# Patient Record
Sex: Female | Born: 1955 | Race: White | Hispanic: No | Smoking: Never smoker
Health system: Southern US, Community
[De-identification: ages and names within clinical notes are randomized; demographics above are authoritative.]

## PROBLEM LIST (undated history)

## (undated) DIAGNOSIS — E119 Type 2 diabetes mellitus without complications: Secondary | ICD-10-CM

## (undated) DIAGNOSIS — N189 Chronic kidney disease, unspecified: Secondary | ICD-10-CM

## (undated) DIAGNOSIS — I10 Essential (primary) hypertension: Secondary | ICD-10-CM

## (undated) HISTORY — PX: EYE SURGERY: SHX253

---

## 2011-09-02 ENCOUNTER — Encounter: Payer: Self-pay | Admitting: Cardiothoracic Surgery

## 2011-09-02 ENCOUNTER — Encounter: Payer: Self-pay | Admitting: Nurse Practitioner

## 2011-09-13 ENCOUNTER — Encounter: Payer: Self-pay | Admitting: Nurse Practitioner

## 2011-09-13 ENCOUNTER — Encounter: Payer: Self-pay | Admitting: Cardiothoracic Surgery

## 2011-10-14 ENCOUNTER — Encounter: Payer: Self-pay | Admitting: Nurse Practitioner

## 2011-10-14 ENCOUNTER — Encounter: Payer: Self-pay | Admitting: Cardiothoracic Surgery

## 2011-10-28 ENCOUNTER — Inpatient Hospital Stay: Payer: Self-pay | Admitting: Internal Medicine

## 2011-10-28 LAB — BASIC METABOLIC PANEL
Anion Gap: 18 — ABNORMAL HIGH (ref 7–16)
Anion Gap: 18 — ABNORMAL HIGH (ref 7–16)
Anion Gap: 18 — ABNORMAL HIGH (ref 7–16)
Anion Gap: 18 — ABNORMAL HIGH (ref 7–16)
BUN: 105 mg/dL — ABNORMAL HIGH (ref 7–18)
BUN: 89 mg/dL — ABNORMAL HIGH (ref 7–18)
Calcium, Total: 7.5 mg/dL — ABNORMAL LOW (ref 8.5–10.1)
Calcium, Total: 7.1 mg/dL — ABNORMAL LOW (ref 8.5–10.1)
Calcium, Total: 7.2 mg/dL — ABNORMAL LOW (ref 8.5–10.1)
Calcium, Total: 7.4 mg/dL — ABNORMAL LOW (ref 8.5–10.1)
Chloride: 115 mmol/L — ABNORMAL HIGH (ref 98–107)
Chloride: 110 mmol/L — ABNORMAL HIGH (ref 98–107)
Co2: 9 mmol/L — CL (ref 21–32)
Co2: 10 mmol/L — CL (ref 21–32)
Co2: 10 mmol/L — CL (ref 21–32)
Creatinine: 6.89 mg/dL — ABNORMAL HIGH (ref 0.60–1.30)
EGFR (African American): 7 — ABNORMAL LOW
EGFR (African American): 7 — ABNORMAL LOW
EGFR (Non-African Amer.): 5 — ABNORMAL LOW
EGFR (Non-African Amer.): 6 — ABNORMAL LOW
EGFR (Non-African Amer.): 6 — ABNORMAL LOW
EGFR (Non-African Amer.): 6 — ABNORMAL LOW
Glucose: 117 mg/dL — ABNORMAL HIGH (ref 65–99)
Glucose: 126 mg/dL — ABNORMAL HIGH (ref 65–99)
Osmolality: 311 (ref 275–301)
Osmolality: 312 (ref 275–301)
Potassium: 7.6 mmol/L (ref 3.5–5.1)
Potassium: 6 mmol/L — ABNORMAL HIGH (ref 3.5–5.1)
Potassium: 5.5 mmol/L — ABNORMAL HIGH (ref 3.5–5.1)
Potassium: 5.6 mmol/L — ABNORMAL HIGH (ref 3.5–5.1)
Sodium: 140 mmol/L (ref 136–145)
Sodium: 137 mmol/L (ref 136–145)

## 2011-10-28 LAB — URINALYSIS, COMPLETE
Bacteria: NONE SEEN
Glucose,UR: 50 mg/dL (ref 0–75)
Nitrite: NEGATIVE
Ph: 5 (ref 4.5–8.0)
Protein: 30
RBC,UR: <1 /HPF (ref 0–5)
Squamous Epithelial: NONE SEEN

## 2011-10-28 LAB — TROPONIN I: Troponin-I: <0.02 ng/mL

## 2011-10-28 LAB — CBC
HCT: 37.7 % (ref 35.0–47.0)
HGB: 11.5 g/dL — ABNORMAL LOW (ref 12.0–16.0)
MCH: 28.3 pg (ref 26.0–34.0)
MCHC: 30.6 g/dL — ABNORMAL LOW (ref 32.0–36.0)
MCV: 93 fL (ref 80–100)
Platelet: 413 10*3/uL (ref 150–440)
RBC: 4.07 10*6/uL (ref 3.80–5.20)
RDW: 14.8 % — ABNORMAL HIGH (ref 11.5–14.5)
WBC: 15.5 10*3/uL — ABNORMAL HIGH (ref 3.6–11.0)

## 2011-10-28 LAB — COMPREHENSIVE METABOLIC PANEL
Albumin: 3 g/dL — ABNORMAL LOW (ref 3.4–5.0)
Alkaline Phosphatase: 101 U/L (ref 50–136)
Bilirubin,Total: 0.2 mg/dL (ref 0.2–1.0)
Calcium, Total: 8 mg/dL — ABNORMAL LOW (ref 8.5–10.1)
Chloride: 111 mmol/L — ABNORMAL HIGH (ref 98–107)
Creatinine: 8.51 mg/dL — ABNORMAL HIGH (ref 0.60–1.30)
EGFR (African American): 5 — ABNORMAL LOW
EGFR (Non-African Amer.): 5 — ABNORMAL LOW
Glucose: 131 mg/dL — ABNORMAL HIGH (ref 65–99)
Potassium: 7.8 mmol/L (ref 3.5–5.1)
SGPT (ALT): 13 U/L
Sodium: 138 mmol/L (ref 136–145)
Total Protein: 7.5 g/dL (ref 6.4–8.2)

## 2011-10-28 LAB — WOUND CULTURE

## 2011-10-29 LAB — BASIC METABOLIC PANEL
Anion Gap: 17 — ABNORMAL HIGH (ref 7–16)
Anion Gap: 19 — ABNORMAL HIGH (ref 7–16)
BUN: 76 mg/dL — ABNORMAL HIGH (ref 7–18)
BUN: 78 mg/dL — ABNORMAL HIGH (ref 7–18)
BUN: 79 mg/dL — ABNORMAL HIGH (ref 7–18)
BUN: 82 mg/dL — ABNORMAL HIGH (ref 7–18)
BUN: 85 mg/dL — ABNORMAL HIGH (ref 7–18)
Calcium, Total: 7.1 mg/dL — ABNORMAL LOW (ref 8.5–10.1)
Calcium, Total: 7.2 mg/dL — ABNORMAL LOW (ref 8.5–10.1)
Calcium, Total: 7.3 mg/dL — ABNORMAL LOW (ref 8.5–10.1)
Calcium, Total: 7.3 mg/dL — ABNORMAL LOW (ref 8.5–10.1)
Calcium, Total: 7.3 mg/dL — ABNORMAL LOW (ref 8.5–10.1)
Chloride: 110 mmol/L — ABNORMAL HIGH (ref 98–107)
Chloride: 111 mmol/L — ABNORMAL HIGH (ref 98–107)
Chloride: 112 mmol/L — ABNORMAL HIGH (ref 98–107)
Chloride: 112 mmol/L — ABNORMAL HIGH (ref 98–107)
Chloride: 113 mmol/L — ABNORMAL HIGH (ref 98–107)
Co2: 11 mmol/L — ABNORMAL LOW (ref 21–32)
Co2: 12 mmol/L — ABNORMAL LOW (ref 21–32)
Co2: 13 mmol/L — ABNORMAL LOW (ref 21–32)
Co2: 14 mmol/L — ABNORMAL LOW (ref 21–32)
Creatinine: 5.17 mg/dL — ABNORMAL HIGH (ref 0.60–1.30)
Creatinine: 5.77 mg/dL — ABNORMAL HIGH (ref 0.60–1.30)
Creatinine: 6.08 mg/dL — ABNORMAL HIGH (ref 0.60–1.30)
Creatinine: 6.29 mg/dL — ABNORMAL HIGH (ref 0.60–1.30)
EGFR (African American): 10 — ABNORMAL LOW
EGFR (African American): 8 — ABNORMAL LOW
EGFR (African American): 8 — ABNORMAL LOW
EGFR (African American): 8 — ABNORMAL LOW
EGFR (African American): 9 — ABNORMAL LOW
EGFR (Non-African Amer.): 8 — ABNORMAL LOW
EGFR (Non-African Amer.): 8 — ABNORMAL LOW
EGFR (Non-African Amer.): 7 — ABNORMAL LOW
Glucose: 147 mg/dL — ABNORMAL HIGH (ref 65–99)
Glucose: 171 mg/dL — ABNORMAL HIGH (ref 65–99)
Glucose: 182 mg/dL — ABNORMAL HIGH (ref 65–99)
Glucose: 202 mg/dL — ABNORMAL HIGH (ref 65–99)
Osmolality: 305 (ref 275–301)
Osmolality: 306 (ref 275–301)
Osmolality: 306 (ref 275–301)
Osmolality: 308 (ref 275–301)
Osmolality: 310 (ref 275–301)
Osmolality: 312 (ref 275–301)
Potassium: 4.3 mmol/L (ref 3.5–5.1)
Potassium: 4.3 mmol/L (ref 3.5–5.1)
Potassium: 4.5 mmol/L (ref 3.5–5.1)
Potassium: 4.6 mmol/L (ref 3.5–5.1)
Potassium: 4.9 mmol/L (ref 3.5–5.1)
Sodium: 141 mmol/L (ref 136–145)
Sodium: 142 mmol/L (ref 136–145)
Sodium: 139 mmol/L (ref 136–145)
Sodium: 141 mmol/L (ref 136–145)
Sodium: 140 mmol/L (ref 136–145)
Sodium: 142 mmol/L (ref 136–145)

## 2011-10-29 LAB — PROTEIN / CREATININE RATIO, URINE
Creatinine, Urine: 93.8 mg/dL (ref 30.0–125.0)
Protein/Creat. Ratio: 864 mg/gCREAT — ABNORMAL HIGH (ref 0–200)

## 2011-10-29 LAB — CBC WITH DIFFERENTIAL/PLATELET
Basophil %: 0.1 %
Eosinophil #: 0 10*3/uL (ref 0.0–0.7)
Eosinophil %: 0.1 %
HCT: 33 % — ABNORMAL LOW (ref 35.0–47.0)
HGB: 10.3 g/dL — ABNORMAL LOW (ref 12.0–16.0)
Lymphocyte %: 4.4 %
MCV: 90 fL (ref 80–100)
Monocyte #: 1.3 x10 3/mm — ABNORMAL HIGH (ref 0.2–0.9)
Monocyte %: 7 %
Neutrophil #: 17 10*3/uL — ABNORMAL HIGH (ref 1.4–6.5)
RDW: 14.2 % (ref 11.5–14.5)
WBC: 19.2 10*3/uL — ABNORMAL HIGH (ref 3.6–11.0)

## 2011-10-29 LAB — URINALYSIS, COMPLETE
Glucose,UR: NEGATIVE mg/dL (ref 0–75)
Nitrite: NEGATIVE
Ph: 5 (ref 4.5–8.0)
Protein: 30
RBC,UR: 8 /HPF (ref 0–5)
Specific Gravity: 1.012 (ref 1.003–1.030)

## 2011-10-29 LAB — LIPID PANEL
Cholesterol: 125 mg/dL (ref 0–200)
HDL Cholesterol: 20 mg/dL — ABNORMAL LOW (ref 40–60)
Ldl Cholesterol, Calc: 69 mg/dL (ref 0–100)

## 2011-10-29 LAB — TROPONIN I: Troponin-I: <0.02 ng/mL

## 2011-10-30 LAB — BASIC METABOLIC PANEL
Anion Gap: 10 (ref 7–16)
BUN: 59 mg/dL — ABNORMAL HIGH (ref 7–18)
BUN: 38 mg/dL — ABNORMAL HIGH (ref 7–18)
BUN: 32 mg/dL — ABNORMAL HIGH (ref 7–18)
BUN: 31 mg/dL — ABNORMAL HIGH (ref 7–18)
Calcium, Total: 7.3 mg/dL — ABNORMAL LOW (ref 8.5–10.1)
Calcium, Total: 7.6 mg/dL — ABNORMAL LOW (ref 8.5–10.1)
Calcium, Total: 7.8 mg/dL — ABNORMAL LOW (ref 8.5–10.1)
Calcium, Total: 7.5 mg/dL — ABNORMAL LOW (ref 8.5–10.1)
Chloride: 108 mmol/L — ABNORMAL HIGH (ref 98–107)
Chloride: 103 mmol/L (ref 98–107)
Chloride: 104 mmol/L (ref 98–107)
Chloride: 106 mmol/L (ref 98–107)
Chloride: 108 mmol/L — ABNORMAL HIGH (ref 98–107)
Co2: 22 mmol/L (ref 21–32)
Co2: 24 mmol/L (ref 21–32)
Co2: 27 mmol/L (ref 21–32)
Co2: 27 mmol/L (ref 21–32)
Creatinine: 4.97 mg/dL — ABNORMAL HIGH (ref 0.60–1.30)
Creatinine: 3.65 mg/dL — ABNORMAL HIGH (ref 0.60–1.30)
Creatinine: 3.08 mg/dL — ABNORMAL HIGH (ref 0.60–1.30)
Creatinine: 3.03 mg/dL — ABNORMAL HIGH (ref 0.60–1.30)
EGFR (African American): 10 — ABNORMAL LOW
EGFR (African American): 13 — ABNORMAL LOW
EGFR (African American): 15 — ABNORMAL LOW
EGFR (African American): 16 — ABNORMAL LOW
EGFR (African American): 19 — ABNORMAL LOW
EGFR (African American): 19 — ABNORMAL LOW
EGFR (Non-African Amer.): 11 — ABNORMAL LOW
EGFR (Non-African Amer.): 14 — ABNORMAL LOW
EGFR (Non-African Amer.): 16 — ABNORMAL LOW
EGFR (Non-African Amer.): 16 — ABNORMAL LOW
Glucose: 201 mg/dL — ABNORMAL HIGH (ref 65–99)
Glucose: 108 mg/dL — ABNORMAL HIGH (ref 65–99)
Glucose: 140 mg/dL — ABNORMAL HIGH (ref 65–99)
Osmolality: 306 (ref 275–301)
Osmolality: 291 (ref 275–301)
Osmolality: 291 (ref 275–301)
Osmolality: 289 (ref 275–301)
Potassium: 4.2 mmol/L (ref 3.5–5.1)
Potassium: 4 mmol/L (ref 3.5–5.1)
Sodium: 142 mmol/L (ref 136–145)
Sodium: 141 mmol/L (ref 136–145)

## 2011-10-30 LAB — CBC WITH DIFFERENTIAL/PLATELET
Basophil %: 0.7 %
Eosinophil #: 0.1 10*3/uL (ref 0.0–0.7)
HCT: 29.8 % — ABNORMAL LOW (ref 35.0–47.0)
Lymphocyte #: 1.9 10*3/uL (ref 1.0–3.6)
MCH: 28.4 pg (ref 26.0–34.0)
Monocyte #: 1.6 x10 3/mm — ABNORMAL HIGH (ref 0.2–0.9)
Neutrophil %: 74.4 %
Platelet: 244 10*3/uL (ref 150–440)
WBC: 14.5 10*3/uL — ABNORMAL HIGH (ref 3.6–11.0)

## 2011-10-30 LAB — URINE CULTURE

## 2011-10-30 LAB — VANCOMYCIN, TROUGH: Vancomycin, Trough: 12 ug/mL (ref 10–20)

## 2011-10-30 LAB — PROTEIN ELECTROPHORESIS(ARMC)

## 2011-10-31 LAB — BASIC METABOLIC PANEL
Anion Gap: 7 (ref 7–16)
Anion Gap: 9 (ref 7–16)
BUN: 29 mg/dL — ABNORMAL HIGH (ref 7–18)
BUN: 34 mg/dL — ABNORMAL HIGH (ref 7–18)
Calcium, Total: 7.6 mg/dL — ABNORMAL LOW (ref 8.5–10.1)
Calcium, Total: 7.8 mg/dL — ABNORMAL LOW (ref 8.5–10.1)
Calcium, Total: 7.7 mg/dL — ABNORMAL LOW (ref 8.5–10.1)
Calcium, Total: 7.4 mg/dL — ABNORMAL LOW (ref 8.5–10.1)
Chloride: 106 mmol/L (ref 98–107)
Chloride: 106 mmol/L (ref 98–107)
Co2: 26 mmol/L (ref 21–32)
Co2: 26 mmol/L (ref 21–32)
Creatinine: 3.08 mg/dL — ABNORMAL HIGH (ref 0.60–1.30)
Creatinine: 3.05 mg/dL — ABNORMAL HIGH (ref 0.60–1.30)
Creatinine: 3.33 mg/dL — ABNORMAL HIGH (ref 0.60–1.30)
Creatinine: 3.71 mg/dL — ABNORMAL HIGH (ref 0.60–1.30)
EGFR (African American): 19 — ABNORMAL LOW
EGFR (African American): 17 — ABNORMAL LOW
EGFR (African American): 15 — ABNORMAL LOW
EGFR (Non-African Amer.): 16 — ABNORMAL LOW
EGFR (Non-African Amer.): 15 — ABNORMAL LOW
EGFR (Non-African Amer.): 13 — ABNORMAL LOW
Glucose: 109 mg/dL — ABNORMAL HIGH (ref 65–99)
Glucose: 97 mg/dL (ref 65–99)
Osmolality: 288 (ref 275–301)
Osmolality: 295 (ref 275–301)
Potassium: 3.8 mmol/L (ref 3.5–5.1)
Potassium: 4 mmol/L (ref 3.5–5.1)
Potassium: 4.1 mmol/L (ref 3.5–5.1)
Sodium: 140 mmol/L (ref 136–145)
Sodium: 140 mmol/L (ref 136–145)
Sodium: 141 mmol/L (ref 136–145)

## 2011-10-31 LAB — CBC WITH DIFFERENTIAL/PLATELET
Basophil #: 0.1 10*3/uL (ref 0.0–0.1)
Basophil %: 0.7 %
Eosinophil #: 0.2 10*3/uL (ref 0.0–0.7)
Eosinophil %: 1.4 %
HCT: 26.7 % — ABNORMAL LOW (ref 35.0–47.0)
Lymphocyte #: 2.5 10*3/uL (ref 1.0–3.6)
MCHC: 32.1 g/dL (ref 32.0–36.0)
MCV: 88 fL (ref 80–100)
Monocyte %: 10.8 %
Neutrophil #: 8.3 10*3/uL — ABNORMAL HIGH (ref 1.4–6.5)
Neutrophil %: 67 %
RDW: 14.2 % (ref 11.5–14.5)
WBC: 12.4 10*3/uL — ABNORMAL HIGH (ref 3.6–11.0)

## 2011-10-31 LAB — UR PROT ELECTROPHORESIS, URINE RANDOM

## 2011-11-01 LAB — CBC WITH DIFFERENTIAL/PLATELET
Basophil #: 0.1 10*3/uL (ref 0.0–0.1)
Eosinophil %: 3.4 %
Lymphocyte #: 2.9 10*3/uL (ref 1.0–3.6)
Lymphocyte %: 22.4 %
MCH: 28.1 pg (ref 26.0–34.0)
MCHC: 31.7 g/dL — ABNORMAL LOW (ref 32.0–36.0)
MCV: 89 fL (ref 80–100)
Monocyte #: 1.5 x10 3/mm — ABNORMAL HIGH (ref 0.2–0.9)
Neutrophil %: 61.8 %
RBC: 2.86 10*6/uL — ABNORMAL LOW (ref 3.80–5.20)
RDW: 14.2 % (ref 11.5–14.5)
WBC: 13 10*3/uL — ABNORMAL HIGH (ref 3.6–11.0)

## 2011-11-01 LAB — BASIC METABOLIC PANEL
BUN: 36 mg/dL — ABNORMAL HIGH (ref 7–18)
Calcium, Total: 7.7 mg/dL — ABNORMAL LOW (ref 8.5–10.1)
Chloride: 107 mmol/L (ref 98–107)
Co2: 25 mmol/L (ref 21–32)
Creatinine: 4.22 mg/dL — ABNORMAL HIGH (ref 0.60–1.30)
EGFR (Non-African Amer.): 11 — ABNORMAL LOW
Osmolality: 293 (ref 275–301)

## 2011-11-01 LAB — PHOSPHORUS: Phosphorus: 4.5 mg/dL (ref 2.5–4.9)

## 2011-11-02 LAB — BASIC METABOLIC PANEL
Anion Gap: 10 (ref 7–16)
BUN: 23 mg/dL — ABNORMAL HIGH (ref 7–18)
Calcium, Total: 7.4 mg/dL — ABNORMAL LOW (ref 8.5–10.1)
Chloride: 103 mmol/L (ref 98–107)
Co2: 28 mmol/L (ref 21–32)
Glucose: 119 mg/dL — ABNORMAL HIGH (ref 65–99)
Osmolality: 286 (ref 275–301)
Sodium: 141 mmol/L (ref 136–145)

## 2011-11-02 LAB — OCCULT BLOOD X 1 CARD TO LAB, STOOL: Occult Blood, Feces: NEGATIVE

## 2011-11-02 LAB — CBC WITH DIFFERENTIAL/PLATELET
Basophil #: 0.1 10*3/uL (ref 0.0–0.1)
Eosinophil #: 0.6 10*3/uL (ref 0.0–0.7)
Eosinophil %: 4.8 %
HCT: 24.9 % — ABNORMAL LOW (ref 35.0–47.0)
HGB: 7.9 g/dL — ABNORMAL LOW (ref 12.0–16.0)
Lymphocyte %: 18.5 %
MCV: 89 fL (ref 80–100)
Monocyte #: 1.4 x10 3/mm — ABNORMAL HIGH (ref 0.2–0.9)
Monocyte %: 10.7 %
Neutrophil #: 8.3 10*3/uL — ABNORMAL HIGH (ref 1.4–6.5)
Platelet: 208 10*3/uL (ref 150–440)
RDW: 13.4 % (ref 11.5–14.5)

## 2011-11-03 LAB — BASIC METABOLIC PANEL
Anion Gap: 9 (ref 7–16)
Anion Gap: 10 (ref 7–16)
Calcium, Total: 7.6 mg/dL — ABNORMAL LOW (ref 8.5–10.1)
Calcium, Total: 8 mg/dL — ABNORMAL LOW (ref 8.5–10.1)
Chloride: 103 mmol/L (ref 98–107)
Chloride: 103 mmol/L (ref 98–107)
Co2: 28 mmol/L (ref 21–32)
Creatinine: 5.04 mg/dL — ABNORMAL HIGH (ref 0.60–1.30)
EGFR (Non-African Amer.): 10 — ABNORMAL LOW
Glucose: 118 mg/dL — ABNORMAL HIGH (ref 65–99)
Glucose: 179 mg/dL — ABNORMAL HIGH (ref 65–99)
Potassium: 3.5 mmol/L (ref 3.5–5.1)
Potassium: 3.9 mmol/L (ref 3.5–5.1)

## 2011-11-03 LAB — CULTURE, BLOOD (SINGLE)

## 2011-11-03 LAB — HEMOGLOBIN: HGB: 7.5 g/dL — ABNORMAL LOW (ref 12.0–16.0)

## 2011-11-04 LAB — BASIC METABOLIC PANEL
BUN: 33 mg/dL — ABNORMAL HIGH (ref 7–18)
Chloride: 103 mmol/L (ref 98–107)
Glucose: 117 mg/dL — ABNORMAL HIGH (ref 65–99)
Osmolality: 290 (ref 275–301)
Potassium: 3.5 mmol/L (ref 3.5–5.1)
Sodium: 141 mmol/L (ref 136–145)

## 2011-11-13 ENCOUNTER — Encounter: Payer: Self-pay | Admitting: Nurse Practitioner

## 2011-11-13 ENCOUNTER — Encounter: Payer: Self-pay | Admitting: Cardiothoracic Surgery

## 2011-12-14 ENCOUNTER — Encounter: Payer: Self-pay | Admitting: Nurse Practitioner

## 2011-12-14 ENCOUNTER — Encounter: Payer: Self-pay | Admitting: Cardiothoracic Surgery

## 2012-01-13 ENCOUNTER — Encounter: Payer: Self-pay | Admitting: Cardiothoracic Surgery

## 2012-01-13 ENCOUNTER — Encounter: Payer: Self-pay | Admitting: Nurse Practitioner

## 2013-01-12 IMAGING — US US RENAL KIDNEY
1 series · 17 of 25 positions shown · non-contrast
Comparison: None

REASON FOR EXAM: acute renal failure
COMMENTS:

PROCEDURE:     US  - US KIDNEY  - October 29, 2011  [DATE]
RESULT:     Indication: Acute renal failure

[Series 1: us renal kidney · 17 of 28 slices shown]
[im 1/28]
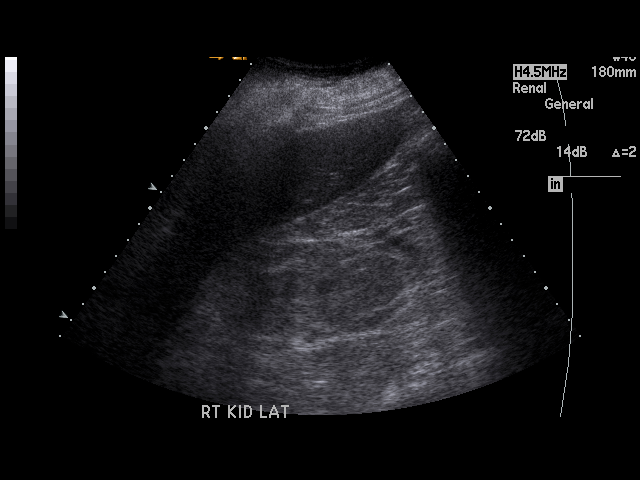
[im 3/28]
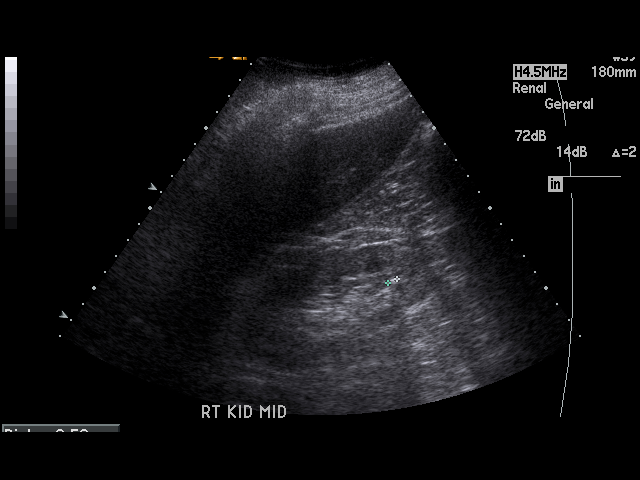
[im 4/28]
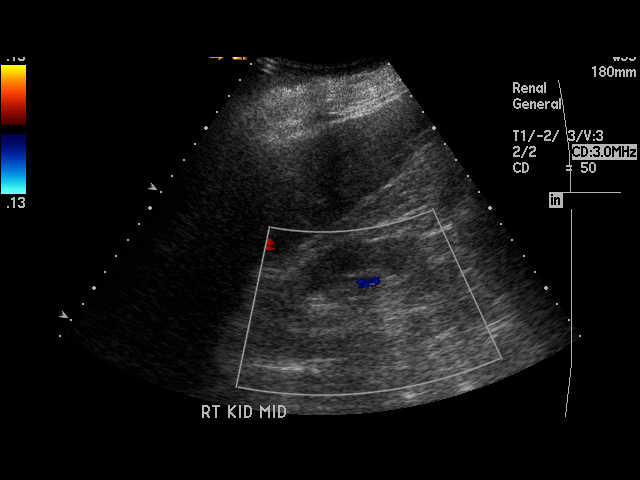
[im 6/28]
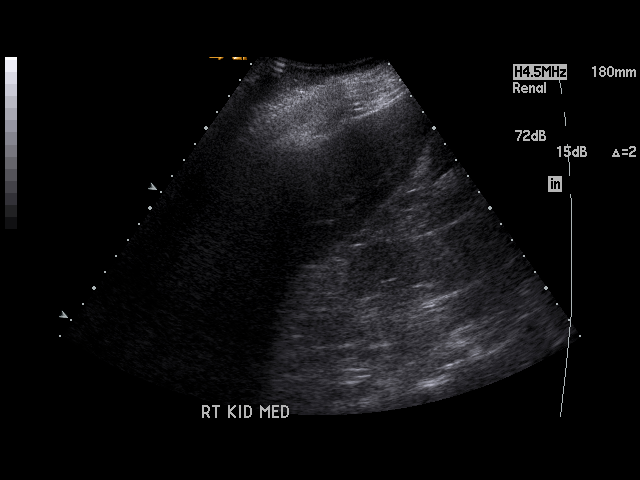
[im 7/28]
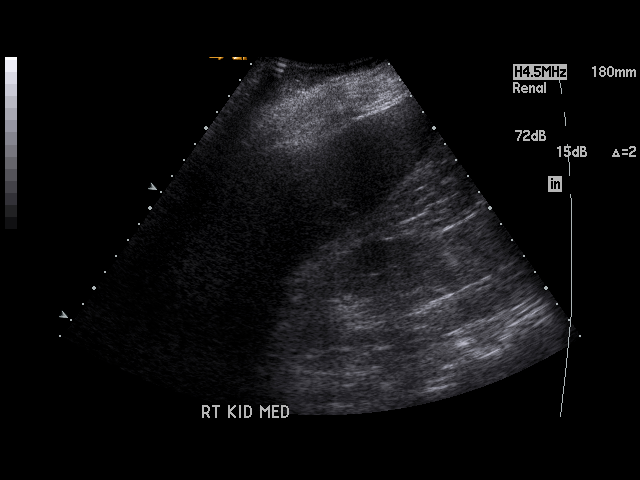
[im 10/28]
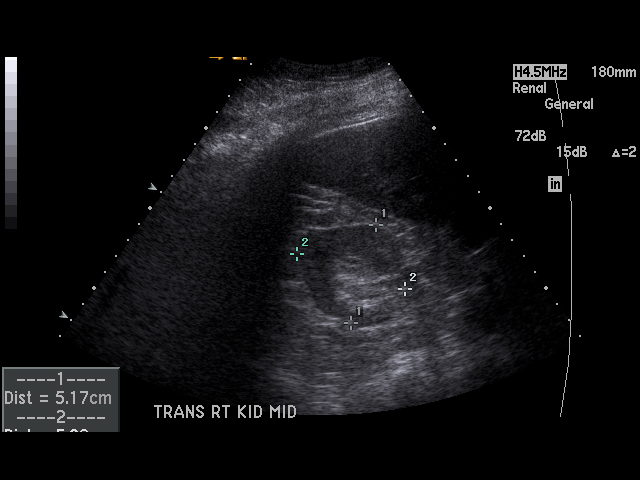
[im 11/28]
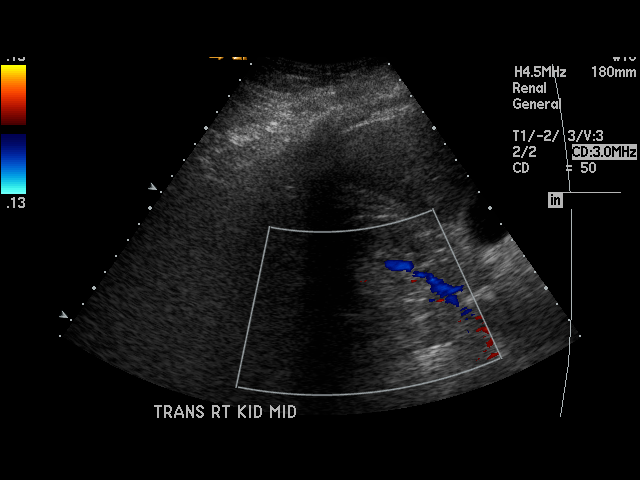
[im 13/28]
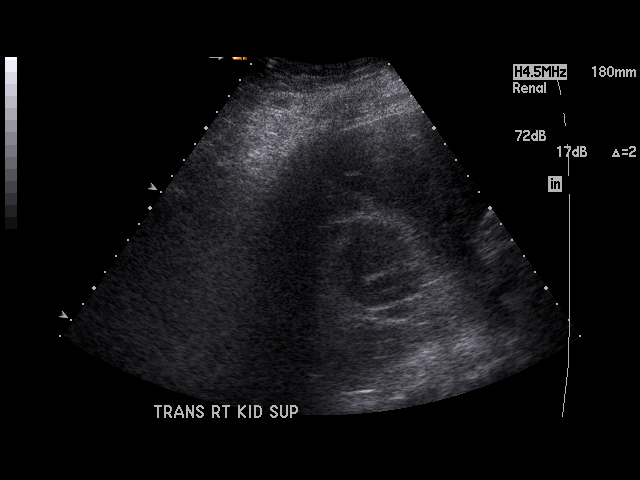
[im 14/28]
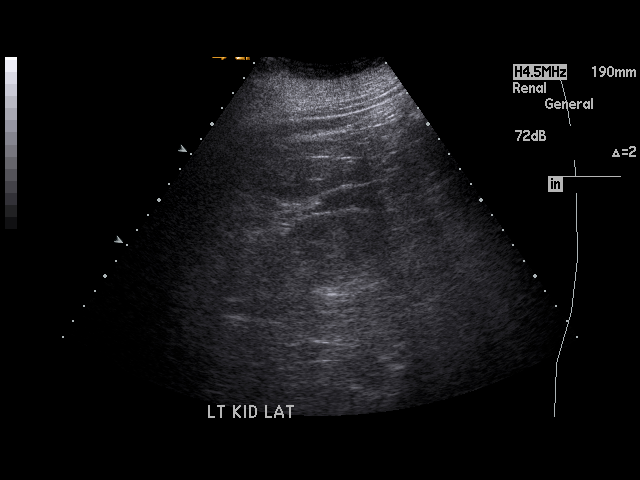
[im 15/28]
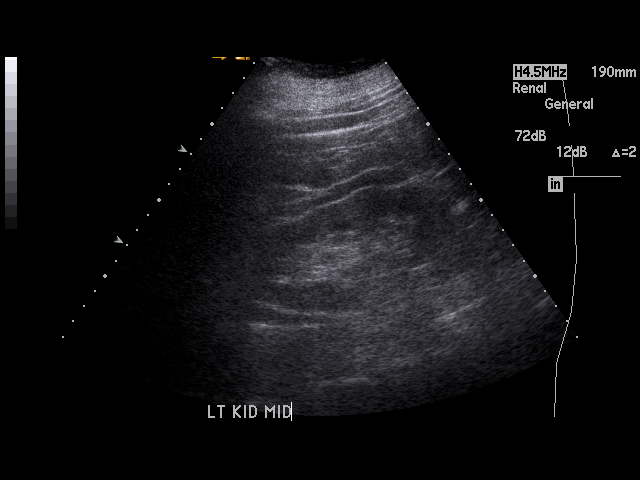
[im 17/28]
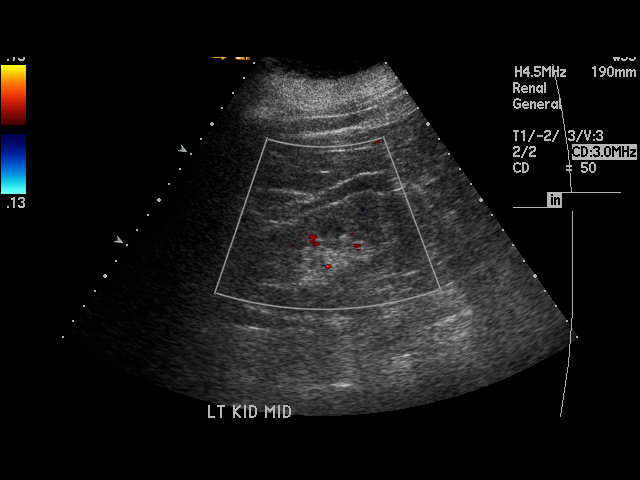
[im 19/28]
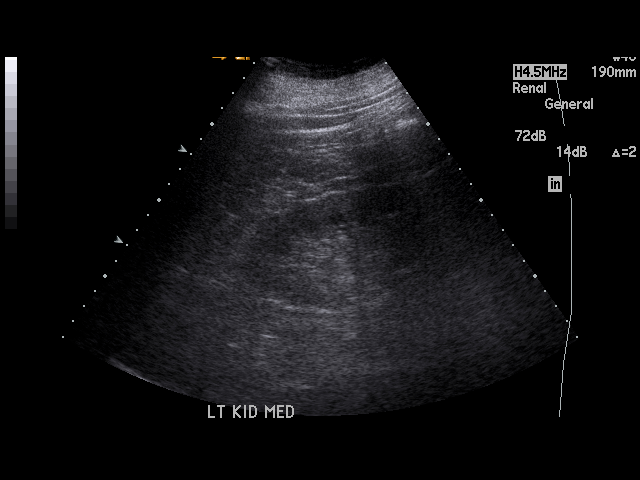
[im 21/28]
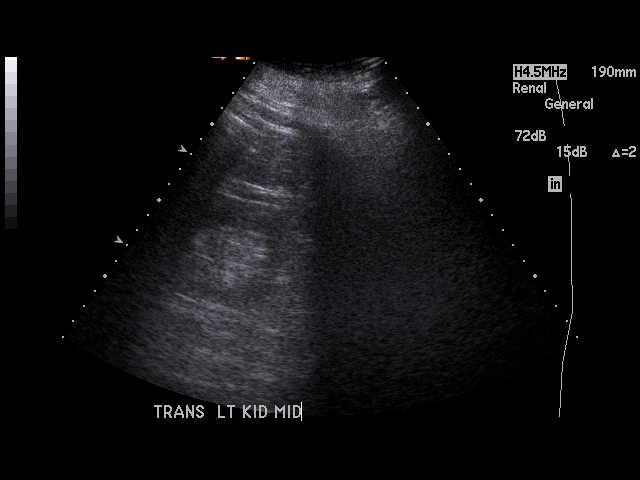
[im 22/28]
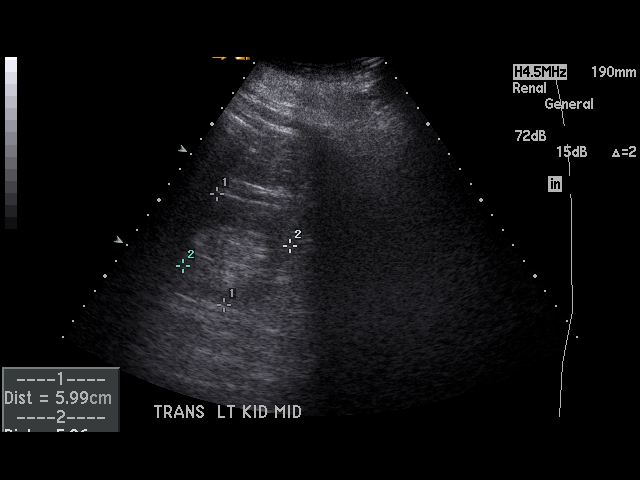
[im 24/28]
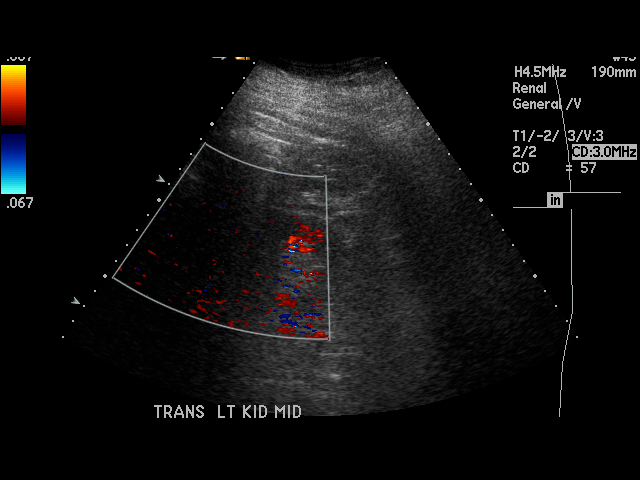
[im 25/28]
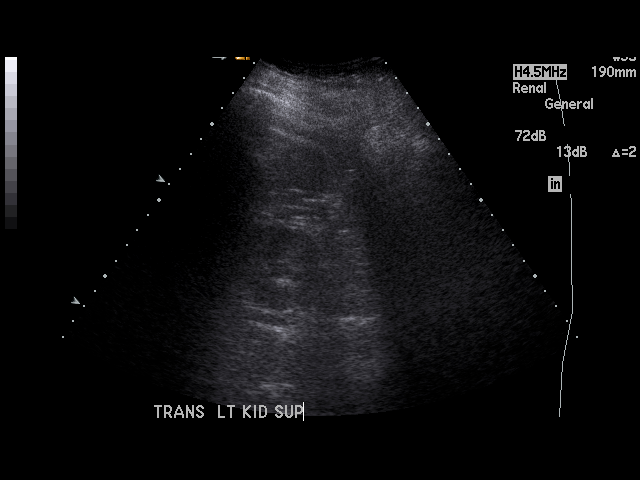
[im 28/28]
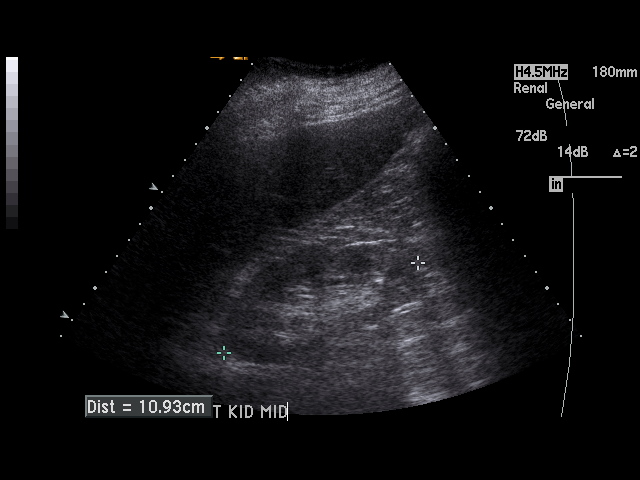

[17 of 25 positions shown; findings below may reference images not displayed]

Technique and findings: Multiple gray-scale and color doppler images of the
kidneys were obtained.

The right kidney measures 11 x 5.2 x 5.8 cm and the left kidney measures
11.6 x 6 x 5.9 cm. The kidneys are normal in echogenicity. There is no
hydronephrosis.  There are no echogenic foci.  There are no renal masses.
There is no free fluid in the region of the renal fossa.
IMPRESSION: Normal renal ultrasound.

## 2013-01-26 ENCOUNTER — Ambulatory Visit: Payer: Self-pay | Admitting: Ophthalmology

## 2013-02-01 ENCOUNTER — Ambulatory Visit: Payer: Self-pay | Admitting: Ophthalmology

## 2013-02-15 ENCOUNTER — Ambulatory Visit: Payer: Self-pay | Admitting: Ophthalmology

## 2014-04-14 DIAGNOSIS — N2581 Secondary hyperparathyroidism of renal origin: Secondary | ICD-10-CM | POA: Diagnosis not present

## 2014-04-14 DIAGNOSIS — I10 Essential (primary) hypertension: Secondary | ICD-10-CM | POA: Diagnosis not present

## 2014-04-14 DIAGNOSIS — E1122 Type 2 diabetes mellitus with diabetic chronic kidney disease: Secondary | ICD-10-CM | POA: Diagnosis not present

## 2014-04-14 DIAGNOSIS — N184 Chronic kidney disease, stage 4 (severe): Secondary | ICD-10-CM | POA: Diagnosis not present

## 2014-06-21 DIAGNOSIS — N184 Chronic kidney disease, stage 4 (severe): Secondary | ICD-10-CM | POA: Diagnosis not present

## 2014-06-21 DIAGNOSIS — E1122 Type 2 diabetes mellitus with diabetic chronic kidney disease: Secondary | ICD-10-CM | POA: Diagnosis not present

## 2014-06-21 DIAGNOSIS — I10 Essential (primary) hypertension: Secondary | ICD-10-CM | POA: Diagnosis not present

## 2014-06-21 DIAGNOSIS — N2581 Secondary hyperparathyroidism of renal origin: Secondary | ICD-10-CM | POA: Diagnosis not present

## 2014-06-21 DIAGNOSIS — E872 Acidosis: Secondary | ICD-10-CM | POA: Diagnosis not present

## 2014-08-16 DIAGNOSIS — N2581 Secondary hyperparathyroidism of renal origin: Secondary | ICD-10-CM | POA: Diagnosis not present

## 2014-08-16 DIAGNOSIS — E1122 Type 2 diabetes mellitus with diabetic chronic kidney disease: Secondary | ICD-10-CM | POA: Diagnosis not present

## 2014-08-16 DIAGNOSIS — N184 Chronic kidney disease, stage 4 (severe): Secondary | ICD-10-CM | POA: Diagnosis not present

## 2014-08-16 DIAGNOSIS — I10 Essential (primary) hypertension: Secondary | ICD-10-CM | POA: Diagnosis not present

## 2014-08-16 DIAGNOSIS — E872 Acidosis: Secondary | ICD-10-CM | POA: Diagnosis not present

## 2014-09-29 DIAGNOSIS — D631 Anemia in chronic kidney disease: Secondary | ICD-10-CM | POA: Diagnosis not present

## 2014-09-29 DIAGNOSIS — E872 Acidosis: Secondary | ICD-10-CM | POA: Diagnosis not present

## 2014-09-29 DIAGNOSIS — E1122 Type 2 diabetes mellitus with diabetic chronic kidney disease: Secondary | ICD-10-CM | POA: Diagnosis not present

## 2014-09-29 DIAGNOSIS — N2581 Secondary hyperparathyroidism of renal origin: Secondary | ICD-10-CM | POA: Diagnosis not present

## 2014-09-29 DIAGNOSIS — N185 Chronic kidney disease, stage 5: Secondary | ICD-10-CM | POA: Diagnosis not present

## 2014-11-04 NOTE — Op Note (Signed)
PATIENT NAME:  [name], [name] MR#:  [id number] DATE OF BIRTH:  [birth year]  DATE OF PROCEDURE:  02/01/2013  PREOPERATIVE DIAGNOSIS:  Cataract, right eye.   POSTOPERATIVE DIAGNOSIS:  Cataract, right eye.  PROCEDURE PERFORMED:  Extracapsular cataract extraction using phacoemulsification with placement of an Alcon SN6CWS, 12.0-diopter posterior chamber lens, serial [id number].   SURGEON:  Maylon PeppersSteven A. Marea Reasner, MD  ASSISTANT:  None.  ANESTHESIA:  4% lidocaine and 0.75% Marcaine in a 50/50 mixture with 10 units/mL of Hylenex added, given as a peribulbar.   ANESTHESIOLOGIST:  Dr. Noralyn Pickarroll.  COMPLICATIONS:  None.  ESTIMATED BLOOD LOSS:  Less than 1 ml.  DESCRIPTION OF PROCEDURE:  The patient was brought to the operating room and given a peribulbar block.  The patient was then prepped and draped in the usual fashion.  The vertical rectus muscles were imbricated using 5-0 silk sutures.  These sutures were then clamped to the sterile drapes as bridle sutures.  A limbal peritomy was performed extending two clock hours and hemostasis was obtained with cautery.  A partial thickness scleral groove was made at the surgical limbus and dissected anteriorly in a lamellar dissection using an Alcon crescent knife.  The anterior chamber was entered superonasally with a Superblade and through the lamellar dissection with a 2.6 mm keratome.  DisCoVisc was used to replace the aqueous and a continuous tear capsulorrhexis was carried out.  Hydrodissection and hydrodelineation were carried out with balanced salt and a 27 gauge canula.  The nucleus was rotated to confirm the effectiveness of the hydrodissection.  Phacoemulsification was carried out using a divide-and-conquer technique.  Total ultrasound time was 1 minutes and 29.2 seconds with an average power of 25.2 percent and CDE of 39.40.  Irrigation/aspiration was used to remove the residual cortex.  DisCoVisc was used to inflate the capsule and the  internal incision was enlarged to 3 mm with the crescent knife.  The intraocular lens was folded and inserted into the capsular bag using the AcrySert delivery system.  Irrigation/aspiration was used to remove the residual DisCoVisc.  Miostat was injected into the anterior chamber through the paracentesis track to inflate the anterior chamber and induce miosis.  A tenth of a milliliter of cefuroxime was placed into the anterior chamber via the paracentesis tract. The wound was checked for leaks and none were found. The conjunctiva was closed with cautery and the bridle sutures were removed.  Two drops of 0.3% Vigamox were placed on the eye.   An eye shield was placed on the eye.  The patient was discharged to the recovery room in good condition. ____________________________ Maylon PeppersSteven A. Dessie Tatem, MD sad:sb D: 02/01/2013 12:18:39 ET T: 02/01/2013 13:26:47 ET JOB#: [id number]  cc: Viviann SpareSteven A. Renell Coaxum, MD, <Dictator> Erline LevineSTEVEN A Cadince Hilscher MD ELECTRONICALLY SIGNED 02/08/2013 13:31

## 2014-11-04 NOTE — Op Note (Signed)
PATIENT NAME:  [name], [name] MR#:  [id number] DATE OF BIRTH:  September 04, 1955  DATE OF PROCEDURE:  02/15/2013  PREOPERATIVE DIAGNOSIS: Cataract, left eye.   POSTOPERATIVE DIAGNOSIS: Cataract, left eye.   PROCEDURE PERFORMED: Extracapsular cataract extraction using phacoemulsification with placement of an Alcon SN6CWS 13.0-diopter posterior chamber lens, serial number [id number].   SURGEON: Maylon PeppersSteven A. Antawan Mchugh, M.D.   ANESTHESIA: Lidocaine 4% and 0.75% Marcaine in a 50-50 mixture with 10 units/mL of HyoMax added, given as a peribulbar.  ANESTHESIOLOGIST: Dr. Ronalee BeltsBhandari.   COMPLICATIONS: None.   ESTIMATED BLOOD LOSS: Less than 1 mL.   DESCRIPTION OF PROCEDURE:  The patient was brought to the operating room and given a peribulbar block.  The patient was then prepped and draped in the usual fashion.  The vertical rectus muscles were imbricated using 5-0 silk sutures.  These sutures were then clamped to the sterile drapes as bridle sutures.  A limbal peritomy was performed extending two clock hours and hemostasis was obtained with cautery.  A partial thickness scleral groove was made at the surgical limbus and dissected anteriorly in a lamellar dissection using an Alcon crescent knife.  The anterior chamber was entered supero-temporally with a Superblade and through the lamellar dissection with a 2.6 mm keratome.  DisCoVisc was used to replace the aqueous and a continuous tear capsulorrhexis was carried out.  Hydrodissection and hydrodelineation were carried out with balanced salt and a 27 gauge canula.  The nucleus was rotated to confirm the effectiveness of the hydrodissection.  Phacoemulsification was carried out using a divide-and-conquer technique.  Total ultrasound time was 1 minute and 42 seconds with an average power of 60.5%. CDE 41.8.  Irrigation/aspiration was used to remove the residual cortex.  DisCoVisc was used to inflate the capsule and the internal incision was enlarged to 3 mm  with the crescent knife.  The intraocular lens was folded and inserted into the capsular bag using the AcrySert delivery system.  Irrigation/aspiration was used to remove the residual DisCoVisc.  Miostat was injected into the anterior chamber through the paracentesis track to inflate the anterior chamber and induce miosis.  A tenth of an mL of cefuroxime was injected through the paracentesis track containing 1 mg of the drug. The wound was checked for leaks and none were found. The conjunctiva was closed with cautery and the bridle sutures were removed.  Two drops of 0.3% Vigamox were placed on the eye.   An eye shield was placed on the eye.  The patient was discharged to the recovery room in good condition.  ____________________________ Maylon PeppersSteven A. Emmy Keng, MD sad:aw D: 02/15/2013 11:53:59 ET T: 02/15/2013 12:44:51 ET JOB#: [id number]  cc: Viviann SpareSteven A. Vonnie Spagnolo, MD, <Dictator> Erline LevineSTEVEN A Keyera Hattabaugh MD ELECTRONICALLY SIGNED 02/15/2013 13:12

## 2014-11-06 NOTE — Discharge Summary (Signed)
PATIENT NAME:  [name], [name] MR#:  [id number] DATE OF BIRTH:  05/09/1956  DATE OF ADMISSION:  10/28/2011 DATE OF DISCHARGE:  11/04/2011  PRIMARY CARE PHYSICIAN: Iantha Fallen, MD   FINAL DIAGNOSES:  1. Shock, septic versus hypovolemic, which resolved.  2. Acute renal failure, hyperkalemia, and acidosis requiring urgent dialysis.  3. Diabetes with hypoglycemia.  4. Leukocytosis.  5. Methicillin-resistant Staphylococcus aureus lower extremity wound, decubitus on the buttocks.  6. Anemia.   DISCHARGE MEDICATIONS: 1. Prilosec 20 mg p.o. twice a day.  2. Reglan 5 mg p.o. prior to meals. 3. Doxycycline 100 mg p.o. twice a day for five more days.  4. Ferrous sulfate 325 mg p.o. daily.  5. Percocet 5/325 mg one tablet every 6 hours as needed for pain. 6. DuoDERM apply every five days to buttocks.   NOTE: Do not take metformin. Do not take lisinopril. Do not take ibuprofen. Do not take Aleve. Do not take Motrin. Do not take pseudoephedrine. Do not take Lantus.   DIET: Low sodium, 1800 ADA diet.   ACTIVITY: Activity as tolerated.   DISCHARGE FOLLOWUP: Followup with Dr. Cherylann Ratel, nephrology, on a daily basis for lab checks until further notice. Follow-up with home health PT. [facility] for the right lower extremity wounds, abdominal wound, and buttock wounds. Follow-up in 1 to 2 weeks with Dr. Iantha Fallen.   REASON FOR ADMISSION: The patient was admitted 10/28/2011 by me and discharged 11/04/2011. She came in not feeling well.   HISTORY OF PRESENT ILLNESS: This is a 59 year old female with hypertension, diabetes, and chronic wound on the leg. She presented to the emergency room with not feeling well, flulike symptoms, very weak, unable to get out of bed, lightheaded, and nauseous. Her blood pressure was found to be in the 70s. Unable to walk around very much. Not eating very much. Was taking ibuprofen for pain. In the emergency room, she was found with a pH of  6.92, acute renal failure with a creatinine of 8.51, and a potassium of 7.8. I admitted the patient with shock. IV fluid hydration. Initially started on Zyvox and Zosyn. Blood cultures were ordered. Levophed ordered. For the acute renal failure, severe acidosis, and severe hyperkalemia I contacted Dr. Cherylann Ratel to start urgent dialysis. Dr. Wyn Quaker vascular surgery put in a catheter. Continuous dialysis was set up in the Critical Care Unit. Calcium gluconate, D50, Kayexalate, and sodium bicarbonate was given. For the severe hypoglycemia, most likely secondary to the acute renal failure and taking glipizide, metformin, and Lantus with poor appetite. The patient was given D50 and D5W in the IV fluids.   LABS/STUDIES: Blood glucose fingerstick 29.   Chest x-ray: No acute disease.   Blood cultures were negative.   Troponin negative. Glucose 131 on chemistry, BUN 115, creatinine 8.51, sodium 138, potassium 7.8, chloride 111, CO2 7, and calcium 8. Liver function tests normal. White blood cell count 15.5, hemoglobin and hematocrit 11.5 and 37.7, and platelet count 413.   EKG showed low voltage, 94 beats per minute, nonspecific ST-T wave changes.   ABG showed a pH of 6.92, pCO2 31, pO2 113, and lactic acid 3.9.   Urine culture was negative.   Urinalysis: 1+ leukocyte esterase.   Creatinine on 10/28/2011 was 6.62. Creatinine on 10/29/2011 was 6.08.  LDL 69, HDL 20, and triglycerides 045. Creatinine on 10/29/2011 was 5.77.   Ultrasound of the kidneys: Normal renal ultrasound.  Protein electrophoresis - albumin low at 2.6 and total protein 5.4.  Complement C4 at 22. Complement C3 low at 75. ANCA panel negative. ANA negative.   Repeat ABG on 10/29/2011 showed a pH of 7.22, pCO2 29, and pO2 72; that was on room air. Oxygen saturation 90.4 and bicarbonate 11.9.   Urine protein electrophoresis showed urine total protein high at 45.6. Creatinine on 10/30/2011 was 4.12 and later on 10/30/2011 was 3.65 and went down  to 3.03. On 10/31/2011 it was 3.05. Hepatitis B surface antigen negative. Creatinine on 09-04-2011 was 4.22. Creatinine on 11/02/2011 was 3.47. Occult blood was negative. Creatinine on 11/03/2011 was 4.71. Creatinine on 11/04/2011 was 5.32. Last hemoglobin was 7.6   CONSULTANTS: 1. Festus Barren, MD - Vascular Surgery. 2. Orson Aloe, MD - Infectious Disease. 3. Mady Haagensen, MD - Nephrology. 4. Physical Therapy.   HOSPITAL COURSE PER PROBLEM LIST:  1. For the patient's shock, either septic versus hypovolemic, the patient was given vigorous IV fluid hydration, started on Levophed drip. Blood pressure did improve during the hospital stay. The patient was kept off her antihypertensive medications. Blood pressure upon discharge was 141/88, but earlier in the day was 104/69.  2. For the patient's acute renal failure, hyperkalemia, and acidosis that combination made the patient a candidate for acute dialysis on presentation. The patient was started on continuous dialysis throughout the initial part of her hospital course, followed by Dr. Cherylann Ratel of nephrology. Creatinine did improve a little bit with the dialysis, but only as low as the 3 range. Once the dialysis was stopped and catheter removed, the creatinine upon discharge did creep up to 5.32. The patient was making good urine. Potassium was 3.5. Acidosis had improved. The patient was deemed stable for discharge home, but daily creatinines needed through Dr. Garnett Farm office to see if the patient will end up needing dialysis in the future. The cause was probably multifactorial, being on Glucophage, lisinopril, and taking ibuprofen. Unknown what the baseline creatinine is. Acidosis did improve with dialysis. Potassium was treated initially with D50, bicarbonate, calcium gluconate, and Kayexalate, and improved with dialysis.  3. Diabetes with hypoglycemia. Sugar on presentation was 29, most likely with the acute renal failure and taking her glipizide,  Glucophage, and Lantus. The patient will be kept off all diabetic medications at this time. Still concern with the renal failure. If sugars go above 200, can restart Lantus 4 units at bedtime.  4. For the patient's leukocytosis, that did improve with initial antibiotics.  5. For the MRSA on the lower extremity wound, initially the patient was put on vancomycin and Zosyn and then switched over to doxycycline, and that ill be completed for another five days.  6. For the patient's decubitus on the buttock, present on admission, DuoDERM applied. The patient does have two scabs on the lower abdomen also. One may have been pressure from the catheter in the groin. The [facility] for all these wounds.  7. For the patient's anemia, the patient was given Procrit and two doses Venofer during the hospital stay and iron upon discharge. She did not want a blood transfusion if at all possible. Since the patient was asymptomatic, she was sent home.   Close clinical follow-up with Dr. Cherylann Ratel, nephrology, on a daily basis for labs will be needed to see whether the patient's creatinine improves or whether the patient will need dialysis. This was explained to the patient and family. Dr. Thedore Mins was okay with discharging the patient today.   TIME SPENT ON DISCHARGE: 40 minutes.  ____________________________ Herschell Dimesichard J. Renae GlossWieting, MD rjw:slb D: 11/04/2011 17:13:31 ET T: 11/05/2011 10:08:12 ET JOB#: [id number]  cc: Herschell Dimesichard J. Renae GlossWieting, MD, <Dictator> Munsoor Cherylann RatelLateef, MD Iantha FallenLeonard Polanco, MD Salley ScarletICHARD J Leandria Thier MD ELECTRONICALLY SIGNED 11/07/2011 17:29

## 2014-11-06 NOTE — Consult Note (Signed)
PATIENT NAME:  [name], [name] MR#:  [id number] DATE OF BIRTH:  September 22, 1955  DATE OF CONSULTATION:  10/28/2011  REFERRING PHYSICIAN:  Alford Highlandichard Wieting, MD CONSULTING PHYSICIAN:  Rosalyn GessMichael E. Jentry Mcqueary, MD  REASON FOR CONSULTATION: Possible sepsis and acute renal failure.   HISTORY OF PRESENT ILLNESS: The patient is a 59 year old white female with a past history significant for diabetes and recurrent MRSA skin lesions who was admitted today with generalized malaise, nauseousness, and lightheadedness. She was also feeling very weak. She is a little unclear on how long she has been feeling poorly. At one point she indicated that it had been going on for several days, however, the History and Physical indicates it was fairly acute. Her blood pressure was noted to be low and EMS was called. In the past, she has been on antibiotics for recurrent skin lesions. She had a recent wound culture which grew MRSA and enterococcus, on 10/23/2011. She was unable to tell me if she was treated specifically for this. Per the History and Physical, she was treated with an Radio broadcast assistantUnna boot. On admission, she was found to be in acute renal failure. She could not to provide significant additional history of the recent events. Although she did not recall any fevers, chills, or sweats, she did state she had some nausea, but no change in her bowels. Her skin lesions have been recurrent.  DRUG ALLERGIES: Vicodin.  PAST MEDICAL HISTORY:  1. Diabetes.  2. Hypertension.  3. Recurrent MRSA lesions.   SOCIAL HISTORY: She lives with her son. She does not smoke, although she did many years ago. No alcohol. No injecting drug use.   FAMILY HISTORY: Positive for chronic obstructive pulmonary disease and cancer.   REVIEW OF SYSTEMS: CONSTITUTIONAL: Poor historian but she denies any fevers, chills, or sweats. Positive general malaise. HEENT: No headaches. No sinus congestion. No sore throat. NECK: No stiffness. No swollen glands. RESPIRATORY: No  cough or shortness of breath. No sputum production. CARDIAC: No chest pain or palpitations. GI: Positive nausea. No vomiting. No abdominal pain. No change in her bowels. GENITOURINARY: No change in her urination. She has not noted any dysuria. She has not noticed any decreased urination. MUSCULOSKELETAL: No joint pain. No muscle pain. She does have some pain at the skin lesion, on the posterior aspect of the right leg. SKIN: She has ulcers on her leg and her abdomen as well as prior old lesions that she has had scattered in various sites. NEUROLOGIC: No syncope. No focal weakness. PSYCHIATRIC: No complaints. All other systems are negative.   PHYSICAL EXAMINATION:   VITAL SIGNS: Temperature in the emergency room was 93.1, pulse is currently 92, blood pressure is 85/50 on pressors, respirations 19, and 98% on room air.   GENERAL: A 59 year old white female in no acute distress, but somewhat confused.   HEENT: Normocephalic, atraumatic. Pupils equal, round, and reactive to light. Extraocular motion intact. Sclerae, conjunctivae, and lids are without evidence for emboli or petechiae. Oropharynx shows no erythema or exudate. Teeth and gums are in fair condition.   NECK: Supple. Full range of motion. Midline trachea. No lymphadenopathy. No thyromegaly.   LUNGS: Clear to auscultation bilaterally. Good air movement. No focal consolidation.   HEART: Regular rate and rhythm without murmur, rub, or gallop.   ABDOMEN: Soft, nontender, and nondistended. No hepatosplenomegaly. No hernia is noted.   EXTREMITIES: She had a wound on the right lower extremity posteriorly. This was covered in bandages and was not directly observed. There was no  significant edema. There was no erythema or lymphangitic streaking around this area.   SKIN: She has multiple scars from what appears to be previous furuncles. There was an area on the midabdomen that had an eschar present. Her leg was not directly observed.   NECK: There  were no stigmata of endocarditis, specifically no Janeway lesions or Osler nodes.   NEUROLOGIC: The patient was awake and interactive, but was fully oriented x1 only. She thought she was at [facility]. She knew the month and the year, but not the day. At times she appeared to be somewhat confused. She was moving all four extremities, however.   PSYCHIATRIC: Her affect appeared somewhat flat.   LABS/STUDIES: BUN 115, creatinine 8.51, potassium 7.8, bicarbonate 7, and anion gap was not reported. LFTs were unremarkable. White count 15.5, hemoglobin 11.5, and platelet count 413.   A wound culture from 10/23/2011 is growing MRSA and enterococcus.   ABG:  9.62/31/113 on room air.   Chest x-ray from admission shows no acute disease of the chest.   IMPRESSION: This is a 59 year old white female with a history of MRSA furunculosis and diabetes admitted with possible sepsis and acute renal failure.   RECOMMENDATIONS:  1. She is mildly confused and was not a very good historian. Her blood pressure remains low despite pressors and she is somewhat hypothermic. Sepsis is a possibility. She has had multiple recurrent MRSA lesions, but has never had invasive disease in the past. I would continue hydration.  2. I agree with covering with vancomycin and Zosyn. We will continue both until the blood cultures return.  3. She is to start CVVHD tonight. It is possible that if she received a sulfa drug recently that she could have developed acute renal failure related to this.  4. We will await the blood and urine cultures.  5. Would continue contact isolation.  6. Would continue pressor support as needed.  7. We will follow her temperature curve and CBC.   Thank you very much for involving me in Ms. Heck's care.   TIME SPENT: Approximately 40 minutes were spent providing critical care to this patient.  ____________________________ Rosalyn Gess. Shalay Carder, MD meb:slb D: 10/28/2011 18:21:58 ET     T: 10/29/2011  09:53:28 ET       JOB#: [id number] cc: Rosalyn Gess. Pranavi Aure, MD, <Dictator> Bolton Canupp E Daimian Sudberry MD ELECTRONICALLY SIGNED 10/30/2011 17:27

## 2014-11-06 NOTE — Consult Note (Signed)
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  She is mildly confused and was not a very good historian.  Her BP remains low despite pressors and she is somewhat hypothermic.  Sepsis is a possibility.  She has had multiple, recurrent Methacillin Resistant Staph aureus lesions, but has never had invasive disease in the past.  Continue hydration. Agree with covering with vanco and zosyn.  Will continue both until BCx return. To start CVVHD tonight. Await blood and urine cultures. Contact isolation. Continue pressor support as needed. Follow temp curve and CBC.  Electronic Signatures: Aqsa Sensabaugh, Rosalyn GessMichael E (MD)  (Signed on 15-Apr-13 18:14)  Authored  Last Updated: 15-Apr-13 18:14 by Jalaya Sarver, Rosalyn GessMichael E (MD)

## 2014-11-06 NOTE — Op Note (Signed)
PATIENT NAME:  [name] PomfretMCDONALD, Zaynab H MR#:  [id number] DATE OF BIRTH:  Jan 30, 1956  DATE OF PROCEDURE:  10/29/2011  PREOPERATIVE DIAGNOSES:  1. Acute renal failure.  2. Severe acidosis.  3. Hyperkalemia improved with initial dialysis treatments.  4. Nonfunctional right femoral dialysis catheter.  5. Morbid obesity.   POSTOPERATIVE DIAGNOSES:    1. Acute renal failure.  2. Severe acidosis.  3. Hyperkalemia improved with initial dialysis treatments.  4. Nonfunctional right femoral dialysis catheter.  5. Morbid obesity.   PROCEDURES:    1. Ultrasound guidance for vascular access, right jugular vein.  2. Placement of right jugular 20 cm DuoGlide dialysis catheter.   SURGEON: Annice NeedyJason S. Ineta Sinning, M.D.   ANESTHESIA: Local. BLOOD LOSS: Minimal.   INDICATION FOR PROCEDURE: A 59 year old white female who was admitted with acute renal failure, severe acidosis and hyperkalemia. She received some CRRT through a right femoral dialysis catheter last night; however, this has become nonfunctional and is no longer able to give her CRRT. A new line will be placed. Due to her morbid obesity, it is thought that maybe a jugular location will work better than the femoral and so this is our plan.   DESCRIPTION OF PROCEDURE: The patient's right neck was sterilely prepped and draped, a sterile surgical field was created and the right jugular vein was visualized with ultrasound and found to be widely patent. It was accessed without difficulty with a Seldinger needle and a J-wire was placed. After skin nick and dilatation, the 20 cm long DuoGlide dialysis catheter was placed over the wire and the wire was removed. Both lumens withdrew dark red nonpulsatile blood and flushed easily with sterile saline. It was secured at 20 cm with three nylon sutures. A stat chest x-ray is pending.  ____________________________ Annice NeedyJason S. Kirston Luty, MD jsd:ap D: 10/29/2011 12:58:37 ET T: 10/29/2011 13:41:24 ET JOB#: [id number]  cc: Annice NeedyJason S. Moncerrat Burnstein,  MD, <Dictator>  Annice NeedyJASON S Sharmaine Bain MD ELECTRONICALLY SIGNED 11/04/2011 9:55

## 2014-11-06 NOTE — Consult Note (Signed)
Patient with ARF, severe hyperkalemia, hypotension.  Temp HD catheter placed at bedside  Electronic Signatures: Annice Needyew, Zula Hovsepian S (MD)  (Signed on 15-Apr-13 14:50)  Authored  Last Updated: 15-Apr-13 14:50 by Annice Needyew, Arlisha Patalano S (MD)

## 2014-11-06 NOTE — Consult Note (Signed)
PATIENT NAME:  [name], [name] MR#:  [id number] DATE OF BIRTH:  11-03-1955  DATE OF CONSULTATION:  10/28/2011  REFERRING PHYSICIAN:   Daryel NovemberJonathan Williams, MD, Emergency Room CONSULTING PHYSICIAN:  Annice NeedyJason S. Dew, MD  REASON FOR CONSULTATION: Dialysis catheter placement.  HISTORY OF PRESENT ILLNESS: This is a 59 year old white female who presents critically ill, hypotensive, with altered mental status, severe hyperkalemia with a potassium of 7.8. She also has hypotension requiring pressors and a severe acidosis with a pH of less than 7. Her BUN is greater than 100. She is in acute renal failure and urgent renal replacement therapy is going to be attempted for her multiple issues. We are asked to place a catheter for this reason.  PAST MEDICAL HISTORY:  1. Diabetes.  2. Hypertension.  3. Chronic foot wounds.  4. Obesity.   PAST SURGICAL HISTORY: Cyst drainage in multiple areas with abscesses.   ALLERGIES: Vicodin.   HOME MEDICATIONS:  1. Percocet as needed for pain.  2. Metformin 1000 mg b.i.d.  3. Lisinopril 10 mg daily.  4. Glipizide 10 mg daily.  5. Pseudoephedrine 10 mg as needed.  6. Lantus 10 units subcutaneous at bedtime.   SOCIAL HISTORY: No alcohol or tobacco.  Lives with her son.  FAMILY HISTORY: Father apparently died of cancer. Mother died of chronic obstructive pulmonary disease.   REVIEW OF SYSTEMS: Pretty difficult to obtain due to severity of illness and diminished mental status.   PHYSICAL EXAMINATION:  GENERAL: This is an obese white female who is critically ill in the Emergency Room.   VITAL SIGNS: Temperature 93.1, pulse 90, blood pressure 90/52.    HEAD: Normocephalic and atraumatic.   EYES: Sclerae anicteric. Conjunctivae are clear.   EARS: Normal external appearance. Hearing appears to be intact.   NECK: Neck is supple. No adenopathy or jugular venous distention.   HEART: Regular rate and rhythm without murmur.   LUNGS: Diminished, but equal  bilaterally with increased respirations present.   ABDOMEN: Soft, nondistended, no tenderness. She has areas on the lower abdomen on both sides that look like necrotic eschars from insulin sticks I assume without current fluctuance or erythema.   EXTREMITIES: Extremities show mild lower extremity edema. Her capillary refill is present although her pedal pulses are not palpable.   NEURO: She is arousable, not oriented. Moves all four extremities spontaneously without focal deficits apparent.  SKIN: Skin changes on the lower abdomen present bilaterally and a right foot wound.   LABORATORY DATA: Sodium 138, potassium 7.8, chloride 111, CO2 7, BUN 115, creatinine 8.5, glucose 131, white blood cell count 15.5, hemoglobin 11.5, platelet count 413,000. pH 6.92, pCO2 31, pO2 113. Lactic acid was 3.9.   ASSESSMENT AND PLAN: This is a 59 year old white female with acute renal failure, hyperkalemia, hypotension, and acidosis. Immediate continuous renal replacement therapy is planned and a dialysis catheter will be placed at the bedside.   This is a level-3 consultation.    ____________________________ Annice NeedyJason S. Dew, MD jsd:bjt D: 10/28/2011 15:54:44 ET T: 10/28/2011 17:18:18 ET JOB#: [id number]  cc: Annice NeedyJason S. Dew, MD, <Dictator> Annice NeedyJASON S DEW MD ELECTRONICALLY SIGNED 11/04/2011 9:55

## 2014-11-06 NOTE — H&P (Signed)
PATIENT NAME:  [name] PomfretMCDONALD, Lauralyn H MR#:  [id number] DATE OF BIRTH:  1956-04-29  DATE OF ADMISSION:  10/28/2011  PRIMARY CARE PHYSICIAN: [facility]  CHIEF COMPLAINT: Not feeling well.   HISTORY OF PRESENT ILLNESS: This is a 59 year old female with history of diabetes, hypertension, chronic wound on the foot, and cysts. She presents to the ER with not feeling well, felt like flulike symptoms, very weak, unable to get out of bed, felt lightheaded, and nauseous. With these symptoms, her son took her blood pressure this morning, very low in the 70s, and they called EMS. She is unable to walk very much over the past few weeks secondary to pain and chronic lower extremity wound. Pain ranges between 3 and 10 out of 10 in intensity. The patient has been on antibiotics in the past. The son thinks it was Cipro and Bactrim, but the patient has been off antibiotics for about a month. They recently did a culture of this wound, which showed a light growth of MRSA and rare enterococcus; that was done on 10/23/2011. The patient did have an Unna boot placed, which was taken off in the emergency room. She did develop some bedsores since she has been sitting in the chair and unable to sleep in the bed at night. In the emergency room, she was found to be very acidotic with a pH of 6.92 and found to be in acute renal failure with a creatinine of 8.51 and a BUN of 115, also potassium very elevated at 7.8, and white count elevated at 15.5. Hospitalist services were contacted for further evaluation. Blood pressure was also found in the 70s.   PAST MEDICAL HISTORY:  1. Diabetes. 2. Hypertension. 3. Chronic wound on the foot. 4. Cysts that developed decubitus.   PAST SURGICAL HISTORY: Cyst drained, numerous over the past in different areas.   ALLERGIES: Vicodin.      MEDICATIONS:  1. Percocet 1 to 2 tablets every six hours as needed for pain. 2. Metformin 1000 mg twice a day.  3. Lisinopril 10  mg daily.  4. Glipizide ER 10 mg daily.  5. Pseudoephedrine PE 10 mg p.r.n.  6. Lantus 10 units subcutaneous injection at night.   SOCIAL HISTORY: No smoking. No alcohol. No drug use. Lives with her son. 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   FAMILY HISTORY: Father died of an unknown type of cancer. Mother died of chronic obstructive pulmonary disease.  REVIEW OF SYSTEMS: CONSTITUTIONAL: No fever, no chills, and no sweats. Positive for weakness. No weight loss. No weight gain. EYES: Does wear glasses. EARS, NOSE, MOUTH, AND THROAT: No hearing loss. No sore throat. No difficulty swallowing. CARDIOVASCULAR: No chest pain. No palpitations. RESPIRATORY: No shortness of breath. No coughing. No sputum. No hemoptysis. GASTROINTESTINAL: Positive for nausea. Positive for vomiting. No abdominal pain, no diarrhea, no constipation, and no bright red blood per rectum. No melena. GENITOURINARY: No burning on urination or hematuria. MUSCULOSKELETAL: Positive for joint pain and wound pain. INTEGUMENTARY: Positive for ulcer on the leg and decubitus on the buttock. NEUROLOGIC: Felt lightheaded especially with standing. PSYCHIATRIC: No anxiety or depression. ENDOCRINE: No thyroid problems. HEMATOLOGIC/LYMPHATIC: No anemia. No easy bruising or bleeding.   PHYSICAL EXAMINATION:   VITAL SIGNS: No temperature documented since presentation, pulse 86, respirations 16, blood pressure 89/54, and pulse oximetry 98% on oxygen. Vital signs on first presentation to the ER included a pulse of 88, respirations 16, blood pressure 77/48, and pulse oximetry  98% room air.   GENERAL: No respiratory distress.   EYES: Conjunctivae and lids normal. Pupils equal, round, and reactive to light. Extraocular muscles intact. No nystagmus.   EARS, NOSE, MOUTH, AND THROAT: Tympanic membranes no erythema. Nasal mucosa no erythema. Throat no erythema. No exudate seen. Scab on the right lower lip. Gums normal.   NECK: No JVD. No  bruits. No lymphadenopathy. No thyromegaly. No thyroid nodules palpated.   LUNGS: Clear to auscultation. No use of accessory muscles to breathe. No rhonchi, rales, or wheeze heard.   HEART: S1 and S2 normal. No gallops, rubs, or murmurs heard. Carotid upstroke 2+ bilaterally. No bruits.   EXTREMITIES: Dorsalis pedis pulses half plus in bilateral lower extremities. Trace edema of the lower extremities.   ABDOMEN: Soft, nontender. No organomegaly/splenomegaly. Normoactive bowel sounds. No masses felt.   LYMPHATIC: No lymph nodes in the neck.   MUSCULOSKELETAL: No clubbing. Trace edema. No cyanosis on oxygen.   SKIN: On the buttock there is stage II small decubitus. One with secondary excoriations around the rectal area and some smaller subcentimeter areas that have some scabbing on them. On the right lower leg, there is a large wound that is about 8 x 8 cm, estimated. Looks like good granulation tissue, but there is greenish drainage from there.   NEUROLOGIC: Cranial nerves II through XII grossly intact. Deep tendon reflexes half plus bilateral lower extremities.   PSYCHIATRIC: The patient is oriented to person, place, and time.   LABS/RADIOLOGICAL DATA: Wound culture from 10/23/2011 showed light growth of MRSA, rare enterococcus.   EKG looks like a bundle branch block, 94 beats per minute, low voltage, QTC 502.   ASSESSMENT AND PLAN:  1. Shock, either hypovolemic versus sepsis. Need to rule out sepsis with blood cultures x2, urine culture. The patient did have a recent wound culture that grew out methicillin-resistant Staphylococcus aureus and enterococcus. Since the patient is very sick, I will cover with Zyvox at this point and give one dose of Zosyn and get an ID consult. We will give normal saline bolus 999 x2 liters and then normal saline hydration, Levothroid if needed. We will admit to the Critical Care Unit.  2. Acute renal failure, severe acidosis, severe hyperkalemia, and abnormal  EKG. The ER physician discussed the case with Dr. Cherylann Ratel from nephrology to set up CRRT, acute dialysis in the Critical Care Unit. Dr. Wyn Quaker, vascular surgery, was notified for catheter placement. Initial treatment for the hyperkalemia underway by The ER physician who ordered calcium gluconate, D50, Kayexalate, and sodium bicarbonate. We will get a renal sonogram tomorrow, in the a.m., and Foley catheter placement with strict ins and outs.  3. Severe hypoglycemia, most likely secondary to acute renal failure. Glipizide, metformin, and Lantus administration. We will hold all diabetic medications. Fingerstick every 1 hour for right now until more stabilized. We will put D5W in the IV fluids.  4. MRSA wound on the left leg with enterococcus. We will cover with Zosyn for right now. ID consult.  5. Stage II decubitus on the buttock. Positioning changes recommended.   TIME SPENT ON ADMISSION: 60 minutes of critical care time.  ____________________________ Herschell Dimes. Renae Gloss, MD rjw:slb D: 10/28/2011 13:25:14 ET T: 10/28/2011 14:20:39 ET JOB#: [id number]  cc: Herschell Dimes. Renae Gloss, MD, <Dictator> Dr. Hilda Blades - [facility] Salley Scarlet MD ELECTRONICALLY SIGNED 11/04/2011 21:25

## 2014-11-06 NOTE — Op Note (Signed)
PATIENT NAME:  [name], [name] MR#:  [id number] DATE OF BIRTH:  [birth year]  DATE OF PROCEDURE:  10/28/2011  PREOPERATIVE DIAGNOSES:  1. Acute renal failure.  2. Severe hyperkalemia.  3. Severe acidosis with #2 and #3 necessitating urgent dialysis.   POSTOPERATIVE DIAGNOSES:  1. Acute renal failure.  2. Severe hyperkalemia.  3. Severe acidosis with #2 and #3 necessitating urgent dialysis.   PROCEDURES:  1. Ultrasound guidance for vascular access, right femoral vein.  2. Placement of a right femoral vein temporary dialysis catheter.   SURGEON: Annice NeedyJason S. Josilynn Losh, M.D.   ANESTHESIA: Local.   ESTIMATED BLOOD LOSS: 50 mL.   INDICATION FOR PROCEDURE: This is a 59 year old white female who is being admitted with acute renal failure, severe acidosis, severe hyperkalemia, and hypotensive shock. Dialysis is urgently indicated.   DESCRIPTION OF PROCEDURE: The patient's right groin was sterilely prepped and draped and a sterile surgical field was created. Due to body habitus, ultrasound was used. The right femoral vein was accessed initially and initial wire was placed, but on attempts at dilatation the wire kinked and we had to stop and re-access. We re-accessed at a slightly different angle with a longer stiffer wire placed through the needle and the needle was removed. After skin nick dilatation, this time the 30 cm DuoGlide dialysis catheter was placed over the wire and the wire     was removed. It was secured to the skin with three nylon sutures. Both lumens withdrew blood well and flushed easily with sterile saline. A sterile dressing was placed. The patient tolerated the procedure well. ____________________________ Annice NeedyJason S. Maliek Schellhorn, MD jsd:slb D: 10/28/2011 15:25:22 ET T: 10/28/2011 17:00:04 ET JOB#: [id number]  cc: Annice NeedyJason S. Daelyn Mozer, MD, <Dictator> Annice NeedyJASON S Dairon Procter MD ELECTRONICALLY SIGNED 11/04/2011 9:55

## 2014-11-06 NOTE — Consult Note (Signed)
PATIENT NAME:  [name], [name] MR#:  [id number] DATE OF BIRTH:  1955-12-01  DATE OF CONSULTATION:  10/28/2011  REFERRING PHYSICIAN:  Lenise Arena, MD CONSULTING PHYSICIAN:  Marisel Tostenson Lilian Kapur, MD  REASON FOR CONSULTATION: Severe acute renal failure, hyperkalemia, and metabolic acidosis.   HISTORY OF PRESENT ILLNESS: The patient is a very pleasant 59 year old Caucasian female with past medical history of diabetes mellitus, hypertension, chronic left lower extremity wound, and history of decubitus ulcer who presented to [facility] today with generalized malaise and fatigue. Currently she is able to offer history. Per her report, over the past 2 to 3 days she has had very little low p.o. intake. This is confirmed by her husband. She developed significant fatigue and weakness thereafter. She felt so bad today that EMS was called. EMS found the patient to be significantly hypotensive with systolic blood pressure in the 70s. Upon arrival here, the patient was found to have severe metabolic derangements. Her serum glucose was very low at 29 when she first arrived. She was found to have a BUN of 115 and creatinine of 8.51. She also had significant hyperkalemia with a potassium of 7.8. She also had quite significant acidosis with a pH of 6.92, pCO2 of 31, and pO2 of 113. In addition to this, the patient reports to me that she has been taking ibuprofen, approximately 8 tablets per day. She is also noted to be on metformin 500 mg tablets two tablets p.o. twice a day. She was also taking lisinopril 10 mg p.o. daily. Poor p.o. intake and use of ibuprofen, lisinopril, and metformin could certainly account for her current renal parameters.   PAST MEDICAL HISTORY:  1. Hypertension.  2. Diabetes mellitus.  3. Morbid obesity.  4. Chronic right lower extremity wound.  5. History of decubitus ulcer.   ALLERGIES: Vicodin.   HOME MEDICATIONS:  1. Metformin 1000 mg p.o. twice a  day. 2. Lisinopril 10 mg daily.   3. Ibuprofen 200 mg tablets one tablet 3 to 4 times a day.  4. Glipizide extended release 10 mg p.o. daily.  5. Pseudoephedrine 10 mg as needed.  6. Lantus 10 units subcutaneous at bedtime.   SOCIAL HISTORY: The patient lives in [place]. She is married. She has one son. 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 She denies tobacco, alcohol, or illicit drug use. The patient's mother died secondary to complications of chronic obstructive pulmonary disease. Father died of an unknown type of cancer.   REVIEW OF SYSTEMS: CONSTITUTIONAL: Reports fatigue and weakness. Denies fevers or chills. EYES: Denies diplopia, blurry vision, or loss of vision. HEENT: Denies headaches or hearing loss. Denies epistaxis or sore throat. CARDIOVASCULAR: Denies chest pain, palpitations, PND, or orthopnea. PULMONARY: Denies cough, shortness of breath, or hemoptysis. GI: Has had some nausea and one episode of vomiting. Denies diarrhea or bloody stools. GU: Denies frequency, urgency, or dysuria. Does report diminished urine output. MUSCULOSKELETAL: Reports right lower extremity pain that has been chronic in nature. INTEGUMENTARY: Denies skin rashes or lesions. NEUROLOGIC: Denies focal extremity numbness or weakness, but does have generalized weakness. PSYCHIATRIC: Denies depression or bipolar disorder. ENDOCRINE: Denies polyuria, polydipsia, or polyphagia. Does have history of diabetes mellitus. HEMATOLOGIC/LYMPHATIC: Denies easy bruisability, bleeding, or swollen lymph nodes. ALLERGY/IMMUNOLOGIC: Denies seasonal allergies or history of immunodeficiency.   PHYSICAL EXAMINATION:   VITAL SIGNS: Temperature 98.3, pulse 90, respirations 18, and blood pressure 83/50.   GENERAL: Ill appearing female seen in the emergency  department.  HEENT: Normocephalic, atraumatic. Extraocular movements are intact. Pupils equal, round, and reactive to light. No scleral icterus.  Conjunctivae are pink. No epistaxis noted. Gross hearing intact. Oral mucosa is extremely dry.   NECK: Supple. No JVD or lymphadenopathy.   LUNGS: Clear to auscultation bilaterally with normal respiratory effort.   HEART: S1 and S2 regular rate and rhythm. No audible rub.  ABDOMEN: Soft, nontender, and nondistended. Bowel sounds positive. No rebound or guarding. No gross organomegaly appreciated.   EXTREMITIES: The right lower extremity is bandaged. There is trace left lower extremity edema noted.   NEUROLOGIC: The patient is awake, alert, and oriented to time, person, and place. Strength is 5/5 in both upper and lower extremities.   SKIN: Warm and dry. Decreased skin turgor noted.   MUSCULOSKELETAL: Right lower extremity is bandaged. The bandage was not taken down as it was taken down by multiple physicians recently.   PSYCHIATRIC: The patient has an appropriate affect and has some insight into her current illness.   GU: No suprapubic tenderness is noted at this time.   LABS/STUDIES: Sodium 138, potassium 7.8, chloride 111, CO2 7, BUN 115, creatinine 8.51, and glucose 131. CBC shows WBC 15.5, hemoglobin 11.5, hematocrit 37, and platelets 413.   ABG shows pH 6.92 pCO2 31, and pO2 113.   Chest x-ray shows no acute disease of the chest.   IMPRESSION: This is a 59 year old Caucasian female with past medical history of hypertension, diabetes mellitus, chronic right lower extremity wound, history of decubitus ulcer, and morbid obesity who presented to [facility] with generalized weakness and fatigue and found to have severe acute renal failure, hyperkalemia, metabolic acidosis, and hypotension in the setting of dehydration and extensive ibuprofen usage. 1. Acute renal failure: This appears to be quite severe at this point in time. We do not know her baseline BUN and creatinine; however, the patient denies any history of chronic kidney disease. She is clearly in need of  dialysis at this point in time and temporary dialysis catheter placement has been performed. We plan to perform continuous renal replacement therapy in the Critical Care Unit, at the bedside. I have discussed this with nursing in the Critical Care Unit. We are currently awaiting transfer up to the Critical Care Unit as bed supply has been quit limited today. We will start with a blood flow rate of 300 and dialysis flow rate of 2000 mL/hour. We will not perform any ultrafiltration at this point in time. We will proceed with renal ultrasound, SPEP, UPEP, ANA, ANCA antibodies, GBM antibodies, urine for eosinophils, C3, C4, urine protein to creatinine ratio, and urinalysis.  2. Hyperkalemia: Noted to be quite severe and due in part to the underlying metabolic acidosis. We will proceed with continuous renal placement therapy as above and follow serial BMPs.  3. Metabolic acidosis: It is likely multifactorial with contributions from metformin as well as from renal failure itself. This again should be treated with continuous renal replacement therapy and we will continue to follow serum bicarbonate as well as ABGs.  4. Hypotension: Either sepsis versus hypovolemic shock. Would continue IV fluid hydration for now, however, we will also be performing continuous renal replacement therapy without ultrafiltration at this point in time.  5. The patient is critically ill at this point in time and has some chance for decompensation. Further plan as the patient progresses. The case was discussed in depth with the ED physician as well as with Dr. Loletha Grayer.  ____________________________  Tama High, MD mnl:slb D: 10/28/2011 16:43:40 ET T: 10/28/2011 17:12:57 ET JOB#: [id number]  cc: Tama High, MD, <Dictator> Mariah Milling Raoul Ciano MD ELECTRONICALLY SIGNED 11/18/2011 21:10

## 2014-11-16 ENCOUNTER — Other Ambulatory Visit: Payer: Self-pay

## 2014-11-16 ENCOUNTER — Encounter: Payer: Self-pay | Admitting: *Deleted

## 2014-11-16 DIAGNOSIS — N186 End stage renal disease: Secondary | ICD-10-CM | POA: Diagnosis not present

## 2014-11-16 DIAGNOSIS — I120 Hypertensive chronic kidney disease with stage 5 chronic kidney disease or end stage renal disease: Secondary | ICD-10-CM | POA: Diagnosis not present

## 2014-11-16 DIAGNOSIS — E1140 Type 2 diabetes mellitus with diabetic neuropathy, unspecified: Secondary | ICD-10-CM | POA: Diagnosis not present

## 2014-11-16 DIAGNOSIS — Z79899 Other long term (current) drug therapy: Secondary | ICD-10-CM | POA: Diagnosis not present

## 2014-11-16 DIAGNOSIS — Z801 Family history of malignant neoplasm of trachea, bronchus and lung: Secondary | ICD-10-CM | POA: Diagnosis not present

## 2014-11-21 ENCOUNTER — Encounter
Admission: RE | Admit: 2014-11-21 | Discharge: 2014-11-21 | Disposition: A | Discharge status: Home / Self Care | Payer: BC Managed Care – PPO | Source: Ambulatory Visit | Attending: Vascular Surgery | Admitting: Vascular Surgery

## 2014-11-21 DIAGNOSIS — I120 Hypertensive chronic kidney disease with stage 5 chronic kidney disease or end stage renal disease: Secondary | ICD-10-CM | POA: Diagnosis not present

## 2014-11-21 LAB — PROTIME-INR
INR: 1.08
PROTHROMBIN TIME: 14.2 s (ref 11.4–15.0)

## 2014-11-21 LAB — CBC
HCT: 32.6 % — ABNORMAL LOW (ref 35.0–47.0)
Hemoglobin: 10.5 g/dL — ABNORMAL LOW (ref 12.0–16.0)
MCH: 27.7 pg (ref 26.0–34.0)
MCHC: 32 g/dL (ref 32.0–36.0)
MCV: 86.6 fL (ref 80.0–100.0)
PLATELETS: 315 10*3/uL (ref 150–440)
RBC: 3.77 MIL/uL — ABNORMAL LOW (ref 3.80–5.20)
RDW: 14.3 % (ref 11.5–14.5)
WBC: 10.2 10*3/uL (ref 3.6–11.0)

## 2014-11-21 LAB — BASIC METABOLIC PANEL
Anion gap: 8 (ref 5–15)
BUN: 45 mg/dL — ABNORMAL HIGH (ref 6–20)
CALCIUM: 9.1 mg/dL (ref 8.9–10.3)
CO2: 25 mmol/L (ref 22–32)
Chloride: 107 mmol/L (ref 101–111)
Creatinine, Ser: 4.19 mg/dL — ABNORMAL HIGH (ref 0.44–1.00)
GFR calc Af Amer: 12 mL/min — ABNORMAL LOW (ref >60)
GFR calc non Af Amer: 11 mL/min — ABNORMAL LOW (ref >60)
GLUCOSE: 136 mg/dL — AB (ref 65–99)
Potassium: 4.9 mmol/L (ref 3.5–5.1)
Sodium: 140 mmol/L (ref 135–145)

## 2014-11-21 LAB — APTT: APTT: 30 s (ref 24–36)

## 2014-11-22 NOTE — OR Nursing (Signed)
ekg to anesthesia for review 

## 2014-11-23 ENCOUNTER — Ambulatory Visit
Admission: RE | Admit: 2014-11-23 | Discharge: 2014-11-23 | Disposition: A | Discharge status: Home / Self Care | Payer: BC Managed Care – PPO | Source: Ambulatory Visit | Attending: Vascular Surgery | Admitting: Vascular Surgery

## 2014-11-23 ENCOUNTER — Ambulatory Visit: Payer: BC Managed Care – PPO | Admitting: Anesthesiology

## 2014-11-23 ENCOUNTER — Encounter: Payer: Self-pay | Admitting: *Deleted

## 2014-11-23 ENCOUNTER — Encounter
Admission: RE | Disposition: A | Discharge status: Home / Self Care | Payer: Self-pay | Source: Ambulatory Visit | Attending: Vascular Surgery

## 2014-11-23 DIAGNOSIS — E1140 Type 2 diabetes mellitus with diabetic neuropathy, unspecified: Secondary | ICD-10-CM | POA: Insufficient documentation

## 2014-11-23 DIAGNOSIS — Z79899 Other long term (current) drug therapy: Secondary | ICD-10-CM | POA: Insufficient documentation

## 2014-11-23 DIAGNOSIS — Z801 Family history of malignant neoplasm of trachea, bronchus and lung: Secondary | ICD-10-CM | POA: Insufficient documentation

## 2014-11-23 DIAGNOSIS — I120 Hypertensive chronic kidney disease with stage 5 chronic kidney disease or end stage renal disease: Secondary | ICD-10-CM | POA: Diagnosis not present

## 2014-11-23 DIAGNOSIS — N186 End stage renal disease: Secondary | ICD-10-CM | POA: Insufficient documentation

## 2014-11-23 HISTORY — DX: Type 2 diabetes mellitus without complications: E11.9

## 2014-11-23 HISTORY — PX: AV FISTULA PLACEMENT: SHX1204

## 2014-11-23 HISTORY — DX: Chronic kidney disease, unspecified: N18.9

## 2014-11-23 HISTORY — DX: Essential (primary) hypertension: I10

## 2014-11-23 LAB — GLUCOSE, CAPILLARY
GLUCOSE-CAPILLARY: 139 mg/dL — AB (ref 70–99)
Glucose-Capillary: 146 mg/dL — ABNORMAL HIGH (ref 70–99)

## 2014-11-23 SURGERY — ARTERIOVENOUS (AV) FISTULA CREATION
Anesthesia: General | Laterality: Left

## 2014-11-23 MED ORDER — BUPIVACAINE-EPINEPHRINE (PF) 0.5% -1:200000 IJ SOLN
INTRAMUSCULAR | Status: AC
  Filled 2014-11-23: qty 30

## 2014-11-23 MED ORDER — EVICEL 2 ML EX KIT
PACK | CUTANEOUS | Status: DC | PRN
  Administered 2014-11-23: 2 via TOPICAL

## 2014-11-23 MED ORDER — CEFAZOLIN SODIUM 1-5 GM-% IV SOLN
1.0000 g | Freq: Once | INTRAVENOUS | Status: AC
  Administered 2014-11-23: 1 g via INTRAVENOUS

## 2014-11-23 MED ORDER — FENTANYL CITRATE (PF) 100 MCG/2ML IJ SOLN: 25.0000 ug | INTRAMUSCULAR | Status: DC | PRN

## 2014-11-23 MED ORDER — HYDROMORPHONE HCL 1 MG/ML IJ SOLN: 0.2500 mg | INTRAMUSCULAR | Status: DC | PRN

## 2014-11-23 MED ORDER — FENTANYL CITRATE (PF) 100 MCG/2ML IJ SOLN
INTRAMUSCULAR | Status: DC | PRN
  Administered 2014-11-23 (×2): 25 ug via INTRAVENOUS
  Administered 2014-11-23 (×2): 50 ug via INTRAVENOUS

## 2014-11-23 MED ORDER — PHENYLEPHRINE HCL 10 MG/ML IJ SOLN
INTRAMUSCULAR | Status: DC | PRN
  Administered 2014-11-23: 200 ug via INTRAVENOUS
  Administered 2014-11-23 (×2): 100 ug via INTRAVENOUS

## 2014-11-23 MED ORDER — ONDANSETRON HCL 4 MG/2ML IJ SOLN
INTRAMUSCULAR | Status: DC | PRN
  Administered 2014-11-23: 4 mg via INTRAVENOUS

## 2014-11-23 MED ORDER — NEOSTIGMINE METHYLSULFATE 10 MG/10ML IV SOLN
INTRAVENOUS | Status: DC | PRN
  Administered 2014-11-23: 5 mg via INTRAVENOUS

## 2014-11-23 MED ORDER — PROPOFOL 10 MG/ML IV BOLUS
INTRAVENOUS | Status: DC | PRN
  Administered 2014-11-23: 50 mg via INTRAVENOUS
  Administered 2014-11-23: 200 mg via INTRAVENOUS

## 2014-11-23 MED ORDER — LIDOCAINE HCL (CARDIAC) 20 MG/ML IV SOLN
INTRAVENOUS | Status: DC | PRN
  Administered 2014-11-23: 60 mg via INTRAVENOUS

## 2014-11-23 MED ORDER — MIDAZOLAM HCL 2 MG/2ML IJ SOLN
INTRAMUSCULAR | Status: DC | PRN
Start: 2014-11-23 — End: 2014-11-23
  Administered 2014-11-23: 2 mg via INTRAVENOUS

## 2014-11-23 MED ORDER — OXYCODONE-ACETAMINOPHEN 5-325 MG PO TABS: 1.0000 | ORAL_TABLET | Freq: Once | ORAL | Status: DC

## 2014-11-23 MED ORDER — ONDANSETRON HCL 4 MG/2ML IJ SOLN
4.0000 mg | Freq: Once | INTRAMUSCULAR | Status: AC | PRN
  Administered 2014-11-23: 4 mg via INTRAVENOUS

## 2014-11-23 MED ORDER — ROCURONIUM BROMIDE 100 MG/10ML IV SOLN
INTRAVENOUS | Status: DC | PRN
  Administered 2014-11-23: 40 mg via INTRAVENOUS

## 2014-11-23 MED ORDER — SODIUM CHLORIDE 0.9 % IV SOLN
INTRAVENOUS | Status: DC
  Administered 2014-11-23: 12:00:00 via INTRAVENOUS

## 2014-11-23 MED ORDER — GLYCOPYRROLATE 0.2 MG/ML IJ SOLN
INTRAMUSCULAR | Status: DC | PRN
  Administered 2014-11-23: 1 mg via INTRAVENOUS

## 2014-11-23 MED ORDER — OXYCODONE-ACETAMINOPHEN 5-325 MG PO TABS: 1.0000 | ORAL_TABLET | ORAL | Status: DC | PRN

## 2014-11-23 MED ORDER — ONDANSETRON HCL 4 MG/2ML IJ SOLN
INTRAMUSCULAR | Status: AC
  Filled 2014-11-23: qty 2

## 2014-11-23 MED ORDER — CEFAZOLIN SODIUM 1-5 GM-% IV SOLN
INTRAVENOUS | Status: AC
Start: 2014-11-23 — End: 2014-11-23
  Administered 2014-11-23: 1 g via INTRAVENOUS
  Filled 2014-11-23: qty 50

## 2014-11-23 MED ORDER — OXYCODONE-ACETAMINOPHEN 5-325 MG PO TABS
ORAL_TABLET | ORAL | Status: AC
  Administered 2014-11-23: 1 via ORAL
  Filled 2014-11-23: qty 1

## 2014-11-23 MED ORDER — SODIUM CHLORIDE 0.9 % IV SOLN
10000.0000 ug | INTRAVENOUS | Status: DC | PRN
  Administered 2014-11-23: 100 ug/min via INTRAVENOUS

## 2014-11-23 MED ORDER — OXYCODONE-ACETAMINOPHEN 5-325 MG PO TABS
1.0000 | ORAL_TABLET | ORAL | Status: DC | PRN
Start: 2014-11-23 — End: 2014-11-23
  Administered 2014-11-23: 1 via ORAL

## 2014-11-23 MED ORDER — HEPARIN SODIUM (PORCINE) 5000 UNIT/ML IJ SOLN
INTRAMUSCULAR | Status: AC
  Administered 2014-11-23: 3000 [IU] via SUBCUTANEOUS
  Filled 2014-11-23: qty 1

## 2014-11-23 SURGICAL SUPPLY — 53 items
BAG DECANTER STRL (MISCELLANEOUS) ×3 IMPLANT
BLADE SURG SZ11 CARB STEEL (BLADE) ×3 IMPLANT
BNDG COHESIVE 4X5 TAN STRL (GAUZE/BANDAGES/DRESSINGS) ×3 IMPLANT
BOOT SUTURE AID YELLOW STND (SUTURE) ×3 IMPLANT
BRUSH SCRUB 4% CHG (MISCELLANEOUS) ×3 IMPLANT
CANISTER SUCT 1200ML W/VALVE (MISCELLANEOUS) ×3 IMPLANT
CHLORAPREP W/TINT 26ML (MISCELLANEOUS) ×3 IMPLANT
CLIP SPRNG 6MM S-JAW DBL (CLIP) ×3
ELECT CAUTERY BLADE 6.4 (BLADE) ×3 IMPLANT
EVICEL 2ML SEALANT HUMAN (Miscellaneous) ×3 IMPLANT
EVICEL AIRLESS SPRAY ACCES (MISCELLANEOUS) ×3 IMPLANT
GEL ULTRASOUND 20GR AQUASONIC (MISCELLANEOUS) ×3 IMPLANT
GLOVE BIO SURGEON STRL SZ7 (GLOVE) ×3 IMPLANT
GOWN STRL REUS W/ TWL LRG LVL3 (GOWN DISPOSABLE) ×1 IMPLANT
GOWN STRL REUS W/ TWL XL LVL3 (GOWN DISPOSABLE) ×1 IMPLANT
GOWN STRL REUS W/TWL LRG LVL3 (GOWN DISPOSABLE) ×2
GOWN STRL REUS W/TWL XL LVL3 (GOWN DISPOSABLE) ×2
HEMOSTAT SURGICEL 2X3 (HEMOSTASIS) ×3 IMPLANT
IV NS 500ML (IV SOLUTION) ×2
IV NS 500ML BAXH (IV SOLUTION) ×1 IMPLANT
KIT RM TURNOVER STRD PROC AR (KITS) ×3 IMPLANT
LABEL OR SOLS (LABEL) ×3 IMPLANT
LIQUID BAND (GAUZE/BANDAGES/DRESSINGS) ×3 IMPLANT
LOOP RED MAXI  1X406MM (MISCELLANEOUS) ×2
LOOP VESSEL MAXI 1X406 RED (MISCELLANEOUS) ×1 IMPLANT
LOOP VESSEL MINI 0.8X406 BLUE (MISCELLANEOUS) ×1 IMPLANT
LOOPS BLUE MINI 0.8X406MM (MISCELLANEOUS) ×2
NEEDLE FILTER BLUNT 18X 1/2SAF (NEEDLE) ×2
NEEDLE FILTER BLUNT 18X1 1/2 (NEEDLE) ×1 IMPLANT
NEEDLE HYPO 30X.5 LL (NEEDLE) ×3 IMPLANT
NS IRRIG 500ML POUR BTL (IV SOLUTION) ×3 IMPLANT
PACK EXTREMITY ARMC (MISCELLANEOUS) ×3 IMPLANT
PAD GROUND ADULT SPLIT (MISCELLANEOUS) ×3 IMPLANT
PAD PREP 24X41 OB/GYN DISP (PERSONAL CARE ITEMS) ×3 IMPLANT
SOLUTION CELL SAVER (CLIP) ×1 IMPLANT
STOCKINETTE IMPERVIOUS 9X36 MD (GAUZE/BANDAGES/DRESSINGS) ×3 IMPLANT
STOCKINETTE STRL 4IN 9604848 (GAUZE/BANDAGES/DRESSINGS) ×3 IMPLANT
SUT GTX CV-6 30 (SUTURE) ×6 IMPLANT
SUT MNCRL AB 4-0 PS2 18 (SUTURE) ×3 IMPLANT
SUT PROLENE 6 0 BV (SUTURE) IMPLANT
SUT SILK 2 0 (SUTURE) ×2
SUT SILK 2 0 SH (SUTURE) ×3 IMPLANT
SUT SILK 2-0 18XBRD TIE 12 (SUTURE) ×1 IMPLANT
SUT SILK 3 0 (SUTURE) ×2
SUT SILK 3-0 18XBRD TIE 12 (SUTURE) ×1 IMPLANT
SUT SILK 4 0 (SUTURE) ×2
SUT SILK 4-0 18XBRD TIE 12 (SUTURE) ×1 IMPLANT
SUT VIC AB 3-0 SH 27 (SUTURE) ×4
SUT VIC AB 3-0 SH 27X BRD (SUTURE) ×2 IMPLANT
SYR 20CC LL (SYRINGE) IMPLANT
SYR 3ML LL SCALE MARK (SYRINGE) IMPLANT
SYR TB 1ML 27GX1/2 LL (SYRINGE) ×3 IMPLANT
TOWEL OR 17X26 4PK STRL BLUE (TOWEL DISPOSABLE) IMPLANT

## 2014-11-23 NOTE — Anesthesia Preprocedure Evaluation (Signed)
Anesthesia Evaluation  Patient identified by MRN, date of birth, ID band Patient awake    Reviewed: Allergy & Precautions, NPO status , Patient's Chart, lab work & pertinent test results, reviewed documented beta blocker date and time   History of Anesthesia Complications Negative for: history of anesthetic complications  Airway Mallampati: III  TM Distance: >3 FB Neck ROM: Full    Dental no notable dental hx. (+) Chipped,    Pulmonary neg pulmonary ROS,  breath sounds clear to auscultation  Pulmonary exam normal       Cardiovascular Exercise Tolerance: Good hypertension, Pt. on medications and Pt. on home beta blockers Normal cardiovascular examRhythm:Regular Rate:Normal     Neuro/Psych negative neurological ROS  negative psych ROS   GI/Hepatic negative GI ROS, Neg liver ROS,   Endo/Other  negative endocrine ROSdiabetes, Well Controlled, Type 2, Insulin Dependent  Renal/GU ESRFRenal disease  negative genitourinary   Musculoskeletal negative musculoskeletal ROS (+)   Abdominal   Peds negative pediatric ROS (+)  Hematology negative hematology ROS (+)   Anesthesia Other Findings   Reproductive/Obstetrics negative OB ROS                             Anesthesia Physical Anesthesia Plan  ASA: III  Anesthesia Plan: General   Post-op Pain Management:    Induction: Intravenous  Airway Management Planned: LMA  Additional Equipment:   Intra-op Plan:   Post-operative Plan: Extubation in OR  Informed Consent: I have reviewed the patients History and Physical, chart, labs and discussed the procedure including the risks, benefits and alternatives for the proposed anesthesia with the patient or authorized representative who has indicated his/her understanding and acceptance.   Dental advisory given  Plan Discussed with: CRNA and Surgeon  Anesthesia Plan Comments:          Anesthesia Quick Evaluation

## 2014-11-23 NOTE — Op Note (Signed)
[facility]   OPERATIVE NOTE   PROCEDURE: left brachiocephalic arteriovenous fistula placement  PRE-OPERATIVE DIAGNOSIS: 1.  CKD nearing dialysis dependence      2. HTN  POST-OPERATIVE DIAGNOSIS: same as above  SURGEON: Festus BarrenJason Russie Gulledge, MD  ASSISTANT(S): none  ANESTHESIA: general  ESTIMATED BLOOD LOSS: 25 cc  FINDING(S): none  SPECIMEN(S):  none  INDICATIONS:   [name] is a 59 y.o. female who presents with chronic renal failure in need of pemanent dialysis acces.  The patient is scheduled for left brachiocephalic arteriovenous fistula placement.  The patient is aware the risks include but are not limited to: bleeding, infection, steal syndrome, nerve damage, ischemic monomelic neuropathy, failure to mature, and need for additional procedures.  The patient is aware of the risks of the procedure and elects to proceed forward.  DESCRIPTION: After full informed written consent was obtained from the patient, the patient was brought back to the operating room and placed supine upon the operating table.  Prior to induction, the patient received IV antibiotics.   After obtaining adequate anesthesia, the patient was then prepped and draped in the standard fashion for a left arm access procedure.  I made a curvilinear incision at the level of the antecubital fossa and dissected through the subcutaneous tissue and fascia to gain exposure of the brachial artery.  This was noted to be patent and adequate in size for fistula creation.  This was dissected out proximally and distally and controlled with vessel loops .  I then dissected out the cephalic vein.  This was noted to be patent and adequate in size for fistula creation.  I then gave the patient 3000 units of intravenous heparin.  The distal segment of the vein was ligated with a  2-0 silk, and the vein was transected.  I then instilled the heparinized saline into the vein and clamped it.  At this point, I reset my  exposure of the brachial artery and pulled up control on the vessel loops.  I made an arteriotomy with a #11 blade, and then I extended the arteriotomy with a Potts scissor.  I injected heparinized saline proximal and distal to this arteriotomy.  The vein was then sewn to the artery in an end-to-side configuration with a running stitch of 6-0 Prolene.  Prior to completing this anastomosis, I allowed the vein and artery to backbleed.  There was no evidence of clot from any vessels.  I completed the anastomosis in the usual fashion and then released all vessel loops and clamps.  There was a palpable  thrill in the venous outflow, and there was a palpable radial pulse.  At this point, I irrigated out the surgical wound.  Surgicel was placed. There was no further active bleeding.  The subcutaneous tissue was reapproximated with a running stitch of 3-0 Vicryl.  The skin was then closed with a 4-0 Monocryl suture.  The skin was then cleaned, dried, and reinforced with Dermabond.  The patient tolerated this procedure well and was taken to the recovery room in stable condition  COMPLICATIONS: none  CONDITION: stable   [name]    11/23/2014, 1:24 PM

## 2014-11-23 NOTE — Anesthesia Procedure Notes (Addendum)
Procedure Name: Intubation Date/Time: 11/23/2014 12:56 PM Performed by: Sherron FlemingsHARVEY, Daeshaun Specht Pre-anesthesia Checklist: Patient identified, Emergency Drugs available, Suction available and Patient being monitored Oxygen Delivery Method: Circle system utilized Preoxygenation: Pre-oxygenation with 100% oxygen Intubation Type: Combination inhalational/ intravenous induction Ventilation: Mask ventilation without difficulty Laryngoscope Size: Mac and 3 Grade View: Grade III Tube type: Oral Tube size: 7.0 mm Number of attempts: 1 Airway Equipment and Method: Stylet Placement Confirmation: ETT inserted through vocal cords under direct vision,  positive ETCO2,  CO2 detector and breath sounds checked- equal and bilateral Secured at: 23 cm Tube secured with: Tape Dental Injury: Teeth and Oropharynx as per pre-operative assessment  Comments: DR. Polin at bedside for CRNA Beane to convert LMA to 7.0 ETT secondary to poor spo2/ oxygenation/ ventilation

## 2014-11-23 NOTE — Progress Notes (Signed)
Bruit heard and thrill felt

## 2014-11-23 NOTE — Anesthesia Postprocedure Evaluation (Signed)
  Anesthesia Post-op Note  Patient: Germaine PomfretPamela H Runk  Procedure(s) Performed: Procedure(s): ARTERIOVENOUS (AV) FISTULA CREATION (Left)  Anesthesia type:General  Patient location: PACU  Post pain: Pain level controlled  Post assessment: Post-op Vital signs reviewed, Patient's Cardiovascular Status Stable, Respiratory Function Stable, Patent Airway and No signs of Nausea or vomiting  Post vital signs: Reviewed and stable  Last Vitals:  Filed Vitals:   11/23/14 1400  BP:   Pulse: 74  Temp:   Resp: 19    Level of consciousness: awake, alert  and patient cooperative  Complications: No apparent anesthesia complications

## 2014-11-23 NOTE — Progress Notes (Signed)
Fingerstick at 13:41  146

## 2014-11-23 NOTE — Discharge Instructions (Signed)
AV Fistula, Care After Refer to this sheet in the next few weeks. These instructions provide you with information on caring for yourself after your procedure. Your caregiver may also give you more specific instructions. Your treatment has been planned according to current medical practices, but problems sometimes occur. Call your caregiver if you have any problems or questions after your procedure. HOME CARE INSTRUCTIONS   Do not drive a car or take public transportation alone.  Do not drink alcohol.  Only take medicine that has been prescribed by your caregiver.  Do not sign important papers or make important decisions.  Have a responsible person with you.  Ask your caregiver to show you how to check your access at home for a vibration (called a "thrill") or for a sound (called a "bruit" pronounced brew-ee).  Your vein will need time to enlarge and mature so needles can be inserted for dialysis. Follow your caregiver's instructions about what you need to do to make this happen.  Keep dressings clean and dry.  Keep the arm elevated above your heart. Use a pillow.  Rest.  Use the arm as usual for all activities.  Have the stitches or tape closures removed in 10 to 14 days, or as directed by your caregiver.  Do not sleep or lie on the area of the fistula or that arm. This may decrease or stop the blood flow through your fistula.  Do not allow blood pressures to be taken on this arm.  Do not allow blood drawing to be done from the graft.  Do not wear tight clothing around the access site or on the arm.  Avoid lifting heavy objects with the arm that has the fistula.  Do not use creams or lotions over the access site. SEEK MEDICAL CARE IF:   You have a fever.  You have swelling around the fistula that gets worse, or you have new pain.  You have unusual bleeding at the fistula site or from any other area.  You have pus or other drainage at the fistula site.  You have skin  redness or red streaking on the skin around, above, or below the fistula site.  Your access site feels warm.  You have any flu-like symptoms. SEEK IMMEDIATE MEDICAL CARE IF:   You have pain, numbness, or an unusual pale skin on the hand or on the side of your fistula.  You have dizziness or weakness that you have not had before.  You have shortness of breath.  You have chest pain.  Your fistula disconnects or breaks, and there is bleeding that cannot be easily controlled. Call for local emergency medical help. Do not try to drive yourself to the hospital. MAKE SURE YOU  Understand these instructions.  Will watch your condition.  Will get help right away if you are not doing well or get worse. Document Released: 07/01/2005 Document Revised: 11/15/2013 Document Reviewed: 12/19/2010 [facility] Patient Information 2015 [employer], [place]. This information is not intended to replace advice given to you by your health care provider. Make sure you discuss any questions you have with your health care provider. General Anesthesia, Care After Refer to this sheet in the next few weeks. These instructions provide you with information on caring for yourself after your procedure. Your health care provider may also give you more specific instructions. Your treatment has been planned according to current medical practices, but problems sometimes occur. Call your health care provider if you have any problems or questions after your  procedure. WHAT TO EXPECT AFTER THE PROCEDURE After the procedure, it is typical to experience:  Sleepiness.  Nausea and vomiting. HOME CARE INSTRUCTIONS  For the first 24 hours after general anesthesia:  Have a responsible person with you.  Do not drive a car. If you are alone, do not take public transportation.  Do not drink alcohol.  Do not take medicine that has not been prescribed by your health care provider.  Do not sign important papers or make important  decisions.  You may resume a normal diet and activities as directed by your health care provider.  Change bandages (dressings) as directed.  If you have questions or problems that seem related to general anesthesia, call the hospital and ask for the anesthetist or anesthesiologist on call. SEEK MEDICAL CARE IF:  You have nausea and vomiting that continue the day after anesthesia.  You develop a rash. SEEK IMMEDIATE MEDICAL CARE IF:   You have difficulty breathing.  You have chest pain.  You have any allergic problems. Document Released: 10/07/2000 Document Revised: 07/06/2013 Document Reviewed: 01/14/2013 [facility]ExitCare Patient Information 2015 [place], [place]. This information is not intended to replace advice given to you by your health care provider. Make sure you discuss any questions you have with your health care provider.

## 2014-11-23 NOTE — H&P (Signed)
[facility] History & Physical Update  The patient was interviewed and re-examined.  The patient's previous History and Physical has been reviewed and is unchanged.  There is no change in the plan of care.  Rashaunda Rahl, MD  11/23/2014, 11:32 AM

## 2014-11-23 NOTE — Transfer of Care (Signed)
Immediate Anesthesia Transfer of Care Note  Patient: [name]  Procedure(s) Performed: Procedure(s): ARTERIOVENOUS (AV) FISTULA CREATION (Left)  Patient Location: PACU  Anesthesia Type:General  Level of Consciousness: awake, alert  and oriented  Airway & Oxygen Therapy: Patient Spontanous Breathing and Patient connected to face mask oxygen  Post-op Assessment: Report given to RN and Post -op Vital signs reviewed and stable  Post vital signs: Reviewed and stable  Last Vitals:  Filed Vitals:   11/23/14 1340  BP: 130/85  Pulse: 84  Temp:   Resp: 13    Complications: No apparent anesthesia complications

## 2014-11-27 ENCOUNTER — Encounter: Payer: Self-pay | Admitting: Vascular Surgery

## 2015-01-17 ENCOUNTER — Telehealth: Payer: Self-pay

## 2015-01-17 NOTE — Telephone Encounter (Signed)
Pharmacy called as patient is trying to get refills on Levimer. I advised 1 month of refills for current dose and [facility] Krebs. [facility]

## 2015-01-25 ENCOUNTER — Ambulatory Visit (INDEPENDENT_AMBULATORY_CARE_PROVIDER_SITE_OTHER): Payer: BC Managed Care – PPO | Admitting: Family Medicine

## 2015-01-25 ENCOUNTER — Encounter: Payer: Self-pay | Admitting: Family Medicine

## 2015-01-25 VITALS — BP 100/57 | HR 74 | Temp 98.5°F | Resp 16 | Ht 68.0 in | Wt 294.0 lb

## 2015-01-25 DIAGNOSIS — L97929 Non-pressure chronic ulcer of unspecified part of left lower leg with unspecified severity: Secondary | ICD-10-CM | POA: Diagnosis not present

## 2015-01-25 DIAGNOSIS — F419 Anxiety disorder, unspecified: Secondary | ICD-10-CM | POA: Diagnosis not present

## 2015-01-25 DIAGNOSIS — L97919 Non-pressure chronic ulcer of unspecified part of right lower leg with unspecified severity: Secondary | ICD-10-CM

## 2015-01-25 DIAGNOSIS — E11622 Type 2 diabetes mellitus with other skin ulcer: Secondary | ICD-10-CM

## 2015-01-25 DIAGNOSIS — L98499 Non-pressure chronic ulcer of skin of other sites with unspecified severity: Secondary | ICD-10-CM | POA: Diagnosis not present

## 2015-01-25 MED ORDER — OXYCODONE-ACETAMINOPHEN 5-325 MG PO TABS: 1.0000 | ORAL_TABLET | Freq: Three times a day (TID) | ORAL | Status: DC | PRN

## 2015-01-25 MED ORDER — LORAZEPAM 0.5 MG PO TABS: 0.5000 mg | ORAL_TABLET | Freq: Every day | ORAL | Status: DC

## 2015-01-25 NOTE — Progress Notes (Signed)
Name: [name]   MRN: [id number]    DOB: 10/20/1955   Date:01/25/2015       Progress Note  Subjective  Chief Complaint  Chief Complaint  Patient presents with  . Foot Ulcer    3 weeks -Just finished Keflex.     HPI  Ulcers of both lower ext, L.>> R.  Present x 2-3 weeks.  Took Keflex x 10 days.  Lots of leg swelling and leg pain now. T2DM and CKD. Husband recently died.<1 week ago. No problem-specific assessment & plan notes found for this encounter.   Past Medical History  Diagnosis Date  . Chronic kidney disease   . Hypertension   . Diabetes mellitus without complication     History  Substance Use Topics  . Smoking status: Never Smoker   . Smokeless tobacco: Never Used  . Alcohol Use: No     Current outpatient prescriptions:  .  amLODipine (NORVASC) 10 MG tablet, Take 5 mg by mouth daily., Disp: , Rfl:  .  calcitRIOL (ROCALTROL) 0.5 MCG capsule, Take 0.5 mcg by mouth daily., Disp: , Rfl:  .  carvedilol (COREG) 3.125 MG tablet, Take 3.125 mg by mouth 2 (two) times daily with a meal., Disp: , Rfl:  .  fluticasone (FLONASE) 50 MCG/ACT nasal spray, Place into both nostrils 2 (two) times daily as needed for allergies or rhinitis., Disp: , Rfl:  .  FREESTYLE LITE test strip, , Disp: , Rfl:  .  furosemide (LASIX) 80 MG tablet, , Disp: , Rfl:  .  hydrALAZINE (APRESOLINE) 25 MG tablet, Take 25 mg by mouth 3 (three) times daily., Disp: , Rfl:  .  HYDROcodone-acetaminophen (NORCO/VICODIN) 5-325 MG per tablet, , Disp: , Rfl:  .  LEVEMIR FLEXTOUCH 100 UNIT/ML Pen, , Disp: , Rfl:  .  loratadine (CLARITIN) 10 MG tablet, Take 10 mg by mouth daily., Disp: , Rfl:  .  losartan (COZAAR) 50 MG tablet, Take 50 mg by mouth daily., Disp: , Rfl:  .  NOVOLOG FLEXPEN 100 UNIT/ML FlexPen, , Disp: , Rfl:  .  ondansetron (ZOFRAN) 4 MG tablet, , Disp: , Rfl:  .  SODIUM BICARBONATE PO, Take 20 g by mouth 2 (two) times daily., Disp: , Rfl:  .  Vitamin D, Ergocalciferol, (DRISDOL) 50000  UNITS CAPS capsule, Take 50,000 Units by mouth every 7 (seven) days. Saturday, Disp: , Rfl:  .  oxyCODONE-acetaminophen (PERCOCET/ROXICET) 5-325 MG per tablet, Take 1 tablet by mouth every 4 (four) hours as needed for moderate pain. (Patient not taking: Reported on 01/25/2015), Disp: 30 tablet, Rfl: 0  Allergies  Allergen Reactions  . Vicodin [Hydrocodone-Acetaminophen] Nausea And Vomiting    Review of Systems  Constitutional: Negative for fever and chills.  Skin:       $ ul;cers on L. Lower leg from 2 cm to 6 cm.  All superficial, but some necrotic  tissue over largest ulcer.  No cellulitis.  Smaller superficial skin breakdown of R. Lower leg.  Psychiatric/Behavioral: Positive for depression. The patient is nervous/anxious.       Objective  Filed Vitals:   01/25/15 1641  BP: 100/57  Pulse: 74  Temp: 98.5 F (36.9 C)  Resp: 16  Height: 5' 8"  (1.727 m)  Weight: 294 lb (133.358 kg)     Physical Exam  Constitutional: She is well-developed, well-nourished, and in no distress.  Skin:  L. Lower leg with 4 ulcers , 1 with some necrotic tissue.  No cellulitis.R. Lower leg with some superficial  skin lesions, not yet ulcers. All dressed with Betadine dressing today.  Psychiatric:  Affect anxious  Vitals reviewed.     Recent Results (from the past 2160 hour(s))  CBC     Status: Abnormal   Collection Time: 11/21/14 10:44 AM  Result Value Ref Range   WBC 10.2 3.6 - 11.0 K/uL   RBC 3.77 (L) 3.80 - 5.20 MIL/uL   Hemoglobin 10.5 (L) 12.0 - 16.0 g/dL   HCT 32.6 (L) 35.0 - 47.0 %   MCV 86.6 80.0 - 100.0 fL   MCH 27.7 26.0 - 34.0 pg   MCHC 32.0 32.0 - 36.0 g/dL   RDW 14.3 11.5 - 14.5 %   Platelets 315 150 - 440 K/uL  Basic metabolic panel     Status: Abnormal   Collection Time: 11/21/14 10:44 AM  Result Value Ref Range   Sodium 140 135 - 145 mmol/L   Potassium 4.9 3.5 - 5.1 mmol/L   Chloride 107 101 - 111 mmol/L   CO2 25 22 - 32 mmol/L   Glucose, Bld 136 (H) 65 - 99 mg/dL    BUN 45 (H) 6 - 20 mg/dL   Creatinine, Ser 4.19 (H) 0.44 - 1.00 mg/dL   Calcium 9.1 8.9 - 10.3 mg/dL   GFR calc non Af Amer 11 (L) >60 mL/min   GFR calc Af Amer 12 (L) >60 mL/min    Comment: (NOTE) The eGFR has been calculated using the CKD EPI equation. This calculation has not been validated in all clinical situations. eGFR's persistently <60 mL/min signify possible Chronic Kidney Disease.    Anion gap 8 5 - 15  Protime-INR     Status: None   Collection Time: 11/21/14 10:44 AM  Result Value Ref Range   Prothrombin Time 14.2 11.4 - 15.0 seconds   INR 1.08   APTT     Status: None   Collection Time: 11/21/14 10:44 AM  Result Value Ref Range   aPTT 30 24 - 36 seconds  Glucose, capillary     Status: Abnormal   Collection Time: 11/23/14 11:06 AM  Result Value Ref Range   Glucose-Capillary 139 (H) 70 - 99 mg/dL  Glucose, capillary     Status: Abnormal   Collection Time: 11/23/14  1:40 PM  Result Value Ref Range   Glucose-Capillary 146 (H) 70 - 99 mg/dL     Assessment & Plan  1. Ulcers of both lower legs -Betadine wet to dry dressings. Referral to [facility] ASAP.  2. Type 2 diabetes mellitus with other skin ulcer   3. Acute anxiety

## 2015-01-25 NOTE — Patient Instructions (Addendum)
Referral to wound care center ASAP for care of leg ulcers.  Change Betadine dressing twice a day. (wet to dry).  Cont. To f/u with Nephrology.

## 2015-01-26 ENCOUNTER — Other Ambulatory Visit: Payer: Self-pay

## 2015-01-26 NOTE — Progress Notes (Unsigned)
This encounter was created in error - please disregard.

## 2015-01-30 ENCOUNTER — Encounter: Payer: BC Managed Care – PPO | Attending: Surgery | Admitting: Surgery

## 2015-01-30 ENCOUNTER — Telehealth: Payer: Self-pay | Admitting: Family Medicine

## 2015-01-30 DIAGNOSIS — E11622 Type 2 diabetes mellitus with other skin ulcer: Secondary | ICD-10-CM | POA: Insufficient documentation

## 2015-01-30 DIAGNOSIS — L97212 Non-pressure chronic ulcer of right calf with fat layer exposed: Secondary | ICD-10-CM | POA: Insufficient documentation

## 2015-01-30 DIAGNOSIS — L97222 Non-pressure chronic ulcer of left calf with fat layer exposed: Secondary | ICD-10-CM | POA: Insufficient documentation

## 2015-01-30 DIAGNOSIS — N184 Chronic kidney disease, stage 4 (severe): Secondary | ICD-10-CM | POA: Diagnosis not present

## 2015-01-30 DIAGNOSIS — Z992 Dependence on renal dialysis: Secondary | ICD-10-CM | POA: Insufficient documentation

## 2015-01-30 DIAGNOSIS — I129 Hypertensive chronic kidney disease with stage 1 through stage 4 chronic kidney disease, or unspecified chronic kidney disease: Secondary | ICD-10-CM | POA: Insufficient documentation

## 2015-01-30 NOTE — Telephone Encounter (Signed)
[name] KannerErrol Britto, MD from wound care called. He saw this patient today. 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 He believes she will benefit from seeing nephrology ASAP for management and work-up. He did not explain this to the patient due to her being very distraught at appt.  If you have questions you can call [phone] or his cell phone at [phone].

## 2015-01-31 NOTE — Progress Notes (Signed)
[name], [name] ([id number]) Visit Report for 01/30/2015 Abuse/Suicide Risk Screen Details Patient Name: [name], [name] 01/30/2015 10:15 Date of Service: AM Medical Record [id number] Number: Patient Account Number: [id number] 03-02-1956 (58 y.o. Treating RN: Curtis Sites Date of Birth/Sex: Female) Other Clinician: Primary Care Physician: Jonna Clark, Fayrene Fearing Treating Britto, Errol Referring Physician: Fidel Levy Physician/Extender: Tania Ade in Treatment: 0 Abuse/Suicide Risk Screen Items Answer ABUSE/SUICIDE RISK SCREEN: Has anyone close to you tried to hurt or harm you recentlyo No Do you feel uncomfortable with anyone in your familyo No Has anyone forced you do things that you didnot want to doo No Do you have any thoughts of harming yourselfo No Patient displays signs or symptoms of abuse and/or neglect. No Electronic Signature(s) Signed: 01/30/2015 4:45:08 PM By: Curtis Sites Entered By: Curtis Sites on 01/30/2015 10:28:21 Macmurray, [name] ([id number]) -------------------------------------------------------------------------------- Activities of Daily Living Details Patient Name: [name], [name] 01/30/2015 10:15 Date of Service: AM Medical Record [id number] Number: Patient Account Number: [id number] [birth year] (58 y.o. Treating RN: Curtis Sites Date of Birth/Sex: Female) Other Clinician: Primary Care Physician: Roger Shelter Referring Physician: Fidel Levy Physician/Extender: Tania Ade in Treatment: 0 Activities of Daily Living Items Answer Activities of Daily Living (Please select one for each item) Drive Automobile Completely Able Take Medications Completely Able Use Telephone Completely Able Care for Appearance Completely Able Use Toilet Completely Able Bath / Shower Completely Able Dress Self Completely Able Feed Self Completely Able Walk Completely Able Get In / Out Bed Completely Able Housework  Completely Able Prepare Meals Completely Able Handle Money Completely Able Shop for Self Completely Able Electronic Signature(s) Signed: 01/30/2015 4:45:08 PM By: Curtis Sites Entered By: Curtis Sites on 01/30/2015 10:28:42 Lisowski, [name] ([id number]) -------------------------------------------------------------------------------- Education Assessment Details Patient Name: [name] Pomfret. 01/30/2015 10:15 Date of Service: AM Medical Record [id number] Number: Patient Account Number: [id number] [birth year] (58 y.o. Treating RN: Curtis Sites Date of Birth/Sex: Female) Other Clinician: Primary Care Physician: Jonna Clark, JAMES Treating Evlyn Kanner Referring Physician: Fidel Levy Physician/Extender: Tania Ade in Treatment: 0 Primary Learner Assessed: Patient Learning Preferences/Education Level/Primary Language Learning Preference: Explanation, Demonstration Highest Education Level: College or Above Preferred Language: English Cognitive Barrier Assessment/Beliefs Language Barrier: No Translator Needed: No Memory Deficit: No Emotional Barrier: No Cultural/Religious Beliefs Affecting Medical No Care: Physical Barrier Assessment Impaired Vision: No Impaired Hearing: No Decreased Hand dexterity: No Knowledge/Comprehension Assessment Knowledge Level: Medium Comprehension Level: Medium Ability to understand written Medium instructions: Ability to understand verbal Medium instructions: Motivation Assessment Anxiety Level: Calm Cooperation: Cooperative Education Importance: Acknowledges Need Interest in Health Problems: Asks Questions Perception: Coherent Willingness to Engage in Self- Medium Management Activities: Readiness to Engage in Self- Medium Management Activities: CECILY, LAWHORNE ([id number]) Electronic Signature(s) Signed: 01/30/2015 4:45:08 PM By: Curtis Sites Entered By: Curtis Sites on 01/30/2015 10:29:11 Voorhies, [name]  ([id number]) -------------------------------------------------------------------------------- Fall Risk Assessment Details Patient Name: [name] Pomfret. 01/30/2015 10:15 Date of Service: AM Medical Record [id number] Number: Patient Account Number: [id number] June 23, 1956 (58 y.o. Treating RN: Curtis Sites Date of Birth/Sex: Female) Other Clinician: Primary Care Physician: Fidel Levy Treating Britto, Errol Referring Physician: Fidel Levy Physician/Extender: Tania Ade in Treatment: 0 Fall Risk Assessment Items FALL RISK ASSESSMENT: History of falling - immediate or within 3 months 0 No Secondary diagnosis 0 No Ambulatory aid None/bed rest/wheelchair/nurse 0 Yes Crutches/cane/walker 0 No Furniture 0 No IV Access/Saline Lock 0 No Gait/Training Normal/bed rest/immobile 0 Yes Weak 0 No Impaired 0 No Mental Status Oriented to  own ability 0 Yes Electronic Signature(s) Signed: 01/30/2015 4:45:08 PM By: Curtis Sitesorthy, Joanna Entered By: Curtis Sitesorthy, Joanna on 01/30/2015 10:29:21 Loria, Aaron EdelmanPAMELA H. ([id number]) -------------------------------------------------------------------------------- Foot Assessment Details Patient Name: [name] PomfretMCDONALD, Leelah H. 01/30/2015 10:15 Date of Service: AM Medical Record [id number] Number: Patient Account Number: [id number] 09-Nov-1955 (58 y.o. Treating RN: Curtis Sitesorthy, Joanna Date of Birth/Sex: Female) Other Clinician: Primary Care Physician: Jonna ClarkHAWKINS JR, Fayrene FearingJAMES Treating Britto, Errol Referring Physician: Fidel LevyHAWKINS JR, JAMES Physician/Extender: Tania AdeWeeks in Treatment: 0 Foot Assessment Items Site Locations + = Sensation present, - = Sensation absent, C = Callus, U = Ulcer R = Redness, W = Warmth, M = Maceration, PU = Pre-ulcerative lesion F = Fissure, S = Swelling, D = Dryness Assessment Right: Left: Other Deformity: No No Prior Foot Ulcer: No No Prior Amputation: No No Charcot Joint: No No Ambulatory Status: Ambulatory Without Help Gait:  Steady Electronic Signature(s) Signed: 01/30/2015 4:45:08 PM By: Curtis Sitesorthy, Joanna Entered By: Curtis Sitesorthy, Joanna on 01/30/2015 10:33:42 Nin, Aaron EdelmanPAMELA H. ([id number]) -------------------------------------------------------------------------------- Nutrition Risk Assessment Details Patient Name: [name] PomfretMCDONALD, [place] H. 01/30/2015 10:15 Date of Service: AM Medical Record [id number] Number: Patient Account Number: [id number] 09-Nov-1955 (58 y.o. Treating RN: Curtis Sitesorthy, Joanna Date of Birth/Sex: Female) Other Clinician: Primary Care Physician: Fidel LevyHAWKINS JR, JAMES Treating Britto, Errol Referring Physician: Fidel LevyHAWKINS JR, JAMES Physician/Extender: Tania AdeWeeks in Treatment: 0 Height (in): 68 Weight (lbs): 294 Body Mass Index (BMI): 44.7 Nutrition Risk Assessment Items NUTRITION RISK SCREEN: I have an illness or condition that made me change the kind and/or 0 No amount of food I eat I eat fewer than two meals per day 0 No I eat few fruits and vegetables, or milk products 0 No I have three or more drinks of beer, liquor or wine almost every day 0 No I have tooth or mouth problems that make it hard for me to eat 0 No I don't always have enough money to buy the food I need 0 No I eat alone most of the time 0 No I take three or more different prescribed or over-the-counter drugs a 1 Yes day Without wanting to, I have lost or gained 10 pounds in the last six 0 No months I am not always physically able to shop, cook and/or feed myself 0 No Nutrition Protocols Good Risk Protocol 0 No interventions needed Moderate Risk Protocol Electronic Signature(s) Signed: 01/30/2015 4:45:08 PM By: Curtis Sitesorthy, Joanna Entered By: Curtis Sitesorthy, Joanna on 01/30/2015 32:44:0110:29:28

## 2015-01-31 NOTE — Telephone Encounter (Signed)
I have talked to patient and she will contact Nephrology.  She is supposed to start dialysis soon.-jh

## 2015-01-31 NOTE — Progress Notes (Addendum)
BAMA, HANSELMAN ([id number]) Visit Report for 01/30/2015 Chief Complaint Document Details Patient Name: [name], [name] 01/30/2015 10:15 Date of Service: AM Medical Record [id number] Number: Patient Account Number: [id number] [birth year] (58 y.o. Treating RN: Date of Birth/Sex: Female) Other Clinician: Primary Care Physician: Jonna Clark, JAMES Treating Evlyn Kanner Referring Physician: Fidel Levy Physician/Extender: Tania Ade in Treatment: 0 Information Obtained from: Patient Chief Complaint Patient presents to the wound care center for a consult due non healing wound. This 59 year old patient has had ulcerated areas on the left lower extremity more than the right lower extremity to very painful for the last 3 weeks. Electronic Signature(s) Signed: 01/30/2015 11:55:30 AM By: Evlyn Kanner MD, FACS Entered By: Evlyn Kanner on 01/30/2015 11:55:30 [place], [name] ([id number]) -------------------------------------------------------------------------------- HPI Details Patient Name: [name], [name] 01/30/2015 10:15 Date of Service: AM Medical Record [id number] Number: Patient Account Number: [id number] 12-31-1955 (58 y.o. Treating RN: Date of Birth/Sex: Female) Other Clinician: Primary Care Physician: Jonna Clark, JAMES Treating Evlyn Kanner Referring Physician: Fidel Levy Physician/Extender: Weeks in Treatment: 0 History of Present Illness Location: Painful ulcerated areas left lower extremity more than right lower extremity Quality: Patient reports experiencing a sharp pain to affected area(s). Severity: Patient states wound are getting worse. Duration: Patient has had the wound for < 4 weeks prior to presenting for treatment Timing: Pain in wound is Intermittent (comes and goes Context: The wound appeared gradually over time Modifying Factors: Other treatment(s) tried include: she has had Keflex for about 10 days. Associated Signs and Symptoms:  Patient reports having difficulty standing for long periods. HPI Description: 59 year old patient recently seen by her PCP Dr. Venora Maples who saw her on 01/25/2015 4 ulcers both lower extremities which have been present for about 3 weeks. She took a ten-day course of Keflex and continues to have leg pain and swelling. Past medical history significant for chronic kidney disease, hypertension and diabetes mellitus without complications. She has never been a smoker. Mostly recent CBC was within normal limits, BMP showed that her glucose was 136 and a beard was 45 and a creatinine is 4.19. The patient has had chronic renal failure and had a left AV fistula placed by Dr. dew in May and she is getting ready for hemodialysis soon. She has not been put on any specific medications for this problem. Addendum: tried to get Dr. 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 all questions answered. Electronic Signature(s) Signed: 01/30/2015 12:43:45 PM By: Evlyn Kanner MD, FACS Previous Signature: 01/30/2015 11:57:31 AM Version By: Evlyn Kanner MD, FACS Previous Signature: 01/30/2015 11:39:26 AM Version By: Evlyn Kanner MD, FACS Previous Signature: 01/30/2015 11:07:54 AM Version By: Evlyn Kanner MD, FACS Previous Signature: 01/30/2015 11:02:43 AM Version By: Evlyn Kanner MD, FACS Previous Signature: 01/30/2015 11:02:28 AM Version By: Evlyn Kanner MD, FACS Entered By: Evlyn Kanner on 01/30/2015 12:43:44 [name], [name] ([id number]) -------------------------------------------------------------------------------- Physical Exam Details Patient Name: [name], [name] 01/30/2015 10:15 Date of Service: AM Medical Record [id number] Number: Patient Account Number: [id number] 07-09-1956 (58 y.o. Treating RN: Date of Birth/Sex: Female) Other  Clinician: Primary Care Physician: Jonna Clark, JAMES Treating Evlyn Kanner Referring Physician: Fidel Levy Physician/Extender: Weeks in Treatment: 0 Constitutional . Pulse regular. Respirations normal and unlabored. Afebrile. . Eyes Nonicteric. Reactive to light. Ears, Nose, Mouth, and Throat Lips, teeth, and  gums WNL.Marland Kitchen Moist mucosa without lesions . Neck supple and nontender. No palpable supraclavicular or cervical adenopathy. Normal sized without goiter. Respiratory WNL. No retractions.. Cardiovascular Pedal Pulses WNL. her arteries were noncompressible and her ABI could not be measured.. he has significant pitting edema both lower extremities and she is quite painful to touch.. Gastrointestinal (GI) Abdomen without masses or tenderness.. No liver or spleen enlargement or tenderness.. Musculoskeletal Adexa without tenderness or enlargement.. Digits and nails w/o clubbing, cyanosis, infection, petechiae, ischemia, or inflammatory conditions.. Integumentary (Hair, Skin) No suspicious lesions. No crepitus or fluctuance. No peri-wound warmth or erythema. No masses.Marland Kitchen Psychiatric Judgement and insight Intact.. No evidence of depression, anxiety, or agitation.. Notes The left leg has significant necrotic ulcerations which are covered with eschar and are typical of calciphylaxis. They're very tender to touch. Her right leg is not so tender and symptomatic. Electronic Signature(s) Signed: 01/30/2015 11:59:00 AM By: Evlyn Kanner MD, FACS Entered By: Evlyn Kanner on 01/30/2015 11:59:00 Basista, [name] ([id number]) -------------------------------------------------------------------------------- Physician Orders Details Patient Name: [name], [name] 01/30/2015 10:15 Date of Service: AM Medical Record [id number] Number: Patient Account Number: [id number] May 22, 1956 (58 y.o. Treating RN: Curtis Sites Date of Birth/Sex: Female) Other Clinician: Primary Care Physician:  Roger Shelter Referring Physician: Fidel Levy Physician/Extender: Tania Ade in Treatment: 0 Verbal / Phone Orders: Yes Clinician: Curtis Sites Read Back and Verified: Yes Diagnosis Coding Wound Cleansing Wound #1 Right,Proximal Lower Leg o Clean wound with Normal Saline. o May Shower, gently pat wound dry prior to applying new dressing. Wound #2 Right,Distal Lower Leg o Clean wound with Normal Saline. o May Shower, gently pat wound dry prior to applying new dressing. Wound #3 Left,Proximal,Posterior Lower Leg o Clean wound with Normal Saline. o May Shower, gently pat wound dry prior to applying new dressing. Wound #4 Left,Distal,Posterior Lower Leg o Clean wound with Normal Saline. o May Shower, gently pat wound dry prior to applying new dressing. Wound #5 Left,Proximal,Lateral Lower Leg o Clean wound with Normal Saline. o May Shower, gently pat wound dry prior to applying new dressing. Wound #6 Left,Distal,Lateral Lower Leg o Clean wound with Normal Saline. o May Shower, gently pat wound dry prior to applying new dressing. Anesthetic Wound #1 Right,Proximal Lower Leg o Topical Lidocaine 4% cream applied to wound bed prior to debridement Wound #2 Right,Distal Lower Leg o Topical Lidocaine 4% cream applied to wound bed prior to debridement Wound #3 Left,Proximal,Posterior Lower Leg o Topical Lidocaine 4% cream applied to wound bed prior to debridement Wound #4 Left,Distal,Posterior Lower Leg Mccree, Atheena H. ([id number]) o Topical Lidocaine 4% cream applied to wound bed prior to debridement Wound #5 Left,Proximal,Lateral Lower Leg o Topical Lidocaine 4% cream applied to wound bed prior to debridement Wound #6 Left,Distal,Lateral Lower Leg o Topical Lidocaine 4% cream applied to wound bed prior to debridement Primary Wound Dressing Wound #1 Right,Proximal Lower Leg o Hydrogel - with Ag Wound #2  Right,Distal Lower Leg o Hydrogel - with Ag Wound #3 Left,Proximal,Posterior Lower Leg o Hydrogel - with Ag Wound #4 Left,Distal,Posterior Lower Leg o Hydrogel - with Ag Wound #5 Left,Proximal,Lateral Lower Leg o Hydrogel - with Ag Wound #6 Left,Distal,Lateral Lower Leg o Hydrogel - with Ag Secondary Dressing Wound #1 Right,Proximal Lower Leg o Gauze, ABD and Kerlix/Conform Wound #2 Right,Distal Lower Leg o Gauze, ABD and Kerlix/Conform Wound #3 Left,Proximal,Posterior Lower Leg o Gauze, ABD and Kerlix/Conform Wound #4 Left,Distal,Posterior Lower Leg o Gauze, ABD and Kerlix/Conform Wound #5 Left,Proximal,Lateral Lower Leg o  Gauze, ABD and Kerlix/Conform Wound #6 Left,Distal,Lateral Lower Leg o Gauze, ABD and Kerlix/Conform Dressing Change Frequency Wound #1 Right,Proximal Lower Leg Barot, Kaislyn H. ([id number]) o Change dressing every day. Wound #2 Right,Distal Lower Leg o Change dressing every day. Wound #3 Left,Proximal,Posterior Lower Leg o Change dressing every day. Wound #4 Left,Distal,Posterior Lower Leg o Change dressing every day. Wound #5 Left,Proximal,Lateral Lower Leg o Change dressing every day. Wound #6 Left,Distal,Lateral Lower Leg o Change dressing every day. Follow-up Appointments Wound #1 Right,Proximal Lower Leg o Return Appointment in 1 week. Wound #2 Right,Distal Lower Leg o Return Appointment in 1 week. Wound #3 Left,Proximal,Posterior Lower Leg o Return Appointment in 1 week. Wound #4 Left,Distal,Posterior Lower Leg o Return Appointment in 1 week. Wound #5 Left,Proximal,Lateral Lower Leg o Return Appointment in 1 week. Wound #6 Left,Distal,Lateral Lower Leg o Return Appointment in 1 week. Edema Control Wound #1 Right,Proximal Lower Leg o Tubigrip Wound #2 Right,Distal Lower Leg o Tubigrip Wound #3 Left,Proximal,Posterior Lower Leg o Tubigrip Wound #4 Left,Distal,Posterior Lower  Leg o Tubigrip [name], [name] ([id number]) Wound #5 Left,Proximal,Lateral Lower Leg o Tubigrip Wound #6 Left,Distal,Lateral Lower Leg o Tubigrip Home Health Wound #1 Right,Proximal Lower Leg o Initiate Home Health for Skilled Nursing o Home Health Nurse may visit PRN to address patientos wound care needs. o FACE TO FACE ENCOUNTER: MEDICARE and MEDICAID PATIENTS: I certify that this patient is under my care and that I had a face-to-face encounter that meets the physician face-to-face encounter requirements with this patient on this date. The encounter with the patient was in whole or in part for the following MEDICAL CONDITION: (primary reason for Home Healthcare) MEDICAL NECESSITY: I certify, that based on my findings, NURSING services are a medically necessary home health service. HOME BOUND STATUS: I certify that my clinical findings support that this patient is homebound (i.e., Due to illness or injury, pt requires aid of supportive devices such as crutches, cane, wheelchairs, walkers, the use of special transportation or the assistance of another person to leave their place of residence. There is a normal inability to leave the home and doing so requires considerable and taxing effort. Other absences are for medical reasons / religious services and are infrequent or of short duration when for other reasons). o If current dressing causes regression in wound condition, may D/C ordered dressing product/s and apply Normal Saline Moist Dressing daily until next Wound Healing Center / Other MD appointment. Notify Wound Healing Center of regression in wound condition at [phone]. o Please direct any NON-WOUND related issues/requests for orders to patient's Primary Care Physician Wound #2 Right,Distal Lower Leg o Initiate Home Health for Skilled Nursing o Home Health Nurse may visit PRN to address patientos wound care needs. o FACE TO FACE ENCOUNTER: MEDICARE  and MEDICAID PATIENTS: I certify that this patient is under my care and that I had a face-to-face encounter that meets the physician face-to-face encounter requirements with this patient on this date. The encounter with the patient was in whole or in part for the following MEDICAL CONDITION: (primary reason for Home Healthcare) MEDICAL NECESSITY: I certify, that based on my findings, NURSING services are a medically necessary home health service. HOME BOUND STATUS: I certify that my clinical findings support that this patient is homebound (i.e., Due to illness or injury, pt requires aid of supportive devices such as crutches, cane, wheelchairs, walkers, the use of special transportation or the assistance of another person to leave their place of residence. There is a  normal inability to leave the home and doing so requires considerable and taxing effort. Other absences are for medical reasons / religious services and are infrequent or of short duration when for other reasons). o If current dressing causes regression in wound condition, may D/C ordered dressing product/s and apply Normal Saline Moist Dressing daily until next Wound Healing Center / Other MD appointment. Notify Wound Healing Center of regression in wound condition at [phone]. o Please direct any NON-WOUND related issues/requests for orders to patient's Primary Care Physician [name], [name] ([id number]) Wound #3 Left,Proximal,Posterior Lower Leg o Initiate Home Health for Skilled Nursing o Home Health Nurse may visit PRN to address patientos wound care needs. o FACE TO FACE ENCOUNTER: MEDICARE and MEDICAID PATIENTS: I certify that this patient is under my care and that I had a face-to-face encounter that meets the physician face-to-face encounter requirements with this patient on this date. The encounter with the patient was in whole or in part for the following MEDICAL CONDITION: (primary reason for Home  Healthcare) MEDICAL NECESSITY: I certify, that based on my findings, NURSING services are a medically necessary home health service. HOME BOUND STATUS: I certify that my clinical findings support that this patient is homebound (i.e., Due to illness or injury, pt requires aid of supportive devices such as crutches, cane, wheelchairs, walkers, the use of special transportation or the assistance of another person to leave their place of residence. There is a normal inability to leave the home and doing so requires considerable and taxing effort. Other absences are for medical reasons / religious services and are infrequent or of short duration when for other reasons). o If current dressing causes regression in wound condition, may D/C ordered dressing product/s and apply Normal Saline Moist Dressing daily until next Wound Healing Center / Other MD appointment. Notify Wound Healing Center of regression in wound condition at [phone]. o Please direct any NON-WOUND related issues/requests for orders to patient's Primary Care Physician Wound #4 Left,Distal,Posterior Lower Leg o Initiate Home Health for Skilled Nursing o Home Health Nurse may visit PRN to address patientos wound care needs. o FACE TO FACE ENCOUNTER: MEDICARE and MEDICAID PATIENTS: I certify that this patient is under my care and that I had a face-to-face encounter that meets the physician face-to-face encounter requirements with this patient on this date. The encounter with the patient was in whole or in part for the following MEDICAL CONDITION: (primary reason for Home Healthcare) MEDICAL NECESSITY: I certify, that based on my findings, NURSING services are a medically necessary home health service. HOME BOUND STATUS: I certify that my clinical findings support that this patient is homebound (i.e., Due to illness or injury, pt requires aid of supportive devices such as crutches, cane, wheelchairs, walkers, the use of  special transportation or the assistance of another person to leave their place of residence. There is a normal inability to leave the home and doing so requires considerable and taxing effort. Other absences are for medical reasons / religious services and are infrequent or of short duration when for other reasons). o If current dressing causes regression in wound condition, may D/C ordered dressing product/s and apply Normal Saline Moist Dressing daily until next Wound Healing Center / Other MD appointment. Notify Wound Healing Center of regression in wound condition at [phone]. o Please direct any NON-WOUND related issues/requests for orders to patient's Primary Care Physician Wound #5 Left,Proximal,Lateral Lower Leg o Initiate Home Health for Skilled Nursing o Home Health Nurse [phone]  visit PRN to address patientos wound care needs. o FACE TO FACE ENCOUNTER: MEDICARE and MEDICAID PATIENTS: I certify that this patient is under my care and that I had a face-to-face encounter that meets the physician face-to-face encounter requirements with this patient on this date. The encounter with the patient was in whole or in part for the following MEDICAL CONDITION: (primary reason for Home Healthcare) [name], [name] ([id number]) MEDICAL NECESSITY: I certify, that based on my findings, NURSING services are a medically necessary home health service. HOME BOUND STATUS: I certify that my clinical findings support that this patient is homebound (i.e., Due to illness or injury, pt requires aid of supportive devices such as crutches, cane, wheelchairs, walkers, the use of special transportation or the assistance of another person to leave their place of residence. There is a normal inability to leave the home and doing so requires considerable and taxing effort. Other absences are for medical reasons / religious services and are infrequent or of short duration when for other reasons). o  If current dressing causes regression in wound condition, may D/C ordered dressing product/s and apply Normal Saline Moist Dressing daily until next Wound Healing Center / Other MD appointment. Notify Wound Healing Center of regression in wound condition at [phone]. o Please direct any NON-WOUND related issues/requests for orders to patient's Primary Care Physician Wound #6 Left,Distal,Lateral Lower Leg o Initiate Home Health for Skilled Nursing o Home Health Nurse may visit PRN to address patientos wound care needs. o FACE TO FACE ENCOUNTER: MEDICARE and MEDICAID PATIENTS: I certify that this patient is under my care and that I had a face-to-face encounter that meets the physician face-to-face encounter requirements with this patient on this date. The encounter with the patient was in whole or in part for the following MEDICAL CONDITION: (primary reason for Home Healthcare) MEDICAL NECESSITY: I certify, that based on my findings, NURSING services are a medically necessary home health service. HOME BOUND STATUS: I certify that my clinical findings support that this patient is homebound (i.e., Due to illness or injury, pt requires aid of supportive devices such as crutches, cane, wheelchairs, walkers, the use of special transportation or the assistance of another person to leave their place of residence. There is a normal inability to leave the home and doing so requires considerable and taxing effort. Other absences are for medical reasons / religious services and are infrequent or of short duration when for other reasons). o If current dressing causes regression in wound condition, may D/C ordered dressing product/s and apply Normal Saline Moist Dressing daily until next Wound Healing Center / Other MD appointment. Notify Wound Healing Center of regression in wound condition at [phone]. o Please direct any NON-WOUND related issues/requests for orders to patient's Primary  Care Physician Services and Therapies o Arterial Studies- Bilateral oooo o Venous Studies -Bilateral [name], [name] ([id number]) oooo Electronic Signature(s) Signed: 01/30/2015 12:32:38 PM By: Evlyn Kanner MD, FACS Signed: 01/30/2015 4:45:08 PM By: Curtis Sites Entered By: Curtis Sites on 01/30/2015 11:26:58 Stiger, [name] ([id number]) -------------------------------------------------------------------------------- Problem List Details Patient Name: BRENDALY, TOWNSEL 01/30/2015 10:15 Date of Service: AM Medical Record [id number] Number: Patient Account Number: [id number] 11-03-1955 (58 y.o. 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 01/30/2015 10:15 Date of Service: AM Medical Record [id number] Number: Patient Account Number: [id number] 02/17/1956 (58 y.o. Treating RN: Date of Birth/Sex: Female) Other Clinician: Primary Care Physician: Jonna Clark, JAMES Treating Evlyn Kanner Referring Physician: Fidel Levy Physician/Extender: Tania Ade in Treatment: 0 Subjective Chief  Complaint Information obtained from Patient Patient presents to the wound care center for a consult due non healing wound. This 59 year old patient has had ulcerated areas on the left lower extremity more than the right lower extremity to very painful for the last 3 weeks. History of Present Illness (HPI) The following HPI elements were documented for the patient's wound: Location: Painful ulcerated areas left lower extremity more than right lower extremity Quality: Patient reports experiencing a sharp pain to affected area(s). Severity: Patient states wound are getting worse. Duration: Patient has had the wound for < 4 weeks prior to presenting for treatment Timing: Pain in wound is Intermittent (comes and goes Context: The wound appeared gradually over time Modifying Factors: Other treatment(s) tried include: she has had Keflex for about 10 days. Associated Signs and Symptoms: Patient reports having difficulty standing for long periods. 59 year old patient recently seen by her PCP Dr. Venora Maples who saw her on 01/25/2015 4 ulcers both lower extremities which have been present for about 3 weeks. She took a ten-day course of Keflex and continues to have leg pain and swelling. Past medical history significant for chronic kidney disease, hypertension and diabetes mellitus without complications. She has never been a smoker. Mostly recent CBC was within normal limits, BMP showed that her glucose was 136 and a beard was 45 and a creatinine is 4.19. The patient has had chronic renal failure and had a left AV fistula placed by Dr. dew in May and she is getting ready for hemodialysis soon. She has not been put on any specific medications for this problem. Addendum: tried to get Dr. 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 all  questions answered. Wound History Patient presents with 6 open wounds that have been present for approximately 3 weeks. Patient has been treating wounds in the following manner: iodine and kerlix. Laboratory tests have not been performed in the last month. Patient reportedly has not tested positive for an antibiotic resistant organism. Patient reportedly has not tested positive for osteomyelitis. Patient reportedly has not had testing performed to evaluate Andringa, Leandrea H. ([id number]) circulation in the legs. Patient experiences the following problems associated with their wounds: swelling. Patient History Information obtained from Patient. Allergies Vicodin Family History No family history of Cancer, Diabetes, Heart Disease, Hereditary Spherocytosis, Hypertension, Kidney Disease, Lung Disease, Seizures, Stroke, Thyroid Problems, Tuberculosis. Social History Never smoker, Marital Status - Widowed, Alcohol Use - Never, Drug Use - No History, Caffeine Use - Never. Medical History Cardiovascular Patient has history of Hypertension Endocrine Patient has history of Type II Diabetes Oncologic Denies history of Received Chemotherapy, Received Radiation Patient is treated with Insulin. Blood sugar is tested.  Review of Systems (ROS) Constitutional Symptoms (General Health) The patient has no complaints or symptoms. Eyes The patient has no complaints or symptoms. Ear/Nose/Mouth/Throat The patient has no complaints or symptoms. Hematologic/Lymphatic The patient has no complaints or symptoms. Respiratory The patient has no complaints or symptoms. Cardiovascular Complains or has symptoms of LE edema. Gastrointestinal The patient has no complaints or symptoms. Genitourinary Complains or has symptoms of Kidney failure/ Dialysis - CKD. Immunological The patient has no complaints or symptoms. Integumentary (Skin) Complains or has symptoms of Wounds. Musculoskeletal The patient has  no complaints or symptoms. Neurologic Coates, Miguel H. ([id number]) The patient has no complaints or symptoms. Oncologic The patient has no complaints or symptoms. Psychiatric The patient has no complaints or symptoms. Objective Constitutional Pulse regular. Respirations normal and unlabored. Afebrile. Vitals Time Taken: 10:22 AM, Height: 68 in, Source: Stated, Weight: 294 lbs, Source: Stated, BMI: 44.7, Temperature: 97.8 F, Pulse: 68 bpm, Respiratory Rate: 18 breaths/min, Blood Pressure: 118/56 mmHg. Eyes Nonicteric. Reactive to light. Ears, Nose, Mouth, and Throat Lips, teeth, and gums WNL.Marland Kitchen Moist mucosa without lesions . Neck supple and nontender. No palpable supraclavicular or cervical adenopathy. Normal sized without goiter. Respiratory WNL. No retractions.. Cardiovascular Pedal Pulses WNL. her arteries were noncompressible and her ABI could not be measured.. he has significant pitting edema both lower extremities and she is quite painful to touch.. Gastrointestinal (GI) Abdomen without masses or tenderness.. No liver or spleen enlargement or tenderness.. Musculoskeletal Adexa without tenderness or enlargement.. Digits and nails w/o clubbing, cyanosis, infection, petechiae, ischemia, or inflammatory conditions.Marland Kitchen Psychiatric Judgement and insight Intact.. No evidence of depression, anxiety, or agitation.. 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 They're very tender to touch. Her right leg is not so tender and symptomatic. Girtman, Tokiko H. ([id number]) Integumentary (Hair, Skin) No suspicious lesions. No crepitus or fluctuance. No peri-wound warmth or erythema. No masses.. Wound #1 status is Open. Original cause of wound was Gradually Appeared. The wound is located on the Right,Proximal Lower Leg. The wound measures 0.3cm length x 0.2cm width x 0.1cm depth; 0.047cm^2 area and  0.005cm^3 volume. The wound is limited to skin breakdown. There is no tunneling or undermining noted. There is a small amount of serous drainage noted. The wound margin is flat and intact. There is no granulation within the wound bed. There is a large (67-100%) amount of necrotic tissue within the wound bed including Eschar. The periwound skin appearance did not exhibit: Callus, Crepitus, Excoriation, Fluctuance, Friable, Induration, Localized Edema, Rash, Scarring, Dry/Scaly, Maceration, Moist, Atrophie Blanche, Cyanosis, Ecchymosis, Hemosiderin Staining, Mottled, Pallor, Rubor, Erythema. Periwound temperature was noted as No Abnormality. The periwound has tenderness on palpation. Wound #2 status is Open. Original cause of wound was Gradually Appeared. The wound is located on the Right,Distal Lower Leg. The wound measures 0.4cm length x 0.3cm width x 0.1cm depth; 0.094cm^2 area and 0.009cm^3 volume. The wound is limited to skin breakdown. There is no tunneling or undermining noted. There is a small amount of serous drainage noted. The wound margin is flat and intact. There is no granulation within the wound bed. There is a large (67-100%) amount of necrotic tissue within the wound bed including Eschar. The periwound skin appearance did not exhibit: Callus, Crepitus, Excoriation, Fluctuance, Friable, Induration, Localized Edema, Rash, Scarring, Dry/Scaly, Maceration, Moist, Atrophie Blanche, Cyanosis, Ecchymosis, Hemosiderin Staining, Mottled, Pallor, Rubor, Erythema. Periwound temperature was noted as No Abnormality. The periwound has tenderness on palpation.  Wound #3 status is Open. Original cause of wound was Gradually Appeared. The wound is located on the Left,Proximal,Posterior Lower Leg. The wound measures 4cm length x 3.5cm width x 0.1cm depth; 10.996cm^2 area and 1.1cm^3 volume. The wound is limited to skin breakdown. There is no tunneling or undermining noted. There is a medium amount of  serous drainage noted. The wound margin is flat and intact. There is no granulation within the wound bed. There is a large (67-100%) amount of necrotic tissue within the wound bed including Eschar and Adherent Slough. The periwound skin appearance did not exhibit: Callus, Crepitus, Excoriation, Fluctuance, Friable, Induration, Localized Edema, Rash, Scarring, Dry/Scaly, Maceration, Moist, Atrophie Blanche, Cyanosis, Ecchymosis, Hemosiderin Staining, Mottled, Pallor, Rubor, Erythema. Periwound temperature was noted as No Abnormality. The periwound has tenderness on palpation. Wound #4 status is Open. Original cause of wound was Gradually Appeared. The wound is located on the Left,Distal,Posterior Lower Leg. The wound measures 5.8cm length x 3.7cm width x 0.1cm depth; 16.855cm^2 area and 1.685cm^3 volume. The wound is limited to skin breakdown. There is no tunneling or undermining noted. There is a medium amount of serous drainage noted. The wound margin is flat and intact. There is no granulation within the wound bed. There is a large (67-100%) amount of necrotic tissue within the wound bed including Eschar and Adherent Slough. The periwound skin appearance did not exhibit: Callus, Crepitus, Excoriation, Fluctuance, Friable, Induration, Localized Edema, Rash, Scarring, Dry/Scaly, Maceration, Moist, Atrophie Blanche, Cyanosis, Ecchymosis, Hemosiderin Staining, Mottled, Pallor, Rubor, Erythema. Periwound temperature was noted as No Abnormality. The periwound has tenderness on palpation. Wound #5 status is Open. Original cause of wound was Gradually Appeared. The wound is located on the Left,Proximal,Lateral Lower Leg. The wound measures 3.6cm length x 3.8cm width x 0.1cm depth; 10.744cm^2 area and 1.074cm^3 volume. The wound is limited to skin breakdown. There is no tunneling or undermining noted. There is a medium amount of serous drainage noted. The wound margin is flat and intact. There is no  granulation within the wound bed. There is a large (67-100%) amount of necrotic tissue within the wound bed including Eschar and Adherent Slough. The periwound skin appearance did not Kinlaw, Areli H. ([id number]) exhibit: Callus, Crepitus, Excoriation, Fluctuance, Friable, Induration, Localized Edema, Rash, Scarring, Dry/Scaly, Maceration, Moist, Atrophie Blanche, Cyanosis, Ecchymosis, Hemosiderin Staining, Mottled, Pallor, Rubor, Erythema. Periwound temperature was noted as No Abnormality. The periwound has tenderness on palpation. Wound #6 status is Open. Original cause of wound was Gradually Appeared. The wound is located on the Left,Distal,Lateral Lower Leg. The wound measures 5.7cm length x 3.2cm width x 0.1cm depth; 14.326cm^2 area and 1.433cm^3 volume. The wound is limited to skin breakdown. There is no tunneling or undermining noted. There is a medium amount of serous drainage noted. The wound margin is flat and intact. There is no granulation within the wound bed. There is a large (67-100%) amount of necrotic tissue within the wound bed including Eschar and Adherent Slough. The periwound skin appearance did not exhibit: Callus, Crepitus, Excoriation, Fluctuance, Friable, Induration, Localized Edema, Rash, Scarring, Dry/Scaly, Maceration, Moist, Atrophie Blanche, Cyanosis, Ecchymosis, Hemosiderin Staining, Mottled, Pallor, Rubor, Erythema. Periwound temperature was noted as No Abnormality. The periwound has tenderness on palpation. 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 She has chronic renal failure and is having dialysis started shortly. The ulcerated areas are rather dry and painful and I have recommended hydrogel with silver applications and a light dressing over this. I have recommended both arterial and venous studies to be done as soon as possible and I will talk to her PCP regarding her management medically and discuss her seeing the nephrologist as soon as possible. She will see me back next week. Plan Wound Cleansing: Wound #1 Right,Proximal Lower Leg: Jurek, Melicia H. ([id number]) Clean wound with Normal Saline. May Shower, gently pat wound dry prior to applying new dressing. Wound #2 Right,Distal Lower Leg: Clean wound with Normal Saline. May Shower, gently pat wound dry prior to applying new dressing. Wound #3 Left,Proximal,Posterior Lower Leg: Clean wound with Normal Saline. May Shower, gently pat wound dry prior to applying new dressing. Wound #4 Left,Distal,Posterior Lower Leg: Clean wound with Normal Saline. May Shower, gently pat wound dry prior to applying new dressing. Wound #5 Left,Proximal,Lateral Lower Leg: Clean wound with Normal Saline. May Shower, gently pat wound dry prior to applying new dressing. Wound #6 Left,Distal,Lateral Lower Leg: Clean wound with Normal Saline. May Shower, gently pat wound dry prior to applying new dressing. Anesthetic: Wound #1 Right,Proximal Lower Leg: Topical Lidocaine 4% cream applied to wound bed prior to debridement Wound #2 Right,Distal Lower Leg: Topical Lidocaine 4% cream applied to wound bed prior to debridement Wound #3 Left,Proximal,Posterior Lower Leg: Topical Lidocaine 4% cream applied to wound bed prior to debridement Wound #4 Left,Distal,Posterior Lower Leg: Topical Lidocaine 4% cream applied to wound bed prior to debridement Wound #5 Left,Proximal,Lateral Lower Leg: Topical Lidocaine 4% cream applied to wound bed prior to debridement Wound #6  Left,Distal,Lateral Lower Leg: Topical Lidocaine 4% cream applied to wound bed prior to debridement Primary Wound Dressing: Wound #1 Right,Proximal Lower Leg: Hydrogel - with Ag Wound #2 Right,Distal Lower Leg: Hydrogel - with Ag Wound #3 Left,Proximal,Posterior Lower Leg: Hydrogel - with Ag Wound #4 Left,Distal,Posterior Lower Leg: Hydrogel - with Ag Wound #5 Left,Proximal,Lateral Lower Leg: Hydrogel - with Ag Wound #6 Left,Distal,Lateral Lower Leg: Hydrogel - with Ag Secondary Dressing: Wound #1 Right,Proximal Lower Leg: Gauze, ABD and Kerlix/Conform Wound #2 Right,Distal Lower Leg: Gauze, ABD and Kerlix/Conform Wound #3 Left,Proximal,Posterior Lower Leg: Gauze, ABD and Kerlix/Conform Wound #4 Left,Distal,Posterior Lower Leg: Almgren, Afsheen H. ([id number]) Gauze, ABD and Kerlix/Conform Wound #5 Left,Proximal,Lateral Lower Leg: Gauze, ABD and Kerlix/Conform Wound #6 Left,Distal,Lateral Lower Leg: Gauze, ABD and Kerlix/Conform Dressing Change Frequency: Wound #1 Right,Proximal Lower Leg: Change dressing every day. Wound #2 Right,Distal Lower Leg: Change dressing every day. Wound #3 Left,Proximal,Posterior Lower Leg: Change dressing every day. Wound #4 Left,Distal,Posterior Lower Leg: Change dressing every day. Wound #5 Left,Proximal,Lateral Lower Leg: Change dressing every day. Wound #6 Left,Distal,Lateral Lower Leg: Change dressing every day. Follow-up Appointments: Wound #1 Right,Proximal Lower Leg: Return Appointment in 1 week. Wound #2 Right,Distal Lower Leg: Return Appointment in 1 week. Wound #3 Left,Proximal,Posterior Lower Leg: Return Appointment in 1 week. Wound #4 Left,Distal,Posterior Lower Leg: Return Appointment in 1 week. Wound #5 Left,Proximal,Lateral Lower Leg: Return Appointment in 1 week. Wound #6 Left,Distal,Lateral Lower Leg: Return Appointment in 1 week. Edema Control: Wound #1 Right,Proximal Lower Leg: Tubigrip Wound #2 Right,Distal  Lower Leg: Tubigrip Wound #3 Left,Proximal,Posterior Lower Leg: Tubigrip Wound #4 Left,Distal,Posterior Lower Leg: Tubigrip Wound #5 Left,Proximal,Lateral  Lower Leg: Tubigrip Wound #6 Left,Distal,Lateral Lower Leg: Tubigrip Home Health: Wound #1 Right,Proximal Lower Leg: Initiate Home Health for Skilled Nursing Home Health Nurse may visit PRN to address patient s wound care needs. FACE TO FACE ENCOUNTER: MEDICARE and MEDICAID PATIENTS: I certify that this patient is under my care and that I had a face-to-face encounter that meets the physician face-to-face encounter requirements with this patient on this date. The encounter with the patient was in whole or in part for the [facility], Adleigh H. ([id number]) following MEDICAL CONDITION: (primary reason for Home Healthcare) MEDICAL NECESSITY: I certify, that based on my findings, NURSING services are a medically necessary home health service. HOME BOUND STATUS: I certify that my clinical findings support that this patient is homebound (i.e., Due to illness or injury, pt requires aid of supportive devices such as crutches, cane, wheelchairs, walkers, the use of special transportation or the assistance of another person to leave their place of residence. There is a normal inability to leave the home and doing so requires considerable and taxing effort. Other absences are for medical reasons / religious services and are infrequent or of short duration when for other reasons). If current dressing causes regression in wound condition, may D/C ordered dressing product/s and apply Normal Saline Moist Dressing daily until next Wound Healing Center / Other MD appointment. Notify Wound Healing Center of regression in wound condition at [phone]. Please direct any NON-WOUND related issues/requests for orders to patient's Primary Care Physician Wound #2 Right,Distal Lower Leg: Initiate Home Health for Skilled Nursing Home Health Nurse may visit PRN  to address patient s wound care needs. FACE TO FACE ENCOUNTER: MEDICARE and MEDICAID PATIENTS: I certify that this patient is under my care and that I had a face-to-face encounter that meets the physician face-to-face encounter requirements with this patient on this date. The encounter with the patient was in whole or in part for the following MEDICAL CONDITION: (primary reason for Home Healthcare) MEDICAL NECESSITY: I certify, that based on my findings, NURSING services are a medically necessary home health service. HOME BOUND STATUS: I certify that my clinical findings support that this patient is homebound (i.e., Due to illness or injury, pt requires aid of supportive devices such as crutches, cane, wheelchairs, walkers, the use of special transportation or the assistance of another person to leave their place of residence. There is a normal inability to leave the home and doing so requires considerable and taxing effort. Other absences are for medical reasons / religious services and are infrequent or of short duration when for other reasons). If current dressing causes regression in wound condition, may D/C ordered dressing product/s and apply Normal Saline Moist Dressing daily until next Wound Healing Center / Other MD appointment. Notify Wound Healing Center of regression in wound condition at [phone]. Please direct any NON-WOUND related issues/requests for orders to patient's Primary Care Physician Wound #3 Left,Proximal,Posterior Lower Leg: Initiate Home Health for Skilled Nursing Home Health Nurse may visit PRN to address patient s wound care needs. FACE TO FACE ENCOUNTER: MEDICARE and MEDICAID PATIENTS: I certify that this patient is under my care and that I had a face-to-face encounter that meets the physician face-to-face encounter requirements with this patient on this date. The encounter with the patient was in whole or in part for the following MEDICAL CONDITION: (primary  reason for Home Healthcare) MEDICAL NECESSITY: I certify, that based on my findings, NURSING services are a medically necessary home health service. HOME BOUND STATUS:  I certify that my clinical findings support that this patient is homebound (i.e., Due to illness or injury, pt requires aid of supportive devices such as crutches, cane, wheelchairs, walkers, the use of special transportation or the assistance of another person to leave their place of residence. There is a normal inability to leave the home and doing so requires considerable and taxing effort. Other absences are for medical reasons / religious services and are infrequent or of short duration when for other reasons). If current dressing causes regression in wound condition, may D/C ordered dressing product/s and apply Normal Saline Moist Dressing daily until next Wound Healing Center / Other MD appointment. Notify Wound Healing Center of regression in wound condition at [phone]. Please direct any NON-WOUND related issues/requests for orders to patient's Primary Care Physician Wound #4 Left,Distal,Posterior Lower Leg: Initiate Home Health for Skilled Nursing Home Health Nurse may visit PRN to address patient s wound care needs. FACE TO FACE ENCOUNTER: MEDICARE and MEDICAID PATIENTS: I certify that this patient is under my care and that I had a face-to-face encounter that meets the physician face-to-face encounter requirements with this patient on this date. The encounter with the patient was in whole or in part for the [facility], Zela H. ([id number]) following MEDICAL CONDITION: (primary reason for Home Healthcare) MEDICAL NECESSITY: I certify, that based on my findings, NURSING services are a medically necessary home health service. HOME BOUND STATUS: I certify that my clinical findings support that this patient is homebound (i.e., Due to illness or injury, pt requires aid of supportive devices such as crutches, cane,  wheelchairs, walkers, the use of special transportation or the assistance of another person to leave their place of residence. There is a normal inability to leave the home and doing so requires considerable and taxing effort. Other absences are for medical reasons / religious services and are infrequent or of short duration when for other reasons). If current dressing causes regression in wound condition, may D/C ordered dressing product/s and apply Normal Saline Moist Dressing daily until next Wound Healing Center / Other MD appointment. Notify Wound Healing Center of regression in wound condition at [phone]. Please direct any NON-WOUND related issues/requests for orders to patient's Primary Care Physician Wound #5 Left,Proximal,Lateral Lower Leg: Initiate Home Health for Skilled Nursing Home Health Nurse may visit PRN to address patient s wound care needs. FACE TO FACE ENCOUNTER: MEDICARE and MEDICAID PATIENTS: I certify that this patient is under my care and that I had a face-to-face encounter that meets the physician face-to-face encounter requirements with this patient on this date. The encounter with the patient was in whole or in part for the following MEDICAL CONDITION: (primary reason for Home Healthcare) MEDICAL NECESSITY: I certify, that based on my findings, NURSING services are a medically necessary home health service. HOME BOUND STATUS: I certify that my clinical findings support that this patient is homebound (i.e., Due to illness or injury, pt requires aid of supportive devices such as crutches, cane, wheelchairs, walkers, the use of special transportation or the assistance of another person to leave their place of residence. There is a normal inability to leave the home and doing so requires considerable and taxing effort. Other absences are for medical reasons / religious services and are infrequent or of short duration when for other reasons). If current dressing causes  regression in wound condition, may D/C ordered dressing product/s and apply Normal Saline Moist Dressing daily until next Wound Healing Center / Other MD appointment. Notify  Wound Healing Center of regression in wound condition at [phone]. Please direct any NON-WOUND related issues/requests for orders to patient's Primary Care Physician Wound #6 Left,Distal,Lateral Lower Leg: Initiate Home Health for Skilled Nursing Home Health Nurse may visit PRN to address patient s wound care needs. FACE TO FACE ENCOUNTER: MEDICARE and MEDICAID PATIENTS: I certify that this patient is under my care and that I had a face-to-face encounter that meets the physician face-to-face encounter requirements with this patient on this date. The encounter with the patient was in whole or in part for the following MEDICAL CONDITION: (primary reason for Home Healthcare) MEDICAL NECESSITY: I certify, that based on my findings, NURSING services are a medically necessary home health service. HOME BOUND STATUS: I certify that my clinical findings support that this patient is homebound (i.e., Due to illness or injury, pt requires aid of supportive devices such as crutches, cane, wheelchairs, walkers, the use of special transportation or the assistance of another person to leave their place of residence. There is a normal inability to leave the home and doing so requires considerable and taxing effort. Other absences are for medical reasons / religious services and are infrequent or of short duration when for other reasons). If current dressing causes regression in wound condition, may D/C ordered dressing product/s and apply Normal Saline Moist Dressing daily until next Wound Healing Center / Other MD appointment. Notify Wound Healing Center of regression in wound condition at [phone]. Please direct any NON-WOUND related issues/requests for orders to patient's Primary Care Physician Services and Therapies ordered  were: Arterial Studies- Bilateral, Venous Studies -Bilateral [name], [name] ([id number]) This 59 year old patient whose had a recent bereavement when her husband died 3 weeks ago has now developed significant ulcerated areas the left lower extremity more than right lower extremity. She has chronic renal failure and is having dialysis started shortly. The ulcerated areas are rather dry and painful and I have recommended hydrogel with silver applications and a light dressing over this. I have recommended both arterial and venous studies to be done as soon as possible and I will talk to her PCP regarding her management medically and discuss her seeing the nephrologist as soon as possible. She will see me back next week. Electronic Signature(s) Signed: 01/31/2015 4:08:03 PM By: Evlyn Kanner MD, FACS Previous Signature: 01/30/2015 12:02:48 PM Version By: Evlyn Kanner MD, FACS Entered By: Evlyn Kanner on 01/31/2015 16:08:03 [name], [name] ([id number]) -------------------------------------------------------------------------------- ROS/PFSH Details Patient Name: LASHEENA, FRIEZE 01/30/2015 10:15 Date of Service: AM Medical Record [id number] Number: Patient Account Number: [id number] [birth year] (58 y.o. Treating RN: Curtis Sites Date of Birth/Sex: Female) Other Clinician: Primary Care Physician: Jonna Clark, Fayrene Fearing Treating Rayce Brahmbhatt Referring Physician: Fidel Levy Physician/Extender: Tania Ade in Treatment: 0 Information Obtained From Patient Wound History Do you currently have one or more open woundso Yes How many open wounds do you currently haveo 6 Approximately how long have you had your woundso 3 weeks How have you been treating your wound(s) until nowo iodine and kerlix Has your wound(s) ever healed and then re-openedo No Have you had any lab work done in the past montho No Have you tested positive for an antibiotic resistant organism (MRSA, VRE)o No Have you  tested positive for osteomyelitis (bone infection)o No Have you had any tests for circulation on your legso No Have you had other problems associated with your woundso Swelling Cardiovascular Complaints and Symptoms: Positive for: LE edema Medical History: Positive for: Hypertension Genitourinary Complaints and Symptoms:  Positive for: Kidney failure/ Dialysis - CKD Integumentary (Skin) Complaints and Symptoms: Positive for: Wounds Constitutional Symptoms (General Health) Complaints and Symptoms: No Complaints or Symptoms Eyes Yarberry, Noretta H. ([id number]) Complaints and Symptoms: No Complaints or Symptoms Ear/Nose/Mouth/Throat Complaints and Symptoms: No Complaints or Symptoms Hematologic/Lymphatic Complaints and Symptoms: No Complaints or Symptoms Respiratory Complaints and Symptoms: No Complaints or Symptoms Gastrointestinal Complaints and Symptoms: No Complaints or Symptoms Endocrine Medical History: Positive for: Type II Diabetes Time with diabetes: 3 years Treated with: Insulin Blood sugar tested every day: Yes Tested : BID Immunological Complaints and Symptoms: No Complaints or Symptoms Musculoskeletal Complaints and Symptoms: No Complaints or Symptoms Neurologic Complaints and Symptoms: No Complaints or Symptoms Oncologic Complaints and Symptoms: No Complaints or Symptoms Medical History: RILEA, ARUTYUNYAN. ([id number]) Negative for: Received Chemotherapy; Received Radiation Psychiatric Complaints and Symptoms: No Complaints or Symptoms Family and Social History Cancer: No; Diabetes: No; Heart Disease: No; Hereditary Spherocytosis: No; Hypertension: No; Kidney Disease: No; Lung Disease: No; Seizures: No; Stroke: No; Thyroid Problems: No; Tuberculosis: No; Never smoker; Marital Status - Widowed; Alcohol Use: Never; Drug Use: No History; Caffeine Use: Never; Financial Concerns: No; Food, Clothing or Shelter Needs: No; Support System Lacking: No;  Transportation Concerns: No; Advanced Directives: No; Patient does not want information on Advanced Directives Physician Affirmation I have reviewed and agree with the above information. Electronic Signature(s) Signed: 01/30/2015 10:59:34 AM By: Evlyn Kanner MD, FACS Signed: 01/30/2015 4:45:08 PM By: Curtis Sites Entered By: Evlyn Kanner on 01/30/2015 10:59:33 Keisling, [name] ([id number]) -------------------------------------------------------------------------------- SuperBill Details Patient Name: Dolberry, Kathlee H. Date of Service: 01/30/2015 Medical Record Number: [id number] Patient Account Number: [id number] Date of Birth/Sex: March 07, 1956 (59 y.o. 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 Facility Procedures CPT4 Code: 13244010 Description: 27253 - WOUND CARE VISIT-LEV 5 EST PT Modifier: Quantity: 1 Physician Procedures CPT4 Code: 6644034 Description: 99204 - WC PHYS LEVEL 4 - NEW PT ICD-10 Description Diagnosis E11.622 Type 2 diabetes mellitus with other skin ulcer N18.4 Chronic kidney disease, stage 4 (severe) L97.222 Non-pressure chronic ulcer of left calf with fat L97.212 Non-pressure  chronic ulcer of right calf with fat Modifier: layer exposed layer exposed Quantity: 1 Electronic Signature(s) Signed: 01/30/2015 12:03:08 PM By: Evlyn Kanner MD, FACS Entered By: Evlyn Kanner on 01/30/2015 12:03:07

## 2015-01-31 NOTE — Progress Notes (Signed)
KIHANNA, KAMIYA ([id number]) Visit Report for 01/30/2015 Allergy List Details Patient Name: [name], [name]. Date of Service: 01/30/2015 10:15 AM Medical Record Number: [id number] Patient Account Number: [id number] Date of Birth/Sex: 03/14/1956 (58 y.o. Female) Treating RN: Curtis Sites Primary Care Physician: Fidel Levy Other Clinician: Referring Physician: Fidel Levy Treating Physician/Extender: Rudene Re in Treatment: 0 Allergies Active Allergies Vicodin Allergy Notes Electronic Signature(s) Signed: 01/30/2015 4:45:08 PM By: Curtis Sites Entered By: Curtis Sites on 01/30/2015 10:24:10 Holub, [name] ([id number]) -------------------------------------------------------------------------------- Arrival Information Details Patient Name: [name], [name] Date of Service: 01/30/2015 10:15 AM Medical Record Number: [id number] Patient Account Number: [id number] Date of Birth/Sex: 1956-06-05 (58 y.o. Female) Treating RN: Curtis Sites Primary Care Physician: Fidel Levy Other Clinician: Referring Physician: Fidel Levy Treating Physician/Extender: Rudene Re in Treatment: 0 Visit Information Patient Arrived: Ambulatory Arrival Time: 10:19 Accompanied By: self Transfer Assistance: None Patient Identification Verified: Yes Secondary Verification Process Yes Completed: Patient Has Alerts: Yes Patient Alerts: DMII ABI [place] Bilateral Electronic Signature(s) Signed: 01/30/2015 4:45:08 PM By: Curtis Sites Entered By: Curtis Sites on 01/30/2015 11:19:15 [name], [name] ([id number]) -------------------------------------------------------------------------------- Clinic Level of Care Assessment Details Patient Name: [name], [name] Date of Service: 01/30/2015 10:15 AM Medical Record Number: [id number] Patient Account Number: [id number] Date of Birth/Sex: 25-Jun-1956 (58 y.o. Female) Treating RN: Curtis Sites Primary Care Physician: Fidel Levy Other Clinician: Referring Physician: Fidel Levy Treating Physician/Extender: Rudene Re in Treatment: 0 Clinic Level of Care Assessment Items TOOL 2 Quantity Score []  - Use when only an EandM is performed on the INITIAL visit 0 ASSESSMENTS - Nursing Assessment / Reassessment X - General Physical Exam (combine w/ comprehensive assessment (listed just 1 20 below) when performed on new pt. evals) X - Comprehensive Assessment (HX, ROS, Risk Assessments, Wounds Hx, etc.) 1 25 ASSESSMENTS - Wound and Skin Assessment / Reassessment []  - Simple Wound Assessment / Reassessment - one wound 0 X - Complex Wound Assessment / Reassessment - multiple wounds 6 5 []  - Dermatologic / Skin Assessment (not related to wound area) 0 ASSESSMENTS - Ostomy and/or Continence Assessment and Care []  - Incontinence Assessment and Management 0 []  - Ostomy Care Assessment and Management (repouching, etc.) 0 PROCESS - Coordination of Care X - Simple Patient / Family Education for ongoing care 1 15 []  - Complex (extensive) Patient / Family Education for ongoing care 0 X - Staff obtains Chiropractor, Records, Test Results / Process Orders 1 10 []  - Staff telephones HHA, Nursing Homes / Clarify orders / etc 0 []  - Routine Transfer to another Facility (non-emergent condition) 0 []  - Routine Hospital Admission (non-emergent condition) 0 X - New Admissions / Manufacturing engineer / Ordering NPWT, Apligraf, etc. 1 15 []  - Emergency Hospital Admission (emergent condition) 0 X - Simple Discharge Coordination 1 10 Getty, Neeya H. ([id number]) []  - Complex (extensive) Discharge Coordination 0 PROCESS - Special Needs []  - Pediatric / Minor Patient Management 0 []  - Isolation Patient Management 0 []  - Hearing / Language / Visual special needs 0 []  - Assessment of Community assistance (transportation, D/C planning, etc.) 0 []  - Additional assistance /  Altered mentation 0 []  - Support Surface(s) Assessment (bed, cushion, seat, etc.) 0 INTERVENTIONS - Wound Cleansing / Measurement X - Wound Imaging (photographs - any number of wounds) 1 5 []  - Wound Tracing (instead of photographs) 0 []  - Simple Wound Measurement - one wound 0 X - Complex Wound Measurement - multiple wounds  6 5 []  - Simple Wound Cleansing - one wound 0 X - Complex Wound Cleansing - multiple wounds 6 5 INTERVENTIONS - Wound Dressings X - Small Wound Dressing one or multiple wounds 6 10 []  - Medium Wound Dressing one or multiple wounds 0 []  - Large Wound Dressing one or multiple wounds 0 []  - Application of Medications - injection 0 INTERVENTIONS - Miscellaneous []  - External ear exam 0 []  - Specimen Collection (cultures, biopsies, blood, body fluids, etc.) 0 []  - Specimen(s) / Culture(s) sent or taken to Lab for analysis 0 []  - Patient Transfer (multiple staff / Michiel Sites Lift / Similar devices) 0 []  - Simple Staple / Suture removal (25 or less) 0 []  - Complex Staple / Suture removal (26 or more) 0 [name], [name] ([id number]) []  - Hypo / Hyperglycemic Management (close monitor of Blood Glucose) 0 []  - Ankle / Brachial Index (ABI) - do not check if billed separately 0 Has the patient been seen at the hospital within the last three years: Yes Total Score: 250 Level Of Care: New/Established - Level 5 Electronic Signature(s) Signed: 01/30/2015 4:45:08 PM By: Curtis Sites Entered By: Curtis Sites on 01/30/2015 11:25:05 League, [name] ([id number]) -------------------------------------------------------------------------------- Encounter Discharge Information Details Patient Name: [name], [name] Date of Service: 01/30/2015 10:15 AM Medical Record Number: [id number] Patient Account Number: [id number] Date of Birth/Sex: 13-Apr-1956 (58 y.o. Female) Treating RN: Curtis Sites Primary Care Physician: Fidel Levy Other Clinician: Referring Physician:  Fidel Levy Treating Physician/Extender: Rudene Re in Treatment: 0 Encounter Discharge Information Items Discharge Pain Level: 0 Discharge Condition: Stable Ambulatory Status: Ambulatory Discharge Destination: Home Private Transportation: Auto Accompanied By: friend Schedule Follow-up Appointment: Yes Medication Reconciliation completed and No provided to Patient/Care Gabriell Casimir: Clinical Summary of Care: Electronic Signature(s) Signed: 01/30/2015 4:45:08 PM By: Curtis Sites Entered By: Curtis Sites on 01/30/2015 11:43:22 Gucciardo, [name] ([id number]) -------------------------------------------------------------------------------- Lower Extremity Assessment Details Patient Name: [name], [name] Date of Service: 01/30/2015 10:15 AM Medical Record Number: [id number] Patient Account Number: [id number] Date of Birth/Sex: [birth year] (58 y.o. Female) Treating RN: Curtis Sites Primary Care Physician: Fidel Levy Other Clinician: Referring Physician: Fidel Levy Treating Physician/Extender: Rudene Re in Treatment: 0 Edema Assessment Assessed: [Left: No] [Right: No] Edema: [Left: Yes] [Right: Yes] Calf Left: Right: Point of Measurement: 34 cm From Medial Instep 48.5 cm 42.5 cm Ankle Left: Right: Point of Measurement: 10 cm From Medial Instep 26 cm 25.4 cm Vascular Assessment Pulses: Posterior Tibial Palpable: [Left:No] [Right:No] Dorsalis Pedis Palpable: [Left:Yes] [Right:Yes] Extremity colors, hair growth, and conditions: Extremity Color: [Left:Mottled] [Right:Hyperpigmented] Hair Growth on Extremity: [Left:No] [Right:No] Temperature of Extremity: [Left:Warm] [Right:Warm] Capillary Refill: [Left:< 3 seconds] [Right:< 3 seconds] Blood Pressure: Brachial: [Right:124] Toe Nail Assessment Left: Right: Thick: No No Discolored: No No Deformed: No No Improper Length and Hygiene: No No Notes ABI [place] bilateral Electronic  Signature(s) KIYLAH, LOYER ([id number]) Signed: 01/30/2015 4:45:08 PM By: Curtis Sites Entered By: Curtis Sites on 01/30/2015 11:08:24 [name], [name] ([id number]) -------------------------------------------------------------------------------- Multi Wound Chart Details Patient Name: [name], [name] Date of Service: 01/30/2015 10:15 AM Medical Record Number: [id number] Patient Account Number: [id number] Date of Birth/Sex: 1956-07-12 (58 y.o. Female) Treating RN: Curtis Sites Primary Care Physician: Fidel Levy Other Clinician: Referring Physician: Fidel Levy Treating Physician/Extender: Rudene Re in Treatment: 0 Vital Signs Height(in): 68 Pulse(bpm): 68 Weight(lbs): 294 Blood Pressure 118/56 (mmHg): Body Mass Index(BMI): 45 Temperature(F): 97.8 Respiratory Rate 18 (breaths/min): Photos: [1:No Photos] [2:No Photos] [  3:No Photos] Wound Location: [1:Right, Proximal Lower Leg Right, Distal Lower Leg] [3:Left, Proximal, Posterior Lower Leg] Wounding Event: [1:Gradually Appeared] [2:Gradually Appeared] [3:Gradually Appeared] Primary Etiology: [1:Venous Leg Ulcer] [2:Venous Leg Ulcer] [3:Venous Leg Ulcer] Date Acquired: [1:01/09/2015] [2:01/09/2015] [3:01/09/2015] Weeks of Treatment: [1:0] [2:0] [3:0] Wound Status: [1:Open] [2:Open] [3:Open] Measurements L x W x D 0.3x0.2x0.1 [2:0.4x0.3x0.1] [3:4x3.5x0.1] (cm) Area (cm) : [1:0.047] [2:0.094] [3:10.996] Volume (cm) : [1:0.005] [2:0.009] [3:1.1] Periwound Skin Texture: No Abnormalities Noted [2:No Abnormalities Noted] [3:No Abnormalities Noted] Periwound Skin [1:No Abnormalities Noted] [2:No Abnormalities Noted] [3:No Abnormalities Noted] Moisture: Periwound Skin Color: No Abnormalities Noted [2:No Abnormalities Noted] [3:No Abnormalities Noted] Tenderness on [1:No] [2:No] [3:No] Wound Number: 4 5 6  Photos: No Photos No Photos No Photos Wound Location: Left, Distal, Posterior Left,  Proximal, Lateral Left, Distal, Lateral Lower Lower Leg Lower Leg Leg Wounding Event: Gradually Appeared Gradually Appeared Gradually Appeared 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 ([id number]) Area (cm) : 16.855 10.744 14.326 Volume (cm) : 1.685 1.074 1.433 Periwound Skin Texture: No Abnormalities Noted No Abnormalities Noted No Abnormalities Noted Periwound Skin No Abnormalities Noted No Abnormalities Noted No Abnormalities Noted Moisture: Periwound Skin Color: No Abnormalities Noted No Abnormalities Noted No Abnormalities Noted Tenderness on No No No Palpation: Treatment Notes Electronic Signature(s) Signed: 01/30/2015 4:45:08 PM By: Curtis Sitesorthy, Joanna Entered By: Curtis Sitesorthy, Joanna on 01/30/2015 11:22:27 Chance, [name] EdelmanPAMELA H. ([id number]) -------------------------------------------------------------------------------- Multi-Disciplinary Care Plan Details Patient Name: Lafalce, Keimya H. Date of Service: 01/30/2015 10:15 AM Medical Record Number: [id number] Patient Account Number: [id number] Date of Birth/Sex: 09-23-1955 (58 y.o. Female) Treating RN: Curtis Sitesorthy, Joanna Primary Care Physician: Fidel LevyHAWKINS JR, JAMES Other Clinician: Referring Physician: Fidel LevyHAWKINS JR, JAMES Treating Physician/Extender: Rudene ReBritto, Errol Weeks in Treatment: 0 Active Inactive Orientation to the Wound Care Program Nursing Diagnoses: Knowledge deficit related to the wound healing center program Goals: Patient/caregiver will verbalize understanding of the Wound Healing Center Program Date Initiated: 01/30/2015 Goal Status: Active Interventions: Provide education on orientation to the wound center Notes: Pain, Acute or Chronic Nursing Diagnoses: Pain, acute or chronic: actual or potential Goals: Patient  will verbalize adequate pain control and receive pain control interventions during procedures as needed Date Initiated: 01/30/2015 Goal Status: Active Patient/caregiver will verbalize adequate pain control between visits Date Initiated: 01/30/2015 Goal Status: Active Interventions: Assess comfort goal upon admission Encourage patient to take pain medications as prescribed Notes: Venous Leg Ulcer Nursing Diagnoses: Gervin, Earla H. ([id number]) Potential for venous Insuffiency (use before diagnosis confirmed) Goals: Non-invasive venous studies are completed as ordered Date Initiated: 01/30/2015 Goal Status: Active Interventions: Assess peripheral edema status every visit. Notes: Wound/Skin Impairment Nursing Diagnoses: Impaired tissue integrity Goals: Ulcer/skin breakdown will have a volume reduction of 30% by week 4 Date Initiated: 01/30/2015 Goal Status: Active Ulcer/skin breakdown will have a volume reduction of 50% by week 8 Date Initiated: 01/30/2015 Goal Status: Active Ulcer/skin breakdown will have a volume reduction of 80% by week 12 Date Initiated: 01/30/2015 Goal Status: Active Ulcer/skin breakdown will heal within 14 weeks Date Initiated: 01/30/2015 Goal Status: Active Interventions: Assess ulceration(s) every visit Notes: Electronic Signature(s) Signed: 01/30/2015 4:45:08 PM By: Curtis Sitesorthy, Joanna Entered By: Curtis Sitesorthy, Joanna on 01/30/2015 11:22:14 [name], [name] EdelmanPAMELA H. ([id number]) -------------------------------------------------------------------------------- Patient/Caregiver Education Details Patient Name: [name], [name] Date of Service: 01/30/2015 10:15 AM Medical Record Number: [id number] Patient Account Number: [id number]  Date of Birth/Gender: 16-Jul-1955 (59 y.o. Female) Treating RN: Curtis Sites Primary Care Physician: Fidel Levy Other Clinician: Referring Physician: Fidel Levy Treating Physician/Extender: Rudene Re in  Treatment: 0 Education Assessment Education Provided To: Patient and Caregiver Education Topics Provided Wound/Skin Impairment: Handouts: Other: wound care as ordered Methods: Demonstration, Explain/Verbal Responses: State content correctly Electronic Signature(s) Signed: 01/30/2015 4:45:08 PM By: Curtis Sites Entered By: Curtis Sites on 01/30/2015 11:43:38 [name], [name] ([id number]) -------------------------------------------------------------------------------- Wound Assessment Details Patient Name: [name], Aamirah H. Date of Service: 01/30/2015 10:15 AM Medical Record Number: [id number] Patient Account Number: [id number] Date of Birth/Sex: 24-May-1956 (58 y.o. Female) Treating RN: Curtis Sites Primary Care Physician: Fidel Levy Other Clinician: Referring Physician: Fidel Levy Treating Physician/Extender: Rudene Re in Treatment: 0 Wound Status Wound Number: 1 Primary Etiology: Venous Leg Ulcer Wound Location: Right Lower Leg - Proximal Wound Status: Open Wounding Event: Gradually Appeared Comorbid History: Hypertension, Type II Diabetes Date Acquired: 01/09/2015 Weeks Of Treatment: 0 Clustered Wound: No Photos Photo Uploaded By: Elliot Gurney, RN, BSN, Kim on 01/30/2015 16:54:07 Wound Measurements Length: (cm) 0.3 Width: (cm) 0.2 Depth: (cm) 0.1 Area: (cm) 0.047 Volume: (cm) 0.005 % Reduction in Area: 0% % Reduction in Volume: 0% Epithelialization: None Tunneling: No Undermining: No Wound Description Classification: Partial Thickness Foul Odor A Diabetic Severity (Wagner): Grade 1 Wound Margin: Flat and Intact Exudate Amount: Small Exudate Type: Serous Exudate Color: amber fter Cleansing: No Wound Bed Granulation Amount: None Present (0%) Exposed Structure Necrotic Amount: Large (67-100%) Fascia Exposed: No Necrotic Quality: Eschar Fat Layer Exposed: No Jaffe, Jacy H. ([id number]) Tendon Exposed: No Muscle Exposed:  No Joint Exposed: No Bone Exposed: No Limited to Skin Breakdown Periwound Skin Texture Texture Color No Abnormalities Noted: No No Abnormalities Noted: No Callus: No Atrophie Blanche: No Crepitus: No Cyanosis: No Excoriation: No Ecchymosis: No Fluctuance: No Erythema: No Friable: No Hemosiderin Staining: No Induration: No Mottled: No Localized Edema: No Pallor: No Rash: No Rubor: No Scarring: No Temperature / Pain Moisture Temperature: No Abnormality No Abnormalities Noted: No Tenderness on Palpation: Yes Dry / Scaly: No Maceration: No Moist: No Wound Preparation Ulcer Cleansing: Rinsed/Irrigated with Saline Topical Anesthetic Applied: Other: lidocaine 4%, Treatment Notes Wound #1 (Right, Proximal Lower Leg) 1. Cleansed with: Clean wound with Normal Saline 2. Anesthetic Topical Lidocaine 4% cream to wound bed prior to debridement 4. Dressing Applied: Hydrogel 5. Secondary Dressing Applied Guaze, ABD and kerlix/Conform 7. Secured with Tape Notes stretch netting Electronic Signature(s) Signed: 01/30/2015 1:38:58 PM By: Curtis Sites Entered By: Curtis Sites on 01/30/2015 13:38:58 Retzloff, [name] ([id number]) Lavery, Kyana Rexene [name] ([id number]) -------------------------------------------------------------------------------- Wound Assessment Details Patient Name: Mainville, Mayme H. Date of Service: 01/30/2015 10:15 AM Medical Record Number: [id number] Patient Account Number: [id number] Date of Birth/Sex: Feb 01, 1956 (58 y.o. Female) Treating RN: Curtis Sites Primary Care Physician: Fidel Levy Other Clinician: Referring Physician: Fidel Levy Treating Physician/Extender: Rudene Re in Treatment: 0 Wound Status Wound Number: 2 Primary Etiology: Venous Leg Ulcer Wound Location: Right Lower Leg - Distal Wound Status: Open Wounding Event: Gradually Appeared Comorbid History: Hypertension, Type II Diabetes Date Acquired:  01/09/2015 Weeks Of Treatment: 0 Clustered Wound: No Photos Photo Uploaded By: Elliot Gurney, RN, BSN, Kim on 01/30/2015 16:54:44 Wound Measurements Length: (cm) 0.4 Width: (cm) 0.3 Depth: (cm) 0.1 Area: (cm) 0.094 Volume: (cm) 0.009 % Reduction in Area: 0% % Reduction in Volume: 0% Epithelialization: None Tunneling: No Undermining: No Wound Description Classification: Partial Thickness Foul Odor A Diabetic Severity [address]):  Grade 1 Wound Margin: Flat and Intact Exudate Amount: Small Exudate Type: Serous Exudate Color: amber fter Cleansing: No Wound Bed Granulation Amount: None Present (0%) Exposed Structure Necrotic Amount: Large (67-100%) Fascia Exposed: No Necrotic Quality: Eschar Fat Layer Exposed: No Jakes, Eara H. ([id number]) Tendon Exposed: No Muscle Exposed: No Joint Exposed: No Bone Exposed: No Limited to Skin Breakdown Periwound Skin Texture Texture Color No Abnormalities Noted: No No Abnormalities Noted: No Callus: No Atrophie Blanche: No Crepitus: No Cyanosis: No Excoriation: No Ecchymosis: No Fluctuance: No Erythema: No Friable: No Hemosiderin Staining: No Induration: No Mottled: No Localized Edema: No Pallor: No Rash: No Rubor: No Scarring: No Temperature / Pain Moisture Temperature: No Abnormality No Abnormalities Noted: No Tenderness on Palpation: Yes Dry / Scaly: No Maceration: No Moist: No Wound Preparation Ulcer Cleansing: Rinsed/Irrigated with Saline Topical Anesthetic Applied: Other: lidocaine 4%, Treatment Notes Wound #2 (Right, Distal Lower Leg) 1. Cleansed with: Clean wound with Normal Saline 2. Anesthetic Topical Lidocaine 4% cream to wound bed prior to debridement 4. Dressing Applied: Hydrogel 5. Secondary Dressing Applied Guaze, ABD and kerlix/Conform 7. Secured with Tape Notes stretch netting Electronic Signature(s) Signed: 01/30/2015 1:39:45 PM By: Curtis Sites Entered By: Curtis Sites on 01/30/2015  13:39:45 Mclaine, [name] ([id number]) Cliff, Daphnie Rexene [name] ([id number]) -------------------------------------------------------------------------------- Wound Assessment Details Patient Name: Risko, Kyrielle H. Date of Service: 01/30/2015 10:15 AM Medical Record Number: [id number] Patient Account Number: [id number] Date of Birth/Sex: 1955-10-24 (58 y.o. Female) Treating RN: Curtis Sites Primary Care Physician: Fidel Levy Other Clinician: Referring Physician: Fidel Levy Treating Physician/Extender: Rudene Re in Treatment: 0 Wound Status Wound Number: 3 Primary Etiology: Venous Leg Ulcer Wound Location: Left Lower Leg - Posterior, Wound Status: Open Proximal Comorbid History: Hypertension, Type II Diabetes Wounding Event: Gradually Appeared Date Acquired: 01/09/2015 Weeks Of Treatment: 0 Clustered Wound: No Photos Photo Uploaded By: Elliot Gurney, RN, BSN, Kim on 01/30/2015 16:54:44 Wound Measurements Length: (cm) 4 Width: (cm) 3.5 Depth: (cm) 0.1 Area: (cm) 10.996 Volume: (cm) 1.1 % Reduction in Area: 0% % Reduction in Volume: 0% Epithelialization: None Tunneling: No Undermining: No Wound Description Full Thickness Without Classification: Exposed Support Structures Diabetic Severity Grade 2 (Wagner): Wound Margin: Flat and Intact Exudate Amount: Medium Exudate Type: Serous Exudate Color: amber Foul Odor After Cleansing: No Wound Bed Granulation Amount: None Present (0%) Exposed Structure Sculley, Elspeth H. ([id number]) Necrotic Amount: Large (67-100%) Fascia Exposed: No Necrotic Quality: Eschar, Adherent Slough Fat Layer Exposed: No Tendon Exposed: No Muscle Exposed: No Joint Exposed: No Bone Exposed: No Limited to Skin Breakdown Periwound Skin Texture Texture Color No Abnormalities Noted: No No Abnormalities Noted: No Callus: No Atrophie Blanche: No Crepitus: No Cyanosis: No Excoriation: No Ecchymosis: No Fluctuance:  No Erythema: No Friable: No Hemosiderin Staining: No Induration: No Mottled: No Localized Edema: No Pallor: No Rash: No Rubor: No Scarring: No Temperature / Pain Moisture Temperature: No Abnormality No Abnormalities Noted: No Tenderness on Palpation: Yes Dry / Scaly: No Maceration: No Moist: No Wound Preparation Ulcer Cleansing: Rinsed/Irrigated with Saline Topical Anesthetic Applied: Other: lidocaine 4%, Treatment Notes Wound #3 (Left, Proximal, Posterior Lower Leg) 1. Cleansed with: Clean wound with Normal Saline 2. Anesthetic Topical Lidocaine 4% cream to wound bed prior to debridement 4. Dressing Applied: Hydrogel 5. Secondary Dressing Applied Guaze, ABD and kerlix/Conform 7. Secured with Tape Notes stretch netting Electronic Signature(s) JOHNIYA, DURFEE ([id number]) Signed: 01/30/2015 1:40:36 PM By: Curtis Sites Entered By: Curtis Sites on 01/30/2015 13:40:36 Lennartz, Solange H. ([id number]) -------------------------------------------------------------------------------- Wound Assessment  Details Patient Name: CALIYAH, SIEH. Date of Service: 01/30/2015 10:15 AM Medical Record Number: [id number] Patient Account Number: [id number] Date of Birth/Sex: 1956-01-05 (58 y.o. Female) Treating RN: Curtis Sites Primary Care Physician: Fidel Levy Other Clinician: Referring Physician: Fidel Levy Treating Physician/Extender: Rudene Re in Treatment: 0 Wound Status Wound Number: 4 Primary Etiology: Venous Leg Ulcer Wound Location: Left Lower Leg - Posterior, Wound Status: Open Distal Comorbid History: Hypertension, Type II Diabetes Wounding Event: Gradually Appeared Date Acquired: 01/09/2015 Weeks Of Treatment: 0 Clustered Wound: No Photos Photo Uploaded By: Elliot Gurney, RN, BSN, Kim on 01/30/2015 16:55:13 Wound Measurements Length: (cm) 5.8 Width: (cm) 3.7 Depth: (cm) 0.1 Area: (cm) 16.855 Volume: (cm) 1.685 % Reduction in  Area: 0% % Reduction in Volume: 0% Epithelialization: None Tunneling: No Undermining: No Wound Description Full Thickness Without Classification: Exposed Support Structures Diabetic Severity Grade 2 (Wagner): Wound Margin: Flat and Intact Exudate Amount: Medium Exudate Type: Serous Exudate Color: amber Foul Odor After Cleansing: No Wound Bed Granulation Amount: None Present (0%) Exposed Structure Okray, Meili H. ([id number]) Necrotic Amount: Large (67-100%) Fascia Exposed: No Necrotic Quality: Eschar, Adherent Slough Fat Layer Exposed: No Tendon Exposed: No Muscle Exposed: No Joint Exposed: No Bone Exposed: No Limited to Skin Breakdown Periwound Skin Texture Texture Color No Abnormalities Noted: No No Abnormalities Noted: No Callus: No Atrophie Blanche: No Crepitus: No Cyanosis: No Excoriation: No Ecchymosis: No Fluctuance: No Erythema: No Friable: No Hemosiderin Staining: No Induration: No Mottled: No Localized Edema: No Pallor: No Rash: No Rubor: No Scarring: No Temperature / Pain Moisture Temperature: No Abnormality No Abnormalities Noted: No Tenderness on Palpation: Yes Dry / Scaly: No Maceration: No Moist: No Wound Preparation Ulcer Cleansing: Rinsed/Irrigated with Saline Topical Anesthetic Applied: Other: lidocaine 4%, Treatment Notes Wound #4 (Left, Distal, Posterior Lower Leg) 1. Cleansed with: Clean wound with Normal Saline 2. Anesthetic Topical Lidocaine 4% cream to wound bed prior to debridement 4. Dressing Applied: Hydrogel 5. Secondary Dressing Applied Guaze, ABD and kerlix/Conform 7. Secured with Tape Notes stretch netting Electronic Signature(s) WEALTHY, DANIELSKI ([id number]) Signed: 01/30/2015 1:41:11 PM By: Curtis Sites Entered By: Curtis Sites on 01/30/2015 13:41:11 Mccloud, Arlena Rexene [name] ([id number]) -------------------------------------------------------------------------------- Wound Assessment Details Patient  Name: Wild, Tommy H. Date of Service: 01/30/2015 10:15 AM Medical Record Number: [id number] Patient Account Number: [id number] Date of Birth/Sex: [birth year] (58 y.o. Female) Treating RN: Curtis Sites Primary Care Physician: Fidel Levy Other Clinician: Referring Physician: Fidel Levy Treating Physician/Extender: Rudene Re in Treatment: 0 Wound Status Wound Number: 5 Primary Etiology: Venous Leg Ulcer Wound Location: Left Lower Leg - Lateral, Wound Status: Open Proximal Comorbid History: Hypertension, Type II Diabetes Wounding Event: Gradually Appeared Date Acquired: 01/09/2015 Weeks Of Treatment: 0 Clustered Wound: No Photos Photo Uploaded By: Elliot Gurney, RN, BSN, Kim on 01/30/2015 16:55:13 Wound Measurements Length: (cm) 3.6 Width: (cm) 3.8 Depth: (cm) 0.1 Area: (cm) 10.744 Volume: (cm) 1.074 % Reduction in Area: 0% % Reduction in Volume: 0% Epithelialization: None Tunneling: No Undermining: No Wound Description Full Thickness Without Classification: Exposed Support Structures Diabetic Severity Grade 2 (Wagner): Wound Margin: Flat and Intact Exudate Amount: Medium Exudate Type: Serous Exudate Color: amber Foul Odor After Cleansing: No Wound Bed Granulation Amount: None Present (0%) Exposed Structure Panning, Yehudit H. ([id number]) Necrotic Amount: Large (67-100%) Fascia Exposed: No Necrotic Quality: Eschar, Adherent Slough Fat Layer Exposed: No Tendon Exposed: No Muscle Exposed: No Joint Exposed: No Bone Exposed: No Limited to Skin Breakdown Periwound Skin Texture Texture Color No Abnormalities  Noted: No No Abnormalities Noted: No Callus: No Atrophie Blanche: No Crepitus: No Cyanosis: No Excoriation: No Ecchymosis: No Fluctuance: No Erythema: No Friable: No Hemosiderin Staining: No Induration: No Mottled: No Localized Edema: No Pallor: No Rash: No Rubor: No Scarring: No Temperature / Pain Moisture Temperature: No  Abnormality No Abnormalities Noted: No Tenderness on Palpation: Yes Dry / Scaly: No Maceration: No Moist: No Wound Preparation Ulcer Cleansing: Rinsed/Irrigated with Saline Topical Anesthetic Applied: Other: lidocaine 4%, Treatment Notes Wound #5 (Left, Proximal, Lateral Lower Leg) 1. Cleansed with: Clean wound with Normal Saline 2. Anesthetic Topical Lidocaine 4% cream to wound bed prior to debridement 4. Dressing Applied: Hydrogel 5. Secondary Dressing Applied Guaze, ABD and kerlix/Conform 7. Secured with Tape Notes stretch netting Electronic Signature(s) ALBERTO, SCHOCH ([id number]) Signed: 01/30/2015 1:41:51 PM By: Curtis Sites Entered By: Curtis Sites on 01/30/2015 13:41:51 Nofsinger, Bronda Rexene [name] ([id number]) -------------------------------------------------------------------------------- Wound Assessment Details Patient Name: Gordin, Keilin H. Date of Service: 01/30/2015 10:15 AM Medical Record Number: [id number] Patient Account Number: [id number] Date of Birth/Sex: 13-Sep-1955 (58 y.o. Female) Treating RN: Curtis Sites Primary Care Physician: Fidel Levy Other Clinician: Referring Physician: Fidel Levy Treating Physician/Extender: Rudene Re in Treatment: 0 Wound Status Wound Number: 6 Primary Etiology: Venous Leg Ulcer Wound Location: Left Lower Leg - Lateral, Distal Wound Status: Open Wounding Event: Gradually Appeared Comorbid History: Hypertension, Type II Diabetes Date Acquired: 01/09/2015 Weeks Of Treatment: 0 Clustered Wound: No Photos Photo Uploaded By: Elliot Gurney, RN, BSN, Kim on 01/30/2015 16:55:44 Wound Measurements Length: (cm) 5.7 Width: (cm) 3.2 Depth: (cm) 0.1 Area: (cm) 14.326 Volume: (cm) 1.433 % Reduction in Area: 0% % Reduction in Volume: 0% Epithelialization: None Tunneling: No Undermining: No Wound Description Full Thickness Without Classification: Exposed Support Structures Diabetic Severity Grade  2 (Wagner): Wound Margin: Flat and Intact Exudate Amount: Medium Exudate Type: Serous Exudate Color: amber Foul Odor After Cleansing: No Wound Bed Granulation Amount: None Present (0%) Exposed Structure Necrotic Amount: Large (67-100%) Fascia Exposed: No Whitelock, Kynlei H. ([id number]) Necrotic Quality: Eschar, Adherent Slough Fat Layer Exposed: No Tendon Exposed: No Muscle Exposed: No Joint Exposed: No Bone Exposed: No Limited to Skin Breakdown Periwound Skin Texture Texture Color No Abnormalities Noted: No No Abnormalities Noted: No Callus: No Atrophie Blanche: No Crepitus: No Cyanosis: No Excoriation: No Ecchymosis: No Fluctuance: No Erythema: No Friable: No Hemosiderin Staining: No Induration: No Mottled: No Localized Edema: No Pallor: No Rash: No Rubor: No Scarring: No Temperature / Pain Moisture Temperature: No Abnormality No Abnormalities Noted: No Tenderness on Palpation: Yes Dry / Scaly: No Maceration: No Moist: No Wound Preparation Ulcer Cleansing: Rinsed/Irrigated with Saline Topical Anesthetic Applied: Other: lidocaine 4%, Treatment Notes Wound #6 (Left, Distal, Lateral Lower Leg) 1. Cleansed with: Clean wound with Normal Saline 2. Anesthetic Topical Lidocaine 4% cream to wound bed prior to debridement 4. Dressing Applied: Hydrogel 5. Secondary Dressing Applied Guaze, ABD and kerlix/Conform 7. Secured with Tape Notes stretch netting Electronic Signature(s) Signed: 01/30/2015 1:42:53 PM By: Vilma Prader, [name] ([id number]) Entered By: Curtis Sites on 01/30/2015 13:42:53 Carattini, [name] ([id number]) -------------------------------------------------------------------------------- Vitals Details Patient Name: Felber, Haydin H. Date of Service: 01/30/2015 10:15 AM Medical Record Number: [id number] Patient Account Number: [id number] Date of Birth/Sex: Apr 26, 1956 (58 y.o. Female) Treating RN: Curtis Sites Primary  Care Physician: Fidel Levy Other Clinician: Referring Physician: Fidel Levy Treating Physician/Extender: Rudene Re in Treatment: 0 Vital Signs Time Taken: 10:22 Temperature (F): 97.8 Height (in): 68 Pulse (bpm): 68 Source: Stated  Respiratory Rate (breaths/min): 18 Weight (lbs): 294 Blood Pressure (mmHg): 118/56 Source: Stated Reference Range: 80 - 120 mg / dl Body Mass Index (BMI): 44.7 Electronic Signature(s) Signed: 01/30/2015 4:45:08 PM By: Curtis Sites Entered By: Curtis Sites on 01/30/2015 10:23:56

## 2015-02-06 ENCOUNTER — Ambulatory Visit: Payer: BC Managed Care – PPO | Admitting: Surgery

## 2015-02-07 ENCOUNTER — Encounter: Payer: BC Managed Care – PPO | Admitting: Surgery

## 2015-02-07 DIAGNOSIS — L97222 Non-pressure chronic ulcer of left calf with fat layer exposed: Secondary | ICD-10-CM | POA: Diagnosis not present

## 2015-02-07 NOTE — Progress Notes (Addendum)
[name] ([id number]) Visit Report for 02/07/2015 Arrival Information Details Patient Name: [name] [name]. Date of Service: 02/07/2015 9:30 AM Medical Record Number: [id number] Patient Account Number: [id number] Date of Birth/Sex: Oct 28, 1955 (58 y.o. Female) Treating RN: Afful, RN, BSN, [place] Sink Primary Care Physician: Fidel Levy Other Clinician: Referring Physician: Fidel Levy Treating Physician/Extender: Rudene Re in Treatment: 1 Visit Information History Since Last Visit Added or deleted any medications: No Patient Arrived: Ambulatory Any new allergies or adverse reactions: No Arrival Time: 09:35 Had a fall or experienced change in No Accompanied By: self activities of daily living that may affect Transfer Assistance: None risk of falls: Patient Identification Verified: Yes Signs or symptoms of abuse/neglect since last No Secondary Verification Process Yes visito Completed: Hospitalized since last visit: No Patient Has Alerts: Yes Has Dressing in Place as Prescribed: Yes Patient Alerts: DMII Has Compression in Place as Prescribed: No ABI [place] Pain Present Now: No Bilateral Electronic Signature(s) Signed: 02/07/2015 9:37:40 AM By: Elpidio Eric BSN, RN Entered By: Elpidio Eric on 02/07/2015 09:37:39 [name] ([id number]) -------------------------------------------------------------------------------- Clinic Level of Care Assessment Details Patient Name: [name]. Date of Service: 02/07/2015 9:30 AM Medical Record Number: [id number] Patient Account Number: [id number] Date of Birth/Sex: 02/15/1956 (58 y.o. Female) Treating RN: Afful, RN, BSN,  Sink Primary Care Physician: Fidel Levy Other Clinician: Referring Physician: Fidel Levy Treating Physician/Extender: Rudene Re in Treatment: 1 Clinic Level of Care Assessment Items TOOL 4 Quantity Score []  - Use when only an EandM is performed on FOLLOW-UP  visit 0 ASSESSMENTS - Nursing Assessment / Reassessment X - Reassessment of Co-morbidities (includes updates in patient status) 1 10 X - Reassessment of Adherence to Treatment Plan 1 5 ASSESSMENTS - Wound and Skin Assessment / Reassessment []  - Simple Wound Assessment / Reassessment - one wound 0 X - Complex Wound Assessment / Reassessment - multiple wounds 7 5 []  - Dermatologic / Skin Assessment (not related to wound area) 0 ASSESSMENTS - Focused Assessment X - Circumferential Edema Measurements - multi extremities 2 5 []  - Nutritional Assessment / Counseling / Intervention 0 X - Lower Extremity Assessment (monofilament, tuning fork, pulses) 1 5 []  - Peripheral Arterial Disease Assessment (using hand held doppler) 0 ASSESSMENTS - Ostomy and/or Continence Assessment and Care []  - Incontinence Assessment and Management 0 []  - Ostomy Care Assessment and Management (repouching, etc.) 0 PROCESS - Coordination of Care X - Simple Patient / Family Education for ongoing care 1 15 []  - Complex (extensive) Patient / Family Education for ongoing care 0 []  - Staff obtains Chiropractor, Records, Test Results / Process Orders 0 []  - Staff telephones HHA, Nursing Homes / Clarify orders / etc 0 []  - Routine Transfer to another Facility (non-emergent condition) 0 [name] [name]. ([id number]) []  - Routine Hospital Admission (non-emergent condition) 0 []  - New Admissions / Manufacturing engineer / Ordering NPWT, Apligraf, etc. 0 []  - Emergency Hospital Admission (emergent condition) 0 X - Simple Discharge Coordination 1 10 []  - Complex (extensive) Discharge Coordination 0 PROCESS - Special Needs []  - Pediatric / Minor Patient Management 0 []  - Isolation Patient Management 0 []  - Hearing / Language / Visual special needs 0 []  - Assessment of Community assistance (transportation, D/C planning, etc.) 0 []  - Additional assistance / Altered mentation 0 []  - Support Surface(s) Assessment (bed, cushion,  seat, etc.) 0 INTERVENTIONS - Wound Cleansing / Measurement []  - Simple Wound Cleansing - one wound 0 X - Complex Wound Cleansing -  multiple wounds 7 5 X - Wound Imaging (photographs - any number of wounds) 1 5 []  - Wound Tracing (instead of photographs) 0 []  - Simple Wound Measurement - one wound 0 []  - Complex Wound Measurement - multiple wounds 0 INTERVENTIONS - Wound Dressings []  - Small Wound Dressing one or multiple wounds 0 X - Medium Wound Dressing one or multiple wounds 7 15 []  - Large Wound Dressing one or multiple wounds 0 []  - Application of Medications - topical 0 []  - Application of Medications - injection 0 INTERVENTIONS - Miscellaneous []  - External ear exam 0 [name]. ([id number]) []  - Specimen Collection (cultures, biopsies, blood, body fluids, etc.) 0 []  - Specimen(s) / Culture(s) sent or taken to Lab for analysis 0 []  - Patient Transfer (multiple staff / Michiel Sites Lift / Similar devices) 0 []  - Simple Staple / Suture removal (25 or less) 0 []  - Complex Staple / Suture removal (26 or more) 0 []  - Hypo / Hyperglycemic Management (close monitor of Blood Glucose) 0 []  - Ankle / Brachial Index (ABI) - do not check if billed separately 0 X - Vital Signs 1 5 Has the patient been seen at the hospital within the last three years: Yes Total Score: 240 Level Of Care: New/Established - Level 5 Electronic Signature(s) Signed: 02/07/2015 10:19:38 AM By: Elpidio Eric BSN, RN Entered By: Elpidio Eric on 02/07/2015 10:19:38 [name] ([id number]) -------------------------------------------------------------------------------- Encounter Discharge Information Details Patient Name: [name]. Date of Service: 02/07/2015 9:30 AM Medical Record Number: [id number] Patient Account Number: [id number] Date of Birth/Sex: 10/17/1955 (58 y.o. Female) Treating RN: Clover Mealy, RN, BSN, [place] Sink Primary Care Physician: Fidel Levy Other Clinician: Referring Physician:  Fidel Levy Treating Physician/Extender: Rudene Re in Treatment: 1 Encounter Discharge Information Items Discharge Pain Level: 0 Discharge Condition: Stable Ambulatory Status: Ambulatory Discharge Destination: Home Private Transportation: Auto Accompanied By: SELF Schedule Follow-up Appointment: No Medication Reconciliation completed and No provided to Patient/Care Azaria Stegman: Clinical Summary of Care: Electronic Signature(s) Signed: 02/07/2015 10:21:15 AM By: Elpidio Eric BSN, RN Entered By: Elpidio Eric on 02/07/2015 10:21:15 Sickles, Aaron [name] ([id number]) -------------------------------------------------------------------------------- Lower Extremity Assessment Details Patient Name: Mcquary, Latori [name]. Date of Service: 02/07/2015 9:30 AM Medical Record Number: [id number] Patient Account Number: [id number] Date of Birth/Sex: [birth year] (58 y.o. Female) Treating RN: Afful, RN, BSN, [place] Sink Primary Care Physician: Fidel Levy Other Clinician: Referring Physician: Fidel Levy Treating Physician/Extender: Rudene Re in Treatment: 1 Edema Assessment Assessed: [Left: No] [Right: No] E[Left: dema] [Right: :] Calf Left: Right: Point of Measurement: 34 cm From Medial Instep 49.5 cm 43 cm Ankle Left: Right: Point of Measurement: 10 cm From Medial Instep 25.8 cm 25 cm Vascular Assessment Claudication: Claudication Assessment [Left:None] [Right:None] Pulses: Posterior Tibial Dorsalis Pedis Palpable: [Left:Yes] [Right:Yes] Extremity colors, hair growth, and conditions: Extremity Color: [Left:Pale] [Right:Pale] Hair Growth on Extremity: [Left:No] [Right:No] Temperature of Extremity: [Left:Warm] [Right:Warm] Capillary Refill: [Left:< 3 seconds] [Right:< 3 seconds] Toe Nail Assessment Left: Right: Thick: No No Discolored: No No Deformed: No No Improper Length and Hygiene: No No Electronic Signature(s) Signed: 02/07/2015 9:40:09 AM By: Elpidio Eric BSN, RN Entered By: Elpidio Eric on 02/07/2015 09:40:09 Mcleary, Aaron [name] ([id number]) Sellers, [name]. ([id number]) -------------------------------------------------------------------------------- Multi Wound Chart Details Patient Name: [name]. Date of Service: 02/07/2015 9:30 AM Medical Record Number: [id number] Patient Account Number: [id number] Date of Birth/Sex: 1956/06/22 (58 y.o. Female) Treating RN: Afful, RN, BSN, [place] Sink Primary Care Physician: Fidel Levy Other Clinician:  Referring Physician: Fidel Levy Treating Physician/Extender: Rudene Re in Treatment: 1 Vital Signs Height(in): 68 Pulse(bpm): 76 Weight(lbs): 294 Blood Pressure 133/60 (mmHg): Body Mass Index(BMI): 45 Temperature(F): 98.2 Respiratory Rate 18 (breaths/min): Photos: [1:No Photos] [2:No Photos] [3:No Photos] Wound Location: [1:Right Lower Leg - Proximal] [2:Right Lower Leg - Distal] [3:Left Lower Leg - Posterior, Proximal] Wounding Event: [1:Gradually Appeared] [2:Gradually Appeared] [3:Gradually Appeared] Primary Etiology: [1:Venous Leg Ulcer] [2:Venous Leg Ulcer] [3:Venous Leg Ulcer] Comorbid History: [1:Hypertension, Type II Diabetes] [2:Hypertension, Type II Diabetes] [3:Hypertension, Type II Diabetes] Date Acquired: [1:01/09/2015] [2:01/09/2015] [3:01/09/2015] Weeks of Treatment: [1:1] [2:1] [3:1] Wound Status: [1:Open] [2:Open] [3:Open] Measurements L x W x D 0.1x0.1x0.1 [2:0.4x0.5x0.1] [3:3x3x0.1] (cm) Area (cm) : [1:0.008] [2:0.157] [3:7.069] Volume (cm) : [1:0.001] [2:0.016] [3:0.707] % Reduction in Area: [1:83.00%] [2:-67.00%] [3:35.70%] % Reduction in Volume: 80.00% [2:-77.80%] [3:35.70%] Classification: [1:Partial Thickness] [2:Partial Thickness] [3:Full Thickness Without Exposed Support Structures] HBO Classification: [1:Grade 1] [2:Grade 1] [3:Grade 2] Exudate Amount: [1:None Present] [2:None Present] [3:None Present] Wound Margin: [1:Flat and  Intact] [2:Flat and Intact] [3:Flat and Intact] Granulation Amount: [1:None Present (0%)] [2:None Present (0%)] [3:None Present (0%)] Necrotic Amount: [1:Large (67-100%)] [2:Large (67-100%)] [3:Large (67-100%)] Necrotic Tissue: [1:Eschar] [2:Eschar] [3:Eschar, [name]] Exposed Structures: [1:Fascia: No Fat: No Tendon: No Muscle: No Joint: No] [2:Fascia: No Fat: No Tendon: No Muscle: No Joint: No] [3:Fascia: No Fat: No Tendon: No Muscle: No Joint: No] Bone: No Bone: No Bone: No Limited to Skin Limited to Skin Limited to Skin Breakdown Breakdown Breakdown Epithelialization: None None None Periwound Skin Texture: Edema: Yes Edema: Yes Edema: Yes Excoriation: No Excoriation: No Excoriation: No Induration: No Induration: No Induration: No Callus: No Callus: No Callus: No Crepitus: No Crepitus: No Crepitus: No Fluctuance: No Fluctuance: No Fluctuance: No Friable: No Friable: No Friable: No Rash: No Rash: No Rash: No Scarring: No Scarring: No Scarring: No Periwound Skin Dry/Scaly: Yes Dry/Scaly: Yes Dry/Scaly: Yes Moisture: Maceration: No Maceration: No Maceration: No Moist: No Moist: No Moist: No Periwound Skin Color: Atrophie Blanche: No Atrophie Blanche: No Atrophie Blanche: No Cyanosis: No Cyanosis: No Cyanosis: No Ecchymosis: No Ecchymosis: No Ecchymosis: No Erythema: No Erythema: No Erythema: No Hemosiderin Staining: No Hemosiderin Staining: No Hemosiderin Staining: No Mottled: No Mottled: No Mottled: No Pallor: No Pallor: No Pallor: No Rubor: No Rubor: No Rubor: No Temperature: No Abnormality No Abnormality No Abnormality Tenderness on Yes Yes Yes Palpation: Wound Preparation: Ulcer Cleansing: Ulcer Cleansing: Ulcer Cleansing: Rinsed/Irrigated with Rinsed/Irrigated with Rinsed/Irrigated with Saline Saline Saline Topical Anesthetic Topical Anesthetic Topical Anesthetic Applied: Other: lidocaine Applied: Other: lidocaine Applied:  Other: lidocaine 4% 4% 4% Wound Number: [id number]  Photos: No Photos No Photos No Photos Wound Location: Left Lower Leg - Posterior, Left Lower Leg - Lateral, Left Lower Leg - Lateral, Distal Proximal Distal Wounding Event: Gradually Appeared Gradually Appeared Gradually Appeared 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 ([id number]) % Reduction in Area: 6.80% -31.60% 4.10% % Reduction in Volume: 6.80% -31.70% 4.10% Classification: Full Thickness Without Full Thickness Without Full Thickness Without Exposed Support Exposed Support Exposed Support Structures Structures Structures HBO Classification: Grade 2 Grade 2 Grade 2 Exudate Amount: None Present  None Present None Present Wound Margin: Flat and Intact Flat and Intact Flat and Intact Granulation Amount: None Present (0%) None Present (0%) None Present (0%) Necrotic Amount: Large (67-100%) Large (67-100%) Large (67-100%) Necrotic Tissue: Eschar, [name] Eschar, [name] [name] [name] Exposed Structures: Fascia: No Fascia: No Fascia: No Fat: No Fat: No Fat: No Tendon: No Tendon: No Tendon: No Muscle: No Muscle: No Muscle: No Joint: No Joint: No Joint: No Bone: No Bone: No Bone: No Limited to Skin Limited to Skin Limited to Skin Breakdown Breakdown Breakdown Epithelialization: None None None Periwound Skin Texture: Edema: Yes Edema: Yes Edema: Yes Excoriation: No Excoriation: No Excoriation: No Induration: No Induration: No Induration: No Callus: No Callus: No Callus: No Crepitus: No Crepitus: No Crepitus:  No Fluctuance: No Fluctuance: No Fluctuance: No Friable: No Friable: No Friable: No Rash: No Rash: No Rash: No Scarring: No Scarring: No Scarring: No Periwound Skin Dry/Scaly: Yes Dry/Scaly: Yes Dry/Scaly: Yes Moisture: Maceration: No Maceration: No Maceration: No Moist: No Moist: No Moist: No Periwound Skin Color: Atrophie Blanche: No Atrophie Blanche: No Atrophie Blanche: No Cyanosis: No Cyanosis: No Cyanosis: No Ecchymosis: No Ecchymosis: No Ecchymosis: No Erythema: No Erythema: No Erythema: No Hemosiderin Staining: No Hemosiderin Staining: No Hemosiderin Staining: No Mottled: No Mottled: No Mottled: No Pallor: No Pallor: No Pallor: No Rubor: No Rubor: No Rubor: No Temperature: No Abnormality No Abnormality No Abnormality Tenderness on Yes Yes Yes Palpation: Wound Preparation: Ulcer Cleansing: Ulcer Cleansing: Ulcer Cleansing: Rinsed/Irrigated with Rinsed/Irrigated with Rinsed/Irrigated with Saline Saline Saline Topical Anesthetic Topical Anesthetic Topical Anesthetic Applied: Other: lidocaine Applied: Other: lidocaine Applied: Other: lidocaine 4% 4% 4% [name] [name]. ([id number]) Wound Number: 7 N/A N/A Photos: No Photos N/A N/A Wound Location: Left Lower Leg - Medial N/A N/A Wounding Event: Gradually Appeared N/A N/A Primary Etiology: Arterial Insufficiency Ulcer N/A N/A Comorbid History: Hypertension, Type II N/A N/A Diabetes Date Acquired: 01/31/2015 N/A N/A Weeks of Treatment: 0 N/A N/A Wound Status: Open N/A N/A Measurements L x W x D 5x2.5x0.1 N/A N/A (cm) Area (cm) : 9.817 N/A N/A Volume (cm) : 0.982 N/A N/A % Reduction in Area: 0.00% N/A N/A % Reduction in Volume: 0.00% N/A N/A Classification: Unclassifiable N/A N/A HBO Classification: Unable to visualize wound N/A N/A bed Exudate Amount: None Present N/A N/A Wound Margin: Distinct, outline attached N/A N/A Granulation Amount: None Present (0%) N/A N/A Necrotic Amount:  Large (67-100%) N/A N/A Necrotic Tissue: Eschar N/A N/A Exposed Structures: N/A N/A N/A Epithelialization: N/A N/A N/A Periwound Skin Texture: Edema: Yes N/A N/A Excoriation: No Induration: No Callus: No Crepitus: No Fluctuance: No Friable: No Rash: No Scarring: No Periwound Skin Dry/Scaly: Yes N/A N/A Moisture: Maceration: No Moist: No Periwound Skin Color: Atrophie Blanche: No N/A N/A Cyanosis: No Ecchymosis: No Erythema: No Hemosiderin Staining: No Mottled: No Pallor: No Rubor: No Temperature: No Abnormality N/A N/A Tenderness on Yes N/A N/A Palpation: [name] [name]. ([id number]) Wound Preparation: Ulcer Cleansing: N/A N/A Rinsed/Irrigated with Saline Topical Anesthetic Applied: Other: lidocaine 4% Treatment Notes Electronic Signature(s) Signed: 02/07/2015 10:04:34 AM By: Elpidio Eric BSN, RN Entered By: Elpidio Eric on 02/07/2015 10:04:33 Kluth, Aaron [name] ([id number]) -------------------------------------------------------------------------------- Multi-Disciplinary Care Plan Details Patient Name: Infantino, Janye [name]. Date of Service: 02/07/2015 9:30 AM Medical Record Number: [id number] Patient Account Number: [id number] Date of Birth/Sex: 02/24/1956 (58 y.o. Female) Treating RN: Afful, RN, BSN, [place] Sink Primary Care Physician: Fidel Levy Other Clinician: Referring Physician: Fidel Levy Treating Physician/Extender: Rudene Re in  Treatment: 1 Active Inactive Orientation to the Wound Care Program Nursing Diagnoses: Knowledge deficit related to the wound healing center program Goals: Patient/caregiver will verbalize understanding of the Wound Healing Center Program Date Initiated: 01/30/2015 Goal Status: Active Interventions: Provide education on orientation to the wound center Notes: Pain, Acute or Chronic Nursing Diagnoses: Pain, acute or chronic: actual or potential Goals: Patient will verbalize adequate pain control and  receive pain control interventions during procedures as needed Date Initiated: 01/30/2015 Goal Status: Active Patient/caregiver will verbalize adequate pain control between visits Date Initiated: 01/30/2015 Goal Status: Active Interventions: Assess comfort goal upon admission Encourage patient to take pain medications as prescribed Notes: Venous Leg Ulcer Nursing Diagnoses: Holle, Trisa [name]. ([id number]) Potential for venous Insuffiency (use before diagnosis confirmed) Goals: Non-invasive venous studies are completed as ordered Date Initiated: 01/30/2015 Goal Status: Active Interventions: Assess peripheral edema status every visit. Notes: Wound/Skin Impairment Nursing Diagnoses: Impaired tissue integrity Goals: Ulcer/skin breakdown will have a volume reduction of 30% by week 4 Date Initiated: 01/30/2015 Goal Status: Active Ulcer/skin breakdown will have a volume reduction of 50% by week 8 Date Initiated: 01/30/2015 Goal Status: Active Ulcer/skin breakdown will have a volume reduction of 80% by week 12 Date Initiated: 01/30/2015 Goal Status: Active Ulcer/skin breakdown will heal within 14 weeks Date Initiated: 01/30/2015 Goal Status: Active Interventions: Assess ulceration(s) every visit Notes: Electronic Signature(s) Signed: 02/07/2015 10:04:06 AM By: Elpidio Eric BSN, RN Entered By: Elpidio Eric on 02/07/2015 10:04:06 Temples, Nixon [name]. ([id number]) -------------------------------------------------------------------------------- Pain Assessment Details Patient Name: Kardell, Shaundra [name]. Date of Service: 02/07/2015 9:30 AM Medical Record Number: [id number] Patient Account Number: [id number] Date of Birth/Sex: 1956/06/12 (58 y.o. Female) Treating RN: Clover Mealy, RN, BSN, [place] Sink Primary Care Physician: Fidel Levy Other Clinician: Referring Physician: Fidel Levy Treating Physician/Extender: Rudene Re in Treatment: 1 Active Problems Location of Pain Severity  and Description of Pain Patient Has Paino No Site Locations Pain Management and Medication Current Pain Management: Electronic Signature(s) Signed: 02/07/2015 9:37:48 AM By: Elpidio Eric BSN, RN Entered By: Elpidio Eric on 02/07/2015 09:37:47 Kohen, Aaron [name] ([id number]) -------------------------------------------------------------------------------- Patient/Caregiver Education Details Patient Name: Facey, Saarah [name]. Date of Service: 02/07/2015 9:30 AM Medical Record Number: [id number] Patient Account Number: [id number] Date of Birth/Gender: 06-Jul-1956 (59 y.o. Female) Treating RN: Afful, RN, BSN, [place] Sink Primary Care Physician: Fidel Levy Other Clinician: Referring Physician: Fidel Levy Treating Physician/Extender: Rudene Re in Treatment: 1 Education Assessment Education Provided To: Patient Education Topics Provided Welcome To The Wound Care Center: Methods: Explain/Verbal Responses: State content correctly Electronic Signature(s) Signed: 02/07/2015 10:21:25 AM By: Elpidio Eric BSN, RN Entered By: Elpidio Eric on 02/07/2015 10:21:25 Demarest, Aaron [name] ([id number]) -------------------------------------------------------------------------------- Wound Assessment Details Patient Name: Stan, Rebbie [name]. Date of Service: 02/07/2015 9:30 AM Medical Record Number: [id number] Patient Account Number: [id number] Date of Birth/Sex: 1955-09-28 (58 y.o. Female) Treating RN: Afful, RN, BSN, [place] Sink Primary Care Physician: Fidel Levy Other Clinician: Referring Physician: Fidel Levy Treating Physician/Extender: Rudene Re in Treatment: 1 Wound Status Wound Number: 1 Primary Etiology: Venous Leg Ulcer Wound Location: Right Lower Leg - Proximal Wound Status: Open Wounding Event: Gradually Appeared Comorbid History: Hypertension, Type II Diabetes Date Acquired: 01/09/2015 Weeks Of Treatment: 1 Clustered Wound: No Photos Photo Uploaded By:  Elpidio Eric on 02/07/2015 16:12:29 Wound Measurements Length: (cm) 0.1 Width: (cm) 0.1 Depth: (cm) 0.1 Area: (cm) 0.008 Volume: (cm) 0.001 % Reduction in Area: 83% % Reduction in Volume: 80% Epithelialization: None Tunneling: No Undermining: No Wound Description  Classification: Partial Thickness Foul Odor A Diabetic Severity (Wagner): Grade 1 Wound Margin: Flat and Intact Exudate Amount: None Present fter Cleansing: No Wound Bed Granulation Amount: None Present (0%) Exposed Structure Necrotic Amount: Large (67-100%) Fascia Exposed: No Necrotic Quality: Eschar Fat Layer Exposed: No Tendon Exposed: No Muscle Exposed: No Poinsett, Shahad [name]. ([id number]) Joint Exposed: No Bone Exposed: No Limited to Skin Breakdown Periwound Skin Texture Texture Color No Abnormalities Noted: No No Abnormalities Noted: No Callus: No Atrophie Blanche: No Crepitus: No Cyanosis: No Excoriation: No Ecchymosis: No Fluctuance: No Erythema: No Friable: No Hemosiderin Staining: No Induration: No Mottled: No Localized Edema: Yes Pallor: No Rash: No Rubor: No Scarring: No Temperature / Pain Moisture Temperature: No Abnormality No Abnormalities Noted: No Tenderness on Palpation: Yes Dry / Scaly: Yes Maceration: No Moist: No Wound Preparation Ulcer Cleansing: Rinsed/Irrigated with Saline Topical Anesthetic Applied: Other: lidocaine 4%, Treatment Notes Wound #1 (Right, Proximal Lower Leg) 1. Cleansed with: Clean wound with Normal Saline 4. Dressing Applied: Hydrogel 5. Secondary Dressing Applied Gauze and Kerlix/Conform 7. Secured with Tape Notes hydrogel with silver Electronic Signature(s) Signed: 02/07/2015 4:25:16 PM By: Elpidio Eric BSN, RN Entered By: Elpidio Eric on 02/07/2015 09:51:56 Degraaf, Ermelinda Rexene [name] ([id number]) -------------------------------------------------------------------------------- Wound Assessment Details Patient Name: [name] [name]. Date of  Service: 02/07/2015 9:30 AM Medical Record Number: [id number] Patient Account Number: [id number] Date of Birth/Sex: 1956/06/05 (58 y.o. Female) Treating RN: Afful, RN, BSN,  Sink Primary Care Physician: Fidel Levy Other Clinician: Referring Physician: Fidel Levy Treating Physician/Extender: Rudene Re in Treatment: 1 Wound Status Wound Number: 2 Primary Etiology: Venous Leg Ulcer Wound Location: Right Lower Leg - Distal Wound Status: Open Wounding Event: Gradually Appeared Comorbid History: Hypertension, Type II Diabetes Date Acquired: 01/09/2015 Weeks Of Treatment: 1 Clustered Wound: No Photos Photo Uploaded By: Elpidio Eric on 02/07/2015 16:12:29 Wound Measurements Length: (cm) 0.4 Width: (cm) 0.5 Depth: (cm) 0.1 Area: (cm) 0.157 Volume: (cm) 0.016 % Reduction in Area: -67% % Reduction in Volume: -77.8% Epithelialization: None Tunneling: No Undermining: No Wound Description Classification: Partial Thickness Foul Odor A Diabetic Severity (Wagner): Grade 1 Wound Margin: Flat and Intact Exudate Amount: None Present fter Cleansing: No Wound Bed Granulation Amount: None Present (0%) Exposed Structure Necrotic Amount: Large (67-100%) Fascia Exposed: No Necrotic Quality: Eschar Fat Layer Exposed: No Tendon Exposed: No Muscle Exposed: No Reames, Emmaclaire [name]. ([id number]) Joint Exposed: No Bone Exposed: No Limited to Skin Breakdown Periwound Skin Texture Texture Color No Abnormalities Noted: No No Abnormalities Noted: No Callus: No Atrophie Blanche: No Crepitus: No Cyanosis: No Excoriation: No Ecchymosis: No Fluctuance: No Erythema: No Friable: No Hemosiderin Staining: No Induration: No Mottled: No Localized Edema: Yes Pallor: No Rash: No Rubor: No Scarring: No Temperature / Pain Moisture Temperature: No Abnormality No Abnormalities Noted: No Tenderness on Palpation: Yes Dry / Scaly: Yes Maceration: No Moist: No Wound  Preparation Ulcer Cleansing: Rinsed/Irrigated with Saline Topical Anesthetic Applied: Other: lidocaine 4%, Treatment Notes Wound #2 (Right, Distal Lower Leg) 1. Cleansed with: Clean wound with Normal Saline 4. Dressing Applied: Hydrogel 5. Secondary Dressing Applied Gauze and Kerlix/Conform 7. Secured with Tape Notes hydrogel with silver Electronic Signature(s) Signed: 02/07/2015 4:25:16 PM By: Elpidio Eric BSN, RN Entered By: Elpidio Eric on 02/07/2015 09:52:27 Stolarz, Rickayla Rexene [name] ([id number]) -------------------------------------------------------------------------------- Wound Assessment Details Patient Name: Hedges, Yarithza [name]. Date of Service: 02/07/2015 9:30 AM Medical Record Number: [id number] Patient Account Number: [id number] Date of Birth/Sex: May 22, 1956 (58 y.o. Female) Treating RN: Afful, RN, BSN, [facility] Primary Care Physician:  Fidel Levy Other Clinician: Referring Physician: Fidel Levy Treating Physician/Extender: Rudene Re in Treatment: 1 Wound Status Wound Number: 3 Primary Etiology: Venous Leg Ulcer Wound Location: Left Lower Leg - Posterior, Wound Status: Open Proximal Comorbid History: Hypertension, Type II Diabetes Wounding Event: Gradually Appeared Date Acquired: 01/09/2015 Weeks Of Treatment: 1 Clustered Wound: No Photos Photo Uploaded By: Elpidio Eric on 02/07/2015 16:14:25 Wound Measurements Length: (cm) 3 Width: (cm) 3 Depth: (cm) 0.1 Area: (cm) 7.069 Volume: (cm) 0.707 % Reduction in Area: 35.7% % Reduction in Volume: 35.7% Epithelialization: None Tunneling: No Undermining: No Wound Description Full Thickness Without Classification: Exposed Support Structures Diabetic Severity Grade 2 (Wagner): Wound Margin: Flat and Intact Exudate Amount: None Present Foul Odor After Cleansing: No Wound Bed Granulation Amount: None Present (0%) Exposed Structure Necrotic Amount: Large (67-100%) Fascia Exposed: No Necrotic  Quality: Eschar, [name] Fat Layer Exposed: No Ragen, Kaylah [name]. ([id number]) Tendon Exposed: No Muscle Exposed: No Joint Exposed: No Bone Exposed: No Limited to Skin Breakdown Periwound Skin Texture Texture Color No Abnormalities Noted: No No Abnormalities Noted: No Callus: No Atrophie Blanche: No Crepitus: No Cyanosis: No Excoriation: No Ecchymosis: No Fluctuance: No Erythema: No Friable: No Hemosiderin Staining: No Induration: No Mottled: No Localized Edema: Yes Pallor: No Rash: No Rubor: No Scarring: No Temperature / Pain Moisture Temperature: No Abnormality No Abnormalities Noted: No Tenderness on Palpation: Yes Dry / Scaly: Yes Maceration: No Moist: No Wound Preparation Ulcer Cleansing: Rinsed/Irrigated with Saline Topical Anesthetic Applied: Other: lidocaine 4%, Treatment Notes Wound #3 (Left, Proximal, Posterior Lower Leg) 1. Cleansed with: Clean wound with Normal Saline 4. Dressing Applied: Hydrogel 5. Secondary Dressing Applied Gauze and Kerlix/Conform 7. Secured with Tape Notes hydrogel with silver Electronic Signature(s) Signed: 02/07/2015 4:25:16 PM By: Elpidio Eric BSN, RN Entered By: Elpidio Eric on 02/07/2015 09:53:00 Ozdemir, Aaron [name] ([id number]) -------------------------------------------------------------------------------- Wound Assessment Details Patient Name: Jaggi, Layliana [name]. Date of Service: 02/07/2015 9:30 AM Medical Record Number: [id number] Patient Account Number: [id number] Date of Birth/Sex: January 07, 1956 (58 y.o. Female) Treating RN: Afful, RN, BSN, [name] [facility] Primary Care Physician: Fidel Levy Other Clinician: Referring Physician: Fidel Levy Treating Physician/Extender: Rudene Re in Treatment: 1 Wound Status Wound Number: 4 Primary Etiology: Venous Leg Ulcer Wound Location: Left Lower Leg - Posterior, Wound Status: Open Distal Comorbid History: Hypertension, Type II Diabetes Wounding Event:  Gradually Appeared Date Acquired: 01/09/2015 Weeks Of Treatment: 1 Clustered Wound: No Photos Photo Uploaded By: Elpidio Eric on 02/07/2015 16:14:25 Wound Measurements Length: (cm) 5 Width: (cm) 4 Depth: (cm) 0.1 Area: (cm) 15.708 Volume: (cm) 1.571 % Reduction in Area: 6.8% % Reduction in Volume: 6.8% Epithelialization: None Tunneling: No Undermining: No Wound Description Full Thickness Without Classification: Exposed Support Structures Diabetic Severity Grade 2 (Wagner): Wound Margin: Flat and Intact Exudate Amount: None Present Foul Odor After Cleansing: No Wound Bed Granulation Amount: None Present (0%) Exposed Structure Necrotic Amount: Large (67-100%) Fascia Exposed: No Necrotic Quality: Eschar, [name] Fat Layer Exposed: No Benzel, Katelin [name]. ([id number]) Tendon Exposed: No Muscle Exposed: No Joint Exposed: No Bone Exposed: No Limited to Skin Breakdown Periwound Skin Texture Texture Color No Abnormalities Noted: No No Abnormalities Noted: No Callus: No Atrophie Blanche: No Crepitus: No Cyanosis: No Excoriation: No Ecchymosis: No Fluctuance: No Erythema: No Friable: No Hemosiderin Staining: No Induration: No Mottled: No Localized Edema: Yes Pallor: No Rash: No Rubor: No Scarring: No Temperature / Pain Moisture Temperature: No Abnormality No Abnormalities Noted: No Tenderness on Palpation: Yes Dry /  Scaly: Yes Maceration: No Moist: No Wound Preparation Ulcer Cleansing: Rinsed/Irrigated with Saline Topical Anesthetic Applied: Other: lidocaine 4%, Treatment Notes Wound #4 (Left, Distal, Posterior Lower Leg) 1. Cleansed with: Clean wound with Normal Saline 4. Dressing Applied: Hydrogel 5. Secondary Dressing Applied Gauze and Kerlix/Conform 7. Secured with Tape Notes hydrogel with silver Electronic Signature(s) Signed: 02/07/2015 4:25:16 PM By: Elpidio Eric BSN, RN Entered By: Elpidio Eric on 02/07/2015 09:53:30 Beman,  Aaron [name] ([id number]) -------------------------------------------------------------------------------- Wound Assessment Details Patient Name: Wissinger, Janiyah [name]. Date of Service: 02/07/2015 9:30 AM Medical Record Number: [id number] Patient Account Number: [id number] Date of Birth/Sex: [birth year] (58 y.o. Female) Treating RN: Afful, RN, BSN, [place] [facility] Primary Care Physician: Fidel Levy Other Clinician: Referring Physician: Fidel Levy Treating Physician/Extender: Rudene Re in Treatment: 1 Wound Status Wound Number: 5 Primary Etiology: Venous Leg Ulcer Wound Location: Left Lower Leg - Lateral, Wound Status: Open Proximal Comorbid History: Hypertension, Type II Diabetes Wounding Event: Gradually Appeared Date Acquired: 01/09/2015 Weeks Of Treatment: 1 Clustered Wound: No Photos Photo Uploaded By: Elpidio Eric on 02/07/2015 16:14:26 Wound Measurements Length: (cm) 4 Width: (cm) 4.5 Depth: (cm) 0.1 Area: (cm) 14.137 Volume: (cm) 1.414 % Reduction in Area: -31.6% % Reduction in Volume: -31.7% Epithelialization: None Tunneling: No Undermining: No Wound Description Full Thickness Without Classification: Exposed Support Structures Diabetic Severity Grade 2 (Wagner): Wound Margin: Flat and Intact Exudate Amount: None Present Foul Odor After Cleansing: No Wound Bed Granulation Amount: None Present (0%) Exposed Structure Necrotic Amount: Large (67-100%) Fascia Exposed: No Necrotic Quality: Eschar, [name] Fat Layer Exposed: No Bohlin, Lien [name]. ([id number]) Tendon Exposed: No Muscle Exposed: No Joint Exposed: No Bone Exposed: No Limited to Skin Breakdown Periwound Skin Texture Texture Color No Abnormalities Noted: No No Abnormalities Noted: No Callus: No Atrophie Blanche: No Crepitus: No Cyanosis: No Excoriation: No Ecchymosis: No Fluctuance: No Erythema: No Friable: No Hemosiderin Staining: No Induration: No Mottled:  No Localized Edema: Yes Pallor: No Rash: No Rubor: No Scarring: No Temperature / Pain Moisture Temperature: No Abnormality No Abnormalities Noted: No Tenderness on Palpation: Yes Dry / Scaly: Yes Maceration: No Moist: No Wound Preparation Ulcer Cleansing: Rinsed/Irrigated with Saline Topical Anesthetic Applied: Other: lidocaine 4%, Treatment Notes Wound #5 (Left, Proximal, Lateral Lower Leg) 1. Cleansed with: Clean wound with Normal Saline 4. Dressing Applied: Hydrogel 5. Secondary Dressing Applied Gauze and Kerlix/Conform 7. Secured with Tape Notes hydrogel with silver Electronic Signature(s) Signed: 02/07/2015 4:25:16 PM By: Elpidio Eric BSN, RN Entered By: Elpidio Eric on 02/07/2015 09:53:58 [place], Lelania Rexene [name] ([id number]) -------------------------------------------------------------------------------- Wound Assessment Details Patient Name: Virgo, Dareth [name]. Date of Service: 02/07/2015 9:30 AM Medical Record Number: [id number] Patient Account Number: [id number] Date of Birth/Sex: Jun 14, 1956 (58 y.o. Female) Treating RN: Afful, RN, BSN, [place] [facility] Primary Care Physician: Fidel Levy Other Clinician: Referring Physician: Fidel Levy Treating Physician/Extender: Rudene Re in Treatment: 1 Wound Status Wound Number: 6 Primary Etiology: Venous Leg Ulcer Wound Location: Left Lower Leg - Lateral, Distal Wound Status: Open Wounding Event: Gradually Appeared Comorbid History: Hypertension, Type II Diabetes Date Acquired: 01/09/2015 Weeks Of Treatment: 1 Clustered Wound: No Photos Photo Uploaded By: Elpidio Eric on 02/07/2015 16:15:32 Wound Measurements Length: (cm) 7 Width: (cm) 2.5 Depth: (cm) 0.1 Area: (cm) 13.744 Volume: (cm) 1.374 % Reduction in Area: 4.1% % Reduction in Volume: 4.1% Epithelialization: None Tunneling: No Undermining: No Wound Description Full Thickness Without Classification: Exposed Support Structures Diabetic  Severity Grade 2 (Wagner): Wound Margin: Flat and Intact Exudate Amount: None Present  Foul Odor After Cleansing: No Wound Bed Granulation Amount: None Present (0%) Exposed Structure Necrotic Amount: Large (67-100%) Fascia Exposed: No Necrotic Quality: Eschar, [name] Fat Layer Exposed: No Tendon Exposed: No Kessenich, Tayley [name]. ([id number]) Muscle Exposed: No Joint Exposed: No Bone Exposed: No Limited to Skin Breakdown Periwound Skin Texture Texture Color No Abnormalities Noted: No No Abnormalities Noted: No Callus: No Atrophie Blanche: No Crepitus: No Cyanosis: No Excoriation: No Ecchymosis: No Fluctuance: No Erythema: No Friable: No Hemosiderin Staining: No Induration: No Mottled: No Localized Edema: Yes Pallor: No Rash: No Rubor: No Scarring: No Temperature / Pain Moisture Temperature: No Abnormality No Abnormalities Noted: No Tenderness on Palpation: Yes Dry / Scaly: Yes Maceration: No Moist: No Wound Preparation Ulcer Cleansing: Rinsed/Irrigated with Saline Topical Anesthetic Applied: Other: lidocaine 4%, Treatment Notes Wound #6 (Left, Distal, Lateral Lower Leg) 1. Cleansed with: Clean wound with Normal Saline 4. Dressing Applied: Hydrogel 5. Secondary Dressing Applied Gauze and Kerlix/Conform 7. Secured with Tape Notes hydrogel with silver Electronic Signature(s) Signed: 02/07/2015 4:25:16 PM By: Elpidio Eric BSN, RN Entered By: Elpidio Eric on 02/07/2015 09:54:26 Berko, Jere Rexene [name] ([id number]) -------------------------------------------------------------------------------- Wound Assessment Details Patient Name: Kovalenko, Dejanee [name]. Date of Service: 02/07/2015 9:30 AM Medical Record Number: [id number] Patient Account Number: [id number] Date of Birth/Sex: 1956-02-24 (58 y.o. Female) Treating RN: Afful, RN, BSN, [place] [facility] Primary Care Physician: Fidel Levy Other Clinician: Referring Physician: Fidel Levy Treating  Physician/Extender: Rudene Re in Treatment: 1 Wound Status Wound Number: 7 Primary Etiology: Arterial Insufficiency Ulcer Wound Location: Left Lower Leg - Medial Wound Status: Open Wounding Event: Gradually Appeared Comorbid History: Hypertension, Type II Diabetes Date Acquired: 01/31/2015 Weeks Of Treatment: 0 Clustered Wound: No Photos Photo Uploaded By: Elpidio Eric on 02/07/2015 16:15:33 Wound Measurements Length: (cm) 5 Width: (cm) 2.5 Depth: (cm) 0.1 Area: (cm) 9.817 Volume: (cm) 0.982 % Reduction in Area: 0% % Reduction in Volume: 0% Tunneling: No Undermining: No Wound Description Classification: Unclassifiable Diabetic Severity Unable to visualize wound [address]): bed Wound Margin: Distinct, outline attached Exudate Amount: None Present Foul Odor After Cleansing: No Wound Bed Granulation Amount: None Present (0%) Necrotic Amount: Large (67-100%) Necrotic Quality: Eschar Periwound Skin Texture Thelen, Alsha [name]. ([id number]) Texture Color No Abnormalities Noted: No No Abnormalities Noted: No Callus: No Atrophie Blanche: No Crepitus: No Cyanosis: No Excoriation: No Ecchymosis: No Fluctuance: No Erythema: No Friable: No Hemosiderin Staining: No Induration: No Mottled: No Localized Edema: Yes Pallor: No Rash: No Rubor: No Scarring: No Temperature / Pain Moisture Temperature: No Abnormality No Abnormalities Noted: No Tenderness on Palpation: Yes Dry / Scaly: Yes Maceration: No Moist: No Wound Preparation Ulcer Cleansing: Rinsed/Irrigated with Saline Topical Anesthetic Applied: Other: lidocaine 4%, Treatment Notes Wound #7 (Left, Medial Lower Leg) 1. Cleansed with: Clean wound with Normal Saline 4. Dressing Applied: Hydrogel 5. Secondary Dressing Applied Gauze and Kerlix/Conform 7. Secured with Tape Notes hydrogel with silver Electronic Signature(s) Signed: 02/07/2015 4:25:16 PM By: Elpidio Eric BSN, RN Entered By: Elpidio Eric on 02/07/2015 09:56:36 Tamburrino, Aaron [name] ([id number]) -------------------------------------------------------------------------------- Vitals Details Patient Name: Sandiford, Lanique [name]. Date of Service: 02/07/2015 9:30 AM Medical Record Number: [id number] Patient Account Number: [id number] Date of Birth/Sex: [birth year] (58 y.o. Female) Treating RN: Afful, RN, BSN, [place] [facility] Primary Care Physician: Fidel Levy Other Clinician: Referring Physician: Fidel Levy Treating Physician/Extender: Rudene Re in Treatment: 1 Vital Signs Time Taken: 09:37 Temperature (F): 98.2 Height (in): 68 Pulse (bpm): 76 Weight (lbs): 294 Respiratory Rate (breaths/min): 18 Body Mass Index (  BMI): 44.7 Blood Pressure (mmHg): 133/60 Reference Range: 80 - 120 mg / dl Electronic Signature(s) Signed: 02/07/2015 9:38:10 AM By: Elpidio Eric BSN, RN Entered By: Elpidio Eric on 02/07/2015 09:38:09

## 2015-02-07 NOTE — Progress Notes (Signed)
HERMENA, SWINT ([id number]) Visit Report for 02/07/2015 Chief Complaint Document Details Patient Name: [name], RAMP 02/07/2015 9:30 Date of Service: AM Medical Record [id number] Number: Patient Account Number: [id number] [birth year] (59 y.o. Treating RN: Date of Birth/Sex: Female) Other Clinician: Primary Care Physician: Jonna Clark, JAMES Treating Evlyn Kanner Referring Physician: Fidel Levy Physician/Extender: Tania Ade in Treatment: 1 Information Obtained from: Patient Chief Complaint Patient presents to the wound care center for a consult due non healing wound. This 59 year old patient has had ulcerated areas on the left lower extremity more than the right lower extremity to very painful for the last 3 weeks. Electronic Signature(s) Signed: 02/07/2015 10:15:05 AM By: Evlyn Kanner MD, FACS Entered By: Evlyn Kanner on 02/07/2015 10:15:05 Bivins, [name] ([id number]) -------------------------------------------------------------------------------- HPI Details Patient Name: [name], [name] 02/07/2015 9:30 Date of Service: AM Medical Record [id number] Number: Patient Account Number: [id number] Mar 01, 1956 (58 y.o. Treating RN: Date of Birth/Sex: Female) Other Clinician: Primary Care Physician: Jonna Clark, JAMES Treating Evlyn Kanner Referring Physician: Fidel Levy Physician/Extender: Weeks in Treatment: 1 History of Present Illness Location: Painful ulcerated areas left lower extremity more than right lower extremity Quality: Patient reports experiencing a sharp pain to affected area(s). Severity: Patient states wound are getting worse. Duration: Patient has had the wound for < 4 weeks prior to presenting for treatment Timing: Pain in wound is Intermittent (comes and goes Context: The wound appeared gradually over time Modifying Factors: Other treatment(s) tried include: she has had Keflex for about 10 days. Associated Signs and Symptoms: Patient  reports having difficulty standing for long periods. HPI Description: 59 year old patient recently seen by her PCP Dr. Venora Maples who saw her on 01/25/2015 4 ulcers both lower extremities which have been present for about 3 weeks. She took a ten-day course of Keflex and continues to have leg pain and swelling. Past medical history significant for chronic kidney disease, hypertension and diabetes mellitus without complications. She has never been a smoker. Mostly recent CBC was within normal limits, BMP showed that her glucose was 136 and a beard was 45 and a creatinine is 4.19. The patient has had chronic renal failure and had a left AV fistula placed by Dr. Wyn Quaker in May and she is getting ready for hemodialysis soon. She has not been put on any specific medications for this problem. Addendum: tried to get Dr. 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 all questions answered. 02/07/2015 -- the patient started hemodialysis yesterday but was not successful and is going to be seeing Dr. dew regain. She also has a vascular workup for early August. No further investigations or referrals have been made after my conversation with her PCPs office last week. I have asked the patient to call her PCPs office and discuss further investigations and referrals. Electronic Signature(s) Signed: 02/07/2015 10:16:24 AM By: Evlyn Kanner MD, FACS Entered By: Evlyn Kanner on 02/07/2015 10:16:24 [name] ([id number]) -------------------------------------------------------------------------------- Physical Exam Details Patient Name: [name], KOHL 02/07/2015 9:30 Date of Service: AM Medical Record [id number] Number: Patient Account Number: [id number] [birth year] (58 y.o. Treating RN: Date of Birth/Sex: Female) Other Clinician: Primary  Care Physician: Jonna Clark, JAMES Treating Evlyn Kanner Referring Physician: Fidel Levy Physician/Extender: Weeks in Treatment: 1 Constitutional . Pulse regular. Respirations normal and unlabored. Afebrile. . Eyes Nonicteric. Reactive to light. Ears,  Nose, Mouth, and Throat Lips, teeth, and gums WNL.Marland Kitchen Moist mucosa without lesions . Neck supple and nontender. No palpable supraclavicular or cervical adenopathy. Normal sized without goiter. Respiratory WNL. No retractions.. Cardiovascular Pedal Pulses WNL. No clubbing, cyanosis or edema. Lymphatic No adneopathy. No adenopathy. No adenopathy. Musculoskeletal Adexa without tenderness or enlargement.. Digits and nails w/o clubbing, cyanosis, infection, petechiae, ischemia, or inflammatory conditions.. Integumentary (Hair, Skin) No suspicious lesions. No crepitus or fluctuance. No peri-wound warmth or erythema. No masses.Marland Kitchen Psychiatric Judgement and insight Intact.. No evidence of depression, anxiety, or agitation.. Notes The left leg continues to have dry eschar which are painful to touch and the right leg has just 2 areas. 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 Electronic Signature(s) Signed: 02/07/2015 10:17:20 AM By: Evlyn Kanner MD, FACS Entered By: Evlyn Kanner on 02/07/2015 10:17:20 Sedgwick, [name] ([id number]) -------------------------------------------------------------------------------- Physician Orders Details Patient Name: Marron, Minami H. Date of Service: 02/07/2015 9:30 AM Medical Record Patient Account Number: [id number] Number: Afful, RN, BSN, Treating RN: 08-Aug-1955 (58 y.o. Cheriton Sink Date of Birth/Sex: Female) Other Clinician: Primary Care Physician: Jonna Clark, JAMES Treating Evlyn Kanner Referring Physician: Fidel Levy Physician/Extender: Tania Ade in Treatment: 1 Verbal / Phone Orders: Yes Clinician: Afful, RN, BSN, Rita Read Back and Verified: Yes Diagnosis Coding Wound  Cleansing Wound #1 Right,Proximal Lower Leg o Clean wound with Normal Saline. o May Shower, gently pat wound dry prior to applying new dressing. Wound #2 Right,Distal Lower Leg o Clean wound with Normal Saline. o May Shower, gently pat wound dry prior to applying new dressing. Wound #3 Left,Proximal,Posterior Lower Leg o Clean wound with Normal Saline. o May Shower, gently pat wound dry prior to applying new dressing. Wound #4 Left,Distal,Posterior Lower Leg o Clean wound with Normal Saline. o May Shower, gently pat wound dry prior to applying new dressing. Wound #5 Left,Proximal,Lateral Lower Leg o Clean wound with Normal Saline. o May Shower, gently pat wound dry prior to applying new dressing. Wound #6 Left,Distal,Lateral Lower Leg o Clean wound with Normal Saline. o May Shower, gently pat wound dry prior to applying new dressing. Anesthetic Wound #1 Right,Proximal Lower Leg o Topical Lidocaine 4% cream applied to wound bed prior to debridement Wound #2 Right,Distal Lower Leg o Topical Lidocaine 4% cream applied to wound bed prior to debridement Wound #3 Left,Proximal,Posterior Lower Leg o Topical Lidocaine 4% cream applied to wound bed prior to debridement Wound #4 Left,Distal,Posterior Lower Leg Bynum, Greysen H. ([id number]) o Topical Lidocaine 4% cream applied to wound bed prior to debridement Wound #5 Left,Proximal,Lateral Lower Leg o Topical Lidocaine 4% cream applied to wound bed prior to debridement Wound #6 Left,Distal,Lateral Lower Leg o Topical Lidocaine 4% cream applied to wound bed prior to debridement Primary Wound Dressing Wound #1 Right,Proximal Lower Leg o Hydrogel - with Ag Wound #2 Right,Distal Lower Leg o Hydrogel - with Ag Wound #3 Left,Proximal,Posterior Lower Leg o Hydrogel - with Ag Wound #4 Left,Distal,Posterior Lower Leg o Hydrogel - with Ag Wound #5 Left,Proximal,Lateral Lower Leg o Hydrogel - with  Ag Wound #6 Left,Distal,Lateral Lower Leg o Hydrogel - with Ag Secondary Dressing Wound #1 Right,Proximal Lower Leg o Gauze, ABD and Kerlix/Conform Wound #2 Right,Distal Lower Leg o Gauze, ABD and Kerlix/Conform Wound #3 Left,Proximal,Posterior Lower Leg o Gauze, ABD and Kerlix/Conform Wound #4 Left,Distal,Posterior Lower Leg o Gauze, ABD and Kerlix/Conform Wound #5 Left,Proximal,Lateral Lower Leg o Gauze, ABD and Kerlix/Conform Wound #6 Left,Distal,Lateral Lower Leg o Gauze, ABD and Kerlix/Conform Dressing Change Frequency Wound #1 Right,Proximal Lower Leg Monteith,  Ranata H. ([id number]) o Change dressing every day. Wound #2 Right,Distal Lower Leg o Change dressing every day. Wound #3 Left,Proximal,Posterior Lower Leg o Change dressing every day. Wound #4 Left,Distal,Posterior Lower Leg o Change dressing every day. Wound #5 Left,Proximal,Lateral Lower Leg o Change dressing every day. Wound #6 Left,Distal,Lateral Lower Leg o Change dressing every day. Follow-up Appointments Wound #1 Right,Proximal Lower Leg o Return Appointment in 1 week. Wound #2 Right,Distal Lower Leg o Return Appointment in 1 week. Wound #3 Left,Proximal,Posterior Lower Leg o Return Appointment in 1 week. Wound #4 Left,Distal,Posterior Lower Leg o Return Appointment in 1 week. Wound #5 Left,Proximal,Lateral Lower Leg o Return Appointment in 1 week. Wound #6 Left,Distal,Lateral Lower Leg o Return Appointment in 1 week. Edema Control Wound #1 Right,Proximal Lower Leg o Tubigrip Wound #2 Right,Distal Lower Leg o Tubigrip Wound #3 Left,Proximal,Posterior Lower Leg o Tubigrip Wound #4 Left,Distal,Posterior Lower Leg o Tubigrip Capron, Wilfred H. ([id number]) Wound #5 Left,Proximal,Lateral Lower Leg o Tubigrip Wound #6 Left,Distal,Lateral Lower Leg o Tubigrip Home Health Wound #1 Right,Proximal Lower Leg o Initiate Home Health for Skilled  Nursing o Home Health Nurse may visit PRN to address patientos wound care needs. o FACE TO FACE ENCOUNTER: MEDICARE and MEDICAID PATIENTS: I certify that this patient is under my care and that I had a face-to-face encounter that meets the physician face-to-face encounter requirements with this patient on this date. The encounter with the patient was in whole or in part for the following MEDICAL CONDITION: (primary reason for Home Healthcare) MEDICAL NECESSITY: I certify, that based on my findings, NURSING services are a medically necessary home health service. HOME BOUND STATUS: I certify that my clinical findings support that this patient is homebound (i.e., Due to illness or injury, pt requires aid of supportive devices such as crutches, cane, wheelchairs, walkers, the use of special transportation or the assistance of another person to leave their place of residence. There is a normal inability to leave the home and doing so requires considerable and taxing effort. Other absences are for medical reasons / religious services and are infrequent or of short duration when for other reasons). o If current dressing causes regression in wound condition, may D/C ordered dressing product/s and apply Normal Saline Moist Dressing daily until next Wound Healing Center / Other MD appointment. Notify Wound Healing Center of regression in wound condition at [phone]. o Please direct any NON-WOUND related issues/requests for orders to patient's Primary Care Physician Wound #2 Right,Distal Lower Leg o Initiate Home Health for Skilled Nursing o Home Health Nurse may visit PRN to address patientos wound care needs. o FACE TO FACE ENCOUNTER: MEDICARE and MEDICAID PATIENTS: I certify that this patient is under my care and that I had a face-to-face encounter that meets the physician face-to-face encounter requirements with this patient on this date. The encounter with the patient was in whole  or in part for the following MEDICAL CONDITION: (primary reason for Home Healthcare) MEDICAL NECESSITY: I certify, that based on my findings, NURSING services are a medically necessary home health service. HOME BOUND STATUS: I certify that my clinical findings support that this patient is homebound (i.e., Due to illness or injury, pt requires aid of supportive devices such as crutches, cane, wheelchairs, walkers, the use of special transportation or the assistance of another person to leave their place of residence. There is a normal inability to leave the home and doing so requires considerable and taxing effort. Other absences are for medical reasons / religious services  and are infrequent or of short duration when for other reasons). o If current dressing causes regression in wound condition, may D/C ordered dressing product/s and apply Normal Saline Moist Dressing daily until next Wound Healing Center / Other MD appointment. Notify Wound Healing Center of regression in wound condition at [phone]. o Please direct any NON-WOUND related issues/requests for orders to patient's Primary Care Physician TISHA, CLINE ([id number]) Wound #3 Left,Proximal,Posterior Lower Leg o Initiate Home Health for Skilled Nursing o Home Health Nurse may visit PRN to address patientos wound care needs. o FACE TO FACE ENCOUNTER: MEDICARE and MEDICAID PATIENTS: I certify that this patient is under my care and that I had a face-to-face encounter that meets the physician face-to-face encounter requirements with this patient on this date. The encounter with the patient was in whole or in part for the following MEDICAL CONDITION: (primary reason for Home Healthcare) MEDICAL NECESSITY: I certify, that based on my findings, NURSING services are a medically necessary home health service. HOME BOUND STATUS: I certify that my clinical findings support that this patient is homebound (i.e., Due to illness  or injury, pt requires aid of supportive devices such as crutches, cane, wheelchairs, walkers, the use of special transportation or the assistance of another person to leave their place of residence. There is a normal inability to leave the home and doing so requires considerable and taxing effort. Other absences are for medical reasons / religious services and are infrequent or of short duration when for other reasons). o If current dressing causes regression in wound condition, may D/C ordered dressing product/s and apply Normal Saline Moist Dressing daily until next Wound Healing Center / Other MD appointment. Notify Wound Healing Center of regression in wound condition at [phone]. o Please direct any NON-WOUND related issues/requests for orders to patient's Primary Care Physician Wound #4 Left,Distal,Posterior Lower Leg o Initiate Home Health for Skilled Nursing o Home Health Nurse may visit PRN to address patientos wound care needs. o FACE TO FACE ENCOUNTER: MEDICARE and MEDICAID PATIENTS: I certify that this patient is under my care and that I had a face-to-face encounter that meets the physician face-to-face encounter requirements with this patient on this date. The encounter with the patient was in whole or in part for the following MEDICAL CONDITION: (primary reason for Home Healthcare) MEDICAL NECESSITY: I certify, that based on my findings, NURSING services are a medically necessary home health service. HOME BOUND STATUS: I certify that my clinical findings support that this patient is homebound (i.e., Due to illness or injury, pt requires aid of supportive devices such as crutches, cane, wheelchairs, walkers, the use of special transportation or the assistance of another person to leave their place of residence. There is a normal inability to leave the home and doing so requires considerable and taxing effort. Other absences are for medical reasons / religious  services and are infrequent or of short duration when for other reasons). o If current dressing causes regression in wound condition, may D/C ordered dressing product/s and apply Normal Saline Moist Dressing daily until next Wound Healing Center / Other MD appointment. Notify Wound Healing Center of regression in wound condition at [phone]. o Please direct any NON-WOUND related issues/requests for orders to patient's Primary Care Physician Wound #5 Left,Proximal,Lateral Lower Leg o Initiate Home Health for Skilled Nursing o Home Health Nurse may visit PRN to address patientos wound care needs. o FACE TO FACE ENCOUNTER: MEDICARE and MEDICAID PATIENTS: I certify that this patient is  under my care and that I had a face-to-face encounter that meets the physician face-to-face encounter requirements with this patient on this date. The encounter with the patient was in whole or in part for the following MEDICAL CONDITION: (primary reason for Home Healthcare) JAELYNNE, HOCKLEY ([id number]) MEDICAL NECESSITY: I certify, that based on my findings, NURSING services are a medically necessary home health service. HOME BOUND STATUS: I certify that my clinical findings support that this patient is homebound (i.e., Due to illness or injury, pt requires aid of supportive devices such as crutches, cane, wheelchairs, walkers, the use of special transportation or the assistance of another person to leave their place of residence. There is a normal inability to leave the home and doing so requires considerable and taxing effort. Other absences are for medical reasons / religious services and are infrequent or of short duration when for other reasons). o If current dressing causes regression in wound condition, may D/C ordered dressing product/s and apply Normal Saline Moist Dressing daily until next Wound Healing Center / Other MD appointment. Notify Wound Healing Center of regression in wound  condition at [phone]. o Please direct any NON-WOUND related issues/requests for orders to patient's Primary Care Physician Wound #6 Left,Distal,Lateral Lower Leg o Initiate Home Health for Skilled Nursing o Home Health Nurse may visit PRN to address patientos wound care needs. o FACE TO FACE ENCOUNTER: MEDICARE and MEDICAID PATIENTS: I certify that this patient is under my care and that I had a face-to-face encounter that meets the physician face-to-face encounter requirements with this patient on this date. The encounter with the patient was in whole or in part for the following MEDICAL CONDITION: (primary reason for Home Healthcare) MEDICAL NECESSITY: I certify, that based on my findings, NURSING services are a medically necessary home health service. HOME BOUND STATUS: I certify that my clinical findings support that this patient is homebound (i.e., Due to illness or injury, pt requires aid of supportive devices such as crutches, cane, wheelchairs, walkers, the use of special transportation or the assistance of another person to leave their place of residence. There is a normal inability to leave the home and doing so requires considerable and taxing effort. Other absences are for medical reasons / religious services and are infrequent or of short duration when for other reasons). o If current dressing causes regression in wound condition, may D/C ordered dressing product/s and apply Normal Saline Moist Dressing daily until next Wound Healing Center / Other MD appointment. Notify Wound Healing Center of regression in wound condition at [phone]. o Please direct any NON-WOUND related issues/requests for orders to patient's Primary Care Physician Electronic Signature(s) Signed: 02/07/2015 10:11:51 AM By: Elpidio Eric BSN, RN Signed: 02/07/2015 12:36:54 PM By: Evlyn Kanner MD, FACS Entered By: Elpidio Eric on 02/07/2015 10:11:50 Berninger, [name]  ([id number]) -------------------------------------------------------------------------------- Problem List Details Patient Name: [name], [name] 02/07/2015 9:30 Date of Service: AM Medical Record [id number] Number: Patient Account Number: [id number] [birth year] (58 y.o. 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 Treating RN: Date of Birth/Sex: Female) Other Clinician: Primary Care Physician: Jonna Clark, JAMES Treating Evlyn Kanner Referring Physician: Fidel Levy Physician/Extender: Tania Ade in Treatment: 1 Subjective Chief Complaint Information obtained from Patient Patient presents to the wound care center for a consult due non healing wound. This 59 year old patient has had ulcerated areas on the left lower extremity more than the right lower extremity to very painful for the last 3 weeks. History of Present Illness (HPI) The following HPI elements were documented for the patient's  wound: Location: Painful ulcerated areas left lower extremity more than right lower extremity Quality: Patient reports experiencing a sharp pain to affected area(s). Severity: Patient states wound are getting worse. Duration: Patient has had the wound for < 4 weeks prior to presenting for treatment Timing: Pain in wound is Intermittent (comes and goes Context: The wound appeared gradually over time Modifying Factors: Other treatment(s) tried include: she has had Keflex for about 10 days. Associated Signs and Symptoms: Patient reports having difficulty standing for long periods. 59 year old patient recently seen by her PCP Dr. Venora Maples who saw her on 01/25/2015 4 ulcers both lower extremities which have been present for about 3 weeks. She took a ten-day course of Keflex and continues to have leg pain and swelling. Past medical history significant for chronic kidney disease, hypertension and diabetes mellitus without complications. She has never been a smoker. Mostly recent CBC was within normal limits, BMP showed that her glucose was 136 and a beard was 45 and a creatinine is 4.19. The patient has had chronic renal failure and had a left AV fistula placed by Dr. Wyn Quaker in May and she is getting ready for hemodialysis soon. She has not been put on any specific medications for this problem. Addendum: tried to get Dr. 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 all questions answered. 02/07/2015 -- the patient started hemodialysis yesterday but was not successful and is going to be seeing Dr. dew regain. She also has a vascular workup for early August. No further investigations or referrals have been made after my conversation with her PCPs office last week. I have asked the patient to call her PCPs office and discuss further  investigations and referrals. Poitras, Elgene H. ([id number]) Objective Constitutional Pulse regular. Respirations normal and unlabored. Afebrile. Vitals Time Taken: 9:37 AM, Height: 68 in, Weight: 294 lbs, BMI: 44.7, Temperature: 98.2 F, Pulse: 76 bpm, Respiratory Rate: 18 breaths/min, Blood Pressure: 133/60 mmHg. Eyes Nonicteric. Reactive to light. Ears, Nose, Mouth, and Throat Lips, teeth, and gums WNL.Marland Kitchen Moist mucosa without lesions . Neck supple and nontender. No palpable supraclavicular or cervical adenopathy. Normal sized without goiter. Respiratory WNL. No retractions.. Cardiovascular Pedal Pulses WNL. No clubbing, cyanosis or edema. Lymphatic No adneopathy. No adenopathy. No adenopathy. Musculoskeletal Adexa without tenderness or enlargement.. Digits and nails w/o clubbing, cyanosis, infection, petechiae, ischemia, or inflammatory conditions.Marland Kitchen Psychiatric Judgement and insight Intact.. No evidence of depression, anxiety, or agitation.. General Notes: The left leg continues to have dry eschar which are painful to touch and the right leg has  just 2 areas. 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 Integumentary (Hair, Skin) No suspicious lesions. No crepitus or fluctuance. No peri-wound warmth or erythema. No masses.. Wound #1 status is Open. Original cause of wound was Gradually Appeared. The wound is located on the Right,Proximal Lower Leg. The wound measures 0.1cm length x 0.1cm width x 0.1cm depth; 0.008cm^2 area and 0.001cm^3 volume. The wound is limited to skin breakdown. There is no tunneling or undermining noted. There is a none present amount of drainage noted. The wound margin is flat and intact. There is no granulation within the wound bed. There is a large (67-100%) amount of necrotic tissue within the wound bed including Eschar. The periwound skin appearance exhibited: Localized Edema, Dry/Scaly. The Sortor, Marvette H. ([id number]) periwound skin appearance did not  exhibit: Callus, Crepitus, Excoriation, Fluctuance, Friable, Induration, Rash, Scarring, Maceration, Moist, Atrophie Blanche, Cyanosis, Ecchymosis, Hemosiderin Staining, Mottled, Pallor, Rubor, Erythema. Periwound temperature was noted as No Abnormality. The periwound has tenderness on palpation. Wound #2 status is Open. Original cause of wound was Gradually Appeared. The wound is located on the Right,Distal Lower Leg. The wound measures 0.4cm length x 0.5cm width x 0.1cm depth; 0.157cm^2 area and 0.016cm^3 volume. The wound is limited to skin breakdown. There is no tunneling or undermining noted. There is a none present amount of drainage noted. The wound margin is flat and intact. There is no granulation within the wound bed. There is a large (67-100%) amount of necrotic tissue within the wound bed including Eschar. The periwound skin appearance exhibited: Localized Edema, Dry/Scaly. The periwound skin appearance did not exhibit: Callus, Crepitus, Excoriation, Fluctuance, Friable, Induration, Rash, Scarring, Maceration, Moist, Atrophie Blanche, Cyanosis, Ecchymosis, Hemosiderin Staining, Mottled, Pallor, Rubor, Erythema. Periwound temperature was noted as No Abnormality. The periwound has tenderness on palpation. Wound #3 status is Open. Original cause of wound was Gradually Appeared. The wound is located on the Left,Proximal,Posterior Lower Leg. The wound measures 3cm length x 3cm width x 0.1cm depth; 7.069cm^2 area and 0.707cm^3 volume. The wound is limited to skin breakdown. There is no tunneling or undermining noted. There is a none present amount of drainage noted. The wound margin is flat and intact. There is no granulation within the wound bed. There is a large (67-100%) amount of necrotic tissue within the wound bed including Eschar and Adherent Slough. The periwound skin appearance exhibited: Localized Edema, Dry/Scaly. The periwound skin appearance did not exhibit: Callus, Crepitus,  Excoriation, Fluctuance, Friable, Induration, Rash, Scarring, Maceration, Moist, Atrophie Blanche, Cyanosis, Ecchymosis, Hemosiderin Staining, Mottled, Pallor, Rubor, Erythema. Periwound temperature was noted as No Abnormality. The periwound has tenderness on palpation. Wound #4 status is Open. Original cause of wound was Gradually Appeared. The wound is located on the Left,Distal,Posterior Lower Leg. The wound measures 5cm length x 4cm width x 0.1cm depth; 15.708cm^2 area and 1.571cm^3 volume. The wound is limited to skin breakdown. There is no tunneling or undermining noted. There is a none present amount of drainage noted. The wound margin is flat and intact. There is no granulation within the wound bed. There is a large (67-100%) amount of necrotic tissue within the wound bed including Eschar and Adherent Slough. The periwound skin appearance exhibited: Localized Edema, Dry/Scaly. The periwound skin appearance did not exhibit: Callus, Crepitus, Excoriation, Fluctuance, Friable, Induration, Rash, Scarring, Maceration, Moist, Atrophie Blanche, Cyanosis, Ecchymosis, Hemosiderin Staining, Mottled, Pallor, Rubor, Erythema. Periwound temperature was noted as No Abnormality. The periwound has tenderness on palpation. Wound #5 status is Open. Original cause of wound was Gradually Appeared.  The wound is located on the Left,Proximal,Lateral Lower Leg. The wound measures 4cm length x 4.5cm width x 0.1cm depth; 14.137cm^2 area and 1.414cm^3 volume. The wound is limited to skin breakdown. There is no tunneling or undermining noted. There is a none present amount of drainage noted. The wound margin is flat and intact. There is no granulation within the wound bed. There is a large (67-100%) amount of necrotic tissue within the wound bed including Eschar and Adherent Slough. The periwound skin appearance exhibited: Localized Edema, Dry/Scaly. The periwound skin appearance did not exhibit: Callus, Crepitus,  Excoriation, Fluctuance, Friable, Induration, Rash, Scarring, Maceration, Moist, Atrophie Blanche, Cyanosis, Ecchymosis, Hemosiderin Staining, Mottled, Pallor, Rubor, Erythema. Periwound temperature was noted as No Abnormality. The periwound has tenderness on palpation. Wound #6 status is Open. Original cause of wound was Gradually Appeared. The wound is located on the Left,Distal,Lateral Lower Leg. The wound measures 7cm length x 2.5cm width x 0.1cm depth; 13.744cm^2 Stallsmith, Lorenda H. ([id number]) area and 1.374cm^3 volume. The wound is limited to skin breakdown. There is no tunneling or undermining noted. There is a none present amount of drainage noted. The wound margin is flat and intact. There is no granulation within the wound bed. There is a large (67-100%) amount of necrotic tissue within the wound bed including Eschar and Adherent Slough. The periwound skin appearance exhibited: Localized Edema, Dry/Scaly. The periwound skin appearance did not exhibit: Callus, Crepitus, Excoriation, Fluctuance, Friable, Induration, Rash, Scarring, Maceration, Moist, Atrophie Blanche, Cyanosis, Ecchymosis, Hemosiderin Staining, Mottled, Pallor, Rubor, Erythema. Periwound temperature was noted as No Abnormality. The periwound has tenderness on palpation. Wound #7 status is Open. Original cause of wound was Gradually Appeared. The wound is located on the Left,Medial Lower Leg. The wound measures 5cm length x 2.5cm width x 0.1cm depth; 9.817cm^2 area and 0.982cm^3 volume. There is no tunneling or undermining noted. There is a none present amount of drainage noted. The wound margin is distinct with the outline attached to the wound base. There is no granulation within the wound bed. There is a large (67-100%) amount of necrotic tissue within the wound bed including Eschar. The periwound skin appearance exhibited: Localized Edema, Dry/Scaly. The periwound skin appearance did not exhibit: Callus, Crepitus,  Excoriation, Fluctuance, Friable, Induration, Rash, Scarring, Maceration, Moist, Atrophie Blanche, Cyanosis, Ecchymosis, Hemosiderin Staining, Mottled, Pallor, Rubor, Erythema. Periwound temperature was noted as No Abnormality. The periwound has tenderness on palpation. 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 Wyn Quaker to discuss this further. She will probably need to be taken to the OR for debridement and biopsies of this calciphylaxis problem. We will continue to use hydrogel with silver and appropriate local care. Patient is still grieving and seems rather reluctant to do any surgical intervention. She says she will come back and see me next week. Plan Wound Cleansing: Wound #1 Right,Proximal Lower Leg: Clean wound with Normal Saline. SYRIA, KESTNER ([id number]) May Shower, gently pat wound dry prior to applying new dressing. Wound #2 Right,Distal Lower Leg: Clean wound with Normal Saline. May Shower, gently pat wound dry prior to applying new dressing. Wound #3 Left,Proximal,Posterior Lower Leg: Clean wound with Normal  Saline. May Shower, gently pat wound dry prior to applying new dressing. Wound #4 Left,Distal,Posterior Lower Leg: Clean wound with Normal Saline. May Shower, gently pat wound dry prior to applying new dressing. Wound #5 Left,Proximal,Lateral Lower Leg: Clean wound with Normal Saline. May Shower, gently pat wound dry prior to applying new dressing. Wound #6 Left,Distal,Lateral Lower Leg: Clean wound with Normal Saline. May Shower, gently pat wound dry prior to applying new dressing. Anesthetic: Wound #1  Right,Proximal Lower Leg: Topical Lidocaine 4% cream applied to wound bed prior to debridement Wound #2 Right,Distal Lower Leg: Topical Lidocaine 4% cream applied to wound bed prior to debridement Wound #3 Left,Proximal,Posterior Lower Leg: Topical Lidocaine 4% cream applied to wound bed prior to debridement Wound #4 Left,Distal,Posterior Lower Leg: Topical Lidocaine 4% cream applied to wound bed prior to debridement Wound #5 Left,Proximal,Lateral Lower Leg: Topical Lidocaine 4% cream applied to wound bed prior to debridement Wound #6 Left,Distal,Lateral Lower Leg: Topical Lidocaine 4% cream applied to wound bed prior to debridement Primary Wound Dressing: Wound #1 Right,Proximal Lower Leg: Hydrogel - with Ag Wound #2 Right,Distal Lower Leg: Hydrogel - with Ag Wound #3 Left,Proximal,Posterior Lower Leg: Hydrogel - with Ag Wound #4 Left,Distal,Posterior Lower Leg: Hydrogel - with Ag Wound #5 Left,Proximal,Lateral Lower Leg: Hydrogel - with Ag Wound #6 Left,Distal,Lateral Lower Leg: Hydrogel - with Ag Secondary Dressing: Wound #1 Right,Proximal Lower Leg: Gauze, ABD and Kerlix/Conform Wound #2 Right,Distal Lower Leg: Gauze, ABD and Kerlix/Conform Wound #3 Left,Proximal,Posterior Lower Leg: Gauze, ABD and Kerlix/Conform Wound #4 Left,Distal,Posterior Lower Leg: Gauze, ABD and Kerlix/Conform [name], Johnnay H. ([id number]) Wound #5 Left,Proximal,Lateral Lower Leg: Gauze, ABD and Kerlix/Conform Wound #6 Left,Distal,Lateral Lower Leg: Gauze, ABD and Kerlix/Conform Dressing Change Frequency: Wound #1 Right,Proximal Lower Leg: Change dressing every day. Wound #2 Right,Distal Lower Leg: Change dressing every day. Wound #3 Left,Proximal,Posterior Lower Leg: Change dressing every day. Wound #4 Left,Distal,Posterior Lower Leg: Change dressing every day. Wound #5 Left,Proximal,Lateral Lower Leg: Change dressing every day. Wound #6 Left,Distal,Lateral Lower Leg: Change dressing  every day. Follow-up Appointments: Wound #1 Right,Proximal Lower Leg: Return Appointment in 1 week. Wound #2 Right,Distal Lower Leg: Return Appointment in 1 week. Wound #3 Left,Proximal,Posterior Lower Leg: Return Appointment in 1 week. Wound #4 Left,Distal,Posterior Lower Leg: Return Appointment in 1 week. Wound #5 Left,Proximal,Lateral Lower Leg: Return Appointment in 1 week. Wound #6 Left,Distal,Lateral Lower Leg: Return Appointment in 1 week. Edema Control: Wound #1 Right,Proximal Lower Leg: Tubigrip Wound #2 Right,Distal Lower Leg: Tubigrip Wound #3 Left,Proximal,Posterior Lower Leg: Tubigrip Wound #4 Left,Distal,Posterior Lower Leg: Tubigrip Wound #5 Left,Proximal,Lateral Lower Leg: Tubigrip Wound #6 Left,Distal,Lateral Lower Leg: Tubigrip Home Health: Wound #1 Right,Proximal Lower Leg: Initiate Home Health for Skilled Nursing Home Health Nurse may visit PRN to address patient s wound care needs. FACE TO FACE ENCOUNTER: MEDICARE and MEDICAID PATIENTS: I certify that this patient is under my care and that I had a face-to-face encounter that meets the physician face-to-face encounter requirements with this patient on this date. The encounter with the patient was in whole or in part for the following MEDICAL CONDITION: (primary reason for Home Healthcare) MEDICAL NECESSITY: I certify, Byrd, Leigh H. ([id number]) that based on my findings, NURSING services are a medically necessary home health service. HOME BOUND STATUS: I certify that my clinical findings support that this patient is homebound (i.e., Due to illness or injury, pt requires aid of supportive devices such as crutches, cane, wheelchairs, walkers, the use of special transportation or the assistance of another person to leave  their place of residence. There is a normal inability to leave the home and doing so requires considerable and taxing effort. Other absences are for medical reasons / religious services  and are infrequent or of short duration when for other reasons). If current dressing causes regression in wound condition, may D/C ordered dressing product/s and apply Normal Saline Moist Dressing daily until next Wound Healing Center / Other MD appointment. Notify Wound Healing Center of regression in wound condition at [phone]. Please direct any NON-WOUND related issues/requests for orders to patient's Primary Care Physician Wound #2 Right,Distal Lower Leg: Initiate Home Health for Skilled Nursing Home Health Nurse may visit PRN to address patient s wound care needs. FACE TO FACE ENCOUNTER: MEDICARE and MEDICAID PATIENTS: I certify that this patient is under my care and that I had a face-to-face encounter that meets the physician face-to-face encounter requirements with this patient on this date. The encounter with the patient was in whole or in part for the following MEDICAL CONDITION: (primary reason for Home Healthcare) MEDICAL NECESSITY: I certify, that based on my findings, NURSING services are a medically necessary home health service. HOME BOUND STATUS: I certify that my clinical findings support that this patient is homebound (i.e., Due to illness or injury, pt requires aid of supportive devices such as crutches, cane, wheelchairs, walkers, the use of special transportation or the assistance of another person to leave their place of residence. There is a normal inability to leave the home and doing so requires considerable and taxing effort. Other absences are for medical reasons / religious services and are infrequent or of short duration when for other reasons). If current dressing causes regression in wound condition, may D/C ordered dressing product/s and apply Normal Saline Moist Dressing daily until next Wound Healing Center / Other MD appointment. Notify Wound Healing Center of regression in wound condition at [phone]. Please direct any NON-WOUND related  issues/requests for orders to patient's Primary Care Physician Wound #3 Left,Proximal,Posterior Lower Leg: Initiate Home Health for Skilled Nursing Home Health Nurse may visit PRN to address patient s wound care needs. FACE TO FACE ENCOUNTER: MEDICARE and MEDICAID PATIENTS: I certify that this patient is under my care and that I had a face-to-face encounter that meets the physician face-to-face encounter requirements with this patient on this date. The encounter with the patient was in whole or in part for the following MEDICAL CONDITION: (primary reason for Home Healthcare) MEDICAL NECESSITY: I certify, that based on my findings, NURSING services are a medically necessary home health service. HOME BOUND STATUS: I certify that my clinical findings support that this patient is homebound (i.e., Due to illness or injury, pt requires aid of supportive devices such as crutches, cane, wheelchairs, walkers, the use of special transportation or the assistance of another person to leave their place of residence. There is a normal inability to leave the home and doing so requires considerable and taxing effort. Other absences are for medical reasons / religious services and are infrequent or of short duration when for other reasons). If current dressing causes regression in wound condition, may D/C ordered dressing product/s and apply Normal Saline Moist Dressing daily until next Wound Healing Center / Other MD appointment. Notify Wound Healing Center of regression in wound condition at [phone]. Please direct any NON-WOUND related issues/requests for orders to patient's Primary Care Physician Wound #4 Left,Distal,Posterior Lower Leg: Initiate Home Health for Skilled Nursing Home Health Nurse may visit PRN to address patient s wound care needs.  FACE TO FACE ENCOUNTER: MEDICARE and MEDICAID PATIENTS: I certify that this patient is under my care and that I had a face-to-face encounter that meets the  physician face-to-face encounter requirements with this patient on this date. The encounter with the patient was in whole or in part for the following MEDICAL CONDITION: (primary reason for Home Healthcare) MEDICAL NECESSITY: I certify, Kustra, Gizelle H. ([id number]) that based on my findings, NURSING services are a medically necessary home health service. HOME BOUND STATUS: I certify that my clinical findings support that this patient is homebound (i.e., Due to illness or injury, pt requires aid of supportive devices such as crutches, cane, wheelchairs, walkers, the use of special transportation or the assistance of another person to leave their place of residence. There is a normal inability to leave the home and doing so requires considerable and taxing effort. Other absences are for medical reasons / religious services and are infrequent or of short duration when for other reasons). If current dressing causes regression in wound condition, may D/C ordered dressing product/s and apply Normal Saline Moist Dressing daily until next Wound Healing Center / Other MD appointment. Notify Wound Healing Center of regression in wound condition at [phone]. Please direct any NON-WOUND related issues/requests for orders to patient's Primary Care Physician Wound #5 Left,Proximal,Lateral Lower Leg: Initiate Home Health for Skilled Nursing Home Health Nurse may visit PRN to address patient s wound care needs. FACE TO FACE ENCOUNTER: MEDICARE and MEDICAID PATIENTS: I certify that this patient is under my care and that I had a face-to-face encounter that meets the physician face-to-face encounter requirements with this patient on this date. The encounter with the patient was in whole or in part for the following MEDICAL CONDITION: (primary reason for Home Healthcare) MEDICAL NECESSITY: I certify, that based on my findings, NURSING services are a medically necessary home health service. HOME BOUND  STATUS: I certify that my clinical findings support that this patient is homebound (i.e., Due to illness or injury, pt requires aid of supportive devices such as crutches, cane, wheelchairs, walkers, the use of special transportation or the assistance of another person to leave their place of residence. There is a normal inability to leave the home and doing so requires considerable and taxing effort. Other absences are for medical reasons / religious services and are infrequent or of short duration when for other reasons). If current dressing causes regression in wound condition, may D/C ordered dressing product/s and apply Normal Saline Moist Dressing daily until next Wound Healing Center / Other MD appointment. Notify Wound Healing Center of regression in wound condition at [phone]. Please direct any NON-WOUND related issues/requests for orders to patient's Primary Care Physician Wound #6 Left,Distal,Lateral Lower Leg: Initiate Home Health for Skilled Nursing Home Health Nurse may visit PRN to address patient s wound care needs. FACE TO FACE ENCOUNTER: MEDICARE and MEDICAID PATIENTS: I certify that this patient is under my care and that I had a face-to-face encounter that meets the physician face-to-face encounter requirements with this patient on this date. The encounter with the patient was in whole or in part for the following MEDICAL CONDITION: (primary reason for Home Healthcare) MEDICAL NECESSITY: I certify, that based on my findings, NURSING services are a medically necessary home health service. HOME BOUND STATUS: I certify that my clinical findings support that this patient is homebound (i.e., Due to illness or injury, pt requires aid of supportive devices such as crutches, cane, wheelchairs, walkers, the use of special transportation  or the assistance of another person to leave their place of residence. There is a normal inability to leave the home and doing so requires  considerable and taxing effort. Other absences are for medical reasons / religious services and are infrequent or of short duration when for other reasons). If current dressing causes regression in wound condition, may D/C ordered dressing product/s and apply Normal Saline Moist Dressing daily until next Wound Healing Center / Other MD appointment. Notify Wound Healing Center of regression in wound condition at [phone]. Please direct any NON-WOUND related issues/requests for orders to patient's Primary Care Physician AMARRAH, MEINHART ([id number]) The patient has had no further workup or referral since my last visit and I asked her to call both her PCP and Dr. Wyn Quaker to discuss this further. She will probably need to be taken to the OR for debridement and biopsies of this calciphylaxis problem. We will continue to use hydrogel with silver and appropriate local care. Patient is still grieving and seems rather reluctant to do any surgical intervention. She says she will come back and see me next week. Electronic Signature(s) Signed: 02/07/2015 10:19:55 AM By: Evlyn Kanner MD, FACS Entered By: Evlyn Kanner on 02/07/2015 10:19:54 Bisaillon, [name] ([id number]) -------------------------------------------------------------------------------- SuperBill Details Patient Name: [name], Latajah H. Date of Service: 02/07/2015 Medical Record Number: [id number] Patient Account Number: [id number] Date of Birth/Sex: [birth year] (59 y.o. Female) Treating RN: Primary Care Physician: Fidel Levy Other Clinician: Referring Physician: Fidel Levy Treating Physician/Extender: Rudene Re in Treatment: 1 Diagnosis Coding ICD-10 Codes Code Description E11.622 Type 2 diabetes mellitus with other skin ulcer N18.4 Chronic kidney disease, stage 4 (severe) L97.222 Non-pressure chronic ulcer of left calf with fat layer exposed L97.212 Non-pressure chronic ulcer of right calf with fat layer  exposed Facility Procedures CPT4 Code: [id number] Description: [phone] - WOUND CARE VISIT-LEV 5 EST PT Modifier: Quantity: 1 Physician Procedures CPT4 Code: [id number] Description: 99213 - WC PHYS LEVEL 3 - EST PT ICD-10 Description Diagnosis E11.622 Type 2 diabetes mellitus with other skin ulcer N18.4 Chronic kidney disease, stage 4 (severe) L97.222 Non-pressure chronic ulcer of left calf with fat L97.212 Non-pressure  chronic ulcer of right calf with fat Modifier: layer exposed layer exposed Quantity: 1 Electronic Signature(s) Signed: 02/07/2015 10:20:13 AM By: Evlyn Kanner MD, FACS Entered By: Evlyn Kanner on 02/07/2015 10:20:13

## 2015-02-10 ENCOUNTER — Encounter
Admission: RE | Disposition: A | Discharge status: Home / Self Care | Payer: Self-pay | Source: Ambulatory Visit | Attending: Vascular Surgery

## 2015-02-10 ENCOUNTER — Encounter: Payer: Self-pay | Admitting: *Deleted

## 2015-02-10 ENCOUNTER — Ambulatory Visit
Admission: RE | Admit: 2015-02-10 | Discharge: 2015-02-10 | Disposition: A | Discharge status: Home / Self Care | Payer: BC Managed Care – PPO | Source: Ambulatory Visit | Attending: Vascular Surgery | Admitting: Vascular Surgery

## 2015-02-10 ENCOUNTER — Other Ambulatory Visit
Admission: RE | Admit: 2015-02-10 | Discharge: 2015-02-10 | Disposition: A | Discharge status: Home / Self Care | Payer: BC Managed Care – PPO | Source: Ambulatory Visit | Attending: Nephrology | Admitting: Nephrology

## 2015-02-10 DIAGNOSIS — T82898A Other specified complication of vascular prosthetic devices, implants and grafts, initial encounter: Secondary | ICD-10-CM | POA: Insufficient documentation

## 2015-02-10 DIAGNOSIS — E1122 Type 2 diabetes mellitus with diabetic chronic kidney disease: Secondary | ICD-10-CM | POA: Insufficient documentation

## 2015-02-10 DIAGNOSIS — Z79899 Other long term (current) drug therapy: Secondary | ICD-10-CM | POA: Insufficient documentation

## 2015-02-10 DIAGNOSIS — N186 End stage renal disease: Secondary | ICD-10-CM | POA: Diagnosis not present

## 2015-02-10 DIAGNOSIS — E669 Obesity, unspecified: Secondary | ICD-10-CM | POA: Diagnosis not present

## 2015-02-10 DIAGNOSIS — Z992 Dependence on renal dialysis: Secondary | ICD-10-CM | POA: Insufficient documentation

## 2015-02-10 HISTORY — PX: PERIPHERAL VASCULAR CATHETERIZATION: SHX172C

## 2015-02-10 LAB — POTASSIUM: Potassium: 4.1 mmol/L (ref 3.5–5.1)

## 2015-02-10 SURGERY — DIALYSIS/PERMA CATHETER INSERTION
Anesthesia: Moderate Sedation

## 2015-02-10 MED ORDER — HEPARIN SODIUM (PORCINE) 1000 UNIT/ML IJ SOLN
INTRAMUSCULAR | Status: AC
Start: 2015-02-10 — End: 2015-02-10
  Filled 2015-02-10: qty 1

## 2015-02-10 MED ORDER — HEPARIN SODIUM (PORCINE) 10000 UNIT/ML IJ SOLN
INTRAMUSCULAR | Status: AC
  Filled 2015-02-10: qty 1

## 2015-02-10 MED ORDER — MIDAZOLAM HCL 5 MG/5ML IJ SOLN
INTRAMUSCULAR | Status: AC
  Filled 2015-02-10: qty 5

## 2015-02-10 MED ORDER — METOPROLOL TARTRATE 1 MG/ML IV SOLN: 2.0000 mg | INTRAVENOUS | Status: DC | PRN

## 2015-02-10 MED ORDER — HYDRALAZINE HCL 20 MG/ML IJ SOLN: 5.0000 mg | INTRAMUSCULAR | Status: DC | PRN

## 2015-02-10 MED ORDER — MIDAZOLAM HCL 2 MG/2ML IJ SOLN
INTRAMUSCULAR | Status: DC | PRN
  Administered 2015-02-10 (×2): 2 mg via INTRAVENOUS

## 2015-02-10 MED ORDER — FENTANYL CITRATE (PF) 100 MCG/2ML IJ SOLN
INTRAMUSCULAR | Status: AC
  Filled 2015-02-10: qty 2

## 2015-02-10 MED ORDER — SODIUM CHLORIDE 0.9 % IV SOLN: 500.0000 mL | Freq: Once | INTRAVENOUS | Status: DC | PRN

## 2015-02-10 MED ORDER — MORPHINE SULFATE 4 MG/ML IJ SOLN: 2.0000 mg | INTRAMUSCULAR | Status: DC | PRN

## 2015-02-10 MED ORDER — ACETAMINOPHEN 325 MG PO TABS
325.0000 mg | ORAL_TABLET | ORAL | Status: DC | PRN
Start: 2015-02-10 — End: 2015-02-10

## 2015-02-10 MED ORDER — FENTANYL CITRATE (PF) 100 MCG/2ML IJ SOLN
INTRAMUSCULAR | Status: DC | PRN
  Administered 2015-02-10 (×2): 50 ug via INTRAVENOUS

## 2015-02-10 MED ORDER — ACETAMINOPHEN 325 MG RE SUPP
325.0000 mg | RECTAL | Status: DC | PRN
Start: 2015-02-10 — End: 2015-02-10

## 2015-02-10 MED ORDER — LABETALOL HCL 5 MG/ML IV SOLN: 10.0000 mg | INTRAVENOUS | Status: DC | PRN

## 2015-02-10 MED ORDER — ONDANSETRON HCL 4 MG/2ML IJ SOLN: 4.0000 mg | Freq: Four times a day (QID) | INTRAMUSCULAR | Status: DC | PRN

## 2015-02-10 MED ORDER — PHENOL 1.4 % MT LIQD
1.0000 | OROMUCOSAL | Status: DC | PRN
Start: 2015-02-10 — End: 2015-02-10

## 2015-02-10 MED ORDER — SODIUM CHLORIDE 0.9 % IV SOLN
INTRAVENOUS | Status: DC
  Administered 2015-02-10 (×2): via INTRAVENOUS

## 2015-02-10 MED ORDER — GUAIFENESIN-DM 100-10 MG/5ML PO SYRP: 15.0000 mL | ORAL_SOLUTION | ORAL | Status: DC | PRN

## 2015-02-10 MED ORDER — CEFAZOLIN SODIUM 1-5 GM-% IV SOLN: 1.0000 g | Freq: Once | INTRAVENOUS | Status: DC

## 2015-02-10 MED ORDER — OXYCODONE-ACETAMINOPHEN 5-325 MG PO TABS: 1.0000 | ORAL_TABLET | ORAL | Status: DC | PRN

## 2015-02-10 MED ORDER — ALUM & MAG HYDROXIDE-SIMETH 200-200-20 MG/5ML PO SUSP: 15.0000 mL | ORAL | Status: DC | PRN

## 2015-02-10 SURGICAL SUPPLY — 7 items
CATH PALINDROME RT-P 15FX19CM (CATHETERS) ×3 IMPLANT
DERMABOND ADVANCED (GAUZE/BANDAGES/DRESSINGS) ×2
DERMABOND ADVANCED .7 DNX12 (GAUZE/BANDAGES/DRESSINGS) ×1 IMPLANT
PACK ANGIOGRAPHY (CUSTOM PROCEDURE TRAY) ×3 IMPLANT
SUT MNCRL AB 4-0 PS2 18 (SUTURE) ×3 IMPLANT
SUT PROLENE 0 CT 1 30 (SUTURE) ×3 IMPLANT
TOWEL OR 17X26 4PK STRL BLUE (TOWEL DISPOSABLE) ×3 IMPLANT

## 2015-02-10 NOTE — Discharge Instructions (Signed)
Tunneled Catheter Insertion, Care After °Refer to this sheet in the next few weeks. These instructions provide you with information on caring for yourself after your procedure. Your caregiver may also give you more specific instructions. Your treatment has been planned according to current medical practices, but problems sometimes occur. Call your caregiver if you have any problems or questions after your procedure.  °HOME CARE INSTRUCTIONS °· Rest at home the day of the procedure. You will likely be able to return to normal activities the following day. °· Follow your caregiver's specific instructions for the type of device that you have. °· Only take over-the-counter or prescription medicines as directed by your caregiver. °· Keep the insertion site of the catheter clean and dry at all times. °¨ Change the bandages (dressings) over the catheter site as directed by your caregiver. °¨ Wash the area around the catheter site during each dressing change. Sponge bathe the area using a germ-killing (antiseptic) solution as directed by your caregiver. °¨ Look for redness or swelling at the insertion site during each dressing change. °· Apply an antibiotic ointment as directed by your caregiver. °· Flush your catheter as directed to keep it from becoming clogged. °· Always wash your hands thoroughly before changing dressings or flushing the catheter. °· Do not let air enter the catheter. °¨ Never open the cap at the catheter tip. °¨ Always make sure there is no air in the syringe or in the tubing for infusions.    °· Do not lift anything heavy. °· Do not drive until your caregiver approves. °· Do not shower or bathe until your caregiver approves. When you shower or bathe, place a piece of plastic wrap over the catheter site. Do not allow the catheter site or the dressing to get wet. If taking a bath, do not allow the catheter to get submerged in the water. °If the catheter was inserted through an arm vein:  °· Avoid  wearing tight clothes or jewelry on the arm that has the catheter.   °· Do not sleep with your head on the arm that has the catheter.   °· Do not allow use of a blood pressure cuff on the arm that has the catheter.   °· Do not let anyone draw blood from the arm that has the catheter, except through the catheter itself. °SEEK MEDICAL CARE IF: °· You have bleeding at the insertion site of the catheter.   °· You feel weak or nauseous.   °· Your catheter is not working properly.   °· You have redness, pain, swelling, and warmth at the insertion site.   °· You notice fluid draining from the insertion site.   °SEEK IMMEDIATE MEDICAL CARE IF: °· Your catheter breaks or has a hole in it.   °· Your catheter comes loose or gets pulled completely out. If this happens, hold firm pressure over the area with your hand or a clean cloth.   °· You have a fever. °· You have chills.   °· Your catheter becomes totally blocked.   °· You have swelling in your arm, shoulder, neck, or face.   °· You have bleeding from the insertion site that does not stop.   °· You develop chest pain or have trouble breathing.   °· You feel dizzy or faint.   °MAKE SURE YOU: °· Understand these instructions. °· Will watch your condition. °· Will get help right away if you are not doing well or get worse. °Document Released: 06/17/2012 Document Revised: 03/03/2013 Document Reviewed: 06/17/2012 °[employer]® Patient Information ©2015 [employer]. This information is not intended to replace advice   given to you by your health care provider. Make sure you discuss any questions you have with your health care provider. ° °

## 2015-02-10 NOTE — H&P (Signed)
[facility] History & Physical Update  The patient was interviewed and re-examined.  The patient's previous History and Physical has been reviewed and is unchanged.  She declined our recommended fistulagram at her last clinic visit earlier this month and now her access in not working and her dialysis center has requested a Permcath. We plan to proceed with the scheduled procedure of a Permcath.  Shaneequa Bahner, MD  02/10/2015, 1:53 PM

## 2015-02-10 NOTE — CV Procedure (Signed)
OPERATIVE NOTE    PRE-OPERATIVE DIAGNOSIS: 1. ESRD 2. Non-functional AVF  POST-OPERATIVE DIAGNOSIS: same as above  PROCEDURE: 1. Ultrasound guidance for vascular access to the right internal jugular vein 2. Fluoroscopic guidance for placement of catheter 3. Placement of a 19 cm tip to cuff tunneled hemodialysis catheter via the right internal jugular vein  SURGEON: Festus Barren, MD  ANESTHESIA:  Local/MCS  ESTIMATED BLOOD LOSS: minimal  FINDING(S): 1.  Patent right internal jugular vein  SPECIMEN(S):  None  INDICATIONS:   [name] is a 59 y.o. female who presents with ESRD and a non-functional AV access.  The patient needs long term dialysis access for their ESRD, and a Permcath is necessary.  Risks and benefits are discussed and informed consent is obtained.    DESCRIPTION: After obtaining full informed written consent, the patient was brought back to the vascular suited. The patient's right neck and chest were sterilely prepped and draped in a sterile surgical field was created.  The right internal jugular vein was visualized with ultrasound and found to be patent. It was then accessed under direct ultrasound guidance and a permanent image was recorded. A wire was placed. After skin nick and dilatation, the peel-away sheath was placed over the wire. I then turned my attention to an area under the clavicle. Approximately 1-2 fingerbreadths below the clavicle a small counterincision was created and tunneled from the subclavicular incision to the access site. Using fluoroscopic guidance, a 19 centimeter tip to cuff tunneled hemodialysis catheter was selected, and tunneled from the subclavicular incision to the access site. It was then placed through the peel-away sheath and the peel-away sheath was removed. Using fluoroscopic guidance the catheter tips were parked in the right atrium. The appropriate distal connectors were placed. It withdrew blood well and flushed easily with  heparinized saline and a concentrated heparin solution was then placed. It was secured to the chest wall with 2 Prolene sutures. The access incision was closed single 4-0 Monocryl. A 4-0 Monocryl pursestring suture was placed around the exit site. Sterile dressings were placed. The patient tolerated the procedure well and was taken to the recovery room in stable condition.  COMPLICATIONS: None  CONDITION: Stable  DEW,JASON  02/10/2015, 2:22 PM

## 2015-02-13 ENCOUNTER — Encounter: Payer: Self-pay | Admitting: Vascular Surgery

## 2015-02-14 ENCOUNTER — Encounter: Payer: BC Managed Care – PPO | Attending: Surgery | Admitting: Surgery

## 2015-02-14 DIAGNOSIS — Z992 Dependence on renal dialysis: Secondary | ICD-10-CM | POA: Insufficient documentation

## 2015-02-14 DIAGNOSIS — L97212 Non-pressure chronic ulcer of right calf with fat layer exposed: Secondary | ICD-10-CM | POA: Diagnosis not present

## 2015-02-14 DIAGNOSIS — L97222 Non-pressure chronic ulcer of left calf with fat layer exposed: Secondary | ICD-10-CM | POA: Insufficient documentation

## 2015-02-14 DIAGNOSIS — R600 Localized edema: Secondary | ICD-10-CM | POA: Insufficient documentation

## 2015-02-14 DIAGNOSIS — N184 Chronic kidney disease, stage 4 (severe): Secondary | ICD-10-CM | POA: Insufficient documentation

## 2015-02-14 DIAGNOSIS — E11622 Type 2 diabetes mellitus with other skin ulcer: Secondary | ICD-10-CM | POA: Insufficient documentation

## 2015-02-14 DIAGNOSIS — I10 Essential (primary) hypertension: Secondary | ICD-10-CM | POA: Insufficient documentation

## 2015-02-15 NOTE — Progress Notes (Addendum)
MAITLAND, MUHLBAUER ([id number]) Visit Report for 02/14/2015 Chief Complaint Document Details Patient Name: [name]. Date of Service: 02/14/2015 1:45 PM Medical Record Number: [id number] Patient Account Number: [id number] Date of Birth/Sex: [birth year] (59 y.o. Female) Treating RN: Primary Care Physician: Fidel Levy Other Clinician: Referring Physician: Fidel Levy Treating Physician/Extender: Rudene Re in Treatment: 2 Information Obtained from: Patient Chief Complaint Patient presents to the wound care center for a consult due non healing wound. This 59 year old patient has had ulcerated areas on the left lower extremity more than the right lower extremity to very painful for the last 3 weeks. Electronic Signature(s) Signed: 02/14/2015 2:40:36 PM By: Evlyn Kanner MD, FACS Entered By: Evlyn Kanner on 02/14/2015 14:40:36 [name] ([id number]) -------------------------------------------------------------------------------- Debridement Details Patient Name: [name], [name] Date of Service: 02/14/2015 1:45 PM Medical Record Number: [id number] Patient Account Number: [id number] Date of Birth/Sex: 1956/05/21 (59 y.o. Female) Treating RN: Primary Care Physician: Fidel Levy Other Clinician: Referring Physician: Fidel Levy Treating Physician/Extender: Rudene Re in Treatment: 2 Debridement Performed for Wound #3 Left,Proximal,Posterior Lower Leg Assessment: Performed By: Physician Tristan Schroeder., MD Debridement: Debridement Pre-procedure Yes Verification/Time Out Taken: Start Time: 14:29 Pain Control: Lidocaine 4% Topical Solution Level: Skin/Subcutaneous Tissue Total Area Debrided (L x 3.7 (cm) x 4 (cm) = 14.8 (cm) W): Tissue and other Viable, Non-Viable, Eschar, Fibrin/Slough, Subcutaneous material debrided: Instrument: Forceps, Scissors Bleeding: Minimum Hemostasis Achieved: Pressure End Time:  14:31 Procedural Pain: 0 Post Procedural Pain: 8 Response to Treatment: Procedure was tolerated well Post Debridement Measurements of Total Wound Length: (cm) 3.7 Width: (cm) 4 Depth: (cm) 0.4 Volume: (cm) 4.65 Electronic Signature(s) Signed: 02/14/2015 2:40:20 PM By: Evlyn Kanner MD, FACS Entered By: Evlyn Kanner on 02/14/2015 14:40:20 [name] ([id number]) -------------------------------------------------------------------------------- Debridement Details Patient Name: [name], [name] Date of Service: 02/14/2015 1:45 PM Medical Record Number: [id number] Patient Account Number: [id number] Date of Birth/Sex: 1955/10/01 (59 y.o. Female) Treating RN: Primary Care Physician: Fidel Levy Other Clinician: Referring Physician: Fidel Levy Treating Physician/Extender: Rudene Re in Treatment: 2 Debridement Performed for Wound #5 Left,Proximal,Lateral Lower Leg Assessment: Performed By: Physician Tristan Schroeder., MD Debridement: Debridement Pre-procedure Yes Verification/Time Out Taken: Start Time: 14:27 Pain Control: Lidocaine 4% Topical Solution Level: Skin/Subcutaneous Tissue Total Area Debrided (L x 4.3 (cm) x 6.5 (cm) = 27.95 (cm) W): Tissue and other Viable, Non-Viable, Eschar, Fibrin/Slough, Subcutaneous material debrided: Instrument: Forceps Bleeding: None End Time: 14:28 Procedural Pain: 0 Post Procedural Pain: 0 Response to Treatment: Procedure was tolerated well Post Debridement Measurements of Total Wound Length: (cm) 4.3 Width: (cm) 6.5 Depth: (cm) 0.1 Volume: (cm) 2.195 Electronic Signature(s) Signed: 02/14/2015 2:40:30 PM By: Evlyn Kanner MD, FACS Entered By: Evlyn Kanner on 02/14/2015 14:40:29 [name], [name] ([id number]) -------------------------------------------------------------------------------- HPI Details Patient Name: [name], [name] Date of Service: 02/14/2015 1:45 PM Medical Record Number:  [id number] Patient Account Number: [id number] Date of Birth/Sex: 10-Aug-1955 (59 y.o. Female) Treating RN: Primary Care Physician: Fidel Levy Other Clinician: Referring Physician: Fidel Levy Treating Physician/Extender: Rudene Re in Treatment: 2 History of Present Illness Location: Painful ulcerated areas left lower extremity more than right lower extremity Quality: Patient reports experiencing a sharp pain to affected area(s). Severity: Patient states wound are getting worse. Duration: Patient has had the wound for < 4 weeks prior to presenting for treatment Timing: Pain in wound is Intermittent (comes and goes Context: The wound appeared gradually over time Modifying Factors: Other treatment(s) tried  include: she has had Keflex for about 10 days. Associated Signs and Symptoms: Patient reports having difficulty standing for long periods. HPI Description: 59 year old patient recently seen by her PCP Dr. Venora Maples who saw her on 01/25/2015 4 ulcers both lower extremities which have been present for about 3 weeks. She took a ten-day course of Keflex and continues to have leg pain and swelling. Past medical history significant for chronic kidney disease, hypertension and diabetes mellitus without complications. She has never been a smoker. Mostly recent CBC was within normal limits, BMP showed that her glucose was 136 and a beard was 45 and a creatinine is 4.19. The patient has had chronic renal failure and had a left AV fistula placed by Dr. Wyn Quaker in May and she is getting ready for hemodialysis soon. She has not been put on any specific medications for this problem. Addendum: tried to get Dr. 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 all questions answered. 02/07/2015 -- the patient started hemodialysis  yesterday but was not successful and is going to be seeing Dr. Wyn Quaker regain. She also has a vascular workup for early August. No further investigations or referrals have been made after my conversation with her PCPs office last week. I have asked the patient to call her PCPs office and discuss further investigations and referrals. 02/14/2015 -- she has had 2 sessions of hemodialysis via the right IJ dialysis catheter. She has not seen her PCP or Dr. Wyn Quaker yet. Part of a vascular workup was done today and the next is sometime later in the month. Unfortunately she does not agree to any surgical intervention at this stage and I have not been able to refer her to a surgeon for debridement in the OR. Electronic Signature(s) Signed: 02/14/2015 2:41:48 PM By: Evlyn Kanner MD, FACS Entered By: Evlyn Kanner on 02/14/2015 14:41:48 [name] ([id number]) -------------------------------------------------------------------------------- Physical Exam Details Patient Name: [name], [name] Date of Service: 02/14/2015 1:45 PM Medical Record Number: [id number] Patient Account Number: [id number] Date of Birth/Sex: April 30, 1956 (59 y.o. Female) Treating RN: Primary Care Physician: Fidel Levy Other Clinician: Referring Physician: Fidel Levy Treating Physician/Extender: Rudene Re in Treatment: 2 Constitutional . Pulse regular. Respirations normal and unlabored. Afebrile. . Eyes Nonicteric. Reactive to light. Ears, Nose, Mouth, and Throat Lips, teeth, and gums WNL.Marland Kitchen Moist mucosa without lesions . Neck supple and nontender. No palpable supraclavicular or cervical adenopathy. Normal sized without goiter. Respiratory WNL. No retractions.. Breath sounds WNL, No rubs, rales, rhonchi, or wheeze.. Cardiovascular Heart rhythm and rate regular, no murmur or gallop.. Pedal Pulses WNL. No clubbing, cyanosis or edema. Chest Breasts symmetical and no nipple discharge.. Breast tissue WNL,  no masses, lumps, or tenderness.. Lymphatic No adneopathy. No adenopathy. No adenopathy. Musculoskeletal Adexa without tenderness or enlargement.. Digits and nails w/o clubbing, cyanosis, infection, petechiae, ischemia, or inflammatory conditions.. Integumentary (Hair, Skin) No suspicious lesions. No crepitus or fluctuance. No peri-wound warmth or erythema. No masses.Marland Kitchen Psychiatric Judgement and insight Intact.. No evidence of depression, anxiety, or agitation.. Notes she continues to have a lot of dry eschar and some of it has been removed with sharp dissection with forceps and scissors. Electronic Signature(s) Signed: 02/14/2015 2:42:19 PM By: Evlyn Kanner MD, FACS Entered By: Evlyn Kanner on 02/14/2015 14:42:19 Balandran, [name] ([id number]) -------------------------------------------------------------------------------- Physician Orders Details Patient  Name: VENESHA, PETRAITIS. Date of Service: 02/14/2015 1:45 PM Medical Record Number: [id number] Patient Account Number: [id number] Date of Birth/Sex: 1955-08-17 (59 y.o. Female) Treating RN: Curtis Sites Primary Care Physician: Fidel Levy Other Clinician: Referring Physician: Fidel Levy Treating Physician/Extender: Rudene Re in Treatment: 2 Verbal / Phone Orders: Yes Clinician: Curtis Sites Read Back and Verified: Yes Diagnosis Coding Wound Cleansing Wound #1 Right,Proximal Lower Leg o Clean wound with Normal Saline. o May Shower, gently pat wound dry prior to applying new dressing. Wound #2 Right,Distal Lower Leg o Clean wound with Normal Saline. o May Shower, gently pat wound dry prior to applying new dressing. Wound #3 Left,Proximal,Posterior Lower Leg o Clean wound with Normal Saline. o May Shower, gently pat wound dry prior to applying new dressing. Wound #4 Left,Distal,Posterior Lower Leg o Clean wound with Normal Saline. o May Shower, gently pat wound dry prior to applying  new dressing. Wound #5 Left,Proximal,Lateral Lower Leg o Clean wound with Normal Saline. o May Shower, gently pat wound dry prior to applying new dressing. Wound #6 Left,Distal,Lateral Lower Leg o Clean wound with Normal Saline. o May Shower, gently pat wound dry prior to applying new dressing. Anesthetic Wound #1 Right,Proximal Lower Leg o Topical Lidocaine 4% cream applied to wound bed prior to debridement Wound #2 Right,Distal Lower Leg o Topical Lidocaine 4% cream applied to wound bed prior to debridement Wound #3 Left,Proximal,Posterior Lower Leg o Topical Lidocaine 4% cream applied to wound bed prior to debridement Wound #4 Left,Distal,Posterior Lower Leg o Topical Lidocaine 4% cream applied to wound bed prior to debridement Schlemmer, Maressa H. ([id number]) Wound #5 Left,Proximal,Lateral Lower Leg o Topical Lidocaine 4% cream applied to wound bed prior to debridement Wound #6 Left,Distal,Lateral Lower Leg o Topical Lidocaine 4% cream applied to wound bed prior to debridement Primary Wound Dressing Wound #1 Right,Proximal Lower Leg o Hydrogel - with Ag Wound #2 Right,Distal Lower Leg o Hydrogel - with Ag Wound #3 Left,Proximal,Posterior Lower Leg o Hydrogel - with Ag Wound #4 Left,Distal,Posterior Lower Leg o Hydrogel - with Ag Wound #5 Left,Proximal,Lateral Lower Leg o Hydrogel - with Ag Wound #6 Left,Distal,Lateral Lower Leg o Hydrogel - with Ag Secondary Dressing Wound #1 Right,Proximal Lower Leg o Gauze, ABD and Kerlix/Conform Wound #2 Right,Distal Lower Leg o Gauze, ABD and Kerlix/Conform Wound #3 Left,Proximal,Posterior Lower Leg o Gauze, ABD and Kerlix/Conform Wound #4 Left,Distal,Posterior Lower Leg o Gauze, ABD and Kerlix/Conform Wound #5 Left,Proximal,Lateral Lower Leg o Gauze, ABD and Kerlix/Conform Wound #6 Left,Distal,Lateral Lower Leg o Gauze, ABD and Kerlix/Conform Dressing Change Frequency Wound #1  Right,Proximal Lower Leg o Change dressing every day. [name], [name] ([id number]) Wound #2 Right,Distal Lower Leg o Change dressing every day. Wound #3 Left,Proximal,Posterior Lower Leg o Change dressing every day. Wound #4 Left,Distal,Posterior Lower Leg o Change dressing every day. Wound #5 Left,Proximal,Lateral Lower Leg o Change dressing every day. Wound #6 Left,Distal,Lateral Lower Leg o Change dressing every day. Follow-up Appointments Wound #1 Right,Proximal Lower Leg o Return Appointment in 1 week. Wound #2 Right,Distal Lower Leg o Return Appointment in 1 week. Wound #3 Left,Proximal,Posterior Lower Leg o Return Appointment in 1 week. Wound #4 Left,Distal,Posterior Lower Leg o Return Appointment in 1 week. Wound #5 Left,Proximal,Lateral Lower Leg o Return Appointment in 1 week. Wound #6 Left,Distal,Lateral Lower Leg o Return Appointment in 1 week. Edema Control Wound #1 Right,Proximal Lower Leg o Tubigrip Wound #2 Right,Distal Lower Leg o Tubigrip Wound #3 Left,Proximal,Posterior Lower Leg o Tubigrip Wound #4 Left,Distal,Posterior  Lower Leg o Tubigrip Wound #5 Left,Proximal,Lateral Lower Leg [name], [name] ([id number]) o Tubigrip Wound #6 Left,Distal,Lateral Lower Leg o Tubigrip Home Health Wound #1 Right,Proximal Lower Leg o Initiate Home Health for Skilled Nursing o Home Health Nurse may visit PRN to address patientos wound care needs. o FACE TO FACE ENCOUNTER: MEDICARE and MEDICAID PATIENTS: I certify that this patient is under my care and that I had a face-to-face encounter that meets the physician face-to-face encounter requirements with this patient on this date. The encounter with the patient was in whole or in part for the following MEDICAL CONDITION: (primary reason for Home Healthcare) MEDICAL NECESSITY: I certify, that based on my findings, NURSING services are a medically necessary home health  service. HOME BOUND STATUS: I certify that my clinical findings support that this patient is homebound (i.e., Due to illness or injury, pt requires aid of supportive devices such as crutches, cane, wheelchairs, walkers, the use of special transportation or the assistance of another person to leave their place of residence. There is a normal inability to leave the home and doing so requires considerable and taxing effort. Other absences are for medical reasons / religious services and are infrequent or of short duration when for other reasons). o If current dressing causes regression in wound condition, may D/C ordered dressing product/s and apply Normal Saline Moist Dressing daily until next Wound Healing Center / Other MD appointment. Notify Wound Healing Center of regression in wound condition at [phone]. o Please direct any NON-WOUND related issues/requests for orders to patient's Primary Care Physician Wound #2 Right,Distal Lower Leg o Initiate Home Health for Skilled Nursing o Home Health Nurse may visit PRN to address patientos wound care needs. o FACE TO FACE ENCOUNTER: MEDICARE and MEDICAID PATIENTS: I certify that this patient is under my care and that I had a face-to-face encounter that meets the physician face-to-face encounter requirements with this patient on this date. The encounter with the patient was in whole or in part for the following MEDICAL CONDITION: (primary reason for Home Healthcare) MEDICAL NECESSITY: I certify, that based on my findings, NURSING services are a medically necessary home health service. HOME BOUND STATUS: I certify that my clinical findings support that this patient is homebound (i.e., Due to illness or injury, pt requires aid of supportive devices such as crutches, cane, wheelchairs, walkers, the use of special transportation or the assistance of another person to leave their place of residence. There is a normal inability to leave the  home and doing so requires considerable and taxing effort. Other absences are for medical reasons / religious services and are infrequent or of short duration when for other reasons). o If current dressing causes regression in wound condition, may D/C ordered dressing product/s and apply Normal Saline Moist Dressing daily until next Wound Healing Center / Other MD appointment. Notify Wound Healing Center of regression in wound condition at [phone]. o Please direct any NON-WOUND related issues/requests for orders to patient's Primary Care Physician Wound #3 Left,Proximal,Posterior Lower Leg [name] [name] ([id number]) o Home Health Nurse may visit PRN to address patientos wound care needs. o FACE TO FACE ENCOUNTER: MEDICARE and MEDICAID PATIENTS: I certify that this patient is under my care and that I had a face-to-face encounter that meets the physician face-to-face encounter requirements with this patient on this date. The encounter with the patient was in whole or in part for the following MEDICAL CONDITION: (primary reason for  Home Healthcare) MEDICAL NECESSITY: I certify, that based on my findings, NURSING services are a medically necessary home health service. HOME BOUND STATUS: I certify that my clinical findings support that this patient is homebound (i.e., Due to illness or injury, pt requires aid of supportive devices such as crutches, cane, wheelchairs, walkers, the use of special transportation or the assistance of another person to leave their place of residence. There is a normal inability to leave the home and doing so requires considerable and taxing effort. Other absences are for medical reasons / religious services and are infrequent or of short duration when for other reasons). o If current dressing causes regression in wound condition, may D/C ordered dressing product/s and apply Normal Saline Moist Dressing  daily until next Wound Healing Center / Other MD appointment. Notify Wound Healing Center of regression in wound condition at [phone]. o Please direct any NON-WOUND related issues/requests for orders to patient's Primary Care Physician Wound #4 Left,Distal,Posterior Lower Leg o Initiate Home Health for Skilled Nursing o Home Health Nurse may visit PRN to address patientos wound care needs. o FACE TO FACE ENCOUNTER: MEDICARE and MEDICAID PATIENTS: I certify that this patient is under my care and that I had a face-to-face encounter that meets the physician face-to-face encounter requirements with this patient on this date. The encounter with the patient was in whole or in part for the following MEDICAL CONDITION: (primary reason for Home Healthcare) MEDICAL NECESSITY: I certify, that based on my findings, NURSING services are a medically necessary home health service. HOME BOUND STATUS: I certify that my clinical findings support that this patient is homebound (i.e., Due to illness or injury, pt requires aid of supportive devices such as crutches, cane, wheelchairs, walkers, the use of special transportation or the assistance of another person to leave their place of residence. There is a normal inability to leave the home and doing so requires considerable and taxing effort. Other absences are for medical reasons / religious services and are infrequent or of short duration when for other reasons). o If current dressing causes regression in wound condition, may D/C ordered dressing product/s and apply Normal Saline Moist Dressing daily until next Wound Healing Center / Other MD appointment. Notify Wound Healing Center of regression in wound condition at [phone]. o Please direct any NON-WOUND related issues/requests for orders to patient's Primary Care Physician Wound #5 Left,Proximal,Lateral Lower Leg o Initiate Home Health for Skilled Nursing o Home Health Nurse may  visit PRN to address patientos wound care needs. o FACE TO FACE ENCOUNTER: MEDICARE and MEDICAID PATIENTS: I certify that this patient is under my care and that I had a face-to-face encounter that meets the physician face-to-face encounter requirements with this patient on this date. The encounter with the patient was in whole or in part for the following MEDICAL CONDITION: (primary reason for Home Healthcare) MEDICAL NECESSITY: I certify, that based on my findings, NURSING services are a medically necessary home health service. HOME BOUND STATUS: I certify that my clinical findings support that this patient is homebound (i.e., Due to illness or injury, pt requires aid of Dehaas, Emillia H. ([id number]) supportive devices such as crutches, cane, wheelchairs, walkers, the use of special transportation or the assistance of another person to leave their place of residence. There is a normal inability to leave the home and doing so requires considerable and taxing effort. Other absences are for medical reasons / religious services and are infrequent or of short duration when for other  reasons). o If current dressing causes regression in wound condition, may D/C ordered dressing product/s and apply Normal Saline Moist Dressing daily until next Wound Healing Center / Other MD appointment. Notify Wound Healing Center of regression in wound condition at [phone]. o Please direct any NON-WOUND related issues/requests for orders to patient's Primary Care Physician Wound #6 Left,Distal,Lateral Lower Leg o Initiate Home Health for Skilled Nursing o Home Health Nurse may visit PRN to address patientos wound care needs. o FACE TO FACE ENCOUNTER: MEDICARE and MEDICAID PATIENTS: I certify that this patient is under my care and that I had a face-to-face encounter that meets the physician face-to-face encounter requirements with this patient on this date. The encounter with the patient was  in whole or in part for the following MEDICAL CONDITION: (primary reason for Home Healthcare) MEDICAL NECESSITY: I certify, that based on my findings, NURSING services are a medically necessary home health service. HOME BOUND STATUS: I certify that my clinical findings support that this patient is homebound (i.e., Due to illness or injury, pt requires aid of supportive devices such as crutches, cane, wheelchairs, walkers, the use of special transportation or the assistance of another person to leave their place of residence. There is a normal inability to leave the home and doing so requires considerable and taxing effort. Other absences are for medical reasons / religious services and are infrequent or of short duration when for other reasons). o If current dressing causes regression in wound condition, may D/C ordered dressing product/s and apply Normal Saline Moist Dressing daily until next Wound Healing Center / Other MD appointment. Notify Wound Healing Center of regression in wound condition at [phone]. o Please direct any NON-WOUND related issues/requests for orders to patient's Primary Care Physician Electronic Signature(s) Signed: 02/14/2015 3:57:42 PM By: Evlyn Kanner MD, FACS Signed: 02/14/2015 5:09:34 PM By: Curtis Sites Entered By: Curtis Sites on 02/14/2015 14:39:27 Abramovich, Anae Rexene [name] ([id number]) -------------------------------------------------------------------------------- Problem List Details Patient Name: Montero, Reganne H. Date of Service: 02/14/2015 1:45 PM Medical Record Number: [id number] Patient Account Number: [id number] Date of Birth/Sex: 02/20/1956 (59 y.o. 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 Date of Service: 02/14/2015 1:45 PM Medical Record Number: [id number] Patient Account Number: [id number] Date of Birth/Sex: September 09, 1955 (59 y.o. Female) Treating RN: Primary Care Physician: Fidel Levy Other Clinician: Referring Physician: Fidel Levy Treating Physician/Extender: Rudene Re in Treatment: 2 Subjective Chief Complaint Information obtained from Patient Patient presents to the wound care center for a consult due non healing wound. This 59 year old patient has had ulcerated areas on the left lower extremity more than the right lower extremity to very painful for the last 3 weeks. History of Present Illness (HPI) The following HPI elements were documented for the patient's wound: Location: Painful ulcerated areas left lower extremity more than right lower extremity Quality: Patient reports experiencing a sharp pain to  affected area(s). Severity: Patient states wound are getting worse. Duration: Patient has had the wound for < 4 weeks prior to presenting for treatment Timing: Pain in wound is Intermittent (comes and goes Context: The wound appeared gradually over time Modifying Factors: Other treatment(s) tried include: she has had Keflex for about 10 days. Associated Signs and Symptoms: Patient reports having difficulty standing for long periods. 60 year old  patient recently seen by her PCP Dr. Venora Maples who saw her on 01/25/2015 4 ulcers both lower extremities which have been present for about 3 weeks. She took a ten-day course of Keflex and continues to have leg pain and swelling. Past medical history significant for chronic kidney disease, hypertension and diabetes mellitus without complications. She has never been a smoker. Mostly recent CBC was within normal limits, BMP showed that her glucose was 136 and a beard was 45 and a creatinine is 4.19. The patient has had chronic renal failure and had a left AV fistula placed by Dr. Wyn Quaker in May and she is getting ready for hemodialysis soon. She has not been put on any specific medications for this problem. Addendum: tried to get Dr. 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 all questions answered. 02/07/2015 -- the patient started hemodialysis yesterday but was not successful and is going to be seeing Dr. Wyn Quaker regain. She also has a vascular workup for early August. No further investigations or referrals have been made after my conversation with her PCPs office last week. I have asked the patient to call her PCPs office and discuss further investigations and referrals. 02/14/2015 -- she has had 2 sessions of hemodialysis via the right IJ dialysis catheter. She has not seen her PCP or Dr. Wyn Quaker yet. Part of a vascular workup was done today and the next is sometime later in the month. Unfortunately she does not agree to any surgical intervention at this stage and I have not been able to refer her to a surgeon for debridement in the OR. [name], [name] ([id number]) Objective Constitutional Pulse regular. Respirations normal and unlabored. Afebrile. Vitals Time Taken: 1:57 PM, Height: 68 in, Weight: 294 lbs, BMI: 44.7, Temperature: 98.3  F, Pulse: 96 bpm, Respiratory Rate: 18 breaths/min, Blood Pressure: 108/58 mmHg. Eyes Nonicteric. Reactive to light. Ears, Nose, Mouth, and Throat Lips, teeth, and gums WNL.Marland Kitchen Moist mucosa without lesions . Neck supple and nontender. No palpable supraclavicular or cervical adenopathy. Normal sized without goiter. Respiratory WNL. No retractions.. Breath sounds WNL, No rubs, rales, rhonchi, or wheeze.. Cardiovascular Heart rhythm and rate regular, no murmur or gallop.. Pedal Pulses WNL. No clubbing, cyanosis or edema. Chest Breasts symmetical and no nipple discharge.. Breast tissue WNL, no masses, lumps, or tenderness.. Lymphatic No adneopathy. No adenopathy. No adenopathy. Musculoskeletal Adexa without tenderness or enlargement.. Digits and nails w/o clubbing, cyanosis, infection, petechiae, ischemia, or inflammatory conditions.Marland Kitchen Psychiatric Judgement and insight Intact.. No evidence of depression, anxiety, or agitation.. General Notes: she continues to have a lot of dry eschar and some of it has been removed with sharp dissection with forceps and scissors. Integumentary (Hair, Skin) No suspicious lesions. No crepitus or fluctuance. No peri-wound warmth or erythema. No masses.Marland Kitchen [name], [name] ([id number]) Wound #1 status is Open. Original cause of wound was Gradually Appeared. The wound is located on the Right,Proximal Lower Leg. The wound measures  1cm length x 1cm width x 0.1cm depth; 0.785cm^2 area and 0.079cm^3 volume. Wound #2 status is Open. Original cause of wound was Gradually Appeared. The wound is located on the Right,Distal Lower Leg. The wound measures 0.8cm length x 1cm width x 0.2cm depth; 0.628cm^2 area and 0.126cm^3 volume. Wound #3 status is Open. Original cause of wound was Gradually Appeared. The wound is located on the Left,Proximal,Posterior Lower Leg. The wound measures 3.7cm length x 4cm width x 0.1cm depth; 11.624cm^2 area and 1.162cm^3 volume. The wound  is limited to skin breakdown. There is a none present amount of drainage noted. The wound margin is flat and intact. There is no granulation within the wound bed. There is a large (67-100%) amount of necrotic tissue within the wound bed including Eschar and Adherent Slough. The periwound skin appearance exhibited: Localized Edema, Dry/Scaly. The periwound skin appearance did not exhibit: Callus, Crepitus, Excoriation, Fluctuance, Friable, Induration, Rash, Scarring, Maceration, Moist, Atrophie Blanche, Cyanosis, Ecchymosis, Hemosiderin Staining, Mottled, Pallor, Rubor, Erythema. Periwound temperature was noted as No Abnormality. The periwound has tenderness on palpation. Wound #4 status is Open. Original cause of wound was Gradually Appeared. The wound is located on the Left,Distal,Posterior Lower Leg. The wound measures 6cm length x 4cm width x 0.1cm depth; 18.85cm^2 area and 1.885cm^3 volume. The wound is limited to skin breakdown. There is a none present amount of drainage noted. The wound margin is flat and intact. There is no granulation within the wound bed. There is a large (67-100%) amount of necrotic tissue within the wound bed including Eschar and Adherent Slough. The periwound skin appearance exhibited: Localized Edema, Dry/Scaly. The periwound skin appearance did not exhibit: Callus, Crepitus, Excoriation, Fluctuance, Friable, Induration, Rash, Scarring, Maceration, Moist, Atrophie Blanche, Cyanosis, Ecchymosis, Hemosiderin Staining, Mottled, Pallor, Rubor, Erythema. Periwound temperature was noted as No Abnormality. The periwound has tenderness on palpation. Wound #5 status is Open. Original cause of wound was Gradually Appeared. The wound is located on the Left,Proximal,Lateral Lower Leg. The wound measures 4.3cm length x 6.5cm width x 0.1cm depth; 21.952cm^2 area and 2.195cm^3 volume. The wound is limited to skin breakdown. There is a none present amount of drainage noted. The  wound margin is flat and intact. There is no granulation within the wound bed. There is a large (67-100%) amount of necrotic tissue within the wound bed including Eschar and Adherent Slough. The periwound skin appearance exhibited: Localized Edema, Dry/Scaly. The periwound skin appearance did not exhibit: Callus, Crepitus, Excoriation, Fluctuance, Friable, Induration, Rash, Scarring, Maceration, Moist, Atrophie Blanche, Cyanosis, Ecchymosis, Hemosiderin Staining, Mottled, Pallor, Rubor, Erythema. Periwound temperature was noted as No Abnormality. The periwound has tenderness on palpation. Wound #6 status is Open. Original cause of wound was Gradually Appeared. The wound is located on the Left,Distal,Lateral Lower Leg. The wound measures 7cm length x 3.4cm width x 0.1cm depth; 18.692cm^2 area and 1.869cm^3 volume. The wound is limited to skin breakdown. There is a none present amount of drainage noted. The wound margin is flat and intact. There is no granulation within the wound bed. There is a large (67-100%) amount of necrotic tissue within the wound bed including Eschar and Adherent Slough. The periwound skin appearance exhibited: Localized Edema, Dry/Scaly. The periwound skin appearance did not exhibit: Callus, Crepitus, Excoriation, Fluctuance, Friable, Induration, Rash, Scarring, Maceration, Moist, Atrophie Blanche, Cyanosis, Ecchymosis, Hemosiderin Staining, Mottled, Pallor, Rubor, Erythema. Periwound temperature was noted as No Abnormality. The periwound has tenderness on palpation. 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 Wyn Quaker and make sure she takes a decision regarding going to the operating room for debridement under general anesthesia. In the meanwhile we will continue with application of hydrogel with Silver and local dressing changes have been discussed with her in great detail. She will come back and see me next week. Procedures Wound #3 Wound #3 is a Calciphylaxis located on the Left,Proximal,Posterior Lower Leg . There was a Skin/Subcutaneous Tissue Debridement ([id number]) debridement with total area of 14.8 sq cm performed by Saafir Abdullah, Ignacia Felling., MD. with the following instrument(s): Forceps and Scissors to remove Viable and Non-Viable tissue/material including Fibrin/Slough, Eschar, and Subcutaneous after achieving pain control using Lidocaine 4% Topical Solution. A time out was conducted prior to the start of the procedure. A Minimum amount of bleeding was controlled with Pressure. The procedure was tolerated well with a pain level of 0 throughout and a pain level of 8 following the procedure. Post Debridement Measurements: 3.7cm length x 4cm width x 0.4cm depth; 4.65cm^3 volume. Wound #5 Wound #5 is a Calciphylaxis located on the Left,Proximal,Lateral Lower Leg . There was a Skin/Subcutaneous Tissue Debridement ([id number]) debridement with total area of 27.95 sq cm performed by Patt Steinhardt, Ignacia Felling., MD. with the following instrument(s): Forceps to remove Viable and Non- Viable tissue/material including Fibrin/Slough, Eschar, and Subcutaneous after achieving pain control using Lidocaine 4% Topical Solution. A time out was conducted prior to the start of the procedure. There was no bleeding. The procedure was tolerated well with a pain level of 0 throughout and a pain level of 0 following the procedure. Post Debridement Measurements: 4.3cm length x 6.5cm width x 0.1cm depth; 2.195cm^3 volume. [name], [name] ([id number]) Plan Wound Cleansing: Wound #1 Right,Proximal Lower  Leg: Clean wound with Normal Saline. May Shower, gently pat wound dry prior to applying new dressing. Wound #2 Right,Distal Lower Leg: Clean wound with Normal Saline. May Shower, gently pat wound dry prior to applying new dressing. Wound #3 Left,Proximal,Posterior Lower Leg: Clean wound with Normal Saline. May Shower, gently pat wound dry prior to applying new dressing. Wound #4 Left,Distal,Posterior Lower Leg: Clean wound with Normal Saline. May Shower, gently pat wound dry prior to applying new dressing. Wound #5 Left,Proximal,Lateral Lower Leg: Clean wound with Normal Saline. May Shower, gently pat wound dry prior to applying new dressing. Wound #6 Left,Distal,Lateral Lower Leg: Clean wound with Normal Saline. May Shower, gently pat wound dry prior to applying new dressing. Anesthetic: Wound #1 Right,Proximal Lower Leg: Topical Lidocaine 4% cream applied to wound bed prior to debridement Wound #2 Right,Distal Lower Leg: Topical Lidocaine 4% cream applied to wound bed prior to debridement Wound #3 Left,Proximal,Posterior Lower Leg: Topical Lidocaine 4% cream applied to wound bed prior to debridement Wound #4 Left,Distal,Posterior Lower Leg: Topical Lidocaine 4% cream applied to wound bed prior to debridement Wound #5 Left,Proximal,Lateral Lower Leg: Topical Lidocaine 4% cream applied to wound bed prior to debridement Wound #6 Left,Distal,Lateral Lower Leg: Topical Lidocaine 4% cream applied to wound bed prior to debridement Primary Wound Dressing: Wound #1 Right,Proximal Lower Leg: Hydrogel - with Ag Wound #2 Right,Distal Lower Leg: Hydrogel - with Ag Wound #3 Left,Proximal,Posterior Lower Leg: Hydrogel -  with Ag Wound #4 Left,Distal,Posterior Lower Leg: Hydrogel - with Ag Wound #5 Left,Proximal,Lateral Lower Leg: Hydrogel - with Ag Wound #6 Left,Distal,Lateral Lower Leg: Hydrogel - with Ag Secondary Dressing: Wound #1 Right,Proximal Lower Leg: Tagliaferri, Kaylaann H.  ([id number]) Gauze, ABD and Kerlix/Conform Wound #2 Right,Distal Lower Leg: Gauze, ABD and Kerlix/Conform Wound #3 Left,Proximal,Posterior Lower Leg: Gauze, ABD and Kerlix/Conform Wound #4 Left,Distal,Posterior Lower Leg: Gauze, ABD and Kerlix/Conform Wound #5 Left,Proximal,Lateral Lower Leg: Gauze, ABD and Kerlix/Conform Wound #6 Left,Distal,Lateral Lower Leg: Gauze, ABD and Kerlix/Conform Dressing Change Frequency: Wound #1 Right,Proximal Lower Leg: Change dressing every day. Wound #2 Right,Distal Lower Leg: Change dressing every day. Wound #3 Left,Proximal,Posterior Lower Leg: Change dressing every day. Wound #4 Left,Distal,Posterior Lower Leg: Change dressing every day. Wound #5 Left,Proximal,Lateral Lower Leg: Change dressing every day. Wound #6 Left,Distal,Lateral Lower Leg: Change dressing every day. Follow-up Appointments: Wound #1 Right,Proximal Lower Leg: Return Appointment in 1 week. Wound #2 Right,Distal Lower Leg: Return Appointment in 1 week. Wound #3 Left,Proximal,Posterior Lower Leg: Return Appointment in 1 week. Wound #4 Left,Distal,Posterior Lower Leg: Return Appointment in 1 week. Wound #5 Left,Proximal,Lateral Lower Leg: Return Appointment in 1 week. Wound #6 Left,Distal,Lateral Lower Leg: Return Appointment in 1 week. Edema Control: Wound #1 Right,Proximal Lower Leg: Tubigrip Wound #2 Right,Distal Lower Leg: Tubigrip Wound #3 Left,Proximal,Posterior Lower Leg: Tubigrip Wound #4 Left,Distal,Posterior Lower Leg: Tubigrip Wound #5 Left,Proximal,Lateral Lower Leg: Tubigrip Wound #6 Left,Distal,Lateral Lower Leg: Tubigrip Home Health: JESSACA, PHILIPPI ([id number]) Wound #1 Right,Proximal Lower Leg: Initiate Home Health for Skilled Nursing Home Health Nurse may visit PRN to address patient s wound care needs. FACE TO FACE ENCOUNTER: MEDICARE and MEDICAID PATIENTS: I certify that this patient is under my care and that I had a face-to-face  encounter that meets the physician face-to-face encounter requirements with this patient on this date. The encounter with the patient was in whole or in part for the following MEDICAL CONDITION: (primary reason for Home Healthcare) MEDICAL NECESSITY: I certify, that based on my findings, NURSING services are a medically necessary home health service. HOME BOUND STATUS: I certify that my clinical findings support that this patient is homebound (i.e., Due to illness or injury, pt requires aid of supportive devices such as crutches, cane, wheelchairs, walkers, the use of special transportation or the assistance of another person to leave their place of residence. There is a normal inability to leave the home and doing so requires considerable and taxing effort. Other absences are for medical reasons / religious services and are infrequent or of short duration when for other reasons). If current dressing causes regression in wound condition, may D/C ordered dressing product/s and apply Normal Saline Moist Dressing daily until next Wound Healing Center / Other MD appointment. Notify Wound Healing Center of regression in wound condition at [phone]. Please direct any NON-WOUND related issues/requests for orders to patient's Primary Care Physician Wound #2 Right,Distal Lower Leg: Initiate Home Health for Skilled Nursing Home Health Nurse may visit PRN to address patient s wound care needs. FACE TO FACE ENCOUNTER: MEDICARE and MEDICAID PATIENTS: I certify that this patient is under my care and that I had a face-to-face encounter that meets the physician face-to-face encounter requirements with this patient on this date. The encounter with the patient was in whole or in part for the following MEDICAL CONDITION: (primary reason for Home Healthcare) MEDICAL NECESSITY: I certify, that based on my findings, NURSING services are a medically necessary home health service. HOME BOUND STATUS: I certify that  my clinical findings support that this patient is homebound (i.e., Due to illness or injury, pt requires aid of supportive devices such as crutches, cane, wheelchairs, walkers, the use of special transportation or the assistance of another person to leave their place of residence. There is a normal inability to leave the home and doing so requires considerable and taxing effort. Other absences are for medical reasons / religious services and are infrequent or of short duration when for other reasons). If current dressing causes regression in wound condition, may D/C ordered dressing product/s and apply Normal Saline Moist Dressing daily until next Wound Healing Center / Other MD appointment. Notify Wound Healing Center of regression in wound condition at [phone]. Please direct any NON-WOUND related issues/requests for orders to patient's Primary Care Physician Wound #3 Left,Proximal,Posterior Lower Leg: Initiate Home Health for Skilled Nursing Home Health Nurse may visit PRN to address patient s wound care needs. FACE TO FACE ENCOUNTER: MEDICARE and MEDICAID PATIENTS: I certify that this patient is under my care and that I had a face-to-face encounter that meets the physician face-to-face encounter requirements with this patient on this date. The encounter with the patient was in whole or in part for the following MEDICAL CONDITION: (primary reason for Home Healthcare) MEDICAL NECESSITY: I certify, that based on my findings, NURSING services are a medically necessary home health service. HOME BOUND STATUS: I certify that my clinical findings support that this patient is homebound (i.e., Due to illness or injury, pt requires aid of supportive devices such as crutches, cane, wheelchairs, walkers, the use of special transportation or the assistance of another person to leave their place of residence. There is a normal inability to leave the home and doing so requires considerable and taxing  effort. Other absences are for medical reasons / religious services and are infrequent or of short duration when for other reasons). If current dressing causes regression in wound condition, may D/C ordered dressing product/s and apply Normal Saline Moist Dressing daily until next Wound Healing Center / Other MD appointment. Notify Wound Healing Center of regression in wound condition at [phone]. Please direct any NON-WOUND related issues/requests for orders to patient's Primary Care Physician PANSIE, GUGGISBERG ([id number]) Wound #4 Left,Distal,Posterior Lower Leg: Initiate Home Health for Skilled Nursing Home Health Nurse may visit PRN to address patient s wound care needs. FACE TO FACE ENCOUNTER: MEDICARE and MEDICAID PATIENTS: I certify that this patient is under my care and that I had a face-to-face encounter that meets the physician face-to-face encounter requirements with this patient on this date. The encounter with the patient was in whole or in part for the following MEDICAL CONDITION: (primary reason for Home Healthcare) MEDICAL NECESSITY: I certify, that based on my findings, NURSING services are a medically necessary home health service. HOME BOUND STATUS: I certify that my clinical findings support that this patient is homebound (i.e., Due to illness or injury, pt requires aid of supportive devices such as crutches, cane, wheelchairs, walkers, the use of special transportation or the assistance of another person to leave their place of residence. There is a normal inability to leave the home and doing so requires considerable and taxing effort. Other absences are for medical reasons / religious services and are infrequent or of short duration when for other reasons). If current dressing causes regression in wound condition, may D/C ordered dressing product/s and apply Normal Saline Moist Dressing daily until next Wound Healing Center / Other MD appointment. Notify  Wound Healing Center  of regression in wound condition at [phone]. Please direct any NON-WOUND related issues/requests for orders to patient's Primary Care Physician Wound #5 Left,Proximal,Lateral Lower Leg: Initiate Home Health for Skilled Nursing Home Health Nurse may visit PRN to address patient s wound care needs. FACE TO FACE ENCOUNTER: MEDICARE and MEDICAID PATIENTS: I certify that this patient is under my care and that I had a face-to-face encounter that meets the physician face-to-face encounter requirements with this patient on this date. The encounter with the patient was in whole or in part for the following MEDICAL CONDITION: (primary reason for Home Healthcare) MEDICAL NECESSITY: I certify, that based on my findings, NURSING services are a medically necessary home health service. HOME BOUND STATUS: I certify that my clinical findings support that this patient is homebound (i.e., Due to illness or injury, pt requires aid of supportive devices such as crutches, cane, wheelchairs, walkers, the use of special transportation or the assistance of another person to leave their place of residence. There is a normal inability to leave the home and doing so requires considerable and taxing effort. Other absences are for medical reasons / religious services and are infrequent or of short duration when for other reasons). If current dressing causes regression in wound condition, may D/C ordered dressing product/s and apply Normal Saline Moist Dressing daily until next Wound Healing Center / Other MD appointment. Notify Wound Healing Center of regression in wound condition at [phone]. Please direct any NON-WOUND related issues/requests for orders to patient's Primary Care Physician Wound #6 Left,Distal,Lateral Lower Leg: Initiate Home Health for Skilled Nursing Home Health Nurse may visit PRN to address patient s wound care needs. FACE TO FACE ENCOUNTER: MEDICARE and MEDICAID  PATIENTS: I certify that this patient is under my care and that I had a face-to-face encounter that meets the physician face-to-face encounter requirements with this patient on this date. The encounter with the patient was in whole or in part for the following MEDICAL CONDITION: (primary reason for Home Healthcare) MEDICAL NECESSITY: I certify, that based on my findings, NURSING services are a medically necessary home health service. HOME BOUND STATUS: I certify that my clinical findings support that this patient is homebound (i.e., Due to illness or injury, pt requires aid of supportive devices such as crutches, cane, wheelchairs, walkers, the use of special transportation or the assistance of another person to leave their place of residence. There is a normal inability to leave the home and doing so requires considerable and taxing effort. Other absences are for medical reasons / religious services and are infrequent or of short duration when for other reasons). If current dressing causes regression in wound condition, may D/C ordered dressing product/s and apply Normal Saline Moist Dressing daily until next Wound Healing Center / Other MD appointment. Notify Wound Healing Center of regression in wound condition at [phone]. Please direct any NON-WOUND related issues/requests for orders to patient's Primary Care Physician ZOIE, SARIN ([id number]) I have tried to impress upon the patient and her caregiver to follow up more regularly with her PCP and Dr. Wyn Quaker and make sure she takes a decision regarding going to the operating room for debridement under general anesthesia. In the meanwhile we will continue with application of hydrogel with Silver and local dressing changes have been discussed with her in great detail. She will come back and see me next week. Electronic Signature(s) Signed: 02/17/2015 8:50:58 AM By: Evlyn Kanner MD, FACS Previous Signature: 02/14/2015 2:43:36 PM Version  By: Evlyn Kanner MD,  FACS Entered By: Evlyn Kanner on 02/17/2015 08:50:58 [place], [place] Rexene [name] ([id number]) -------------------------------------------------------------------------------- SuperBill Details Patient Name: Godbey, Michaeleen H. Date of Service: 02/14/2015 Medical Record Number: [id number] Patient Account Number: [id number] Date of Birth/Sex: [birth year] (59 y.o. 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 Facility Procedures CPT4 Code: [id number] Description: 11042 - DEB SUBQ TISSUE 20 SQ CM/< ICD-10 Description Diagnosis E11.622 Type 2 diabetes mellitus with other skin ulcer N18.4 Chronic kidney disease, stage 4 (severe) L97.222 Non-pressure chronic ulcer of left calf with fat l L97.212  Non-pressure chronic ulcer of right calf with fat Modifier: ayer exposed layer exposed Quantity: 1 CPT4 Code: [id number] Description: 11045 - DEB SUBQ TISS EA ADDL 20CM ICD-10 Description Diagnosis E11.622 Type 2 diabetes mellitus with other skin ulcer N18.4 Chronic kidney disease, stage 4 (severe) L97.222 Non-pressure chronic ulcer of left calf with fat l L97.212  Non-pressure chronic ulcer of right calf with fat Modifier: ayer exposed layer exposed Quantity: 2 Physician Procedures CPT4 Code: [id number] Mane, PAM Description: 11042 - WC PHYS SUBQ TISS 20 SQ CM ICD-10 Description Diagnosis E11.622 Type 2 diabetes mellitus with other skin ulcer N18.4 Chronic kidney disease, stage 4 (severe) L97.222 Non-pressure chronic ulcer of left calf with fat la L97.212   Non-pressure chronic ulcer of right calf with fat l ELA H. ([id number]) Modifier: yer exposed ayer exposed Quantity: 1 Electronic Signature(s) Signed: 02/14/2015 2:43:58 PM By: Evlyn Kanner MD, FACS Entered By: Evlyn Kanner on 02/14/2015 14:43:58

## 2015-02-15 NOTE — Progress Notes (Signed)
[name], [name] ([id number]) Visit Report for 02/14/2015 Arrival Information Details Patient Name: [name], [name]. Date of Service: 02/14/2015 1:45 PM Medical Record Number: [id number] Patient Account Number: [id number] Date of Birth/Sex: May 17, 1956 (59 y.o. Female) Treating RN: Curtis Sites Primary Care Physician: Fidel Levy Other Clinician: Referring Physician: Fidel Levy Treating Physician/Extender: Rudene Re in Treatment: 2 Visit Information History Since Last Visit Added or deleted any medications: No Patient Arrived: Ambulatory Any new allergies or adverse reactions: No Arrival Time: 13:55 Had a fall or experienced change in No Accompanied By: dtr activities of daily living that may affect Transfer Assistance: None risk of falls: Patient Identification Verified: Yes Signs or symptoms of abuse/neglect since last No Secondary Verification Process Yes visito Completed: Hospitalized since last visit: No Patient Has Alerts: Yes Pain Present Now: No Patient Alerts: DMII ABI [place] Bilateral Electronic Signature(s) Signed: 02/14/2015 5:09:34 PM By: Curtis Sites Entered By: Curtis Sites on 02/14/2015 13:56:12 Beitler, Shenique Rexene Edison ([id number]) -------------------------------------------------------------------------------- Encounter Discharge Information Details Patient Name: [name], [name] Date of Service: 02/14/2015 1:45 PM Medical Record Number: [id number] Patient Account Number: [id number] Date of Birth/Sex: [birth year] (59 y.o. Female) Treating RN: Curtis Sites Primary Care Physician: Fidel Levy Other Clinician: Referring Physician: Fidel Levy Treating Physician/Extender: Rudene Re in Treatment: 2 Encounter Discharge Information Items Discharge Pain Level: 0 Discharge Condition: Stable Ambulatory Status: Ambulatory Discharge Destination: Home Transportation: Private Auto Accompanied By: friend Schedule  Follow-up Appointment: Yes Medication Reconciliation completed and provided to Patient/Care No Kasen Adduci: Provided on Clinical Summary of Care: 02/14/2015 Form Type Recipient Paper Patient PM Electronic Signature(s) Signed: 02/14/2015 3:01:37 PM By: Gwenlyn Perking Entered By: Gwenlyn Perking on 02/14/2015 15:01:36 Menden, Siboney H. ([id number]) -------------------------------------------------------------------------------- Lower Extremity Assessment Details Patient Name: [name], [name] Date of Service: 02/14/2015 1:45 PM Medical Record Number: [id number] Patient Account Number: [id number] Date of Birth/Sex: May 28, 1956 (59 y.o. Female) Treating RN: Curtis Sites Primary Care Physician: Fidel Levy Other Clinician: Referring Physician: Fidel Levy Treating Physician/Extender: Rudene Re in Treatment: 2 Edema Assessment Assessed: [Left: No] [Right: No] Edema: [Left: Yes] [Right: Yes] Calf Left: Right: Point of Measurement: 34 cm From Medial Instep 46.7 cm 43.6 cm Ankle Left: Right: Point of Measurement: 10 cm From Medial Instep 26.3 cm 25 cm Vascular Assessment Pulses: Posterior Tibial Dorsalis Pedis Palpable: [Left:Yes] [Right:Yes] Extremity colors, hair growth, and conditions: Extremity Color: [Left:Pale] [Right:Pale] Hair Growth on Extremity: [Left:No] [Right:No] Temperature of Extremity: [Left:Warm] [Right:Warm] Capillary Refill: [Left:< 3 seconds] [Right:< 3 seconds] Toe Nail Assessment Left: Right: Thick: No No Discolored: No No Deformed: No No Improper Length and Hygiene: No No Electronic Signature(s) Signed: 02/14/2015 5:09:34 PM By: Curtis Sites Entered By: Curtis Sites on 02/14/2015 14:05:29 Varney, Cortez H. ([id number]) -------------------------------------------------------------------------------- Multi Wound Chart Details Patient Name: [name], [name] Date of Service: 02/14/2015 1:45 PM Medical Record Number:  [id number] Patient Account Number: [id number] Date of Birth/Sex: 11-Aug-1955 (59 y.o. 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] Date Acquired: [1:01/09/2015] [2:01/09/2015] [3:01/09/2015] Weeks of Treatment: [1:2] [2:2] [3:2] Wound Status: [  1:Open] [2:Open] [3:Open] Measurements L x W x D 1x1x0.1 [2:0.8x1x0.2] [3:3.7x4x0.1] (cm) Area (cm) : [1:0.785] [2:0.628] [3:11.624] Volume (cm) : [1:0.079] [2:0.126] [3:1.162] % Reduction in Area: [1:-1570.20%] [2:-568.10%] [3:-5.70%] % Reduction in Volume: -1480.00% [2:-1300.00%] [3:-5.60%] Classification: [1:Partial Thickness] [2:Partial Thickness] [3:Full Thickness Without Exposed Support Structures] Periwound Skin Texture: No Abnormalities Noted [2:No Abnormalities Noted] [3:No Abnormalities Noted] Periwound Skin [1:No Abnormalities Noted] [2:No Abnormalities Noted] [3:No Abnormalities Noted] Moisture: Periwound Skin Color: No Abnormalities Noted [2:No Abnormalities Noted] [3:No Abnormalities Noted] Tenderness on [1:No] [2:No] [3:No] Wound Number: 4 [id number] 6  Photos: No Photos No Photos No Photos Wound Location: Left, Distal, Posterior Left, Proximal, Lateral Left, Distal, Lateral Lower Lower Leg Lower Leg Leg Wounding Event: Gradually Appeared Gradually Appeared Gradually Appeared 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 ([id number]) Date Acquired: 01/09/2015 01/09/2015 01/09/2015 Weeks of Treatment: 2 2 2  Wound Status: Open Open Open Measurements L x W x D 6x4x0.1 4.3x6.5x0.1 7x3.4x0.1 (cm) Area (cm) : 18.85 21.952 18.692 Volume (cm) : 1.885 2.195 1.869 % Reduction in Area: -11.80% -104.30% -30.50% % Reduction in Volume: -11.90% -104.40% -30.40% Classification: Full Thickness Without Full Thickness Without Full Thickness Without Exposed Support Exposed Support Exposed Support Structures Structures Structures Periwound Skin Texture: No Abnormalities Noted No Abnormalities Noted No Abnormalities Noted Periwound Skin No Abnormalities Noted No Abnormalities Noted No Abnormalities Noted Moisture: Periwound Skin Color: No Abnormalities Noted No Abnormalities Noted No Abnormalities Noted Tenderness on No No No Palpation: Treatment Notes Electronic Signature(s) Signed: 02/14/2015 5:09:34 PM By: Curtis Sites Entered By: Curtis Sites on 02/14/2015 14:23:05 Sevigny, [name] ([id number]) -------------------------------------------------------------------------------- Multi-Disciplinary Care Plan Details Patient Name: Rawdon, Sephora H. Date of Service: 02/14/2015 1:45 PM Medical Record Number: [id number] Patient Account Number: [id number] Date of Birth/Sex: 06/28/1956 (59 y.o. Female) Treating RN: Curtis Sites Primary Care Physician: Fidel Levy Other Clinician: Referring Physician: Fidel Levy Treating Physician/Extender: Rudene Re in Treatment: 2 Active Inactive Orientation to the Wound Care Program Nursing Diagnoses: Knowledge deficit related to the wound healing center program Goals: Patient/caregiver will verbalize understanding of the Wound Healing Center Program Date Initiated: 01/30/2015 Goal Status: Active Interventions: Provide education on orientation to the wound center Notes: Pain, Acute  or Chronic Nursing Diagnoses: Pain, acute or chronic: actual or potential Goals: Patient will verbalize adequate pain control and receive pain control interventions during procedures as needed Date Initiated: 01/30/2015 Goal Status: Active Patient/caregiver will verbalize adequate pain control between visits Date Initiated: 01/30/2015 Goal Status: Active Interventions: Assess comfort goal upon admission Encourage patient to take pain medications as prescribed Notes: Venous Leg Ulcer Nursing Diagnoses: Lasorsa, Ercell H. ([id number]) Potential for venous Insuffiency (use before diagnosis confirmed) Goals: Non-invasive venous studies are completed as ordered Date Initiated: 01/30/2015 Goal Status: Active Interventions: Assess peripheral edema status every visit. Notes: Wound/Skin Impairment Nursing Diagnoses: Impaired tissue integrity Goals: Ulcer/skin breakdown will have a volume reduction of 30% by week 4 Date Initiated: 01/30/2015 Goal Status: Active Ulcer/skin breakdown will have a volume reduction of 50% by week 8 Date Initiated: 01/30/2015 Goal Status: Active Ulcer/skin breakdown will have a volume reduction of 80% by week 12 Date Initiated: 01/30/2015 Goal Status: Active Ulcer/skin breakdown will heal within 14 weeks Date Initiated: 01/30/2015 Goal Status: Active Interventions: Assess ulceration(s) every visit Notes: Electronic Signature(s) Signed: 02/14/2015 5:09:34 PM By: Curtis Sites Entered By: Curtis Sites on 02/14/2015 14:22:58 Viviani, [name] ([id number]) -------------------------------------------------------------------------------- Patient/Caregiver Education Details Patient Name: Tucci, Zell H. Date of Service: 02/14/2015 1:45  PM Medical Record Number: [id number] Patient Account Number: [id number] Date of Birth/Gender: 1956/07/13 (59 y.o. Female) Treating RN: Curtis Sites Primary Care Physician: Fidel Levy Other Clinician: Referring  Physician: Fidel Levy Treating Physician/Extender: Rudene Re in Treatment: 2 Education Assessment Education Provided To: Patient Education Topics Provided Wound/Skin Impairment: Handouts: Other: wound care as ordered Methods: Demonstration, Explain/Verbal Responses: State content correctly Electronic Signature(s) Signed: 02/14/2015 5:09:34 PM By: Curtis Sites Entered By: Curtis Sites on 02/14/2015 14:57:29 Earlywine, Zina H. ([id number]) -------------------------------------------------------------------------------- Wound Assessment Details Patient Name: Loman, Samra H. Date of Service: 02/14/2015 1:45 PM Medical Record Number: [id number] Patient Account Number: [id number] Date of Birth/Sex: Aug 23, 1955 (59 y.o. Female) Treating RN: Curtis Sites Primary Care Physician: Fidel Levy Other Clinician: Referring Physician: Fidel Levy Treating Physician/Extender: Rudene Re in Treatment: 2 Wound Status Wound Number: 1 Primary Etiology: Calciphylaxis Wound Location: Right, Proximal Lower Leg Wound Status: Open Wounding Event: Gradually Appeared Date Acquired: 01/09/2015 Weeks Of Treatment: 2 Clustered Wound: No Photos Photo Uploaded By: Curtis Sites on 02/14/2015 17:01:11 Wound Measurements Length: (cm) 1 Width: (cm) 1 Depth: (cm) 0.1 Area: (cm) 0.785 Volume: (cm) 0.079 % Reduction in Area: -1570.2% % Reduction in Volume: -1480% Wound Description Classification: Partial Thickness Periwound Skin Texture Texture Color No Abnormalities Noted: No No Abnormalities Noted: No Moisture No Abnormalities Noted: No Electronic Signature(s) Signed: 02/14/2015 5:09:34 PM By: Vilma Prader, [name] ([id number]) Entered By: Curtis Sites on 02/14/2015 14:22:19 Mackiewicz, Dylin H. ([id number]) -------------------------------------------------------------------------------- Wound Assessment Details Patient Name: Pavlik,  Alanii H. Date of Service: 02/14/2015 1:45 PM Medical Record Number: [id number] Patient Account Number: [id number] Date of Birth/Sex: 1956-02-10 (59 y.o. Female) Treating RN: Curtis Sites Primary Care Physician: Fidel Levy Other Clinician: Referring Physician: Fidel Levy Treating Physician/Extender: Rudene Re in Treatment: 2 Wound Status Wound Number: 2 Primary Etiology: Calciphylaxis Wound Location: Right, Distal Lower Leg Wound Status: Open Wounding Event: Gradually Appeared Date Acquired: 01/09/2015 Weeks Of Treatment: 2 Clustered Wound: No Photos Photo Uploaded By: Curtis Sites on 02/14/2015 17:00:53 Wound Measurements Length: (cm) 0.8 Width: (cm) 1 Depth: (cm) 0.2 Area: (cm) 0.628 Volume: (cm) 0.126 % Reduction in Area: -568.1% % Reduction in Volume: -1300% Wound Description Classification: Partial Thickness Periwound Skin Texture Texture Color No Abnormalities Noted: No No Abnormalities Noted: No Moisture No Abnormalities Noted: No Treatment Notes Wound #2 (Right, Distal Lower Leg) 1. Cleansed with: Witty, Penne H. ([id number]) Clean wound with Normal Saline 2. Anesthetic Topical Lidocaine 4% cream to wound bed prior to debridement 4. Dressing Applied: Hydrogel 5. Secondary Dressing Applied Kerlix/Conform Non-Adherent pad 7. Secured with Tape Notes hydrogel AG Electronic Signature(s) Signed: 02/14/2015 5:09:34 PM By: Curtis Sites Entered By: Curtis Sites on 02/14/2015 14:22:19 Symanski, Toneshia Rexene Edison ([id number]) -------------------------------------------------------------------------------- Wound Assessment Details Patient Name: Hoffer, Anahita H. Date of Service: 02/14/2015 1:45 PM Medical Record Number: [id number] Patient Account Number: [id number] Date of Birth/Sex: 1956/01/31 (59 y.o. Female) Treating RN: Curtis Sites Primary Care Physician: Fidel Levy Other Clinician: Referring Physician: Fidel Levy Treating Physician/Extender: Rudene Re in Treatment: 2 Wound Status Wound Number: 3 Primary Etiology: Calciphylaxis Wound Location: Left Lower Leg - Posterior, Wound Status: Open Proximal Comorbid History: Hypertension, Type II Diabetes Wounding Event: Gradually Appeared Date Acquired: 01/09/2015 Weeks Of Treatment: 2 Clustered Wound: No Photos Wound Measurements Length: (cm) 3.7 Width: (cm) 4 Depth: (cm) 0.1 Area: (cm) 11.624 Volume: (cm) 1.162 % Reduction in Area: -5.7% % Reduction in Volume: -5.6% Epithelialization: None Wound Description Full Thickness  Without Classification: Exposed Support Structures Diabetic Severity Grade 2 (Wagner): Wound Margin: Flat and Intact Exudate Amount: None Present Foul Odor After Cleansing: No Wound Bed Granulation Amount: None Present (0%) Exposed Structure Necrotic Amount: Large (67-100%) Fascia Exposed: No Necrotic Quality: Eschar, Adherent Slough Fat Layer Exposed: No Tendon Exposed: No Barhorst, Rocklyn H. ([id number]) Muscle Exposed: No Joint Exposed: No Bone Exposed: No Limited to Skin Breakdown Periwound Skin Texture Texture Color No Abnormalities Noted: No No Abnormalities Noted: No Callus: No Atrophie Blanche: No Crepitus: No Cyanosis: No Excoriation: No Ecchymosis: No Fluctuance: No Erythema: No Friable: No Hemosiderin Staining: No Induration: No Mottled: No Localized Edema: Yes Pallor: No Rash: No Rubor: No Scarring: No Temperature / Pain Moisture Temperature: No Abnormality No Abnormalities Noted: No Tenderness on Palpation: Yes Dry / Scaly: Yes Maceration: No Moist: No Wound Preparation Ulcer Cleansing: Rinsed/Irrigated with Saline Topical Anesthetic Applied: Other: lidocaine 4%, Treatment Notes Wound #3 (Left, Proximal, Posterior Lower Leg) 1. Cleansed with: Clean wound with Normal Saline 2. Anesthetic Topical Lidocaine 4% cream to wound bed prior to debridement 4.  Dressing Applied: Hydrogel 5. Secondary Dressing Applied Kerlix/Conform Non-Adherent pad 7. Secured with Tape Notes hydrogel AG Electronic Signature(s) Signed: 02/14/2015 5:03:00 PM By: Curtis Sites Entered By: Curtis Sites on 02/14/2015 17:03:00 Chiong, [name] ([id number]) Horsch, Elfrieda Rexene Edison ([id number]) -------------------------------------------------------------------------------- Wound Assessment Details Patient Name: Schwinn, Saranne H. Date of Service: 02/14/2015 1:45 PM Medical Record Number: [id number] Patient Account Number: [id number] Date of Birth/Sex: 07-12-1956 (59 y.o. Female) Treating RN: Curtis Sites Primary Care Physician: Fidel Levy Other Clinician: Referring Physician: Fidel Levy Treating Physician/Extender: Rudene Re in Treatment: 2 Wound Status Wound Number: 4 Primary Etiology: Calciphylaxis Wound Location: Left Lower Leg - Posterior, Wound Status: Open Distal Comorbid History: Hypertension, Type II Diabetes Wounding Event: Gradually Appeared Date Acquired: 01/09/2015 Weeks Of Treatment: 2 Clustered Wound: No Photos Wound Measurements Length: (cm) 6 Width: (cm) 4 Depth: (cm) 0.1 Area: (cm) 18.85 Volume: (cm) 1.885 % Reduction in Area: -11.8% % Reduction in Volume: -11.9% Epithelialization: None Wound Description Full Thickness Without Classification: Exposed Support Structures Diabetic Severity Grade 2 (Wagner): Wound Margin: Flat and Intact Exudate Amount: None Present Foul Odor After Cleansing: No Wound Bed Granulation Amount: None Present (0%) Exposed Structure Necrotic Amount: Large (67-100%) Fascia Exposed: No Necrotic Quality: Eschar, Adherent Slough Fat Layer Exposed: No Tendon Exposed: No Homan, Nitara H. ([id number]) Muscle Exposed: No Joint Exposed: No Bone Exposed: No Limited to Skin Breakdown Periwound Skin Texture Texture Color No Abnormalities Noted: No No Abnormalities Noted:  No Callus: No Atrophie Blanche: No Crepitus: No Cyanosis: No Excoriation: No Ecchymosis: No Fluctuance: No Erythema: No Friable: No Hemosiderin Staining: No Induration: No Mottled: No Localized Edema: Yes Pallor: No Rash: No Rubor: No Scarring: No Temperature / Pain Moisture Temperature: No Abnormality No Abnormalities Noted: No Tenderness on Palpation: Yes Dry / Scaly: Yes Maceration: No Moist: No Wound Preparation Ulcer Cleansing: Rinsed/Irrigated with Saline Topical Anesthetic Applied: Other: lidocaine 4%, Treatment Notes Wound #4 (Left, Distal, Posterior Lower Leg) 1. Cleansed with: Clean wound with Normal Saline 2. Anesthetic Topical Lidocaine 4% cream to wound bed prior to debridement 4. Dressing Applied: Hydrogel 5. Secondary Dressing Applied Kerlix/Conform Non-Adherent pad 7. Secured with Tape Notes hydrogel AG Electronic Signature(s) Signed: 02/14/2015 5:03:38 PM By: Curtis Sites Entered By: Curtis Sites on 02/14/2015 17:03:37 Oconnell, [name] ([id number]) Leavy, Rever Rexene Edison ([id number]) -------------------------------------------------------------------------------- Wound Assessment Details Patient Name: Tesmer, Zayneb H. Date of Service: 02/14/2015 1:45 PM Medical Record Number:  [id number] Patient Account Number: [id number] Date of Birth/Sex: December 27, 1955 (59 y.o. Female) Treating RN: Curtis Sites Primary Care Physician: Fidel Levy Other Clinician: Referring Physician: Fidel Levy Treating Physician/Extender: Rudene Re in Treatment: 2 Wound Status Wound Number: 5 Primary Etiology: Calciphylaxis Wound Location: Left Lower Leg - Lateral, Wound Status: Open Proximal Comorbid History: Hypertension, Type II Diabetes Wounding Event: Gradually Appeared Date Acquired: 01/09/2015 Weeks Of Treatment: 2 Clustered Wound: No Photos Wound Measurements Length: (cm) 4.3 Width: (cm) 6.5 Depth: (cm) 0.1 Area: (cm)  21.952 Volume: (cm) 2.195 % Reduction in Area: -104.3% % Reduction in Volume: -104.4% Epithelialization: None Wound Description Full Thickness Without Classification: Exposed Support Structures Diabetic Severity Grade 2 (Wagner): Wound Margin: Flat and Intact Exudate Amount: None Present Foul Odor After Cleansing: No Wound Bed Granulation Amount: None Present (0%) Exposed Structure Necrotic Amount: Large (67-100%) Fascia Exposed: No Necrotic Quality: Eschar, Adherent Slough Fat Layer Exposed: No Tendon Exposed: No Phariss, Jeana H. ([id number]) Muscle Exposed: No Joint Exposed: No Bone Exposed: No Limited to Skin Breakdown Periwound Skin Texture Texture Color No Abnormalities Noted: No No Abnormalities Noted: No Callus: No Atrophie Blanche: No Crepitus: No Cyanosis: No Excoriation: No Ecchymosis: No Fluctuance: No Erythema: No Friable: No Hemosiderin Staining: No Induration: No Mottled: No Localized Edema: Yes Pallor: No Rash: No Rubor: No Scarring: No Temperature / Pain Moisture Temperature: No Abnormality No Abnormalities Noted: No Tenderness on Palpation: Yes Dry / Scaly: Yes Maceration: No Moist: No Wound Preparation Ulcer Cleansing: Rinsed/Irrigated with Saline Topical Anesthetic Applied: Other: lidocaine 4%, Treatment Notes Wound #5 (Left, Proximal, Lateral Lower Leg) 1. Cleansed with: Clean wound with Normal Saline 2. Anesthetic Topical Lidocaine 4% cream to wound bed prior to debridement 4. Dressing Applied: Hydrogel 5. Secondary Dressing Applied Kerlix/Conform Non-Adherent pad 7. Secured with Tape Notes hydrogel AG Electronic Signature(s) Signed: 02/14/2015 5:04:22 PM By: Curtis Sites Entered By: Curtis Sites on 02/14/2015 17:04:22 Schiano, [name] ([id number]) Nunley, [name] ([id number]) -------------------------------------------------------------------------------- Wound Assessment Details Patient Name: Debord,  Camarie H. Date of Service: 02/14/2015 1:45 PM Medical Record Number: [id number] Patient Account Number: [id number] Date of Birth/Sex: [birth year] (59 y.o. Female) Treating RN: Curtis Sites Primary Care Physician: Fidel Levy Other Clinician: Referring Physician: Fidel Levy Treating Physician/Extender: Rudene Re in Treatment: 2 Wound Status Wound Number: 6 Primary Etiology: Calciphylaxis Wound Location: Left Lower Leg - Lateral, Distal Wound Status: Open Wounding Event: Gradually Appeared Comorbid History: Hypertension, Type II Diabetes Date Acquired: 01/09/2015 Weeks Of Treatment: 2 Clustered Wound: No Photos Wound Measurements Length: (cm) 7 Width: (cm) 3.4 Depth: (cm) 0.1 Area: (cm) 18.692 Volume: (cm) 1.869 % Reduction in Area: -30.5% % Reduction in Volume: -30.4% Epithelialization: None Wound Description Full Thickness Without Classification: Exposed Support Structures Diabetic Severity Grade 2 (Wagner): Wound Margin: Flat and Intact Exudate Amount: None Present Foul Odor After Cleansing: No Wound Bed Granulation Amount: None Present (0%) Exposed Structure Necrotic Amount: Large (67-100%) Fascia Exposed: No Necrotic Quality: Eschar, Adherent Slough Fat Layer Exposed: No Tendon Exposed: No Zwicker, Avira H. ([id number]) Muscle Exposed: No Joint Exposed: No Bone Exposed: No Limited to Skin Breakdown Periwound Skin Texture Texture Color No Abnormalities Noted: No No Abnormalities Noted: No Callus: No Atrophie Blanche: No Crepitus: No Cyanosis: No Excoriation: No Ecchymosis: No Fluctuance: No Erythema: No Friable: No Hemosiderin Staining: No Induration: No Mottled: No Localized Edema: Yes Pallor: No Rash: No Rubor: No Scarring: No Temperature / Pain Moisture Temperature: No Abnormality No Abnormalities Noted: No Tenderness on Palpation: Yes  Dry / Scaly: Yes Maceration: No Moist: No Wound Preparation Ulcer  Cleansing: Rinsed/Irrigated with Saline Topical Anesthetic Applied: Other: lidocaine 4%, Treatment Notes Wound #6 (Left, Distal, Lateral Lower Leg) 1. Cleansed with: Clean wound with Normal Saline 2. Anesthetic Topical Lidocaine 4% cream to wound bed prior to debridement 4. Dressing Applied: Hydrogel 5. Secondary Dressing Applied Kerlix/Conform Non-Adherent pad 7. Secured with Tape Notes hydrogel AG Electronic Signature(s) Signed: 02/14/2015 5:04:57 PM By: Curtis Sites Entered By: Curtis Sites on 02/14/2015 17:04:56 Cantrelle, [name] ([id number]) Chap, Brittie Rexene Edison ([id number]) -------------------------------------------------------------------------------- Vitals Details Patient Name: Babers, Simmie H. Date of Service: 02/14/2015 1:45 PM Medical Record Number: [id number] Patient Account Number: [id number] Date of Birth/Sex: [birth year] (59 y.o. Female) Treating RN: Curtis Sites Primary Care Physician: Fidel Levy Other Clinician: Referring Physician: Fidel Levy Treating Physician/Extender: Rudene Re in Treatment: 2 Vital Signs Time Taken: 13:57 Temperature (F): 98.3 Height (in): 68 Pulse (bpm): 96 Weight (lbs): 294 Respiratory Rate (breaths/min): 18 Body Mass Index (BMI): 44.7 Blood Pressure (mmHg): 108/58 Reference Range: 80 - 120 mg / dl Electronic Signature(s) Signed: 02/14/2015 5:09:34 PM By: Curtis Sites Entered By: Curtis Sites on 02/14/2015 13:57:29

## 2015-02-21 ENCOUNTER — Encounter: Payer: BC Managed Care – PPO | Admitting: Surgery

## 2015-02-21 DIAGNOSIS — L97222 Non-pressure chronic ulcer of left calf with fat layer exposed: Secondary | ICD-10-CM | POA: Diagnosis not present

## 2015-02-21 NOTE — Progress Notes (Addendum)
KINSLIE, [name] ([id number]) Visit Report for 02/21/2015 Arrival Information Details Patient Name: [name], [name]. Date of Service: 02/21/2015 2:30 PM Medical Record Number: [id number] Patient Account Number: [id number] Date of Birth/Sex: 1956-02-09 (59 y.o. Female) Treating RN: Afful, RN, BSN, [place] Sink Primary Care Physician: Fidel Levy Other Clinician: Referring Physician: Fidel Levy Treating Physician/Extender: Rudene Re in Treatment: 3 Visit Information History Since Last Visit Any new allergies or adverse reactions: No Patient Arrived: Ambulatory Had a fall or experienced change in No Arrival Time: 14:31 activities of daily living that may affect Accompanied By: self risk of falls: Transfer Assistance: None Signs or symptoms of abuse/neglect since last No Patient Identification Verified: Yes visito Secondary Verification Process Yes Hospitalized since last visit: No Completed: Has Dressing in Place as Prescribed: Yes Patient Has Alerts: Yes Pain Present Now: No Patient Alerts: DMII ABI Mandan Bilateral Electronic Signature(s) Signed: 02/21/2015 2:31:33 PM By: Elpidio Eric BSN, RN Entered By: Elpidio Eric on 02/21/2015 14:31:32 Keel, Anahlia Rexene Edison ([id number]) -------------------------------------------------------------------------------- Clinic Level of Care Assessment Details Patient Name: [name], [name] Date of Service: 02/21/2015 2:30 PM Medical Record Number: [id number] Patient Account Number: [id number] Date of Birth/Sex: 11/05/1955 (59 y.o. Female) Treating RN: Afful, RN, BSN, [place] Sink Primary Care Physician: Fidel Levy Other Clinician: Referring Physician: Fidel Levy Treating Physician/Extender: Rudene Re in Treatment: 3 Clinic Level of Care Assessment Items TOOL 4 Quantity Score []  - Use when only an EandM is performed on FOLLOW-UP visit 0 ASSESSMENTS - Nursing Assessment / Reassessment X - Reassessment of  Co-morbidities (includes updates in patient status) 1 10 X - Reassessment of Adherence to Treatment Plan 1 5 ASSESSMENTS - Wound and Skin Assessment / Reassessment []  - Simple Wound Assessment / Reassessment - one wound 0 X - Complex Wound Assessment / Reassessment - multiple wounds 6 5 []  - Dermatologic / Skin Assessment (not related to wound area) 0 ASSESSMENTS - Focused Assessment []  - Circumferential Edema Measurements - multi extremities 0 []  - Nutritional Assessment / Counseling / Intervention 0 X - Lower Extremity Assessment (monofilament, tuning fork, pulses) 1 5 []  - Peripheral Arterial Disease Assessment (using hand held doppler) 0 ASSESSMENTS - Ostomy and/or Continence Assessment and Care []  - Incontinence Assessment and Management 0 []  - Ostomy Care Assessment and Management (repouching, etc.) 0 PROCESS - Coordination of Care []  - Simple Patient / Family Education for ongoing care 0 []  - Complex (extensive) Patient / Family Education for ongoing care 0 []  - Staff obtains Chiropractor, Records, Test Results / Process Orders 0 []  - Staff telephones HHA, Nursing Homes / Clarify orders / etc 0 []  - Routine Transfer to another Facility (non-emergent condition) 0 Alberts, Saman H. ([id number]) []  - Routine Hospital Admission (non-emergent condition) 0 []  - New Admissions / Manufacturing engineer / Ordering NPWT, Apligraf, etc. 0 []  - Emergency Hospital Admission (emergent condition) 0 []  - Simple Discharge Coordination 0 []  - Complex (extensive) Discharge Coordination 0 PROCESS - Special Needs []  - Pediatric / Minor Patient Management 0 []  - Isolation Patient Management 0 []  - Hearing / Language / Visual special needs 0 []  - Assessment of Community assistance (transportation, D/C planning, etc.) 0 []  - Additional assistance / Altered mentation 0 []  - Support Surface(s) Assessment (bed, cushion, seat, etc.) 0 INTERVENTIONS - Wound Cleansing / Measurement []  - Simple Wound  Cleansing - one wound 0 X - Complex Wound Cleansing - multiple wounds 6 5 X - Wound Imaging (photographs - any number of wounds) 1  5 []  - Wound Tracing (instead of photographs) 0 []  - Simple Wound Measurement - one wound 0 X - Complex Wound Measurement - multiple wounds 6 5 INTERVENTIONS - Wound Dressings X - Small Wound Dressing one or multiple wounds 6 10 []  - Medium Wound Dressing one or multiple wounds 0 []  - Large Wound Dressing one or multiple wounds 0 X - Application of Medications - topical 1 5 []  - Application of Medications - injection 0 INTERVENTIONS - Miscellaneous []  - External ear exam 0 [name], [name] ([id number]) []  - Specimen Collection (cultures, biopsies, blood, body fluids, etc.) 0 []  - Specimen(s) / Culture(s) sent or taken to Lab for analysis 0 []  - Patient Transfer (multiple staff / Michiel Sites Lift / Similar devices) 0 []  - Simple Staple / Suture removal (25 or less) 0 []  - Complex Staple / Suture removal (26 or more) 0 []  - Hypo / Hyperglycemic Management (close monitor of Blood Glucose) 0 []  - Ankle / Brachial Index (ABI) - do not check if billed separately 0 X - Vital Signs 1 5 Has the patient been seen at the hospital within the last three years: Yes Total Score: 185 Level Of Care: New/Established - Level 5 Electronic Signature(s) Signed: 02/21/2015 3:17:53 PM By: Elpidio Eric BSN, RN Entered By: Elpidio Eric on 02/21/2015 15:17:52 Glorioso, [name] ([id number]) -------------------------------------------------------------------------------- Encounter Discharge Information Details Patient Name: [name], [name] Date of Service: 02/21/2015 2:30 PM Medical Record Number: [id number] Patient Account Number: [id number] Date of Birth/Sex: [birth year] (59 y.o. Female) Treating RN: Clover Mealy, RN, BSN, [place] Sink Primary Care Physician: Fidel Levy Other Clinician: Referring Physician: Fidel Levy Treating Physician/Extender: Rudene Re in Treatment:  3 Encounter Discharge Information Items Discharge Pain Level: 0 Discharge Condition: Stable Ambulatory Status: Ambulatory Discharge Destination: Home Transportation: Private Auto Accompanied By: self Schedule Follow-up Appointment: No Medication Reconciliation completed and provided to Patient/Care No Bart Ashford: Provided on Clinical Summary of Care: 02/21/2015 Form Type Recipient Paper Patient PM Electronic Signature(s) Signed: 02/21/2015 3:19:14 PM By: Elpidio Eric BSN, RN Previous Signature: 02/21/2015 3:14:39 PM Version By: Gwenlyn Perking Entered By: Elpidio Eric on 02/21/2015 15:19:14 Crosby, Jatziri Rexene Edison ([id number]) -------------------------------------------------------------------------------- Lower Extremity Assessment Details Patient Name: Decola, Briggette H. Date of Service: 02/21/2015 2:30 PM Medical Record Number: [id number] Patient Account Number: [id number] Date of Birth/Sex: 09/18/1955 (59 y.o. Female) Treating RN: Afful, RN, BSN, [place] Sink Primary Care Physician: Fidel Levy Other Clinician: Referring Physician: Fidel Levy Treating Physician/Extender: Rudene Re in Treatment: 3 Edema Assessment Assessed: [Left: No] [Right: No] E[Left: dema] [Right: :] Calf Left: Right: Point of Measurement: 34 cm From Medial Instep 48 cm 43 cm Ankle Left: Right: Point of Measurement: 10 cm From Medial Instep 26.5 cm 25.4 cm Vascular Assessment Claudication: Claudication Assessment [Right:None] Pulses: Posterior Tibial Dorsalis Pedis Palpable: [Left:Yes] [Right:Yes] Extremity colors, hair growth, and conditions: Extremity Color: [Left:Mottled] [Right:Normal] Hair Growth on Extremity: [Left:No] [Right:No] Temperature of Extremity: [Left:Warm] [Right:Warm] Capillary Refill: [Left:< 3 seconds] [Right:< 3 seconds] Toe Nail Assessment Left: Right: Thick: No No Discolored: No No Deformed: No No Improper Length and Hygiene: No No Electronic Signature(s) Signed:  02/21/2015 2:35:31 PM By: Elpidio Eric BSN, RN Entered By: Elpidio Eric on 02/21/2015 14:35:31 Dacy, [name] ([id number]) Kleinsasser, Daijanae H. ([id number]) -------------------------------------------------------------------------------- Multi Wound Chart Details Patient Name: Mikita, Neyah H. Date of Service: 02/21/2015 2:30 PM Medical Record Number: [id number] Patient Account Number: [id number] Date of Birth/Sex: [birth year] (59 y.o. Female) Treating RN: Afful, RN, BSN,  Sink Primary Care Physician: Juanetta Gosling  Freida Busman Other Clinician: Referring Physician: Fidel Levy Treating Physician/Extender: Rudene Re in Treatment: 3 Vital Signs Height(in): 68 Pulse(bpm): 85 Weight(lbs): 294 Blood Pressure 119/64 (mmHg): Body Mass Index(BMI): 45 Temperature(F): 98.4 Respiratory Rate 18 (breaths/min): Photos: [1:No Photos] [2:No Photos] [3:No Photos] Wound Location: [1:Right Lower Leg - Proximal] [2:Right Lower Leg - Distal] [3:Left Lower Leg - Posterior, Proximal] Wounding Event: [1:Gradually Appeared] [2:Gradually Appeared] [3:Gradually Appeared] 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] [2:Calciphylaxis] [3:Calciphylaxis] Comorbid History: [1:Hypertension, Type II Diabetes] [2:Hypertension, Type II Diabetes] [3:Hypertension, Type II Diabetes] Date Acquired: [1:01/09/2015] [2:01/09/2015] [3:01/09/2015] Weeks of Treatment: [1:3] [2:3] [3:3] Wound Status: [1:Open] [2:Open] [3:Open] Measurements L x W x D 1x1x0.1 [2:1x1.2x0.2] [3:3.7x4x0.1] (cm) Area (cm) : [1:0.785] [2:0.942] [3:11.624] Volume (cm) : [1:0.079] [2:0.188] [3:1.162] % Reduction in Area: [1:-1570.20%] [2:-902.10%] [3:-5.70%] % Reduction in Volume: -1480.00% [2:-1988.90%] [3:-5.60%] Classification: [1:Partial Thickness] [2:Partial Thickness] [3:Full Thickness Without Exposed Support Structures] HBO Classification: [1:Grade 1] [2:Grade 1] [3:Grade 2] Exudate Amount: [1:Small] [2:Small] [3:None Present] Exudate Type:  [1:Serosanguineous] [2:Serosanguineous] [3:N/A] Exudate Color: [1:red, brown] [2:red, brown] [3:N/A] Wound Margin: [1:Distinct, outline attached Distinct, outline attached Flat and Intact] Granulation Amount: [1:None Present (0%)] [2:None Present (0%)] [3:None Present (0%)] Necrotic Amount: [1:Large (67-100%)] [2:Large (67-100%)] [3:Large (67-100%)] Necrotic Tissue: [1:Eschar] [2:Eschar] [3:Eschar, Adherent Slough] Exposed Structures: [1:Fascia: No Fat: No Tendon: No] [2:Fascia: No Fat: No Tendon: No] [3:Fascia: No Fat: No Tendon: No] Muscle: No Muscle: No Muscle: No Joint: No Joint: No Joint: No Bone: No Bone: No Bone: No Limited to Skin Limited to Skin Limited to Skin Breakdown Breakdown Breakdown Epithelialization: None None None Periwound Skin Texture: Edema: Yes Edema: Yes Edema: Yes Excoriation: No Excoriation: No Excoriation: No Induration: No Induration: No Induration: No Callus: No Callus: No Callus: No Crepitus: No Crepitus: No Crepitus: No Fluctuance: No Fluctuance: No Fluctuance: No Friable: No Friable: No Friable: No Rash: No Rash: No Rash: No Scarring: No Scarring: No Scarring: No Periwound Skin Moist: Yes Moist: Yes Dry/Scaly: Yes Moisture: Dry/Scaly: Yes Dry/Scaly: Yes Maceration: No Maceration: No Maceration: No Moist: No Periwound Skin Color: Atrophie Blanche: No Atrophie Blanche: No Atrophie Blanche: No Cyanosis: No Cyanosis: No Cyanosis: No Ecchymosis: No Ecchymosis: No Ecchymosis: No Erythema: No Erythema: No Erythema: No Hemosiderin Staining: No Hemosiderin Staining: No Hemosiderin Staining: No Mottled: No Mottled: No Mottled: No Pallor: No Pallor: No Pallor: No Rubor: No Rubor: No Rubor: No Temperature: No Abnormality No Abnormality No Abnormality Tenderness on Yes Yes Yes Palpation: Wound Preparation: Ulcer Cleansing: Ulcer Cleansing: Ulcer Cleansing: Rinsed/Irrigated with Rinsed/Irrigated with Rinsed/Irrigated  with Saline Saline Saline Topical Anesthetic Topical Anesthetic Topical Anesthetic Applied: Other: lidocaine Applied: Other: lidocaine Applied: Other: lidocaine 4% 4% 4% Wound Number: [id number]  Photos: No Photos No Photos No Photos Wound Location: Left, Distal, Posterior Left Lower Leg - Lateral, Left Lower Leg - Lateral, Lower Leg Proximal Distal Wounding Event: Gradually Appeared Gradually Appeared Gradually Appeared Primary Etiology: Calciphylaxis Calciphylaxis Calciphylaxis Comorbid History: N/A Hypertension, Type II Hypertension, Type II Diabetes Diabetes Date Acquired: 01/09/2015 01/09/2015 01/09/2015 Weeks of Treatment: 3 3 3  Wound Status: Healed - Epithelialized Open Open Measurements L x W x D 0x0x0 4x8x0.1 7x3x0.1 (cm) Obeirne, Deryn H. ([id number]) Area (cm) : 0 25.133 16.493 Volume (cm) : 0 2.513 1.649 % Reduction in Area: 100.00% -133.90% -15.10% % Reduction in Volume: 100.00% -134.00% -15.10% Classification: Full Thickness Without Full Thickness Without Full Thickness Without Exposed Support Exposed Support Exposed Support Structures Structures Structures HBO Classification: N/A Grade 2 Grade 2 Exudate Amount: N/A None Present  Small Exudate Type: N/A N/A Serosanguineous Exudate Color: N/A N/A red, brown Wound Margin: N/A Flat and Intact Flat and Intact Granulation Amount: N/A None Present (0%) None Present (0%) Necrotic Amount: N/A Large (67-100%) Large (67-100%) Necrotic Tissue: N/A Eschar, Adherent Slough Eschar, Adherent Slough Exposed Structures: N/A Fascia: No Fascia: No Fat: No Fat: No Tendon: No Tendon: No Muscle: No Muscle: No Joint: No Joint: No Bone: No Bone: No Limited to Skin Limited to Skin Breakdown Breakdown Epithelialization: N/A None None Periwound Skin Texture: No Abnormalities Noted Edema: Yes Edema: Yes Excoriation: No Excoriation: No Induration: No Induration: No Callus: No Callus: No Crepitus: No Crepitus: No Fluctuance:  No Fluctuance: No Friable: No Friable: No Rash: No Rash: No Scarring: No Scarring: No Periwound Skin No Abnormalities Noted Dry/Scaly: Yes Moist: Yes Moisture: Maceration: No Dry/Scaly: Yes Moist: No Maceration: No Periwound Skin Color: No Abnormalities Noted Atrophie Blanche: No Atrophie Blanche: No Cyanosis: No Cyanosis: No Ecchymosis: No Ecchymosis: No Erythema: No Erythema: No Hemosiderin Staining: No Hemosiderin Staining: No Mottled: No Mottled: No Pallor: No Pallor: No Rubor: No Rubor: No Temperature: N/A No Abnormality No Abnormality Tenderness on No Yes Yes Palpation: Wound Preparation: N/A Ulcer Cleansing: Ulcer Cleansing: Rinsed/Irrigated with Rinsed/Irrigated with Saline Saline Mcgriff, Lyan H. ([id number]) Topical Anesthetic Topical Anesthetic Applied: Other: lidocaine Applied: Other: lidocaine 4% 4% Wound Number: 7 N/A N/A Photos: No Photos N/A N/A Wound Location: Left Lower Leg - Medial N/A N/A Wounding Event: Gradually Appeared N/A N/A Primary Etiology: Arterial Insufficiency Ulcer N/A N/A Comorbid History: Hypertension, Type II N/A N/A Diabetes Date Acquired: 01/31/2015 N/A N/A Weeks of Treatment: 2 N/A N/A Wound Status: Open N/A N/A Measurements L x W x D 6.5x3.5x0.1 N/A N/A (cm) Area (cm) : 17.868 N/A N/A Volume (cm) : 1.787 N/A N/A % Reduction in Area: -82.00% N/A N/A % Reduction in Volume: -82.00% N/A N/A Classification: Unclassifiable N/A N/A HBO Classification: Grade 1 N/A N/A Exudate Amount: None Present N/A N/A Exudate Type: N/A N/A N/A Exudate Color: N/A N/A N/A Wound Margin: Distinct, outline attached N/A N/A Granulation Amount: None Present (0%) N/A N/A Necrotic Amount: Large (67-100%) N/A N/A Necrotic Tissue: Eschar N/A N/A Exposed Structures: Fascia: No N/A N/A Fat: No Tendon: No Muscle: No Joint: No Bone: No Limited to Skin Breakdown Epithelialization: None N/A N/A Periwound Skin Texture: Edema: Yes N/A  N/A Excoriation: No Induration: No Callus: No Crepitus: No Fluctuance: No Friable: No Rash: No Scarring: No N/A N/A Falco, Marra H. ([id number]) Periwound Skin Dry/Scaly: Yes Moisture: Maceration: No Moist: No Periwound Skin Color: Atrophie Blanche: No N/A N/A Cyanosis: No Ecchymosis: No Erythema: No Hemosiderin Staining: No Mottled: No Pallor: No Rubor: No Temperature: No Abnormality N/A N/A Tenderness on Yes N/A N/A Palpation: Wound Preparation: Ulcer Cleansing: N/A N/A Rinsed/Irrigated with Saline Topical Anesthetic Applied: Other: lidocaine 4% Treatment Notes Electronic Signature(s) Signed: 02/21/2015 2:57:52 PM By: Elpidio Eric BSN, RN Entered By: Elpidio Eric on 02/21/2015 14:57:52 Hipolito, [name] ([id number]) -------------------------------------------------------------------------------- Multi-Disciplinary Care Plan Details Patient Name: Norfolk, Mailee H. Date of Service: 02/21/2015 2:30 PM Medical Record Number: [id number] Patient Account Number: [id number] Date of Birth/Sex: 1956-01-24 (59 y.o. Female) Treating RN: Clover Mealy, RN, BSN, [place] Sink Primary Care Physician: Fidel Levy Other Clinician: Referring Physician: Fidel Levy Treating Physician/Extender: Rudene Re in Treatment: 3 Active Inactive Orientation to the Wound Care Program Nursing Diagnoses: Knowledge deficit related to the wound healing center program Goals: Patient/caregiver will verbalize understanding of the Wound Healing Center Program Date Initiated: 01/30/2015 Goal  Status: Active Interventions: Provide education on orientation to the wound center Notes: Pain, Acute or Chronic Nursing Diagnoses: Pain, acute or chronic: actual or potential Goals: Patient will verbalize adequate pain control and receive pain control interventions during procedures as needed Date Initiated: 01/30/2015 Goal Status: Active Patient/caregiver will verbalize adequate pain control  between visits Date Initiated: 01/30/2015 Goal Status: Active Interventions: Assess comfort goal upon admission Encourage patient to take pain medications as prescribed Notes: Venous Leg Ulcer Nursing Diagnoses: Strohman, Tesha H. ([id number]) Potential for venous Insuffiency (use before diagnosis confirmed) Goals: Non-invasive venous studies are completed as ordered Date Initiated: 01/30/2015 Goal Status: Active Interventions: Assess peripheral edema status every visit. Notes: Wound/Skin Impairment Nursing Diagnoses: Impaired tissue integrity Goals: Ulcer/skin breakdown will have a volume reduction of 30% by week 4 Date Initiated: 01/30/2015 Goal Status: Active Ulcer/skin breakdown will have a volume reduction of 50% by week 8 Date Initiated: 01/30/2015 Goal Status: Active Ulcer/skin breakdown will have a volume reduction of 80% by week 12 Date Initiated: 01/30/2015 Goal Status: Active Ulcer/skin breakdown will heal within 14 weeks Date Initiated: 01/30/2015 Goal Status: Active Interventions: Assess ulceration(s) every visit Notes: Electronic Signature(s) Signed: 02/21/2015 2:57:40 PM By: Elpidio Eric BSN, RN Entered By: Elpidio Eric on 02/21/2015 14:57:39 Struss, Cassandra H. ([id number]) -------------------------------------------------------------------------------- Pain Assessment Details Patient Name: Lubke, Breaunna H. Date of Service: 02/21/2015 2:30 PM Medical Record Number: [id number] Patient Account Number: [id number] Date of Birth/Sex: Nov 25, 1955 (59 y.o. Female) Treating RN: Clover Mealy, RN, BSN, [place] Sink Primary Care Physician: Fidel Levy Other Clinician: Referring Physician: Fidel Levy Treating Physician/Extender: Rudene Re in Treatment: 3 Active Problems Location of Pain Severity and Description of Pain Patient Has Paino No Site Locations Pain Management and Medication Current Pain Management: Electronic Signature(s) Signed: 02/21/2015 2:33:33  PM By: Elpidio Eric BSN, RN Entered By: Elpidio Eric on 02/21/2015 14:33:33 Affeldt, [name] ([id number]) -------------------------------------------------------------------------------- Patient/Caregiver Education Details Patient Name: Mandile, Jaree H. Date of Service: 02/21/2015 2:30 PM Medical Record Number: [id number] Patient Account Number: [id number] Date of Birth/Gender: 1956/06/10 (59 y.o. Female) Treating RN: Clover Mealy, RN, BSN, [place] Sink Primary Care Physician: Fidel Levy Other Clinician: Referring Physician: Fidel Levy Treating Physician/Extender: Rudene Re in Treatment: 3 Education Assessment Education Provided To: Patient Education Topics Provided Basic Hygiene: Methods: Explain/Verbal Responses: State content correctly Welcome To The [facility]: Methods: Explain/Verbal Responses: State content correctly Electronic Signature(s) Signed: 02/21/2015 3:19:27 PM By: Elpidio Eric BSN, RN Entered By: Elpidio Eric on 02/21/2015 15:19:27 Jenning, Darion Rexene Edison ([id number]) -------------------------------------------------------------------------------- Wound Assessment Details Patient Name: Ogle, Reyonna H. Date of Service: 02/21/2015 2:30 PM Medical Record Number: [id number] Patient Account Number: [id number] Date of Birth/Sex: 1956-05-20 (59 y.o. Female) Treating RN: Afful, RN, BSN, [place] Sink Primary Care Physician: Fidel Levy Other Clinician: Referring Physician: Fidel Levy Treating Physician/Extender: Rudene Re in Treatment: 3 Wound Status Wound Number: 1 Primary Etiology: Calciphylaxis Wound Location: Right Lower Leg - Proximal Wound Status: Open Wounding Event: Gradually Appeared Comorbid History: Hypertension, Type II Diabetes Date Acquired: 01/09/2015 Weeks Of Treatment: 3 Clustered Wound: No Photos Photo Uploaded By: Elpidio Eric on 02/21/2015 16:25:28 Wound Measurements Length: (cm) 1 Width: (cm) 1 Depth: (cm)  0.1 Area: (cm) 0.785 Volume: (cm) 0.079 % Reduction in Area: -1570.2% % Reduction in Volume: -1480% Epithelialization: None Tunneling: No Undermining: No Wound Description Classification: Partial Thickness Foul Diabetic Severity [address]): Grade 1 Wound Margin: Distinct, outline attached Exudate Amount: Small Exudate Type: Serosanguineous Exudate Color: red, brown Odor After Cleansing: No Wound Bed  Granulation Amount: None Present (0%) Exposed Structure Necrotic Amount: Large (67-100%) Fascia Exposed: No Necrotic Quality: Eschar Fat Layer Exposed: No Settle, Damaria H. ([id number]) Tendon Exposed: No Muscle Exposed: No Joint Exposed: No Bone Exposed: No Limited to Skin Breakdown Periwound Skin Texture Texture Color No Abnormalities Noted: No No Abnormalities Noted: No Callus: No Atrophie Blanche: No Crepitus: No Cyanosis: No Excoriation: No Ecchymosis: No Fluctuance: No Erythema: No Friable: No Hemosiderin Staining: No Induration: No Mottled: No Localized Edema: Yes Pallor: No Rash: No Rubor: No Scarring: No Temperature / Pain Moisture Temperature: No Abnormality No Abnormalities Noted: No Tenderness on Palpation: Yes Dry / Scaly: Yes Maceration: No Moist: Yes Wound Preparation Ulcer Cleansing: Rinsed/Irrigated with Saline Topical Anesthetic Applied: Other: lidocaine 4%, Treatment Notes Wound #1 (Right, Proximal Lower Leg) 1. Cleansed with: Clean wound with Normal Saline 2. Anesthetic Topical Lidocaine 4% cream to wound bed prior to debridement 4. Dressing Applied: Hydrogel 5. Secondary Dressing Applied Kerlix/Conform Non-Adherent pad 7. Secured with Tape Notes hydrogel AG Electronic Signature(s) Signed: 02/21/2015 2:51:00 PM By: Elpidio Eric BSN, RN Kalas, Andrea Rexene Edison ([id number]) Entered By: Elpidio Eric on 02/21/2015 14:51:00 Jarmon, [name]  ([id number]) -------------------------------------------------------------------------------- Wound Assessment Details Patient Name: Presswood, Athanasia H. Date of Service: 02/21/2015 2:30 PM Medical Record Number: [id number] Patient Account Number: [id number] Date of Birth/Sex: 19-Nov-1955 (59 y.o. Female) Treating RN: Afful, RN, BSN, [place] Sink Primary Care Physician: Fidel Levy Other Clinician: Referring Physician: Fidel Levy Treating Physician/Extender: Rudene Re in Treatment: 3 Wound Status Wound Number: 2 Primary Etiology: Calciphylaxis Wound Location: Right Lower Leg - Distal Wound Status: Open Wounding Event: Gradually Appeared Comorbid History: Hypertension, Type II Diabetes Date Acquired: 01/09/2015 Weeks Of Treatment: 3 Clustered Wound: No Photos Photo Uploaded By: Elpidio Eric on 02/21/2015 16:25:45 Wound Measurements Length: (cm) 1 Width: (cm) 1.2 Depth: (cm) 0.2 Area: (cm) 0.942 Volume: (cm) 0.188 % Reduction in Area: -902.1% % Reduction in Volume: -1988.9% Epithelialization: None Tunneling: No Undermining: No Wound Description Classification: Partial Thickness Foul Diabetic Severity ([place]): Grade 1 Wound Margin: Distinct, outline attached Exudate Amount: Small Exudate Type: Serosanguineous Exudate Color: red, brown Odor After Cleansing: No Wound Bed Granulation Amount: None Present (0%) Exposed Structure Necrotic Amount: Large (67-100%) Fascia Exposed: No Necrotic Quality: Eschar Fat Layer Exposed: No Shilling, Farrie H. ([id number]) Tendon Exposed: No Muscle Exposed: No Joint Exposed: No Bone Exposed: No Limited to Skin Breakdown Periwound Skin Texture Texture Color No Abnormalities Noted: No No Abnormalities Noted: No Callus: No Atrophie Blanche: No Crepitus: No Cyanosis: No Excoriation: No Ecchymosis: No Fluctuance: No Erythema: No Friable: No Hemosiderin Staining: No Induration: No Mottled: No Localized Edema:  Yes Pallor: No Rash: No Rubor: No Scarring: No Temperature / Pain Moisture Temperature: No Abnormality No Abnormalities Noted: No Tenderness on Palpation: Yes Dry / Scaly: Yes Maceration: No Moist: Yes Wound Preparation Ulcer Cleansing: Rinsed/Irrigated with Saline Topical Anesthetic Applied: Other: lidocaine 4%, Treatment Notes Wound #2 (Right, Distal Lower Leg) 1. Cleansed with: Clean wound with Normal Saline 2. Anesthetic Topical Lidocaine 4% cream to wound bed prior to debridement 4. Dressing Applied: Hydrogel 5. Secondary Dressing Applied Kerlix/Conform Non-Adherent pad 7. Secured with Tape Notes hydrogel AG Electronic Signature(s) Signed: 02/21/2015 2:51:45 PM By: Elpidio Eric BSN, RN Hornaday, Rossie Rexene Edison ([id number]) Entered By: Elpidio Eric on 02/21/2015 14:51:45 Jia, [name] ([id number]) -------------------------------------------------------------------------------- Wound Assessment Details Patient Name: Soliman, Diona H. Date of Service: 02/21/2015 2:30 PM Medical Record Number: [id number] Patient Account Number: [id number] Date of Birth/Sex: August 09, 1955 (59 y.o. Female) Treating  RN: Clover Mealy, RN, BSN, Rita Primary Care Physician: Fidel Levy Other Clinician: Referring Physician: Fidel Levy Treating Physician/Extender: Rudene Re in Treatment: 3 Wound Status Wound Number: 3 Primary Etiology: Calciphylaxis Wound Location: Left Lower Leg - Posterior, Wound Status: Open Proximal Comorbid History: Hypertension, Type II Diabetes Wounding Event: Gradually Appeared Date Acquired: 01/09/2015 Weeks Of Treatment: 3 Clustered Wound: No Photos Photo Uploaded By: Elpidio Eric on 02/21/2015 16:26:24 Wound Measurements Length: (cm) 3.7 % Reduction in Width: (cm) 4 % Reduction in Depth: (cm) 0.1 Epithelializat Area: (cm) 11.624 Tunneling: Volume: (cm) 1.162 Undermining: Area: -5.7% Volume: -5.6% ion: None No No Wound  Description Full Thickness Without Classification: Exposed Support Structures Diabetic Severity Grade 2 (Wagner): Wound Margin: Flat and Intact Exudate Amount: None Present Foul Odor After Cleansing: No Wound Bed Granulation Amount: None Present (0%) Exposed Structure Necrotic Amount: Large (67-100%) Fascia Exposed: No Necrotic Quality: Eschar, Adherent Slough Fat Layer Exposed: No Gerken, Quinnie H. ([id number]) Tendon Exposed: No Muscle Exposed: No Joint Exposed: No Bone Exposed: No Limited to Skin Breakdown Periwound Skin Texture Texture Color No Abnormalities Noted: No No Abnormalities Noted: No Callus: No Atrophie Blanche: No Crepitus: No Cyanosis: No Excoriation: No Ecchymosis: No Fluctuance: No Erythema: No Friable: No Hemosiderin Staining: No Induration: No Mottled: No Localized Edema: Yes Pallor: No Rash: No Rubor: No Scarring: No Temperature / Pain Moisture Temperature: No Abnormality No Abnormalities Noted: No Tenderness on Palpation: Yes Dry / Scaly: Yes Maceration: No Moist: No Wound Preparation Ulcer Cleansing: Rinsed/Irrigated with Saline Topical Anesthetic Applied: Other: lidocaine 4%, Treatment Notes Wound #3 (Left, Proximal, Posterior Lower Leg) 1. Cleansed with: Clean wound with Normal Saline 2. Anesthetic Topical Lidocaine 4% cream to wound bed prior to debridement 4. Dressing Applied: Hydrogel 5. Secondary Dressing Applied Kerlix/Conform Non-Adherent pad 7. Secured with Tape Notes hydrogel AG Electronic Signature(s) Signed: 02/21/2015 2:53:25 PM By: Elpidio Eric BSN, RN Previous Signature: 02/21/2015 2:52:08 PM Version By: Elpidio Eric BSN, RN Quesada, Deane Rexene Edison ([id number]) Entered By: Elpidio Eric on 02/21/2015 14:53:25 Calma, Tonique Rexene Edison ([id number]) -------------------------------------------------------------------------------- Wound Assessment Details Patient Name: Cichy, Armanie H. Date of Service: 02/21/2015 2:30  PM Medical Record Number: [id number] Patient Account Number: [id number] Date of Birth/Sex: [birth year] (59 y.o. Female) Treating RN: Afful, RN, BSN, [place] Sink Primary Care Physician: Fidel Levy Other Clinician: Referring Physician: Fidel Levy Treating Physician/Extender: Rudene Re in Treatment: 3 Wound Status Wound Number: 4 Primary Etiology: Calciphylaxis Wound Location: Left, Distal, Posterior Lower Wound Status: Healed - Epithelialized Leg Wounding Event: Gradually Appeared Date Acquired: 01/09/2015 Weeks Of Treatment: 3 Clustered Wound: No Photos Photo Uploaded By: Elpidio Eric on 02/21/2015 16:26:49 Wound Measurements Length: (cm) 0 % Reducti Width: (cm) 0 % Reducti Depth: (cm) 0 Area: (cm) 0 Volume: (cm) 0 on in Area: 100% on in Volume: 100% Wound Description Full Thickness Without Exposed Classification: Support Structures Periwound Skin Texture Texture Color No Abnormalities Noted: No No Abnormalities Noted: No Moisture No Abnormalities Noted: No Electronic Signature(s) SADONNA, KOTARA ([id number]) Signed: 02/21/2015 3:59:33 PM By: Elpidio Eric BSN, RN Entered By: Elpidio Eric on 02/21/2015 14:46:05 Ferber, Grecia H. ([id number]) -------------------------------------------------------------------------------- Wound Assessment Details Patient Name: Beswick, Magin H. Date of Service: 02/21/2015 2:30 PM Medical Record Number: [id number] Patient Account Number: [id number] Date of Birth/Sex: 24-Oct-1955 (59 y.o. Female) Treating RN: Clover Mealy, RN, BSN, [place] Sink Primary Care Physician: Fidel Levy Other Clinician: Referring Physician: Fidel Levy Treating Physician/Extender: Rudene Re in Treatment: 3 Wound Status Wound Number: 5 Primary Etiology: Calciphylaxis  Wound Location: Left Lower Leg - Lateral, Wound Status: Open Proximal Comorbid History: Hypertension, Type II Diabetes Wounding Event: Gradually Appeared Date  Acquired: 01/09/2015 Weeks Of Treatment: 3 Clustered Wound: No Photos Photo Uploaded By: Elpidio Eric on 02/21/2015 16:27:16 Wound Measurements Length: (cm) 4 Width: (cm) 8 Depth: (cm) 0.1 Area: (cm) 25.133 Volume: (cm) 2.513 % Reduction in Area: -133.9% % Reduction in Volume: -134% Epithelialization: None Tunneling: No Undermining: No Wound Description Full Thickness Without Classification: Exposed Support Structures Diabetic Severity Grade 2 (Wagner): Wound Margin: Flat and Intact Exudate Amount: None Present Foul Odor After Cleansing: No Wound Bed Granulation Amount: None Present (0%) Exposed Structure Necrotic Amount: Large (67-100%) Fascia Exposed: No Necrotic Quality: Eschar, Adherent Slough Fat Layer Exposed: No Rochette, Lameka H. ([id number]) Tendon Exposed: No Muscle Exposed: No Joint Exposed: No Bone Exposed: No Limited to Skin Breakdown Periwound Skin Texture Texture Color No Abnormalities Noted: No No Abnormalities Noted: No Callus: No Atrophie Blanche: No Crepitus: No Cyanosis: No Excoriation: No Ecchymosis: No Fluctuance: No Erythema: No Friable: No Hemosiderin Staining: No Induration: No Mottled: No Localized Edema: Yes Pallor: No Rash: No Rubor: No Scarring: No Temperature / Pain Moisture Temperature: No Abnormality No Abnormalities Noted: No Tenderness on Palpation: Yes Dry / Scaly: Yes Maceration: No Moist: No Wound Preparation Ulcer Cleansing: Rinsed/Irrigated with Saline Topical Anesthetic Applied: Other: lidocaine 4%, Treatment Notes Wound #5 (Left, Proximal, Lateral Lower Leg) 1. Cleansed with: Clean wound with Normal Saline 2. Anesthetic Topical Lidocaine 4% cream to wound bed prior to debridement 4. Dressing Applied: Hydrogel 5. Secondary Dressing Applied Kerlix/Conform Non-Adherent pad 7. Secured with Tape Notes hydrogel AG Electronic Signature(s) Signed: 02/21/2015 2:54:02 PM By: Elpidio Eric BSN,  RN Venturella, Jerlene Rexene Edison ([id number]) Entered By: Elpidio Eric on 02/21/2015 14:54:01 Cibrian, [name] ([id number]) -------------------------------------------------------------------------------- Wound Assessment Details Patient Name: Cerutti, Mikalyn H. Date of Service: 02/21/2015 2:30 PM Medical Record Number: [id number] Patient Account Number: [id number] Date of Birth/Sex: 10-14-1955 (59 y.o. Female) Treating RN: Afful, RN, BSN, [place] Sink Primary Care Physician: Fidel Levy Other Clinician: Referring Physician: Fidel Levy Treating Physician/Extender: Rudene Re in Treatment: 3 Wound Status Wound Number: 6 Primary Etiology: Calciphylaxis Wound Location: Left Lower Leg - Lateral, Distal Wound Status: Open Wounding Event: Gradually Appeared Comorbid History: Hypertension, Type II Diabetes Date Acquired: 01/09/2015 Weeks Of Treatment: 3 Clustered Wound: No Photos Photo Uploaded By: Elpidio Eric on 02/21/2015 16:27:45 Wound Measurements Length: (cm) 7 Width: (cm) 3 Depth: (cm) 0.1 Area: (cm) 16.493 Volume: (cm) 1.649 % Reduction in Area: -15.1% % Reduction in Volume: -15.1% Epithelialization: None Tunneling: No Undermining: No Wound Description Full Thickness Without Exposed Foul Odor Classification: Support Structures Diabetic Severity Grade 2 (Wagner): Wound Margin: Flat and Intact Exudate Amount: Small Exudate Type: Serosanguineous Exudate Color: red, brown After Cleansing: No Wound Bed Granulation Amount: None Present (0%) Exposed Structure Necrotic Amount: Large (67-100%) Fascia Exposed: No Svehla, Barbee H. ([id number]) Necrotic Quality: Eschar, Adherent Slough Fat Layer Exposed: No Tendon Exposed: No Muscle Exposed: No Joint Exposed: No Bone Exposed: No Limited to Skin Breakdown Periwound Skin Texture Texture Color No Abnormalities Noted: No No Abnormalities Noted: No Callus: No Atrophie Blanche: No Crepitus: No Cyanosis:  No Excoriation: No Ecchymosis: No Fluctuance: No Erythema: No Friable: No Hemosiderin Staining: No Induration: No Mottled: No Localized Edema: Yes Pallor: No Rash: No Rubor: No Scarring: No Temperature / Pain Moisture Temperature: No Abnormality No Abnormalities Noted: No Tenderness on Palpation: Yes Dry / Scaly: Yes Maceration: No Moist: Yes Wound Preparation  Ulcer Cleansing: Rinsed/Irrigated with Saline Topical Anesthetic Applied: Other: lidocaine 4%, Treatment Notes Wound #6 (Left, Distal, Lateral Lower Leg) 1. Cleansed with: Clean wound with Normal Saline 2. Anesthetic Topical Lidocaine 4% cream to wound bed prior to debridement 4. Dressing Applied: Hydrogel 5. Secondary Dressing Applied Kerlix/Conform Non-Adherent pad 7. Secured with Tape Notes hydrogel AG Electronic Signature(s) YASMINA, CHICO ([id number]) Signed: 02/21/2015 2:55:28 PM By: Elpidio Eric BSN, RN Entered By: Elpidio Eric on 02/21/2015 14:55:28 Arvidson, Sanna Rexene Edison ([id number]) -------------------------------------------------------------------------------- Wound Assessment Details Patient Name: Coulon, Geanna H. Date of Service: 02/21/2015 2:30 PM Medical Record Number: [id number] Patient Account Number: [id number] Date of Birth/Sex: 05/06/1956 (59 y.o. Female) Treating RN: Afful, RN, BSN, [place] Sink Primary Care Physician: Fidel Levy Other Clinician: Referring Physician: Fidel Levy Treating Physician/Extender: Rudene Re in Treatment: 3 Wound Status Wound Number: 7 Primary Etiology: Arterial Insufficiency Ulcer Wound Location: Left Lower Leg - Medial Wound Status: Open Wounding Event: Gradually Appeared Comorbid History: Hypertension, Type II Diabetes Date Acquired: 01/31/2015 Weeks Of Treatment: 2 Clustered Wound: No Photos Photo Uploaded By: Elpidio Eric on 02/21/2015 16:28:06 Wound Measurements Length: (cm) 6.5 Width: (cm) 3.5 Depth: (cm) 0.1 Area: (cm)  17.868 Volume: (cm) 1.787 % Reduction in Area: -82% % Reduction in Volume: -82% Epithelialization: None Tunneling: No Undermining: No Wound Description Classification: Unclassifiable Foul O Diabetic Severity (Wagner): Grade 1 Wound Margin: Distinct, outline attached Exudate Amount: None Present dor After Cleansing: No Wound Bed Granulation Amount: None Present (0%) Exposed Structure Necrotic Amount: Large (67-100%) Fascia Exposed: No Necrotic Quality: Eschar Fat Layer Exposed: No Tendon Exposed: No Muscle Exposed: No Fuhr, Breuna H. ([id number]) Joint Exposed: No Bone Exposed: No Limited to Skin Breakdown Periwound Skin Texture Texture Color No Abnormalities Noted: No No Abnormalities Noted: No Callus: No Atrophie Blanche: No Crepitus: No Cyanosis: No Excoriation: No Ecchymosis: No Fluctuance: No Erythema: No Friable: No Hemosiderin Staining: No Induration: No Mottled: No Localized Edema: Yes Pallor: No Rash: No Rubor: No Scarring: No Temperature / Pain Moisture Temperature: No Abnormality No Abnormalities Noted: No Tenderness on Palpation: Yes Dry / Scaly: Yes Maceration: No Moist: No Wound Preparation Ulcer Cleansing: Rinsed/Irrigated with Saline Topical Anesthetic Applied: Other: lidocaine 4%, Treatment Notes Wound #7 (Left, Medial Lower Leg) 1. Cleansed with: Clean wound with Normal Saline 2. Anesthetic Topical Lidocaine 4% cream to wound bed prior to debridement 4. Dressing Applied: Hydrogel 5. Secondary Dressing Applied Kerlix/Conform Non-Adherent pad 7. Secured with Tape Notes hydrogel AG Electronic Signature(s) Signed: 02/21/2015 2:57:21 PM By: Elpidio Eric BSN, RN Entered By: Elpidio Eric on 02/21/2015 14:57:20 Kief, [name] ([id number]) Nagy, Tocara Rexene Edison ([id number]) -------------------------------------------------------------------------------- Vitals Details Patient Name: Wombles, Rex H. Date of Service: 02/21/2015  2:30 PM Medical Record Number: [id number] Patient Account Number: [id number] Date of Birth/Sex: 16-Oct-1955 (59 y.o. Female) Treating RN: Afful, RN, BSN, [place] Sink Primary Care Physician: Fidel Levy Other Clinician: Referring Physician: Fidel Levy Treating Physician/Extender: Rudene Re in Treatment: 3 Vital Signs Time Taken: 14:33 Temperature (F): 98.4 Height (in): 68 Pulse (bpm): 85 Weight (lbs): 294 Respiratory Rate (breaths/min): 18 Body Mass Index (BMI): 44.7 Blood Pressure (mmHg): 119/64 Reference Range: 80 - 120 mg / dl Electronic Signature(s) Signed: 02/21/2015 2:33:55 PM By: Elpidio Eric BSN, RN Entered By: Elpidio Eric on 02/21/2015 14:33:55

## 2015-02-22 NOTE — Progress Notes (Signed)
[name], [name] ([id number]) Visit Report for 02/21/2015 Chief Complaint Document Details Patient Name: [name], [name]. Date of Service: 02/21/2015 2:30 PM Medical Record Number: [id number] Patient Account Number: [id number] Date of Birth/Sex: 17-Dec-1955 (59 y.o. Female) Treating RN: Primary Care Physician: Fidel Levy Other Clinician: Referring Physician: Fidel Levy Treating Physician/Extender: Rudene Re in Treatment: 3 Information Obtained from: Patient Chief Complaint Patient presents to the wound care center for a consult due non healing wound. This 59 year old patient has had ulcerated areas on the left lower extremity more than the right lower extremity to very painful for the last 3 weeks. Electronic Signature(s) Signed: 02/21/2015 3:18:57 PM By: Evlyn Kanner MD, FACS Entered By: Evlyn Kanner on 02/21/2015 15:18:57 Roscher, [name] ([id number]) -------------------------------------------------------------------------------- HPI Details Patient Name: [name], [name] Date of Service: 02/21/2015 2:30 PM Medical Record Number: [id number] Patient Account Number: [id number] Date of Birth/Sex: 01-19-1956 (59 y.o. Female) Treating RN: Primary Care Physician: Fidel Levy Other Clinician: Referring Physician: Fidel Levy Treating Physician/Extender: Rudene Re in Treatment: 3 History of Present Illness Location: Painful ulcerated areas left lower extremity more than right lower extremity Quality: Patient reports experiencing a sharp pain to affected area(s). Severity: Patient states wound are getting worse. Duration: Patient has had the wound for < 4 weeks prior to presenting for treatment Timing: Pain in wound is Intermittent (comes and goes Context: The wound appeared gradually over time Modifying Factors: Other treatment(s) tried include: she has had Keflex for about 10 days. Associated Signs and Symptoms: Patient reports  having difficulty standing for long periods. HPI Description: 59 year old patient recently seen by her PCP Dr. Venora Maples who saw her on 01/25/2015 4 ulcers both lower extremities which have been present for about 3 weeks. She took a ten-day course of Keflex and continues to have leg pain and swelling. Past medical history significant for chronic kidney disease, hypertension and diabetes mellitus without complications. She has never been a smoker. Mostly recent CBC was within normal limits, BMP showed that her glucose was 136 and a beard was 45 and a creatinine is 4.19. The patient has had chronic renal failure and had a left AV fistula placed by Dr. Wyn Quaker in May and she is getting ready for hemodialysis soon. She has not been put on any specific medications for this problem. Addendum: tried to get Dr. 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 all questions answered. 02/07/2015 -- the patient started hemodialysis yesterday but was not successful and is going to be seeing Dr. Wyn Quaker regain. She also has a vascular workup for early August. No further investigations or referrals have been made after my conversation with her PCPs office last week. I have asked the patient to call her PCPs office and discuss further investigations and referrals. 02/14/2015 -- she has had 2 sessions of hemodialysis via the right IJ dialysis catheter. She has not seen her PCP or Dr. Wyn Quaker yet. Part of a vascular workup was done today and the next is sometime later in the month. Unfortunately she does not agree to any surgical intervention at this stage and I have not been able to refer her to a surgeon for debridement in the OR. 02/21/2015 -- she has not decided to proceed with surgery yet and is going to see her PCP tomorrow for more  discussions. I have offered  to talk to her specialists at any time and she does understand that. Electronic Signature(s) Signed: 02/21/2015 3:19:51 PM By: Evlyn Kanner MD, FACS Entered By: Evlyn Kanner on 02/21/2015 15:19:51 Ging, [name] ([id number]) -------------------------------------------------------------------------------- Physical Exam Details Patient Name: [name], [name] Date of Service: 02/21/2015 2:30 PM Medical Record Number: [id number] Patient Account Number: [id number] Date of Birth/Sex: [birth year] (59 y.o. Female) Treating RN: Primary Care Physician: Fidel Levy Other Clinician: Referring Physician: Fidel Levy Treating Physician/Extender: Rudene Re in Treatment: 3 Constitutional . Pulse regular. Respirations normal and unlabored. Afebrile. . Eyes Nonicteric. Reactive to light. Ears, Nose, Mouth, and Throat Lips, teeth, and gums WNL.Marland Kitchen Moist mucosa without lesions . Neck supple and nontender. No palpable supraclavicular or cervical adenopathy. Normal sized without goiter. Respiratory WNL. No retractions.. Cardiovascular Pedal Pulses WNL. No clubbing, cyanosis or edema. Chest Breasts symmetical and no nipple discharge.. Breast tissue WNL, no masses, lumps, or tenderness.. Lymphatic No adneopathy. No adenopathy. No adenopathy. Musculoskeletal Adexa without tenderness or enlargement.. Digits and nails w/o clubbing, cyanosis, infection, petechiae, ischemia, or inflammatory conditions.. Integumentary (Hair, Skin) No suspicious lesions. No crepitus or fluctuance. No peri-wound warmth or erythema. No masses.Marland Kitchen Psychiatric Judgement and insight Intact.. No evidence of depression, anxiety, or agitation.. Notes The eschar has not changed in any way and there is no cellulitis. She will need sharp debridement in the operating room but she is not agreeable. Electronic Signature(s) Signed: 02/21/2015 3:20:32 PM By: Evlyn Kanner MD, FACS Entered By: Evlyn Kanner on  02/21/2015 15:20:31 Predmore, [name] ([id number]) -------------------------------------------------------------------------------- Physician Orders Details Patient Name: [name], [name] Date of Service: 02/21/2015 2:30 PM Medical Record Patient Account Number: [id number] Number: Afful, RN, BSN, Treating RN: 03-Oct-1955 (58 y.o. [place] Date of Birth/Sex: Female) Other Clinician: Primary Care Physician: Jonna Clark, JAMES Treating Evlyn Kanner Referring Physician: Fidel Levy Physician/Extender: Tania Ade in Treatment: 3 Verbal / Phone Orders: Yes Clinician: Afful, RN, BSN, Rita Read Back and Verified: Yes Diagnosis Coding Wound Cleansing Wound #1 Right,Proximal Lower Leg o Clean wound with Normal Saline. o May Shower, gently pat wound dry prior to applying new dressing. Wound #2 Right,Distal Lower Leg o Clean wound with Normal Saline. o May Shower, gently pat wound dry prior to applying new dressing. Wound #3 Left,Proximal,Posterior Lower Leg o Clean wound with Normal Saline. o May Shower, gently pat wound dry prior to applying new dressing. Wound #5 Left,Proximal,Lateral Lower Leg o Clean wound with Normal Saline. o May Shower, gently pat wound dry prior to applying new dressing. Wound #6 Left,Distal,Lateral Lower Leg o Clean wound with Normal Saline. o May Shower, gently pat wound dry prior to applying new dressing. Wound #7 Left,Medial Lower Leg o Cleanse wound with mild soap and water o May Shower, gently pat wound dry prior to applying new dressing. Anesthetic Wound #1 Right,Proximal Lower Leg o Topical Lidocaine 4% cream applied to wound bed prior to debridement Wound #2 Right,Distal Lower Leg o Topical Lidocaine 4% cream applied to wound bed prior to debridement Wound #3 Left,Proximal,Posterior Lower Leg o Topical Lidocaine 4% cream applied to wound bed prior to debridement Wound #5 Left,Proximal,Lateral Lower  Leg [name], [name] ([id number]) o Topical Lidocaine 4% cream applied to wound bed prior to debridement Wound #6 Left,Distal,Lateral Lower Leg o Topical Lidocaine 4% cream applied to wound bed prior to debridement Wound #7 Left,Medial Lower Leg o Topical Lidocaine 4% cream applied to wound bed prior to debridement Primary Wound Dressing Wound #1 Right,Proximal  Lower Leg o Hydrogel - with Ag Wound #2 Right,Distal Lower Leg o Hydrogel - with Ag Wound #3 Left,Proximal,Posterior Lower Leg o Hydrogel - with Ag Wound #5 Left,Proximal,Lateral Lower Leg o Hydrogel - with Ag Wound #6 Left,Distal,Lateral Lower Leg o Hydrogel - with Ag Wound #7 Left,Medial Lower Leg o Hydrogel - with Ag Secondary Dressing Wound #1 Right,Proximal Lower Leg o Gauze, ABD and Kerlix/Conform Wound #2 Right,Distal Lower Leg o Gauze, ABD and Kerlix/Conform Wound #3 Left,Proximal,Posterior Lower Leg o Gauze, ABD and Kerlix/Conform Wound #5 Left,Proximal,Lateral Lower Leg o Gauze, ABD and Kerlix/Conform Wound #6 Left,Distal,Lateral Lower Leg o Gauze, ABD and Kerlix/Conform Wound #7 Left,Medial Lower Leg o ABD and Kerlix/Conform Dressing Change Frequency Wound #1 Right,Proximal Lower Leg Aderhold, Carlise H. ([id number]) o Change dressing every day. Wound #2 Right,Distal Lower Leg o Change dressing every day. Wound #3 Left,Proximal,Posterior Lower Leg o Change dressing every day. Wound #5 Left,Proximal,Lateral Lower Leg o Change dressing every day. Wound #6 Left,Distal,Lateral Lower Leg o Change dressing every day. Wound #7 Left,Medial Lower Leg o Change dressing every day. Follow-up Appointments Wound #1 Right,Proximal Lower Leg o Return Appointment in 1 week. Wound #2 Right,Distal Lower Leg o Return Appointment in 1 week. Wound #3 Left,Proximal,Posterior Lower Leg o Return Appointment in 1 week. Wound #5 Left,Proximal,Lateral Lower Leg o Return  Appointment in 1 week. Wound #6 Left,Distal,Lateral Lower Leg o Return Appointment in 1 week. Wound #7 Left,Medial Lower Leg o Return Appointment in 1 week. Edema Control Wound #1 Right,Proximal Lower Leg o Tubigrip Wound #2 Right,Distal Lower Leg o Tubigrip Wound #3 Left,Proximal,Posterior Lower Leg o Tubigrip Wound #5 Left,Proximal,Lateral Lower Leg o Tubigrip [name], [name] ([id number]) Wound #6 Left,Distal,Lateral Lower Leg o Tubigrip Wound #7 Left,Medial Lower Leg o Tubigrip Home Health Wound #1 Right,Proximal Lower Leg o Initiate Home Health for Skilled Nursing o Home Health Nurse may visit PRN to address patientos wound care needs. o FACE TO FACE ENCOUNTER: MEDICARE and MEDICAID PATIENTS: I certify that this patient is under my care and that I had a face-to-face encounter that meets the physician face-to-face encounter requirements with this patient on this date. The encounter with the patient was in whole or in part for the following MEDICAL CONDITION: (primary reason for Home Healthcare) MEDICAL NECESSITY: I certify, that based on my findings, NURSING services are a medically necessary home health service. HOME BOUND STATUS: I certify that my clinical findings support that this patient is homebound (i.e., Due to illness or injury, pt requires aid of supportive devices such as crutches, cane, wheelchairs, walkers, the use of special transportation or the assistance of another person to leave their place of residence. There is a normal inability to leave the home and doing so requires considerable and taxing effort. Other absences are for medical reasons / religious services and are infrequent or of short duration when for other reasons). o If current dressing causes regression in wound condition, may D/C ordered dressing product/s and apply Normal Saline Moist Dressing daily until next Wound Healing Center / Other MD appointment. Notify Wound  Healing Center of regression in wound condition at [phone]. o Please direct any NON-WOUND related issues/requests for orders to patient's Primary Care Physician Wound #2 Right,Distal Lower Leg o Initiate Home Health for Skilled Nursing o Home Health Nurse may visit PRN to address patientos wound care needs. o FACE TO FACE ENCOUNTER: MEDICARE and MEDICAID PATIENTS: I certify that this patient is under my care and that I had a face-to-face encounter that  meets the physician face-to-face encounter requirements with this patient on this date. The encounter with the patient was in whole or in part for the following MEDICAL CONDITION: (primary reason for Home Healthcare) MEDICAL NECESSITY: I certify, that based on my findings, NURSING services are a medically necessary home health service. HOME BOUND STATUS: I certify that my clinical findings support that this patient is homebound (i.e., Due to illness or injury, pt requires aid of supportive devices such as crutches, cane, wheelchairs, walkers, the use of special transportation or the assistance of another person to leave their place of residence. There is a normal inability to leave the home and doing so requires considerable and taxing effort. Other absences are for medical reasons / religious services and are infrequent or of short duration when for other reasons). o If current dressing causes regression in wound condition, may D/C ordered dressing product/s and apply Normal Saline Moist Dressing daily until next Wound Healing Center / Other MD appointment. Notify Wound Healing Center of regression in wound condition at [phone]. o Please direct any NON-WOUND related issues/requests for orders to patient's Primary Care Physician [name], [name] ([id number]) Wound #3 Left,Proximal,Posterior Lower Leg o Initiate Home Health for Skilled Nursing o Home Health Nurse may visit PRN to address patientos wound care  needs. o FACE TO FACE ENCOUNTER: MEDICARE and MEDICAID PATIENTS: I certify that this patient is under my care and that I had a face-to-face encounter that meets the physician face-to-face encounter requirements with this patient on this date. The encounter with the patient was in whole or in part for the following MEDICAL CONDITION: (primary reason for Home Healthcare) MEDICAL NECESSITY: I certify, that based on my findings, NURSING services are a medically necessary home health service. HOME BOUND STATUS: I certify that my clinical findings support that this patient is homebound (i.e., Due to illness or injury, pt requires aid of supportive devices such as crutches, cane, wheelchairs, walkers, the use of special transportation or the assistance of another person to leave their place of residence. There is a normal inability to leave the home and doing so requires considerable and taxing effort. Other absences are for medical reasons / religious services and are infrequent or of short duration when for other reasons). o If current dressing causes regression in wound condition, may D/C ordered dressing product/s and apply Normal Saline Moist Dressing daily until next Wound Healing Center / Other MD appointment. Notify Wound Healing Center of regression in wound condition at [phone]. o Please direct any NON-WOUND related issues/requests for orders to patient's Primary Care Physician Wound #5 Left,Proximal,Lateral Lower Leg o Initiate Home Health for Skilled Nursing o Home Health Nurse may visit PRN to address patientos wound care needs. o FACE TO FACE ENCOUNTER: MEDICARE and MEDICAID PATIENTS: I certify that this patient is under my care and that I had a face-to-face encounter that meets the physician face-to-face encounter requirements with this patient on this date. The encounter with the patient was in whole or in part for the following MEDICAL CONDITION: (primary reason for  Home Healthcare) MEDICAL NECESSITY: I certify, that based on my findings, NURSING services are a medically necessary home health service. HOME BOUND STATUS: I certify that my clinical findings support that this patient is homebound (i.e., Due to illness or injury, pt requires aid of supportive devices such as crutches, cane, wheelchairs, walkers, the use of special transportation or the assistance of another person to leave their place of residence. There is a normal inability  to leave the home and doing so requires considerable and taxing effort. Other absences are for medical reasons / religious services and are infrequent or of short duration when for other reasons). o If current dressing causes regression in wound condition, may D/C ordered dressing product/s and apply Normal Saline Moist Dressing daily until next Wound Healing Center / Other MD appointment. Notify Wound Healing Center of regression in wound condition at [phone]. o Please direct any NON-WOUND related issues/requests for orders to patient's Primary Care Physician Wound #6 Left,Distal,Lateral Lower Leg o Initiate Home Health for Skilled Nursing o Home Health Nurse may visit PRN to address patientos wound care needs. o FACE TO FACE ENCOUNTER: MEDICARE and MEDICAID PATIENTS: I certify that this patient is under my care and that I had a face-to-face encounter that meets the physician face-to-face encounter requirements with this patient on this date. The encounter with the patient was in whole or in part for the following MEDICAL CONDITION: (primary reason for Home Healthcare) [name], [name] ([id number]) MEDICAL NECESSITY: I certify, that based on my findings, NURSING services are a medically necessary home health service. HOME BOUND STATUS: I certify that my clinical findings support that this patient is homebound (i.e., Due to illness or injury, pt requires aid of supportive devices such as crutches,  cane, wheelchairs, walkers, the use of special transportation or the assistance of another person to leave their place of residence. There is a normal inability to leave the home and doing so requires considerable and taxing effort. Other absences are for medical reasons / religious services and are infrequent or of short duration when for other reasons). o If current dressing causes regression in wound condition, may D/C ordered dressing product/s and apply Normal Saline Moist Dressing daily until next Wound Healing Center / Other MD appointment. Notify Wound Healing Center of regression in wound condition at [phone]. o Please direct any NON-WOUND related issues/requests for orders to patient's Primary Care Physician Electronic Signature(s) Signed: 02/21/2015 3:16:59 PM By: Elpidio Eric BSN, RN Signed: 02/21/2015 4:14:41 PM By: Evlyn Kanner MD, FACS Previous Signature: 02/21/2015 3:02:03 PM Version By: Elpidio Eric BSN, RN Entered By: Elpidio Eric on 02/21/2015 15:16:56 [place], Abbigael Rexene [name] ([id number]) -------------------------------------------------------------------------------- Problem List Details Patient Name: [name], [name] Date of Service: 02/21/2015 2:30 PM Medical Record Number: [id number] Patient Account Number: [id number] Date of Birth/Sex: 1956/01/27 (59 y.o. 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 Date of Service: 02/21/2015 2:30 PM Medical Record Number: [id number] Patient Account Number: [id number] Date of Birth/Sex: 09-01-1955 (59 y.o. Female) Treating RN: Primary Care Physician: Fidel Levy Other Clinician: Referring Physician: Fidel Levy Treating Physician/Extender: Rudene Re in Treatment: 3 Subjective Chief Complaint Information obtained from Patient Patient presents to the wound care center for a consult due non healing wound. This 59 year old patient has had ulcerated areas on the left lower extremity more  than the right lower extremity to very painful for the last 3 weeks. History of Present Illness (HPI) The following HPI elements were documented for the patient's wound: Location: Painful ulcerated areas left lower extremity more than right lower extremity Quality: Patient reports experiencing a sharp pain to affected area(s). Severity: Patient states wound are getting worse. Duration: Patient has had the wound for < 4 weeks prior to presenting for treatment Timing: Pain in wound is Intermittent (comes and goes Context: The wound appeared gradually over time Modifying Factors: Other treatment(s) tried include: she has had Keflex for about 10 days. Associated Signs and Symptoms: Patient reports having difficulty standing for long periods. 59 year old patient recently seen by her PCP Dr. Venora Maples who saw her on 01/25/2015 4 ulcers both lower extremities which have been present for about 3 weeks. She took a ten-day course of Keflex and continues to have leg pain and swelling. Past medical history significant for chronic kidney disease, hypertension and diabetes  mellitus without complications. She has never been a smoker. Mostly recent CBC was within normal limits, BMP showed that her glucose was 136 and a beard was 45 and a creatinine is 4.19. The patient has had chronic renal failure and had a left AV fistula placed by Dr. Wyn Quaker in May and she is getting ready for hemodialysis soon. She has not been put on any specific medications for this problem. Addendum: tried to get Dr. 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 all questions answered. 02/07/2015 -- the patient started hemodialysis yesterday but was not successful and is going to be seeing Dr. Wyn Quaker regain. She also has a vascular workup for early August. No further investigations or referrals have been made after my conversation with her PCPs office last week. I have asked the patient to call her PCPs office and discuss further investigations and referrals. 02/14/2015 -- she has had 2 sessions of hemodialysis via the right IJ dialysis catheter. She has not seen her PCP or Dr. Wyn Quaker yet. Part of a vascular workup was done today and the next is sometime later in the month. Unfortunately she does not agree to any surgical intervention at this stage and I have not been able to refer her to a surgeon for debridement in the OR. 02/21/2015 -- she has not decided to proceed with surgery yet and is going to see her PCP tomorrow for [facility], Maresa H. ([id number]) more discussions. I have offered to talk to her specialists at any time and she does understand that. Objective Constitutional Pulse regular. Respirations normal and unlabored. Afebrile. Vitals Time Taken: 2:33 PM, Height: 68 in, Weight: 294 lbs, BMI: 44.7, Temperature: 98.4 F, Pulse: 85 bpm, Respiratory Rate: 18 breaths/min, Blood Pressure: 119/64 mmHg. Eyes Nonicteric. Reactive to  light. Ears, Nose, Mouth, and Throat Lips, teeth, and gums WNL.Marland Kitchen Moist mucosa without lesions . Neck supple and nontender. No palpable supraclavicular or cervical adenopathy. Normal sized without goiter. Respiratory WNL. No retractions.. Cardiovascular Pedal Pulses WNL. No clubbing, cyanosis or edema. Chest Breasts symmetical and no nipple discharge.. Breast tissue WNL, no masses, lumps, or tenderness.. Lymphatic No adneopathy. No adenopathy. No adenopathy. Musculoskeletal Adexa without tenderness or enlargement.. Digits and nails w/o clubbing, cyanosis, infection, petechiae, ischemia, or inflammatory conditions.Marland Kitchen Psychiatric Judgement and insight Intact.. No evidence of depression, anxiety, or agitation.. General Notes:  The eschar has not changed in any way and there is no cellulitis. She will need sharp debridement in the operating room but she is not agreeable. Integumentary (Hair, Skin) No suspicious lesions. No crepitus or fluctuance. No peri-wound warmth or erythema. No masses.Marland Kitchen Shonk, Livian H. ([id number]) Wound #1 status is Open. Original cause of wound was Gradually Appeared. The wound is located on the Right,Proximal Lower Leg. The wound measures 1cm length x 1cm width x 0.1cm depth; 0.785cm^2 area and 0.079cm^3 volume. The wound is limited to skin breakdown. There is no tunneling or undermining noted. There is a small amount of serosanguineous drainage noted. The wound margin is distinct with the outline attached to the wound base. There is no granulation within the wound bed. There is a large (67- 100%) amount of necrotic tissue within the wound bed including Eschar. The periwound skin appearance exhibited: Localized Edema, Dry/Scaly, Moist. The periwound skin appearance did not exhibit: Callus, Crepitus, Excoriation, Fluctuance, Friable, Induration, Rash, Scarring, Maceration, Atrophie Blanche, Cyanosis, Ecchymosis, Hemosiderin Staining, Mottled, Pallor, Rubor,  Erythema. Periwound temperature was noted as No Abnormality. The periwound has tenderness on palpation. Wound #2 status is Open. Original cause of wound was Gradually Appeared. The wound is located on the Right,Distal Lower Leg. The wound measures 1cm length x 1.2cm width x 0.2cm depth; 0.942cm^2 area and 0.188cm^3 volume. The wound is limited to skin breakdown. There is no tunneling or undermining noted. There is a small amount of serosanguineous drainage noted. The wound margin is distinct with the outline attached to the wound base. There is no granulation within the wound bed. There is a large (67- 100%) amount of necrotic tissue within the wound bed including Eschar. The periwound skin appearance exhibited: Localized Edema, Dry/Scaly, Moist. The periwound skin appearance did not exhibit: Callus, Crepitus, Excoriation, Fluctuance, Friable, Induration, Rash, Scarring, Maceration, Atrophie Blanche, Cyanosis, Ecchymosis, Hemosiderin Staining, Mottled, Pallor, Rubor, Erythema. Periwound temperature was noted as No Abnormality. The periwound has tenderness on palpation. Wound #3 status is Open. Original cause of wound was Gradually Appeared. The wound is located on the Left,Proximal,Posterior Lower Leg. The wound measures 3.7cm length x 4cm width x 0.1cm depth; 11.624cm^2 area and 1.162cm^3 volume. The wound is limited to skin breakdown. There is no tunneling or undermining noted. There is a none present amount of drainage noted. The wound margin is flat and intact. There is no granulation within the wound bed. There is a large (67-100%) amount of necrotic tissue within the wound bed including Eschar and Adherent Slough. The periwound skin appearance exhibited: Localized Edema, Dry/Scaly. The periwound skin appearance did not exhibit: Callus, Crepitus, Excoriation, Fluctuance, Friable, Induration, Rash, Scarring, Maceration, Moist, Atrophie Blanche, Cyanosis, Ecchymosis, Hemosiderin Staining,  Mottled, Pallor, Rubor, Erythema. Periwound temperature was noted as No Abnormality. The periwound has tenderness on palpation. Wound #4 status is Healed - Epithelialized. Original cause of wound was Gradually Appeared. The wound is located on the Left,Distal,Posterior Lower Leg. The wound measures 0cm length x 0cm width x 0cm depth; 0cm^2 area and 0cm^3 volume. Wound #5 status is Open. Original cause of wound was Gradually Appeared. The wound is located on the Left,Proximal,Lateral Lower Leg. The wound measures 4cm length x 8cm width x 0.1cm depth; 25.133cm^2 area and 2.513cm^3 volume. The wound is limited to skin breakdown. There is no tunneling or undermining noted. There is a none present amount of drainage noted. The wound margin is flat and intact. There is no granulation within the wound bed. There is a large (67-100%)  amount of necrotic tissue within the wound bed including Eschar and Adherent Slough. The periwound skin appearance exhibited: Localized Edema, Dry/Scaly. The periwound skin appearance did not exhibit: Callus, Crepitus, Excoriation, Fluctuance, Friable, Induration, Rash, Scarring, Maceration, Moist, Atrophie Blanche, Cyanosis, Ecchymosis, Hemosiderin Staining, Mottled, Pallor, Rubor, Erythema. Periwound temperature was noted as No Abnormality. The periwound has tenderness on palpation. Wound #6 status is Open. Original cause of wound was Gradually Appeared. The wound is located on the Left,Distal,Lateral Lower Leg. The wound measures 7cm length x 3cm width x 0.1cm depth; 16.493cm^2 Renzulli, Makai H. ([id number]) area and 1.649cm^3 volume. The wound is limited to skin breakdown. There is no tunneling or undermining noted. There is a small amount of serosanguineous drainage noted. The wound margin is flat and intact. There is no granulation within the wound bed. There is a large (67-100%) amount of necrotic tissue within the wound bed including Eschar and Adherent Slough. The  periwound skin appearance exhibited: Localized Edema, Dry/Scaly, Moist. The periwound skin appearance did not exhibit: Callus, Crepitus, Excoriation, Fluctuance, Friable, Induration, Rash, Scarring, Maceration, Atrophie Blanche, Cyanosis, Ecchymosis, Hemosiderin Staining, Mottled, Pallor, Rubor, Erythema. Periwound temperature was noted as No Abnormality. The periwound has tenderness on palpation. Wound #7 status is Open. Original cause of wound was Gradually Appeared. The wound is located on the Left,Medial Lower Leg. The wound measures 6.5cm length x 3.5cm width x 0.1cm depth; 17.868cm^2 area and 1.787cm^3 volume. The wound is limited to skin breakdown. There is no tunneling or undermining noted. There is a none present amount of drainage noted. The wound margin is distinct with the outline attached to the wound base. There is no granulation within the wound bed. There is a large (67-100%) amount of necrotic tissue within the wound bed including Eschar. The periwound skin appearance exhibited: Localized Edema, Dry/Scaly. The periwound skin appearance did not exhibit: Callus, Crepitus, Excoriation, Fluctuance, Friable, Induration, Rash, Scarring, Maceration, Moist, Atrophie Blanche, Cyanosis, Ecchymosis, Hemosiderin Staining, Mottled, Pallor, Rubor, Erythema. Periwound temperature was noted as No Abnormality. The periwound has tenderness on palpation. 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 She will see her PCP for further discussions and will continue conservative therapy for now.I will see her back next week. Plan Wound Cleansing: Wound #1  Right,Proximal Lower Leg: Clean wound with Normal Saline. May Shower, gently pat wound dry prior to applying new dressing. Wound #2 Right,Distal Lower Leg: Fines, Kenise H. ([id number]) Clean wound with Normal Saline. May Shower, gently pat wound dry prior to applying new dressing. Wound #3 Left,Proximal,Posterior Lower Leg: Clean wound with Normal Saline. May Shower, gently pat wound dry prior to applying new dressing. Wound #5 Left,Proximal,Lateral Lower Leg: Clean wound with Normal Saline. May Shower, gently pat wound dry prior to applying new dressing. Wound #6 Left,Distal,Lateral Lower Leg: Clean wound with Normal Saline. May Shower, gently pat wound dry prior to applying new dressing. Wound #7 Left,Medial Lower Leg: Cleanse wound with mild soap and water May Shower, gently pat wound dry prior to applying new dressing. Anesthetic: Wound #1 Right,Proximal Lower Leg: Topical Lidocaine 4% cream applied to wound bed prior to debridement Wound #2 Right,Distal Lower Leg: Topical Lidocaine 4% cream applied to wound  bed prior to debridement Wound #3 Left,Proximal,Posterior Lower Leg: Topical Lidocaine 4% cream applied to wound bed prior to debridement Wound #5 Left,Proximal,Lateral Lower Leg: Topical Lidocaine 4% cream applied to wound bed prior to debridement Wound #6 Left,Distal,Lateral Lower Leg: Topical Lidocaine 4% cream applied to wound bed prior to debridement Wound #7 Left,Medial Lower Leg: Topical Lidocaine 4% cream applied to wound bed prior to debridement Primary Wound Dressing: Wound #1 Right,Proximal Lower Leg: Hydrogel - with Ag Wound #2 Right,Distal Lower Leg: Hydrogel - with Ag Wound #3 Left,Proximal,Posterior Lower Leg: Hydrogel - with Ag Wound #5 Left,Proximal,Lateral Lower Leg: Hydrogel - with Ag Wound #6 Left,Distal,Lateral Lower Leg: Hydrogel - with Ag Wound #7 Left,Medial Lower Leg: Hydrogel - with Ag Secondary Dressing: Wound #1 Right,Proximal Lower  Leg: Gauze, ABD and Kerlix/Conform Wound #2 Right,Distal Lower Leg: Gauze, ABD and Kerlix/Conform Wound #3 Left,Proximal,Posterior Lower Leg: Gauze, ABD and Kerlix/Conform Wound #5 Left,Proximal,Lateral Lower Leg: Gauze, ABD and Kerlix/Conform Wound #6 Left,Distal,Lateral Lower Leg: Gauze, ABD and Kerlix/Conform Grob, Infinity H. ([id number]) Wound #7 Left,Medial Lower Leg: ABD and Kerlix/Conform Dressing Change Frequency: Wound #1 Right,Proximal Lower Leg: Change dressing every day. Wound #2 Right,Distal Lower Leg: Change dressing every day. Wound #3 Left,Proximal,Posterior Lower Leg: Change dressing every day. Wound #5 Left,Proximal,Lateral Lower Leg: Change dressing every day. Wound #6 Left,Distal,Lateral Lower Leg: Change dressing every day. Wound #7 Left,Medial Lower Leg: Change dressing every day. Follow-up Appointments: Wound #1 Right,Proximal Lower Leg: Return Appointment in 1 week. Wound #2 Right,Distal Lower Leg: Return Appointment in 1 week. Wound #3 Left,Proximal,Posterior Lower Leg: Return Appointment in 1 week. Wound #5 Left,Proximal,Lateral Lower Leg: Return Appointment in 1 week. Wound #6 Left,Distal,Lateral Lower Leg: Return Appointment in 1 week. Wound #7 Left,Medial Lower Leg: Return Appointment in 1 week. Edema Control: Wound #1 Right,Proximal Lower Leg: Tubigrip Wound #2 Right,Distal Lower Leg: Tubigrip Wound #3 Left,Proximal,Posterior Lower Leg: Tubigrip Wound #5 Left,Proximal,Lateral Lower Leg: Tubigrip Wound #6 Left,Distal,Lateral Lower Leg: Tubigrip Wound #7 Left,Medial Lower Leg: Tubigrip Home Health: Wound #1 Right,Proximal Lower Leg: Initiate Home Health for Skilled Nursing Home Health Nurse may visit PRN to address patient s wound care needs. FACE TO FACE ENCOUNTER: MEDICARE and MEDICAID PATIENTS: I certify that this patient is under my care and that I had a face-to-face encounter that meets the physician face-to-face  encounter requirements with this patient on this date. The encounter with the patient was in whole or in part for the following MEDICAL CONDITION: (primary reason for Home Healthcare) MEDICAL NECESSITY: I certify, that based on my findings, NURSING services are a medically necessary home health service. HOME BOUND STATUS: I certify that my clinical findings support that this patient is homebound (i.e., Due to [facility], JENISSE VULLO. ([id number]) illness or injury, pt requires aid of supportive devices such as crutches, cane, wheelchairs, walkers, the use of special transportation or the assistance of another person to leave their place of residence. There is a normal inability to leave the home and doing so requires considerable and taxing effort. Other absences are for medical reasons / religious services and are infrequent or of short duration when for other reasons). If current dressing causes regression in wound condition, may D/C ordered dressing product/s and apply Normal Saline Moist Dressing daily until next Wound Healing Center / Other MD appointment. Notify Wound Healing Center of regression in wound condition at [phone]. Please direct any NON-WOUND related issues/requests for orders to patient's Primary Care Physician Wound #2 Right,Distal Lower Leg: Initiate Home Health for Skilled Nursing  Home Health Nurse may visit PRN to address patient s wound care needs. FACE TO FACE ENCOUNTER: MEDICARE and MEDICAID PATIENTS: I certify that this patient is under my care and that I had a face-to-face encounter that meets the physician face-to-face encounter requirements with this patient on this date. The encounter with the patient was in whole or in part for the following MEDICAL CONDITION: (primary reason for Home Healthcare) MEDICAL NECESSITY: I certify, that based on my findings, NURSING services are a medically necessary home health service. HOME BOUND STATUS: I certify that my clinical  findings support that this patient is homebound (i.e., Due to illness or injury, pt requires aid of supportive devices such as crutches, cane, wheelchairs, walkers, the use of special transportation or the assistance of another person to leave their place of residence. There is a normal inability to leave the home and doing so requires considerable and taxing effort. Other absences are for medical reasons / religious services and are infrequent or of short duration when for other reasons). If current dressing causes regression in wound condition, may D/C ordered dressing product/s and apply Normal Saline Moist Dressing daily until next Wound Healing Center / Other MD appointment. Notify Wound Healing Center of regression in wound condition at [phone]. Please direct any NON-WOUND related issues/requests for orders to patient's Primary Care Physician Wound #3 Left,Proximal,Posterior Lower Leg: Initiate Home Health for Skilled Nursing Home Health Nurse may visit PRN to address patient s wound care needs. FACE TO FACE ENCOUNTER: MEDICARE and MEDICAID PATIENTS: I certify that this patient is under my care and that I had a face-to-face encounter that meets the physician face-to-face encounter requirements with this patient on this date. The encounter with the patient was in whole or in part for the following MEDICAL CONDITION: (primary reason for Home Healthcare) MEDICAL NECESSITY: I certify, that based on my findings, NURSING services are a medically necessary home health service. HOME BOUND STATUS: I certify that my clinical findings support that this patient is homebound (i.e., Due to illness or injury, pt requires aid of supportive devices such as crutches, cane, wheelchairs, walkers, the use of special transportation or the assistance of another person to leave their place of residence. There is a normal inability to leave the home and doing so requires considerable and taxing effort. Other  absences are for medical reasons / religious services and are infrequent or of short duration when for other reasons). If current dressing causes regression in wound condition, may D/C ordered dressing product/s and apply Normal Saline Moist Dressing daily until next Wound Healing Center / Other MD appointment. Notify Wound Healing Center of regression in wound condition at [phone]. Please direct any NON-WOUND related issues/requests for orders to patient's Primary Care Physician Wound #5 Left,Proximal,Lateral Lower Leg: Initiate Home Health for Skilled Nursing Home Health Nurse may visit PRN to address patient s wound care needs. FACE TO FACE ENCOUNTER: MEDICARE and MEDICAID PATIENTS: I certify that this patient is under my care and that I had a face-to-face encounter that meets the physician face-to-face encounter requirements with this patient on this date. The encounter with the patient was in whole or in part for the following MEDICAL CONDITION: (primary reason for Home Healthcare) MEDICAL NECESSITY: I certify, that based on my findings, NURSING services are a medically necessary home health service. HOME BOUND STATUS: I certify that my clinical findings support that this patient is homebound (i.e., Due to [facility], Mela H. ([id number]) illness or injury, pt requires aid of  supportive devices such as crutches, cane, wheelchairs, walkers, the use of special transportation or the assistance of another person to leave their place of residence. There is a normal inability to leave the home and doing so requires considerable and taxing effort. Other absences are for medical reasons / religious services and are infrequent or of short duration when for other reasons). If current dressing causes regression in wound condition, may D/C ordered dressing product/s and apply Normal Saline Moist Dressing daily until next Wound Healing Center / Other MD appointment. Notify Wound Healing Center of  regression in wound condition at [phone]. Please direct any NON-WOUND related issues/requests for orders to patient's Primary Care Physician Wound #6 Left,Distal,Lateral Lower Leg: Initiate Home Health for Skilled Nursing Home Health Nurse may visit PRN to address patient s wound care needs. FACE TO FACE ENCOUNTER: MEDICARE and MEDICAID PATIENTS: I certify that this patient is under my care and that I had a face-to-face encounter that meets the physician face-to-face encounter requirements with this patient on this date. The encounter with the patient was in whole or in part for the following MEDICAL CONDITION: (primary reason for Home Healthcare) MEDICAL NECESSITY: I certify, that based on my findings, NURSING services are a medically necessary home health service. HOME BOUND STATUS: I certify that my clinical findings support that this patient is homebound (i.e., Due to illness or injury, pt requires aid of supportive devices such as crutches, cane, wheelchairs, walkers, the use of special transportation or the assistance of another person to leave their place of residence. There is a normal inability to leave the home and doing so requires considerable and taxing effort. Other absences are for medical reasons / religious services and are infrequent or of short duration when for other reasons). If current dressing causes regression in wound condition, may D/C ordered dressing product/s and apply Normal Saline Moist Dressing daily until next Wound Healing Center / Other MD appointment. Notify Wound Healing Center of regression in wound condition at [phone]. Please direct any NON-WOUND related issues/requests for orders to patient's Primary Care Physician I was discussing with her again regarding the need for surgical debridement but she is not willing. She will see her PCP for further discussions and will continue conservative therapy for now.I will see her back next week. Electronic  Signature(s) Signed: 02/21/2015 3:21:33 PM By: Evlyn Kanner MD, FACS Entered By: Evlyn Kanner on 02/21/2015 15:21:32 Lingenfelter, [name] ([id number]) -------------------------------------------------------------------------------- SuperBill Details Patient Name: Kiang, Malik H. Date of Service: 02/21/2015 Medical Record Patient Account Number: [id number] Number: Afful, RN, BSN, Treating RN: [birth year] (58 y.o. 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 Facility Procedures CPT4 Code: [id number] Description: [phone] - WOUND CARE VISIT-LEV 5 EST PT Modifier: Quantity: 1 Physician Procedures CPT4 Code: [id number] Description: [id number] - WC PHYS LEVEL 3 - EST PT ICD-10 Description Diagnosis E11.622 Type 2 diabetes mellitus with other skin ulcer L97.212 Non-pressure chronic ulcer of right calf with fat L97.222 Non-pressure chronic ulcer of left calf with fat Modifier: layer exposed layer exposed Quantity: 1 Electronic Signature(s) Signed: 02/21/2015 3:22:01 PM By: Evlyn Kanner MD, FACS Previous Signature: 02/21/2015 3:18:05 PM  Version By: Elpidio Eric BSN, RN Entered By: Evlyn Kanner on 02/21/2015 15:22:01

## 2015-02-23 ENCOUNTER — Ambulatory Visit (INDEPENDENT_AMBULATORY_CARE_PROVIDER_SITE_OTHER): Payer: BC Managed Care – PPO | Admitting: Family Medicine

## 2015-02-23 ENCOUNTER — Encounter: Payer: Self-pay | Admitting: Family Medicine

## 2015-02-23 VITALS — BP 105/70 | HR 72 | Temp 98.7°F | Resp 16 | Ht 68.0 in | Wt 260.0 lb

## 2015-02-23 DIAGNOSIS — N185 Chronic kidney disease, stage 5: Secondary | ICD-10-CM

## 2015-02-23 DIAGNOSIS — I10 Essential (primary) hypertension: Secondary | ICD-10-CM | POA: Diagnosis not present

## 2015-02-23 DIAGNOSIS — J0140 Acute pansinusitis, unspecified: Secondary | ICD-10-CM | POA: Insufficient documentation

## 2015-02-23 DIAGNOSIS — E119 Type 2 diabetes mellitus without complications: Secondary | ICD-10-CM

## 2015-02-23 DIAGNOSIS — Z23 Encounter for immunization: Secondary | ICD-10-CM | POA: Insufficient documentation

## 2015-02-23 DIAGNOSIS — N184 Chronic kidney disease, stage 4 (severe): Secondary | ICD-10-CM | POA: Insufficient documentation

## 2015-02-23 DIAGNOSIS — Z0000 Encounter for general adult medical examination without abnormal findings: Secondary | ICD-10-CM | POA: Insufficient documentation

## 2015-02-23 LAB — POCT GLYCOSYLATED HEMOGLOBIN (HGB A1C): Hemoglobin A1C: 7.4

## 2015-02-23 MED ORDER — NOVOLOG FLEXPEN 100 UNIT/ML ~~LOC~~ SOPN: 5.0000 [IU] | PEN_INJECTOR | Freq: Three times a day (TID) | SUBCUTANEOUS | Status: DC

## 2015-02-23 NOTE — Patient Instructions (Signed)
Continue other meds as taking except as changed below

## 2015-02-23 NOTE — Progress Notes (Signed)
Name: [name]   MRN: [id number]    DOB: 07-09-1956   Date:02/23/2015       Progress Note  Subjective  Chief Complaint  Chief Complaint  Patient presents with  . Diabetes  . Chronic Renal Failure    HPI  For f/u of DM and chronic renal failure.  Has HBP.  Not taking mealtime insulin regularly (missing more than taking)..  Skips some meals.  BSs 120-220.   No problem-specific assessment & plan notes found for this encounter.   Past Medical History  Diagnosis Date  . Chronic kidney disease   . Hypertension   . Diabetes mellitus without complication     Social History  Substance Use Topics  . Smoking status: Never Smoker   . Smokeless tobacco: Never Used  . Alcohol Use: No     Current outpatient prescriptions:  .  amLODipine (NORVASC) 10 MG tablet, Take 5 mg by mouth daily., Disp: , Rfl:  .  calcitRIOL (ROCALTROL) 0.5 MCG capsule, Take 0.5 mcg by mouth daily., Disp: , Rfl:  .  carvedilol (COREG) 3.125 MG tablet, Take 3.125 mg by mouth 2 (two) times daily with a meal. Taking Tuesday Thursday Saturday and sunday, Disp: , Rfl:  .  FREESTYLE LITE test strip, , Disp: , Rfl:  .  furosemide (LASIX) 80 MG tablet, Taking Tuesday Thursday Saturday and sunday, Disp: , Rfl:  .  hydrALAZINE (APRESOLINE) 25 MG tablet, Take 25 mg by mouth 3 (three) times daily. Not taking Monday Wednesday Friday morning or noon, Disp: , Rfl:  .  LEVEMIR FLEXTOUCH 100 UNIT/ML Pen, , Disp: , Rfl:  .  loratadine (CLARITIN) 10 MG tablet, Take 10 mg by mouth daily., Disp: , Rfl:  .  LORazepam (ATIVAN) 0.5 MG tablet, Take 1 tablet (0.5 mg total) by mouth at bedtime., Disp: 20 tablet, Rfl: 0 .  losartan (COZAAR) 50 MG tablet, Take 50 mg by mouth daily., Disp: , Rfl:  .  NOVOLOG FLEXPEN 100 UNIT/ML FlexPen, , Disp: , Rfl:  .  ondansetron (ZOFRAN) 4 MG tablet, , Disp: , Rfl:  .  HYDROcodone-acetaminophen (NORCO/VICODIN) 5-325 MG per tablet, , Disp: , Rfl:  .  oxyCODONE-acetaminophen (ROXICET) 5-325 MG  per tablet, Take 1 tablet by mouth every 8 (eight) hours as needed for severe pain. (Patient not taking: Reported on 02/23/2015), Disp: 20 tablet, Rfl: 0 .  SODIUM BICARBONATE PO, Take 20 g by mouth 2 (two) times daily., Disp: , Rfl:  .  traMADol (ULTRAM) 50 MG tablet, , Disp: , Rfl:  .  Vitamin D, Ergocalciferol, (DRISDOL) 50000 UNITS CAPS capsule, Take 50,000 Units by mouth every 7 (seven) days. Saturday, Disp: , Rfl:   Allergies  Allergen Reactions  . Codeine Nausea Only    hydrocodone  . Vicodin [Hydrocodone-Acetaminophen] Nausea And Vomiting    Review of Systems  Constitutional: Negative for fever, chills, weight loss and malaise/fatigue.  HENT: Negative for hearing loss.   Eyes: Negative for blurred vision and double vision.  Respiratory: Negative for cough, sputum production, shortness of breath and wheezing.   Cardiovascular: Positive for leg swelling. Negative for chest pain, palpitations and orthopnea.  Gastrointestinal: Positive for abdominal pain. Negative for heartburn, nausea, vomiting, diarrhea and blood in stool.  Genitourinary: Negative for dysuria, urgency and frequency.  Skin: Negative for rash.  Neurological: Negative for dizziness, sensory change, focal weakness, weakness and headaches.  Psychiatric/Behavioral: Positive for depression. The patient is not nervous/anxious.       Objective  Filed  Vitals:   02/23/15 0841  BP: 105/70  Pulse: 72  Temp: 98.7 F (37.1 C)  TempSrc: Oral  Resp: 16  Height:  (1.727 m)  Weight: 260 lb (117.935 kg)     Physical Exam  Constitutional: She is well-developed, well-nourished, and in no distress. No distress.  HENT:  Head: Normocephalic and atraumatic.  Neck: Normal range of motion. Neck supple. Carotid bruit is not present. No thyromegaly present.  Cardiovascular: Normal rate, regular rhythm and intact distal pulses.  Exam reveals no gallop and no friction rub.   Murmur heard.  Systolic murmur is present with a  grade of 3/6  throughout  Pulmonary/Chest: Effort normal and breath sounds normal. No respiratory distress. She has no wheezes. She has no rales.  Abdominal: Soft. Bowel sounds are normal. She exhibits no distension, no abdominal bruit and no mass. There is no tenderness.  Musculoskeletal: She exhibits edema (trace pedal edema bilaterally.).  Lymphadenopathy:    She has no cervical adenopathy.  Skin:  Skin ulcers of bilateral lowe extremities healing slowly.  Vitals reviewed.     Recent Results (from the past 2160 hour(s))  Potassium     Status: None   Collection Time: 02/10/15  2:41 PM  Result Value Ref Range   Potassium 4.1 3.5 - 5.1 mmol/L     Assessment & Plan  There are no diagnoses linked to this encounter. 1. Type 2 diabetes mellitus without complication  - POCT HgB A1C -7.4 - NOVOLOG FLEXPEN 100 UNIT/ML FlexPen; Inject 5 Units into the skin 3 (three) times daily with meals.  Dispense: 15 mL; Refill: 12 (dose of Novolog, 5 units AM, 6 units midday, 7 units supper) 2. CKD (chronic kidney disease) stage 5, GFR less than 15 ml/min -Continue dialysis.  3. Essential hypertension -stop Amlodipine.

## 2015-03-03 ENCOUNTER — Ambulatory Visit: Payer: BC Managed Care – PPO | Admitting: Surgery

## 2015-03-07 ENCOUNTER — Other Ambulatory Visit
Admission: RE | Admit: 2015-03-07 | Discharge: 2015-03-07 | Disposition: A | Discharge status: Home / Self Care | Payer: BC Managed Care – PPO | Source: Other Acute Inpatient Hospital | Attending: Surgery | Admitting: Surgery

## 2015-03-07 ENCOUNTER — Encounter: Payer: BC Managed Care – PPO | Admitting: Surgery

## 2015-03-07 DIAGNOSIS — S81802S Unspecified open wound, left lower leg, sequela: Secondary | ICD-10-CM | POA: Insufficient documentation

## 2015-03-07 DIAGNOSIS — X58XXXS Exposure to other specified factors, sequela: Secondary | ICD-10-CM | POA: Diagnosis not present

## 2015-03-07 DIAGNOSIS — L97222 Non-pressure chronic ulcer of left calf with fat layer exposed: Secondary | ICD-10-CM | POA: Diagnosis not present

## 2015-03-07 NOTE — Progress Notes (Addendum)
[name] ([id number]) Visit Report for 03/07/2015 Chief Complaint Document Details Patient Name: [name], [name] 03/07/2015 9:30 Date of Service: AM Medical Record [id number] Number: Patient Account Number: [id number] 1955/10/28 (59 y.o. Treating RN: Curtis Sites Date of Birth/Sex: Female) Other Clinician: Primary Care Physician: Roger Shelter Referring Physician: Fidel Levy Physician/Extender: Tania Ade in Treatment: 5 Information Obtained from: Patient Chief Complaint Patient presents to the wound care center for a consult due non healing wound. This 59 year old patient has had ulcerated areas on the left lower extremity more than the right lower extremity to very painful for the last 3 weeks. Electronic Signature(s) Signed: 03/07/2015 10:27:56 AM By: Evlyn Kanner MD, FACS Entered By: Evlyn Kanner on 03/07/2015 10:27:55 [name] ([id number]) -------------------------------------------------------------------------------- HPI Details Patient Name: [name], [name] 03/07/2015 9:30 Date of Service: AM Medical Record [id number] Number: Patient Account Number: [id number] 06-21-1956 (59 y.o. Treating RN: Curtis Sites Date of Birth/Sex: Female) Other Clinician: Primary Care Physician: Roger Shelter Referring Physician: Fidel Levy Physician/Extender: Tania Ade in Treatment: 5 History of Present Illness Location: Painful ulcerated areas left lower extremity more than right lower extremity Quality: Patient reports experiencing a sharp pain to affected area(s). Severity: Patient states wound are getting worse. Duration: Patient has had the wound for < 4 weeks prior to presenting for treatment Timing: Pain in wound is Intermittent (comes and goes Context: The wound appeared gradually over time Modifying Factors: Other treatment(s) tried include: she has had Keflex for about 10  days. Associated Signs and Symptoms: Patient reports having difficulty standing for long periods. HPI Description: 59 year old patient recently seen by her PCP Dr. Venora Maples who saw her on 01/25/2015 4 ulcers both lower extremities which have been present for about 3 weeks. She took a ten-day course of Keflex and continues to have leg pain and swelling. Past medical history significant for chronic kidney disease, hypertension and diabetes mellitus without complications. She has never been a smoker. Mostly recent CBC was within normal limits, BMP showed that her glucose was 136 and a beard was 45 and a creatinine is 4.19. The patient has had chronic renal failure and had a left AV fistula placed by Dr. Wyn Quaker in May and she is getting ready for hemodialysis soon. She has not been put on any specific medications for this problem. Addendum: tried to get Dr. 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 all questions answered. 02/07/2015 -- the patient started hemodialysis yesterday but was not successful and is going to be seeing Dr. Wyn Quaker regain. She also has a vascular workup for early August. No further investigations or referrals have been made after my conversation with her PCPs office last week. I have asked the patient to call her PCPs office and discuss further investigations and referrals. 02/14/2015 -- she has had 2 sessions of hemodialysis via the right IJ dialysis catheter. She has not seen her PCP or Dr. Wyn Quaker yet. Part of a vascular workup was done today and the next is sometime later in the month. Unfortunately she does not agree to any surgical intervention at this stage and I have not been able to refer her to a surgeon for debridement in the OR. 02/21/2015 -- she has not decided to proceed with surgery yet and is going to see  her PCP tomorrow for more discussions. I have offered to talk to her specialists at any time and she does understand that. 03/07/15 -- she has come to see as back after 2 weeks and says that she can only come once in 2 weeks because of social issues. This time around she agrees to see Dr. dew and discuss surgical intervention and I will put in a call to him. Addendum: I got to speak to Dr. Festus Barren who kindly agreed to take her up to the OR for a debridement and punch biopsies. He will also work on her peripheral vascular workup to see if any angioplasty will help. [name] ([id number]) Electronic Signature(s) Signed: 03/07/2015 10:48:33 AM By: Evlyn Kanner MD, FACS Previous Signature: 03/07/2015 10:28:57 AM Version By: Evlyn Kanner MD, FACS Entered By: Evlyn Kanner on 03/07/2015 10:48:33 [name], [name] ([id number]) -------------------------------------------------------------------------------- Physical Exam Details Patient Name: [name], [name] 03/07/2015 9:30 Date of Service: AM Medical Record [id number] Number: Patient Account Number: [id number] 1955/07/17 (59 y.o. Treating RN: Curtis Sites Date of Birth/Sex: Female) Other Clinician: Primary Care Physician: Roger Shelter Referring Physician: Fidel Levy Physician/Extender: Weeks in Treatment: 5 Constitutional . Pulse regular. Respirations normal and unlabored. Afebrile. . Eyes Nonicteric. Reactive to light. Ears, Nose, Mouth, and Throat Lips, teeth, and gums WNL.Marland Kitchen Moist mucosa without lesions . Neck supple and nontender. No palpable supraclavicular or cervical adenopathy. Normal sized without goiter. Respiratory WNL. No retractions.. Cardiovascular Pedal Pulses WNL. No clubbing, cyanosis or edema. Chest Breasts symmetical and no nipple discharge.. Breast tissue WNL, no masses, lumps, or tenderness.. Lymphatic No adneopathy. No adenopathy. No  adenopathy. Musculoskeletal Adexa without tenderness or enlargement.. Digits and nails w/o clubbing, cyanosis, infection, petechiae, ischemia, or inflammatory conditions.. Integumentary (Hair, Skin) No suspicious lesions. No crepitus or fluctuance. No peri-wound warmth or erythema. No masses.Marland Kitchen Psychiatric Judgement and insight Intact.. No evidence of depression, anxiety, or agitation.. Notes She continues to have significant eschar and there is an element of cellulitis surrounding the eschars. The right leg has minimal eschars but the left leg is quite extensive. Electronic Signature(s) Signed: 03/07/2015 10:33:19 AM By: Evlyn Kanner MD, FACS Entered By: Evlyn Kanner on 03/07/2015 10:33:17 [name], [name] ([id number]) -------------------------------------------------------------------------------- Physician Orders Details Patient Name: [name], [name] 03/07/2015 9:30 Date of Service: AM Medical Record [id number] Number: Patient Account Number: [id number] 06-10-1956 (58 y.o. Treating RN: Curtis Sites Date of Birth/Sex: Female) Other Clinician: Primary Care Physician: Roger Shelter Referring Physician: Fidel Levy Physician/Extender: Tania Ade in Treatment: 5 Verbal / Phone Orders: Yes Clinician: Curtis Sites Read Back and Verified: Yes Diagnosis Coding Wound Cleansing Wound #1 Right,Proximal Lower Leg o Clean wound with Normal Saline. o May Shower, gently pat wound dry prior to applying new dressing. Wound #2 Right,Distal Lower Leg o Clean wound with Normal Saline. o May Shower, gently pat wound dry prior to applying new dressing. Wound #3 Left,Lateral Lower Leg o Clean wound with Normal Saline. o May Shower, gently pat wound dry prior to applying new dressing. Anesthetic Wound #1 Right,Proximal Lower Leg o Topical Lidocaine 4% cream applied to wound bed prior to debridement Wound #2 Right,Distal Lower Leg o  Topical Lidocaine 4% cream applied to wound bed prior to debridement Wound #3 Left,Lateral Lower Leg o Topical Lidocaine 4% cream applied to wound bed prior to debridement Primary Wound Dressing Wound #1 Right,Proximal Lower Leg o Hydrogel - with Ag Wound #2 Right,Distal Lower Leg o Hydrogel - with Ag  Wound #3 Left,Lateral Lower Leg o Hydrogel - with Ag Secondary Dressing Wound #1 Right,Proximal Lower Leg Clendenning, Brittony H. ([id number]) o Gauze, ABD and Kerlix/Conform Wound #2 Right,Distal Lower Leg o Gauze, ABD and Kerlix/Conform Wound #3 Left,Lateral Lower Leg o Gauze, ABD and Kerlix/Conform Dressing Change Frequency Wound #1 Right,Proximal Lower Leg o Change dressing every day. Wound #2 Right,Distal Lower Leg o Change dressing every day. Wound #3 Left,Lateral Lower Leg o Change dressing every day. Follow-up Appointments Wound #1 Right,Proximal Lower Leg o Return Appointment in 1 week. Wound #2 Right,Distal Lower Leg o Return Appointment in 1 week. Wound #3 Left,Lateral Lower Leg o Return Appointment in 1 week. Edema Control Wound #1 Right,Proximal Lower Leg o Tubigrip Wound #2 Right,Distal Lower Leg o Tubigrip Wound #3 Left,Lateral Lower Leg o Tubigrip Home Health Wound #1 Right,Proximal Lower Leg o Continue Home Health Visits - [facility] o Home Health Nurse may visit PRN to address patientos wound care needs. o FACE TO FACE ENCOUNTER: MEDICARE and MEDICAID PATIENTS: I certify that this patient is under my care and that I had a face-to-face encounter that meets the physician face-to-face encounter requirements with this patient on this date. The encounter with the patient was in whole or in part for the following MEDICAL CONDITION: (primary reason for Home Healthcare) MEDICAL NECESSITY: I certify, that based on my findings, NURSING services are a medically necessary home health service. HOME BOUND STATUS: I certify that my  clinical findings Doucet, Katena H. ([id number]) support that this patient is homebound (i.e., Due to illness or injury, pt requires aid of supportive devices such as crutches, cane, wheelchairs, walkers, the use of special transportation or the assistance of another person to leave their place of residence. There is a normal inability to leave the home and doing so requires considerable and taxing effort. Other absences are for medical reasons / religious services and are infrequent or of short duration when for other reasons). o If current dressing causes regression in wound condition, may D/C ordered dressing product/s and apply Normal Saline Moist Dressing daily until next Wound Healing Center / Other MD appointment. Notify Wound Healing Center of regression in wound condition at [phone]. o Please direct any NON-WOUND related issues/requests for orders to patient's Primary Care Physician Wound #2 Right,Distal Lower Leg o Continue Home Health Visits - [facility] o Home Health Nurse may visit PRN to address patientos wound care needs. o FACE TO FACE ENCOUNTER: MEDICARE and MEDICAID PATIENTS: I certify that this patient is under my care and that I had a face-to-face encounter that meets the physician face-to-face encounter requirements with this patient on this date. The encounter with the patient was in whole or in part for the following MEDICAL CONDITION: (primary reason for Home Healthcare) MEDICAL NECESSITY: I certify, that based on my findings, NURSING services are a medically necessary home health service. HOME BOUND STATUS: I certify that my clinical findings support that this patient is homebound (i.e., Due to illness or injury, pt requires aid of supportive devices such as crutches, cane, wheelchairs, walkers, the use of special transportation or the assistance of another person to leave their place of residence. There is a normal inability to leave the home and doing  so requires considerable and taxing effort. Other absences are for medical reasons / religious services and are infrequent or of short duration when for other reasons). o If current dressing causes regression in wound condition, may D/C ordered dressing product/s and apply Normal Saline Moist Dressing daily until  next Wound Healing Center / Other MD appointment. Notify Wound Healing Center of regression in wound condition at [phone]. o Please direct any NON-WOUND related issues/requests for orders to patient's Primary Care Physician Wound #3 Left,Lateral Lower Leg o Continue Home Health Visits - [facility] o Home Health Nurse may visit PRN to address patientos wound care needs. o FACE TO FACE ENCOUNTER: MEDICARE and MEDICAID PATIENTS: I certify that this patient is under my care and that I had a face-to-face encounter that meets the physician face-to-face encounter requirements with this patient on this date. The encounter with the patient was in whole or in part for the following MEDICAL CONDITION: (primary reason for Home Healthcare) MEDICAL NECESSITY: I certify, that based on my findings, NURSING services are a medically necessary home health service. HOME BOUND STATUS: I certify that my clinical findings support that this patient is homebound (i.e., Due to illness or injury, pt requires aid of supportive devices such as crutches, cane, wheelchairs, walkers, the use of special transportation or the assistance of another person to leave their place of residence. There is a normal inability to leave the home and doing so requires considerable and taxing effort. Other absences are for medical reasons / religious services and are infrequent or of short duration when for other reasons). [name], [name] ([id number]) o If current dressing causes regression in wound condition, may D/C ordered dressing product/s and apply Normal Saline Moist Dressing daily until next Wound  Healing Center / Other MD appointment. Notify Wound Healing Center of regression in wound condition at [phone]. o Please direct any NON-WOUND related issues/requests for orders to patient's Primary Care Physician Medications-please add to medication list. Wound #1 Right,Proximal Lower Leg o P.O. Antibiotics - doxycycline Wound #2 Right,Distal Lower Leg o P.O. Antibiotics - doxycycline Wound #3 Left,Lateral Lower Leg o P.O. Antibiotics - doxycycline Laboratory o Culture and Sensitivity - left lower leg oooo Patient Medications Allergies: Vicodin Notifications Medication Indication Start End doxycycline hyclate 03/07/2015 DOSE 1 - oral 100 mg capsule - 1 capsule oral bid Electronic Signature(s) Signed: 03/07/2015 10:37:11 AM By: Evlyn Kanner MD, FACS Entered By: Evlyn Kanner on 03/07/2015 10:37:09 [name], [name] ([id number]) -------------------------------------------------------------------------------- Problem List Details Patient Name: [name]. 03/07/2015 9:30 Date of Service: AM Medical Record [id number] Number: Patient Account Number: [id number] May 29, 1956 (58 y.o. 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 03/07/2015 9:30 Date of Service: AM Medical Record [id number] Number: Patient Account Number: [id number] February 24, 1956 (58 y.o. Treating RN: Curtis Sites Date of Birth/Sex: Female) Other Clinician: Primary Care Physician: Roger Shelter Referring Physician: Fidel Levy Physician/Extender: Tania Ade in Treatment: 5 Subjective Chief Complaint Information obtained from Patient Patient presents to the wound care center for a consult due non healing wound. This 59 year old patient has had ulcerated areas on the left lower extremity more than  the right lower extremity to very painful for the last 3 weeks. History of Present Illness (HPI) The following HPI elements were documented for the patient's wound: Location: Painful ulcerated areas left lower extremity more than right lower extremity Quality: Patient reports experiencing a sharp pain to affected area(s). Severity: Patient states wound are getting worse. Duration: Patient has had the wound for < 4 weeks prior to presenting for treatment Timing: Pain in wound is Intermittent (comes and goes Context: The wound appeared gradually over time Modifying Factors: Other treatment(s) tried include: she has had Keflex for about 10 days. Associated Signs and Symptoms: Patient reports having difficulty standing for long periods. 58 year old patient recently seen by her PCP Dr. Venora Maples who saw her on 01/25/2015 4 ulcers both lower extremities which have been present for about 3 weeks. She took a ten-day course of Keflex and continues to have leg pain and swelling. Past medical history significant for chronic kidney disease, hypertension and diabetes mellitus without complications. She has never been a smoker. Mostly recent CBC was within normal limits, BMP showed that  her glucose was 136 and a beard was 45 and a creatinine is 4.19. The patient has had chronic renal failure and had a left AV fistula placed by Dr. Wyn Quaker in May and she is getting ready for hemodialysis soon. She has not been put on any specific medications for this problem. Addendum: tried to get Dr. 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 all questions answered. 02/07/2015 -- the patient started hemodialysis yesterday but was not successful and is going to be seeing Dr. Wyn Quaker regain. She also has a vascular workup for early August. No further investigations or referrals have been made after my conversation with her PCPs office last week. I have asked the patient to call her PCPs office and discuss further investigations and referrals. 02/14/2015 -- she has had 2 sessions of hemodialysis via the right IJ dialysis catheter. She has not seen her PCP or Dr. Wyn Quaker yet. Part of a vascular workup was done today and the next is sometime later in the month. Unfortunately she does not agree to any surgical intervention at this stage and I have not been able [facility], Brunilda H. ([id number]) to refer her to a surgeon for debridement in the OR. 02/21/2015 -- she has not decided to proceed with surgery yet and is going to see her PCP tomorrow for more discussions. I have offered to talk to her specialists at any time and she does understand that. 03/07/15 -- she has come to see as back after 2 weeks and says that she can only come once in 2 weeks because of social issues. This time around she agrees to see Dr. dew and discuss surgical intervention and I will put in a call to him. Addendum: I got to speak to Dr. Festus Barren who kindly agreed to take her up to the OR for a debridement and punch biopsies. He will also work on her peripheral vascular workup  to see if any angioplasty will help. Objective Constitutional Pulse regular. Respirations normal and unlabored. Afebrile. Vitals Time Taken: 9:47 AM, Height: 68 in, Weight: 294 lbs, BMI: 44.7, Temperature: 98.6 F, Pulse: 80 bpm, Respiratory Rate: 18 breaths/min, Blood Pressure: 120/53 mmHg. Eyes Nonicteric. Reactive to light. Ears, Nose, Mouth, and Throat Lips, teeth,  and gums WNL.Marland Kitchen Moist mucosa without lesions . Neck supple and nontender. No palpable supraclavicular or cervical adenopathy. Normal sized without goiter. Respiratory WNL. No retractions.. Cardiovascular Pedal Pulses WNL. No clubbing, cyanosis or edema. Chest Breasts symmetical and no nipple discharge.. Breast tissue WNL, no masses, lumps, or tenderness.. Lymphatic No adneopathy. No adenopathy. No adenopathy. Musculoskeletal Adexa without tenderness or enlargement.. Digits and nails w/o clubbing, cyanosis, infection, petechiae, ischemia, or inflammatory conditions.Marland Kitchen Psychiatric [name], [name] ([id number]) Judgement and insight Intact.. No evidence of depression, anxiety, or agitation.. General Notes: She continues to have significant eschar and there is an element of cellulitis surrounding the eschars. The right leg has minimal eschars but the left leg is quite extensive. Integumentary (Hair, Skin) No suspicious lesions. No crepitus or fluctuance. No peri-wound warmth or erythema. No masses.. Wound #1 status is Open. Original cause of wound was Gradually Appeared. The wound is located on the Right,Proximal Lower Leg. The wound measures 2.1cm length x 1.6cm width x 0.1cm depth; 2.639cm^2 area and 0.264cm^3 volume. The wound is limited to skin breakdown. There is no tunneling or undermining noted. There is a small amount of serosanguineous drainage noted. The wound margin is distinct with the outline attached to the wound base. There is no granulation within the wound bed. There is a large (67- 100%) amount of  necrotic tissue within the wound bed including Eschar. The periwound skin appearance exhibited: Localized Edema, Dry/Scaly, Moist. The periwound skin appearance did not exhibit: Callus, Crepitus, Excoriation, Fluctuance, Friable, Induration, Rash, Scarring, Maceration, Atrophie Blanche, Cyanosis, Ecchymosis, Hemosiderin Staining, Mottled, Pallor, Rubor, Erythema. Periwound temperature was noted as No Abnormality. The periwound has tenderness on palpation. Wound #2 status is Open. Original cause of wound was Gradually Appeared. The wound is located on the Right,Distal Lower Leg. The wound measures 1.7cm length x 1.5cm width x 0.1cm depth; 2.003cm^2 area and 0.2cm^3 volume. The wound is limited to skin breakdown. There is no tunneling or undermining noted. There is a small amount of serosanguineous drainage noted. The wound margin is distinct with the outline attached to the wound base. There is no granulation within the wound bed. There is a large (67-100%) amount of necrotic tissue within the wound bed including Eschar. The periwound skin appearance exhibited: Localized Edema, Dry/Scaly, Moist. The periwound skin appearance did not exhibit: Callus, Crepitus, Excoriation, Fluctuance, Friable, Induration, Rash, Scarring, Maceration, Atrophie Blanche, Cyanosis, Ecchymosis, Hemosiderin Staining, Mottled, Pallor, Rubor, Erythema. Periwound temperature was noted as No Abnormality. The periwound has tenderness on palpation. Wound #3 status is Open. Original cause of wound was Gradually Appeared. The wound is located on the Left,Lateral Lower Leg. The wound measures 22.5cm length x 17cm width x 0.5cm depth; 300.415cm^2 area and 150.207cm^3 volume. The wound is limited to skin breakdown. There is no tunneling or undermining noted. There is a medium amount of purulent drainage noted. The wound margin is flat and intact. There is no granulation within the wound bed. There is a large (67-100%) amount of  necrotic tissue within the wound bed including Eschar and Adherent Slough. The periwound skin appearance exhibited: Localized Edema, Dry/Scaly, Maceration. The periwound skin appearance did not exhibit: Callus, Crepitus, Excoriation, Fluctuance, Friable, Induration, Rash, Scarring, Moist, Atrophie Blanche, Cyanosis, Ecchymosis, Hemosiderin Staining, Mottled, Pallor, Rubor, Erythema. Periwound temperature was noted as No Abnormality. The periwound has tenderness on palpation. Wound #5 status is Converted. Original cause of wound was Gradually Appeared. The wound is located on the Left,Proximal,Lateral Lower Leg. Wound #6 status is Converted. Original cause of  wound was Gradually Appeared. The wound is located on the Left,Distal,Lateral Lower Leg. Wound #7 status is Converted. Original cause of wound was Gradually Appeared. The wound is located on the Left,Medial Lower Leg. 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 Wyn Quaker. This time around she is agreeable and I have put in a call to speak to Dr.Dew. In the meanwhile we will put her on Doxycycline 100 mg twice a day for 2 weeks and continue with hydrogel on the wound. She has an appointment to see Dr. Wyn Quaker this coming Monday. She will follow see me in follow-up next week. Plan Wound Cleansing: Wound #1 Right,Proximal Lower Leg: Clean wound with Normal Saline. May Shower, gently pat wound dry prior to applying new dressing. Wound #2 Right,Distal Lower Leg: Clean wound with  Normal Saline. May Shower, gently pat wound dry prior to applying new dressing. Wound #3 Left,Lateral Lower Leg: Clean wound with Normal Saline. May Shower, gently pat wound dry prior to applying new dressing. Anesthetic: Wound #1 Right,Proximal Lower Leg: Topical Lidocaine 4% cream applied to wound bed prior to debridement Wound #2 Right,Distal Lower Leg: Topical Lidocaine 4% cream applied to wound bed prior to debridement Wound #3 Left,Lateral Lower Leg: Topical Lidocaine 4% cream applied to wound bed prior to debridement Primary Wound Dressing: Wound #1 Right,Proximal Lower Leg: Hydrogel - with Ag Wound #2 Right,Distal Lower Leg: Hydrogel - with Ag [name], [name] ([id number]) Wound #3 Left,Lateral Lower Leg: Hydrogel - with Ag Secondary Dressing: Wound #1 Right,Proximal Lower Leg: Gauze, ABD and Kerlix/Conform Wound #2 Right,Distal Lower Leg: Gauze, ABD and Kerlix/Conform Wound #3 Left,Lateral Lower Leg: Gauze, ABD and Kerlix/Conform Dressing Change Frequency: Wound #1 Right,Proximal Lower Leg: Change dressing every day. Wound #2 Right,Distal Lower Leg: Change dressing every day. Wound #3 Left,Lateral Lower Leg: Change dressing every day. Follow-up Appointments: Wound #1 Right,Proximal Lower Leg: Return Appointment in 1 week. Wound #2 Right,Distal Lower Leg: Return Appointment in 1 week. Wound #3 Left,Lateral Lower Leg: Return Appointment in 1 week. Edema Control: Wound #1 Right,Proximal Lower Leg: Tubigrip Wound #2 Right,Distal Lower Leg: Tubigrip Wound #3 Left,Lateral Lower Leg: Tubigrip Home Health: Wound #1 Right,Proximal Lower Leg: Continue Home Health Visits - [facility] Nurse may visit PRN to address patient s wound care needs. FACE TO FACE ENCOUNTER: MEDICARE and MEDICAID PATIENTS: I certify that this patient is under my care and that I had a face-to-face encounter that meets the physician face-to-face encounter requirements with this  patient on this date. The encounter with the patient was in whole or in part for the following MEDICAL CONDITION: (primary reason for Home Healthcare) MEDICAL NECESSITY: I certify, that based on my findings, NURSING services are a medically necessary home health service. HOME BOUND STATUS: I certify that my clinical findings support that this patient is homebound (i.e., Due to illness or injury, pt requires aid of supportive devices such as crutches, cane, wheelchairs, walkers, the use of special transportation or the assistance of another person to leave their place of residence. There is a normal  inability to leave the home and doing so requires considerable and taxing effort. Other absences are for medical reasons / religious services and are infrequent or of short duration when for other reasons). If current dressing causes regression in wound condition, may D/C ordered dressing product/s and apply Normal Saline Moist Dressing daily until next Wound Healing Center / Other MD appointment. Notify Wound Healing Center of regression in wound condition at [phone]. Please direct any NON-WOUND related issues/requests for orders to patient's Primary Care Physician Wound #2 Right,Distal Lower Leg: Continue Home Health Visits - [facility] Nurse may visit PRN to address patient s wound care needs. LEVITA, MONICAL ([id number]) FACE TO FACE ENCOUNTER: MEDICARE and MEDICAID PATIENTS: I certify that this patient is under my care and that I had a face-to-face encounter that meets the physician face-to-face encounter requirements with this patient on this date. The encounter with the patient was in whole or in part for the following MEDICAL CONDITION: (primary reason for Home Healthcare) MEDICAL NECESSITY: I certify, that based on my findings, NURSING services are a medically necessary home health service. HOME BOUND STATUS: I certify that my clinical findings support that this patient is  homebound (i.e., Due to illness or injury, pt requires aid of supportive devices such as crutches, cane, wheelchairs, walkers, the use of special transportation or the assistance of another person to leave their place of residence. There is a normal inability to leave the home and doing so requires considerable and taxing effort. Other absences are for medical reasons / religious services and are infrequent or of short duration when for other reasons). If current dressing causes regression in wound condition, may D/C ordered dressing product/s and apply Normal Saline Moist Dressing daily until next Wound Healing Center / Other MD appointment. Notify Wound Healing Center of regression in wound condition at [phone]. Please direct any NON-WOUND related issues/requests for orders to patient's Primary Care Physician Wound #3 Left,Lateral Lower Leg: Continue Home Health Visits - [facility] Nurse may visit PRN to address patient s wound care needs. FACE TO FACE ENCOUNTER: MEDICARE and MEDICAID PATIENTS: I certify that this patient is under my care and that I had a face-to-face encounter that meets the physician face-to-face encounter requirements with this patient on this date. The encounter with the patient was in whole or in part for the following MEDICAL CONDITION: (primary reason for Home Healthcare) MEDICAL NECESSITY: I certify, that based on my findings, NURSING services are a medically necessary home health service. HOME BOUND STATUS: I certify that my clinical findings support that this patient is homebound (i.e., Due to illness or injury, pt requires aid of supportive devices such as crutches, cane, wheelchairs, walkers, the use of special transportation or the assistance of another person to leave their place of residence. There is a normal inability to leave the home and doing so requires considerable and taxing effort. Other absences are for medical reasons / religious  services and are infrequent or of short duration when for other reasons). If current dressing causes regression in wound condition, may D/C ordered dressing product/s and apply Normal Saline Moist Dressing daily until next Wound Healing Center / Other MD appointment. Notify Wound Healing Center of regression in wound condition at [phone]. Please direct any NON-WOUND related issues/requests for orders to patient's Primary Care Physician Medications-please add to medication list.: Wound #1 Right,Proximal Lower Leg: P.O. Antibiotics - doxycycline Wound #2 Right,Distal Lower Leg: P.O. Antibiotics - doxycycline Wound #3 Left,Lateral Lower Leg:  P.O. Antibiotics - doxycycline Laboratory ordered were: Culture and Sensitivity - left lower leg The following medication(s) was prescribed: doxycycline hyclate oral 100 mg capsule 1 1 capsule oral bid starting 03/07/2015 Bridgett, Anet H. ([id number]) I spent some time discussing with the patient that she definitely needs surgical debridement in the OR to get rid of all the eschars get some punch biopsies and get a proper vascular opinion from Dr. Wyn Quaker. This time around she is agreeable and I have put in a call to speak to Dr.Dew. In the meanwhile we will put her on Doxycycline 100 mg twice a day for 2 weeks and continue with hydrogel on the wound. She has an appointment to see Dr. Wyn Quaker this coming Monday. She will follow see me in follow-up next week. Electronic Signature(s) Signed: 03/09/2015 7:50:31 AM By: Evlyn Kanner MD, FACS Previous Signature: 03/09/2015 7:50:06 AM Version By: Evlyn Kanner MD, FACS Previous Signature: 03/07/2015 10:48:50 AM Version By: Evlyn Kanner MD, FACS Previous Signature: 03/07/2015 10:35:09 AM Version By: Evlyn Kanner MD, FACS Entered By: Evlyn Kanner on 03/09/2015 07:50:31 Horsey, [name] ([id number]) -------------------------------------------------------------------------------- SuperBill Details Patient Name:  Bouton, Disaya H. Date of Service: 03/07/2015 Medical Record Number: [id number] Patient Account Number: [id number] Date of Birth/Sex: 11-Jul-1956 (59 y.o. 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 Facility Procedures CPT4 Code: [id number] Description: 99214 - WOUND CARE VISIT-LEV 4 EST PT Modifier: Quantity: 1 Physician Procedures CPT4 Code: 3244010 Description: 99213 - WC PHYS LEVEL 3 - EST PT ICD-10 Description Diagnosis E11.622 Type 2 diabetes mellitus with other skin ulcer N18.4 Chronic kidney disease, stage 4 (severe) L97.222 Non-pressure chronic ulcer of left calf with fat L97.212 Non-pressure  chronic ulcer of right calf with fat Modifier: layer exposed layer exposed Quantity: 1 Electronic Signature(s) Signed: 03/07/2015 10:35:26 AM By: Evlyn Kanner MD, FACS Entered By: Evlyn Kanner on 03/07/2015 10:35:26

## 2015-03-08 NOTE — Progress Notes (Signed)
[name], [name] ([id number]) Visit Report for 03/07/2015 Arrival Information Details Patient Name: [name]. Date of Service: 03/07/2015 9:30 AM Medical Record Number: [id number] Patient Account Number: [id number] Date of Birth/Sex: February 06, 1956 (58 y.o. Female) Treating RN: Curtis Sites Primary Care Physician: Fidel Levy Other Clinician: Referring Physician: Fidel Levy Treating Physician/Extender: Rudene Re in Treatment: 5 Visit Information History Since Last Visit Added or deleted any medications: No Patient Arrived: Ambulatory Any new allergies or adverse reactions: No Arrival Time: 09:47 Had a fall or experienced change in No Accompanied By: self activities of daily living that may affect Transfer Assistance: None risk of falls: Patient Identification Verified: Yes Signs or symptoms of abuse/neglect since last No Secondary Verification Process Yes visito Completed: Hospitalized since last visit: No Patient Has Alerts: Yes Pain Present Now: No Patient Alerts: DMII ABI [place] Bilateral Electronic Signature(s) Signed: 03/07/2015 5:20:03 PM By: Curtis Sites Entered By: Curtis Sites on 03/07/2015 09:47:27 [name] ([id number]) -------------------------------------------------------------------------------- Clinic Level of Care Assessment Details Patient Name: [name], [name] Date of Service: 03/07/2015 9:30 AM Medical Record Number: [id number] Patient Account Number: [id number] Date of Birth/Sex: [birth year] (58 y.o. Female) Treating RN: Curtis Sites Primary Care Physician: Fidel Levy Other Clinician: Referring Physician: Fidel Levy Treating Physician/Extender: Rudene Re in Treatment: 5 Clinic Level of Care Assessment Items TOOL 4 Quantity Score []  - Use when only an EandM is performed on FOLLOW-UP visit 0 ASSESSMENTS - Nursing Assessment / Reassessment X - Reassessment of Co-morbidities  (includes updates in patient status) 1 10 X - Reassessment of Adherence to Treatment Plan 1 5 ASSESSMENTS - Wound and Skin Assessment / Reassessment []  - Simple Wound Assessment / Reassessment - one wound 0 X - Complex Wound Assessment / Reassessment - multiple wounds 3 5 []  - Dermatologic / Skin Assessment (not related to wound area) 0 ASSESSMENTS - Focused Assessment X - Circumferential Edema Measurements - multi extremities 2 5 []  - Nutritional Assessment / Counseling / Intervention 0 X - Lower Extremity Assessment (monofilament, tuning fork, pulses) 1 5 []  - Peripheral Arterial Disease Assessment (using hand held doppler) 0 ASSESSMENTS - Ostomy and/or Continence Assessment and Care []  - Incontinence Assessment and Management 0 []  - Ostomy Care Assessment and Management (repouching, etc.) 0 PROCESS - Coordination of Care X - Simple Patient / Family Education for ongoing care 1 15 []  - Complex (extensive) Patient / Family Education for ongoing care 0 []  - Staff obtains Chiropractor, Records, Test Results / Process Orders 0 []  - Staff telephones HHA, Nursing Homes / Clarify orders / etc 0 []  - Routine Transfer to another Facility (non-emergent condition) 0 [name], [name] ([id number]) []  - Routine Hospital Admission (non-emergent condition) 0 []  - New Admissions / Manufacturing engineer / Ordering NPWT, Apligraf, etc. 0 []  - Emergency Hospital Admission (emergent condition) 0 X - Simple Discharge Coordination 1 10 []  - Complex (extensive) Discharge Coordination 0 PROCESS - Special Needs []  - Pediatric / Minor Patient Management 0 []  - Isolation Patient Management 0 []  - Hearing / Language / Visual special needs 0 []  - Assessment of Community assistance (transportation, D/C planning, etc.) 0 []  - Additional assistance / Altered mentation 0 []  - Support Surface(s) Assessment (bed, cushion, seat, etc.) 0 INTERVENTIONS - Wound Cleansing / Measurement []  - Simple Wound Cleansing - one  wound 0 X - Complex Wound Cleansing - multiple wounds 3 5 X - Wound Imaging (photographs - any number of wounds) 1 5 []  - Wound  Tracing (instead of photographs) 0 []  - Simple Wound Measurement - one wound 0 X - Complex Wound Measurement - multiple wounds 3 5 INTERVENTIONS - Wound Dressings []  - Small Wound Dressing one or multiple wounds 0 X - Medium Wound Dressing one or multiple wounds 3 15 []  - Large Wound Dressing one or multiple wounds 0 []  - Application of Medications - topical 0 []  - Application of Medications - injection 0 INTERVENTIONS - Miscellaneous []  - External ear exam 0 Dizdarevic, Milayah H. ([id number]) []  - Specimen Collection (cultures, biopsies, blood, body fluids, etc.) 0 []  - Specimen(s) / Culture(s) sent or taken to Lab for analysis 0 []  - Patient Transfer (multiple staff / Michiel Sites Lift / Similar devices) 0 []  - Simple Staple / Suture removal (25 or less) 0 []  - Complex Staple / Suture removal (26 or more) 0 []  - Hypo / Hyperglycemic Management (close monitor of Blood Glucose) 0 []  - Ankle / Brachial Index (ABI) - do not check if billed separately 0 X - Vital Signs 1 5 Has the patient been seen at the hospital within the last three years: Yes Total Score: 155 Level Of Care: New/Established - Level 4 Electronic Signature(s) Signed: 03/07/2015 5:20:03 PM By: Curtis Sites Entered By: Curtis Sites on 03/07/2015 10:25:16 [name] ([id number]) -------------------------------------------------------------------------------- Encounter Discharge Information Details Patient Name: [name], [name] Date of Service: 03/07/2015 9:30 AM Medical Record Number: [id number] Patient Account Number: [id number] Date of Birth/Sex: 04/07/1956 (58 y.o. Female) Treating RN: Curtis Sites Primary Care Physician: Fidel Levy Other Clinician: Referring Physician: Fidel Levy Treating Physician/Extender: Rudene Re in Treatment: 5 Encounter Discharge  Information Items Discharge Pain Level: 0 Discharge Condition: Stable Ambulatory Status: Ambulatory Discharge Destination: Home Transportation: Private Auto Accompanied By: self Schedule Follow-up Appointment: Yes Medication Reconciliation completed and provided to Patient/Care No Khadejah Son: Provided on Clinical Summary of Care: 03/07/2015 Form Type Recipient Paper Patient PM Electronic Signature(s) Signed: 03/07/2015 10:43:15 AM By: Gwenlyn Perking Entered By: Gwenlyn Perking on 03/07/2015 10:43:15 Tagliaferri, [name] ([id number]) -------------------------------------------------------------------------------- Lower Extremity Assessment Details Patient Name: Pingree, [place] H. Date of Service: 03/07/2015 9:30 AM Medical Record Number: [id number] Patient Account Number: [id number] Date of Birth/Sex: 02/01/1956 (58 y.o. Female) Treating RN: Curtis Sites Primary Care Physician: Fidel Levy Other Clinician: Referring Physician: Fidel Levy Treating Physician/Extender: Rudene Re in Treatment: 5 Edema Assessment Assessed: [Left: No] [Right: No] Edema: [Left: Yes] [Right: Yes] Calf Left: Right: Point of Measurement: 34 cm From Medial Instep 47.3 cm 43.4 cm Ankle Left: Right: Point of Measurement: 10 cm From Medial Instep 26.7 cm 25 cm Vascular Assessment Pulses: Posterior Tibial Dorsalis Pedis Palpable: [Left:Yes] [Right:Yes] Extremity colors, hair growth, and conditions: Extremity Color: [Left:Mottled] [Right:Normal] Hair Growth on Extremity: [Left:No] [Right:No] Temperature of Extremity: [Left:Warm] [Right:Warm] Capillary Refill: [Left:< 3 seconds] [Right:< 3 seconds] Electronic Signature(s) Signed: 03/07/2015 5:20:03 PM By: Curtis Sites Entered By: Curtis Sites on 03/07/2015 10:01:04 [name], [name] ([id number]) -------------------------------------------------------------------------------- Multi Wound Chart Details Patient Name: Vanantwerp,  Areeba H. Date of Service: 03/07/2015 9:30 AM Medical Record Number: [id number] Patient Account Number: [id number] Date of Birth/Sex: July 06, 1956 (58 y.o. 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] [2:Calciphylaxis] [3:Calciphylaxis] Comorbid History: [1:Hypertension, Type II Diabetes] [2:Hypertension, Type II Diabetes] [3:Hypertension, Type II Diabetes] Date Acquired: [1:01/09/2015] [2:01/09/2015] [3:01/09/2015] Weeks of Treatment: [1:5] [2:5] [3:5] Wound Status: [1:Open] [2:Open] [3:Open] Measurements L x W x D 2.1x1.6x0.1 [2:1.7x1.5x0.1] [3:22.5x17x0.5] (cm) Area (cm) : [1:2.639] [2:2.003] [3:300.415] Volume (cm) : [1:0.264] [2:0.2] [3:150.207] % Reduction in Area: [1:-5514.90%] [2:-2030.90%] [3:-2632.00%] % Reduction in Volume: -5180.00% [2:-2122.20%] [3:-13555.20%] Classification: [1:Full Thickness Without Exposed Support Structures] [2:Full Thickness Without Exposed Support Structures] [3:Full Thickness Without Exposed Support Structures] HBO Classification: [1:Grade 1] [2:Grade 1] [3:Grade 2] Exudate Amount: [1:Small] [2:Small] [3:Medium] Exudate Type: [1:Serosanguineous] [2:Serosanguineous] [3:Purulent] Exudate Color: [1:red, brown] [2:red, brown] [3:yellow, brown, green] Foul Odor After [1:No] [2:No] [3:Yes] Cleansing: Odor Anticipated Due to N/A [2:N/A] [3:No] Product  Use: Wound Margin: [1:Distinct, outline attached] [2:Distinct, outline attached Flat and Intact] Granulation Amount: [1:None Present (0%)] [2:None Present (0%)] [3:None Present (0%)] Necrotic Amount: [1:Large (67-100%)] [2:Large (67-100%)] [3:Large (67-100%)] Necrotic Tissue: Eschar Eschar Eschar, Adherent Slough Exposed Structures: Fascia: No Fascia: No Fascia: No Fat: No Fat: No Fat: No Tendon: No Tendon: No Tendon: No Muscle: No Muscle: No Muscle: No Joint: No Joint: No Joint: No Bone: No Bone: No Bone: No Limited to Skin Limited to Skin Limited to Skin Breakdown Breakdown Breakdown Epithelialization: None None Small (1-33%) Periwound Skin Texture: Edema: Yes Edema: Yes Edema: Yes Excoriation: No Excoriation: No Excoriation: No Induration: No Induration: No Induration: No Callus: No Callus: No Callus: No Crepitus: No Crepitus: No Crepitus: No Fluctuance: No Fluctuance: No Fluctuance: No Friable: No Friable: No Friable: No Rash: No Rash: No Rash: No Scarring: No Scarring: No Scarring: No Periwound Skin Moist: Yes Moist: Yes Maceration: Yes Moisture: Dry/Scaly: Yes Dry/Scaly: Yes Dry/Scaly: Yes Maceration: No Maceration: No Moist: No Periwound Skin Color: Atrophie Blanche: No Atrophie Blanche: No Atrophie Blanche: No Cyanosis: No Cyanosis: No Cyanosis: No Ecchymosis: No Ecchymosis: No Ecchymosis: No Erythema: No Erythema: No Erythema: No Hemosiderin Staining: No Hemosiderin Staining: No Hemosiderin Staining: No Mottled: No Mottled: No Mottled: No Pallor: No Pallor: No Pallor: No Rubor: No Rubor: No Rubor: No Temperature: No Abnormality No Abnormality No Abnormality Tenderness on Yes Yes Yes Palpation: Wound Preparation: Ulcer Cleansing: Ulcer Cleansing: Ulcer Cleansing: Rinsed/Irrigated with Rinsed/Irrigated with Rinsed/Irrigated with Saline Saline Saline Topical Anesthetic Topical Anesthetic Topical Anesthetic Applied:  Other: lidocaine Applied: Other: lidocaine Applied: Other: lidocaine 4% 4% 4% Wound Number: [id number] [phone]  Photos: No Photos No Photos No Photos Wound Location: Left, Proximal, Lateral Left, Distal, Lateral Lower Left, Medial Lower Leg Lower Leg Leg Wounding Event: Gradually Appeared Gradually Appeared Gradually Appeared 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 ([id number]) Wound Status: Converted Converted Converted Measurements L x W x D N/A N/A N/A (cm) Area (cm) : N/A N/A N/A Volume (cm) : N/A N/A N/A % Reduction in Area: N/A N/A N/A % Reduction in Volume: N/A N/A N/A Classification: Full Thickness Without Full Thickness Without Unclassifiable Exposed Support Exposed Support Structures Structures HBO Classification: N/A N/A N/A Exudate Amount: N/A N/A N/A Exudate Type: N/A N/A N/A Exudate Color: N/A N/A N/A Foul Odor After N/A N/A N/A Cleansing: Odor Anticipated Due to N/A N/A N/A Product Use: Wound Margin: N/A N/A N/A Granulation Amount: N/A N/A N/A Necrotic Amount: N/A N/A N/A Necrotic Tissue: N/A  N/A N/A Exposed Structures: N/A N/A N/A Epithelialization: N/A N/A N/A Periwound Skin Texture: No Abnormalities Noted No Abnormalities Noted No Abnormalities Noted Periwound Skin No Abnormalities Noted No Abnormalities Noted No Abnormalities Noted Moisture: Periwound Skin Color: No Abnormalities Noted No Abnormalities Noted No Abnormalities Noted Temperature: N/A N/A N/A Tenderness on No No No Palpation: Wound Preparation: N/A N/A N/A Treatment Notes Electronic Signature(s) Signed: 03/07/2015 10:15:33 AM By: Curtis Sites Entered By: Curtis Sites on 03/07/2015 10:15:33 Taira, [name] ([id number]) -------------------------------------------------------------------------------- Multi-Disciplinary Care Plan  Details Patient Name: Wittwer, Daisha H. Date of Service: 03/07/2015 9:30 AM Medical Record Number: [id number] Patient Account Number: [id number] Date of Birth/Sex: [birth year] (58 y.o. Female) Treating RN: Curtis Sites Primary Care Physician: Fidel Levy Other Clinician: Referring Physician: Fidel Levy Treating Physician/Extender: Rudene Re in Treatment: 5 Active Inactive Orientation to the Wound Care Program Nursing Diagnoses: Knowledge deficit related to the wound healing center program Goals: Patient/caregiver will verbalize understanding of the Wound Healing Center Program Date Initiated: 01/30/2015 Goal Status: Active Interventions: Provide education on orientation to the wound center Notes: Pain, Acute or Chronic Nursing Diagnoses: Pain, acute or chronic: actual or potential Goals: Patient will verbalize adequate pain control and receive pain control interventions during procedures as needed Date Initiated: 01/30/2015 Goal Status: Active Patient/caregiver will verbalize adequate pain control between visits Date Initiated: 01/30/2015 Goal Status: Active Interventions: Assess comfort goal upon admission Encourage patient to take pain medications as prescribed Notes: Venous Leg Ulcer Nursing Diagnoses: [name], [name] ([id number]) Potential for venous Insuffiency (use before diagnosis confirmed) Goals: Non-invasive venous studies are completed as ordered Date Initiated: 01/30/2015 Goal Status: Active Interventions: Assess peripheral edema status every visit. Notes: Wound/Skin Impairment Nursing Diagnoses: Impaired tissue integrity Goals: Ulcer/skin breakdown will have a volume reduction of 30% by week 4 Date Initiated: 01/30/2015 Goal Status: Active Ulcer/skin breakdown will have a volume reduction of 50% by week 8 Date Initiated: 01/30/2015 Goal Status: Active Ulcer/skin breakdown will have a volume reduction of 80% by week 12 Date  Initiated: 01/30/2015 Goal Status: Active Ulcer/skin breakdown will heal within 14 weeks Date Initiated: 01/30/2015 Goal Status: Active Interventions: Assess ulceration(s) every visit Notes: Electronic Signature(s) Signed: 03/07/2015 10:15:21 AM By: Curtis Sites Entered By: Curtis Sites on 03/07/2015 10:15:21 Vega, [name] ([id number]) -------------------------------------------------------------------------------- Patient/Caregiver Education Details Patient Name: Tay, Keeli H. Date of Service: 03/07/2015 9:30 AM Medical Record Number: [id number] Patient Account Number: [id number] Date of Birth/Gender: [birth year] (59 y.o. Female) Treating RN: Curtis Sites Primary Care Physician: Fidel Levy Other Clinician: Referring Physician: Fidel Levy Treating Physician/Extender: Rudene Re in Treatment: 5 Education Assessment Education Provided To: Patient Education Topics Provided Wound/Skin Impairment: Handouts: Other: wound care as ordered and wound healing Methods: Demonstration, Explain/Verbal Responses: State content correctly Electronic Signature(s) Signed: 03/07/2015 10:16:07 AM By: Curtis Sites Entered By: Curtis Sites on 03/07/2015 10:16:07 Ponzo, Raeley H. ([id number]) -------------------------------------------------------------------------------- Wound Assessment Details Patient Name: Lanuza, Brecklyn H. Date of Service: 03/07/2015 9:30 AM Medical Record Number: [id number] Patient Account Number: [id number] Date of Birth/Sex: July 22, 1955 (58 y.o. Female) Treating RN: Curtis Sites Primary Care Physician: Fidel Levy Other Clinician: Referring Physician: Fidel Levy Treating Physician/Extender: Rudene Re in Treatment: 5 Wound Status Wound Number: 1 Primary Etiology: Calciphylaxis Wound Location: Right Lower Leg - Proximal Wound Status: Open Wounding Event: Gradually Appeared Comorbid History:  Hypertension, Type II Diabetes Date Acquired: 01/09/2015 Weeks Of Treatment: 5 Clustered Wound: No Photos Photo Uploaded By: Elliot Gurney, RN, BSN, Kim on 03/07/2015 17:08:46 Wound  Measurements Length: (cm) 2.1 Width: (cm) 1.6 Depth: (cm) 0.1 Area: (cm) 2.639 Volume: (cm) 0.264 % Reduction in Area: -5514.9% % Reduction in Volume: -5180% Epithelialization: None Tunneling: No Undermining: No Wound Description Full Thickness Without Exposed Foul Odor A Classification: Support Structures Diabetic Severity Grade 1 (Wagner): Wound Margin: Distinct, outline attached Exudate Amount: Small Exudate Type: Serosanguineous Exudate Color: red, brown fter Cleansing: No Wound Bed Granulation Amount: None Present (0%) Exposed Structure Necrotic Amount: Large (67-100%) Fascia Exposed: No Puleo, Kelsie H. ([id number]) Necrotic Quality: Eschar Fat Layer Exposed: No Tendon Exposed: No Muscle Exposed: No Joint Exposed: No Bone Exposed: No Limited to Skin Breakdown Periwound Skin Texture Texture Color No Abnormalities Noted: No No Abnormalities Noted: No Callus: No Atrophie Blanche: No Crepitus: No Cyanosis: No Excoriation: No Ecchymosis: No Fluctuance: No Erythema: No Friable: No Hemosiderin Staining: No Induration: No Mottled: No Localized Edema: Yes Pallor: No Rash: No Rubor: No Scarring: No Temperature / Pain Moisture Temperature: No Abnormality No Abnormalities Noted: No Tenderness on Palpation: Yes Dry / Scaly: Yes Maceration: No Moist: Yes Wound Preparation Ulcer Cleansing: Rinsed/Irrigated with Saline Topical Anesthetic Applied: Other: lidocaine 4%, Treatment Notes Wound #1 (Right, Proximal Lower Leg) 1. Cleansed with: Clean wound with Normal Saline 2. Anesthetic Topical Lidocaine 4% cream to wound bed prior to debridement 4. Dressing Applied: Hydrogel 5. Secondary Dressing Applied ABD Pad Kerlix/Conform Non-Adherent pad 7. Secured  with Secretary/administrator) Signed: 03/07/2015 5:20:03 PM By: Vilma Prader, [name] ([id number]) Entered By: Curtis Sites on 03/07/2015 10:04:13 Pippins, [name] ([id number]) -------------------------------------------------------------------------------- Wound Assessment Details Patient Name: Lambson, Lometa H. Date of Service: 03/07/2015 9:30 AM Medical Record Number: [id number] Patient Account Number: [id number] Date of Birth/Sex: 01/01/1956 (58 y.o. Female) Treating RN: Curtis Sites Primary Care Physician: Fidel Levy Other Clinician: Referring Physician: Fidel Levy Treating Physician/Extender: Rudene Re in Treatment: 5 Wound Status Wound Number: 2 Primary Etiology: Calciphylaxis Wound Location: Right Lower Leg - Distal Wound Status: Open Wounding Event: Gradually Appeared Comorbid History: Hypertension, Type II Diabetes Date Acquired: 01/09/2015 Weeks Of Treatment: 5 Clustered Wound: No Photos Photo Uploaded By: Elliot Gurney, RN, BSN, Kim on 03/07/2015 17:10:00 Wound Measurements Length: (cm) 1.7 Width: (cm) 1.5 Depth: (cm) 0.1 Area: (cm) 2.003 Volume: (cm) 0.2 % Reduction in Area: -2030.9% % Reduction in Volume: -2122.2% Epithelialization: None Tunneling: No Undermining: No Wound Description Full Thickness Without Exposed Foul Odor A Classification: Support Structures Diabetic Severity Grade 1 (Wagner): Wound Margin: Distinct, outline attached Exudate Amount: Small Exudate Type: Serosanguineous Exudate Color: red, brown fter Cleansing: No Wound Bed Granulation Amount: None Present (0%) Exposed Structure Necrotic Amount: Large (67-100%) Fascia Exposed: No Weaber, Karna H. ([id number]) Necrotic Quality: Eschar Fat Layer Exposed: No Tendon Exposed: No Muscle Exposed: No Joint Exposed: No Bone Exposed: No Limited to Skin Breakdown Periwound Skin Texture Texture Color No Abnormalities Noted: No No  Abnormalities Noted: No Callus: No Atrophie Blanche: No Crepitus: No Cyanosis: No Excoriation: No Ecchymosis: No Fluctuance: No Erythema: No Friable: No Hemosiderin Staining: No Induration: No Mottled: No Localized Edema: Yes Pallor: No Rash: No Rubor: No Scarring: No Temperature / Pain Moisture Temperature: No Abnormality No Abnormalities Noted: No Tenderness on Palpation: Yes Dry / Scaly: Yes Maceration: No Moist: Yes Wound Preparation Ulcer Cleansing: Rinsed/Irrigated with Saline Topical Anesthetic Applied: Other: lidocaine 4%, Treatment Notes Wound #2 (Right, Distal Lower Leg) 1. Cleansed with: Clean wound with Normal Saline 2. Anesthetic Topical Lidocaine 4% cream to wound bed prior to debridement 4. Dressing Applied: Hydrogel  5. Secondary Dressing Applied ABD Pad Kerlix/Conform Non-Adherent pad 7. Secured with Secretary/administrator) Signed: 03/07/2015 5:20:03 PM By: Vilma Prader, [name] ([id number]) Entered By: Curtis Sites on 03/07/2015 10:04:28 Gwinn, [name] ([id number]) -------------------------------------------------------------------------------- Wound Assessment Details Patient Name: Nish, Mazell H. Date of Service: 03/07/2015 9:30 AM Medical Record Number: [id number] Patient Account Number: [id number] Date of Birth/Sex: [birth year] (58 y.o. Female) Treating RN: Curtis Sites Primary Care Physician: Fidel Levy Other Clinician: Referring Physician: Fidel Levy Treating Physician/Extender: Rudene Re in Treatment: 5 Wound Status Wound Number: 3 Primary Etiology: Calciphylaxis Wound Location: Left Lower Leg - Lateral Wound Status: Open Wounding Event: Gradually Appeared Comorbid History: Hypertension, Type II Diabetes Date Acquired: 01/09/2015 Weeks Of Treatment: 5 Clustered Wound: No Photos Wound Measurements Length: (cm) 22.5 Width: (cm) 17 Depth: (cm) 0.5 Area: (cm)  300.415 Volume: (cm) 150.207 % Reduction in Area: -2632% % Reduction in Volume: -13555.2% Epithelialization: Small (1-33%) Tunneling: No Undermining: No Wound Description Full Thickness Without Classification: Exposed Support Structures Diabetic Severity Grade 2 (Wagner): Wound Margin: Flat and Intact Exudate Amount: Medium Exudate Type: Purulent Exudate Color: yellow, brown, green Foul Odor After Cleansing: Yes Due to Product Use: No Wound Bed Granulation Amount: None Present (0%) Exposed Structure Necrotic Amount: Large (67-100%) Fascia Exposed: No Mika, Mychal H. ([id number]) Necrotic Quality: Eschar, Adherent Slough Fat Layer Exposed: No Tendon Exposed: No Muscle Exposed: No Joint Exposed: No Bone Exposed: No Limited to Skin Breakdown Periwound Skin Texture Texture Color No Abnormalities Noted: No No Abnormalities Noted: No Callus: No Atrophie Blanche: No Crepitus: No Cyanosis: No Excoriation: No Ecchymosis: No Fluctuance: No Erythema: No Friable: No Hemosiderin Staining: No Induration: No Mottled: No Localized Edema: Yes Pallor: No Rash: No Rubor: No Scarring: No Temperature / Pain Moisture Temperature: No Abnormality No Abnormalities Noted: No Tenderness on Palpation: Yes Dry / Scaly: Yes Maceration: Yes Moist: No Wound Preparation Ulcer Cleansing: Rinsed/Irrigated with Saline Topical Anesthetic Applied: Other: lidocaine 4%, Treatment Notes Wound #3 (Left, Lateral Lower Leg) 1. Cleansed with: Clean wound with Normal Saline 2. Anesthetic Topical Lidocaine 4% cream to wound bed prior to debridement 4. Dressing Applied: Hydrogel 5. Secondary Dressing Applied ABD Pad [employer] Non-Adherent pad 7. Secured with Secretary/administrator) Signed: 03/07/2015 5:20:03 PM By: Curtis Sites Signed: 03/07/2015 5:29:15 PM By: Elliot Gurney RN, BSN, Kim RN, BSN [place], Milliani Rexene [name] ([id number]) Entered By: Elliot Gurney RN, BSN, Kim on 03/07/2015  17:12:30 Mazor, [name] ([id number]) -------------------------------------------------------------------------------- Wound Assessment Details Patient Name: Hansson, Nyomie H. Date of Service: 03/07/2015 9:30 AM Medical Record Number: [id number] Patient Account Number: [id number] Date of Birth/Sex: [birth year] (58 y.o. Female) Treating RN: Curtis Sites Primary Care Physician: Fidel Levy Other Clinician: Referring Physician: Fidel Levy Treating Physician/Extender: Rudene Re in Treatment: 5 Wound Status Wound Number: 5 Primary Etiology: Calciphylaxis Wound Location: Left, Proximal, Lateral Lower Wound Status: Converted Leg Wounding Event: Gradually Appeared Date Acquired: 01/09/2015 Weeks Of Treatment: 5 Clustered Wound: No Wound Description Full Thickness Without Exposed Classification: Support Structures Periwound Skin Texture Texture Color No Abnormalities Noted: No No Abnormalities Noted: No Moisture No Abnormalities Noted: No Electronic Signature(s) Signed: 03/07/2015 5:20:03 PM By: Curtis Sites Entered By: Curtis Sites on 03/07/2015 10:01:38 Prevost, Faria Rexene [name] ([id number]) -------------------------------------------------------------------------------- Wound Assessment Details Patient Name: Hegel, Demmi H. Date of Service: 03/07/2015 9:30 AM Medical Record Number: [id number] Patient Account Number: [id number] Date of Birth/Sex: 1955-09-25 (58 y.o. Female) Treating RN: Curtis Sites Primary Care Physician: Fidel Levy Other Clinician: Referring Physician: Jonna Clark,  JAMES Treating Physician/Extender: Rudene Re in Treatment: 5 Wound Status Wound Number: 6 Primary Etiology: Calciphylaxis Wound Location: Left, Distal, Lateral Lower Leg Wound Status: Converted Wounding Event: Gradually Appeared Date Acquired: 01/09/2015 Weeks Of Treatment: 5 Clustered Wound: No Wound Description Full Thickness Without  Exposed Classification: Support Structures Periwound Skin Texture Texture Color No Abnormalities Noted: No No Abnormalities Noted: No Moisture No Abnormalities Noted: No Electronic Signature(s) Signed: 03/07/2015 5:20:03 PM By: Curtis Sites Entered By: Curtis Sites on 03/07/2015 10:01:39 Tensley, Lory Rexene [name] ([id number]) -------------------------------------------------------------------------------- Wound Assessment Details Patient Name: Gosse, Dyanna H. Date of Service: 03/07/2015 9:30 AM Medical Record Number: [id number] Patient Account Number: [id number] Date of Birth/Sex: 01-29-1956 (58 y.o. Female) Treating RN: Curtis Sites Primary Care Physician: Fidel Levy Other Clinician: Referring Physician: Fidel Levy Treating Physician/Extender: Rudene Re in Treatment: 5 Wound Status Wound Number: 7 Primary Etiology: Arterial Insufficiency Ulcer Wound Location: Left, Medial Lower Leg Wound Status: Converted Wounding Event: Gradually Appeared Date Acquired: 01/31/2015 Weeks Of Treatment: 4 Clustered Wound: No Wound Description Classification: Unclassifiable Periwound Skin Texture Texture Color No Abnormalities Noted: No No Abnormalities Noted: No Moisture No Abnormalities Noted: No Electronic Signature(s) Signed: 03/07/2015 5:20:03 PM By: Curtis Sites Entered By: Curtis Sites on 03/07/2015 10:01:39 Cravens, Rosealyn Rexene [name] ([id number]) -------------------------------------------------------------------------------- Vitals Details Patient Name: Grandpre, Kym H. Date of Service: 03/07/2015 9:30 AM Medical Record Number: [id number] Patient Account Number: [id number] Date of Birth/Sex: 1956/06/10 (58 y.o. Female) Treating RN: Curtis Sites Primary Care Physician: Fidel Levy Other Clinician: Referring Physician: Fidel Levy Treating Physician/Extender: Rudene Re in Treatment: 5 Vital Signs Time Taken: 09:47 Temperature  (F): 98.6 Height (in): 68 Pulse (bpm): 80 Weight (lbs): 294 Respiratory Rate (breaths/min): 18 Body Mass Index (BMI): 44.7 Blood Pressure (mmHg): 120/53 Reference Range: 80 - 120 mg / dl Electronic Signature(s) Signed: 03/07/2015 5:20:03 PM By: Curtis Sites Entered By: Curtis Sites on 03/07/2015 09:48:28

## 2015-03-09 ENCOUNTER — Ambulatory Visit: Admit: 2015-03-09 | Payer: BC Managed Care – PPO | Admitting: Vascular Surgery

## 2015-03-09 SURGERY — A/V SHUNTOGRAM/FISTULAGRAM
Anesthesia: Moderate Sedation | Laterality: Right

## 2015-03-10 ENCOUNTER — Ambulatory Visit: Admit: 2015-03-10 | Payer: Self-pay | Admitting: Vascular Surgery

## 2015-03-10 SURGERY — A/V SHUNTOGRAM/FISTULAGRAM
Anesthesia: Moderate Sedation

## 2015-03-11 LAB — WOUND CULTURE

## 2015-03-13 ENCOUNTER — Encounter
Admission: RE | Disposition: A | Discharge status: Home / Self Care | Payer: Self-pay | Source: Ambulatory Visit | Attending: Vascular Surgery

## 2015-03-13 ENCOUNTER — Ambulatory Visit
Admission: RE | Admit: 2015-03-13 | Discharge: 2015-03-13 | Disposition: A | Discharge status: Home / Self Care | Payer: BC Managed Care – PPO | Source: Ambulatory Visit | Attending: Vascular Surgery | Admitting: Vascular Surgery

## 2015-03-13 ENCOUNTER — Encounter: Payer: Self-pay | Admitting: *Deleted

## 2015-03-13 DIAGNOSIS — Z794 Long term (current) use of insulin: Secondary | ICD-10-CM | POA: Insufficient documentation

## 2015-03-13 DIAGNOSIS — T82858A Stenosis of vascular prosthetic devices, implants and grafts, initial encounter: Secondary | ICD-10-CM | POA: Insufficient documentation

## 2015-03-13 DIAGNOSIS — E1122 Type 2 diabetes mellitus with diabetic chronic kidney disease: Secondary | ICD-10-CM | POA: Insufficient documentation

## 2015-03-13 DIAGNOSIS — E669 Obesity, unspecified: Secondary | ICD-10-CM | POA: Insufficient documentation

## 2015-03-13 DIAGNOSIS — Z6839 Body mass index (BMI) 39.0-39.9, adult: Secondary | ICD-10-CM | POA: Insufficient documentation

## 2015-03-13 DIAGNOSIS — I120 Hypertensive chronic kidney disease with stage 5 chronic kidney disease or end stage renal disease: Secondary | ICD-10-CM | POA: Diagnosis not present

## 2015-03-13 DIAGNOSIS — Z992 Dependence on renal dialysis: Secondary | ICD-10-CM | POA: Diagnosis not present

## 2015-03-13 DIAGNOSIS — Y832 Surgical operation with anastomosis, bypass or graft as the cause of abnormal reaction of the patient, or of later complication, without mention of misadventure at the time of the procedure: Secondary | ICD-10-CM | POA: Diagnosis not present

## 2015-03-13 DIAGNOSIS — Z79899 Other long term (current) drug therapy: Secondary | ICD-10-CM | POA: Diagnosis not present

## 2015-03-13 DIAGNOSIS — N186 End stage renal disease: Secondary | ICD-10-CM | POA: Diagnosis not present

## 2015-03-13 HISTORY — PX: PERIPHERAL VASCULAR CATHETERIZATION: SHX172C

## 2015-03-13 LAB — POTASSIUM (ARMC VASCULAR LAB ONLY): POTASSIUM (ARMC VASCULAR LAB): 3.2

## 2015-03-13 LAB — GLUCOSE, CAPILLARY: Glucose-Capillary: 138 mg/dL — ABNORMAL HIGH (ref 65–99)

## 2015-03-13 SURGERY — A/V SHUNTOGRAM/FISTULAGRAM
Anesthesia: Moderate Sedation

## 2015-03-13 MED ORDER — CEFAZOLIN SODIUM-DEXTROSE 2-3 GM-% IV SOLR
2.0000 g | Freq: Once | INTRAVENOUS | Status: AC
  Administered 2015-03-13: 2 g via INTRAVENOUS

## 2015-03-13 MED ORDER — CEFAZOLIN SODIUM 1-5 GM-% IV SOLN
INTRAVENOUS | Status: AC
  Filled 2015-03-13: qty 50

## 2015-03-13 MED ORDER — MORPHINE SULFATE (PF) 4 MG/ML IV SOLN: 2.0000 mg | INTRAVENOUS | Status: DC | PRN

## 2015-03-13 MED ORDER — MIDAZOLAM HCL 2 MG/2ML IJ SOLN
INTRAMUSCULAR | Status: DC | PRN
Start: 2015-03-13 — End: 2015-03-13
  Administered 2015-03-13: 1 mg via INTRAVENOUS
  Administered 2015-03-13: 2 mg via INTRAVENOUS

## 2015-03-13 MED ORDER — ALUM & MAG HYDROXIDE-SIMETH 200-200-20 MG/5ML PO SUSP: 15.0000 mL | ORAL | Status: DC | PRN

## 2015-03-13 MED ORDER — HEPARIN SODIUM (PORCINE) 1000 UNIT/ML IJ SOLN
INTRAMUSCULAR | Status: DC | PRN
  Administered 2015-03-13: 3000 [IU] via INTRAVENOUS

## 2015-03-13 MED ORDER — MIDAZOLAM HCL 5 MG/5ML IJ SOLN
INTRAMUSCULAR | Status: AC
  Filled 2015-03-13: qty 5

## 2015-03-13 MED ORDER — HEPARIN SODIUM (PORCINE) 1000 UNIT/ML IJ SOLN
INTRAMUSCULAR | Status: AC
  Filled 2015-03-13: qty 1

## 2015-03-13 MED ORDER — SODIUM CHLORIDE 0.9 % IV SOLN
INTRAVENOUS | Status: DC
  Administered 2015-03-13 (×2): via INTRAVENOUS

## 2015-03-13 MED ORDER — ACETAMINOPHEN 325 MG PO TABS
325.0000 mg | ORAL_TABLET | ORAL | Status: DC | PRN
Start: 2015-03-13 — End: 2015-03-13

## 2015-03-13 MED ORDER — ACETAMINOPHEN 325 MG RE SUPP
325.0000 mg | RECTAL | Status: DC | PRN
Start: 2015-03-13 — End: 2015-03-13

## 2015-03-13 MED ORDER — OXYCODONE-ACETAMINOPHEN 5-325 MG PO TABS: 1.0000 | ORAL_TABLET | ORAL | Status: DC | PRN

## 2015-03-13 MED ORDER — ONDANSETRON HCL 4 MG/2ML IJ SOLN: 4.0000 mg | Freq: Four times a day (QID) | INTRAMUSCULAR | Status: DC | PRN

## 2015-03-13 MED ORDER — METOPROLOL TARTRATE 1 MG/ML IV SOLN: 2.0000 mg | INTRAVENOUS | Status: DC | PRN

## 2015-03-13 MED ORDER — IOHEXOL 300 MG/ML  SOLN
INTRAMUSCULAR | Status: DC | PRN
Start: 2015-03-13 — End: 2015-03-13
  Administered 2015-03-13: 25 mL via INTRAVENOUS

## 2015-03-13 MED ORDER — PHENOL 1.4 % MT LIQD: 1.0000 | OROMUCOSAL | Status: DC | PRN

## 2015-03-13 MED ORDER — LIDOCAINE-EPINEPHRINE (PF) 1 %-1:200000 IJ SOLN
INTRAMUSCULAR | Status: AC
  Filled 2015-03-13: qty 30

## 2015-03-13 MED ORDER — HEPARIN (PORCINE) IN NACL 2-0.9 UNIT/ML-% IJ SOLN
INTRAMUSCULAR | Status: AC
  Filled 2015-03-13: qty 1000

## 2015-03-13 MED ORDER — LABETALOL HCL 5 MG/ML IV SOLN: 10.0000 mg | INTRAVENOUS | Status: DC | PRN

## 2015-03-13 MED ORDER — FENTANYL CITRATE (PF) 100 MCG/2ML IJ SOLN
INTRAMUSCULAR | Status: AC
  Filled 2015-03-13: qty 2

## 2015-03-13 MED ORDER — GUAIFENESIN-DM 100-10 MG/5ML PO SYRP: 15.0000 mL | ORAL_SOLUTION | ORAL | Status: DC | PRN

## 2015-03-13 MED ORDER — FENTANYL CITRATE (PF) 100 MCG/2ML IJ SOLN
INTRAMUSCULAR | Status: DC | PRN
  Administered 2015-03-13 (×2): 50 ug via INTRAVENOUS

## 2015-03-13 MED ORDER — HYDRALAZINE HCL 20 MG/ML IJ SOLN: 5.0000 mg | INTRAMUSCULAR | Status: DC | PRN

## 2015-03-13 SURGICAL SUPPLY — 10 items
BALLN DORADO 8X60X80 (BALLOONS) ×3
BALLN LUTONIX DCB 7X60X130 (BALLOONS) ×3
BALLOON DORADO 8X60X80 (BALLOONS) ×1 IMPLANT
BALLOON LUTONIX DCB 7X60X130 (BALLOONS) ×1 IMPLANT
DEVICE PRESTO INFLATION (MISCELLANEOUS) ×3 IMPLANT
GLIDEWIRE ADV .035X260CM (WIRE) ×3 IMPLANT
PACK ANGIOGRAPHY (CUSTOM PROCEDURE TRAY) ×3 IMPLANT
SET INTRO CAPELLA COAXIAL (SET/KITS/TRAYS/PACK) ×3 IMPLANT
SHEATH BRITE TIP 6FRX5.5 (SHEATH) ×3 IMPLANT
TOWEL OR 17X26 4PK STRL BLUE (TOWEL DISPOSABLE) ×3 IMPLANT

## 2015-03-13 NOTE — Op Note (Signed)
[facility]    OPERATIVE NOTE   PROCEDURE: 1.   Left brachiocephalic arteriovenous fistula cannulation under ultrasound guidance 2.   Left arm fistulagram including central venogram 3.   Percutaneous transluminal angioplasty of cephalic vein near subclavian vein confluence with 7 mm diameter drug-coated an 8 mm diameter high pressure angioplasty balloon  PRE-OPERATIVE DIAGNOSIS: 1. ESRD 2. Poorly functional left brachiocephalic AVF  POST-OPERATIVE DIAGNOSIS: same as above   SURGEON: Leotis Pain, MD  ANESTHESIA: local with MCS  ESTIMATED BLOOD LOSS: Minimal  FINDING(S): 1. Vein somewhat tortuous and deep, moderate stenosis and cephalic vein about 3-4 cm before it joins the subclavian vein  SPECIMEN(S):  None  CONTRAST: 25 cc  INDICATIONS: [name] is a 59 y.o. female who presents with malfunctioning  left brachiocephalic arteriovenous fistula.  The patient is scheduled for  left arm fistulagram.  The patient is aware the risks include but are not limited to: bleeding, infection, thrombosis of the cannulated access, and possible anaphylactic reaction to the contrast.  The patient is aware of the risks of the procedure and elects to proceed forward.  DESCRIPTION: After full informed written consent was obtained, the patient was brought back to the angiography suite and placed supine upon the angiography table.  The patient was connected to monitoring equipment.  The  left arm was prepped and draped in the standard fashion for a percutaneous access intervention.  Under ultrasound guidance, the  initial portion of the arteriovenous fistula was cannulated with a micropuncture needle under direct ultrasound guidance in an antegrade fashion and a permanent image was performed.  The microwire was advanced into the fistula and the needle was exchanged for the a microsheath.  I then upsized to a 6 Fr Sheath and imaging was performed.  Hand injections were  completed to image the access including the central venous system. This demonstrated a somewhat tortuous cephalic vein with a moderate stenosis in the cephalic vein about 3-4 cm before it joins subclavian vein. This appeared to be in the 65-70% range.  Based on the images, this patient will need angioplasty of this area. I then gave the patient 3000 units of intravenous heparin.  I then crossed the stenosis with a Magic Tourqe wire.  Based on the imaging, a 7 mm x 6 cm  Lutonix drug-coated angioplasty balloon was selected.  The balloon was centered around the cephalic vein stenosis and inflated to 12 ATM for 1 minute(s) but the waist never entirely resolved significant stenosis was still seen on completion imaging. I then upsized to an 8 mm diameter by 6 cm length high pressure angioplasty balloon and inflated this to 18 atm for 45 seconds. This resolved the waist..  On completion imaging, a 15-20 % residual stenosis was present.     Based on the completion imaging, no further intervention is necessary.  The wire and balloon were removed from the sheath.  A 4-0 Monocryl purse-string suture was sewn around the sheath.  The sheath was removed while tying down the suture.  A sterile bandage was applied to the puncture site.  COMPLICATIONS: None  CONDITION: Stable   [name]  03/13/2015 11:54 AM

## 2015-03-13 NOTE — H&P (Signed)
[facility] Admission History & Physical  MRN : [id number]  [name] is a 59 y.o. (1956/07/01) female who presents with chief complaint of No chief complaint on file. Marland Kitchen  History of Present Illness: Patient with ESRD on dialysis whose left arm access is not working well at dialysis.  She has no complaints, but dialysis center is unhappy with flow and referred her for fistulagram  Current Facility-Administered Medications  Medication Dose Route Frequency Provider Last Rate Last Dose  . 0.9 %  sodium chloride infusion   Intravenous Continuous Annice Needy, MD 20 mL/hr at 03/13/15 1002    . ceFAZolin (ANCEF) IVPB 2 g/50 mL premix  2 g Intravenous Once Annice Needy, MD        Past Medical History  Diagnosis Date  . Chronic kidney disease   . Hypertension   . Diabetes mellitus without complication     Past Surgical History  Procedure Laterality Date  . Eye surgery  bil cataracts  . Av fistula placement Left 11/23/2014    Procedure: ARTERIOVENOUS (AV) FISTULA CREATION;  Surgeon: Annice Needy, MD;  Location: [facility] ORS;  Service: Vascular;  Laterality: Left;  . Peripheral vascular catheterization N/A 02/10/2015    Procedure: Dialysis/Perma Catheter Insertion;  Surgeon: Annice Needy, MD;  Location: [facility] INVASIVE CV LAB;  Service: Cardiovascular;  Laterality: N/A;    Social History Social History  Substance Use Topics  . Smoking status: Never Smoker   . Smokeless tobacco: Never Used  . Alcohol Use: No  No IVDU  Family History No history of bleeding disorders, clotting disorders, autoimmune diseases, or porphyrias  Allergies  Allergen Reactions  . Codeine Nausea Only    hydrocodone  . Vicodin [Hydrocodone-Acetaminophen] Nausea And Vomiting     REVIEW OF SYSTEMS (Negative unless checked)  Constitutional: [] Weight loss  [] Fever  [] Chills Cardiac: [] Chest pain   [] Chest pressure   [] Palpitations   [] Shortness of breath when laying flat    [] Shortness of breath at rest   [] Shortness of breath with exertion. Vascular:  [x] Pain in legs with walking   [] Pain in legs at rest   [] Pain in legs when laying flat   [] Claudication   [] Pain in feet when walking  [] Pain in feet at rest  [] Pain in feet when laying flat   [] History of DVT   [] Phlebitis   [] Swelling in legs   [] Varicose veins   [] Non-healing ulcers Pulmonary:   [] Uses home oxygen   [] Productive cough   [] Hemoptysis   [] Wheeze  [] COPD   [] Asthma Neurologic:  [] Dizziness  [] Blackouts   [] Seizures   [] History of stroke   [] History of TIA  [] Aphasia   [] Temporary blindness   [] Dysphagia   [] Weakness or numbness in arms   [] Weakness or numbness in legs Musculoskeletal:  [] Arthritis   [] Joint swelling   [] Joint pain   [] Low back pain Hematologic:  [] Easy bruising  [] Easy bleeding   [] Hypercoagulable state   [] Anemic  [] Hepatitis Gastrointestinal:  [] Blood in stool   [] Vomiting blood  [] Gastroesophageal reflux/heartburn   [] Difficulty swallowing. Genitourinary:  [x] Chronic kidney disease   [] Difficult urination  [] Frequent urination  [] Burning with urination   [] Blood in urine Skin:  [] Rashes   [x] Ulcers   [x] Wounds Psychological:  [] History of anxiety   []  History of major depression.  Physical Examination  Filed Vitals:   03/13/15 0943  BP: 135/79  Pulse: 77  Temp: 98.2 F (36.8 C)  TempSrc: Oral  Resp: 27  Height:  (1.727 m)  Weight: 117.935 kg (260 lb)  SpO2: 100%   Body mass index is 39.54 kg/(m^2). Gen: WD/WN, NAD. obese Head: [place]/AT, No temporalis wasting. Prominent temp pulse not noted. Ear/Nose/Throat: Hearing grossly intact, nares w/o erythema or drainage, oropharynx w/o Erythema/Exudate,  Eyes: PERRLA, EOMI.  Neck: Supple, no nuchal rigidity.  No bruit or JVD.  Pulmonary:  Good air movement, clear to auscultation bilaterally, no use of accessory muscles.  Cardiac: RRR, normal S1, S2, no Murmurs, rubs or gallops. Vascular: thrill and bruit present in access  left arm Vessel Right Left  Radial Palpable Palpable                                   Gastrointestinal: soft, non-tender/non-distended. No guarding/reflex.  Musculoskeletal: M/S 5/5 throughout.  Extremities without ischemic changes.  No deformity or atrophy.  Neurologic: CN 2-12 intact. Pain and light touch intact in extremities.  Symmetrical.  Speech is fluent. Motor exam as listed above. Psychiatric: Judgment intact, Mood & affect appropriate for pt's clinical situation. Dermatologic: No rashes or ulcers noted.  No cellulitis or open wounds. Lymph : No Cervical, Axillary, or Inguinal lymphadenopathy.      CBC Lab Results  Component Value Date   WBC 10.2 11/21/2014   HGB 10.5* 11/21/2014   HCT 32.6* 11/21/2014   MCV 86.6 11/21/2014   PLT 315 11/21/2014    BMET    Component Value Date/Time   NA 140 11/21/2014 1044   NA 141 11/04/2011 0439   K 4.1 02/10/2015 1441   K 3.5 11/04/2011 0439   CL 107 11/21/2014 1044   CL 103 11/04/2011 0439   CO2 25 11/21/2014 1044   CO2 27 11/04/2011 0439   GLUCOSE 136* 11/21/2014 1044   GLUCOSE 117* 11/04/2011 0439   BUN 45* 11/21/2014 1044   BUN 33* 11/04/2011 0439   CREATININE 4.19* 11/21/2014 1044   CREATININE 5.32* 11/04/2011 0439   CALCIUM 9.1 11/21/2014 1044   CALCIUM 7.7* 11/04/2011 0439   GFRNONAA 11* 11/21/2014 1044   GFRNONAA 8* 11/04/2011 0439   GFRAA 12* 11/21/2014 1044   GFRAA 10* 11/04/2011 0439   CrCl cannot be calculated (Patient has no serum creatinine result on file.).  COAG Lab Results  Component Value Date   INR 1.08 11/21/2014    Radiology No results found.   Assessment/Plan 1. ESRD: on HD.   2. Complication of dialysis access: For fistulagram today.  Risks and benefits discussed and she is agreeable to proceed 3. DM: stable.  Continue outpatient meds   DEW,JASON, MD  03/13/2015 11:09 AM

## 2015-03-13 NOTE — Progress Notes (Signed)
Dr Wyn Quaker in to speak with pt with questions answered, tolerated procedure well, access left arm may now be used, son present, taking po's without difficulty, ready for discharge.

## 2015-03-14 ENCOUNTER — Ambulatory Visit: Payer: BC Managed Care – PPO | Admitting: General Surgery

## 2015-03-14 ENCOUNTER — Encounter: Payer: Self-pay | Admitting: Vascular Surgery

## 2015-03-16 ENCOUNTER — Encounter: Payer: BC Managed Care – PPO | Attending: Surgery | Admitting: Surgery

## 2015-03-16 DIAGNOSIS — N184 Chronic kidney disease, stage 4 (severe): Secondary | ICD-10-CM | POA: Diagnosis not present

## 2015-03-16 DIAGNOSIS — E11622 Type 2 diabetes mellitus with other skin ulcer: Secondary | ICD-10-CM | POA: Diagnosis not present

## 2015-03-16 DIAGNOSIS — Z992 Dependence on renal dialysis: Secondary | ICD-10-CM | POA: Diagnosis not present

## 2015-03-16 DIAGNOSIS — I129 Hypertensive chronic kidney disease with stage 1 through stage 4 chronic kidney disease, or unspecified chronic kidney disease: Secondary | ICD-10-CM | POA: Insufficient documentation

## 2015-03-16 DIAGNOSIS — L97222 Non-pressure chronic ulcer of left calf with fat layer exposed: Secondary | ICD-10-CM | POA: Diagnosis not present

## 2015-03-16 DIAGNOSIS — L97212 Non-pressure chronic ulcer of right calf with fat layer exposed: Secondary | ICD-10-CM | POA: Insufficient documentation

## 2015-03-16 NOTE — Progress Notes (Addendum)
[name], [name] ([id number]) Visit Report for 03/16/2015 Chief Complaint Document Details Patient Name: [name], [name]. Date of Service: 03/16/2015 4:00 PM Medical Record Number: [id number] Patient Account Number: [id number] Date of Birth/Sex: 04-19-1956 (59 y.o. Female) Treating RN: Curtis Sites Primary Care Physician: Fidel Levy Other Clinician: Referring Physician: Fidel Levy Treating Physician/Extender: Rudene Re in Treatment: 6 Information Obtained from: Patient Chief Complaint Patient presents to the wound care center for a consult due non healing wound. This 59 year old patient has had ulcerated areas on the left lower extremity more than the right lower extremity to very painful for the last 3 weeks. Electronic Signature(s) Signed: 03/16/2015 4:45:45 PM By: Evlyn Kanner MD, FACS Entered By: Evlyn Kanner on 03/16/2015 16:45:45 Rigor, [name] ([id number]) -------------------------------------------------------------------------------- Debridement Details Patient Name: [name], [name] Date of Service: 03/16/2015 4:00 PM Medical Record Number: [id number] Patient Account Number: [id number] Date of Birth/Sex: 1955-08-31 (59 y.o. Female) Treating RN: Curtis Sites Primary Care Physician: Fidel Levy Other Clinician: Referring Physician: Fidel Levy Treating Physician/Extender: Rudene Re in Treatment: 6 Debridement Performed for Wound #3 Left,Lateral Lower Leg Assessment: Performed By: Physician Tristan Schroeder., MD Debridement: Debridement Pre-procedure Yes Verification/Time Out Taken: Start Time: 16:27 Pain Control: Lidocaine 4% Topical Solution Level: Skin/Subcutaneous Tissue Total Area Debrided (L x 8 (cm) x 6 (cm) = 48 (cm) W): Tissue and other Viable, Non-Viable, Eschar, Fibrin/Slough, Subcutaneous material debrided: Instrument: Forceps, Scissors Bleeding: Moderate Hemostasis Achieved: Silver  Nitrate End Time: 16:34 Procedural Pain: 0 Post Procedural Pain: 0 Response to Treatment: Procedure was tolerated well Post Debridement Measurements of Total Wound Length: (cm) 23 Width: (cm) 14 Depth: (cm) 0.5 Volume: (cm) 126.449 Post Procedure Diagnosis Same as Pre-procedure Electronic Signature(s) Signed: 03/16/2015 4:45:39 PM By: Evlyn Kanner MD, FACS Signed: 03/16/2015 5:38:26 PM By: Curtis Sites Entered By: Evlyn Kanner on 03/16/2015 16:45:39 Dahmer, [name] ([id number]) -------------------------------------------------------------------------------- HPI Details Patient Name: [name], [name] Date of Service: 03/16/2015 4:00 PM Medical Record Number: [id number] Patient Account Number: [id number] Date of Birth/Sex: 09/02/1955 (59 y.o. Female) Treating RN: Curtis Sites Primary Care Physician: Fidel Levy Other Clinician: Referring Physician: Fidel Levy Treating Physician/Extender: Rudene Re in Treatment: 6 History of Present Illness Location: Painful ulcerated areas left lower extremity more than right lower extremity Quality: Patient reports experiencing a sharp pain to affected area(s). Severity: Patient states wound are getting worse. Duration: Patient has had the wound for < 4 weeks prior to presenting for treatment Timing: Pain in wound is Intermittent (comes and goes Context: The wound appeared gradually over time Modifying Factors: Other treatment(s) tried include: she has had Keflex for about 10 days. Associated Signs and Symptoms: Patient reports having difficulty standing for long periods. HPI Description: 59 year old patient recently seen by her PCP Dr. Venora Maples who saw her on 01/25/2015 4 ulcers both lower extremities which have been present for about 3 weeks. She took a ten-day course of Keflex and continues to have leg pain and swelling. Past medical history significant for chronic kidney disease, hypertension and diabetes  mellitus without complications. She has never been a smoker. Mostly recent CBC was within normal limits, BMP showed that her glucose was 136 and a beard was 45 and a creatinine is 4.19. The patient has had chronic renal failure and had a left AV fistula placed by Dr. Wyn Quaker in May and she is getting ready for hemodialysis soon. She has not been put on any specific medications for this problem. Addendum: tried to  get Dr. 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 all questions answered. 02/07/2015 -- the patient started hemodialysis yesterday but was not successful and is going to be seeing Dr. Wyn Quaker regain. She also has a vascular workup for early August. No further investigations or referrals have been made after my conversation with her PCPs office last week. I have asked the patient to call her PCPs office and discuss further investigations and referrals. 02/14/2015 -- she has had 2 sessions of hemodialysis via the right IJ dialysis catheter. She has not seen her PCP or Dr. Wyn Quaker yet. Part of a vascular workup was done today and the next is sometime later in the month. Unfortunately she does not agree to any surgical intervention at this stage and I have not been able to refer her to a surgeon for debridement in the OR. 02/21/2015 -- she has not decided to proceed with surgery yet and is going to see her PCP tomorrow for more discussions. I have offered to talk to her specialists at any time and she does understand that. 03/07/15 -- she has come to see as back after 2 weeks and says that she can only come once in 2 weeks because of social issues. This time around she agrees to see Dr. dew and discuss surgical intervention and I will put in a call to him. Addendum: I got to speak to Dr. Festus Barren who kindly agreed to take her up to the OR  for a debridement and punch biopsies. He will also work on her peripheral vascular workup to see if any angioplasty will help. 03/16/2015 cultures taken on 823 showed that she had a heavy growth of Stenotrophomonas maltophilia and enterococcus species. These were sensitive to levofloxacin, Septra, ampicillin. The patient is already [facility], [place] H. ([id number]) on doxycycline. she was taken to the OR on 03/13/2015 for a angioplasty of her left arm AV fistula, which treated the cephalic vein near the subclavian vein confluence. The patient did not have any debridement of her left lower extremity and no biopsies were taken during the procedure done by Dr. Wyn Quaker. Electronic Signature(s) Signed: 03/16/2015 4:48:06 PM By: Evlyn Kanner MD, FACS Previous Signature: 03/16/2015 12:04:57 PM Version By: Evlyn Kanner MD, FACS Previous Signature: 03/16/2015 11:41:17 AM Version By: Evlyn Kanner MD, FACS Entered By: Evlyn Kanner on 03/16/2015 16:48:06 Gajda, [name] ([id number]) -------------------------------------------------------------------------------- Physical Exam Details Patient Name: [name], [name] Date of Service: 03/16/2015 4:00 PM Medical Record Number: [id number] Patient Account Number: [id number] Date of Birth/Sex: 1956-01-20 (59 y.o. Female) Treating RN: Curtis Sites Primary Care Physician: Fidel Levy Other Clinician: Referring Physician: Fidel Levy Treating Physician/Extender: Rudene Re in Treatment: 6 Constitutional . Pulse regular. Respirations normal and unlabored. Afebrile. . Eyes Nonicteric. Reactive to light. Ears, Nose, Mouth, and Throat Lips, teeth, and gums WNL.[name] Moist mucosa without lesions . Neck supple and nontender. No palpable supraclavicular or cervical adenopathy. Normal sized without goiter. Respiratory WNL. No retractions.. Cardiovascular Pedal Pulses WNL. No clubbing, cyanosis or edema. Gastrointestinal (GI) Abdomen without  masses or tenderness.. No liver or spleen enlargement or tenderness.. Genitourinary (GU) No hydrocele, spermatocele, tenderness of the cord, or testicular mass.[name] Penis without lesions.[name] without lesions. No cystocele, or rectocele. Pelvic support intact, no discharge. [name] Urethra without masses, tenderness or scarring.[name] Lymphatic No adneopathy. No  adenopathy. No adenopathy. Musculoskeletal Adexa without tenderness or enlargement.. Digits and nails w/o clubbing, cyanosis, infection, petechiae, ischemia, or inflammatory conditions.. Integumentary (Hair, Skin) No suspicious lesions. No crepitus or fluctuance. No peri-wound warmth or erythema. No masses.[name] Psychiatric Judgement and insight Intact.. No evidence of depression, anxiety, or agitation.. Notes There is significant amount of eschar on the left lower extremity and there is element of infection in spite of her doxycycline. Some of the eschar is loosened up and I will sharply debride this down to the subcutaneous tissue. Electronic Signature(s) Signed: 03/16/2015 4:49:16 PM By: Evlyn Kanner MD, FACS Vanpatten, [name] ([id number]) Entered By: Evlyn Kanner on 03/16/2015 16:49:16 [name], [name] ([id number]) -------------------------------------------------------------------------------- Physician Orders Details Patient Name: [name], [name] Date of Service: 03/16/2015 4:00 PM Medical Record Number: [id number] Patient Account Number: [id number] Date of Birth/Sex: [birth year] (59 y.o. Female) Treating RN: Curtis Sites Primary Care Physician: Fidel Levy Other Clinician: Referring Physician: Fidel Levy Treating Physician/Extender: Rudene Re in Treatment: 6 Verbal / Phone Orders: Yes Clinician: Curtis Sites Read Back and Verified: Yes Diagnosis Coding Wound Cleansing Wound #1 Right,Proximal Lower Leg o Clean wound with Normal Saline. o May Shower, gently pat wound dry prior to applying  new dressing. Wound #2 Right,Distal Lower Leg o Clean wound with Normal Saline. o May Shower, gently pat wound dry prior to applying new dressing. Wound #3 Left,Lateral Lower Leg o Clean wound with Normal Saline. o May Shower, gently pat wound dry prior to applying new dressing. Anesthetic Wound #1 Right,Proximal Lower Leg o Topical Lidocaine 4% cream applied to wound bed prior to debridement Wound #2 Right,Distal Lower Leg o Topical Lidocaine 4% cream applied to wound bed prior to debridement Wound #3 Left,Lateral Lower Leg o Topical Lidocaine 4% cream applied to wound bed prior to debridement Primary Wound Dressing Wound #1 Right,Proximal Lower Leg o Santyl Ointment Wound #2 Right,Distal Lower Leg o Santyl Ointment Wound #3 Left,Lateral Lower Leg o Santyl Ointment Secondary Dressing Wound #1 Right,Proximal Lower Leg o Gauze, ABD and Kerlix/Conform Lisle, Chamika H. ([id number]) Wound #2 Right,Distal Lower Leg o Gauze, ABD and Kerlix/Conform Wound #3 Left,Lateral Lower Leg o Gauze, ABD and Kerlix/Conform Dressing Change Frequency Wound #1 Right,Proximal Lower Leg o Change Dressing Monday, Wednesday, Friday Wound #2 Right,Distal Lower Leg o Change Dressing Monday, Wednesday, Friday Wound #3 Left,Lateral Lower Leg o Change Dressing Monday, Wednesday, Friday Follow-up Appointments Wound #1 Right,Proximal Lower Leg o Return Appointment in 1 week. Wound #2 Right,Distal Lower Leg o Return Appointment in 1 week. Wound #3 Left,Lateral Lower Leg o Return Appointment in 1 week. Edema Control Wound #1 Right,Proximal Lower Leg o Tubigrip Wound #2 Right,Distal Lower Leg o Tubigrip Wound #3 Left,Lateral Lower Leg o Tubigrip Home Health Wound #1 Right,Proximal Lower Leg o Continue Home Health Visits - [facility] o Home Health Nurse may visit PRN to address patientos wound care needs. o FACE TO FACE ENCOUNTER: MEDICARE and  MEDICAID PATIENTS: I certify that this patient is under my care and that I had a face-to-face encounter that meets the physician face-to-face encounter requirements with this patient on this date. The encounter with the patient was in whole or in part for the following MEDICAL CONDITION: (primary reason for Home Healthcare) MEDICAL NECESSITY: I certify, that based on my findings, NURSING services are a medically necessary home health service. HOME BOUND STATUS: I certify that my clinical findings support that this patient is homebound (i.e., Due to illness or injury, pt requires aid of Kassner, Aina  H. ([id number]) supportive devices such as crutches, cane, wheelchairs, walkers, the use of special transportation or the assistance of another person to leave their place of residence. There is a normal inability to leave the home and doing so requires considerable and taxing effort. Other absences are for medical reasons / religious services and are infrequent or of short duration when for other reasons). o If current dressing causes regression in wound condition, may D/C ordered dressing product/s and apply Normal Saline Moist Dressing daily until next Wound Healing Center / Other MD appointment. Notify Wound Healing Center of regression in wound condition at [phone]. o Please direct any NON-WOUND related issues/requests for orders to patient's Primary Care Physician Wound #2 Right,Distal Lower Leg o Continue Home Health Visits - [facility] o Home Health Nurse may visit PRN to address patientos wound care needs. o FACE TO FACE ENCOUNTER: MEDICARE and MEDICAID PATIENTS: I certify that this patient is under my care and that I had a face-to-face encounter that meets the physician face-to-face encounter requirements with this patient on this date. The encounter with the patient was in whole or in part for the following MEDICAL CONDITION: (primary reason for Home Healthcare) MEDICAL  NECESSITY: I certify, that based on my findings, NURSING services are a medically necessary home health service. HOME BOUND STATUS: I certify that my clinical findings support that this patient is homebound (i.e., Due to illness or injury, pt requires aid of supportive devices such as crutches, cane, wheelchairs, walkers, the use of special transportation or the assistance of another person to leave their place of residence. There is a normal inability to leave the home and doing so requires considerable and taxing effort. Other absences are for medical reasons / religious services and are infrequent or of short duration when for other reasons). o If current dressing causes regression in wound condition, may D/C ordered dressing product/s and apply Normal Saline Moist Dressing daily until next Wound Healing Center / Other MD appointment. Notify Wound Healing Center of regression in wound condition at [phone]. o Please direct any NON-WOUND related issues/requests for orders to patient's Primary Care Physician Wound #3 Left,Lateral Lower Leg o Continue Home Health Visits - [facility] o Home Health Nurse may visit PRN to address patientos wound care needs. o FACE TO FACE ENCOUNTER: MEDICARE and MEDICAID PATIENTS: I certify that this patient is under my care and that I had a face-to-face encounter that meets the physician face-to-face encounter requirements with this patient on this date. The encounter with the patient was in whole or in part for the following MEDICAL CONDITION: (primary reason for Home Healthcare) MEDICAL NECESSITY: I certify, that based on my findings, NURSING services are a medically necessary home health service. HOME BOUND STATUS: I certify that my clinical findings support that this patient is homebound (i.e., Due to illness or injury, pt requires aid of supportive devices such as crutches, cane, wheelchairs, walkers, the use of special transportation or the  assistance of another person to leave their place of residence. There is a normal inability to leave the home and doing so requires considerable and taxing effort. Other absences are for medical reasons / religious services and are infrequent or of short duration when for other reasons). o If current dressing causes regression in wound condition, may D/C ordered dressing product/s and apply Normal Saline Moist Dressing daily until next Wound Healing Center / Other MD appointment. Notify Wound Healing Center of regression in wound condition at [phone]. Stall, [name] ([id number]) o  Please direct any NON-WOUND related issues/requests for orders to patient's Primary Care Physician Medications-please add to medication list. Wound #1 Right,Proximal Lower Leg o P.O. Antibiotics - doxycycline o Santyl Enzymatic Ointment Wound #2 Right,Distal Lower Leg o P.O. Antibiotics - doxycycline o Santyl Enzymatic Ointment Wound #3 Left,Lateral Lower Leg o P.O. Antibiotics - doxycycline o Santyl Enzymatic Ointment Patient Medications Allergies: Vicodin Notifications Medication Indication Start End Santyl 03/16/2015 DOSE topical 250 unit/gram ointment - ointment topical Bactrim DS 03/16/2015 DOSE 1 - oral 800 mg-160 mg tablet - 1 tablet oral bid ampicillin 03/16/2015 DOSE 1 - oral 500 mg capsule - 1 capsule oral four times daily Electronic Signature(s) Signed: 03/16/2015 4:56:38 PM By: Evlyn Kanner MD, FACS Entered By: Evlyn Kanner on 03/16/2015 16:56:38 Brandenberger, Gwyndolyn [name] ([id number]) -------------------------------------------------------------------------------- Problem List Details Patient Name: Rede, Tymia H. Date of Service: 03/16/2015 4:00 PM Medical Record Number: [id number] Patient Account Number: [id number] Date of Birth/Sex: 08-07-1955 (59 y.o. 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 Date of Service: 03/16/2015 4:00 PM Medical Record Number: [id number] Patient Account Number: [id number] Date of Birth/Sex: 1955/12/10 (59 y.o. Female) Treating RN: Curtis Sites Primary Care Physician: Fidel Levy Other Clinician: Referring Physician: Fidel Levy Treating Physician/Extender: Rudene Re in Treatment: 6 Subjective Chief Complaint Information obtained from Patient Patient presents to the wound care center for a consult due non healing wound. This 59 year old patient has had ulcerated areas on the left lower extremity more than the right lower extremity to very painful for the last 3 weeks. History of Present Illness (HPI) The following HPI elements were documented for the patient's wound: Location: Painful ulcerated areas left lower extremity more than right lower extremity Quality: Patient reports experiencing a sharp pain to affected area(s). Severity: Patient states wound are getting worse. Duration: Patient has had the wound for < 4 weeks prior to presenting for treatment Timing: Pain in wound is Intermittent (comes and goes Context: The wound appeared  gradually over time Modifying Factors: Other treatment(s) tried include: she has had Keflex for about 10 days. Associated Signs and Symptoms: Patient reports having difficulty standing for long periods. 59 year old patient recently seen by her PCP Dr. Venora Maples who saw her on 01/25/2015 4 ulcers both lower extremities which have been present for about 3 weeks. She took a ten-day course of Keflex and continues to have leg pain and swelling. Past medical history significant for chronic kidney disease, hypertension and diabetes mellitus without complications. She has never been a smoker. Mostly recent CBC was within normal limits, BMP showed that her glucose was 136 and a beard was 45 and a creatinine is 4.19. The patient has had chronic renal failure and had a left AV fistula placed by Dr. Wyn Quaker in May and she is getting ready for hemodialysis soon. She has not been put on any specific medications for this problem.  Addendum: tried to get Dr. 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 all questions answered. 02/07/2015 -- the patient started hemodialysis yesterday but was not successful and is going to be seeing Dr. Wyn Quaker regain. She also has a vascular workup for early August. No further investigations or referrals have been made after my conversation with her PCPs office last week. I have asked the patient to call her PCPs office and discuss further investigations and referrals. 02/14/2015 -- she has had 2 sessions of hemodialysis via the right IJ dialysis catheter. She has not seen her PCP or Dr. Wyn Quaker yet. Part of a vascular workup was done today and the next is sometime later in the month. Unfortunately she does not agree to any surgical intervention at this stage and I have not been able to refer her to a surgeon for debridement in  the OR. 02/21/2015 -- she has not decided to proceed with surgery yet and is going to see her PCP tomorrow for [facility], [place] H. ([id number]) more discussions. I have offered to talk to her specialists at any time and she does understand that. 03/07/15 -- she has come to see as back after 2 weeks and says that she can only come once in 2 weeks because of social issues. This time around she agrees to see Dr. dew and discuss surgical intervention and I will put in a call to him. Addendum: I got to speak to Dr. Festus Barren who kindly agreed to take her up to the OR for a debridement and punch biopsies. He will also work on her peripheral vascular workup to see if any angioplasty will help. 03/16/2015 cultures taken on 823 showed that she had a heavy growth of Stenotrophomonas maltophilia and enterococcus species. These were sensitive to levofloxacin, Septra, ampicillin. The patient is already on doxycycline. she was taken to the OR on 03/13/2015 for a angioplasty of her left arm AV fistula, which treated the cephalic vein near the subclavian vein confluence. The patient did not have any debridement of her left lower extremity and no biopsies were taken during the procedure done by Dr. Wyn Quaker. Objective Constitutional Pulse regular. Respirations normal and unlabored. Afebrile. Vitals Time Taken: 3:59 PM, Height: 68 in, Weight: 294 lbs, BMI: 44.7, Temperature: 98.7 F, Pulse: 85 bpm, Respiratory Rate: 18 breaths/min, Blood Pressure: 113/57 mmHg. Eyes Nonicteric. Reactive to light. Ears, Nose, Mouth, and Throat Lips, teeth, and gums WNL.[name] Moist mucosa without lesions . Neck supple and nontender. No palpable supraclavicular or cervical adenopathy. Normal sized without goiter. Respiratory WNL. No retractions.. Cardiovascular Pedal Pulses WNL. No clubbing, cyanosis or edema. Gastrointestinal (GI) Abdomen without masses or tenderness.. No liver or spleen enlargement or tenderness.. Genitourinary  (GU) No hydrocele, spermatocele, tenderness of the cord, or testicular mass.[name] Penis without lesions.[name] [name], [name] ([id number]) without lesions. No cystocele, or rectocele. Pelvic support intact, no discharge. [name] Urethra without masses, tenderness or scarring.[name] Lymphatic No adneopathy. No adenopathy. No adenopathy. Musculoskeletal Adexa without tenderness or enlargement.. Digits and nails w/o clubbing, cyanosis, infection, petechiae, ischemia, or inflammatory conditions.[name] Psychiatric Judgement and insight Intact.. No evidence of depression, anxiety, or agitation.. General Notes: There is significant amount of eschar on the left lower extremity and there is element of infection in spite of her doxycycline. Some of the eschar is loosened up and I  will sharply debride this down to the subcutaneous tissue. Integumentary (Hair, Skin) No suspicious lesions. No crepitus or fluctuance. No peri-wound warmth or erythema. No masses.. Wound #1 status is Open. Original cause of wound was Gradually Appeared. The wound is located on the Right,Proximal Lower Leg. The wound measures 2.1cm length x 2.2cm width x 0.2cm depth; 3.629cm^2 area and 0.726cm^3 volume. The wound is limited to skin breakdown. There is no tunneling or undermining noted. There is a small amount of serosanguineous drainage noted. The wound margin is distinct with the outline attached to the wound base. There is no granulation within the wound bed. There is a large (67- 100%) amount of necrotic tissue within the wound bed including Eschar. The periwound skin appearance exhibited: Localized Edema, Dry/Scaly, Moist. The periwound skin appearance did not exhibit: Callus, Crepitus, Excoriation, Fluctuance, Friable, Induration, Rash, Scarring, Maceration, Atrophie Blanche, Cyanosis, Ecchymosis, Hemosiderin Staining, Mottled, Pallor, Rubor, Erythema. Periwound temperature was noted as No Abnormality. The periwound has tenderness  on palpation. Wound #2 status is Open. Original cause of wound was Gradually Appeared. The wound is located on the Right,Distal Lower Leg. The wound measures 2.2cm length x 1.7cm width x 0.2cm depth; 2.937cm^2 area and 0.587cm^3 volume. The wound is limited to skin breakdown. There is no tunneling or undermining noted. There is a small amount of serosanguineous drainage noted. The wound margin is distinct with the outline attached to the wound base. There is no granulation within the wound bed. There is a large (67- 100%) amount of necrotic tissue within the wound bed including Eschar. The periwound skin appearance exhibited: Localized Edema, Dry/Scaly, Moist. The periwound skin appearance did not exhibit: Callus, Crepitus, Excoriation, Fluctuance, Friable, Induration, Rash, Scarring, Maceration, Atrophie Blanche, Cyanosis, Ecchymosis, Hemosiderin Staining, Mottled, Pallor, Rubor, Erythema. Periwound temperature was noted as No Abnormality. The periwound has tenderness on palpation. Wound #3 status is Open. Original cause of wound was Gradually Appeared. The wound is located on the Left,Lateral Lower Leg. The wound measures 23cm length x 14cm width x 0.5cm depth; 252.898cm^2 area and 126.449cm^3 volume. The wound is limited to skin breakdown. There is no tunneling or undermining noted. There is a medium amount of purulent drainage noted. The wound margin is flat and intact. There is no granulation within the wound bed. There is a large (67-100%) amount of necrotic tissue within the wound bed including Eschar and Adherent Slough. The periwound skin appearance exhibited: Localized Edema, Dry/Scaly, Maceration. The periwound skin appearance did not exhibit: Callus, Crepitus, Excoriation, Fluctuance, Friable, Induration, Rash, Scarring, Moist, Atrophie Blanche, Cyanosis, Ecchymosis, Rosier, [name] ([id number]) Hemosiderin Staining, Mottled, Pallor, Rubor, Erythema. Periwound temperature was noted  as No Abnormality. The periwound has tenderness on palpation. 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 I have put it on record again with her that I have spoken to her surgeon and to her PCP regarding this and it is in the patient's hands whether she wants a debridement and punch biopsies to be done. I have sharply debrided as much as I could in the office today and I have recommended Santyl over the wounds and will put her on Bactrim and ampicillin orally.  He understands the treatment plan and will come back and see me next week. 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 There was a Skin/Subcutaneous Tissue Debridement ([id number]) debridement with total area of 48 sq cm performed by Cornelious Bartolucci, Ignacia Felling., MD. with the following instrument(s): Forceps and Scissors to remove Viable and Non-Viable tissue/material including Fibrin/Slough, Eschar, and Subcutaneous after achieving pain control using Lidocaine 4% Topical Solution. A time out was conducted prior to the start of the procedure. A Moderate amount of bleeding was controlled with Silver Nitrate. The procedure was tolerated well with a pain level of 0 throughout and a pain level of 0 following the procedure. Post Debridement Measurements: 23cm length x 14cm width x 0.5cm depth; 126.449cm^3 volume. Post procedure Diagnosis Wound #3: Same as Pre-Procedure [name], [name] ([id number]) Plan Wound Cleansing: Wound #1 Right,Proximal Lower Leg: Clean wound with Normal Saline. May Shower, gently pat wound dry prior to applying new  dressing. Wound #2 Right,Distal Lower Leg: Clean wound with Normal Saline. May Shower, gently pat wound dry prior to applying new dressing. Wound #3 Left,Lateral Lower Leg: Clean wound with Normal Saline. May Shower, gently pat wound dry prior to applying new dressing. Anesthetic: Wound #1 Right,Proximal Lower Leg: Topical Lidocaine 4% cream applied to wound bed prior to debridement Wound #2 Right,Distal Lower Leg: Topical Lidocaine 4% cream applied to wound bed prior to debridement Wound #3 Left,Lateral Lower Leg: Topical Lidocaine 4% cream applied to wound bed prior to debridement Primary Wound Dressing: Wound #1 Right,Proximal Lower Leg: Santyl Ointment Wound #2 Right,Distal Lower Leg: Santyl Ointment Wound #3 Left,Lateral Lower Leg: Santyl Ointment Secondary Dressing: Wound #1 Right,Proximal Lower Leg: Gauze, ABD and Kerlix/Conform Wound #2 Right,Distal Lower Leg: Gauze, ABD and Kerlix/Conform Wound #3 Left,Lateral Lower Leg: Gauze, ABD and Kerlix/Conform Dressing Change Frequency: Wound #1 Right,Proximal Lower Leg: Change Dressing Monday, Wednesday, Friday Wound #2 Right,Distal Lower Leg: Change Dressing Monday, Wednesday, Friday Wound #3 Left,Lateral Lower Leg: Change Dressing Monday, Wednesday, Friday Follow-up Appointments: Wound #1 Right,Proximal Lower Leg: Return Appointment in 1 week. Wound #2 Right,Distal Lower Leg: Return Appointment in 1 week. Wound #3 Left,Lateral Lower Leg: Return Appointment in 1 week. Edema Control: Wound #1 Right,Proximal Lower Leg: Sherrer, Merle H. ([id number]) Tubigrip Wound #2 Right,Distal Lower Leg: Tubigrip Wound #3 Left,Lateral Lower Leg: Tubigrip Home Health: Wound #1 Right,Proximal Lower Leg: Continue Home Health Visits - [facility] Nurse may visit PRN to address patient s wound care needs. FACE TO FACE ENCOUNTER: MEDICARE and MEDICAID PATIENTS: I certify that this patient is under my care and that I had a  face-to-face encounter that meets the physician face-to-face encounter requirements with this patient on this date. The encounter with the patient was in whole or in part for the following MEDICAL CONDITION: (primary reason for Home Healthcare) MEDICAL NECESSITY: I certify, that based on my findings, NURSING services are a medically necessary home health service. HOME BOUND STATUS: I certify that my clinical findings support that this patient is homebound (i.e., Due to illness or injury, pt requires aid of supportive devices such as crutches, cane, wheelchairs, walkers, the use of special transportation or the assistance of another person to leave their place of residence. There is a normal inability to leave the home and doing so requires considerable and taxing effort. Other absences are for medical reasons / religious services and are infrequent or of short duration when for other reasons). If current dressing causes regression in wound  condition, may D/C ordered dressing product/s and apply Normal Saline Moist Dressing daily until next Wound Healing Center / Other MD appointment. Notify Wound Healing Center of regression in wound condition at [phone]. Please direct any NON-WOUND related issues/requests for orders to patient's Primary Care Physician Wound #2 Right,Distal Lower Leg: Continue Home Health Visits - [facility] Nurse may visit PRN to address patient s wound care needs. FACE TO FACE ENCOUNTER: MEDICARE and MEDICAID PATIENTS: I certify that this patient is under my care and that I had a face-to-face encounter that meets the physician face-to-face encounter requirements with this patient on this date. The encounter with the patient was in whole or in part for the following MEDICAL CONDITION: (primary reason for Home Healthcare) MEDICAL NECESSITY: I certify, that based on my findings, NURSING services are a medically necessary home health service. HOME BOUND STATUS: I  certify that my clinical findings support that this patient is homebound (i.e., Due to illness or injury, pt requires aid of supportive devices such as crutches, cane, wheelchairs, walkers, the use of special transportation or the assistance of another person to leave their place of residence. There is a normal inability to leave the home and doing so requires considerable and taxing effort. Other absences are for medical reasons / religious services and are infrequent or of short duration when for other reasons). If current dressing causes regression in wound condition, may D/C ordered dressing product/s and apply Normal Saline Moist Dressing daily until next Wound Healing Center / Other MD appointment. Notify Wound Healing Center of regression in wound condition at [phone]. Please direct any NON-WOUND related issues/requests for orders to patient's Primary Care Physician Wound #3 Left,Lateral Lower Leg: Continue Home Health Visits - [facility] Nurse may visit PRN to address patient s wound care needs. FACE TO FACE ENCOUNTER: MEDICARE and MEDICAID PATIENTS: I certify that this patient is under my care and that I had a face-to-face encounter that meets the physician face-to-face encounter requirements with this patient on this date. The encounter with the patient was in whole or in part for the following MEDICAL CONDITION: (primary reason for Home Healthcare) MEDICAL NECESSITY: I certify, that based on my findings, NURSING services are a medically necessary home health service. HOME BOUND STATUS: I certify that my clinical findings support that this patient is homebound (i.e., Due to illness or injury, pt requires aid of supportive devices such as crutches, cane, wheelchairs, walkers, the use of special transportation or the assistance of another person to leave their place of residence. There is a [name], [name] ([id number]) normal inability to leave the home and doing so  requires considerable and taxing effort. Other absences are for medical reasons / religious services and are infrequent or of short duration when for other reasons). If current dressing causes regression in wound condition, may D/C ordered dressing product/s and apply Normal Saline Moist Dressing daily until next Wound Healing Center / Other MD appointment. Notify Wound Healing Center of regression in wound condition at [phone]. Please direct any NON-WOUND related issues/requests for orders to patient's Primary Care Physician Medications-please add to medication list.: Wound #1 Right,Proximal Lower Leg: P.O. Antibiotics - doxycycline Santyl Enzymatic Ointment Wound #2 Right,Distal Lower Leg: P.O. Antibiotics - doxycycline Santyl Enzymatic Ointment Wound #3 Left,Lateral Lower Leg: P.O. Antibiotics - doxycycline Santyl Enzymatic Ointment The following medication(s) was prescribed: Santyl topical 250 unit/gram ointment ointment topical starting 03/16/2015 Bactrim DS oral 800 mg-160 mg tablet 1 1 tablet oral bid starting  03/16/2015 ampicillin oral 500 mg capsule 1 1 capsule oral four times daily starting 03/16/2015 There has been a significant reluctance on the patient's part to go to the operating room for debridement of this leg. I have put it on record again with her that I have spoken to her surgeon and to her PCP regarding this and it is in the patient's hands whether she wants a debridement and punch biopsies to be done. I have sharply debrided as much as I could in the office today and I have recommended Santyl over the wounds and will put her on Bactrim and ampicillin orally. He understands the treatment plan and will come back and see me next week. Electronic Signature(s) Signed: 03/17/2015 4:22:06 PM By: Evlyn Kanner MD, FACS Previous Signature: 03/16/2015 4:50:51 PM Version By: Evlyn Kanner MD, FACS Entered By: Evlyn Kanner on 03/17/2015 16:22:06 Shelley, [name]  ([id number]) -------------------------------------------------------------------------------- SuperBill Details Patient Name: Middendorf, Milka H. Date of Service: 03/16/2015 Medical Record Number: [id number] Patient Account Number: [id number] Date of Birth/Sex: [birth year] (59 y.o. Female) Treating RN: Curtis Sites Primary Care Physician: Fidel Levy Other Clinician: Referring Physician: Fidel Levy Treating Physician/Extender: Rudene Re in Treatment: 6 Diagnosis Coding ICD-10 Codes Code Description E11.622 Type 2 diabetes mellitus with other skin ulcer N18.4 Chronic kidney disease, stage 4 (severe) L97.222 Non-pressure chronic ulcer of left calf with fat layer exposed L97.212 Non-pressure chronic ulcer of right calf with fat layer exposed Facility Procedures CPT4 Code: [id number] Description: 11042 - DEB SUBQ TISSUE 20 SQ CM/< ICD-10 Description Diagnosis E11.622 Type 2 diabetes mellitus with other skin ulcer L97.222 Non-pressure chronic ulcer of left calf with fat l N18.4 Chronic kidney disease, stage 4 (severe) L97.212  Non-pressure chronic ulcer of right calf with fat Modifier: ayer exposed layer exposed Quantity: 1 CPT4 Code: [id number] Description: 11045 - DEB SUBQ TISS EA ADDL 20CM ICD-10 Description Diagnosis E11.622 Type 2 diabetes mellitus with other skin ulcer N18.4 Chronic kidney disease, stage 4 (severe) L97.222 Non-pressure chronic ulcer of left calf with fat l L97.212  Non-pressure chronic ulcer of right calf with fat Modifier: ayer exposed layer exposed Quantity: 2 Physician Procedures CPT4 Code: [id number] [place], PAM Description: 11042 - WC PHYS SUBQ TISS 20 SQ CM ICD-10 Description Diagnosis E11.622 Type 2 diabetes mellitus with other skin ulcer L97.222 Non-pressure chronic ulcer of left calf with fat la N18.4 Chronic kidney disease, stage 4 (severe) L97.212  Non-pressure chronic ulcer of right calf with fat l ELA H. ([id number]) Modifier: yer  exposed ayer exposed Quantity: 1 Electronic Signature(s) Signed: 03/16/2015 4:51:08 PM By: Evlyn Kanner MD, FACS Entered By: Evlyn Kanner on 03/16/2015 16:51:08

## 2015-03-17 NOTE — Progress Notes (Signed)
ALGIE, CALES ([id number]) Visit Report for 03/16/2015 Arrival Information Details Patient Name: [name], [name]. Date of Service: 03/16/2015 4:00 PM Medical Record Number: [id number] Patient Account Number: [id number] Date of Birth/Sex: [birth year] (59 y.o. Female) Treating RN: Curtis Sites Primary Care Physician: Fidel Levy Other Clinician: Referring Physician: Fidel Levy Treating Physician/Extender: Rudene Re in Treatment: 6 Visit Information History Since Last Visit Added or deleted any medications: No Patient Arrived: Ambulatory Any new allergies or adverse reactions: No Arrival Time: 15:58 Had a fall or experienced change in No Accompanied By: self activities of daily living that may affect Transfer Assistance: None risk of falls: Patient Identification Verified: Yes Signs or symptoms of abuse/neglect since last No Secondary Verification Process Yes visito Completed: Hospitalized since last visit: No Patient Has Alerts: Yes Pain Present Now: No Patient Alerts: DMII ABI Leando Bilateral Electronic Signature(s) Signed: 03/16/2015 5:38:26 PM By: Curtis Sites Entered By: Curtis Sites on 03/16/2015 15:59:12 Beckstead, Liyla Rexene Edison ([id number]) -------------------------------------------------------------------------------- Encounter Discharge Information Details Patient Name: [name], [name] Date of Service: 03/16/2015 4:00 PM Medical Record Number: [id number] Patient Account Number: [id number] Date of Birth/Sex: 05-02-1956 (59 y.o. Female) Treating RN: Curtis Sites Primary Care Physician: Fidel Levy Other Clinician: Referring Physician: Fidel Levy Treating Physician/Extender: Rudene Re in Treatment: 6 Encounter Discharge Information Items Discharge Pain Level: 0 Discharge Condition: Stable Ambulatory Status: Ambulatory Discharge Destination: Home Transportation: Private Auto Accompanied By: self Schedule  Follow-up Appointment: Yes Medication Reconciliation completed and provided to Patient/Care No Thadd Apuzzo: Provided on Clinical Summary of Care: 03/16/2015 Form Type Recipient Paper Patient PM Electronic Signature(s) Signed: 03/16/2015 4:48:50 PM By: Gwenlyn Perking Entered By: Gwenlyn Perking on 03/16/2015 16:48:50 Lisenby, Ebone H. ([id number]) -------------------------------------------------------------------------------- Lower Extremity Assessment Details Patient Name: [name], [name] Date of Service: 03/16/2015 4:00 PM Medical Record Number: [id number] Patient Account Number: [id number] Date of Birth/Sex: 1955/11/09 (59 y.o. Female) Treating RN: Curtis Sites Primary Care Physician: Fidel Levy Other Clinician: Referring Physician: Fidel Levy Treating Physician/Extender: Rudene Re in Treatment: 6 Edema Assessment Assessed: [Left: No] [Right: No] Edema: [Left: Yes] [Right: Yes] Calf Left: Right: Point of Measurement: 34 cm From Medial Instep 48.2 cm 44.2 cm Ankle Left: Right: Point of Measurement: 10 cm From Medial Instep 26 cm 26 cm Vascular Assessment Pulses: Posterior Tibial Dorsalis Pedis Palpable: [Left:Yes] [Right:Yes] Extremity colors, hair growth, and conditions: Extremity Color: [Left:Mottled] [Right:Mottled] Hair Growth on Extremity: [Left:No] [Right:No] Temperature of Extremity: [Left:Warm] [Right:Warm] Capillary Refill: [Left:< 3 seconds] [Right:< 3 seconds] Toe Nail Assessment Left: Right: Thick: No No Discolored: No No Deformed: No No Improper Length and Hygiene: No No Electronic Signature(s) Signed: 03/16/2015 5:38:26 PM By: Curtis Sites Entered By: Curtis Sites on 03/16/2015 16:09:55 Pardi, Mayah H. ([id number]) -------------------------------------------------------------------------------- Multi Wound Chart Details Patient Name: [name], [name] Date of Service: 03/16/2015 4:00 PM Medical Record Number:  [id number] Patient Account Number: [id number] Date of Birth/Sex: [birth year] (59 y.o. 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] [2:Calciphylaxis] [3:Calciphylaxis] Comorbid History: [1:Hypertension, Type II Diabetes] [2:Hypertension, Type II Diabetes] [3:Hypertension, Type II  Diabetes] Date Acquired: [1:01/09/2015] [2:01/09/2015] [3:01/09/2015] Weeks of Treatment: [1:6] [2:6] [3:6] Wound Status: [1:Open] [2:Open] [3:Open] Measurements L x W x D 2.1x2.2x0.2 [2:2.2x1.7x0.2] [3:23x14x0.5] (cm) Area (cm) : [1:3.629] [2:2.937] [3:252.898] Volume (cm) : [1:0.726] [2:0.587] [3:126.449] % Reduction in Area: [1:-7621.30%] [2:-3024.50%] [3:-2199.90%] % Reduction in Volume: -14420.00% [2:-6422.20%] [3:-11395.40%] Classification: [1:Full Thickness Without Exposed Support Structures] [2:Full Thickness Without Exposed Support Structures] [3:Full Thickness Without Exposed Support Structures] HBO Classification: [1:Grade 1] [2:Grade 1] [3:Grade 2] Exudate Amount: [1:Small] [2:Small] [3:Medium] Exudate Type: [1:Serosanguineous] [2:Serosanguineous] [3:Purulent] Exudate Color: [1:red, brown] [2:red, brown] [3:yellow, brown, green] Foul Odor After [1:No] [2:No] [3:Yes] Cleansing: Odor Anticipated Due to N/A [2:N/A] [3:No] Product Use: Wound Margin: [1:Distinct, outline attached] [2:Distinct, outline  attached Flat and Intact] Granulation Amount: [1:None Present (0%)] [2:None Present (0%)] [3:None Present (0%)] Necrotic Amount: [1:Large (67-100%)] [2:Large (67-100%)] [3:Large (67-100%)] Necrotic Tissue: Eschar Eschar Eschar, Adherent Slough Exposed Structures: Fascia: No Fascia: No Fascia: No Fat: No Fat: No Fat: No Tendon: No Tendon: No Tendon: No Muscle: No Muscle: No Muscle: No Joint: No Joint: No Joint: No Bone: No Bone: No Bone: No Limited to Skin Limited to Skin Limited to Skin Breakdown Breakdown Breakdown Epithelialization: None None Small (1-33%) Periwound Skin Texture: Edema: Yes Edema: Yes Edema: Yes Excoriation: No Excoriation: No Excoriation: No Induration: No Induration: No Induration: No Callus: No Callus: No Callus: No Crepitus: No Crepitus: No Crepitus: No Fluctuance: No Fluctuance: No Fluctuance: No Friable: No Friable: No Friable: No Rash: No Rash: No Rash: No Scarring: No Scarring: No Scarring: No Periwound Skin Moist: Yes Moist: Yes Maceration: Yes Moisture: Dry/Scaly: Yes Dry/Scaly: Yes Dry/Scaly: Yes Maceration: No Maceration: No Moist: No Periwound Skin Color: Atrophie Blanche: No Atrophie Blanche: No Atrophie Blanche: No Cyanosis: No Cyanosis: No Cyanosis: No Ecchymosis: No Ecchymosis: No Ecchymosis: No Erythema: No Erythema: No Erythema: No Hemosiderin Staining: No Hemosiderin Staining: No Hemosiderin Staining: No Mottled: No Mottled: No Mottled: No Pallor: No Pallor: No Pallor: No Rubor: No Rubor: No Rubor: No Temperature: No Abnormality No Abnormality No Abnormality Tenderness on Yes Yes Yes Palpation: Wound Preparation: Ulcer Cleansing: Ulcer Cleansing: Ulcer Cleansing: Rinsed/Irrigated with Rinsed/Irrigated with Rinsed/Irrigated with Saline Saline Saline Topical Anesthetic Topical Anesthetic Topical Anesthetic Applied: Other: lidocaine Applied: Other: lidocaine Applied: Other: lidocaine 4% 4%  4% Treatment Notes Electronic Signature(s) Signed: 03/16/2015 5:38:26 PM By: Curtis Sites Entered By: Curtis Sites on 03/16/2015 16:19:43 Hail, [name] ([id number]) -------------------------------------------------------------------------------- Multi-Disciplinary Care Plan Details Patient Name: [name], [name] Date of Service: 03/16/2015 4:00 PM Medical Record Number: [id number] Patient Account Number: [id number] Date of Birth/Sex: [birth year] (59 y.o. Female) Treating RN: Curtis Sites Primary Care Physician: Fidel Levy Other Clinician: Referring Physician: Fidel Levy Treating Physician/Extender: Rudene Re in Treatment: 6 Active Inactive Orientation to the Wound Care Program Nursing Diagnoses: Knowledge deficit related to the wound healing center program Goals: Patient/caregiver will verbalize understanding of the Wound Healing Center Program Date Initiated: 01/30/2015 Goal Status: Active Interventions: Provide education on orientation to the wound center Notes: Pain, Acute or Chronic Nursing Diagnoses: Pain, acute or chronic: actual or potential Goals: Patient will verbalize adequate pain control and receive pain control interventions during procedures as needed Date Initiated: 01/30/2015 Goal Status: Active Patient/caregiver will verbalize adequate pain control between visits Date Initiated: 01/30/2015 Goal Status: Active Interventions: Assess comfort goal upon admission Encourage patient to take pain medications as prescribed Notes: Venous Leg Ulcer Nursing Diagnoses: Mattioli, Saidah H. ([id number]) Potential for venous Insuffiency (use before diagnosis confirmed) Goals: Non-invasive venous  studies are completed as ordered Date Initiated: 01/30/2015 Goal Status: Active Interventions: Assess peripheral edema status every visit. Notes: Wound/Skin Impairment Nursing Diagnoses: Impaired tissue integrity Goals: Ulcer/skin  breakdown will have a volume reduction of 30% by week 4 Date Initiated: 01/30/2015 Goal Status: Active Ulcer/skin breakdown will have a volume reduction of 50% by week 8 Date Initiated: 01/30/2015 Goal Status: Active Ulcer/skin breakdown will have a volume reduction of 80% by week 12 Date Initiated: 01/30/2015 Goal Status: Active Ulcer/skin breakdown will heal within 14 weeks Date Initiated: 01/30/2015 Goal Status: Active Interventions: Assess ulceration(s) every visit Notes: Electronic Signature(s) Signed: 03/16/2015 5:38:26 PM By: Curtis Sites Entered By: Curtis Sites on 03/16/2015 16:19:35 Cotterman, [name] ([id number]) -------------------------------------------------------------------------------- Patient/Caregiver Education Details Patient Name: [name], [name] Date of Service: 03/16/2015 4:00 PM Medical Record Number: [id number] Patient Account Number: [id number] Date of Birth/Gender: 11-23-1955 (59 y.o. Female) Treating RN: Curtis Sites Primary Care Physician: Fidel Levy Other Clinician: Referring Physician: Fidel Levy Treating Physician/Extender: Rudene Re in Treatment: 6 Education Assessment Education Provided To: Patient Education Topics Provided Wound/Skin Impairment: Handouts: Other: new wound care as ordered Methods: Demonstration, Explain/Verbal Responses: State content correctly Electronic Signature(s) Signed: 03/16/2015 5:38:26 PM By: Curtis Sites Entered By: Curtis Sites on 03/16/2015 16:47:16 Mullen, Brittanny H. ([id number]) -------------------------------------------------------------------------------- Wound Assessment Details Patient Name: [name], [name] Date of Service: 03/16/2015 4:00 PM Medical Record Number: [id number] Patient Account Number: [id number] Date of Birth/Sex: 1955/10/07 (59 y.o. Female) Treating RN: Curtis Sites Primary Care Physician: Fidel Levy Other Clinician: Referring Physician:  Fidel Levy Treating Physician/Extender: Rudene Re in Treatment: 6 Wound Status Wound Number: 1 Primary Etiology: Calciphylaxis Wound Location: Right Lower Leg - Proximal Wound Status: Open Wounding Event: Gradually Appeared Comorbid History: Hypertension, Type II Diabetes Date Acquired: 01/09/2015 Weeks Of Treatment: 6 Clustered Wound: No Photos Photo Uploaded By: Curtis Sites on 03/16/2015 17:06:32 Wound Measurements Length: (cm) 2.1 Width: (cm) 2.2 Depth: (cm) 0.2 Area: (cm) 3.629 Volume: (cm) 0.726 % Reduction in Area: -7621.3% % Reduction in Volume: -14420% Epithelialization: None Tunneling: No Undermining: No Wound Description Full Thickness Without Exposed Foul Odor A Classification: Support Structures Diabetic Severity Grade 1 (Wagner): Wound Margin: Distinct, outline attached Exudate Amount: Small Exudate Type: Serosanguineous Exudate Color: red, brown fter Cleansing: No Wound Bed Granulation Amount: None Present (0%) Exposed Structure Necrotic Amount: Large (67-100%) Fascia Exposed: No Pipe, Gracieann H. ([id number]) Necrotic Quality: Eschar Fat Layer Exposed: No Tendon Exposed: No Muscle Exposed: No Joint Exposed: No Bone Exposed: No Limited to Skin Breakdown Periwound Skin Texture Texture Color No Abnormalities Noted: No No Abnormalities Noted: No Callus: No Atrophie Blanche: No Crepitus: No Cyanosis: No Excoriation: No Ecchymosis: No Fluctuance: No Erythema: No Friable: No Hemosiderin Staining: No Induration: No Mottled: No Localized Edema: Yes Pallor: No Rash: No Rubor: No Scarring: No Temperature / Pain Moisture Temperature: No Abnormality No Abnormalities Noted: No Tenderness on Palpation: Yes Dry / Scaly: Yes Maceration: No Moist: Yes Wound Preparation Ulcer Cleansing: Rinsed/Irrigated with Saline Topical Anesthetic Applied: Other: lidocaine 4%, Treatment Notes Wound #1 (Right, Proximal Lower  Leg) 1. Cleansed with: Clean wound with Normal Saline 2. Anesthetic Topical Lidocaine 4% cream to wound bed prior to debridement 4. Dressing Applied: Santyl Ointment 5. Secondary Dressing Applied Guaze, ABD and kerlix/Conform 6. Footwear/Offloading device applied Tubigrip 7. Secured with Secretary/administrator) Signed: 03/16/2015 5:38:26 PM By: Vilma Prader, Kenedy Rexene Edison ([id number]) Entered By: Curtis Sites on 03/16/2015 16:13:55 Diekman, Maleeah Rexene Edison ([id number]) -------------------------------------------------------------------------------- Wound Assessment Details  Patient Name: [name], [name]. Date of Service: 03/16/2015 4:00 PM Medical Record Number: [id number] Patient Account Number: [id number] Date of Birth/Sex: 1955-11-03 (59 y.o. Female) Treating RN: Curtis Sites Primary Care Physician: Fidel Levy Other Clinician: Referring Physician: Fidel Levy Treating Physician/Extender: Rudene Re in Treatment: 6 Wound Status Wound Number: 2 Primary Etiology: Calciphylaxis Wound Location: Right Lower Leg - Distal Wound Status: Open Wounding Event: Gradually Appeared Comorbid History: Hypertension, Type II Diabetes Date Acquired: 01/09/2015 Weeks Of Treatment: 6 Clustered Wound: No Photos Photo Uploaded By: Curtis Sites on 03/16/2015 17:06:32 Wound Measurements Length: (cm) 2.2 Width: (cm) 1.7 Depth: (cm) 0.2 Area: (cm) 2.937 Volume: (cm) 0.587 % Reduction in Area: -3024.5% % Reduction in Volume: -6422.2% Epithelialization: None Tunneling: No Undermining: No Wound Description Full Thickness Without Exposed Foul Odor A Classification: Support Structures Diabetic Severity Grade 1 (Wagner): Wound Margin: Distinct, outline attached Exudate Amount: Small Exudate Type: Serosanguineous Exudate Color: red, brown fter Cleansing: No Wound Bed Granulation Amount: None Present (0%) Exposed Structure Necrotic Amount: Large  (67-100%) Fascia Exposed: No Schuyler, Avarose H. ([id number]) Necrotic Quality: Eschar Fat Layer Exposed: No Tendon Exposed: No Muscle Exposed: No Joint Exposed: No Bone Exposed: No Limited to Skin Breakdown Periwound Skin Texture Texture Color No Abnormalities Noted: No No Abnormalities Noted: No Callus: No Atrophie Blanche: No Crepitus: No Cyanosis: No Excoriation: No Ecchymosis: No Fluctuance: No Erythema: No Friable: No Hemosiderin Staining: No Induration: No Mottled: No Localized Edema: Yes Pallor: No Rash: No Rubor: No Scarring: No Temperature / Pain Moisture Temperature: No Abnormality No Abnormalities Noted: No Tenderness on Palpation: Yes Dry / Scaly: Yes Maceration: No Moist: Yes Wound Preparation Ulcer Cleansing: Rinsed/Irrigated with Saline Topical Anesthetic Applied: Other: lidocaine 4%, Treatment Notes Wound #2 (Right, Distal Lower Leg) 1. Cleansed with: Clean wound with Normal Saline 2. Anesthetic Topical Lidocaine 4% cream to wound bed prior to debridement 4. Dressing Applied: Santyl Ointment 5. Secondary Dressing Applied Guaze, ABD and kerlix/Conform 6. Footwear/Offloading device applied Tubigrip 7. Secured with Secretary/administrator) Signed: 03/16/2015 5:38:26 PM By: Vilma Prader, [name] ([id number]) Entered By: Curtis Sites on 03/16/2015 16:14:08 Silversmith, Theta Rexene Edison ([id number]) -------------------------------------------------------------------------------- Wound Assessment Details Patient Name: [name], [name] Date of Service: 03/16/2015 4:00 PM Medical Record Number: [id number] Patient Account Number: [id number] Date of Birth/Sex: 1955/12/07 (59 y.o. Female) Treating RN: Curtis Sites Primary Care Physician: Fidel Levy Other Clinician: Referring Physician: Fidel Levy Treating Physician/Extender: Rudene Re in Treatment: 6 Wound Status Wound Number: 3 Primary Etiology:  Calciphylaxis Wound Location: Left Lower Leg - Lateral Wound Status: Open Wounding Event: Gradually Appeared Comorbid History: Hypertension, Type II Diabetes Date Acquired: 01/09/2015 Weeks Of Treatment: 6 Clustered Wound: No Photos Wound Measurements Length: (cm) 23 Width: (cm) 14 Depth: (cm) 0.5 Area: (cm) 252.898 Volume: (cm) 126.449 % Reduction in Area: -2199.9% % Reduction in Volume: -11395.4% Epithelialization: Small (1-33%) Tunneling: No Undermining: No Wound Description Full Thickness Without Classification: Exposed Support Structures Diabetic Severity Grade 2 (Wagner): Wound Margin: Flat and Intact Exudate Amount: Medium Exudate Type: Purulent Exudate Color: yellow, brown, green Foul Odor After Cleansing: Yes Due to Product Use: No Wound Bed Granulation Amount: None Present (0%) Exposed Structure Necrotic Amount: Large (67-100%) Fascia Exposed: No [name], [name] ([id number]) Necrotic Quality: Eschar, Adherent Slough Fat Layer Exposed: No Tendon Exposed: No Muscle Exposed: No Joint Exposed: No Bone Exposed: No Limited to Skin Breakdown Periwound Skin Texture Texture Color No Abnormalities Noted: No No Abnormalities Noted: No Callus: No Atrophie  Blanche: No Crepitus: No Cyanosis: No Excoriation: No Ecchymosis: No Fluctuance: No Erythema: No Friable: No Hemosiderin Staining: No Induration: No Mottled: No Localized Edema: Yes Pallor: No Rash: No Rubor: No Scarring: No Temperature / Pain Moisture Temperature: No Abnormality No Abnormalities Noted: No Tenderness on Palpation: Yes Dry / Scaly: Yes Maceration: Yes Moist: No Wound Preparation Ulcer Cleansing: Rinsed/Irrigated with Saline Topical Anesthetic Applied: Other: lidocaine 4%, Treatment Notes Wound #3 (Left, Lateral Lower Leg) 1. Cleansed with: Clean wound with Normal Saline 2. Anesthetic Topical Lidocaine 4% cream to wound bed prior to debridement 4. Dressing  Applied: Santyl Ointment 5. Secondary Dressing Applied Guaze, ABD and kerlix/Conform 6. Footwear/Offloading device applied Tubigrip 7. Secured with Secretary/administrator) Signed: 03/16/2015 5:07:10 PM By: Vilma Prader, [name] ([id number]) Entered By: Curtis Sites on 03/16/2015 17:07:10 Landing, [name] ([id number]) -------------------------------------------------------------------------------- Vitals Details Patient Name: Maese, [name] Date of Service: 03/16/2015 4:00 PM Medical Record Number: [id number] Patient Account Number: [id number] Date of Birth/Sex: 07-22-1955 (59 y.o. Female) Treating RN: Curtis Sites Primary Care Physician: Fidel Levy Other Clinician: Referring Physician: Fidel Levy Treating Physician/Extender: Rudene Re in Treatment: 6 Vital Signs Time Taken: 15:59 Temperature (F): 98.7 Height (in): 68 Pulse (bpm): 85 Weight (lbs): 294 Respiratory Rate (breaths/min): 18 Body Mass Index (BMI): 44.7 Blood Pressure (mmHg): 113/57 Reference Range: 80 - 120 mg / dl Electronic Signature(s) Signed: 03/16/2015 5:38:26 PM By: Curtis Sites Entered By: Curtis Sites on 03/16/2015 16:00:09

## 2015-03-23 ENCOUNTER — Encounter: Payer: BC Managed Care – PPO | Admitting: Surgery

## 2015-03-23 DIAGNOSIS — L97222 Non-pressure chronic ulcer of left calf with fat layer exposed: Secondary | ICD-10-CM | POA: Diagnosis not present

## 2015-03-24 NOTE — Progress Notes (Signed)
[name] ([id number]) Visit Report for 03/23/2015 Arrival Information Details Patient Name: [name], [name]. Date of Service: 03/23/2015 9:15 AM Medical Record Number: [id number] Patient Account Number: [id number] Date of Birth/Sex: [birth year] (58 y.o. Female) Treating RN: Huel Coventry Primary Care Physician: Fidel Levy Other Clinician: Referring Physician: Fidel Levy Treating Physician/Extender: Rudene Re in Treatment: 7 Visit Information History Since Last Visit Added or deleted any medications: No Patient Arrived: Ambulatory Any new allergies or adverse reactions: No Arrival Time: 09:18 Had a fall or experienced change in No Accompanied By: self activities of daily living that may affect Transfer Assistance: None risk of falls: Patient Identification Verified: Yes Signs or symptoms of abuse/neglect since last No Secondary Verification Process Yes visito Completed: Hospitalized since last visit: No Patient Has Alerts: Yes Has Dressing in Place as Prescribed: Yes Patient Alerts: DMII Pain Present Now: No ABI [place] Bilateral Electronic Signature(s) Signed: 03/23/2015 5:03:43 PM By: Elliot Gurney, RN, BSN, Kim RN, BSN Entered By: Elliot Gurney, RN, BSN, Kim on 03/23/2015 09:18:27 [name], [name] ([id number]) -------------------------------------------------------------------------------- Clinic Level of Care Assessment Details Patient Name: [name], [name] Date of Service: 03/23/2015 9:15 AM Medical Record Number: [id number] Patient Account Number: [id number] Date of Birth/Sex: 05-11-1956 (58 y.o. Female) Treating RN: Huel Coventry Primary Care Physician: Fidel Levy Other Clinician: Referring Physician: Fidel Levy Treating Physician/Extender: Rudene Re in Treatment: 7 Clinic Level of Care Assessment Items TOOL 4 Quantity Score  - Use when only an EandM is performed on FOLLOW-UP visit 0 ASSESSMENTS - Nursing Assessment /  Reassessment  - Reassessment of Co-morbidities (includes updates in patient status) 0 X - Reassessment of Adherence to Treatment Plan 1 5 ASSESSMENTS - Wound and Skin Assessment / Reassessment  - Simple Wound Assessment / Reassessment - one wound 0 X - Complex Wound Assessment / Reassessment - multiple wounds 1 5  - Dermatologic / Skin Assessment (not related to wound area) 0 ASSESSMENTS - Focused Assessment  - Circumferential Edema Measurements - multi extremities 0  - Nutritional Assessment / Counseling / Intervention 0  - Lower Extremity Assessment (monofilament, tuning fork, pulses) 0  - Peripheral Arterial Disease Assessment (using hand held doppler) 0 ASSESSMENTS - Ostomy and/or Continence Assessment and Care  - Incontinence Assessment and Management 0  - Ostomy Care Assessment and Management (repouching, etc.) 0 PROCESS - Coordination of Care X - Simple Patient / Family Education for ongoing care 1 15  - Complex (extensive) Patient / Family Education for ongoing care 0 X - Staff obtains Chiropractor, Records, Test Results / Process Orders 1 10  - Staff telephones HHA, Nursing Homes / Clarify orders / etc 0  - Routine Transfer to another Facility (non-emergent condition) 0 Mcenroe, Illeana H. ([id number])  - Routine Hospital Admission (non-emergent condition) 0  - New Admissions / Manufacturing engineer / Ordering NPWT, Apligraf, etc. 0  - Emergency Hospital Admission (emergent condition) 0 X - Simple Discharge Coordination 1 10  - Complex (extensive) Discharge Coordination 0 PROCESS - Special Needs  - Pediatric / Minor Patient Management 0  - Isolation Patient Management 0  - Hearing / Language / Visual special needs 0  - Assessment of Community assistance (transportation, D/C planning, etc.) 0  - Additional assistance / Altered mentation 0  - Support Surface(s) Assessment (bed, cushion, seat, etc.) 0 INTERVENTIONS - Wound Cleansing /  Measurement X - Simple Wound Cleansing - one wound 1 5  - Complex Wound Cleansing - multiple wounds 0 X - Wound Imaging (  photographs - any number of wounds) 1 5 []  - Wound Tracing (instead of photographs) 0 X - Simple Wound Measurement - one wound 1 5 []  - Complex Wound Measurement - multiple wounds 0 INTERVENTIONS - Wound Dressings []  - Small Wound Dressing one or multiple wounds 0 X - Medium Wound Dressing one or multiple wounds 1 15 []  - Large Wound Dressing one or multiple wounds 0 []  - Application of Medications - topical 0 []  - Application of Medications - injection 0 INTERVENTIONS - Miscellaneous []  - External ear exam 0 Baltes, Adrienne H. ([id number]) []  - Specimen Collection (cultures, biopsies, blood, body fluids, etc.) 0 []  - Specimen(s) / Culture(s) sent or taken to Lab for analysis 0 []  - Patient Transfer (multiple staff / Michiel [name] Lift / Similar devices) 0 []  - Simple Staple / Suture removal (25 or less) 0 []  - Complex Staple / Suture removal (26 or more) 0 []  - Hypo / Hyperglycemic Management (close monitor of Blood Glucose) 0 []  - Ankle / Brachial Index (ABI) - do not check if billed separately 0 X - Vital Signs 1 5 Has the patient been seen at the hospital within the last three years: Yes Total Score: 80 Level Of Care: New/Established - Level 3 Electronic Signature(s) Signed: 03/23/2015 5:03:43 PM By: Elliot Gurney, RN, BSN, Kim RN, BSN Entered By: Elliot Gurney, RN, BSN, Kim on 03/23/2015 09:52:58 [name] ([id number]) -------------------------------------------------------------------------------- Encounter Discharge Information Details Patient Name: [name], [name] Date of Service: 03/23/2015 9:15 AM Medical Record Number: [id number] Patient Account Number: [id number] Date of Birth/Sex: [birth year] (58 y.o. Female) Treating RN: [name] Primary Care Physician: Fidel Levy Other Clinician: Referring Physician: Fidel Levy Treating  Physician/Extender: Rudene Re in Treatment: 7 Encounter Discharge Information Items Discharge Pain Level: 0 Discharge Condition: Stable Ambulatory Status: Ambulatory Discharge Destination: Home Transportation: Other Accompanied By: self Schedule Follow-up Appointment: Yes Medication Reconciliation completed and provided to Patient/Care Yes Clancey Welton: Provided on Clinical Summary of Care: 03/23/2015 Form Type Recipient Paper Patient PM Electronic Signature(s) Signed: 03/23/2015 5:03:43 PM By: Elliot Gurney RN, BSN, Kim RN, BSN Previous Signature: 03/23/2015 9:52:11 AM Version By: Gwenlyn Perking Entered By: Elliot Gurney RN, BSN, Kim on 03/23/2015 09:54:08 Laker, [name] ([id number]) -------------------------------------------------------------------------------- Lower Extremity Assessment Details Patient Name: [name], [name] Date of Service: 03/23/2015 9:15 AM Medical Record Number: [id number] Patient Account Number: [id number] Date of Birth/Sex: 09-25-1955 (58 y.o. Female) Treating RN: Huel Coventry Primary Care Physician: Fidel Levy Other Clinician: Referring Physician: Fidel Levy Treating Physician/Extender: Rudene Re in Treatment: 7 Edema Assessment Assessed: [Left: No] [Right: No] E[Left: dema] [Right: :] Calf Left: Right: Point of Measurement: 34 cm From Medial Instep 48 cm 45 cm Ankle Left: Right: Point of Measurement: 10 cm From Medial Instep 25.6 cm 25.9 cm Vascular Assessment Pulses: Posterior Tibial Dorsalis Pedis Palpable: [Left:Yes] [Right:Yes] Extremity colors, hair growth, and conditions: Extremity Color: [Left:Mottled] [Right:Mottled] Hair Growth on Extremity: [Left:No] [Right:No] Temperature of Extremity: [Left:Warm] [Right:Warm] Capillary Refill: [Left:< 3 seconds] [Right:< 3 seconds] Toe Nail Assessment Left: Right: Thick: No No Discolored: No No Deformed: No No Improper Length and Hygiene: No No Electronic  Signature(s) Signed: 03/23/2015 5:03:43 PM By: Elliot Gurney, RN, BSN, Kim RN, BSN Entered By: Elliot Gurney, RN, BSN, Kim on 03/23/2015 09:22:27 Cardin, [name] ([id number]) -------------------------------------------------------------------------------- Multi Wound Chart Details Patient Name: [name], [name] Date of Service: 03/23/2015 9:15 AM Medical Record Number: [id number] Patient Account Number: [id number] Date of Birth/Sex: 08-18-1955 (58 y.o. 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] [2:Calciphylaxis] [3:Calciphylaxis] Comorbid History: [1:Hypertension, Type II Diabetes] [2:Hypertension, Type II Diabetes] [3:Hypertension, Type II Diabetes] Date Acquired: [1:01/09/2015] [2:01/09/2015] [3:01/09/2015] Weeks of Treatment: [1:7] [2:7] [3:7] Wound Status: [1:Open] [2:Open] [3:Open] Measurements L x W x D 2.2x2x0.2 [2:2x1.6x0.2] [3:21.5x13x0.5] (cm) Area (cm) : [1:3.456] [2:2.513] [3:219.519] Volume (cm) : [1:0.691] [2:0.503] [3:109.759] % Reduction in Area: [1:-7253.20%] [2:-2573.40%] [3:-1896.40%] % Reduction in Volume: -13720.00% [2:-5488.90%] [3:-9878.10%] Classification: [1:Full Thickness Without Exposed Support Structures] [2:Full Thickness Without Exposed Support Structures] [3:Full Thickness Without Exposed Support Structures] HBO Classification: [1:Grade 1] [2:Grade 1] [3:Grade  2] Exudate Amount: [1:Small] [2:Small] [3:Medium] Exudate Type: [1:Serosanguineous] [2:Serosanguineous] [3:Purulent] Exudate Color: [1:red, brown] [2:red, brown] [3:yellow, brown, green] Foul Odor After [1:No] [2:No] [3:Yes] Cleansing: Odor Anticipated Due to N/A [2:N/A] [3:No] Product Use: Wound Margin: [1:Distinct, outline attached] [2:Distinct, outline attached Flat and Intact] Granulation Amount: [1:None Present (0%)] [2:None Present (0%)] [3:None Present (0%)] Necrotic Amount: [1:Large (67-100%)] [2:Large (67-100%)] [3:Large (67-100%)] Necrotic Tissue: Eschar, Adherent Slough Eschar, Adherent [name], Adherent Slough Exposed Structures: Fascia: No Fascia: No Fascia: No Fat: No Fat: No Fat: No Tendon: No Tendon: No Tendon: No Muscle: No Muscle: No Muscle: No Joint: No Joint: No Joint: No Bone: No Bone: No Bone: No Limited to Skin Limited to Skin Limited to Skin Breakdown Breakdown Breakdown Epithelialization: None None Small (1-33%) Periwound Skin Texture: Edema: Yes Edema: Yes Edema: Yes Excoriation: No Excoriation: No Excoriation: No Induration: No Induration: No Induration: No Callus: No Callus: No Callus: No Crepitus: No Crepitus: No Crepitus: No Fluctuance: No Fluctuance: No Fluctuance: No Friable: No Friable: No Friable: No Rash: No Rash: No Rash: No Scarring: No Scarring: No Scarring: No Periwound Skin Moist: Yes Moist: Yes Maceration: Yes Moisture: Dry/Scaly: Yes Dry/Scaly: Yes Dry/Scaly: Yes Maceration: No Maceration: No Moist: No Periwound Skin Color: Atrophie Blanche: No Atrophie Blanche: No Atrophie Blanche: No Cyanosis: No Cyanosis: No Cyanosis: No Ecchymosis: No Ecchymosis: No Ecchymosis: No Erythema: No Erythema: No Erythema: No Hemosiderin Staining: No Hemosiderin Staining: No Hemosiderin Staining: No Mottled: No Mottled: No Mottled: No Pallor: No Pallor: No Pallor: No Rubor: No Rubor: No Rubor:  No Temperature: No Abnormality No Abnormality No Abnormality Tenderness on Yes Yes Yes Palpation: Wound Preparation: Ulcer Cleansing: Ulcer Cleansing: Ulcer Cleansing: Rinsed/Irrigated with Rinsed/Irrigated with Rinsed/Irrigated with Saline Saline Saline Topical Anesthetic Topical Anesthetic Topical Anesthetic Applied: Other: lidocaine Applied: Other: lidocaine Applied: Other: lidocaine 4% 4% 4% Treatment Notes Electronic Signature(s) Signed: 03/23/2015 5:03:43 PM By: Elliot Gurney, RN, BSN, Kim RN, BSN Entered By: Elliot Gurney, RN, BSN, Kim on 03/23/2015 09:27:42 Everard, [name] ([id number]) -------------------------------------------------------------------------------- Multi-Disciplinary Care Plan Details Patient Name: [name], [name] Date of Service: 03/23/2015 9:15 AM Medical Record Number: [id number] Patient Account Number: [id number] Date of Birth/Sex: [birth year] (58 y.o. Female) Treating RN: Huel Coventry Primary Care Physician: Fidel Levy Other Clinician: Referring Physician: Fidel Levy Treating Physician/Extender: Rudene Re in Treatment: 7 Active Inactive Orientation to the Wound Care Program Nursing Diagnoses: Knowledge deficit related to the wound healing center program Goals: Patient/caregiver will verbalize understanding of the Wound Healing  Center Program Date Initiated: 01/30/2015 Goal Status: Active Interventions: Provide education on orientation to the wound center Notes: Pain, Acute or Chronic Nursing Diagnoses: Pain, acute or chronic: actual or potential Goals: Patient will verbalize adequate pain control and receive pain control interventions during procedures as needed Date Initiated: 01/30/2015 Goal Status: Active Patient/caregiver will verbalize adequate pain control between visits Date Initiated: 01/30/2015 Goal Status: Active Interventions: Assess comfort goal upon admission Encourage patient to take pain medications as  prescribed Notes: Venous Leg Ulcer Nursing Diagnoses: Derise, Imya H. ([id number]) Potential for venous Insuffiency (use before diagnosis confirmed) Goals: Non-invasive venous studies are completed as ordered Date Initiated: 01/30/2015 Goal Status: Active Interventions: Assess peripheral edema status every visit. Notes: Wound/Skin Impairment Nursing Diagnoses: Impaired tissue integrity Goals: Ulcer/skin breakdown will have a volume reduction of 30% by week 4 Date Initiated: 01/30/2015 Goal Status: Active Ulcer/skin breakdown will have a volume reduction of 50% by week 8 Date Initiated: 01/30/2015 Goal Status: Active Ulcer/skin breakdown will have a volume reduction of 80% by week 12 Date Initiated: 01/30/2015 Goal Status: Active Ulcer/skin breakdown will heal within 14 weeks Date Initiated: 01/30/2015 Goal Status: Active Interventions: Assess ulceration(s) every visit Notes: Electronic Signature(s) Signed: 03/23/2015 5:03:43 PM By: Elliot Gurney, RN, BSN, Kim RN, BSN Entered By: Elliot Gurney, RN, BSN, Kim on 03/23/2015 09:27:35 Manas, [name] ([id number]) -------------------------------------------------------------------------------- Pain Assessment Details Patient Name: [name], [name] Date of Service: 03/23/2015 9:15 AM Medical Record Number: [id number] Patient Account Number: [id number] Date of Birth/Sex: [birth year] (58 y.o. Female) Treating RN: Huel Coventry Primary Care Physician: Fidel Levy Other Clinician: Referring Physician: Fidel Levy Treating Physician/Extender: Rudene Re in Treatment: 7 Active Problems Location of Pain Severity and Description of Pain Patient Has Paino No Site Locations Pain Management and Medication Current Pain Management: Electronic Signature(s) Signed: 03/23/2015 5:03:43 PM By: Elliot Gurney, RN, BSN, Kim RN, BSN Entered By: Elliot Gurney, RN, BSN, Kim on 03/23/2015 09:18:33 Laneve, [name]  ([id number]) -------------------------------------------------------------------------------- Patient/Caregiver Education Details Patient Name: Crandle, Glorie H. Date of Service: 03/23/2015 9:15 AM Medical Record Number: [id number] Patient Account Number: [id number] Date of Birth/Gender: 1956-03-24 (59 y.o. Female) Treating RN: Huel Coventry Primary Care Physician: Fidel Levy Other Clinician: Referring Physician: Fidel Levy Treating Physician/Extender: Rudene Re in Treatment: 7 Education Assessment Education Provided To: Patient Education Topics Provided Wound/Skin Impairment: Handouts: Caring for Your Ulcer, Skin Care Do's and Dont's Methods: Demonstration Responses: State content correctly Electronic Signature(s) Signed: 03/23/2015 5:03:43 PM By: Elliot Gurney, RN, BSN, Kim RN, BSN Entered By: Elliot Gurney, RN, BSN, Kim on 03/23/2015 09:54:27 Korson, [name] ([id number]) -------------------------------------------------------------------------------- Wound Assessment Details Patient Name: [name], [name] Date of Service: 03/23/2015 9:15 AM Medical Record Number: [id number] Patient Account Number: [id number] Date of Birth/Sex: [birth year] (58 y.o. Female) Treating RN: Huel Coventry Primary Care Physician: Fidel Levy Other Clinician: Referring Physician: Fidel Levy Treating Physician/Extender: Rudene Re in Treatment: 7 Wound Status Wound Number: 1 Primary Etiology: Calciphylaxis Wound Location: Right Lower Leg - Proximal Wound Status: Open Wounding Event: Gradually Appeared Comorbid History: Hypertension, Type II Diabetes Date Acquired: 01/09/2015 Weeks Of Treatment: 7 Clustered Wound: No Photos Photo Uploaded By: Elliot Gurney, RN, BSN, Kim on 03/23/2015 10:32:40 Wound Measurements Length: (cm) 2.2 Width: (cm) 2 Depth: (cm) 0.2 Area: (cm) 3.456 Volume: (cm) 0.691 % Reduction in Area: -7253.2% % Reduction in Volume:  -13720% Epithelialization: None Wound Description Full Thickness Without Exposed Foul Odor A Classification: Support Structures Diabetic Severity Grade 1 (Wagner): Wound Margin: Distinct, outline attached Exudate Amount:  Small Exudate Type: Serosanguineous Exudate Color: red, brown fter Cleansing: No Wound Bed Granulation Amount: None Present (0%) Exposed Structure Necrotic Amount: Large (67-100%) Fascia Exposed: No Kunert, Avon H. ([id number]) Necrotic Quality: Eschar, Adherent Slough Fat Layer Exposed: No Tendon Exposed: No Muscle Exposed: No Joint Exposed: No Bone Exposed: No Limited to Skin Breakdown Periwound Skin Texture Texture Color No Abnormalities Noted: No No Abnormalities Noted: No Callus: No Atrophie Blanche: No Crepitus: No Cyanosis: No Excoriation: No Ecchymosis: No Fluctuance: No Erythema: No Friable: No Hemosiderin Staining: No Induration: No Mottled: No Localized Edema: Yes Pallor: No Rash: No Rubor: No Scarring: No Temperature / Pain Moisture Temperature: No Abnormality No Abnormalities Noted: No Tenderness on Palpation: Yes Dry / Scaly: Yes Maceration: No Moist: Yes Wound Preparation Ulcer Cleansing: Rinsed/Irrigated with Saline Topical Anesthetic Applied: Other: lidocaine 4%, Treatment Notes Wound #1 (Right, Proximal Lower Leg) 1. Cleansed with: Clean wound with Normal Saline 2. Anesthetic Topical Lidocaine 4% cream to wound bed prior to debridement 4. Dressing Applied: Santyl Ointment 5. Secondary Dressing Applied ABD and Kerlix/Conform 7. Secured with International aid/development worker) Signed: 03/23/2015 5:03:43 PM By: Elliot Gurney, RN, BSN, Kim RN, BSN Entered By: Elliot Gurney, RN, BSN, Kim on 03/23/2015 40:98:11 [name] ([id number]) Bozman, [name] ([id number]) -------------------------------------------------------------------------------- Wound Assessment Details Patient Name: [name], [name] Date of Service:  03/23/2015 9:15 AM Medical Record Number: [id number] Patient Account Number: [id number] Date of Birth/Sex: 1956-02-11 (58 y.o. Female) Treating RN: Huel Coventry Primary Care Physician: Fidel Levy Other Clinician: Referring Physician: Fidel Levy Treating Physician/Extender: Rudene Re in Treatment: 7 Wound Status Wound Number: 2 Primary Etiology: Calciphylaxis Wound Location: Right Lower Leg - Distal Wound Status: Open Wounding Event: Gradually Appeared Comorbid History: Hypertension, Type II Diabetes Date Acquired: 01/09/2015 Weeks Of Treatment: 7 Clustered Wound: No Photos Photo Uploaded By: Elliot Gurney, RN, BSN, Kim on 03/23/2015 10:32:41 Wound Measurements Length: (cm) 2 Width: (cm) 1.6 Depth: (cm) 0.2 Area: (cm) 2.513 Volume: (cm) 0.503 % Reduction in Area: -2573.4% % Reduction in Volume: -5488.9% Epithelialization: None Wound Description Full Thickness Without Exposed Foul Odor A Classification: Support Structures Diabetic Severity Grade 1 (Wagner): Wound Margin: Distinct, outline attached Exudate Amount: Small Exudate Type: Serosanguineous Exudate Color: red, brown fter Cleansing: No Wound Bed Granulation Amount: None Present (0%) Exposed Structure Necrotic Amount: Large (67-100%) Fascia Exposed: No Gavin, Orion H. ([id number]) Necrotic Quality: Eschar, Adherent Slough Fat Layer Exposed: No Tendon Exposed: No Muscle Exposed: No Joint Exposed: No Bone Exposed: No Limited to Skin Breakdown Periwound Skin Texture Texture Color No Abnormalities Noted: No No Abnormalities Noted: No Callus: No Atrophie Blanche: No Crepitus: No Cyanosis: No Excoriation: No Ecchymosis: No Fluctuance: No Erythema: No Friable: No Hemosiderin Staining: No Induration: No Mottled: No Localized Edema: Yes Pallor: No Rash: No Rubor: No Scarring: No Temperature / Pain Moisture Temperature: No Abnormality No Abnormalities Noted: No Tenderness on  Palpation: Yes Dry / Scaly: Yes Maceration: No Moist: Yes Wound Preparation Ulcer Cleansing: Rinsed/Irrigated with Saline Topical Anesthetic Applied: Other: lidocaine 4%, Treatment Notes Wound #2 (Right, Distal Lower Leg) 1. Cleansed with: Clean wound with Normal Saline 2. Anesthetic Topical Lidocaine 4% cream to wound bed prior to debridement 4. Dressing Applied: Santyl Ointment 5. Secondary Dressing Applied ABD and Kerlix/Conform 7. Secured with International aid/development worker) Signed: 03/23/2015 5:03:43 PM By: Elliot Gurney, RN, BSN, Kim RN, BSN Entered By: Elliot Gurney, RN, BSN, Kim on 03/23/2015 09:26:45 Gerstenberger, [name] ([id number]) Lavalley, [name] ([id number]) -------------------------------------------------------------------------------- Wound Assessment Details Patient Name: [name], [name]  Date of Service: 03/23/2015 9:15 AM Medical Record Number: [id number] Patient Account Number: [id number] Date of Birth/Sex: 1955/09/28 (58 y.o. Female) Treating RN: Huel Coventry Primary Care Physician: Fidel Levy Other Clinician: Referring Physician: Fidel Levy Treating Physician/Extender: Rudene Re in Treatment: 7 Wound Status Wound Number: 3 Primary Etiology: Calciphylaxis Wound Location: Left Lower Leg - Lateral Wound Status: Open Wounding Event: Gradually Appeared Comorbid History: Hypertension, Type II Diabetes Date Acquired: 01/09/2015 Weeks Of Treatment: 7 Clustered Wound: No Photos Photo Uploaded By: Elliot Gurney, RN, BSN, Kim on 03/23/2015 10:33:04 Wound Measurements Length: (cm) 21.5 % Reduction in Width: (cm) 13 % Reduction in Depth: (cm) 0.5 Epithelializati Area: (cm) 219.519 Volume: (cm) 109.759 Area: -1896.4% Volume: -9878.1% on: Small (1-33%) Wound Description Full Thickness Without Classification: Exposed Support Structures Diabetic Severity Grade 2 (Wagner): Wound Margin: Flat and Intact [name], [name] ([id number]) Foul Odor  After Cleansing: Yes Due to Product Use: No Exudate Amount: Medium Exudate Type: Purulent Exudate Color: yellow, brown, green Wound Bed Granulation Amount: None Present (0%) Exposed Structure Necrotic Amount: Large (67-100%) Fascia Exposed: No Necrotic Quality: Eschar, Adherent Slough Fat Layer Exposed: No Tendon Exposed: No Muscle Exposed: No Joint Exposed: No Bone Exposed: No Limited to Skin Breakdown Periwound Skin Texture Texture Color No Abnormalities Noted: No No Abnormalities Noted: No Callus: No Atrophie Blanche: No Crepitus: No Cyanosis: No Excoriation: No Ecchymosis: No Fluctuance: No Erythema: No Friable: No Hemosiderin Staining: No Induration: No Mottled: No Localized Edema: Yes Pallor: No Rash: No Rubor: No Scarring: No Temperature / Pain Moisture Temperature: No Abnormality No Abnormalities Noted: No Tenderness on Palpation: Yes Dry / Scaly: Yes Maceration: Yes Moist: No Wound Preparation Ulcer Cleansing: Rinsed/Irrigated with Saline Topical Anesthetic Applied: Other: lidocaine 4%, Treatment Notes Wound #3 (Left, Lateral Lower Leg) 1. Cleansed with: Clean wound with Normal Saline 2. Anesthetic Topical Lidocaine 4% cream to wound bed prior to debridement 4. Dressing Applied: Santyl Ointment 5. Secondary Dressing Applied ABD and Kerlix/Conform [name], [name] ([id number]) 7. Secured with International aid/development worker) Signed: 03/23/2015 5:03:43 PM By: Elliot Gurney, RN, BSN, Kim RN, BSN Entered By: Elliot Gurney, RN, BSN, Kim on 03/23/2015 09:27:14 Hankey, [name] ([id number]) -------------------------------------------------------------------------------- Vitals Details Patient Name: Ventress, Sheyann H. Date of Service: 03/23/2015 9:15 AM Medical Record Number: [id number] Patient Account Number: [id number] Date of Birth/Sex: 04/02/1956 (58 y.o. Female) Treating RN: Huel Coventry Primary Care Physician: Fidel Levy Other Clinician: Referring  Physician: Fidel Levy Treating Physician/Extender: Rudene Re in Treatment: 7 Vital Signs Time Taken: 09:18 Temperature (F): 97.9 Height (in): 68 Pulse (bpm): 93 Weight (lbs): 294 Respiratory Rate (breaths/min): 20 Body Mass Index (BMI): 44.7 Blood Pressure (mmHg): 98/51 Reference Range: 80 - 120 mg / dl Electronic Signature(s) Signed: 03/23/2015 5:03:43 PM By: Elliot Gurney, RN, BSN, Kim RN, BSN Entered By: Elliot Gurney, RN, BSN, Kim on 03/23/2015 09:18:56

## 2015-03-24 NOTE — Progress Notes (Addendum)
TAKEYA, MARQUIS ([id number]) Visit Report for 03/23/2015 Chief Complaint Document Details Patient Name: [name], [name]. Date of Service: 03/23/2015 9:15 AM Medical Record Number: [id number] Patient Account Number: [id number] Date of Birth/Sex: 04-03-1956 (59 y.o. Female) Treating RN: Curtis Sites Primary Care Physician: Fidel Levy Other Clinician: Referring Physician: Fidel Levy Treating Physician/Extender: Rudene Re in Treatment: 7 Information Obtained from: Patient Chief Complaint Patient presents to the wound care center for a consult due non healing wound. This 59 year old patient has had ulcerated areas on the left lower extremity more than the right lower extremity to very painful for the last 3 weeks. Electronic Signature(s) Signed: 03/23/2015 9:39:37 AM By: Evlyn Kanner MD, FACS Entered By: Evlyn Kanner on 03/23/2015 09:39:37 [name], [name] ([id number]) -------------------------------------------------------------------------------- HPI Details Patient Name: [name], [name] Date of Service: 03/23/2015 9:15 AM Medical Record Number: [id number] Patient Account Number: [id number] Date of Birth/Sex: 27-May-1956 (59 y.o. Female) Treating RN: Curtis Sites Primary Care Physician: Fidel Levy Other Clinician: Referring Physician: Fidel Levy Treating Physician/Extender: Rudene Re in Treatment: 7 History of Present Illness Location: Painful ulcerated areas left lower extremity more than right lower extremity Quality: Patient reports experiencing a sharp pain to affected area(s). Severity: Patient states wound are getting worse. Duration: Patient has had the wound for < 4 weeks prior to presenting for treatment Timing: Pain in wound is Intermittent (comes and goes Context: The wound appeared gradually over time Modifying Factors: Other treatment(s) tried include: she has had Keflex for about 10 days. Associated Signs and  Symptoms: Patient reports having difficulty standing for long periods. HPI Description: 59 year old patient recently seen by her PCP Dr. Venora Maples who saw her on 01/25/2015 4 ulcers both lower extremities which have been present for about 3 weeks. She took a ten-day course of Keflex and continues to have leg pain and swelling. Past medical history significant for chronic kidney disease, hypertension and diabetes mellitus without complications. She has never been a smoker. Mostly recent CBC was within normal limits, BMP showed that her glucose was 136 and a beard was 45 and a creatinine is 4.19. The patient has had chronic renal failure and had a left AV fistula placed by Dr. Wyn Quaker in May and she is getting ready for hemodialysis soon. She has not been put on any specific medications for this problem. Addendum: tried to get Dr. 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 all questions answered. 02/07/2015 -- the patient started hemodialysis yesterday but was not successful and is going to be seeing Dr. Wyn Quaker regain. She also has a vascular workup for early August. No further investigations or referrals have been made after my conversation with her PCPs office last week. I have asked the patient to call her PCPs office and discuss further investigations and referrals. 02/14/2015 -- she has had 2 sessions of hemodialysis via the right IJ dialysis catheter. She has not seen her PCP or Dr. Wyn Quaker yet. Part of a vascular workup was done today and the next is sometime later in the month. Unfortunately she does not agree to any surgical intervention at this stage and I have not been able to refer her to a surgeon for debridement in the OR. 02/21/2015 -- she has not decided to proceed with surgery yet and is going to see her  PCP tomorrow for more  discussions. I have offered to talk to her specialists at any time and she does understand that. 03/07/15 -- she has come to see as back after 2 weeks and says that she can only come once in 2 weeks because of social issues. This time around she agrees to see Dr. dew and discuss surgical intervention and I will put in a call to him. Addendum: I got to speak to Dr. Festus Barren who kindly agreed to take her up to the OR for a debridement and punch biopsies. He will also work on her peripheral vascular workup to see if any angioplasty will help. 03/16/2015 cultures taken on 823 showed that she had a heavy growth of Stenotrophomonas maltophilia and enterococcus species. These were sensitive to levofloxacin, Septra, ampicillin. The patient is already [facility], [name] ([id number]) on doxycycline. she was taken to the OR on 03/13/2015 for a angioplasty of her left arm AV fistula, which treated the cephalic vein near the subclavian vein confluence. The patient did not have any debridement of her left lower extremity and no biopsies were taken during the procedure done by Dr. Wyn Quaker. 03/22/2015 -- she has a pending appointment with Dr. Wyn Quaker in the next week and I have asked her to address going to the operating room and getting an opinion regarding this debridement and possible vascular workup. Electronic Signature(s) Signed: 03/23/2015 9:40:34 AM By: Evlyn Kanner MD, FACS Entered By: Evlyn Kanner on 03/23/2015 09:40:34 [name], [name] ([id number]) -------------------------------------------------------------------------------- Physical Exam Details Patient Name: Agudelo, [name] Date of Service: 03/23/2015 9:15 AM Medical Record Number: [id number] Patient Account Number: [id number] Date of Birth/Sex: 20-Feb-1956 (59 y.o. Female) Treating RN: Curtis Sites Primary Care Physician: Fidel Levy Other Clinician: Referring Physician: Fidel Levy Treating Physician/Extender: Rudene Re in Treatment: 7 Constitutional . Pulse regular. Respirations normal and unlabored. Afebrile. . Eyes Nonicteric. Reactive to light. Ears, Nose, Mouth, and Throat Lips, teeth, and gums WNL.Marland Kitchen Moist mucosa without lesions . Neck supple and nontender. No palpable supraclavicular or cervical adenopathy. Normal sized without goiter. Respiratory WNL. No retractions.. Cardiovascular Pedal Pulses WNL. No clubbing, cyanosis or edema. Chest Breasts symmetical and no nipple discharge.. Breast tissue WNL, no masses, lumps, or tenderness.. Lymphatic No adneopathy. No adenopathy. No adenopathy. Musculoskeletal Adexa without tenderness or enlargement.. Digits and nails w/o clubbing, cyanosis, infection, petechiae, ischemia, or inflammatory conditions.. Integumentary (Hair, Skin) No suspicious lesions. No crepitus or fluctuance. No peri-wound warmth or erythema. No masses.Marland Kitchen Psychiatric Judgement and insight Intact.. No evidence of depression, anxiety, or agitation.. Notes there continues to be significant amount of eschar and slough over the ulcerated wounds more on the left lower extremity rather than the right. Electronic Signature(s) Signed: 03/23/2015 9:41:06 AM By: Evlyn Kanner MD, FACS Entered By: Evlyn Kanner on 03/23/2015 09:41:06 Tome, [name] ([id number]) -------------------------------------------------------------------------------- Physician Orders Details Patient Name: [name], [name] Date of Service: 03/23/2015 9:15 AM Medical Record Number: [id number] Patient Account Number: [id number] Date of Birth/Sex: 12/05/1955 (59 y.o. Female) Treating RN: Huel Coventry Primary Care Physician: Fidel Levy Other Clinician: Referring Physician: Fidel Levy Treating Physician/Extender: Rudene Re in Treatment: 7 Verbal / Phone Orders: Yes Clinician: Huel Coventry Read Back and Verified: Yes Diagnosis Coding Wound Cleansing Wound #1 Right,Proximal Lower  Leg o Clean wound with Normal Saline. o May Shower, gently pat wound dry prior to applying new dressing. Wound #2 Right,Distal Lower Leg o Clean wound with Normal Saline. o May Shower, gently pat wound dry  prior to applying new dressing. Wound #3 Left,Lateral Lower Leg o Clean wound with Normal Saline. o May Shower, gently pat wound dry prior to applying new dressing. Anesthetic Wound #1 Right,Proximal Lower Leg o Topical Lidocaine 4% cream applied to wound bed prior to debridement Wound #2 Right,Distal Lower Leg o Topical Lidocaine 4% cream applied to wound bed prior to debridement Wound #3 Left,Lateral Lower Leg o Topical Lidocaine 4% cream applied to wound bed prior to debridement Primary Wound Dressing Wound #1 Right,Proximal Lower Leg o Santyl Ointment Wound #2 Right,Distal Lower Leg o Santyl Ointment Wound #3 Left,Lateral Lower Leg o Santyl Ointment Secondary Dressing Wound #1 Right,Proximal Lower Leg o ABD and Kerlix/Conform Brett, Irva H. ([id number]) Wound #3 Left,Lateral Lower Leg o ABD and Kerlix/Conform Dressing Change Frequency Wound #1 Right,Proximal Lower Leg o Change Dressing Monday, Wednesday, Friday Wound #2 Right,Distal Lower Leg o Change Dressing Monday, Wednesday, Friday Wound #3 Left,Lateral Lower Leg o Change Dressing Monday, Wednesday, Friday Follow-up Appointments Wound #1 Right,Proximal Lower Leg o Return Appointment in 1 week. Wound #2 Right,Distal Lower Leg o Return Appointment in 1 week. Wound #3 Left,Lateral Lower Leg o Return Appointment in 1 week. Edema Control Wound #1 Right,Proximal Lower Leg o Tubigrip Wound #2 Right,Distal Lower Leg o Tubigrip Wound #3 Left,Lateral Lower Leg o Tubigrip Home Health Wound #1 Right,Proximal Lower Leg o Continue Home Health Visits - [facility] o Home Health Nurse may visit PRN to address patientos wound care needs. o FACE TO FACE ENCOUNTER:  MEDICARE and MEDICAID PATIENTS: I certify that this patient is under my care and that I had a face-to-face encounter that meets the physician face-to-face encounter requirements with this patient on this date. The encounter with the patient was in whole or in part for the following MEDICAL CONDITION: (primary reason for Home Healthcare) MEDICAL NECESSITY: I certify, that based on my findings, NURSING services are a medically necessary home health service. HOME BOUND STATUS: I certify that my clinical findings support that this patient is homebound (i.e., Due to illness or injury, pt requires aid of supportive devices such as crutches, cane, wheelchairs, walkers, the use of special transportation or the assistance of another person to leave their place of residence. There is a normal inability to leave the home and doing so requires considerable and taxing effort. Other [name], [name] ([id number]) absences are for medical reasons / religious services and are infrequent or of short duration when for other reasons). o If current dressing causes regression in wound condition, may D/C ordered dressing product/s and apply Normal Saline Moist Dressing daily until next Wound Healing Center / Other MD appointment. Notify Wound Healing Center of regression in wound condition at [phone]. o Please direct any NON-WOUND related issues/requests for orders to patient's Primary Care Physician Wound #2 Right,Distal Lower Leg o Continue Home Health Visits - [facility] o Home Health Nurse may visit PRN to address patientos wound care needs. o FACE TO FACE ENCOUNTER: MEDICARE and MEDICAID PATIENTS: I certify that this patient is under my care and that I had a face-to-face encounter that meets the physician face-to-face encounter requirements with this patient on this date. The encounter with the patient was in whole or in part for the following MEDICAL CONDITION: (primary reason for Home  Healthcare) MEDICAL NECESSITY: I certify, that based on my findings, NURSING services are a medically necessary home health service. HOME BOUND STATUS: I certify that my clinical findings support that this patient is homebound (i.e., Due to illness or injury,  pt requires aid of supportive devices such as crutches, cane, wheelchairs, walkers, the use of special transportation or the assistance of another person to leave their place of residence. There is a normal inability to leave the home and doing so requires considerable and taxing effort. Other absences are for medical reasons / religious services and are infrequent or of short duration when for other reasons). o If current dressing causes regression in wound condition, may D/C ordered dressing product/s and apply Normal Saline Moist Dressing daily until next Wound Healing Center / Other MD appointment. Notify Wound Healing Center of regression in wound condition at [phone]. o Please direct any NON-WOUND related issues/requests for orders to patient's Primary Care Physician Wound #3 Left,Lateral Lower Leg o Continue Home Health Visits - [facility] o Home Health Nurse may visit PRN to address patientos wound care needs. o FACE TO FACE ENCOUNTER: MEDICARE and MEDICAID PATIENTS: I certify that this patient is under my care and that I had a face-to-face encounter that meets the physician face-to-face encounter requirements with this patient on this date. The encounter with the patient was in whole or in part for the following MEDICAL CONDITION: (primary reason for Home Healthcare) MEDICAL NECESSITY: I certify, that based on my findings, NURSING services are a medically necessary home health service. HOME BOUND STATUS: I certify that my clinical findings support that this patient is homebound (i.e., Due to illness or injury, pt requires aid of supportive devices such as crutches, cane, wheelchairs, walkers, the use of  special transportation or the assistance of another person to leave their place of residence. There is a normal inability to leave the home and doing so requires considerable and taxing effort. Other absences are for medical reasons / religious services and are infrequent or of short duration when for other reasons). o If current dressing causes regression in wound condition, may D/C ordered dressing product/s and apply Normal Saline Moist Dressing daily until next Wound Healing Center / Other MD appointment. Notify Wound Healing Center of regression in wound condition at [phone]. o Please direct any NON-WOUND related issues/requests for orders to patient's Primary Care Physician [name], [name] ([id number]) Medications-please add to medication list. Wound #1 Right,Proximal Lower Leg o P.O. Antibiotics - Ampicillin o Santyl Enzymatic Ointment Wound #2 Right,Distal Lower Leg o P.O. Antibiotics - Ampicillin o Santyl Enzymatic Ointment Wound #3 Left,Lateral Lower Leg o P.O. Antibiotics - Ampicillin o Santyl Enzymatic Ointment Electronic Signature(s) Signed: 03/23/2015 4:23:40 PM By: Evlyn Kanner MD, FACS Signed: 03/23/2015 5:03:43 PM By: Elliot Gurney RN, BSN, Kim RN, BSN Entered By: Elliot Gurney, RN, BSN, Kim on 03/23/2015 09:40:43 Rattan, [name] ([id number]) -------------------------------------------------------------------------------- Problem List Details Patient Name: [name], [name] Date of Service: 03/23/2015 9:15 AM Medical Record Number: [id number] Patient Account Number: [id number] Date of Birth/Sex: [birth year] (59 y.o. 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 Date of  Service: 03/23/2015 9:15 AM Medical Record Number: [id number] Patient Account Number: [id number] Date of Birth/Sex: 03/26/1956 (59 y.o. Female) Treating RN: Curtis Sites Primary Care Physician: Fidel Levy Other Clinician: Referring Physician: Fidel Levy Treating Physician/Extender: Rudene Re in Treatment: 7 Subjective Chief Complaint Information obtained from Patient Patient presents to the wound care center for a consult due non healing wound. This 59 year old patient has had ulcerated areas on the left lower extremity more than the right lower extremity to very painful for the last 3 weeks. History of Present Illness (HPI) The following HPI elements were documented for the patient's wound: Location: Painful ulcerated areas left lower extremity more than right lower extremity Quality: Patient reports experiencing a sharp pain to affected area(s). Severity: Patient states wound are getting worse. Duration: Patient has had the wound for < 4 weeks prior to presenting for treatment Timing: Pain in wound is Intermittent (comes and goes Context: The wound appeared gradually over time Modifying Factors: Other treatment(s) tried include: she has had Keflex for about 10 days. Associated Signs and Symptoms: Patient reports having difficulty standing for long periods. 59 year old patient recently  seen by her PCP Dr. Venora Maples who saw her on 01/25/2015 4 ulcers both lower extremities which have been present for about 3 weeks. She took a ten-day course of Keflex and continues to have leg pain and swelling. Past medical history significant for chronic kidney disease, hypertension and diabetes mellitus without complications. She has never been a smoker. Mostly recent CBC was within normal limits, BMP showed that her glucose was 136 and a beard was 45 and a creatinine is 4.19. The patient has had chronic renal failure and had a left AV fistula placed by Dr. Wyn Quaker in May and she is getting ready for hemodialysis soon. She has not been put on any specific medications for this problem. Addendum: tried to get Dr. 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 all questions answered. 02/07/2015 -- the patient started hemodialysis yesterday but was not successful and is going to be seeing Dr. Wyn Quaker regain. She also has a vascular workup for early August. No further investigations or referrals have been made after my conversation with her PCPs office last week. I have asked the patient to call her PCPs office and discuss further investigations and referrals. 02/14/2015 -- she has had 2 sessions of hemodialysis via the right IJ dialysis catheter. She has not seen her PCP or Dr. Wyn Quaker yet. Part of a vascular workup was done today and the next is sometime later in the month. Unfortunately she does not agree to any surgical intervention at this stage and I have not been able to refer her to a surgeon for debridement in the OR. 02/21/2015 -- she has not decided to proceed with surgery yet and is going to see her PCP tomorrow for [facility], Arminda H. ([id number]) more discussions. I have offered to talk to her specialists at any time and she does  understand that. 03/07/15 -- she has come to see as back after 2 weeks and says that she can only come once in 2 weeks because of social issues. This time around she agrees to see Dr. dew and discuss surgical intervention and I will put in a call to him. Addendum: I got to speak to Dr. Festus Barren who  kindly agreed to take her up to the OR for a debridement and punch biopsies. He will also work on her peripheral vascular workup to see if any angioplasty will help. 03/16/2015 cultures taken on 823 showed that she had a heavy growth of Stenotrophomonas maltophilia and enterococcus species. These were sensitive to levofloxacin, Septra, ampicillin. The patient is already on doxycycline. she was taken to the OR on 03/13/2015 for a angioplasty of her left arm AV fistula, which treated the cephalic vein near the subclavian vein confluence. The patient did not have any debridement of her left lower extremity and no biopsies were taken during the procedure done by Dr. Wyn Quaker. 03/22/2015 -- she has a pending appointment with Dr. Wyn Quaker in the next week and I have asked her to address going to the operating room and getting an opinion regarding this debridement and possible vascular workup. Objective Constitutional Pulse regular. Respirations normal and unlabored. Afebrile. Vitals Time Taken: 9:18 AM, Height: 68 in, Weight: 294 lbs, BMI: 44.7, Temperature: 97.9 F, Pulse: 93 bpm, Respiratory Rate: 20 breaths/min, Blood Pressure: 98/51 mmHg. Eyes Nonicteric. Reactive to light. Ears, Nose, Mouth, and Throat Lips, teeth, and gums WNL.Marland Kitchen Moist mucosa without lesions . Neck supple and nontender. No palpable supraclavicular or cervical adenopathy. Normal sized without goiter. Respiratory WNL. No retractions.. Cardiovascular Pedal Pulses WNL. No clubbing, cyanosis or edema. Chest Breasts symmetical and no nipple discharge.. Breast tissue WNL, no masses, lumps, or tenderness.Marland Kitchen [name], [name]  ([id number]) Lymphatic No adneopathy. No adenopathy. No adenopathy. Musculoskeletal Adexa without tenderness or enlargement.. Digits and nails w/o clubbing, cyanosis, infection, petechiae, ischemia, or inflammatory conditions.Marland Kitchen Psychiatric Judgement and insight Intact.. No evidence of depression, anxiety, or agitation.. General Notes: there continues to be significant amount of eschar and slough over the ulcerated wounds more on the left lower extremity rather than the right. Integumentary (Hair, Skin) No suspicious lesions. No crepitus or fluctuance. No peri-wound warmth or erythema. No masses.. Wound #1 status is Open. Original cause of wound was Gradually Appeared. The wound is located on the Right,Proximal Lower Leg. The wound measures 2.2cm length x 2cm width x 0.2cm depth; 3.456cm^2 area and 0.691cm^3 volume. The wound is limited to skin breakdown. There is a small amount of serosanguineous drainage noted. The wound margin is distinct with the outline attached to the wound base. There is no granulation within the wound bed. There is a large (67-100%) amount of necrotic tissue within the wound bed including Eschar and Adherent Slough. The periwound skin appearance exhibited: Localized Edema, Dry/Scaly, Moist. The periwound skin appearance did not exhibit: Callus, Crepitus, Excoriation, Fluctuance, Friable, Induration, Rash, Scarring, Maceration, Atrophie Blanche, Cyanosis, Ecchymosis, Hemosiderin Staining, Mottled, Pallor, Rubor, Erythema. Periwound temperature was noted as No Abnormality. The periwound has tenderness on palpation. Wound #2 status is Open. Original cause of wound was Gradually Appeared. The wound is located on the Right,Distal Lower Leg. The wound measures 2cm length x 1.6cm width x 0.2cm depth; 2.513cm^2 area and 0.503cm^3 volume. The wound is limited to skin breakdown. There is a small amount of serosanguineous drainage noted. The wound margin is distinct with the  outline attached to the wound base. There is no granulation within the wound bed. There is a large (67-100%) amount of necrotic tissue within the wound bed including Eschar and Adherent Slough. The periwound skin appearance exhibited: Localized Edema, Dry/Scaly, Moist. The periwound skin appearance did not exhibit: Callus, Crepitus, Excoriation, Fluctuance, Friable, Induration, Rash, Scarring, Maceration, Atrophie Blanche, Cyanosis, Ecchymosis, Hemosiderin Staining, Mottled, Pallor, Rubor,  Erythema. Periwound temperature was noted as No Abnormality. The periwound has tenderness on palpation. Wound #3 status is Open. Original cause of wound was Gradually Appeared. The wound is located on the Left,Lateral Lower Leg. The wound measures 21.5cm length x 13cm width x 0.5cm depth; 219.519cm^2 area and 109.759cm^3 volume. The wound is limited to skin breakdown. There is a medium amount of purulent drainage noted. The wound margin is flat and intact. There is no granulation within the wound bed. There is a large (67-100%) amount of necrotic tissue within the wound bed including Eschar and Adherent Slough. The periwound skin appearance exhibited: Localized Edema, Dry/Scaly, Maceration. The periwound skin appearance did not exhibit: Callus, Crepitus, Excoriation, Fluctuance, Friable, Induration, Rash, Scarring, Moist, Atrophie Blanche, Cyanosis, Ecchymosis, Hemosiderin Staining, Mottled, Pallor, Rubor, Erythema. Periwound temperature was noted as No Abnormality. The periwound has tenderness on palpation. 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 I  have again spent some time discussing with her the rationale of getting debilitated in the OR with punch biopsies to be taken to establish a better diagnosis of her problems. She has a pending appointment with Dr. Wyn Quaker in the next week and I have asked her to address going to the operating room and getting an opinion regarding this debridement and possible vascular workup. Plan Wound Cleansing: Wound #1 Right,Proximal Lower Leg: Clean wound with Normal Saline. May Shower, gently pat wound dry prior to applying new dressing. Wound #2 Right,Distal Lower Leg: Clean wound with Normal Saline. May Shower, gently pat wound dry prior to applying new dressing. Wound #3 Left,Lateral Lower Leg: Clean wound with Normal Saline. May Shower, gently pat wound dry prior to applying new dressing. Anesthetic: Wound #1 Right,Proximal Lower Leg: Topical Lidocaine 4% cream applied to wound bed prior to debridement Wound #2 Right,Distal Lower Leg: Topical Lidocaine 4% cream applied to wound bed prior to debridement Wound #3 Left,Lateral Lower Leg: Topical Lidocaine 4% cream applied to wound bed prior to debridement Primary Wound Dressing: Wound #1 Right,Proximal Lower Leg: Santyl Ointment Wound #2 Right,Distal Lower Leg: Santyl Ointment [name], [name] ([id number]) Wound #3 Left,Lateral Lower Leg: Santyl Ointment Secondary Dressing: Wound #1 Right,Proximal Lower Leg: ABD and Kerlix/Conform Wound #3 Left,Lateral Lower Leg: ABD and Kerlix/Conform Dressing Change Frequency: Wound #1 Right,Proximal Lower Leg: Change Dressing Monday, Wednesday, Friday Wound #2 Right,Distal Lower Leg: Change Dressing Monday, Wednesday, Friday Wound #3 Left,Lateral Lower Leg: Change Dressing Monday, Wednesday, Friday Follow-up Appointments: Wound #1 Right,Proximal Lower Leg: Return Appointment in 1 week. Wound #2 Right,Distal Lower Leg: Return Appointment in 1 week. Wound #3 Left,Lateral Lower Leg: Return  Appointment in 1 week. Edema Control: Wound #1 Right,Proximal Lower Leg: Tubigrip Wound #2 Right,Distal Lower Leg: Tubigrip Wound #3 Left,Lateral Lower Leg: Tubigrip Home Health: Wound #1 Right,Proximal Lower Leg: Continue Home Health Visits - [facility] Nurse may visit PRN to address patient s wound care needs. FACE TO FACE ENCOUNTER: MEDICARE and MEDICAID PATIENTS: I certify that this patient is under my care and that I had a face-to-face encounter that meets the physician face-to-face encounter requirements with this patient on this date. The encounter with the patient was in whole or in part  for the following MEDICAL CONDITION: (primary reason for Home Healthcare) MEDICAL NECESSITY: I certify, that based on my findings, NURSING services are a medically necessary home health service. HOME BOUND STATUS: I certify that my clinical findings support that this patient is homebound (i.e., Due to illness or injury, pt requires aid of supportive devices such as crutches, cane, wheelchairs, walkers, the use of special transportation or the assistance of another person to leave their place of residence. There is a normal inability to leave the home and doing so requires considerable and taxing effort. Other absences are for medical reasons / religious services and are infrequent or of short duration when for other reasons). If current dressing causes regression in wound condition, may D/C ordered dressing product/s and apply Normal Saline Moist Dressing daily until next Wound Healing Center / Other MD appointment. Notify Wound Healing Center of regression in wound condition at [phone]. Please direct any NON-WOUND related issues/requests for orders to patient's Primary Care Physician Wound #2 Right,Distal Lower Leg: Continue Home Health Visits - [facility] Nurse may visit PRN to address patient s wound care needs. FACE TO FACE ENCOUNTER: MEDICARE and MEDICAID PATIENTS: I  certify that this patient is under my care and that I had a face-to-face encounter that meets the physician face-to-face encounter [name], HAUPT. ([id number]) requirements with this patient on this date. The encounter with the patient was in whole or in part for the following MEDICAL CONDITION: (primary reason for Home Healthcare) MEDICAL NECESSITY: I certify, that based on my findings, NURSING services are a medically necessary home health service. HOME BOUND STATUS: I certify that my clinical findings support that this patient is homebound (i.e., Due to illness or injury, pt requires aid of supportive devices such as crutches, cane, wheelchairs, walkers, the use of special transportation or the assistance of another person to leave their place of residence. There is a normal inability to leave the home and doing so requires considerable and taxing effort. Other absences are for medical reasons / religious services and are infrequent or of short duration when for other reasons). If current dressing causes regression in wound condition, may D/C ordered dressing product/s and apply Normal Saline Moist Dressing daily until next Wound Healing Center / Other MD appointment. Notify Wound Healing Center of regression in wound condition at [phone]. Please direct any NON-WOUND related issues/requests for orders to patient's Primary Care Physician Wound #3 Left,Lateral Lower Leg: Continue Home Health Visits - [facility] Nurse may visit PRN to address patient s wound care needs. FACE TO FACE ENCOUNTER: MEDICARE and MEDICAID PATIENTS: I certify that this patient is under my care and that I had a face-to-face encounter that meets the physician face-to-face encounter requirements with this patient on this date. The encounter with the patient was in whole or in part for the following MEDICAL CONDITION: (primary reason for Home Healthcare) MEDICAL NECESSITY: I certify, that based on my  findings, NURSING services are a medically necessary home health service. HOME BOUND STATUS: I certify that my clinical findings support that this patient is homebound (i.e., Due to illness or injury, pt requires aid of supportive devices such as crutches, cane, wheelchairs, walkers, the use of special transportation or the assistance of another person to leave their place of residence. There is a normal inability to leave the home and doing so requires considerable and taxing effort. Other absences are for medical reasons / religious services and are infrequent or of short duration when for other  reasons). If current dressing causes regression in wound condition, may D/C ordered dressing product/s and apply Normal Saline Moist Dressing daily until next Wound Healing Center / Other MD appointment. Notify Wound Healing Center of regression in wound condition at [phone]. Please direct any NON-WOUND related issues/requests for orders to patient's Primary Care Physician Medications-please add to medication list.: Wound #1 Right,Proximal Lower Leg: P.O. Antibiotics - Ampicillin Santyl Enzymatic Ointment Wound #2 Right,Distal Lower Leg: P.O. Antibiotics - Ampicillin Santyl Enzymatic Ointment Wound #3 Left,Lateral Lower Leg: P.O. Antibiotics - Ampicillin Santyl Enzymatic Ointment We will continue with Santyl locally and she will complete a course of antibiotics. I have again spent some time discussing with her the rationale of getting debilitated in the OR with punch biopsies to be taken to establish a better diagnosis of her problems. GIANNAMARIE, PAULUS ([id number]) She has a pending appointment with Dr. Wyn Quaker in the next week and I have asked her to address going to the operating room and getting an opinion regarding this debridement and possible vascular workup. Electronic Signature(s) Signed: 03/23/2015 9:42:14 AM By: Evlyn Kanner MD, FACS Entered By: Evlyn Kanner on 03/23/2015  09:42:14 Herd, [name] ([id number]) -------------------------------------------------------------------------------- SuperBill Details Patient Name: Mednick, Alaena H. Date of Service: 03/23/2015 Medical Record Number: [id number] Patient Account Number: [id number] Date of Birth/Sex: 1955/10/28 (59 y.o. Female) Treating RN: Curtis Sites Primary Care Physician: Fidel Levy Other Clinician: Referring Physician: Fidel Levy Treating Physician/Extender: Rudene Re in Treatment: 7 Diagnosis Coding ICD-10 Codes Code Description E11.622 Type 2 diabetes mellitus with other skin ulcer N18.4 Chronic kidney disease, stage 4 (severe) L97.222 Non-pressure chronic ulcer of left calf with fat layer exposed L97.212 Non-pressure chronic ulcer of right calf with fat layer exposed Facility Procedures CPT4 Code: 29528413 Description: 99213 - WOUND CARE VISIT-LEV 3 EST PT Modifier: Quantity: 1 Physician Procedures CPT4 Code: 2440102 Description: 99213 - WC PHYS LEVEL 3 - EST PT ICD-10 Description Diagnosis E11.622 Type 2 diabetes mellitus with other skin ulcer L97.222 Non-pressure chronic ulcer of left calf with fat L97.212 Non-pressure chronic ulcer of right calf with fat N18.4  Chronic kidney disease, stage 4 (severe) Modifier: layer exposed layer exposed Quantity: 1 Electronic Signature(s) Signed: 03/23/2015 4:23:40 PM By: Evlyn Kanner MD, FACS Signed: 03/23/2015 5:03:43 PM By: Elliot Gurney RN, BSN, Kim RN, BSN Previous Signature: 03/23/2015 9:42:31 AM Version By: Evlyn Kanner MD, FACS Entered By: Elliot Gurney RN, BSN, Kim on 03/23/2015 09:53:14

## 2015-03-28 ENCOUNTER — Ambulatory Visit (INDEPENDENT_AMBULATORY_CARE_PROVIDER_SITE_OTHER): Payer: BC Managed Care – PPO | Admitting: Family Medicine

## 2015-03-28 ENCOUNTER — Encounter: Payer: Self-pay | Admitting: Family Medicine

## 2015-03-28 VITALS — BP 123/80 | HR 93 | Temp 97.9°F | Resp 16 | Ht 68.0 in | Wt 254.6 lb

## 2015-03-28 DIAGNOSIS — E0821 Diabetes mellitus due to underlying condition with diabetic nephropathy: Secondary | ICD-10-CM

## 2015-03-28 DIAGNOSIS — N186 End stage renal disease: Secondary | ICD-10-CM | POA: Diagnosis not present

## 2015-03-28 DIAGNOSIS — E1122 Type 2 diabetes mellitus with diabetic chronic kidney disease: Secondary | ICD-10-CM

## 2015-03-28 DIAGNOSIS — Z992 Dependence on renal dialysis: Secondary | ICD-10-CM

## 2015-03-28 NOTE — Patient Instructions (Addendum)
May discontinue Novolog as she is not taking anyway.  Cont. Other meds.  Cont.to go to dialysis and Wound CEnter.  Got flu shot at dialysis.

## 2015-03-28 NOTE — Progress Notes (Signed)
Name: [name]   MRN: [id number]    DOB: 06-01-1956   Date:03/28/2015       Progress Note  Subjective  Chief Complaint  Chief Complaint  Patient presents with  . Diabetes    highest BS 181 and lowest 101 and avarage 151    HPI  Here for f/uy of DM.  BSs range from 100-160, mostly in AM.  She is not taking Novolog tid as recommended.  She is afraid of hypoglycemia, although she has not had any episodes.    Still getting dialysis 3 d a week.    Still going to wound center one weekly and HHN comes 3 days for dressing to chnge dressing and debride ankle ulcers.  No problem-specific assessment & plan notes found for this encounter.   Past Medical History  Diagnosis Date  . Chronic kidney disease   . Hypertension   . Diabetes mellitus without complication     Social History  Substance Use Topics  . Smoking status: Never Smoker   . Smokeless tobacco: Never Used  . Alcohol Use: No     Current outpatient prescriptions:  .  ampicillin (PRINCIPEN) 500 MG capsule, , Disp: , Rfl:  .  carvedilol (COREG) 3.125 MG tablet, Take 3.125 mg by mouth 2 (two) times daily with a meal. Taking Tuesday Thursday Saturday and sunday, Disp: , Rfl:  .  fluticasone (FLONASE) 50 MCG/ACT nasal spray, Place into both nostrils as needed for allergies or rhinitis., Disp: , Rfl:  .  FREESTYLE LITE test strip, , Disp: , Rfl:  .  furosemide (LASIX) 80 MG tablet, Taking Tuesday Thursday Saturday and sunday, Disp: , Rfl:  .  HYDROcodone-acetaminophen (NORCO/VICODIN) 5-325 MG per tablet, , Disp: , Rfl:  .  LEVEMIR FLEXTOUCH 100 UNIT/ML Pen, Inject 34 Units into the skin daily at 10 pm. , Disp: , Rfl:  .  loratadine (CLARITIN) 10 MG tablet, Take 10 mg by mouth daily., Disp: , Rfl:  .  losartan (COZAAR) 50 MG tablet, Take 50 mg by mouth daily., Disp: , Rfl:  .  NOVOLOG FLEXPEN 100 UNIT/ML FlexPen, Inject 5 Units into the skin 3 (three) times daily with meals. (Patient taking differently: Inject into the  skin 3 (three) times daily with meals. 5 units am, 6 units lunch, 7 units evening), Disp: 15 mL, Rfl: 12 .  ondansetron (ZOFRAN) 4 MG tablet, , Disp: , Rfl:  .  SANTYL ointment, , Disp: , Rfl:   Allergies  Allergen Reactions  . Codeine Nausea Only    hydrocodone  . Vicodin [Hydrocodone-Acetaminophen] Nausea And Vomiting    Review of Systems  Constitutional: Positive for weight loss (desired and water weight.) and malaise/fatigue. Negative for fever and chills.  HENT: Negative for hearing loss.   Eyes: Negative for blurred vision and double vision.  Respiratory: Negative for cough, sputum production, shortness of breath and wheezing.   Cardiovascular: Positive for leg swelling. Negative for chest pain, palpitations and orthopnea.  Gastrointestinal: Negative for heartburn, nausea, vomiting, abdominal pain, diarrhea and blood in stool.  Genitourinary: Negative for dysuria, urgency and frequency.  Musculoskeletal: Negative for myalgias.  Skin: Negative for rash.  Neurological: Negative for dizziness, sensory change, focal weakness, seizures, weakness and headaches.  Psychiatric/Behavioral: Positive for depression. The patient is not nervous/anxious.       Objective  Filed Vitals:   03/28/15 0808  BP: 123/80  Pulse: 93  Temp: 97.9 F (36.6 C)  TempSrc: Oral  Resp: 16  Height: 5'  8" (1.727 m)  Weight: 254 lb 9.6 oz (115.486 kg)     Physical Exam  Constitutional: She is well-developed, well-nourished, and in no distress. No distress.  HENT:  Head: Normocephalic and atraumatic.  Eyes: Conjunctivae and EOM are normal. Pupils are equal, round, and reactive to light. No scleral icterus.  Neck: Normal range of motion. Neck supple. Carotid bruit is not present. No thyromegaly present.  Cardiovascular: Normal rate and regular rhythm.  Exam reveals no gallop and no friction rub.   Murmur heard.  Systolic murmur is present with a grade of 3/6  throughout  Pulmonary/Chest: Effort  normal and breath sounds normal. No respiratory distress. She has no wheezes. She has no rales.  Abdominal: Soft. Bowel sounds are normal. She exhibits no distension and no mass. There is no tenderness.  Musculoskeletal: She exhibits edema (trace pedal edema bilaterally).  Diabetic foot exam- Skin of feet is dry but not cracked.  Normal position sense, vibratory sense, and pin prick.  Ulcers on both lower legs were dressed and not examined today.  Cared for by Wound Center.  Lymphadenopathy:    She has no cervical adenopathy.  Vitals reviewed.     Recent Results (from the past 2160 hour(s))  Potassium     Status: None   Collection Time: 02/10/15  2:41 PM  Result Value Ref Range   Potassium 4.1 3.5 - 5.1 mmol/L  POCT HgB A1C     Status: Abnormal   Collection Time: 02/23/15  9:23 AM  Result Value Ref Range   Hemoglobin A1C 7.4   Wound culture     Status: None   Collection Time: 03/07/15 10:22 AM  Result Value Ref Range   Specimen Description LEG LEFT LOWER    Special Requests NONE    Gram Stain      RARE WBC SEEN MANY GRAM POSITIVE COCCI MANY GRAM NEGATIVE RODS    Culture      HEAVY GROWTH STENOTROPHOMONAS MALTOPHILIA HEAVY GROWTH ENTEROCOCCUS SPECIES    Report Status 03/11/2015 FINAL    Organism ID, Bacteria STENOTROPHOMONAS MALTOPHILIA    Organism ID, Bacteria ENTEROCOCCUS SPECIES       Susceptibility   Stenotrophomonas maltophilia - MIC*    LEVOFLOXACIN 1 SENSITIVE Sensitive     TRIMETH/SULFA <=20 SENSITIVE Sensitive     * HEAVY GROWTH STENOTROPHOMONAS MALTOPHILIA   Enterococcus species - MIC*    AMPICILLIN <=2 SENSITIVE Sensitive     LINEZOLID 2 SENSITIVE Sensitive     * HEAVY GROWTH ENTEROCOCCUS SPECIES  Potassium ([facility] vascular lab only)     Status: None   Collection Time: 03/13/15 10:00 AM  Result Value Ref Range   Potassium [facility] vascular lab) 3.2   Glucose, capillary     Status: Abnormal   Collection Time: 03/13/15 10:16 AM  Result Value Ref Range    Glucose-Capillary 138 (H) 65 - 99 mg/dL     Assessment & Plan  1. Diabetes mellitus due to underlying condition with diabetic nephropathy  - HM Diabetes Foot Exam

## 2015-03-30 ENCOUNTER — Telehealth: Payer: Self-pay

## 2015-03-30 ENCOUNTER — Encounter: Payer: BC Managed Care – PPO | Admitting: Surgery

## 2015-03-30 DIAGNOSIS — L97222 Non-pressure chronic ulcer of left calf with fat layer exposed: Secondary | ICD-10-CM | POA: Diagnosis not present

## 2015-03-30 NOTE — Telephone Encounter (Signed)
Test strips refilled x6 rf.

## 2015-03-30 NOTE — Telephone Encounter (Signed)
[name] called from [name] to inquire about Freestyle Strips being denied. [phone].

## 2015-03-30 NOTE — Progress Notes (Signed)
KAMYA, WATLING ([id number]) Visit Report for 03/30/2015 Arrival Information Details Patient Name: [name], [name]. Date of Service: 03/30/2015 9:30 AM Medical Record Number: [id number] Patient Account Number: [id number] Date of Birth/Sex: June 24, 1956 (58 y.o. Female) Treating RN: Afful, RN, BSN, [place] Sink Primary Care Physician: Fidel Levy Other Clinician: Referring Physician: Fidel Levy Treating Physician/Extender: Rudene Re in Treatment: 8 Visit Information History Since Last Visit Any new allergies or adverse reactions: No Patient Arrived: [name] Had a fall or experienced change in No Arrival Time: 09:27 activities of daily living that may affect Accompanied By: self risk of falls: Transfer Assistance: None Signs or symptoms of abuse/neglect since last No Patient Identification Verified: Yes visito Secondary Verification Process Yes Hospitalized since last visit: No Completed: Has Dressing in Place as Prescribed: Yes Patient Has Alerts: Yes Pain Present Now: No Patient Alerts: DMII ABI [place] Bilateral Electronic Signature(s) Signed: 03/30/2015 9:29:35 AM By: Elpidio Eric BSN, RN Entered By: Elpidio Eric on 03/30/2015 09:29:35 [name], [name] ([id number]) -------------------------------------------------------------------------------- Clinic Level of Care Assessment Details Patient Name: [name], [name] Date of Service: 03/30/2015 9:30 AM Medical Record Number: [id number] Patient Account Number: [id number] Date of Birth/Sex: 01/14/1956 (58 y.o. Female) Treating RN: Afful, RN, BSN, [place] Sink Primary Care Physician: Fidel Levy Other Clinician: Referring Physician: Fidel Levy Treating Physician/Extender: Rudene Re in Treatment: 8 Clinic Level of Care Assessment Items TOOL 4 Quantity Score []  - Use when only an EandM is performed on FOLLOW-UP visit 0 ASSESSMENTS - Nursing Assessment / Reassessment X - Reassessment of  Co-morbidities (includes updates in patient status) 1 10 X - Reassessment of Adherence to Treatment Plan 1 5 ASSESSMENTS - Wound and Skin Assessment / Reassessment []  - Simple Wound Assessment / Reassessment - one wound 0 X - Complex Wound Assessment / Reassessment - multiple wounds 5 5 []  - Dermatologic / Skin Assessment (not related to wound area) 0 ASSESSMENTS - Focused Assessment X - Circumferential Edema Measurements - multi extremities 2 5 []  - Nutritional Assessment / Counseling / Intervention 0 X - Lower Extremity Assessment (monofilament, tuning fork, pulses) 1 5 []  - Peripheral Arterial Disease Assessment (using hand held doppler) 0 ASSESSMENTS - Ostomy and/or Continence Assessment and Care []  - Incontinence Assessment and Management 0 []  - Ostomy Care Assessment and Management (repouching, etc.) 0 PROCESS - Coordination of Care X - Simple Patient / Family Education for ongoing care 1 15 []  - Complex (extensive) Patient / Family Education for ongoing care 0 []  - Staff obtains Chiropractor, Records, Test Results / Process Orders 0 []  - Staff telephones HHA, Nursing Homes / Clarify orders / etc 0 []  - Routine Transfer to another Facility (non-emergent condition) 0 [name], [name] ([id number]) []  - Routine Hospital Admission (non-emergent condition) 0 []  - New Admissions / Manufacturing engineer / Ordering NPWT, Apligraf, etc. 0 []  - Emergency Hospital Admission (emergent condition) 0 []  - Simple Discharge Coordination 0 []  - Complex (extensive) Discharge Coordination 0 PROCESS - Special Needs []  - Pediatric / Minor Patient Management 0 []  - Isolation Patient Management 0 []  - Hearing / Language / Visual special needs 0 []  - Assessment of Community assistance (transportation, D/C planning, etc.) 0 []  - Additional assistance / Altered mentation 0 []  - Support Surface(s) Assessment (bed, cushion, seat, etc.) 0 INTERVENTIONS - Wound Cleansing / Measurement []  - Simple Wound  Cleansing - one wound 0 X - Complex Wound Cleansing - multiple wounds 4 5 X - Wound Imaging (photographs - any number of  wounds) 1 5 []  - Wound Tracing (instead of photographs) 0 []  - Simple Wound Measurement - one wound 0 X - Complex Wound Measurement - multiple wounds 4 5 INTERVENTIONS - Wound Dressings []  - Small Wound Dressing one or multiple wounds 0 X - Medium Wound Dressing one or multiple wounds 4 15 []  - Large Wound Dressing one or multiple wounds 0 []  - Application of Medications - topical 0 []  - Application of Medications - injection 0 INTERVENTIONS - Miscellaneous []  - External ear exam 0 [name], [name] ([id number]) []  - Specimen Collection (cultures, biopsies, blood, body fluids, etc.) 0 []  - Specimen(s) / Culture(s) sent or taken to Lab for analysis 0 []  - Patient Transfer (multiple staff / Michiel Sites Lift / Similar devices) 0 []  - Simple Staple / Suture removal (25 or less) 0 []  - Complex Staple / Suture removal (26 or more) 0 []  - Hypo / Hyperglycemic Management (close monitor of Blood Glucose) 0 []  - Ankle / Brachial Index (ABI) - do not check if billed separately 0 X - Vital Signs 1 5 Has the patient been seen at the hospital within the last three years: Yes Total Score: 180 Level Of Care: New/Established - Level 5 Electronic Signature(s) Signed: 03/30/2015 10:08:32 AM By: Elpidio Eric BSN, RN Entered By: Elpidio Eric on 03/30/2015 10:08:32 [name], [name] ([id number]) -------------------------------------------------------------------------------- Encounter Discharge Information Details Patient Name: [name], [name] Date of Service: 03/30/2015 9:30 AM Medical Record Number: [id number] Patient Account Number: [id number] Date of Birth/Sex: Nov 17, 1955 (58 y.o. Female) Treating RN: Clover Mealy, RN, BSN, [place] Sink Primary Care Physician: Fidel Levy Other Clinician: Referring Physician: Fidel Levy Treating Physician/Extender: Rudene Re in Treatment:  8 Encounter Discharge Information Items Discharge Pain Level: 0 Discharge Condition: Stable Ambulatory Status: Cane Discharge Destination: Home Transportation: Private Auto Accompanied By: self Schedule Follow-up Appointment: No Medication Reconciliation completed No and provided to Patient/Care Alok Minshall: Provided on Clinical Summary of Care: 03/30/2015 Form Type Recipient Paper Patient PM Electronic Signature(s) Signed: 03/30/2015 10:09:25 AM By: Elpidio Eric BSN, RN Previous Signature: 03/30/2015 10:05:09 AM Version By: Gwenlyn Perking Entered By: Elpidio Eric on 03/30/2015 10:09:25 [name], [name] ([id number]) -------------------------------------------------------------------------------- Lower Extremity Assessment Details Patient Name: Siler, Darshay H. Date of Service: 03/30/2015 9:30 AM Medical Record Number: [id number] Patient Account Number: [id number] Date of Birth/Sex: November 05, 1955 (58 y.o. Female) Treating RN: Afful, RN, BSN, [place] Sink Primary Care Physician: Fidel Levy Other Clinician: Referring Physician: Fidel Levy Treating Physician/Extender: Rudene Re in Treatment: 8 Edema Assessment Assessed: [Left: No] [Right: No] E[Left: dema] [Right: :] Calf Left: Right: Point of Measurement: 34 cm From Medial Instep 48.5 cm 45.6 cm Ankle Left: Right: Point of Measurement: 10 cm From Medial Instep 26.2 cm 25.8 cm Vascular Assessment Claudication: Claudication Assessment [Left:None] [Right:None] Pulses: Posterior Tibial Dorsalis Pedis Palpable: [Left:Yes] [Right:Yes] Extremity colors, hair growth, and conditions: Extremity Color: [Left:Mottled] [Right:Mottled] Hair Growth on Extremity: [Left:No] [Right:No] Temperature of Extremity: [Left:Warm] [Right:Warm] Capillary Refill: [Left:< 3 seconds] [Right:< 3 seconds] Toe Nail Assessment Left: Right: Thick: No No Discolored: No No Deformed: No No Improper Length and Hygiene: No No Electronic  Signature(s) Signed: 03/30/2015 9:32:38 AM By: Elpidio Eric BSN, RN Entered By: Elpidio Eric on 03/30/2015 09:32:37 [name], [name] ([id number]) [name], [name] ([id number]) -------------------------------------------------------------------------------- Multi Wound Chart Details Patient Name: Masullo, Lynnann H. Date of Service: 03/30/2015 9:30 AM Medical Record Number: [id number] Patient Account Number: [id number] Date of Birth/Sex: 1955-08-18 (58 y.o. 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] [2:Calciphylaxis] [3:Calciphylaxis] Comorbid History: [1:N/A] [2:N/A] [3:N/A] Date Acquired: [1:01/09/2015] [2:01/09/2015] [3:01/09/2015] Weeks of Treatment: [1:8] [2:8] [3:8] Wound Status: [1:Open] [2:Open] [3:Open] Measurements L x W x D 2.5x2.4x0.1 [2:2.3x1.8x0.2] [3:24x13x0.5] (cm) Area (cm) : [1:4.712] [2:3.252] [3:245.044] Volume (cm) : [1:0.471] [2:0.65] [3:122.522] % Reduction in Area: [1:-9925.50%] [2:-3359.60%] [3:-2128.50%] % Reduction in Volume: -9320.00% [2:-7122.20%] [3:-11038.40%] Classification: [1:Full Thickness Without Exposed Support Structures] [2:Full Thickness Without Exposed Support Structures] [3:Full Thickness Without Exposed Support Structures] HBO Classification: [1:N/A] [2:N/A] [3:N/A] Exudate Amount: [1:N/A] [2:N/A] [3:N/A] Wound Margin: [1:N/A] [2:N/A]  [3:N/A] Granulation Amount: [1:N/A] [2:N/A] [3:N/A] Granulation Quality: [1:N/A] [2:N/A] [3:N/A] Necrotic Amount: [1:N/A] [2:N/A] [3:N/A] Necrotic Tissue: [1:N/A] [2:N/A] [3:N/A] Epithelialization: [1:N/A] [2:N/A] [3:N/A] Periwound Skin Texture: No Abnormalities Noted No Abnormalities Noted [3:No Abnormalities Noted] Periwound Skin [1:No Abnormalities Noted No Abnormalities Noted] [3:No Abnormalities Noted] Moisture: Periwound Skin Color: No Abnormalities Noted No Abnormalities Noted [3:No Abnormalities Noted] Temperature: N/A N/A N/A Tenderness on No No No Palpation: Wound Preparation: N/A N/A N/A Wound Number: 8 N/A N/A Photos: No Photos N/A N/A Wound Location: Right Lower Leg - N/A N/A Posterior Wounding Event: Gradually Appeared N/A N/A Primary Etiology: Calciphylaxis N/A N/A Comorbid History: Hypertension, Type II N/A N/A Diabetes Date Acquired: 03/27/2015 N/A N/A Weeks of Treatment: 0 N/A N/A Wound Status: Open N/A N/A Measurements L x W x D 1.5x1.5x0.1 N/A N/A (cm) Area (cm) : 1.767 N/A N/A Volume (cm) : 0.177 N/A N/A % Reduction in Area: 0.00% N/A N/A % Reduction in Volume: 0.00% N/A N/A Classification: Partial Thickness N/A N/A HBO Classification: Grade 1 N/A N/A Exudate Amount: Small N/A N/A Wound Margin: Distinct, outline attached N/A N/A Granulation Amount: Small (1-33%) N/A N/A Granulation Quality: Pink N/A N/A Necrotic Amount: Medium (34-66%) N/A N/A Necrotic Tissue: Eschar, Adherent Slough N/A N/A Exposed Structures: Fascia: No N/A N/A Fat: No Tendon: No Muscle: No Joint: No Bone: No Limited to Skin Breakdown Epithelialization: None N/A N/A Periwound Skin Texture: Edema: Yes N/A N/A Excoriation: No Induration: No Callus: No Crepitus: No Fluctuance: No Friable: No Rash: No Scarring: No N/A N/A [name], [name] ([id number]) Periwound Skin Moist: Yes Moisture: Maceration: No Dry/Scaly: No Periwound Skin Color: Atrophie Blanche: No N/A  N/A Cyanosis: No Ecchymosis: No Erythema: No Hemosiderin Staining: No Mottled: No Pallor: No Rubor: No Temperature: No Abnormality N/A N/A Tenderness on Yes N/A N/A Palpation: Wound Preparation: Ulcer Cleansing: N/A N/A Rinsed/Irrigated with Saline Topical Anesthetic Applied: Other: lidocaine4% Treatment Notes Electronic Signature(s) Signed: 03/30/2015 9:46:42 AM By: Elpidio Eric BSN, RN Entered By: Elpidio Eric on 03/30/2015 09:46:42 [place], [name] ([id number]) -------------------------------------------------------------------------------- Multi-Disciplinary Care Plan Details Patient Name: Keeran, Coletta H. Date of Service: 03/30/2015 9:30 AM Medical Record Number: [id number] Patient Account Number: [id number] Date of Birth/Sex: [birth year] (58 y.o. Female) Treating RN: Clover Mealy, RN, BSN, [place] Sink Primary Care Physician: Fidel Levy Other Clinician: Referring Physician: Fidel Levy Treating Physician/Extender: Rudene Re in Treatment: 8 Active Inactive Orientation to the Wound Care Program Nursing Diagnoses: Knowledge deficit related to the wound healing center program Goals: Patient/caregiver will verbalize understanding of the Wound Healing  Center Program Date Initiated: 01/30/2015 Goal Status: Active Interventions: Provide education on orientation to the wound center Notes: Pain, Acute or Chronic Nursing Diagnoses: Pain, acute or chronic: actual or potential Goals: Patient will verbalize adequate pain control and receive pain control interventions during procedures as needed Date Initiated: 01/30/2015 Goal Status: Active Patient/caregiver will verbalize adequate pain control between visits Date Initiated: 01/30/2015 Goal Status: Active Interventions: Assess comfort goal upon admission Encourage patient to take pain medications as prescribed Notes: Venous Leg Ulcer Nursing Diagnoses: Corpening, Lizzie H. ([id number]) Potential for venous  Insuffiency (use before diagnosis confirmed) Goals: Non-invasive venous studies are completed as ordered Date Initiated: 01/30/2015 Goal Status: Active Interventions: Assess peripheral edema status every visit. Notes: Wound/Skin Impairment Nursing Diagnoses: Impaired tissue integrity Goals: Ulcer/skin breakdown will have a volume reduction of 30% by week 4 Date Initiated: 01/30/2015 Goal Status: Active Ulcer/skin breakdown will have a volume reduction of 50% by week 8 Date Initiated: 01/30/2015 Goal Status: Active Ulcer/skin breakdown will have a volume reduction of 80% by week 12 Date Initiated: 01/30/2015 Goal Status: Active Ulcer/skin breakdown will heal within 14 weeks Date Initiated: 01/30/2015 Goal Status: Active Interventions: Assess ulceration(s) every visit Notes: Electronic Signature(s) Signed: 03/30/2015 9:43:18 AM By: Elpidio Eric BSN, RN Entered By: Elpidio Eric on 03/30/2015 09:43:18 Ranum, Rhesa H. ([id number]) -------------------------------------------------------------------------------- Pain Assessment Details Patient Name: Luffman, Akaila H. Date of Service: 03/30/2015 9:30 AM Medical Record Number: [id number] Patient Account Number: [id number] Date of Birth/Sex: [birth year] (58 y.o. Female) Treating RN: Clover Mealy, RN, BSN, [place] Sink Primary Care Physician: Fidel Levy Other Clinician: Referring Physician: Fidel Levy Treating Physician/Extender: Rudene Re in Treatment: 8 Active Problems Location of Pain Severity and Description of Pain Patient Has Paino No Site Locations Pain Management and Medication Current Pain Management: Electronic Signature(s) Signed: 03/30/2015 9:29:41 AM By: Elpidio Eric BSN, RN Entered By: Elpidio Eric on 03/30/2015 09:29:41 Mella, [name] ([id number]) -------------------------------------------------------------------------------- Patient/Caregiver Education Details Patient Name: Borunda, Dajia H. Date of  Service: 03/30/2015 9:30 AM Medical Record Number: [id number] Patient Account Number: [id number] Date of Birth/Gender: [birth year] (58 y.o. Female) Treating RN: Clover Mealy, RN, BSN, [place] Sink Primary Care Physician: Fidel Levy Other Clinician: Referring Physician: Fidel Levy Treating Physician/Extender: Rudene Re in Treatment: 8 Education Assessment Education Provided To: Patient Education Topics Provided Basic Hygiene: Methods: Explain/Verbal Responses: State content correctly Welcome To The Wound Care Center: Methods: Explain/Verbal Responses: State content correctly Electronic Signature(s) Signed: 03/30/2015 10:09:39 AM By: Elpidio Eric BSN, RN Entered By: Elpidio Eric on 03/30/2015 10:09:39 [place], [name] ([id number]) -------------------------------------------------------------------------------- Wound Assessment Details Patient Name: Geraci, Jewels H. Date of Service: 03/30/2015 9:30 AM Medical Record Number: [id number] Patient Account Number: [id number] Date of Birth/Sex: Jan 31, 1956 (58 y.o. Female) Treating RN: Afful, RN, BSN, [place] Sink Primary Care Physician: Fidel Levy Other Clinician: Referring Physician: Fidel Levy Treating Physician/Extender: Rudene Re in Treatment: 8 Wound Status Wound Number: 1 Primary Etiology: Calciphylaxis Wound Location: Right Lower Leg - Proximal Wound Status: Open Wounding Event: Gradually Appeared Comorbid History: Hypertension, Type II Diabetes Date Acquired: 01/09/2015 Weeks Of Treatment: 8 Clustered Wound: No Wound Measurements Length: (cm) 2.5 Width: (cm) 2.4 Depth: (cm) 0.1 Area: (cm) 4.712 Volume: (cm) 0.471 % Reduction in Area: -9925.5% % Reduction in Volume: -9320% Epithelialization: None Tunneling: No Undermining: No Wound Description Full Thickness Without Exposed Classification: Support Structures Diabetic Severity Grade 1 (Wagner): Wound Margin: Distinct, outline  attached Exudate Amount: Small Exudate Type: Serosanguineous Exudate Color: red, brown Foul Odor After Cleansing: No Wound  Bed Granulation Amount: None Present (0%) Exposed Structure Necrotic Amount: Large (67-100%) Fascia Exposed: No Necrotic Quality: Eschar, Adherent Slough Fat Layer Exposed: No Tendon Exposed: No Muscle Exposed: No Joint Exposed: No Bone Exposed: No Limited to Skin Breakdown Periwound Skin Texture Texture Color No Abnormalities Noted: No No Abnormalities Noted: No Callus: No Atrophie Blanche: No [name], [name] ([id number]) Crepitus: No Cyanosis: No Excoriation: No Ecchymosis: No Fluctuance: No Erythema: No Friable: No Hemosiderin Staining: No Induration: No Mottled: No Localized Edema: Yes Pallor: No Rash: No Rubor: No Scarring: No Temperature / Pain Moisture Temperature: No Abnormality No Abnormalities Noted: No Tenderness on Palpation: Yes Dry / Scaly: Yes Maceration: No Moist: Yes Wound Preparation Ulcer Cleansing: Rinsed/Irrigated with Saline Topical Anesthetic Applied: Other: lidocaine 4%, Treatment Notes Wound #1 (Right, Proximal Lower Leg) 1. Cleansed with: Clean wound with Normal Saline 2. Anesthetic Topical Lidocaine 4% cream to wound bed prior to debridement 4. Dressing Applied: Santyl Ointment 5. Secondary Dressing Applied ABD and Kerlix/Conform 7. Secured with International aid/development worker) Signed: 03/30/2015 9:50:41 AM By: Elpidio Eric BSN, RN Entered By: Elpidio Eric on 03/30/2015 09:50:41 [place], [name] ([id number]) -------------------------------------------------------------------------------- Wound Assessment Details Patient Name: Danese, Denzil H. Date of Service: 03/30/2015 9:30 AM Medical Record Number: [id number] Patient Account Number: [id number] Date of Birth/Sex: [birth year] (58 y.o. Female) Treating RN: Afful, RN, BSN, [place] Sink Primary Care Physician: Fidel Levy Other Clinician: Referring  Physician: Fidel Levy Treating Physician/Extender: Rudene Re in Treatment: 8 Wound Status Wound Number: 2 Primary Etiology: Calciphylaxis Wound Location: Right Lower Leg - Distal Wound Status: Open Wounding Event: Gradually Appeared Comorbid History: Hypertension, Type II Diabetes Date Acquired: 01/09/2015 Weeks Of Treatment: 8 Clustered Wound: No Wound Measurements Length: (cm) 2.3 Width: (cm) 1.8 Depth: (cm) 0.2 Area: (cm) 3.252 Volume: (cm) 0.65 % Reduction in Area: -3359.6% % Reduction in Volume: -7122.2% Epithelialization: None Tunneling: No Undermining: No Wound Description Full Thickness Without Exposed Classification: Support Structures Diabetic Severity Grade 1 (Wagner): Wound Margin: Distinct, outline attached Exudate Amount: Small Exudate Type: Serosanguineous Exudate Color: red, brown Foul Odor After Cleansing: No Wound Bed Granulation Amount: None Present (0%) Exposed Structure Necrotic Amount: Large (67-100%) Fascia Exposed: No Necrotic Quality: Eschar, Adherent Slough Fat Layer Exposed: No Tendon Exposed: No Muscle Exposed: No Joint Exposed: No Bone Exposed: No Limited to Skin Breakdown Periwound Skin Texture Texture Color No Abnormalities Noted: No No Abnormalities Noted: No Callus: No Atrophie Blanche: No [name], [name] ([id number]) Crepitus: No Cyanosis: No Excoriation: No Ecchymosis: No Fluctuance: No Erythema: No Friable: No Hemosiderin Staining: No Induration: No Mottled: No Localized Edema: Yes Pallor: No Rash: No Rubor: No Scarring: No Temperature / Pain Moisture Temperature: No Abnormality No Abnormalities Noted: No Tenderness on Palpation: Yes Dry / Scaly: Yes Maceration: No Moist: Yes Wound Preparation Ulcer Cleansing: Rinsed/Irrigated with Saline Topical Anesthetic Applied: Other: lidocaine 4%, Treatment Notes Wound #2 (Right, Distal Lower Leg) 1. Cleansed with: Clean wound with Normal  Saline 2. Anesthetic Topical Lidocaine 4% cream to wound bed prior to debridement 4. Dressing Applied: Santyl Ointment 5. Secondary Dressing Applied ABD and Kerlix/Conform 7. Secured with International aid/development worker) Signed: 03/30/2015 9:51:01 AM By: Elpidio Eric BSN, RN Entered By: Elpidio Eric on 03/30/2015 09:51:01 Echeverria, [name] ([id number]) -------------------------------------------------------------------------------- Wound Assessment Details Patient Name: Knab, Shenaya H. Date of Service: 03/30/2015 9:30 AM Medical Record Number: [id number] Patient Account Number: [id number] Date of Birth/Sex: 10/29/1955 (58 y.o. Female) Treating RN: Afful, RN, BSN, [place] Sink Primary Care Physician: Fidel Levy Other  Clinician: Referring Physician: Fidel Levy Treating Physician/Extender: Rudene Re in Treatment: 8 Wound Status Wound Number: 3 Primary Etiology: Calciphylaxis Wound Location: Left Lower Leg - Lateral Wound Status: Open Wounding Event: Gradually Appeared Comorbid History: Hypertension, Type II Diabetes Date Acquired: 01/09/2015 Weeks Of Treatment: 8 Clustered Wound: No Wound Measurements Length: (cm) 30 Width: (cm) 19 Depth: (cm) 0.5 Area: (cm) 447.677 Volume: (cm) 223.838 % Reduction in Area: -3971.3% % Reduction in Volume: -20248.9% Epithelialization: Small (1-33%) Tunneling: No Undermining: No Wound Description Full Thickness Without Classification: Exposed Support Structures Diabetic Severity Grade 2 (Wagner): Wound Margin: Flat and Intact Exudate Amount: Medium Exudate Type: Purulent Exudate Color: yellow, brown, green Foul Odor After Cleansing: Yes Due to Product Use: No Wound Bed Granulation Amount: None Present (0%) Exposed Structure Necrotic Amount: Large (67-100%) Fascia Exposed: No Necrotic Quality: Eschar, Adherent Slough Fat Layer Exposed: No Tendon Exposed: No Muscle Exposed: No Joint Exposed: No Bone  Exposed: No Limited to Skin Breakdown Periwound Skin Texture Texture Color No Abnormalities Noted: No No Abnormalities Noted: No Callus: No Atrophie Blanche: No [name], [name] ([id number]) Crepitus: No Cyanosis: No Excoriation: No Ecchymosis: No Fluctuance: No Erythema: No Friable: No Hemosiderin Staining: No Induration: No Mottled: No Localized Edema: Yes Pallor: No Rash: No Rubor: No Scarring: No Temperature / Pain Moisture Temperature: No Abnormality No Abnormalities Noted: No Tenderness on Palpation: Yes Dry / Scaly: No Maceration: Yes Moist: Yes Wound Preparation Ulcer Cleansing: Rinsed/Irrigated with Saline Topical Anesthetic Applied: Other: lidocaine 4%, Treatment Notes Wound #3 (Left, Lateral Lower Leg) 1. Cleansed with: Clean wound with Normal Saline 2. Anesthetic Topical Lidocaine 4% cream to wound bed prior to debridement 4. Dressing Applied: Santyl Ointment 5. Secondary Dressing Applied ABD and Kerlix/Conform 7. Secured with International aid/development worker) Signed: 03/30/2015 9:51:35 AM By: Elpidio Eric BSN, RN Entered By: Elpidio Eric on 03/30/2015 09:51:35 [name], [name] ([id number]) -------------------------------------------------------------------------------- Wound Assessment Details Patient Name: [name], [name] Date of Service: 03/30/2015 9:30 AM Medical Record Number: [id number] Patient Account Number: [id number] Date of Birth/Sex: 03-27-1956 (58 y.o. Female) Treating RN: Afful, RN, BSN, [place]y [place]ne Sink Primary Care Physician: Fidel Levy Other Clinician: Referring Physician: Fidel Levy Treating Physician/Extender: Rudene Re in Treatment: 8 Wound Status Wound Number: 8 Primary Etiology: Calciphylaxis Wound Location: Right Lower Leg - Posterior Wound Status: Open Wounding Event: Gradually Appeared Comorbid History: Hypertension, Type II Diabetes Date Acquired: 03/27/2015 Weeks Of Treatment: 0 Clustered Wound:  No Wound Measurements Length: (cm) 1.5 Width: (cm) 1.5 Depth: (cm) 0.1 Area: (cm) 1.767 Volume: (cm) 0.177 % Reduction in Area: 0% % Reduction in Volume: 0% Epithelialization: None Tunneling: No Undermining: No Wound Description Classification: Partial Thickness Foul O Diabetic Severity (Wagner): Grade 1 Wound Margin: Distinct, outline attached Exudate Amount: Small dor After Cleansing: No Wound Bed Granulation Amount: Small (1-33%) Exposed Structure Granulation Quality: Pink Fascia Exposed: No Necrotic Amount: Medium (34-66%) Fat Layer Exposed: No Necrotic Quality: Eschar, Adherent Slough Tendon Exposed: No Muscle Exposed: No Joint Exposed: No Bone Exposed: No Limited to Skin Breakdown Periwound Skin Texture Texture Color No Abnormalities Noted: No No Abnormalities Noted: No Callus: No Atrophie Blanche: No Crepitus: No Cyanosis: No Excoriation: No Ecchymosis: No Fluctuance: No Erythema: No Friable: No Hemosiderin Staining: No [name], Jenness H. ([id number]) Induration: No Mottled: No Localized Edema: Yes Pallor: No Rash: No Rubor: No Scarring: No Temperature / Pain Moisture Temperature: No Abnormality No Abnormalities Noted: No Tenderness on Palpation: Yes Dry / Scaly: No Maceration: No Moist: Yes Wound Preparation Ulcer  Cleansing: Rinsed/Irrigated with Saline Topical Anesthetic Applied: Other: lidocaine4%, Treatment Notes Wound #8 (Right, Posterior Lower Leg) 1. Cleansed with: Clean wound with Normal Saline 2. Anesthetic Topical Lidocaine 4% cream to wound bed prior to debridement 4. Dressing Applied: Santyl Ointment 5. Secondary Dressing Applied ABD and Kerlix/Conform 7. Secured with International aid/development worker) Signed: 03/30/2015 9:43:05 AM By: Elpidio Eric BSN, RN Entered By: Elpidio Eric on 03/30/2015 09:43:05 [place], [name] ([id number]) -------------------------------------------------------------------------------- Vitals  Details Patient Name: Getty, Bernedette H. Date of Service: 03/30/2015 9:30 AM Medical Record Number: [id number] Patient Account Number: [id number] Date of Birth/Sex: August 22, 1955 (58 y.o. Female) Treating RN: Afful, RN, BSN,  Sink Primary Care Physician: Fidel Levy Other Clinician: Referring Physician: Fidel Levy Treating Physician/Extender: Rudene Re in Treatment: 8 Vital Signs Time Taken: 09:29 Temperature (F): 98.1 Height (in): 68 Pulse (bpm): 81 Weight (lbs): 294 Respiratory Rate (breaths/min): 18 Body Mass Index (BMI): 44.7 Blood Pressure (mmHg): 125/58 Reference Range: 80 - 120 mg / dl Electronic Signature(s) Signed: 03/30/2015 9:33:06 AM By: Elpidio Eric BSN, RN Previous Signature: 03/30/2015 9:31:38 AM Version By: Elpidio Eric BSN, RN Entered By: Elpidio Eric on 03/30/2015 09:33:06

## 2015-03-31 NOTE — Progress Notes (Addendum)
LATONDRA, GEBHART ([id number]) Visit Report for 03/30/2015 Chief Complaint Document Details Patient Name: [name], [name]. Date of Service: 03/30/2015 9:30 AM Medical Record Patient Account Number: [id number] Number: Afful, RN, BSN, Treating RN: 15-Jul-1956 (59 y.o. [place] Sink Date of Birth/Sex: Female) Other Clinician: Primary Care Physician: Jonna Clark, JAMES Treating Evlyn Kanner Referring Physician: Fidel Levy Physician/Extender: Tania Ade in Treatment: 8 Information Obtained from: Patient Chief Complaint Patient presents to the wound care center for a consult due non healing wound. This 59 year old patient has had ulcerated areas on the left lower extremity more than the right lower extremity to very painful for the last 3 weeks. Electronic Signature(s) Signed: 03/30/2015 9:55:01 AM By: Evlyn Kanner MD, FACS Entered By: Evlyn Kanner on 03/30/2015 09:55:01 Sulton, [name] ([id number]) -------------------------------------------------------------------------------- HPI Details Patient Name: [place], [place] H. Date of Service: 03/30/2015 9:30 AM Medical Record Patient Account Number: [id number] Number: Afful, RN, BSN, Treating RN: 1955-07-24 (59 y.o. [place] Sink Date of Birth/Sex: Female) Other Clinician: Primary Care Physician: Jonna Clark, JAMES Treating Evlyn Kanner Referring Physician: Fidel Levy Physician/Extender: Tania Ade in Treatment: 8 History of Present Illness Location: Painful ulcerated areas left lower extremity more than right lower extremity Quality: Patient reports experiencing a sharp pain to affected area(s). Severity: Patient states wound are getting worse. Duration: Patient has had the wound for < 4 weeks prior to presenting for treatment Timing: Pain in wound is Intermittent (comes and goes Context: The wound appeared gradually over time Modifying Factors: Other treatment(s) tried include: she has had Keflex for about 10  days. Associated Signs and Symptoms: Patient reports having difficulty standing for long periods. HPI Description: 59 year old patient recently seen by her PCP Dr. Venora Maples who saw her on 01/25/2015 4 ulcers both lower extremities which have been present for about 3 weeks. She took a ten-day course of Keflex and continues to have leg pain and swelling. Past medical history significant for chronic kidney disease, hypertension and diabetes mellitus without complications. She has never been a smoker. Mostly recent CBC was within normal limits, BMP showed that her glucose was 136 and a beard was 45 and a creatinine is 4.19. The patient has had chronic renal failure and had a left AV fistula placed by Dr. Wyn Quaker in May and she is getting ready for hemodialysis soon. She has not been put on any specific medications for this problem. Addendum: tried to get Dr. 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 all questions answered. 02/07/2015 -- the patient started hemodialysis yesterday but was not successful and is going to be seeing Dr. Wyn Quaker regain. She also has a vascular workup for early August. No further investigations or referrals have been made after my conversation with her PCPs office last week. I have asked the patient to call her PCPs office and discuss further investigations and referrals. 02/14/2015 -- she has had 2 sessions of hemodialysis via the right IJ dialysis catheter. She has not seen her PCP or Dr. Wyn Quaker yet. Part of a vascular workup was done today and the next is sometime later in the month. Unfortunately she does not agree to any surgical intervention at this stage and I have not been able to refer her to a surgeon for debridement in the OR. 02/21/2015 -- she has not decided to proceed with surgery yet and is  going to see  her PCP tomorrow for more discussions. I have offered to talk to her specialists at any time and she does understand that. 03/07/15 -- she has come to see as back after 2 weeks and says that she can only come once in 2 weeks because of social issues. This time around she agrees to see Dr. dew and discuss surgical intervention and I will put in a call to him. Addendum: I got to speak to Dr. Festus Barren who kindly agreed to take her up to the OR for a debridement and punch biopsies. He will also work on her peripheral vascular workup to see if any angioplasty will help. AZELEA, SEGUIN ([id number]) 03/16/2015 cultures taken on 823 showed that she had a heavy growth of Stenotrophomonas maltophilia and enterococcus species. These were sensitive to levofloxacin, Septra, ampicillin. The patient is already on doxycycline. she was taken to the OR on 03/13/2015 for a angioplasty of her left arm AV fistula, which treated the cephalic vein near the subclavian vein confluence. The patient did not have any debridement of her left lower extremity and no biopsies were taken during the procedure done by Dr. Wyn Quaker. 03/22/2015 -- she has a pending appointment with Dr. Wyn Quaker in the next week and I have asked her to address going to the operating room and getting an opinion regarding this debridement and possible vascular workup. 03/30/2015 -- today she informs me that her appointment with Dr. Wyn Quaker is not up to the end of October. Electronic Signature(s) Signed: 03/30/2015 9:55:34 AM By: Evlyn Kanner MD, FACS Entered By: Evlyn Kanner on 03/30/2015 09:55:33 Trew, [name] ([id number]) -------------------------------------------------------------------------------- Physical Exam Details Patient Name: Poullard, Kelsie H. Date of Service: 03/30/2015 9:30 AM Medical Record Patient Account Number: [id number] Number: Afful, RN, BSN, Treating RN: 08/12/1955 (59 y.o. [place] Sink Date of Birth/Sex: Female) Other  Clinician: Primary Care Physician: Jonna Clark, Rhona Leavens Referring Physician: Fidel Levy Physician/Extender: Tania Ade in Treatment: 8 Constitutional . Pulse regular. Respirations normal and unlabored. Afebrile. . Eyes Nonicteric. Reactive to light. Ears, Nose, Mouth, and Throat Lips, teeth, and gums WNL.Marland Kitchen Moist mucosa without lesions . Neck supple and nontender. No palpable supraclavicular or cervical adenopathy. Normal sized without goiter. Respiratory WNL. No retractions.. Cardiovascular Pedal Pulses WNL. No clubbing, cyanosis or edema. Lymphatic No adneopathy. No adenopathy. No adenopathy. Musculoskeletal Adexa without tenderness or enlargement.. Digits and nails w/o clubbing, cyanosis, infection, petechiae, ischemia, or inflammatory conditions.. Integumentary (Hair, Skin) No suspicious lesions. No crepitus or fluctuance. No peri-wound warmth or erythema. No masses.Marland Kitchen Psychiatric Judgement and insight Intact.. No evidence of depression, anxiety, or agitation.. Notes The left lower extremity has multiple areas with eschar and with slough and is very tender to touch. The right lower extremity has a couple of areas which are covered with eschar and have some subcutaneous debris. the patient does not have any evidence of pus pockets or cellulitis of the rest of the leg. Electronic Signature(s) Signed: 03/30/2015 9:56:41 AM By: Evlyn Kanner MD, FACS Entered By: Evlyn Kanner on 03/30/2015 09:56:41 Gubler, [name] ([id number]) -------------------------------------------------------------------------------- Physician Orders Details Patient Name: Cuny, Lendy H. Date of Service: 03/30/2015 9:30 AM Medical Record Patient Account Number: [id number] Number: Afful, RN, BSN, Treating RN: 1955-10-02 (58 y.o. [place] Sink Date of Birth/Sex: Female) Other Clinician: Primary Care Physician: Jonna Clark, JAMES Treating Evlyn Kanner Referring Physician:  Fidel Levy Physician/Extender: Tania Ade in Treatment: 8 Verbal / Phone Orders: Yes Clinician: Afful, RN, BSN, Rita Read Back and Verified:  Yes Diagnosis Coding Wound Cleansing Wound #1 Right,Proximal Lower Leg o Clean wound with Normal Saline. o May Shower, gently pat wound dry prior to applying new dressing. Wound #2 Right,Distal Lower Leg o Clean wound with Normal Saline. o May Shower, gently pat wound dry prior to applying new dressing. Wound #3 Left,Lateral Lower Leg o Clean wound with Normal Saline. o May Shower, gently pat wound dry prior to applying new dressing. Wound #8 Right,Posterior Lower Leg o Clean wound with Normal Saline. o May Shower, gently pat wound dry prior to applying new dressing. Anesthetic Wound #1 Right,Proximal Lower Leg o Topical Lidocaine 4% cream applied to wound bed prior to debridement Wound #2 Right,Distal Lower Leg o Topical Lidocaine 4% cream applied to wound bed prior to debridement Wound #3 Left,Lateral Lower Leg o Topical Lidocaine 4% cream applied to wound bed prior to debridement Wound #8 Right,Posterior Lower Leg o Topical Lidocaine 4% cream applied to wound bed prior to debridement Primary Wound Dressing Wound #1 Right,Proximal Lower Leg o Santyl Ointment Wound #2 Right,Distal Lower Leg Delk, Cherrish H. ([id number]) o Santyl Ointment Wound #3 Left,Lateral Lower Leg o Santyl Ointment Wound #8 Right,Posterior Lower Leg o Santyl Ointment Secondary Dressing Wound #1 Right,Proximal Lower Leg o ABD and Kerlix/Conform Wound #3 Left,Lateral Lower Leg o ABD and Kerlix/Conform Wound #8 Right,Posterior Lower Leg o ABD and Kerlix/Conform Dressing Change Frequency Wound #1 Right,Proximal Lower Leg o Change Dressing Monday, Wednesday, Friday Wound #2 Right,Distal Lower Leg o Change Dressing Monday, Wednesday, Friday Wound #3 Left,Lateral Lower Leg o Change Dressing Monday, Wednesday,  Friday Wound #8 Right,Posterior Lower Leg o Change Dressing Monday, Wednesday, Friday Follow-up Appointments Wound #1 Right,Proximal Lower Leg o Return Appointment in 2 weeks. Wound #2 Right,Distal Lower Leg o Return Appointment in 2 weeks. Wound #3 Left,Lateral Lower Leg o Return Appointment in 2 weeks. Wound #8 Right,Posterior Lower Leg o Return Appointment in 2 weeks. Edema Control Wound #1 Right,Proximal Lower Leg o Tubigrip Beg, Desaree H. ([id number]) Wound #2 Right,Distal Lower Leg o Tubigrip Wound #3 Left,Lateral Lower Leg o Tubigrip Wound #8 Right,Posterior Lower Leg o Tubigrip Home Health Wound #1 Right,Proximal Lower Leg o Continue Home Health Visits - [facility] o Home Health Nurse may visit PRN to address patientos wound care needs. o FACE TO FACE ENCOUNTER: MEDICARE and MEDICAID PATIENTS: I certify that this patient is under my care and that I had a face-to-face encounter that meets the physician face-to-face encounter requirements with this patient on this date. The encounter with the patient was in whole or in part for the following MEDICAL CONDITION: (primary reason for Home Healthcare) MEDICAL NECESSITY: I certify, that based on my findings, NURSING services are a medically necessary home health service. HOME BOUND STATUS: I certify that my clinical findings support that this patient is homebound (i.e., Due to illness or injury, pt requires aid of supportive devices such as crutches, cane, wheelchairs, walkers, the use of special transportation or the assistance of another person to leave their place of residence. There is a normal inability to leave the home and doing so requires considerable and taxing effort. Other absences are for medical reasons / religious services and are infrequent or of short duration when for other reasons). o If current dressing causes regression in wound condition, may D/C ordered dressing product/s and  apply Normal Saline Moist Dressing daily until next Wound Healing Center / Other MD appointment. Notify Wound Healing Center of regression in wound condition at [phone]. o Please direct any NON-WOUND  related issues/requests for orders to patient's Primary Care Physician Wound #2 Right,Distal Lower Leg o Continue Home Health Visits - [facility] o Home Health Nurse may visit PRN to address patientos wound care needs. o FACE TO FACE ENCOUNTER: MEDICARE and MEDICAID PATIENTS: I certify that this patient is under my care and that I had a face-to-face encounter that meets the physician face-to-face encounter requirements with this patient on this date. The encounter with the patient was in whole or in part for the following MEDICAL CONDITION: (primary reason for Home Healthcare) MEDICAL NECESSITY: I certify, that based on my findings, NURSING services are a medically necessary home health service. HOME BOUND STATUS: I certify that my clinical findings support that this patient is homebound (i.e., Due to illness or injury, pt requires aid of supportive devices such as crutches, cane, wheelchairs, walkers, the use of special transportation or the assistance of another person to leave their place of residence. There is a normal inability to leave the home and doing so requires considerable and taxing effort. Other absences are for medical reasons / religious services and are infrequent or of short duration when for other reasons). o If current dressing causes regression in wound condition, may D/C ordered dressing product/s and apply Normal Saline Moist Dressing daily until next Wound Healing Center / Other MD appointment. Notify Wound Healing Center of regression in wound condition at [phone]. DREAM, HARMAN ([id number]) o Please direct any NON-WOUND related issues/requests for orders to patient's Primary Care Physician Wound #3 Left,Lateral Lower Leg o Continue Home  Health Visits - [facility] o Home Health Nurse may visit PRN to address patientos wound care needs. o FACE TO FACE ENCOUNTER: MEDICARE and MEDICAID PATIENTS: I certify that this patient is under my care and that I had a face-to-face encounter that meets the physician face-to-face encounter requirements with this patient on this date. The encounter with the patient was in whole or in part for the following MEDICAL CONDITION: (primary reason for Home Healthcare) MEDICAL NECESSITY: I certify, that based on my findings, NURSING services are a medically necessary home health service. HOME BOUND STATUS: I certify that my clinical findings support that this patient is homebound (i.e., Due to illness or injury, pt requires aid of supportive devices such as crutches, cane, wheelchairs, walkers, the use of special transportation or the assistance of another person to leave their place of residence. There is a normal inability to leave the home and doing so requires considerable and taxing effort. Other absences are for medical reasons / religious services and are infrequent or of short duration when for other reasons). o If current dressing causes regression in wound condition, may D/C ordered dressing product/s and apply Normal Saline Moist Dressing daily until next Wound Healing Center / Other MD appointment. Notify Wound Healing Center of regression in wound condition at [phone]. o Please direct any NON-WOUND related issues/requests for orders to patient's Primary Care Physician Medications-please add to medication list. Wound #1 Right,Proximal Lower Leg o P.O. Antibiotics - Ampicillin o Santyl Enzymatic Ointment Wound #2 Right,Distal Lower Leg o P.O. Antibiotics - Ampicillin o Santyl Enzymatic Ointment Wound #3 Left,Lateral Lower Leg o P.O. Antibiotics - Ampicillin o Santyl Enzymatic Ointment Electronic Signature(s) Signed: 03/30/2015 10:05:18 AM By: Elpidio Eric BSN,  RN Signed: 03/30/2015 4:21:04 PM By: Evlyn Kanner MD, FACS Previous Signature: 03/30/2015 9:53:11 AM Version By: Elpidio Eric BSN, RN Entered By: Elpidio Eric on 03/30/2015 10:05:17 Picinich, [name] ([id number]) -------------------------------------------------------------------------------- Problem List Details Patient Name: KENLEA, WOODELL  H. Date of Service: 03/30/2015 9:30 AM Medical Record Patient Account Number: [id number] Number: Afful, RN, BSN, Treating RN: 1956/03/12 (58 y.o. 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 Date of Service: 03/30/2015 9:30 AM Medical Record Patient Account Number: [id number] Number: Afful, RN, BSN, Treating RN: 07/20/1955 (58 y.o. [place] Sink Date of Birth/Sex: Female) Other Clinician: Primary Care Physician: Jonna Clark, JAMES Treating Evlyn Kanner Referring Physician: Fidel Levy Physician/Extender: Tania Ade in Treatment: 8 Subjective Chief Complaint Information obtained from Patient Patient presents to the wound care center for a consult due non healing wound. This  59 year old patient has had ulcerated areas on the left lower extremity more than the right lower extremity to very painful for the last 3 weeks. History of Present Illness (HPI) The following HPI elements were documented for the patient's wound: Location: Painful ulcerated areas left lower extremity more than right lower extremity Quality: Patient reports experiencing a sharp pain to affected area(s). Severity: Patient states wound are getting worse. Duration: Patient has had the wound for < 4 weeks prior to presenting for treatment Timing: Pain in wound is Intermittent (comes and goes Context: The wound appeared gradually over time Modifying Factors: Other treatment(s) tried include: she has had Keflex for about 10 days. Associated Signs and Symptoms: Patient reports having difficulty standing for long periods. 60 year old patient recently seen by her PCP Dr. Venora Maples who saw her on 01/25/2015 4 ulcers both lower extremities which have been present for about 3 weeks. She took a ten-day course of Keflex and continues to have leg pain and swelling. Past medical history significant for chronic kidney disease, hypertension and diabetes mellitus without complications. She has never been a smoker. Mostly recent CBC was within normal limits, BMP showed that her glucose was 136 and a beard was 45 and a creatinine is 4.19. The patient has had chronic renal failure and had a left AV fistula placed by Dr. Wyn Quaker in May and she is getting ready for hemodialysis soon. She has not been put on any specific medications for this problem. Addendum: tried to get Dr. 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 all questions answered. 02/07/2015 -- the patient started hemodialysis yesterday but was not successful and is going to be seeing Dr. Wyn Quaker  regain. She also has a vascular workup for early August. No further investigations or referrals have been made after my conversation with her PCPs office last week. I have asked the patient to call her PCPs office and discuss further investigations and referrals. 02/14/2015 -- she has had 2 sessions of hemodialysis via the right IJ dialysis catheter. She has not seen her PCP or Dr. Wyn Quaker yet. Part of a vascular  workup was done today and the next is sometime later in the month. Unfortunately she does not agree to any surgical intervention at this stage and I have not been able [facility], Kynnadi H. ([id number]) to refer her to a surgeon for debridement in the OR. 02/21/2015 -- she has not decided to proceed with surgery yet and is going to see her PCP tomorrow for more discussions. I have offered to talk to her specialists at any time and she does understand that. 03/07/15 -- she has come to see as back after 2 weeks and says that she can only come once in 2 weeks because of social issues. This time around she agrees to see Dr. dew and discuss surgical intervention and I will put in a call to him. Addendum: I got to speak to Dr. Festus Barren who kindly agreed to take her up to the OR for a debridement and punch biopsies. He will also work on her peripheral vascular workup to see if any angioplasty will help. 03/16/2015 cultures taken on 823 showed that she had a heavy growth of Stenotrophomonas maltophilia and enterococcus species. These were sensitive to levofloxacin, Septra, ampicillin. The patient is already on doxycycline. she was taken to the OR on 03/13/2015 for a angioplasty of her left arm AV fistula, which treated the cephalic vein near the subclavian vein confluence. The patient did not have any debridement of her left lower extremity and no biopsies were taken during the procedure done by Dr. Wyn Quaker. 03/22/2015 -- she has a pending appointment with Dr. Wyn Quaker in the next week and I have asked her  to address going to the operating room and getting an opinion regarding this debridement and possible vascular workup. 03/30/2015 -- today she informs me that her appointment with Dr. Wyn Quaker is not up to the end of October. Objective Constitutional Pulse regular. Respirations normal and unlabored. Afebrile. Vitals Time Taken: 9:29 AM, Height: 68 in, Weight: 294 lbs, BMI: 44.7, Temperature: 98.1 F, Pulse: 81 bpm, Respiratory Rate: 18 breaths/min, Blood Pressure: 125/58 mmHg. Eyes Nonicteric. Reactive to light. Ears, Nose, Mouth, and Throat Lips, teeth, and gums WNL.Marland Kitchen Moist mucosa without lesions . Neck supple and nontender. No palpable supraclavicular or cervical adenopathy. Normal sized without goiter. Respiratory WNL. No retractions.. Cardiovascular Pedal Pulses WNL. No clubbing, cyanosis or edema. [place], Fiona H. ([id number]) Lymphatic No adneopathy. No adenopathy. No adenopathy. Musculoskeletal Adexa without tenderness or enlargement.. Digits and nails w/o clubbing, cyanosis, infection, petechiae, ischemia, or inflammatory conditions.Marland Kitchen Psychiatric Judgement and insight Intact.. No evidence of depression, anxiety, or agitation.. General Notes: The left lower extremity has multiple areas with eschar and with slough and is very tender to touch. The right lower extremity has a couple of areas which are covered with eschar and have some subcutaneous debris. the patient does not have any evidence of pus pockets or cellulitis of the rest of the leg. Integumentary (Hair, Skin) No suspicious lesions. No crepitus or fluctuance. No peri-wound warmth or erythema. No masses.. Wound #1 status is Open. Original cause of wound was Gradually Appeared. The wound is located on the Right,Proximal Lower Leg. The wound measures 2.5cm length x 2.4cm width x 0.1cm depth; 4.712cm^2 area and 0.471cm^3 volume. The wound is limited to skin breakdown. There is no tunneling or undermining noted. There is  a small amount of serosanguineous drainage noted. The wound margin is distinct with the outline attached to the wound base. There is no granulation within the wound bed. There is a large (67- 100%)  amount of necrotic tissue within the wound bed including Eschar and Adherent Slough. The periwound skin appearance exhibited: Localized Edema, Dry/Scaly, Moist. The periwound skin appearance did not exhibit: Callus, Crepitus, Excoriation, Fluctuance, Friable, Induration, Rash, Scarring, Maceration, Atrophie Blanche, Cyanosis, Ecchymosis, Hemosiderin Staining, Mottled, Pallor, Rubor, Erythema. Periwound temperature was noted as No Abnormality. The periwound has tenderness on palpation. Wound #2 status is Open. Original cause of wound was Gradually Appeared. The wound is located on the Right,Distal Lower Leg. The wound measures 2.3cm length x 1.8cm width x 0.2cm depth; 3.252cm^2 area and 0.65cm^3 volume. The wound is limited to skin breakdown. There is no tunneling or undermining noted. There is a small amount of serosanguineous drainage noted. The wound margin is distinct with the outline attached to the wound base. There is no granulation within the wound bed. There is a large (67-100%) amount of necrotic tissue within the wound bed including Eschar and Adherent Slough. The periwound skin appearance exhibited: Localized Edema, Dry/Scaly, Moist. The periwound skin appearance did not exhibit: Callus, Crepitus, Excoriation, Fluctuance, Friable, Induration, Rash, Scarring, Maceration, Atrophie Blanche, Cyanosis, Ecchymosis, Hemosiderin Staining, Mottled, Pallor, Rubor, Erythema. Periwound temperature was noted as No Abnormality. The periwound has tenderness on palpation. Wound #3 status is Open. Original cause of wound was Gradually Appeared. The wound is located on the Left,Lateral Lower Leg. The wound measures 30cm length x 19cm width x 0.5cm depth; 447.677cm^2 area and 223.838cm^3 volume. The wound is  limited to skin breakdown. There is no tunneling or undermining noted. There is a medium amount of purulent drainage noted. The wound margin is flat and intact. There is no granulation within the wound bed. There is a large (67-100%) amount of necrotic tissue within the wound bed including Eschar and Adherent Slough. The periwound skin appearance exhibited: Localized Edema, Maceration, Moist. The periwound skin appearance did not exhibit: Callus, Crepitus, Excoriation, Fluctuance, Friable, Induration, Rash, Scarring, Dry/Scaly, Atrophie Blanche, Cyanosis, Ecchymosis, Hemosiderin Staining, Mottled, Pallor, Rubor, Erythema. Periwound temperature was noted as No Tootle, Olesya H. ([id number]) Abnormality. The periwound has tenderness on palpation. Wound #8 status is Open. Original cause of wound was Gradually Appeared. The wound is located on the Right,Posterior Lower Leg. The wound measures 1.5cm length x 1.5cm width x 0.1cm depth; 1.767cm^2 area and 0.177cm^3 volume. The wound is limited to skin breakdown. There is no tunneling or undermining noted. There is a small amount of drainage noted. The wound margin is distinct with the outline attached to the wound base. There is small (1-33%) pink granulation within the wound bed. There is a medium (34- 66%) amount of necrotic tissue within the wound bed including Eschar and Adherent Slough. The periwound skin appearance exhibited: Localized Edema, Moist. The periwound skin appearance did not exhibit: Callus, Crepitus, Excoriation, Fluctuance, Friable, Induration, Rash, Scarring, Dry/Scaly, Maceration, Atrophie Blanche, Cyanosis, Ecchymosis, Hemosiderin Staining, Mottled, Pallor, Rubor, Erythema. Periwound temperature was noted as No Abnormality. The periwound has tenderness on palpation. 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 I have been seeing her for over 8 weeks now and have spoken to her PCP and to her surgeon regarding getting this debrided and taking punch biopsies  in the operating room. She may also benefit from an arteriography and workup. I understand that the patient does not want to pursue a surgical debridement. She repeatedly has been told about a surgical opinion and need for procedure under anesthesia but she keeps avoiding the consultation and issue. I have discussed with her the risk benefits and possible complications of not getting this debrided in the OR and she fully understands that she can have septicemia and end up with severe complications which would require hospitalization and may lead to extensive surgical debridement, progression to amputation. For the present time I will continue with local care with Santyl ointment and appropriate dressing. I have urged her to keep an appointment with her surgeon and if he has any questions would be happy to talk to him. Plan Sallie, Donnika H. ([id number]) Wound Cleansing: Wound #1 Right,Proximal Lower Leg: Clean wound with Normal Saline. May Shower, gently pat wound dry prior to applying new dressing. Wound #2 Right,Distal Lower Leg: Clean wound with Normal Saline. May Shower, gently pat wound dry prior to applying new dressing. Wound #3 Left,Lateral Lower Leg: Clean wound with Normal Saline. May Shower, gently pat wound dry prior to applying new dressing. Wound #8 Right,Posterior Lower Leg: Clean wound with Normal Saline. May Shower, gently pat wound dry prior to applying new dressing. Anesthetic: Wound #1 Right,Proximal Lower Leg: Topical Lidocaine 4% cream  applied to wound bed prior to debridement Wound #2 Right,Distal Lower Leg: Topical Lidocaine 4% cream applied to wound bed prior to debridement Wound #3 Left,Lateral Lower Leg: Topical Lidocaine 4% cream applied to wound bed prior to debridement Wound #8 Right,Posterior Lower Leg: Topical Lidocaine 4% cream applied to wound bed prior to debridement Primary Wound Dressing: Wound #1 Right,Proximal Lower Leg: Santyl Ointment Wound #2 Right,Distal Lower Leg: Santyl Ointment Wound #3 Left,Lateral Lower Leg: Santyl Ointment Wound #8 Right,Posterior Lower Leg: Santyl Ointment Secondary Dressing: Wound #1 Right,Proximal Lower Leg: ABD and Kerlix/Conform Wound #3 Left,Lateral Lower Leg: ABD and Kerlix/Conform Wound #8 Right,Posterior Lower Leg: ABD and Kerlix/Conform Dressing Change Frequency: Wound #1 Right,Proximal Lower Leg: Change Dressing Monday, Wednesday, Friday Wound #2 Right,Distal Lower Leg: Change Dressing Monday, Wednesday, Friday Wound #3 Left,Lateral Lower Leg: Change Dressing Monday, Wednesday, Friday Wound #8 Right,Posterior Lower Leg: Change Dressing Monday, Wednesday, Friday Follow-up Appointments: Wound #1 Right,Proximal Lower Leg: Return Appointment in 2 weeks. [place], Ailany H. ([id number]) Wound #2 Right,Distal Lower Leg: Return Appointment in 2 weeks. Wound #3 Left,Lateral Lower Leg: Return Appointment in 2 weeks. Wound #8 Right,Posterior Lower Leg: Return Appointment in 2 weeks. Edema Control: Wound #1 Right,Proximal Lower Leg: Tubigrip Wound #2 Right,Distal Lower Leg: Tubigrip Wound #3 Left,Lateral Lower Leg: Tubigrip Wound #8 Right,Posterior Lower Leg: Tubigrip Home Health: Wound #1 Right,Proximal Lower Leg: Continue Home Health Visits - [facility] Nurse may visit PRN to address patient s wound care needs. FACE TO FACE ENCOUNTER: MEDICARE and MEDICAID PATIENTS: I certify that this patient is under my care and that I had a  face-to-face encounter that meets the physician face-to-face encounter requirements with this patient on this date. The encounter with the patient was in whole or in part for the following MEDICAL CONDITION: (primary reason for Home Healthcare) MEDICAL NECESSITY: I certify, that based on my findings, NURSING services are a medically necessary home health service. HOME BOUND STATUS: I certify that my clinical findings support that this patient is homebound (i.e., Due to illness or injury, pt requires aid of supportive devices such as crutches, cane,  wheelchairs, walkers, the use of special transportation or the assistance of another person to leave their place of residence. There is a normal inability to leave the home and doing so requires considerable and taxing effort. Other absences are for medical reasons / religious services and are infrequent or of short duration when for other reasons). If current dressing causes regression in wound condition, may D/C ordered dressing product/s and apply Normal Saline Moist Dressing daily until next Wound Healing Center / Other MD appointment. Notify Wound Healing Center of regression in wound condition at [phone]. Please direct any NON-WOUND related issues/requests for orders to patient's Primary Care Physician Wound #2 Right,Distal Lower Leg: Continue Home Health Visits - [facility] Nurse may visit PRN to address patient s wound care needs. FACE TO FACE ENCOUNTER: MEDICARE and MEDICAID PATIENTS: I certify that this patient is under my care and that I had a face-to-face encounter that meets the physician face-to-face encounter requirements with this patient on this date. The encounter with the patient was in whole or in part for the following MEDICAL CONDITION: (primary reason for Home Healthcare) MEDICAL NECESSITY: I certify, that based on my findings, NURSING services are a medically necessary home health service. HOME BOUND STATUS: I  certify that my clinical findings support that this patient is homebound (i.e., Due to illness or injury, pt requires aid of supportive devices such as crutches, cane, wheelchairs, walkers, the use of special transportation or the assistance of another person to leave their place of residence. There is a normal inability to leave the home and doing so requires considerable and taxing effort. Other absences are for medical reasons / religious services and are infrequent or of short duration when for other reasons). If current dressing causes regression in wound condition, may D/C ordered dressing product/s and apply Normal Saline Moist Dressing daily until next Wound Healing Center / Other MD appointment. Notify Wound Healing Center of regression in wound condition at [phone]. Please direct any NON-WOUND related issues/requests for orders to patient's Primary Care Physician Wound #3 Left,Lateral Lower Leg: VIRGINA, DEAKINS ([id number]) Continue Home Health Visits - [facility] Nurse may visit PRN to address patient s wound care needs. FACE TO FACE ENCOUNTER: MEDICARE and MEDICAID PATIENTS: I certify that this patient is under my care and that I had a face-to-face encounter that meets the physician face-to-face encounter requirements with this patient on this date. The encounter with the patient was in whole or in part for the following MEDICAL CONDITION: (primary reason for Home Healthcare) MEDICAL NECESSITY: I certify, that based on my findings, NURSING services are a medically necessary home health service. HOME BOUND STATUS: I certify that my clinical findings support that this patient is homebound (i.e., Due to illness or injury, pt requires aid of supportive devices such as crutches, cane, wheelchairs, walkers, the use of special transportation or the assistance of another person to leave their place of residence. There is a normal inability to leave the home and doing so  requires considerable and taxing effort. Other absences are for medical reasons / religious services and are infrequent or of short duration when for other reasons). If current dressing causes regression in wound condition, may D/C ordered dressing product/s and apply Normal Saline Moist Dressing daily until next Wound Healing Center / Other MD appointment. Notify Wound Healing Center of regression in wound condition at [phone]. Please direct any NON-WOUND related issues/requests for orders to patient's Primary Care Physician Medications-please add to medication list.:  Wound #1 Right,Proximal Lower Leg: P.O. Antibiotics - Ampicillin Santyl Enzymatic Ointment Wound #2 Right,Distal Lower Leg: P.O. Antibiotics - Ampicillin Santyl Enzymatic Ointment Wound #3 Left,Lateral Lower Leg: P.O. 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 I have been seeing her for over 8 weeks now and have spoken to her PCP and to her surgeon regarding getting this debrided and taking punch biopsies in the operating room. She may also benefit from an arteriography and workup. I understand that the patient does not want to pursue a surgical debridement. She repeatedly has been told about a surgical opinion and need for procedure under anesthesia but she keeps avoiding the consultation and issue. I have discussed with her the risk benefits and possible complications of not getting this debrided in the OR and she fully understands that she can have septicemia and end up with severe complications which would require hospitalization and may lead to extensive surgical debridement, progression to amputation. For the present time I will continue with local care with Santyl ointment and appropriate dressing. I have urged her to keep an appointment with her surgeon and if  he has any questions would be happy to talk to him. Electronic Signature(s) OMAR, ORREGO ([id number]) Signed: 03/30/2015 4:25:21 PM By: Evlyn Kanner MD, FACS Previous Signature: 03/30/2015 10:01:39 AM Version By: Evlyn Kanner MD, FACS Previous Signature: 03/30/2015 10:01:09 AM Version By: Evlyn Kanner MD, FACS Entered By: Evlyn Kanner on 03/30/2015 16:25:20 [place], [name] ([id number]) -------------------------------------------------------------------------------- SuperBill Details Patient Name: Kisling, Lasondra H. Date of Service: 03/30/2015 Medical Record Patient Account Number: [id number] Number: Afful, RN, BSN, Treating RN: [birth year] (58 y.o. [place] Date of Birth/Sex: Female) Other Clinician: Primary Care Physician: Jonna Clark, Rhona Leavens Referring Physician: Fidel Levy Physician/Extender: Tania Ade in Treatment: 8 Diagnosis Coding ICD-10 Codes Code Description E11.622 Type 2 diabetes mellitus with other skin ulcer N18.4 Chronic kidney disease, stage 4 (severe) L97.222 Non-pressure chronic ulcer of left calf with fat layer exposed L97.212 Non-pressure chronic ulcer of right calf with fat layer exposed Facility Procedures CPT4 Code: [id number] Description: [phone] - WOUND CARE VISIT-LEV 5 EST PT Modifier: Quantity: 1 Physician Procedures CPT4 Code: [id number] Description: 99213 - WC PHYS LEVEL 3 - EST PT ICD-10 Description Diagnosis E11.622 Type 2 diabetes mellitus with other skin ulcer N18.4 Chronic kidney disease, stage 4 (severe) L97.222 Non-pressure chronic ulcer of left calf with fat L97.212 Non-pressure  chronic ulcer of right calf with fat Modifier: layer exposed layer exposed Quantity: 1 Electronic Signature(s) Signed: 03/30/2015 5:38:57 PM By: Elpidio Eric BSN, RN Previous Signature: 03/30/2015 10:01:54 AM Version By: Evlyn Kanner MD, FACS Entered By: Elpidio Eric on 03/30/2015 17:38:56

## 2015-04-03 ENCOUNTER — Other Ambulatory Visit: Payer: Self-pay | Admitting: Family Medicine

## 2015-04-09 ENCOUNTER — Other Ambulatory Visit: Payer: Self-pay | Admitting: Family Medicine

## 2015-04-13 ENCOUNTER — Ambulatory Visit: Payer: BC Managed Care – PPO | Admitting: Surgery

## 2015-04-19 ENCOUNTER — Other Ambulatory Visit: Payer: Self-pay | Admitting: Family Medicine

## 2015-04-19 ENCOUNTER — Encounter: Payer: BC Managed Care – PPO | Attending: Surgery | Admitting: Surgery

## 2015-04-19 ENCOUNTER — Other Ambulatory Visit
Admission: RE | Admit: 2015-04-19 | Discharge: 2015-04-19 | Disposition: A | Discharge status: Home / Self Care | Payer: BC Managed Care – PPO | Source: Ambulatory Visit | Attending: Surgery | Admitting: Surgery

## 2015-04-19 DIAGNOSIS — L089 Local infection of the skin and subcutaneous tissue, unspecified: Secondary | ICD-10-CM | POA: Insufficient documentation

## 2015-04-19 DIAGNOSIS — R600 Localized edema: Secondary | ICD-10-CM | POA: Insufficient documentation

## 2015-04-19 DIAGNOSIS — N186 End stage renal disease: Secondary | ICD-10-CM | POA: Diagnosis not present

## 2015-04-19 DIAGNOSIS — E11622 Type 2 diabetes mellitus with other skin ulcer: Secondary | ICD-10-CM | POA: Insufficient documentation

## 2015-04-19 DIAGNOSIS — L97222 Non-pressure chronic ulcer of left calf with fat layer exposed: Secondary | ICD-10-CM | POA: Diagnosis not present

## 2015-04-19 DIAGNOSIS — S81802A Unspecified open wound, left lower leg, initial encounter: Secondary | ICD-10-CM | POA: Diagnosis not present

## 2015-04-19 DIAGNOSIS — L97212 Non-pressure chronic ulcer of right calf with fat layer exposed: Secondary | ICD-10-CM | POA: Diagnosis not present

## 2015-04-19 NOTE — Progress Notes (Addendum)
[name], [name] ([id number]) Visit Report for 04/19/2015 Arrival Information Details Patient Name: [name], [name]. Date of Service: 04/19/2015 8:00 AM Medical Record Patient Account Number: [id number] Number: Treating [name]: [name] 1956-02-28 [phone] y.o. Other Clinician: Date of Birth/Sex: Female) Treating BURNS III, Primary Care Physician: [name] Physician/Extender: [name] Referring Physician: Jones [name] in Treatment: 11 Visit Information History Since Last Visit Any new allergies or adverse reactions: No Patient Arrived: Ambulatory Signs or symptoms of abuse/neglect since last No Arrival Time: 08:08 visito Accompanied By: self Hospitalized since last visit: No Transfer Assistance: None Has Dressing in Place as Prescribed: Yes Patient Identification Verified: Yes Pain Present Now: No Secondary Verification Process Yes Completed: Patient Has Alerts: Yes Patient Alerts: DMII ABI [place] Bilateral Electronic [name](s) Signed: 04/19/2015 1:44:01 PM By: [name] Previous [name]: 04/19/2015 5:09:59 AM Version By: [name] Entered By: [name] on 04/19/2015 13:44:00 [name] ([id number]) -------------------------------------------------------------------------------- Encounter Discharge Information Details Patient Name: [name], [name] Date of Service: 04/19/2015 8:00 AM Medical Record Patient Account Number: [id number] Number: Treating [name]: [name] Nov 14, 1955 [phone] y.o. Other Clinician: Date of Birth/Sex: Female) Treating BURNS III, Primary Care Physician: [name] Physician/Extender: [name] Referring Physician: Jones [name] in Treatment: 11 Encounter Discharge Information Items Discharge Pain Level: 0 Discharge Condition: Stable Ambulatory Status: Ambulatory Discharge Destination: Home Transportation: Private Auto Accompanied By:  self Schedule Follow-up Appointment: No Medication Reconciliation completed and provided to Patient/Care No [name]: Provided on Clinical Summary of Care: 04/19/2015 Form Type Recipient Paper Patient PM Electronic [name](s) Signed: 04/19/2015 6:12:38 AM By: [name]: 04/19/2015 6:11:18 AM Version By: [name] Entered By: [name] on 04/19/2015 09:12:38 [name] ([id number]) -------------------------------------------------------------------------------- Lower Extremity Assessment Details Patient Name: [name], [name] Date of Service: 04/19/2015 8:00 AM Medical Record Patient Account Number: [id number] Number: Treating [name]: [name] 17-Sep-1955 [phone] y.o. Other Clinician: Date of Birth/Sex: Female) Treating BURNS III, Primary Care Physician: [name] Physician/Extender: [name] Referring Physician: Jones [name] in Treatment: 11 Edema Assessment Assessed: [Left: No] [Right: No] E[Left: dema] [Right: :] Calf Left: Right: Point of Measurement: 34 cm From Medial Instep 48.6 cm 45.6 cm Ankle Left: Right: Point of Measurement: 10 cm From Medial Instep 26.4 cm 26.2 cm Vascular Assessment Claudication: Claudication Assessment [Left:None] [Right:None] Pulses: Posterior Tibial Dorsalis Pedis Palpable: [Left:Yes] [Right:Yes] Extremity colors, hair growth, and conditions: Extremity Color: [Left:Mottled] [Right:Mottled] Hair Growth on Extremity: [Left:No] [Right:No] Temperature of Extremity: [Left:Warm] [Right:Warm] Capillary Refill: [Left:< 3 seconds] [Right:< 3 seconds] Toe Nail Assessment Left: Right: Thick: No No Discolored: No No Deformed: No No Improper Length and Hygiene: No No Electronic [name](s) Signed: 04/19/2015 5:15:37 AM By: [name] [name] Kitchen ([id number]) Entered By: [name] on 04/19/2015 08:15:37 [name], [name]  ([id number]) -------------------------------------------------------------------------------- Multi Wound Chart Details Patient Name: [name], [name] Date of Service: 04/19/2015 8:00 AM Medical Record Patient Account Number: [id number] Number: Treating [name]: [name] [birth year] [phone] y.o. 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] [2:Calciphylaxis] [3:Calciphylaxis] Comorbid History: [1:Hypertension, Type II Diabetes] [2:Hypertension, Type II Diabetes] [3:Hypertension, Type II Diabetes] Date Acquired: [1:01/09/2015] [2:01/09/2015] [3:01/09/2015] Weeks of Treatment: [1:11] [2:11] [3:11] Wound Status: [1:Open] [2:Open] [3:Open] Measurements L x W x D 2.5x2.4x0.2 [2:2.4x2x0.2] [3:26x18x0.5] (cm) Area (cm) : [1:4.712] [2:3.77] [3:367.566] Volume (cm) : [1:0.942] [2:0.754] [3:183.783] % Reduction in Area: [1:-9925.50%] [2:-3910.60%] [3:-3242.70%] % Reduction in Volume: -18740.00% [2:-8277.80%] [3:-16607.50%] Classification: [1:Full Thickness Without Exposed Support Structures] [2:Full Thickness Without Exposed Support Structures] [3:Full Thickness Without Exposed Support Structures] HBO Classification: [1:Grade 1] [2:Grade 1] [3:Grade 2] Exudate Amount: [1:Small] [2:Small] [3:Medium] Exudate Type: [1:Serosanguineous] [2:Serosanguineous] [3:Purulent] Exudate Color:  [1:red, brown] [2:red, brown] [3:yellow, brown, green] Foul Odor After [1:No] [2:No] [3:Yes] Cleansing: Odor Anticipated Due to N/A [2:N/A] [3:No] Product Use: Wound Margin: [1:Distinct, outline attached] [2:Distinct, outline attached Flat and Intact] Granulation Amount: None Present (0%) None Present (0%) None Present (0%) Granulation Quality: N/A N/A N/A Necrotic Amount: Large (67-100%) Large (67-100%) Large (67-100%) Necrotic Tissue: Eschar, Adherent Slough Eschar, Adherent Slough Eschar, Adherent Slough Exposed Structures: Fascia: No Fascia: No Fascia: No Fat: No Fat: No Fat: No Tendon: No Tendon: No Tendon: No Muscle: No Muscle: No Muscle: No Joint: No Joint: No Joint: No Bone: No Bone: No Bone: No Limited to Skin Limited to Skin Limited to Skin Breakdown Breakdown Breakdown Epithelialization: None None Small (1-33%) Debridement: Debridement (16109- Debridement (60454- Debridement (11042- 11047) 11047) 11047) Time-Out Taken: Yes Yes Yes Pain Control: Lidocaine 4% Topical Lidocaine 4% Topical Lidocaine 4% Topical Solution Solution Solution Tissue Debrided: Necrotic/Eschar, Necrotic/Eschar, Necrotic/Eschar, Fibrin/Slough, Exudates, Fibrin/Slough, Exudates, Fibrin/Slough, Exudates, Subcutaneous Subcutaneous Subcutaneous Level: Skin/Subcutaneous Skin/Subcutaneous Skin/Subcutaneous Tissue Tissue Tissue Debridement Area (sq 6 4.8 468 cm): Instrument: Curette, Forceps, Scissors Curette, Forceps, Scissors Curette, Forceps, Scissors Bleeding: Minimum Minimum Minimum Hemostasis Achieved: Pressure Pressure Pressure Procedural Pain: 0 0 0 Post Procedural Pain: 0 0 0 Debridement Treatment Procedure was tolerated Procedure was tolerated Procedure was tolerated Response: well well well Post Debridement 2.5x2.4x0.2 2.4x2x0.2 26x18x0.5 Measurements L x W x D (cm) Post Debridement 0.942 0.754 183.783 Volume: (cm) Periwound Skin Texture: Edema: Yes Edema: Yes Edema:  Yes Excoriation: No Excoriation: No Excoriation: No Induration: No Induration: No Induration: No Callus: No Callus: No Callus: No Crepitus: No Crepitus: No Crepitus: No Fluctuance: No Fluctuance: No Fluctuance: No Friable: No Friable: No Friable: No Rash: No Rash: No Rash: No Scarring: No Scarring: No Scarring: No Periwound Skin Moist: Yes Moist: Yes Maceration: Yes Moisture: Dry/Scaly: Yes Dry/Scaly: Yes Moist: Yes Maceration: No Maceration: No Dry/Scaly: No Periwound Skin Color: Karges, Vonna H. ([id number]) Atrophie Blanche: No Atrophie Blanche: No Atrophie Blanche: No Cyanosis: No Cyanosis: No Cyanosis: No Ecchymosis: No Ecchymosis: No Ecchymosis: No Erythema: No Erythema: No Erythema: No Hemosiderin Staining: No Hemosiderin Staining: No Hemosiderin Staining: No Mottled: No Mottled: No Mottled: No Pallor: No Pallor: No Pallor: No Rubor: No Rubor: No Rubor: No Temperature: No Abnormality No Abnormality No Abnormality Tenderness on Yes Yes Yes Palpation: Wound Preparation: Ulcer Cleansing: Ulcer Cleansing: Ulcer Cleansing: Rinsed/Irrigated with Rinsed/Irrigated with Rinsed/Irrigated with Saline Saline Saline Topical Anesthetic Topical Anesthetic Topical Anesthetic Applied: Other: lidocaine Applied: Other: lidocaine Applied: Other: lidocaine 4% 4% 4% Procedures Performed: Debridement Debridement Debridement Wound Number: 8 N/A N/A Photos: No Photos N/A N/A Wound Location: Right Lower Leg - N/A N/A Posterior Wounding Event: Gradually Appeared N/A N/A Primary Etiology: Calciphylaxis N/A N/A Comorbid History: Hypertension, Type II N/A N/A Diabetes Date Acquired:  03/27/2015 N/A N/A Weeks of Treatment: 2 N/A N/A Wound Status: Open N/A N/A Measurements L x W x D 6x7x0.1 N/A N/A (cm) Area (cm) : 32.987 N/A N/A Volume (cm) : 3.299 N/A N/A % Reduction in Area: -1766.80% N/A N/A % Reduction in Volume: -1763.80% N/A  N/A Classification: Partial Thickness N/A N/A HBO Classification: Grade 1 N/A N/A Exudate Amount: Small N/A N/A Exudate Type: N/A N/A N/A Exudate Color: N/A N/A N/A Foul Odor After No N/A N/A Cleansing: Odor Anticipated Due to N/A N/A N/A Product Use: Wound Margin: Distinct, outline attached N/A N/A Granulation Amount: Small (1-33%) N/A N/A Granulation Quality: Pink N/A N/A Juba, Marlaina H. ([id number]) Necrotic Amount: Medium (34-66%) N/A N/A Necrotic Tissue: Eschar, Adherent Slough N/A N/A Exposed Structures: Fascia: No N/A N/A Fat: No Tendon: No Muscle: No Joint: No Bone: No Limited to Skin Breakdown Epithelialization: None N/A N/A Debridement: N/A N/A N/A Time-Out Taken: N/A N/A N/A Pain Control: N/A N/A N/A Tissue Debrided: N/A N/A N/A Level: N/A N/A N/A Debridement Area (sq N/A N/A N/A cm): Instrument: N/A N/A N/A Bleeding: N/A N/A N/A Hemostasis Achieved: N/A N/A N/A Procedural Pain: N/A N/A N/A Post Procedural Pain: N/A N/A N/A Debridement Treatment N/A N/A N/A Response: Post Debridement N/A N/A N/A Measurements L x W x D (cm) Post Debridement N/A N/A N/A Volume: (cm) Periwound Skin Texture: Edema: Yes N/A N/A Excoriation: No Induration: No Callus: No Crepitus: No Fluctuance: No Friable: No Rash: No Scarring: No Periwound Skin Moist: Yes N/A N/A Moisture: Maceration: No Dry/Scaly: No Periwound Skin Color: Atrophie Blanche: No N/A N/A Cyanosis: No Ecchymosis: No Erythema: No Hemosiderin Staining: No Mottled: No Pallor: No Rubor: No Sosnowski, Aleisha H. ([id number]) Temperature: No Abnormality N/A N/A Tenderness on Yes N/A N/A Palpation: Wound Preparation: Ulcer Cleansing: N/A N/A Rinsed/Irrigated with Saline Topical Anesthetic Applied: Other: lidocaine4% Procedures Performed: N/A N/A N/A Treatment Notes Electronic [name](s) Signed: 04/19/2015 8:54:07 AM By: [name] Entered By: [name] on 04/19/2015  08:54:07 Filippini, [name] ([id number]) -------------------------------------------------------------------------------- Multi-Disciplinary Care Plan Details Patient Name: [name], [name] Date of Service: 04/19/2015 8:00 AM Medical Record Patient Account Number: [id number] Number: Treating [name]: [name] 1955-10-04 [phone] y.o. Other Clinician: Date of Birth/Sex: Female) Treating BURNS III, Primary Care Physician: [name] Physician/Extender: [name] Referring Physician: Jones [name] in Treatment: 11 Active Inactive Orientation to the Wound Care Program Nursing Diagnoses: Knowledge deficit related to the wound healing center program Goals: Patient/caregiver will verbalize understanding of the Wound Healing Center Program Date Initiated: 01/30/2015 Goal Status: Active Interventions: Provide education on orientation to the wound center Notes: Pain, Acute or Chronic Nursing Diagnoses: Pain, acute or chronic: actual or potential Goals: Patient will verbalize adequate pain control and receive pain control interventions during procedures as needed Date Initiated: 01/30/2015 Goal Status: Active Patient/caregiver will verbalize adequate pain control between visits Date Initiated: 01/30/2015 Goal Status: Active Interventions: Assess comfort goal upon admission Encourage patient to take pain medications as prescribed Notes: Venous Leg Ulcer [name], [name] ([id number]) Nursing Diagnoses: Potential for venous Insuffiency (use before diagnosis confirmed) Goals: Non-invasive venous studies are completed as ordered Date Initiated: 01/30/2015 Goal Status: Active Interventions: Assess peripheral edema status every visit. Notes: Wound/Skin Impairment Nursing Diagnoses: Impaired tissue integrity Goals: Ulcer/skin breakdown will have a volume reduction of 30% by week 4 Date Initiated: 01/30/2015 Goal Status: Active Ulcer/skin breakdown  will have a volume reduction of 50% by week 8 Date Initiated: 01/30/2015 Goal Status: Active  Ulcer/skin breakdown will have a volume reduction of 80% by week 12 Date Initiated: 01/30/2015 Goal Status: Active Ulcer/skin breakdown will heal within 14 weeks Date Initiated: 01/30/2015 Goal Status: Active Interventions: Assess ulceration(s) every visit Notes: Electronic [name](s) Signed: 04/19/2015 8:46:49 AM By: [name] Entered By: [name] on 04/19/2015 08:46:49 Vernet, Zaley H. ([id number]) -------------------------------------------------------------------------------- Pain Assessment Details Patient Name: [name], [name] Date of Service: 04/19/2015 8:00 AM Medical Record Patient Account Number: [id number] Number: Treating [name]: [name] 1956/02/06 [phone] y.o. Other Clinician: Date of Birth/Sex: Female) Treating BURNS III, Primary Care Physician: [name] Physician/Extender: [name] Referring Physician: Jones [name] in Treatment: 11 Active Problems Location of Pain Severity and Description of Pain Patient Has Paino No Site Locations Pain Management and Medication Current Pain Management: Electronic [name](s) Signed: 04/19/2015 5:11:37 AM By: [name] Entered By: [name] on 04/19/2015 08:11:37 Medeiros, [name] ([id number]) -------------------------------------------------------------------------------- Patient/Caregiver Education Details Patient Name: Nemetz, Lizett H. Date of Service: 04/19/2015 8:00 AM Medical Record Patient Account Number: [id number] Number: Treating [name]: [name] 07-Sep-1955 [id number] y.o. Other Clinician: Date of Birth/Gender: Female) Treating BURNS III, Primary Care Physician: [name] Physician/Extender: [name] Referring Physician: Jones [name] in Treatment: 11 Education Assessment Education Provided To: Patient Education Topics  Provided Basic Hygiene: Methods: Explain/Verbal Responses: State content correctly Welcome To [facility]: Methods: Explain/Verbal Responses: State content correctly Electronic [name](s) Signed: 04/19/2015 9:11:32 AM By: [name] Entered By: [name] on 04/19/2015 09:11:32 Oesterle, Khayla Rexene [name] ([id number]) -------------------------------------------------------------------------------- Wound Assessment Details Patient Name: Lebon, Meila H. Date of Service: 04/19/2015 8:00 AM Medical Record Patient Account Number: [id number] Number: Treating [name]: [name] 25-Dec-1955 [phone] y.o. Other Clinician: Date of Birth/Sex: Female) Treating BURNS III, Primary Care Physician: [name] Physician/Extender: [name] Referring Physician: Jones [name] in Treatment: 11 Wound Status Wound Number: 1 Primary Etiology: Calciphylaxis Wound Location: Right Lower Leg - Proximal Wound Status: Open Wounding Event: Gradually Appeared Comorbid History: Hypertension, Type II Diabetes Date Acquired: 01/09/2015 Weeks Of Treatment: 11 Clustered Wound: No Photos Photo Uploaded By: [name] on 04/19/2015 16:01:48 Wound Measurements Length: (cm) 2.5 Width: (cm) 2.4 Depth: (cm) 0.2 Area: (cm) 4.712 Volume: (cm) 0.942 % Reduction in Area: -9925.5% % Reduction in Volume: -18740% Epithelialization: None Tunneling: No Undermining: No Wound Description Full Thickness Without Exposed Foul Odor Classification: Support Structures Diabetic Severity Grade 1 (Wagner): Wound Margin: Distinct, outline attached Exudate Amount: Small Exudate Type: Serosanguineous Exudate Color: red, brown After Cleansing: No Wound Bed Seddon, Keiri H. ([id number]) Granulation Amount: None Present (0%) Exposed Structure Necrotic Amount: Large (67-100%) Fascia Exposed: No Necrotic Quality: Eschar, Adherent Slough Fat Layer Exposed: No Tendon Exposed:  No Muscle Exposed: No Joint Exposed: No Bone Exposed: No Limited to Skin Breakdown Periwound Skin Texture Texture Color No Abnormalities Noted: No No Abnormalities Noted: No Callus: No Atrophie Blanche: No Crepitus: No Cyanosis: No Excoriation: No Ecchymosis: No Fluctuance: No Erythema: No Friable: No Hemosiderin Staining: No Induration: No Mottled: No Localized Edema: Yes Pallor: No Rash: No Rubor: No Scarring: No Temperature / Pain Moisture Temperature: No Abnormality No Abnormalities Noted: No Tenderness on Palpation: Yes Dry / Scaly: Yes Maceration: No Moist: Yes Wound Preparation Ulcer Cleansing: Rinsed/Irrigated with Saline Topical Anesthetic Applied: Other: lidocaine 4%, Treatment Notes Wound #1 (Right, Proximal Lower Leg) 1. Cleansed with: Clean wound with Normal Saline 2. Anesthetic Topical Lidocaine 4% cream to wound  bed prior to debridement 4. Dressing Applied: Santyl Ointment 5. Secondary Dressing Applied ABD and Kerlix/Conform 7. Secured with [employer] Electronic [name](s) Signed: 04/19/2015 5:33:13 AM By: [name] [name], [name] Rexene [name] ([id number]) Entered By: [name] on 04/19/2015 08:33:12 Bloch, [name] ([id number]) -------------------------------------------------------------------------------- Wound Assessment Details Patient Name: [name], [name] Date of Service: 04/19/2015 8:00 AM Medical Record Patient Account Number: [id number] Number: Treating [name]: [name] 23-Sep-1955 [phone] y.o. Other Clinician: Date of Birth/Sex: Female) Treating BURNS III, Primary Care Physician: [name] Physician/Extender: [name] Referring Physician: Jones [name] in Treatment: 11 Wound Status Wound Number: 2 Primary Etiology: Calciphylaxis Wound Location: Right Lower Leg - Distal Wound Status: Open Wounding Event: Gradually Appeared Comorbid History: Hypertension, Type II Diabetes Date  Acquired: 01/09/2015 Weeks Of Treatment: 11 Clustered Wound: No Photos Photo Uploaded By: [name] on 04/19/2015 15:59:50 Wound Measurements Length: (cm) 2.4 Width: (cm) 2 Depth: (cm) 0.2 Area: (cm) 3.77 Volume: (cm) 0.754 % Reduction in Area: -3910.6% % Reduction in Volume: -8277.8% Epithelialization: None Tunneling: No Undermining: No Wound Description Full Thickness Without Exposed Foul Odor Classification: Support Structures Diabetic Severity Grade 1 (Wagner): Wound Margin: Distinct, outline attached Exudate Amount: Small Exudate Type: Serosanguineous Exudate Color: red, brown After Cleansing: No Wound Bed Rada, Baylee H. ([id number]) Granulation Amount: None Present (0%) Exposed Structure Necrotic Amount: Large (67-100%) Fascia Exposed: No Necrotic Quality: Eschar, Adherent Slough Fat Layer Exposed: No Tendon Exposed: No Muscle Exposed: No Joint Exposed: No Bone Exposed: No Limited to Skin Breakdown Periwound Skin Texture Texture Color No Abnormalities Noted: No No Abnormalities Noted: No Callus: No Atrophie Blanche: No Crepitus: No Cyanosis: No Excoriation: No Ecchymosis: No Fluctuance: No Erythema: No Friable: No Hemosiderin Staining: No Induration: No Mottled: No Localized Edema: Yes Pallor: No Rash: No Rubor: No Scarring: No Temperature / Pain Moisture Temperature: No Abnormality No Abnormalities Noted: No Tenderness on Palpation: Yes Dry / Scaly: Yes Maceration: No Moist: Yes Wound Preparation Ulcer Cleansing: Rinsed/Irrigated with Saline Topical Anesthetic Applied: Other: lidocaine 4%, Treatment Notes Wound #2 (Right, Distal Lower Leg) 1. Cleansed with: Clean wound with Normal Saline 2. Anesthetic Topical Lidocaine 4% cream to wound bed prior to debridement 4. Dressing Applied: Santyl Ointment 5. Secondary Dressing Applied ABD and Kerlix/Conform 7. Secured with [employer] Electronic [name](s) Signed: 04/19/2015  8:37:12 AM By: [name] Emond, [place] Rexene [name] ([id number]) Entered By: [name] on 04/19/2015 08:37:12 Rebert, [name] ([id number]) -------------------------------------------------------------------------------- Wound Assessment Details Patient Name: Fissel, Jeris H. Date of Service: 04/19/2015 8:00 AM Medical Record Patient Account Number: [id number] Number: Treating [name]: [name] [birth year] [phone] y.o. Other Clinician: Date of Birth/Sex: Female) Treating BURNS III, Primary Care Physician: [name] Physician/Extender: [name] Referring Physician: Jones [name] in Treatment: 11 Wound Status Wound Number: 3 Primary Etiology: Calciphylaxis Wound Location: Left Lower Leg - Lateral Wound Status: Open Wounding Event: Gradually Appeared Comorbid History: Hypertension, Type II Diabetes Date Acquired: 01/09/2015 Weeks Of Treatment: 11 Clustered Wound: No Photos Photo Uploaded By: [name] on 04/19/2015 15:59:50 Wound Measurements Length: (cm) 26 Width: (cm) 18 Depth: (cm) 0.5 Area: (cm) 367.566 Volume: (cm) 183.783 % Reduction in Area: -3242.7% % Reduction in Volume: -16607.5% Epithelialization: Small (1-33%) Tunneling: No Undermining: No Wound Description Full Thickness Without Classification: Exposed Support Structures Diabetic Severity Grade 2 (Wagner): Wound Margin: Flat and Intact Exudate Amount: Medium Exudate Type: Purulent Exudate Color: yellow, brown, green Foul Odor After Cleansing: Yes Due to Product Use: No  Wound Bed Coote, Ileanna H. ([id number]) Granulation Amount: None Present (0%) Exposed Structure Necrotic Amount: Large (67-100%) Fascia Exposed: No Necrotic Quality: Eschar, Adherent Slough Fat Layer Exposed: No Tendon Exposed: No Muscle Exposed: No Joint Exposed: No Bone Exposed: No Limited to Skin Breakdown Periwound Skin Texture Texture Color No Abnormalities Noted: No No  Abnormalities Noted: No Callus: No Atrophie Blanche: No Crepitus: No Cyanosis: No Excoriation: No Ecchymosis: No Fluctuance: No Erythema: No Friable: No Hemosiderin Staining: No Induration: No Mottled: No Localized Edema: Yes Pallor: No Rash: No Rubor: No Scarring: No Temperature / Pain Moisture Temperature: No Abnormality No Abnormalities Noted: No Tenderness on Palpation: Yes Dry / Scaly: No Maceration: Yes Moist: Yes Wound Preparation Ulcer Cleansing: Rinsed/Irrigated with Saline Topical Anesthetic Applied: Other: lidocaine 4%, Treatment Notes Wound #3 (Left, Lateral Lower Leg) 1. Cleansed with: Clean wound with Normal Saline 2. Anesthetic Topical Lidocaine 4% cream to wound bed prior to debridement 4. Dressing Applied: Santyl Ointment 5. Secondary Dressing Applied ABD and Kerlix/Conform 7. Secured with [employer] aid/development worker) Signed: 04/19/2015 5:37:43 AM By: [name] Parlett, Tanya Rexene [name] ([id number]) Entered By: [name] on 04/19/2015 08:37:42 Bradwell, [name] ([id number]) -------------------------------------------------------------------------------- Wound Assessment Details Patient Name: Landstrom, Denijah H. Date of Service: 04/19/2015 8:00 AM Medical Record Patient Account Number: [id number] Number: Treating [name]: [name] June 27, 1956 [phone] y.o. 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 Status: Open Wounding Event: Gradually Appeared Comorbid History: Hypertension, Type II Diabetes Date Acquired: 03/27/2015 Weeks Of Treatment: 2 Clustered Wound: No Photos Photo Uploaded By: [name] on 04/19/2015 15:59:51 Wound Measurements Length: (cm)  6 Width: (cm) 7 Depth: (cm) 0.1 Area: (cm) 32.987 Volume: (cm) 3.299 % Reduction in Area: -1766.8% % Reduction in Volume: -1763.8% Epithelialization: None Tunneling: No Undermining: No Wound Description Classification: Partial Thickness Foul O Diabetic Severity (Wagner): Grade 1 Wound Margin: Distinct, outline attached Exudate Amount: Small dor After Cleansing: No Wound Bed Granulation Amount: Small (1-33%) Exposed Structure Granulation Quality: Pink Fascia Exposed: No Necrotic Amount: Medium (34-66%) Fat Layer Exposed: No Necrotic Quality: Eschar, Adherent Slough Tendon Exposed: No Follette, Kamoria H. ([id number]) Muscle Exposed: No Joint Exposed: No Bone Exposed: No Limited to Skin Breakdown Periwound Skin Texture Texture Color No Abnormalities Noted: No No Abnormalities Noted: No Callus: No Atrophie Blanche: No Crepitus: No Cyanosis: No Excoriation: No Ecchymosis: No Fluctuance: No Erythema: No Friable: No Hemosiderin Staining: No Induration: No Mottled: No Localized Edema: Yes Pallor: No Rash: No Rubor: No Scarring: No Temperature / Pain Moisture Temperature: No Abnormality No Abnormalities Noted: No Tenderness on Palpation: Yes Dry / Scaly: No Maceration: No Moist: Yes Wound Preparation Ulcer Cleansing: Rinsed/Irrigated with Saline Topical Anesthetic Applied: Other: lidocaine4%, Treatment Notes Wound #8 (Right, Posterior Lower Leg) 1. Cleansed with: Clean wound with Normal Saline 2. Anesthetic Topical Lidocaine 4% cream to wound bed prior to debridement 4. Dressing Applied: Santyl Ointment 5. Secondary Dressing Applied ABD and Kerlix/Conform 7. Secured with International aid/development worker) Signed: 04/19/2015 5:38:28 AM By: [name] Entered By: [name] on 04/19/2015 08:38:28 [place], [name] ([id number]) -------------------------------------------------------------------------------- Vitals Details Patient Name:  War[place]ton, Xylah H. Date of Service: 04/19/2015 8:00 AM Medical Record Patient Account Number: [id number] Number: Treating [name]: [name] 29-Aug-1955 [phone] y.o. Other Clinician: Date of Birth/Sex:  Female) Treating BURNS III, Primary Care Physician: [name] Physician/Extender: [name] Referring Physician: Jones [name] in Treatment: 11 Vital Signs Time Taken: 08:11 Temperature (F): 98.1 Height (in): 68 Pulse (bpm): 96 Weight (lbs): 294 Respiratory Rate (breaths/min): 18 Body Mass Index (BMI): 44.7 Blood Pressure (mmHg): 141/79 Reference Range: 80 - 120 mg / dl Electronic [name](s) Signed: 04/19/2015 5:12:07 AM By: [name] Entered By: [name] on 04/19/2015 08:12:07

## 2015-04-20 NOTE — Progress Notes (Signed)
JADE, BURKARD ([id number]) Visit Report for 04/19/2015 Chief Complaint Document Details Patient Name: [name], [name]. Date of Service: 04/19/2015 8:00 AM Medical Record Patient Account Number: [id number] Number: Treating RN: Afful, RN, BSN, Rita 05/07/1956 [phone] y.o. Other Clinician: Date of Birth/Sex: Female) Treating BURNS III, Primary Care Physician/Extender: Karn Cassis Physician: Referring Physician: Jones Broom in Treatment: 11 Information Obtained from: Patient Chief Complaint Bilateral calf ulcers. Electronic Signature(s) Signed: 04/19/2015 4:13:26 PM By: Madelaine Bhat MD Entered By: Madelaine Bhat on 04/19/2015 09:19:00 Christner, [name] ([id number]) -------------------------------------------------------------------------------- Debridement Details Patient Name: [name], [name] Date of Service: 04/19/2015 8:00 AM Medical Record Patient Account Number: [id number] Number: Treating RN: Afful, RN, BSN, Rita 1955/11/08 [phone] y.o. Other Clinician: Date of Birth/Sex: Female) Treating BURNS III, Primary Care Physician/Extender: Karn Cassis Physician: Referring Physician: Jones Broom in Treatment: 11 Debridement Performed for Wound #1 Right,Proximal Lower Leg Assessment: Performed By: Physician BURNS III, Melanie Crazier., MD Debridement: Debridement Pre-procedure Yes Verification/Time Out Taken: Start Time: 08:45 Pain Control: Lidocaine 4% Topical Solution Level: Skin/Subcutaneous Tissue Total Area Debrided (L x 2.5 (cm) x 2.4 (cm) = 6 (cm) W): Tissue and other Non-Viable, Eschar, Exudate, Fat, Fibrin/Slough, Subcutaneous material debrided: Instrument: Curette, Forceps, Scissors Bleeding: Minimum Hemostasis Achieved: Pressure End Time: 08:49 Procedural Pain: 0 Post Procedural Pain: 0 Response to Treatment: Procedure was tolerated well Post Debridement Measurements of Total  Wound Length: (cm) 2.5 Width: (cm) 2.4 Depth: (cm) 0.3 Volume: (cm) 1.414 Post Procedure Diagnosis Same as Pre-procedure Electronic Signature(s) Signed: 04/19/2015 4:05:44 PM By: Elpidio Eric BSN, RN Signed: 04/19/2015 4:13:26 PM By: Madelaine Bhat MD Previous Signature: 04/19/2015 5:50:34 AM Version By: Elpidio Eric BSN, RN Entered By: Madelaine Bhat on 04/19/2015 09:17:30 Spinello, Hinda H. ([id number]) Garin, Marya H. ([id number]) -------------------------------------------------------------------------------- Debridement Details Patient Name: [name], [name] Date of Service: 04/19/2015 8:00 AM Medical Record Patient Account Number: [id number] Number: Treating RN: Afful, RN, BSN, Rita [birth year] [phone] y.o. Other Clinician: Date of Birth/Sex: Female) Treating BURNS III, Primary Care Physician/Extender: Karn Cassis Physician: Referring Physician: Jones Broom in Treatment: 11 Debridement Performed for Wound #2 Right,Distal Lower Leg Assessment: Performed By: Physician BURNS III, Melanie Crazier., MD Debridement: Debridement Pre-procedure Yes Verification/Time Out Taken: Start Time: 08:49 Pain Control: Lidocaine 4% Topical Solution Level: Skin/Subcutaneous Tissue Total Area Debrided (L x 2.4 (cm) x 2 (cm) = 4.8 (cm) W): Tissue and other Viable, Non-Viable, Eschar, Exudate, Fat, Fibrin/Slough, Subcutaneous material debrided: Instrument: Curette, Forceps, Scissors Bleeding: Minimum Hemostasis Achieved: Pressure End Time: 08:52 Procedural Pain: 0 Post Procedural Pain: 0 Response to Treatment: Procedure was tolerated well Post Debridement Measurements of Total Wound Length: (cm) 2.4 Width: (cm) 2 Depth: (cm) 0.2 Volume: (cm) 0.754 Post Procedure Diagnosis Same as Pre-procedure Electronic Signature(s) Signed: 04/19/2015 4:05:44 PM By: Elpidio Eric BSN, RN Signed: 04/19/2015 4:13:26 PM By: Madelaine Bhat MD Previous  Signature: 04/19/2015 8:51:40 AM Version By: Elpidio Eric BSN, RN Entered By: Madelaine Bhat on 04/19/2015 09:17:53 Urenda, Juelz H. ([id number]) Schlee, Elsye H. ([id number]) -------------------------------------------------------------------------------- Debridement Details Patient Name: [name], [name] Date of Service: 04/19/2015 8:00 AM Medical Record Patient Account Number: [id number] Number: Treating RN: Afful, RN, BSN, Rita 26-Apr-1956 [phone] y.o. Other Clinician: Date of Birth/Sex: Female) Treating BURNS III, Primary Care Physician/Extender: Karn Cassis Physician: Referring Physician: Jones Broom in Treatment: 11 Debridement Performed for Wound #3 Left,Lateral Lower Leg Assessment: Performed By: Physician  BURNS III, Melanie Crazier., MD Debridement: Debridement Pre-procedure Yes Verification/Time Out Taken: Start Time: 08:52 Pain Control: Lidocaine 4% Topical Solution Level: Skin/Subcutaneous Tissue Total Area Debrided (L x 10 (cm) x 5 (cm) = 50 (cm) W): Tissue and other Viable, Non-Viable, Eschar, Exudate, Fat, Fibrin/Slough, Subcutaneous material debrided: Instrument: Curette, Forceps, Scissors Bleeding: Minimum Hemostasis Achieved: Pressure End Time: 08:57 Procedural Pain: 0 Post Procedural Pain: 0 Response to Treatment: Procedure was tolerated well Post Debridement Measurements of Total Wound Length: (cm) 26 Width: (cm) 18 Depth: (cm) 0.6 Volume: (cm) 220.54 Post Procedure Diagnosis Same as Pre-procedure Notes Biopsy culture obtained today. Electronic Signature(s) Signed: 04/19/2015 4:05:44 PM By: Elpidio Eric BSN, RN Signed: 04/19/2015 4:13:26 PM By: Madelaine Bhat MD [name], [name] ([id number]) Previous Signature: 04/19/2015 8:53:56 AM Version By: Elpidio Eric BSN, RN Entered By: Madelaine Bhat on 04/19/2015 09:18:32 Hellard, [name]  ([id number]) -------------------------------------------------------------------------------- HPI Details Patient Name: [name], [name] Date of Service: 04/19/2015 8:00 AM Medical Record Patient Account Number: [id number] Number: Treating RN: Afful, RN, BSN, Rita 1955/08/14 [phone] y.o. Other Clinician: Date of Birth/Sex: Female) Treating BURNS III, Primary Care Physician/Extender: Karn Cassis Physician: Referring Physician: Jones Broom in Treatment: 11 History of Present Illness Location: Painful ulcerated areas left lower extremity more than right lower extremity Quality: Patient reports experiencing a sharp pain to affected area(s). Severity: Patient states wound are getting worse. Duration: Patient has had the wound for < 4 weeks prior to presenting for treatment Timing: Pain in wound is Intermittent (comes and goes Context: The wound appeared gradually over time Modifying Factors: Other treatment(s) tried include: she has had Keflex for about 10 days. Associated Signs and Symptoms: Patient reports having difficulty standing for long periods. HPI Description: 59 year old with h/o DM (Hgb A1c 7.4 in Aug 2016), ESRD (on hemodialysis since July 2016). Presented to her PCP Dr. Venora Maples 01/25/2015 for BLE ulcers since June 2016. Cultures from 03/07/2015 grew Stenotrophomonas maltophilia and enterococcus species sensitive to levofloxacin, Septra, ampicillin. Treated with doxycycline. She was taken to the OR on 03/13/2015 for a angioplasty of her left arm AV fistula. No lower extremity debridement or biopsies. Arterial ultrasound in August 2016 showed no significant peripheral arterial disease. Performing dressing changes with Santyl. Using Tubigrip for edema control. Has previously declined operative debridement. She is without new complaints today. No significant pain. No fever or chills. Minimal drainage. Electronic Signature(s) Signed:  04/19/2015 4:13:26 PM By: Madelaine Bhat MD Entered By: Madelaine Bhat on 04/19/2015 09:29:54 Simic, [name] ([id number]) -------------------------------------------------------------------------------- Physical Exam Details Patient Name: [name], [name] Date of Service: 04/19/2015 8:00 AM Medical Record Patient Account Number: [id number] Number: Treating RN: Afful, RN, BSN, Rita October 06, 1955 [phone] y.o. Other Clinician: Date of Birth/Sex: Female) Treating BURNS III, Primary Care Physician/Extender: Karn Cassis Physician: Referring Physician: Fidel Levy Weeks in Treatment: 11 Constitutional . Pulse regular. Respirations normal and unlabored. Afebrile. Marland Kitchen Respiratory WNL. No retractions.. Cardiovascular Pedal Pulses WNL. Integumentary (Hair, Skin) .Marland Kitchen Neurological Sensation normal to touch, pin,and vibration. Psychiatric . Oriented times 3.. No evidence of depression, anxiety, or agitation.. Notes Bilateral calf ulcerations. Full-thickness. Eschar and necrotic fat sharply debrided. Deep tissue biopsy culture from left calf obtained. No exposed deep structures at this time. Minimal erythema. 2+ pitting edema. Palpable DP bilaterally. Electronic Signature(s) Signed: 04/19/2015 4:13:26 PM By: Madelaine Bhat MD Entered By: Madelaine Bhat on 04/19/2015 09:31:04 Haydel, [name] ([id number]) -------------------------------------------------------------------------------- Physician Orders Details Patient Name: [name],  [name] Date of Service: 04/19/2015 8:00 AM Medical Record Patient Account Number: [id number] Number: Treating RN: Afful, RN, BSN, Rita [birth year] [phone] y.o. Other Clinician: Date of Birth/Sex: Female) Treating BURNS III, Primary Care Physician/Extender: Karn Cassis Physician: Referring Physician: Jones Broom in Treatment: 49 Verbal / Phone Orders: Yes Clinician: Afful, RN,  BSN, Rita Read Back and Verified: Yes Diagnosis Coding Wound Cleansing Wound #1 Right,Proximal Lower Leg o Clean wound with Normal Saline. o May Shower, gently pat wound dry prior to applying new dressing. Wound #2 Right,Distal Lower Leg o Clean wound with Normal Saline. o May Shower, gently pat wound dry prior to applying new dressing. Wound #3 Left,Lateral Lower Leg o Clean wound with Normal Saline. o May Shower, gently pat wound dry prior to applying new dressing. Wound #8 Right,Posterior Lower Leg o Clean wound with Normal Saline. o May Shower, gently pat wound dry prior to applying new dressing. Anesthetic Wound #1 Right,Proximal Lower Leg o Topical Lidocaine 4% cream applied to wound bed prior to debridement Wound #2 Right,Distal Lower Leg o Topical Lidocaine 4% cream applied to wound bed prior to debridement Wound #3 Left,Lateral Lower Leg o Topical Lidocaine 4% cream applied to wound bed prior to debridement Wound #8 Right,Posterior Lower Leg o Topical Lidocaine 4% cream applied to wound bed prior to debridement Primary Wound Dressing Wound #1 Right,Proximal Lower Leg o Santyl Ointment [name], [name] ([id number]) Wound #2 Right,Distal Lower Leg o Santyl Ointment Wound #3 Left,Lateral Lower Leg o Santyl Ointment Wound #8 Right,Posterior Lower Leg o Santyl Ointment Secondary Dressing Wound #1 Right,Proximal Lower Leg o ABD and Kerlix/Conform Wound #3 Left,Lateral Lower Leg o ABD and Kerlix/Conform Wound #8 Right,Posterior Lower Leg o ABD and Kerlix/Conform Dressing Change Frequency Wound #1 Right,Proximal Lower Leg o Change Dressing Monday, Wednesday, Friday Wound #2 Right,Distal Lower Leg o Change Dressing Monday, Wednesday, Friday Wound #3 Left,Lateral Lower Leg o Change Dressing Monday, Wednesday, Friday Wound #8 Right,Posterior Lower Leg o Change Dressing Monday, Wednesday, Friday Follow-up  Appointments Wound #1 Right,Proximal Lower Leg o Return Appointment in 2 weeks. Wound #2 Right,Distal Lower Leg o Return Appointment in 2 weeks. Wound #3 Left,Lateral Lower Leg o Return Appointment in 2 weeks. Wound #8 Right,Posterior Lower Leg o Return Appointment in 2 weeks. Edema Control Wound #1 Right,Proximal Lower Leg [name], [name] ([id number]) o Tubigrip Wound #2 Right,Distal Lower Leg o Tubigrip Wound #3 Left,Lateral Lower Leg o Tubigrip Wound #8 Right,Posterior Lower Leg o Tubigrip Home Health Wound #1 Right,Proximal Lower Leg o Continue Home Health Visits - [facility] o Home Health Nurse may visit PRN to address patientos wound care needs. o FACE TO FACE ENCOUNTER: MEDICARE and MEDICAID PATIENTS: I certify that this patient is under my care and that I had a face-to-face encounter that meets the physician face-to-face encounter requirements with this patient on this date. The encounter with the patient was in whole or in part for the following MEDICAL CONDITION: (primary reason for Home Healthcare) MEDICAL NECESSITY: I certify, that based on my findings, NURSING services are a medically necessary home health service. HOME BOUND STATUS: I certify that my clinical findings support that this patient is homebound (i.e., Due to illness or injury, pt requires aid of supportive devices such as crutches, cane, wheelchairs, walkers, the use of special transportation or the assistance of another person to leave their place of residence. There is a normal inability to leave the home and doing so requires considerable and taxing effort. Other  absences are for medical reasons / religious services and are infrequent or of short duration when for other reasons). o If current dressing causes regression in wound condition, may D/C ordered dressing product/s and apply Normal Saline Moist Dressing daily until next Wound Healing Center / Other MD appointment.  Notify Wound Healing Center of regression in wound condition at [phone]. o Please direct any NON-WOUND related issues/requests for orders to patient's Primary Care Physician Wound #2 Right,Distal Lower Leg o Continue Home Health Visits - [facility] o Home Health Nurse may visit PRN to address patientos wound care needs. o FACE TO FACE ENCOUNTER: MEDICARE and MEDICAID PATIENTS: I certify that this patient is under my care and that I had a face-to-face encounter that meets the physician face-to-face encounter requirements with this patient on this date. The encounter with the patient was in whole or in part for the following MEDICAL CONDITION: (primary reason for Home Healthcare) MEDICAL NECESSITY: I certify, that based on my findings, NURSING services are a medically necessary home health service. HOME BOUND STATUS: I certify that my clinical findings support that this patient is homebound (i.e., Due to illness or injury, pt requires aid of supportive devices such as crutches, cane, wheelchairs, walkers, the use of special transportation or the assistance of another person to leave their place of residence. There is a normal inability to leave the home and doing so requires considerable and taxing effort. Other absences are for medical reasons / religious services and are infrequent or of short duration when for other reasons). [name], [name] ([id number]) o If current dressing causes regression in wound condition, may D/C ordered dressing product/s and apply Normal Saline Moist Dressing daily until next Wound Healing Center / Other MD appointment. Notify Wound Healing Center of regression in wound condition at [phone]. o Please direct any NON-WOUND related issues/requests for orders to patient's Primary Care Physician Wound #3 Left,Lateral Lower Leg o Continue Home Health Visits - [facility] o Home Health Nurse may visit PRN to address patientos wound care  needs. o FACE TO FACE ENCOUNTER: MEDICARE and MEDICAID PATIENTS: I certify that this patient is under my care and that I had a face-to-face encounter that meets the physician face-to-face encounter requirements with this patient on this date. The encounter with the patient was in whole or in part for the following MEDICAL CONDITION: (primary reason for Home Healthcare) MEDICAL NECESSITY: I certify, that based on my findings, NURSING services are a medically necessary home health service. HOME BOUND STATUS: I certify that my clinical findings support that this patient is homebound (i.e., Due to illness or injury, pt requires aid of supportive devices such as crutches, cane, wheelchairs, walkers, the use of special transportation or the assistance of another person to leave their place of residence. There is a normal inability to leave the home and doing so requires considerable and taxing effort. Other absences are for medical reasons / religious services and are infrequent or of short duration when for other reasons). o If current dressing causes regression in wound condition, may D/C ordered dressing product/s and apply Normal Saline Moist Dressing daily until next Wound Healing Center / Other MD appointment. Notify Wound Healing Center of regression in wound condition at [phone]. o Please direct any NON-WOUND related issues/requests for orders to patient's Primary Care Physician Laboratory o Culture and Sensitivity - left leg oooo Electronic Signature(s) Signed: 04/19/2015 5:55:05 AM By: Elpidio Eric BSN, RN Signed: 04/19/2015 4:13:26 PM By: Madelaine Bhat MD Entered By: Clover Mealy  Rita on 04/19/2015 08:55:05 [name], Aniyia Rexene Edison ([id number]) -------------------------------------------------------------------------------- Problem List Details Patient Name: Kinnaird, Jakyria H. Date of Service: 04/19/2015 8:00 AM Medical Record Patient Account Number:  [id number] Number: Treating RN: Afful, RN, BSN, Rita [birth year] [phone] y.o. 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 Date of Service: 04/19/2015 8:00 AM Medical Record Patient Account Number: [id number] Number: Treating RN: Afful, RN, BSN, Rita [birth year] [phone] y.o. Other Clinician: Date of Birth/Sex: Female) Treating BURNS III, Primary Care Physician/Extender: Karn Cassis Physician: Referring Physician: Jones Broom in Treatment: 11 Subjective Chief Complaint Information obtained from Patient Bilateral calf ulcers. History of Present Illness (HPI) The following HPI elements were documented for the patient's wound: Location: Painful ulcerated areas left lower extremity more than right lower extremity Quality: Patient reports experiencing a sharp pain to affected area(s). Severity: Patient states wound are getting worse. Duration: Patient has had the wound for < 4 weeks prior to presenting for treatment Timing: Pain in wound is Intermittent (comes and  goes Context: The wound appeared gradually over time Modifying Factors: Other treatment(s) tried include: she has had Keflex for about 10 days. Associated Signs and Symptoms: Patient reports having difficulty standing for long periods. 59 year old with h/o DM (Hgb A1c 7.4 in Aug 2016), ESRD (on hemodialysis since July 2016). Presented to her PCP Dr. Venora Maples 01/25/2015 for BLE ulcers since June 2016. Cultures from 03/07/2015 grew Stenotrophomonas maltophilia and enterococcus species sensitive to levofloxacin, Septra, ampicillin. Treated with doxycycline. She was taken to the OR on 03/13/2015 for a angioplasty of her left arm AV fistula. No lower extremity debridement or biopsies. Arterial ultrasound in August 2016 showed no significant peripheral arterial disease. Performing dressing changes with Santyl. Using Tubigrip for edema control. Has previously declined operative debridement. She is without new complaints today. No significant pain. No fever or chills. Minimal drainage. Objective Lemar, Geniva H. ([id number]) Constitutional Pulse regular. Respirations normal and unlabored. Afebrile. Vitals Time Taken: 8:11 AM, Height: 68 in, Weight: 294 lbs, BMI: 44.7, Temperature: 98.1 F, Pulse: 96 bpm, Respiratory Rate: 18 breaths/min, Blood Pressure: 141/79 mmHg. Respiratory WNL. No retractions.. Cardiovascular Pedal Pulses WNL. Neurological Sensation normal to touch, pin,and vibration. Psychiatric Oriented times 3.. No evidence of depression, anxiety, or agitation.. General Notes: Bilateral calf ulcerations. Full-thickness. Eschar and necrotic fat sharply debrided. Deep tissue biopsy culture from left calf obtained. No exposed deep structures at this time. Minimal erythema. 2+ pitting edema. Palpable DP bilaterally. Integumentary (Hair, Skin) Wound #1 status is Open. Original cause of wound was Gradually Appeared. The wound is located on the Right,Proximal Lower Leg. The wound  measures 2.5cm length x 2.4cm width x 0.2cm depth; 4.712cm^2 area and 0.942cm^3 volume. The wound is limited to skin breakdown. There is no tunneling or undermining noted. There is a small amount of serosanguineous drainage noted. The wound  margin is distinct with the outline attached to the wound base. There is no granulation within the wound bed. There is a large (67- 100%) amount of necrotic tissue within the wound bed including Eschar and Adherent Slough. The periwound skin appearance exhibited: Localized Edema, Dry/Scaly, Moist. The periwound skin appearance did not exhibit: Callus, Crepitus, Excoriation, Fluctuance, Friable, Induration, Rash, Scarring, Maceration, Atrophie Blanche, Cyanosis, Ecchymosis, Hemosiderin Staining, Mottled, Pallor, Rubor, Erythema. Periwound temperature was noted as No Abnormality. The periwound has tenderness on palpation. Wound #2 status is Open. Original cause of wound was Gradually Appeared. The wound is located on the Right,Distal Lower Leg. The wound measures 2.4cm length x 2cm width x 0.2cm depth; 3.77cm^2 area and 0.754cm^3 volume. The wound is limited to skin breakdown. There is no tunneling or undermining noted. There is a small amount of serosanguineous drainage noted. The wound margin is distinct with the outline attached to the wound base. There is no granulation within the wound bed. There is a large (67-100%) amount of necrotic tissue within the wound bed including Eschar and Adherent Slough. The periwound skin appearance exhibited: Localized Edema, Dry/Scaly, Moist. The periwound skin appearance did not exhibit: Callus, Crepitus, Excoriation, Fluctuance, Friable, Induration, Rash, Scarring, Maceration, Atrophie Blanche, Cyanosis, Ecchymosis, Hemosiderin Staining, Mottled, Pallor, Rubor, Erythema. Periwound temperature was noted as No Abnormality. The periwound has tenderness on palpation. Wound #3 status is Open. Original cause of wound was  Gradually Appeared. The wound is located on the [facility], Yesly H. ([id number]) Left,Lateral Lower Leg. The wound measures 26cm length x 18cm width x 0.5cm depth; 367.566cm^2 area and 183.783cm^3 volume. The wound is limited to skin breakdown. There is no tunneling or undermining noted. There is a medium amount of purulent drainage noted. The wound margin is flat and intact. There is no granulation within the wound bed. There is a large (67-100%) amount of necrotic tissue within the wound bed including Eschar and Adherent Slough. The periwound skin appearance exhibited: Localized Edema, Maceration, Moist. The periwound skin appearance did not exhibit: Callus, Crepitus, Excoriation, Fluctuance, Friable, Induration, Rash, Scarring, Dry/Scaly, Atrophie Blanche, Cyanosis, Ecchymosis, Hemosiderin Staining, Mottled, Pallor, Rubor, Erythema. Periwound temperature was noted as No Abnormality. The periwound has tenderness on palpation. Wound #8 status is Open. Original cause of wound was Gradually Appeared. The wound is located on the Right,Posterior Lower Leg. The wound measures 6cm length x 7cm width x 0.1cm depth; 32.987cm^2 area and 3.299cm^3 volume. The wound is limited to skin breakdown. There is no tunneling or undermining noted. There is a small amount of drainage noted. The wound margin is distinct with the outline attached to the wound base. There is small (1-33%) pink granulation within the wound bed. There is a medium (34- 66%) amount of necrotic tissue within the wound bed including Eschar and Adherent Slough. The periwound skin appearance exhibited: Localized Edema, Moist. The periwound skin appearance did not exhibit: Callus, Crepitus, Excoriation, Fluctuance, Friable, Induration, Rash, Scarring, Dry/Scaly, Maceration, Atrophie Blanche, Cyanosis, Ecchymosis, Hemosiderin Staining, Mottled, Pallor, Rubor, Erythema. Periwound temperature was noted as No Abnormality. The periwound has  tenderness on palpation. 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  There was a Skin/Subcutaneous Tissue Debridement ([id number]) debridement with total area of 6 sq cm performed by BURNS III, Melanie Crazier., MD. with the following instrument(s): Curette, Forceps, and Scissors to remove Non-Viable Ambroise, Jennessa H. ([id number]) tissue/material including Exudate, Fat, Fibrin/Slough, Eschar, and Subcutaneous after achieving pain control using Lidocaine 4% Topical Solution. A time out was conducted prior to the start of the procedure. A Minimum amount of bleeding was controlled with Pressure. The procedure was tolerated well with a pain level of 0 throughout and a pain level of 0 following the procedure. Post Debridement Measurements: 2.5cm length x 2.4cm width x 0.3cm depth; 1.414cm^3 volume. 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 There was a Skin/Subcutaneous Tissue Debridement ([id number]) debridement with total area of 4.8 sq cm performed by BURNS III, Melanie Crazier., MD. with the following instrument(s): Curette, Forceps, and Scissors to remove Viable and Non-Viable tissue/material including Exudate, Fat, Fibrin/Slough, Eschar, and Subcutaneous after achieving pain control using Lidocaine 4% Topical Solution. A time out was conducted prior to the start of the procedure. A Minimum amount of bleeding was controlled with Pressure. The procedure was tolerated well with a pain level of 0 throughout and a  pain level of 0 following the procedure. Post Debridement Measurements: 2.4cm length x 2cm width x 0.2cm depth; 0.754cm^3 volume. Post procedure Diagnosis Wound #2: Same as Pre-Procedure Wound #3 Wound #3 is a Calciphylaxis located on the Left,Lateral Lower Leg . There was a Skin/Subcutaneous Tissue Debridement ([id number]) debridement with total area of 50 sq cm performed by BURNS III, Melanie Crazier., MD. with the following instrument(s): Curette, Forceps, and Scissors to remove Viable and Non-Viable tissue/material including Exudate, Fat, Fibrin/Slough, Eschar, and Subcutaneous after achieving pain control using Lidocaine 4% Topical Solution. A time out was conducted prior to the start of the procedure. A Minimum amount of bleeding was controlled with Pressure. The procedure was tolerated well with a pain level of 0 throughout and a pain level of 0 following the procedure. Post Debridement Measurements: 26cm length x 18cm width x 0.6cm depth; 220.54cm^3 volume. Post procedure Diagnosis Wound #3: Same as Pre-Procedure General Notes: Biopsy culture obtained today.. Plan Wound Cleansing: Wound #1 Right,Proximal Lower Leg: Clean wound with Normal Saline. May Shower, gently pat wound dry prior to applying new dressing. Wound #2 Right,Distal Lower Leg: Clean wound with Normal Saline. May Shower, gently pat wound dry prior to applying new dressing. Wound #3 Left,Lateral Lower Leg: Clean wound with Normal Saline. May Shower, gently pat wound dry prior to applying new dressing. Wound #8 Right,Posterior Lower Leg: Clean wound with Normal Saline. CIERRIA, HEIGHT ([id number]) May Shower, gently pat wound dry prior to applying new dressing. Anesthetic: Wound #1 Right,Proximal Lower Leg: Topical Lidocaine 4% cream applied to wound bed prior to debridement Wound #2 Right,Distal Lower Leg: Topical Lidocaine 4% cream applied to wound bed prior to debridement Wound #3 Left,Lateral Lower  Leg: Topical Lidocaine 4% cream applied to wound bed prior to debridement Wound #8 Right,Posterior Lower Leg: Topical Lidocaine 4% cream applied to wound bed prior to debridement Primary Wound Dressing: Wound #1 Right,Proximal Lower Leg: Santyl Ointment Wound #2 Right,Distal Lower Leg: Santyl Ointment Wound #3 Left,Lateral Lower Leg: Santyl Ointment Wound #8 Right,Posterior Lower Leg: Santyl Ointment Secondary Dressing: Wound #1 Right,Proximal Lower Leg: ABD and Kerlix/Conform Wound #3 Left,Lateral Lower Leg: ABD and Kerlix/Conform Wound #8 Right,Posterior Lower Leg: ABD and Kerlix/Conform Dressing Change Frequency: Wound #  1 Right,Proximal Lower Leg: Change Dressing Monday, Wednesday, Friday Wound #2 Right,Distal Lower Leg: Change Dressing Monday, Wednesday, Friday Wound #3 Left,Lateral Lower Leg: Change Dressing Monday, Wednesday, Friday Wound #8 Right,Posterior Lower Leg: Change Dressing Monday, Wednesday, Friday Follow-up Appointments: Wound #1 Right,Proximal Lower Leg: Return Appointment in 2 weeks. Wound #2 Right,Distal Lower Leg: Return Appointment in 2 weeks. Wound #3 Left,Lateral Lower Leg: Return Appointment in 2 weeks. Wound #8 Right,Posterior Lower Leg: Return Appointment in 2 weeks. Edema Control: Wound #1 Right,Proximal Lower Leg: Tubigrip Wound #2 Right,Distal Lower Leg: Tubigrip Wound #3 Left,Lateral Lower Leg: Tubigrip Pavlik, Meilani H. ([id number]) Wound #8 Right,Posterior Lower Leg: Tubigrip Home Health: Wound #1 Right,Proximal Lower Leg: Continue Home Health Visits - [facility] Health Nurse may visit PRN to address patient s wound care needs. FACE TO FACE ENCOUNTER: MEDICARE and MEDICAID PATIENTS: I certify that this patient is under my care and that I had a face-to-face encounter that meets the physician face-to-face encounter requirements with this patient on this date. The encounter with the patient was in whole or in part for  the following MEDICAL CONDITION: (primary reason for Home Healthcare) MEDICAL NECESSITY: I certify, that based on my findings, NURSING services are a medically necessary home health service. HOME BOUND STATUS: I certify that my clinical findings support that this patient is homebound (i.e., Due to illness or injury, pt requires aid of supportive devices such as crutches, cane, wheelchairs, walkers, the use of special transportation or the assistance of another person to leave their place of residence. There is a normal inability to leave the home and doing so requires considerable and taxing effort. Other absences are for medical reasons / religious services and are infrequent or of short duration when for other reasons). If current dressing causes regression in wound condition, may D/C ordered dressing product/s and apply Normal Saline Moist Dressing daily until next Wound Healing Center / Other MD appointment. Notify Wound Healing Center of regression in wound condition at [phone]. Please direct any NON-WOUND related issues/requests for orders to patient's Primary Care Physician Wound #2 Right,Distal Lower Leg: Continue Home Health Visits - [facility] Health Nurse may visit PRN to address patient s wound care needs. FACE TO FACE ENCOUNTER: MEDICARE and MEDICAID PATIENTS: I certify that this patient is under my care and that I had a face-to-face encounter that meets the physician face-to-face encounter requirements with this patient on this date. The encounter with the patient was in whole or in part for the following MEDICAL CONDITION: (primary reason for Home Healthcare) MEDICAL NECESSITY: I certify, that based on my findings, NURSING services are a medically necessary home health service. HOME BOUND STATUS: I certify that my clinical findings support that this patient is homebound (i.e., Due to illness or injury, pt requires aid of supportive devices such as crutches, cane,  wheelchairs, walkers, the use of special transportation or the assistance of another person to leave their place of residence. There is a normal inability to leave the home and doing so requires considerable and taxing effort. Other absences are for medical reasons / religious services and are infrequent or of short duration when for other reasons). If current dressing causes regression in wound condition, may D/C ordered dressing product/s and apply Normal Saline Moist Dressing daily until next Wound Healing Center / Other MD appointment. Notify Wound Healing Center of regression in wound condition at [phone]. Please direct any NON-WOUND related issues/requests for orders to patient's Primary Care Physician Wound #3 Left,Lateral Lower Leg:  Continue Home Health Visits - [facility] Health Nurse may visit PRN to address patient s wound care needs. FACE TO FACE ENCOUNTER: MEDICARE and MEDICAID PATIENTS: I certify that this patient is under my care and that I had a face-to-face encounter that meets the physician face-to-face encounter requirements with this patient on this date. The encounter with the patient was in whole or in part for the following MEDICAL CONDITION: (primary reason for Home Healthcare) MEDICAL NECESSITY: I certify, that based on my findings, NURSING services are a medically necessary home health service. HOME BOUND STATUS: I certify that my clinical findings support that this patient is homebound (i.e., Due to illness or injury, pt requires aid of supportive devices such as crutches, cane, wheelchairs, walkers, the use of special transportation or the assistance of another person to leave their place of residence. There is a normal inability to leave the home and doing so requires considerable and taxing effort. Other absences are for medical reasons / religious services and are infrequent or of short duration when for other reasons). If current dressing causes regression  in wound condition, may D/C ordered dressing product/s and apply Mcgahee, Kyna H. ([id number]) Normal Saline Moist Dressing daily until next Wound Healing Center / Other MD appointment. Notify Wound Healing Center of regression in wound condition at [phone]. Please direct any NON-WOUND related issues/requests for orders to patient's Primary Care Physician Laboratory ordered were: Culture and Sensitivity - left leg Follow-up biopsy culture obtained today. No antibiotics started. Continue Santyl. Edema control with Tubigrip for now. Order juxtalite compression garments. We will hold off on any compression bandages until wounds are cleaner. I do think she would benefit from aggressive debridement under anesthesia. She declined. Declined punch biopsy. Electronic Signature(s) Signed: 04/19/2015 4:13:26 PM By: Madelaine Bhat MD Entered By: Madelaine Bhat on 04/19/2015 09:33:40 Schools, [name] ([id number]) -------------------------------------------------------------------------------- SuperBill Details Patient Name: Cotten, Rudell H. Date of Service: 04/19/2015 Medical Record Patient Account Number: [id number] Number: Treating RN: Clover Mealy, RN, BSN, Rita Apr 13, 1956 [phone] y.o. Other Clinician: Date of Birth/Sex: Female) Treating BURNS III, Primary Care Physician/Extender: Karn Cassis Physician: Weeks in Treatment: 11 Referring Physician: Fidel Levy Diagnosis Coding ICD-10 Codes Code Description [phone] Type 2 diabetes mellitus with other skin ulcer N18.4 Chronic kidney disease, stage 4 (severe) L97.222 Non-pressure chronic ulcer of left calf with fat layer exposed L97.212 Non-pressure chronic ulcer of right calf with fat layer exposed R60.0 Localized edema Facility Procedures CPT4 Code: 62952841 Description: 11042 - DEB SUBQ TISSUE 20 SQ CM/< ICD-10 Description Diagnosis E11.622 Type 2 diabetes mellitus with other skin  ulcer Modifier: Quantity: 1 CPT4 Code: 32440102 Description: 11045 - DEB SUBQ TISS EA ADDL 20CM ICD-10 Description Diagnosis E11.622 Type 2 diabetes mellitus with other skin ulcer Modifier: Quantity: 3 Physician Procedures CPT4 Code: [id number] Description: WC PHYS LEVEL 3 o NEW PT ICD-10 Description Diagnosis E11.622 Type 2 diabetes mellitus with other skin ulcer L97.222 Non-pressure chronic ulcer of left calf with fat lay L97.212 Non-pressure chronic ulcer of right calf with fat la R60.0  Localized edema Modifier: er exposed yer exposed Quantity: 1 CPT4 Code: 4034742 Eslick, PAM Description: 11042 - WC PHYS SUBQ TISS 20 SQ CM Description ELA H. ([id number]) Modifier: Quantity: 1 Electronic Signature(s) Signed: 04/19/2015 4:13:26 PM By: Madelaine Bhat MD Entered By: Madelaine Bhat on 04/19/2015 09:33:01

## 2015-04-24 LAB — WOUND CULTURE

## 2015-04-25 ENCOUNTER — Other Ambulatory Visit: Payer: Self-pay | Admitting: Vascular Surgery

## 2015-04-26 ENCOUNTER — Encounter: Payer: BC Managed Care – PPO | Admitting: Surgery

## 2015-04-26 DIAGNOSIS — L97222 Non-pressure chronic ulcer of left calf with fat layer exposed: Secondary | ICD-10-CM | POA: Diagnosis not present

## 2015-04-26 NOTE — Progress Notes (Addendum)
KIDADA, GING ([id number]) Visit Report for 04/26/2015 Arrival Information Details Patient Name: [name], [name]. Date of Service: 04/26/2015 8:00 AM Medical Record Patient Account Number: [id number] Number: Treating RN: Afful, RN, BSN, Rita [birth year] [phone] y.o. Other Clinician: Date of Birth/Sex: Female) Treating BURNS III, Primary Care Physician: Fidel Levy Physician/Extender: Zollie Beckers Referring Physician: Jones Broom in Treatment: 12 Visit Information History Since Last Visit Any new allergies or adverse reactions: No Patient Arrived: Ambulatory Had a fall or experienced change in No Arrival Time: 08:15 activities of daily living that may affect Accompanied By: self risk of falls: Transfer Assistance: None Signs or symptoms of abuse/neglect since last No Patient Identification Verified: Yes visito Secondary Verification Process Yes Hospitalized since last visit: No Completed: Has Dressing in Place as Prescribed: Yes Patient Has Alerts: Yes Pain Present Now: No Patient Alerts: DMII ABI [place] Bilateral Electronic Signature(s) Signed: 04/26/2015 8:15:35 AM By: Elpidio Eric BSN, RN Entered By: Elpidio Eric on 04/26/2015 08:15:35 Schiefelbein, [name] ([id number]) -------------------------------------------------------------------------------- Encounter Discharge Information Details Patient Name: [name], [name] Date of Service: 04/26/2015 8:00 AM Medical Record Patient Account Number: [id number] Number: Treating RN: Afful, RN, BSN, Rita [birth year] [phone] y.o. Other Clinician: Date of Birth/Sex: Female) Treating BURNS III, Primary Care Physician: Fidel Levy Physician/Extender: Zollie Beckers Referring Physician: Jones Broom in Treatment: 12 Encounter Discharge Information Items Discharge Pain Level: 0 Discharge Condition: Stable Ambulatory Status: Ambulatory Discharge Destination: Home Transportation: Private  Auto Accompanied By: self Schedule Follow-up Appointment: No Medication Reconciliation completed and provided to Patient/Care No [place]: Provided on Clinical Summary of Care: 04/26/2015 Form Type Recipient Paper Patient PM Electronic Signature(s) Signed: 04/26/2015 9:05:58 AM By: Gwenlyn Perking Previous Signature: 04/26/2015 8:45:05 AM Version By: Elpidio Eric BSN, RN Entered By: Gwenlyn Perking on 04/26/2015 09:05:57 [name], [name] ([id number]) -------------------------------------------------------------------------------- Lower Extremity Assessment Details Patient Name: [name], [name] Date of Service: 04/26/2015 8:00 AM Medical Record Patient Account Number: [id number] Number: Treating RN: Afful, RN, BSN, Rita 1955-11-26 [phone] y.o. Other Clinician: Date of Birth/Sex: Female) Treating BURNS III, Primary Care Physician: Fidel Levy Physician/Extender: Zollie Beckers Referring Physician: Jones Broom in Treatment: 12 Edema Assessment Assessed: [Left: No] [Right: No] E[Left: dema] [Right: :] Calf Left: Right: Point of Measurement: 34 cm From Medial Instep 48.6 cm 46.2 cm Ankle Left: Right: Point of Measurement: 10 cm From Medial Instep 26.4 cm 26.2 cm Vascular Assessment Pulses: Posterior Tibial Dorsalis Pedis Palpable: [Left:Yes] [Right:Yes] Extremity colors, hair growth, and conditions: Extremity Color: [Left:Hyperpigmented] [Right:Hyperpigmented] Hair Growth on Extremity: [Left:No] [Right:No] Temperature of Extremity: [Left:Warm] [Right:Warm] Capillary Refill: [Left:< 3 seconds] [Right:< 3 seconds] Toe Nail Assessment Left: Right: Thick: Yes Yes Discolored: No No Deformed: No No Improper Length and Hygiene: No No Electronic Signature(s) Signed: 04/26/2015 8:23:08 AM By: Elpidio Eric BSN, RN Entered By: Elpidio Eric on 04/26/2015 08:23:08 [name], [name] ([id number]) [name], [name]  ([id number]) -------------------------------------------------------------------------------- Multi Wound Chart Details Patient Name: [name], [name] Date of Service: 04/26/2015 8:00 AM Medical Record Patient Account Number: [id number] Number: Treating RN: Afful, RN, BSN, Rita 1956-02-14 [phone] y.o. 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] Date Acquired: [1:01/09/2015] [2:01/09/2015] [3:01/09/2015] Weeks of Treatment: [1:12] [2:12] [3:12] Wound Status: [1:Open] [2:Open] [3:Open] Measurements L x W x D 2x2.5x0.2 [2:2.2x4.5x0.2] [3:25x18x0.5] (cm) Area (cm) : [1:3.927] [2:7.775] [3:353.429] Volume (cm) : [1:0.785] [2:1.555] [3:176.715] % Reduction in Area: [1:-8255.30%] [2:-8171.30%] [3:-3114.20%] % Reduction in Volume: -15600.00% [2:-17177.80%] [3:-15965.00%] Classification: [1:Full Thickness Without Exposed Support Structures] [2:Full Thickness Without Exposed Support Structures] [3:Full Thickness Without Exposed Support Structures] Periwound Skin Texture: No Abnormalities Noted No Abnormalities Noted [3:No Abnormalities Noted] Periwound Skin [1:No Abnormalities Noted No Abnormalities Noted] [3:No Abnormalities Noted] Moisture: Periwound Skin Color: No Abnormalities Noted No Abnormalities Noted [3:No Abnormalities Noted] Tenderness on [1:No]  [2:No] [3:No] Wound Number: 8 N/A N/A Photos: No Photos N/A N/A Wound Location: Right, Posterior Lower N/A N/A Leg Wounding Event: Gradually Appeared N/A N/A [name], [name] ([id number]) Primary Etiology: Calciphylaxis N/A N/A Date Acquired: 03/27/2015 N/A N/A Weeks of Treatment: 3 N/A N/A Wound Status: Open N/A N/A Measurements L x W x D 6x7x0.1 N/A N/A (cm) Area (cm) : 32.987 N/A N/A Volume (cm) : 3.299 N/A N/A % Reduction in Area: -1766.80% N/A N/A % Reduction in Volume: -1763.80% N/A N/A Classification: Partial Thickness N/A N/A Periwound Skin Texture: No Abnormalities Noted N/A N/A Periwound Skin No Abnormalities Noted N/A N/A Moisture: Periwound Skin Color: No Abnormalities Noted N/A N/A Tenderness on No N/A N/A Palpation: Treatment Notes Electronic Signature(s) Signed: 04/26/2015 8:37:49 AM By: Elpidio Eric BSN, RN Entered By: Elpidio Eric on 04/26/2015 08:37:49 Myricks, [name] ([id number]) -------------------------------------------------------------------------------- Multi-Disciplinary Care Plan Details Patient Name: Cockrell, Shariya H. Date of Service: 04/26/2015 8:00 AM Medical Record Patient Account Number: [id number] Number: Treating RN: Afful, RN, BSN, Rita 14-Apr-1956 [phone] y.o. Other Clinician: Date of Birth/Sex: Female) Treating BURNS III, Primary Care Physician: Fidel Levy Physician/Extender: Zollie Beckers Referring Physician: Jones Broom in Treatment: 12 Active Inactive Orientation to the Wound Care Program Nursing Diagnoses: Knowledge deficit related to the wound healing center program Goals: Patient/caregiver will verbalize understanding of the Wound Healing Center Program Date Initiated: 01/30/2015 Goal Status: Active Interventions: Provide education on orientation to the wound center Notes: Pain, Acute or Chronic Nursing Diagnoses: Pain, acute or chronic: actual or potential Goals: Patient will verbalize  adequate pain control and receive pain control interventions during procedures as needed Date Initiated: 01/30/2015 Goal Status: Active Patient/caregiver will verbalize adequate pain control between visits Date Initiated: 01/30/2015 Goal Status: Active Interventions: Assess comfort goal upon admission Encourage patient to take pain medications as prescribed Notes: Venous Leg Ulcer Hockley, Keyia H. ([id number]) Nursing Diagnoses: Potential for venous Insuffiency (use before diagnosis confirmed) Goals: Non-invasive venous studies are completed as ordered Date Initiated: 01/30/2015 Goal Status: Active Interventions: Assess peripheral edema status every visit. Notes: Wound/Skin Impairment Nursing Diagnoses: Impaired tissue integrity Goals: Ulcer/skin breakdown will have a volume reduction of 30% by week 4 Date Initiated: 01/30/2015 Goal Status: Active Ulcer/skin breakdown will have a volume reduction of 50% by week 8 Date Initiated: 01/30/2015 Goal Status: Active Ulcer/skin breakdown will have a volume reduction of 80% by week 12 Date Initiated: 01/30/2015 Goal Status: Active Ulcer/skin breakdown will heal within 14 weeks Date Initiated: 01/30/2015 Goal Status: Active Interventions: Assess ulceration(s) every visit Notes: Electronic Signature(s) Signed: 04/26/2015 8:37:41 AM By: Elpidio Eric BSN, RN Entered By: Elpidio Eric on 04/26/2015 08:37:41 Lockridge, Marshia H. ([id number]) -------------------------------------------------------------------------------- Pain Assessment Details Patient Name: Pooley, Kerstin H. Date of Service: 04/26/2015 8:00 AM Medical  Record Patient Account Number: [id number] Number: Treating RN: Clover Mealy, RN, BSN, Rita 1955/11/12 [phone] y.o. Other Clinician: Date of Birth/Sex: Female) Treating BURNS III, Primary Care Physician: Fidel Levy Physician/Extender: Zollie Beckers Referring Physician: Jones Broom in Treatment: 12 Active  Problems Location of Pain Severity and Description of Pain Patient Has Paino No Site Locations Pain Management and Medication Current Pain Management: Electronic Signature(s) Signed: 04/26/2015 8:17:40 AM By: Elpidio Eric BSN, RN Entered By: Elpidio Eric on 04/26/2015 08:17:40 Shoultz, [name] ([id number]) -------------------------------------------------------------------------------- Patient/Caregiver Education Details Patient Name: Motl, Anjanae H. Date of Service: 04/26/2015 8:00 AM Medical Record Patient Account Number: [id number] Number: Treating RN: Afful, RN, BSN, Rita [birth year] [phone] y.o. Other Clinician: Date of Birth/Gender: Female) Treating BURNS III, Primary Care Physician: Fidel Levy Physician/Extender: Zollie Beckers Referring Physician: Jones Broom in Treatment: 12 Education Assessment Education Provided To: Patient Education Topics Provided Welcome To The Wound Care Center: Methods: Explain/Verbal Responses: State content correctly Electronic Signature(s) Signed: 04/26/2015 8:45:17 AM By: Elpidio Eric BSN, RN Entered By: Elpidio Eric on 04/26/2015 08:45:17 Loescher, Cathleen Rexene [name] ([id number]) -------------------------------------------------------------------------------- Wound Assessment Details Patient Name: Agramonte, Deltha H. Date of Service: 04/26/2015 8:00 AM Medical Record Patient Account Number: [id number] Number: Treating RN: Afful, RN, BSN, Rita [birth year] [phone] y.o. Other Clinician: Date of Birth/Sex: Female) Treating BURNS III, Primary Care Physician: Fidel Levy Physician/Extender: Zollie Beckers Referring Physician: Jones Broom in Treatment: 12 Wound Status Wound Number: 1 Primary Etiology: Calciphylaxis Wound Location: Right, Proximal Lower Leg Wound Status: Open Wounding Event: Gradually Appeared Date Acquired: 01/09/2015 Weeks Of Treatment: 12 Clustered Wound: No Photos Photo Uploaded By:  Elpidio Eric on 04/26/2015 16:35:50 Wound Measurements Length: (cm) 2 Width: (cm) 2.5 Depth: (cm) 0.2 Area: (cm) 3.927 Volume: (cm) 0.785 % Reduction in Area: -8255.3% % Reduction in Volume: -15600% Wound Description Full Thickness Without Exposed Classification: Support Structures Periwound Skin Texture Texture Color No Abnormalities Noted: No No Abnormalities Noted: No Moisture No Abnormalities Noted: No Harton, Lycia H. ([id number]) Treatment Notes Wound #1 (Right, Proximal Lower Leg) 1. Cleansed with: Clean wound with Normal Saline 2. Anesthetic Topical Lidocaine 4% cream to wound bed prior to debridement 4. Dressing Applied: Santyl Ointment 5. Secondary Dressing Applied ABD and Kerlix/Conform 7. Secured with International aid/development worker) Signed: 04/26/2015 5:06:41 PM By: Elpidio Eric BSN, RN Entered By: Elpidio Eric on 04/26/2015 08:27:19 Titzer, [name] ([id number]) -------------------------------------------------------------------------------- Wound Assessment Details Patient Name: Giraud, Amayrani H. Date of Service: 04/26/2015 8:00 AM Medical Record Patient Account Number: [id number] Number: Treating RN: Afful, RN, BSN, Rita [birth year] [phone] y.o. Other Clinician: Date of Birth/Sex: Female) Treating BURNS III, Primary Care Physician: Fidel Levy Physician/Extender: Zollie Beckers Referring Physician: Jones Broom in Treatment: 12 Wound Status Wound Number: 2 Primary Etiology: Calciphylaxis Wound Location: Right, Distal Lower Leg Wound Status: Open Wounding Event: Gradually Appeared Date Acquired: 01/09/2015 Weeks Of Treatment: 12 Clustered Wound: No Photos Photo Uploaded By: Elpidio Eric on 04/26/2015 16:35:50 Wound Measurements Length: (cm) 2.2 Width: (cm) 4.5 Depth: (cm) 0.2 Area: (cm) 7.775 Volume: (cm) 1.555 % Reduction in Area: -8171.3% % Reduction in Volume: -17177.8% Wound Description Full Thickness  Without Exposed Classification: Support Structures Periwound Skin Texture Texture Color No Abnormalities Noted: No No Abnormalities Noted: No Moisture No Abnormalities Noted: No Gills, Teandra H. ([id number]) Treatment Notes Wound #2 (Right, Distal Lower Leg) 1. Cleansed with: Clean wound with Normal Saline 2. Anesthetic Topical Lidocaine 4% cream to wound bed prior to debridement 4. Dressing Applied:  Santyl Ointment 5. Secondary Dressing Applied ABD and Kerlix/Conform 7. Secured with International aid/development worker) Signed: 04/26/2015 5:06:41 PM By: Elpidio Eric BSN, RN Entered By: Elpidio Eric on 04/26/2015 08:27:20 Rinehimer, [name] ([id number]) -------------------------------------------------------------------------------- Wound Assessment Details Patient Name: Viscomi, Zaryah H. Date of Service: 04/26/2015 8:00 AM Medical Record Patient Account Number: [id number] Number: Treating RN: Afful, RN, BSN, Rita 07-Nov-1955 [phone] y.o. Other Clinician: Date of Birth/Sex: Female) Treating BURNS III, Primary Care Physician: Fidel Levy Physician/Extender: Zollie Beckers Referring Physician: Jones Broom in Treatment: 12 Wound Status Wound Number: 3 Primary Etiology: Calciphylaxis Wound Location: Left, Lateral Lower Leg Wound Status: Open Wounding Event: Gradually Appeared Date Acquired: 01/09/2015 Weeks Of Treatment: 12 Clustered Wound: No Photos Photo Uploaded By: Elpidio Eric on 04/26/2015 16:36:35 Wound Measurements Length: (cm) 25 Width: (cm) 18 Depth: (cm) 0.5 Area: (cm) 353.429 Volume: (cm) 176.715 % Reduction in Area: -3114.2% % Reduction in Volume: -15965% Wound Description Full Thickness Without Exposed Classification: Support Structures Periwound Skin Texture Texture Color No Abnormalities Noted: No No Abnormalities Noted: No Moisture No Abnormalities Noted: No Broy, Janellie H. ([id number]) Treatment Notes Wound #3  (Left, Lateral Lower Leg) 1. Cleansed with: Clean wound with Normal Saline 2. Anesthetic Topical Lidocaine 4% cream to wound bed prior to debridement 4. Dressing Applied: Santyl Ointment 5. Secondary Dressing Applied ABD and Kerlix/Conform 7. Secured with International aid/development worker) Signed: 04/26/2015 5:06:41 PM By: Elpidio Eric BSN, RN Entered By: Elpidio Eric on 04/26/2015 08:27:20 Malphrus, [name] ([id number]) -------------------------------------------------------------------------------- Wound Assessment Details Patient Name: Mancha, Joshlyn H. Date of Service: 04/26/2015 8:00 AM Medical Record Patient Account Number: [id number] Number: Treating RN: Afful, RN, BSN, Rita [birth year] [phone] y.o. Other Clinician: Date of Birth/Sex: Female) Treating BURNS III, Primary Care Physician: Fidel Levy Physician/Extender: Zollie Beckers Referring Physician: Jones Broom in Treatment: 12 Wound Status Wound Number: 8 Primary Etiology: Calciphylaxis Wound Location: Right, Posterior Lower Leg Wound Status: Open Wounding Event: Gradually Appeared Date Acquired: 03/27/2015 Weeks Of Treatment: 3 Clustered Wound: No Photos Photo Uploaded By: Elpidio Eric on 04/26/2015 16:36:36 Wound Measurements Length: (cm) 6 Width: (cm) 7 Depth: (cm) 0.1 Area: (cm) 32.987 Volume: (cm) 3.299 % Reduction in Area: -1766.8% % Reduction in Volume: -1763.8% Wound Description Classification: Partial Thickness Periwound Skin Texture Texture Color No Abnormalities Noted: No No Abnormalities Noted: No Moisture No Abnormalities Noted: No Treatment Notes Schwarting, Aminta H. ([id number]) Wound #8 (Right, Posterior Lower Leg) 1. Cleansed with: Clean wound with Normal Saline 2. Anesthetic Topical Lidocaine 4% cream to wound bed prior to debridement 4. Dressing Applied: Santyl Ointment 5. Secondary Dressing Applied ABD and Kerlix/Conform 7. Secured  with International aid/development worker) Signed: 04/26/2015 5:06:41 PM By: Elpidio Eric BSN, RN Entered By: Elpidio Eric on 04/26/2015 08:27:20 Brann, [name] ([id number]) -------------------------------------------------------------------------------- Wound Assessment Details Patient Name: Kipp, Alayjah H. Date of Service: 04/26/2015 8:00 AM Medical Record Patient Account Number: [id number] Number: Treating RN: Afful, RN, BSN, Rita 02-06-1956 [phone] y.o. Other Clinician: Date of Birth/Sex: Female) Treating BURNS III, Primary Care Physician: Fidel Levy Physician/Extender: Zollie Beckers Referring Physician: Jones Broom in Treatment: 12 Wound Status Wound Number: 9 Primary Etiology: Calciphylaxis Wound Location: Left Lower Leg - Posterior Wound Status: Open Wounding Event: Gradually Appeared Comorbid History: Hypertension, Type II Diabetes Date Acquired: 03/13/2015 Weeks Of Treatment: 0 Clustered Wound: No Photos Photo Uploaded By: Elpidio Eric on 04/26/2015 17:07:02 Wound Measurements Length: (cm) 5 Width: (cm) 4 Depth: (cm) 0.2 Area: (cm) 15.708 Volume: (cm) 3.142 %  Reduction in Area: 0% % Reduction in Volume: 0% Epithelialization: Small (1-33%) Tunneling: No Undermining: No Wound Description Full Thickness Without Exposed Foul Odor Classification: Support Structures Diabetic Severity Grade 1 (Wagner): Exudate Amount: Medium Exudate Type: Serosanguineous Exudate Color: red, brown After Cleansing: No Wound Bed Granulation Amount: Medium (34-66%) Exposed Structure Rooke, Lavora H. ([id number]) Necrotic Amount: Medium (34-66%) Fascia Exposed: No Necrotic Quality: Adherent Slough Fat Layer Exposed: No Tendon Exposed: No Muscle Exposed: No Joint Exposed: No Bone Exposed: No Limited to Skin Breakdown Periwound Skin Texture Texture Color No Abnormalities Noted: No No Abnormalities Noted: No Callus: No Atrophie Blanche:  No Crepitus: No Cyanosis: No Excoriation: No Ecchymosis: No Fluctuance: No Erythema: No Friable: No Hemosiderin Staining: No Induration: No Mottled: No Localized Edema: Yes Pallor: No Rash: No Rubor: No Scarring: No Temperature / Pain Moisture Temperature: No Abnormality No Abnormalities Noted: No Dry / Scaly: No Maceration: No Moist: Yes Wound Preparation Ulcer Cleansing: Rinsed/Irrigated with Saline Topical Anesthetic Applied: Other: lidocaine 4%, Electronic Signature(s) Signed: 04/26/2015 4:39:28 PM By: Elpidio EricAfful, Rita BSN, RN Entered By: Elpidio EricAfful, Rita on 04/26/2015 16:39:28 Matarese, [name] EdelmanPAMELA H. ([id number]) -------------------------------------------------------------------------------- Vitals Details Patient Name: Konczal, Lariza H. Date of Service: 04/26/2015 8:00 AM Medical Record Patient Account Number: [id number] Number: Treating RN: Afful, RN, BSN, Rita 1955-10-03 [phone](58 y.o. Other Clinician: Date of Birth/Sex: Female) Treating BURNS III, Primary Care Physician: Fidel LevyHAWKINS JR, JAMES Physician/Extender: Zollie BeckersWALTER Referring Physician: Jones BroomHAWKINS JR, JAMES Weeks in Treatment: 12 Vital Signs Time Taken: 08:17 Temperature (F): 97.8 Height (in): 68 Pulse (bpm): 81 Weight (lbs): 294 Respiratory Rate (breaths/min): 19 Body Mass Index (BMI): 44.7 Blood Pressure (mmHg): 105/76 Reference Range: 80 - 120 mg / dl Electronic Signature(s) Signed: 04/26/2015 8:18:21 AM By: Elpidio EricAfful, Rita BSN, RN Entered By: Elpidio EricAfful, Rita on 04/26/2015 08:18:20

## 2015-04-27 ENCOUNTER — Ambulatory Visit
Admission: RE | Admit: 2015-04-27 | Discharge: 2015-04-27 | Disposition: A | Discharge status: Home / Self Care | Payer: BC Managed Care – PPO | Source: Ambulatory Visit | Attending: Vascular Surgery | Admitting: Vascular Surgery

## 2015-04-27 ENCOUNTER — Encounter: Payer: Self-pay | Admitting: *Deleted

## 2015-04-27 ENCOUNTER — Encounter
Admission: RE | Disposition: A | Discharge status: Home / Self Care | Payer: Self-pay | Source: Ambulatory Visit | Attending: Vascular Surgery

## 2015-04-27 DIAGNOSIS — E1122 Type 2 diabetes mellitus with diabetic chronic kidney disease: Secondary | ICD-10-CM | POA: Insufficient documentation

## 2015-04-27 DIAGNOSIS — Y832 Surgical operation with anastomosis, bypass or graft as the cause of abnormal reaction of the patient, or of later complication, without mention of misadventure at the time of the procedure: Secondary | ICD-10-CM | POA: Insufficient documentation

## 2015-04-27 DIAGNOSIS — L97919 Non-pressure chronic ulcer of unspecified part of right lower leg with unspecified severity: Secondary | ICD-10-CM | POA: Insufficient documentation

## 2015-04-27 DIAGNOSIS — Z79899 Other long term (current) drug therapy: Secondary | ICD-10-CM | POA: Insufficient documentation

## 2015-04-27 DIAGNOSIS — L97929 Non-pressure chronic ulcer of unspecified part of left lower leg with unspecified severity: Secondary | ICD-10-CM | POA: Insufficient documentation

## 2015-04-27 DIAGNOSIS — E669 Obesity, unspecified: Secondary | ICD-10-CM | POA: Insufficient documentation

## 2015-04-27 DIAGNOSIS — N186 End stage renal disease: Secondary | ICD-10-CM | POA: Diagnosis not present

## 2015-04-27 DIAGNOSIS — T82858A Stenosis of vascular prosthetic devices, implants and grafts, initial encounter: Secondary | ICD-10-CM | POA: Diagnosis present

## 2015-04-27 DIAGNOSIS — Z992 Dependence on renal dialysis: Secondary | ICD-10-CM | POA: Diagnosis not present

## 2015-04-27 HISTORY — PX: PERIPHERAL VASCULAR CATHETERIZATION: SHX172C

## 2015-04-27 LAB — POTASSIUM (ARMC VASCULAR LAB ONLY): Potassium (ARMC vascular lab): 3.2

## 2015-04-27 SURGERY — A/V SHUNTOGRAM/FISTULAGRAM
Anesthesia: Moderate Sedation

## 2015-04-27 MED ORDER — HEPARIN (PORCINE) IN NACL 2-0.9 UNIT/ML-% IJ SOLN
INTRAMUSCULAR | Status: AC
  Filled 2015-04-27: qty 1000

## 2015-04-27 MED ORDER — LIDOCAINE-EPINEPHRINE (PF) 1 %-1:200000 IJ SOLN
INTRAMUSCULAR | Status: DC | PRN
  Administered 2015-04-27: 10 mL via INTRADERMAL

## 2015-04-27 MED ORDER — MIDAZOLAM HCL 5 MG/5ML IJ SOLN
INTRAMUSCULAR | Status: AC
  Filled 2015-04-27: qty 5

## 2015-04-27 MED ORDER — LIDOCAINE-EPINEPHRINE (PF) 1 %-1:200000 IJ SOLN
INTRAMUSCULAR | Status: AC
  Filled 2015-04-27: qty 30

## 2015-04-27 MED ORDER — MIDAZOLAM HCL 2 MG/2ML IJ SOLN
INTRAMUSCULAR | Status: DC | PRN
  Administered 2015-04-27 (×2): 2 mg via INTRAVENOUS
  Administered 2015-04-27 (×2): 1 mg via INTRAVENOUS
  Administered 2015-04-27 (×2): 2 mg via INTRAVENOUS

## 2015-04-27 MED ORDER — FENTANYL CITRATE (PF) 100 MCG/2ML IJ SOLN
INTRAMUSCULAR | Status: AC
  Filled 2015-04-27: qty 2

## 2015-04-27 MED ORDER — SODIUM CHLORIDE 0.9 % IV SOLN
INTRAVENOUS | Status: DC
  Administered 2015-04-27: 12:00:00 via INTRAVENOUS

## 2015-04-27 MED ORDER — IOHEXOL 300 MG/ML  SOLN
INTRAMUSCULAR | Status: DC | PRN
  Administered 2015-04-27: 40 mL via INTRAVENOUS

## 2015-04-27 MED ORDER — DEXTROSE 5 % IV SOLN: 1.5000 g | INTRAVENOUS | Status: DC

## 2015-04-27 MED ORDER — INSULIN ASPART 100 UNIT/ML ~~LOC~~ SOLN: 0.0000 [IU] | SUBCUTANEOUS | Status: DC

## 2015-04-27 MED ORDER — DEXTROSE 5 % IV SOLN
INTRAVENOUS | Status: AC
  Administered 2015-04-27: 10:00:00
  Filled 2015-04-27: qty 1.5

## 2015-04-27 MED ORDER — CHLORHEXIDINE GLUCONATE CLOTH 2 % EX PADS: 6.0000 | MEDICATED_PAD | Freq: Once | CUTANEOUS | Status: DC

## 2015-04-27 MED ORDER — FENTANYL CITRATE (PF) 100 MCG/2ML IJ SOLN
INTRAMUSCULAR | Status: AC
Start: 2015-04-27 — End: 2015-04-27
  Filled 2015-04-27: qty 2

## 2015-04-27 MED ORDER — HEPARIN SODIUM (PORCINE) 1000 UNIT/ML IJ SOLN
INTRAMUSCULAR | Status: AC
  Filled 2015-04-27: qty 1

## 2015-04-27 MED ORDER — HEPARIN SODIUM (PORCINE) 1000 UNIT/ML IJ SOLN
INTRAMUSCULAR | Status: DC | PRN
  Administered 2015-04-27: 3000 [IU] via INTRAVENOUS

## 2015-04-27 MED ORDER — CEFAZOLIN SODIUM 1-5 GM-% IV SOLN
INTRAVENOUS | Status: AC
  Filled 2015-04-27: qty 50

## 2015-04-27 MED ORDER — FENTANYL CITRATE (PF) 100 MCG/2ML IJ SOLN
INTRAMUSCULAR | Status: DC | PRN
  Administered 2015-04-27 (×5): 50 ug via INTRAVENOUS

## 2015-04-27 SURGICAL SUPPLY — 17 items
BALLN DORADO 8X100X80 (BALLOONS) ×4
BALLOON DORADO 8X100X80 (BALLOONS) ×2 IMPLANT
CANNULA 5F STIFF (CANNULA) ×4 IMPLANT
CATH KUMPE (CATHETERS) ×2
CATH RIM 5FR (CATHETERS) ×4 IMPLANT
CATH SLIP 5FR 0.38 X 40 KMP (CATHETERS) ×2 IMPLANT
COIL MREYE 5X5 (Vascular Products) ×4 IMPLANT
COIL NESTER 14X6 (Vascular Products) ×20 IMPLANT
DEVICE PRESTO INFLATION (MISCELLANEOUS) ×4 IMPLANT
DRAPE BRACHIAL (DRAPES) ×4 IMPLANT
GLIDECATH 4FR STR (CATHETERS) ×4 IMPLANT
GLIDEWIRE ADV .035X260CM (WIRE) ×4 IMPLANT
PACK ANGIOGRAPHY (CUSTOM PROCEDURE TRAY) ×4 IMPLANT
SHEATH BRITE TIP 6FRX5.5 (SHEATH) ×4 IMPLANT
SUT MNCRL AB 4-0 PS2 18 (SUTURE) ×4 IMPLANT
TOWEL OR 17X26 4PK STRL BLUE (TOWEL DISPOSABLE) ×8 IMPLANT
WIRE J 3MM .035X145CM (WIRE) ×4 IMPLANT

## 2015-04-27 NOTE — H&P (Signed)
  [facility] History & Physical Update  The patient was interviewed and re-examined.  The patient's previous History and Physical has been reviewed and is unchanged.  There is no change in the plan of care. We plan to proceed with the scheduled procedure.  DEW,JASON, MD  04/27/2015, 9:35 AM

## 2015-04-27 NOTE — Progress Notes (Signed)
MERRIL, ISAKSON ([id number]) Visit Report for 04/26/2015 Chief Complaint Document Details Patient Name: [name], [name]. Date of Service: 04/26/2015 8:00 AM Medical Record Patient Account Number: [id number] Number: Treating RN: Afful, RN, BSN, Rita 06-15-1956 [phone] y.o. Other Clinician: Date of Birth/Sex: Female) Treating BURNS III, Primary Care Physician/Extender: Karn Cassis Physician: Referring Physician: Jones Broom in Treatment: 12 Information Obtained from: Patient Chief Complaint Bilateral calf ulcers. Electronic Signature(s) Signed: 04/26/2015 3:35:09 PM By: Madelaine Bhat MD Entered By: Madelaine Bhat on 04/26/2015 13:23:05 Bivins, [name] ([id number]) -------------------------------------------------------------------------------- Debridement Details Patient Name: [name], [name] Date of Service: 04/26/2015 8:00 AM Medical Record Patient Account Number: [id number] Number: Treating RN: Afful, RN, BSN, Rita 07/22/1955 [phone] y.o. Other Clinician: Date of Birth/Sex: Female) Treating BURNS III, Primary Care Physician/Extender: Karn Cassis Physician: Referring Physician: Jones Broom in Treatment: 12 Debridement Performed for Wound #3 Left,Lateral Lower Leg Assessment: Performed By: Physician BURNS III, Melanie Crazier., MD Debridement: Debridement Pre-procedure Yes Verification/Time Out Taken: Start Time: 08:40 Pain Control: Lidocaine 4% Topical Solution Level: Skin/Subcutaneous Tissue Total Area Debrided (L x 25 (cm) x 18 (cm) = 450 (cm) W): Tissue and other Viable, Non-Viable, Eschar, Exudate, Fat, Fibrin/Slough, Subcutaneous material debrided: Instrument: Curette, Forceps, Scissors Bleeding: Minimum Hemostasis Achieved: Pressure End Time: 08:45 Procedural Pain: 3 Post Procedural Pain: 3 Response to Treatment: Procedure was tolerated well Post Debridement Measurements of  Total Wound Length: (cm) 25 Width: (cm) 18 Depth: (cm) 0.6 Volume: (cm) 212.058 Post Procedure Diagnosis Same as Pre-procedure Electronic Signature(s) Signed: 04/26/2015 3:35:09 PM By: Madelaine Bhat MD Signed: 04/26/2015 5:06:41 PM By: Elpidio Eric BSN, RN Previous Signature: 04/26/2015 8:41:42 AM Version By: Elpidio Eric BSN, RN Entered By: Madelaine Bhat on 04/26/2015 13:22:22 Ange, Rahel Rexene Edison ([id number]) Froelich, Bevin H. ([id number]) -------------------------------------------------------------------------------- Debridement Details Patient Name: [name], [name] Date of Service: 04/26/2015 8:00 AM Medical Record Patient Account Number: [id number] Number: Treating RN: Afful, RN, BSN, Rita October 16, 1955 [phone] y.o. Other Clinician: Date of Birth/Sex: Female) Treating BURNS III, Primary Care Physician/Extender: Karn Cassis Physician: Referring Physician: Jones Broom in Treatment: 12 Debridement Performed for Wound #8 Right,Posterior Lower Leg Assessment: Performed By: Physician BURNS III, Melanie Crazier., MD Debridement: Debridement Pre-procedure Yes Verification/Time Out Taken: Start Time: 08:40 Pain Control: Lidocaine 4% Topical Solution Level: Skin/Subcutaneous Tissue Total Area Debrided (L x 6 (cm) x 7 (cm) = 42 (cm) W): Tissue and other Viable, Non-Viable, Eschar, Exudate, Fat, Fibrin/Slough, Subcutaneous material debrided: Instrument: Curette, Forceps, Scissors Bleeding: Minimum Hemostasis Achieved: Pressure End Time: 08:45 Procedural Pain: 3 Post Procedural Pain: 3 Response to Treatment: Procedure was tolerated well Post Debridement Measurements of Total Wound Length: (cm) 6 Width: (cm) 7 Depth: (cm) 0.2 Volume: (cm) 6.597 Post Procedure Diagnosis Same as Pre-procedure Electronic Signature(s) Signed: 04/26/2015 3:35:09 PM By: Madelaine Bhat MD Signed: 04/26/2015 5:06:41 PM By: Elpidio Eric BSN,  RN Entered By: Madelaine Bhat on 04/26/2015 13:22:58 Seigler, [name] ([id number]) Martel, Janijah H. ([id number]) -------------------------------------------------------------------------------- HPI Details Patient Name: [name], [name] Date of Service: 04/26/2015 8:00 AM Medical Record Patient Account Number: [id number] Number: Treating RN: Afful, RN, BSN, Rita Nov 02, 1955 [phone] y.o. Other Clinician: Date of Birth/Sex: Female) Treating BURNS III, Primary Care Physician/Extender: Karn Cassis Physician: Referring Physician: Jones Broom in Treatment: 12 History of Present Illness Location: Painful ulcerated areas left lower extremity more than right lower extremity Quality: Patient reports experiencing a sharp  pain to affected area(s). Severity: Patient states wound are getting worse. Duration: Patient has had the wound for < 4 weeks prior to presenting for treatment Timing: Pain in wound is Intermittent (comes and goes Context: The wound appeared gradually over time Modifying Factors: Other treatment(s) tried include: she has had Keflex for about 10 days. Associated Signs and Symptoms: Patient reports having difficulty standing for long periods. HPI Description: 59 year old with h/o DM (Hgb A1c 7.4 in Aug 2016), ESRD (on hemodialysis since July 2016). Presented to her PCP Dr. Venora Maples 01/25/2015 for BLE ulcers since June 2016. Cultures from 03/07/2015 grew Stenotrophomonas maltophilia and enterococcus species sensitive to levofloxacin, Septra, ampicillin. Treated with doxycycline. She was taken to the OR on 03/13/2015 for a angioplasty of her left arm AV fistula. No lower extremity debridement or biopsies. Arterial ultrasound in August 2016 showed no significant peripheral arterial disease. Repeat culture 04/19/2015 grew methicillin sensitive staph aureus (sensitive to tetracycline), Stenotrophomonas maltophilia, and Enterococcus  faecalis. Performing dressing changes with Santyl. Using Tubigrip for edema control. Declined compression bandages, operative debridement and punch biopsy. She complains of malodorous drainage today. No significant pain. No fever or chills. Minimal drainage. Electronic Signature(s) Signed: 04/26/2015 3:35:09 PM By: Madelaine Bhat MD Entered By: Madelaine Bhat on 04/26/2015 13:25:54 Moring, [name] ([id number]) -------------------------------------------------------------------------------- Physical Exam Details Patient Name: [name], [name] Date of Service: 04/26/2015 8:00 AM Medical Record Patient Account Number: [id number] Number: Treating RN: Afful, RN, BSN, Rita 10/26/1955 [phone] y.o. Other Clinician: Date of Birth/Sex: Female) Treating BURNS III, Primary Care Physician/Extender: Karn Cassis Physician: Referring Physician: Fidel Levy Weeks in Treatment: 12 Constitutional . Pulse regular. Respirations normal and unlabored. Afebrile. Marland Kitchen Respiratory WNL. No retractions.. Cardiovascular Pedal Pulses WNL. Integumentary (Hair, Skin) .Marland Kitchen Neurological Sensation normal to touch, pin,and vibration. Psychiatric . Oriented times 3.. No evidence of depression, anxiety, or agitation.. Notes Bilateral calf ulcerations. Full-thickness. Eschar and necrotic fat sharply debrided. No exposed deep structures at this time. Mild cellulitis bilaterally. 2+ pitting edema. Palpable DP bilaterally. Electronic Signature(s) Signed: 04/26/2015 3:35:09 PM By: Madelaine Bhat MD Entered By: Madelaine Bhat on 04/26/2015 13:26:49 Villanueva, [name] ([id number]) -------------------------------------------------------------------------------- Physician Orders Details Patient Name: [name], [name] Date of Service: 04/26/2015 8:00 AM Medical Record Patient Account Number: [id number] Number: Treating RN: Afful, RN, BSN, Rita 1956-03-02 [phone]  y.o. Other Clinician: Date of Birth/Sex: Female) Treating BURNS III, Primary Care Physician/Extender: Karn Cassis Physician: Referring Physician: Jones Broom in Treatment: 12 Verbal / Phone Orders: Yes Clinician: Afful, RN, BSN, Rita Read Back and Verified: Yes Diagnosis Coding Wound Cleansing Wound #1 Right,Proximal Lower Leg o Clean wound with Normal Saline. o May Shower, gently pat wound dry prior to applying new dressing. Wound #2 Right,Distal Lower Leg o Clean wound with Normal Saline. o May Shower, gently pat wound dry prior to applying new dressing. Wound #3 Left,Lateral Lower Leg o Clean wound with Normal Saline. o May Shower, gently pat wound dry prior to applying new dressing. Wound #8 Right,Posterior Lower Leg o Clean wound with Normal Saline. o May Shower, gently pat wound dry prior to applying new dressing. Anesthetic Wound #1 Right,Proximal Lower Leg o Topical Lidocaine 4% cream applied to wound bed prior to debridement Wound #2 Right,Distal Lower Leg o Topical Lidocaine 4% cream applied to wound bed prior to debridement Wound #3 Left,Lateral Lower Leg o Topical Lidocaine 4% cream applied to wound bed prior to debridement Wound #8 Right,Posterior Lower Leg   o Topical Lidocaine 4% cream applied to wound bed prior to debridement Primary Wound Dressing Wound #1 Right,Proximal Lower Leg o Aquacel Ag Harston, Jolleen H. ([id number]) Wound #2 Right,Distal Lower Leg o Aquacel Ag Wound #3 Left,Lateral Lower Leg o Aquacel Ag Wound #8 Right,Posterior Lower Leg o Aquacel Ag Secondary Dressing Wound #1 Right,Proximal Lower Leg o ABD and Kerlix/Conform Wound #3 Left,Lateral Lower Leg o ABD and Kerlix/Conform Wound #8 Right,Posterior Lower Leg o ABD and Kerlix/Conform Dressing Change Frequency Wound #1 Right,Proximal Lower Leg o Change Dressing Monday, Wednesday, Friday Wound #2 Right,Distal Lower  Leg o Change Dressing Monday, Wednesday, Friday Wound #3 Left,Lateral Lower Leg o Change Dressing Monday, Wednesday, Friday Wound #8 Right,Posterior Lower Leg o Change Dressing Monday, Wednesday, Friday Follow-up Appointments Wound #1 Right,Proximal Lower Leg o Return Appointment in 1 week. Wound #2 Right,Distal Lower Leg o Return Appointment in 1 week. Wound #3 Left,Lateral Lower Leg o Return Appointment in 1 week. Wound #8 Right,Posterior Lower Leg o Return Appointment in 1 week. Edema Control Wound #1 Right,Proximal Lower Leg [name], [name] ([id number]) o Patient to wear own Juxtalite/Juzo compression garment. Wound #2 Right,Distal Lower Leg o Patient to wear own Juxtalite/Juzo compression garment. Wound #3 Left,Lateral Lower Leg o Patient to wear own Juxtalite/Juzo compression garment. Wound #8 Right,Posterior Lower Leg o Patient to wear own Juxtalite/Juzo compression garment. Home Health Wound #1 Right,Proximal Lower Leg o Continue Home Health Visits - [facility] o Home Health Nurse may visit PRN to address patientos wound care needs. o FACE TO FACE ENCOUNTER: MEDICARE and MEDICAID PATIENTS: I certify that this patient is under my care and that I had a face-to-face encounter that meets the physician face-to-face encounter requirements with this patient on this date. The encounter with the patient was in whole or in part for the following MEDICAL CONDITION: (primary reason for Home Healthcare) MEDICAL NECESSITY: I certify, that based on my findings, NURSING services are a medically necessary home health service. HOME BOUND STATUS: I certify that my clinical findings support that this patient is homebound (i.e., Due to illness or injury, pt requires aid of supportive devices such as crutches, cane, wheelchairs, walkers, the use of special transportation or the assistance of another person to leave their place of residence. There is a normal  inability to leave the home and doing so requires considerable and taxing effort. Other absences are for medical reasons / religious services and are infrequent or of short duration when for other reasons). o If current dressing causes regression in wound condition, may D/C ordered dressing product/s and apply Normal Saline Moist Dressing daily until next Wound Healing Center / Other MD appointment. Notify Wound Healing Center of regression in wound condition at [phone]. o Please direct any NON-WOUND related issues/requests for orders to patient's Primary Care Physician Wound #2 Right,Distal Lower Leg o Continue Home Health Visits - [facility] o Home Health Nurse may visit PRN to address patientos wound care needs. o FACE TO FACE ENCOUNTER: MEDICARE and MEDICAID PATIENTS: I certify that this patient is under my care and that I had a face-to-face encounter that meets the physician face-to-face encounter requirements with this patient on this date. The encounter with the patient was in whole or in part for the following MEDICAL CONDITION: (primary reason for Home Healthcare) MEDICAL NECESSITY: I certify, that based on my findings, NURSING services are a medically necessary home health service. HOME BOUND STATUS: I certify that my clinical findings support that this patient is homebound (i.e., Due to illness  or injury, pt requires aid of supportive devices such as crutches, cane, wheelchairs, walkers, the use of special transportation or the assistance of another person to leave their place of residence. There is a normal inability to leave the home and doing so requires considerable and taxing effort. Other absences are for medical reasons / religious services and are infrequent or of short duration when for other reasons). [name], [name] ([id number]) o If current dressing causes regression in wound condition, may D/C ordered dressing product/s and apply Normal Saline Moist  Dressing daily until next Wound Healing Center / Other MD appointment. Notify Wound Healing Center of regression in wound condition at [phone]. o Please direct any NON-WOUND related issues/requests for orders to patient's Primary Care Physician Wound #3 Left,Lateral Lower Leg o Continue Home Health Visits - [facility] o Home Health Nurse may visit PRN to address patientos wound care needs. o FACE TO FACE ENCOUNTER: MEDICARE and MEDICAID PATIENTS: I certify that this patient is under my care and that I had a face-to-face encounter that meets the physician face-to-face encounter requirements with this patient on this date. The encounter with the patient was in whole or in part for the following MEDICAL CONDITION: (primary reason for Home Healthcare) MEDICAL NECESSITY: I certify, that based on my findings, NURSING services are a medically necessary home health service. HOME BOUND STATUS: I certify that my clinical findings support that this patient is homebound (i.e., Due to illness or injury, pt requires aid of supportive devices such as crutches, cane, wheelchairs, walkers, the use of special transportation or the assistance of another person to leave their place of residence. There is a normal inability to leave the home and doing so requires considerable and taxing effort. Other absences are for medical reasons / religious services and are infrequent or of short duration when for other reasons). o If current dressing causes regression in wound condition, may D/C ordered dressing product/s and apply Normal Saline Moist Dressing daily until next Wound Healing Center / Other MD appointment. Notify Wound Healing Center of regression in wound condition at [phone]. o Please direct any NON-WOUND related issues/requests for orders to patient's Primary Care Physician Medications-please add to medication list. Wound #1 Right,Proximal Lower Leg o P.O. Antibiotics - Doxy 100mg   BID PO x7days Wound #2 Right,Distal Lower Leg o P.O. Antibiotics - Doxy 100mg  BID PO x7days Wound #3 Left,Lateral Lower Leg o P.O. Antibiotics - Doxy 100mg  BID PO x7days Wound #8 Right,Posterior Lower Leg o P.O. Antibiotics - Doxy 100mg  BID PO x7days Electronic Signature(s) Signed: 04/26/2015 9:09:08 AM By: Elpidio Eric BSN, RN Signed: 04/26/2015 3:35:09 PM By: Madelaine Bhat MD Previous Signature: 04/26/2015 8:47:44 AM Version By: Elpidio Eric BSN, RN Previous Signature: 04/26/2015 8:43:08 AM Version By: Elpidio Eric BSN, RN Entered By: Elpidio Eric on 04/26/2015 09:09:08 Kapur, [name] ([id number]) -------------------------------------------------------------------------------- Prescription 04/26/2015 Patient Name: Kohen, Kourtlynn H. Physician: Madelaine Bhat MD Date of Birth: [birth year] NPI#: [id number] Sex: F DEA#: Phone #: [phone] License #: Patient Address: [facility] [address] [place], [place] [address] [facility] [address] [place], [place] [address] [phone] Allergies Vicodin Physician's Orders P.O. Antibiotics - Doxy 100mg  BID PO x7days Signature(s): Date(s): Electronic Signature(s) Signed: 04/26/2015 3:35:09 PM By: Madelaine Bhat MD Signed: 04/26/2015 5:06:41 PM By: Elpidio Eric BSN, RN Entered By: Elpidio Eric on 04/26/2015 09:09:08 Garlock, [name] ([id number]) --------------------------------------------------------------------------------  Problem List Details Patient Name: Goodnow, Cortasia H. Date of Service:  04/26/2015 8:00 AM Medical Record Patient Account Number: [id number] Number: Treating RN: Afful, RN, BSN, Rita [birth year] [phone] y.o. 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 L97.212 Non-pressure chronic ulcer of right calf with fat layer 01/30/2015 Yes exposed R60.0 Localized edema 04/19/2015 Yes L03.115 Cellulitis of right lower limb 04/26/2015 Yes L03.116 Cellulitis of left lower limb 04/26/2015 Yes Inactive Problems Resolved Problems Electronic Signature(s) Signed: 04/26/2015 3:35:09 PM By: Madelaine Bhat MD Entered By: Madelaine Bhat on 04/26/2015 13:21:59 Wiechmann, [name] ([id number]) Alicea, Daielle Rexene Edison ([id number]) -------------------------------------------------------------------------------- Progress Note Details Patient Name: Ryer, Kaysey H. Date of Service: 04/26/2015 8:00 AM Medical Record Patient Account Number: [id number] Number: Treating RN: Afful, RN, BSN, Rita March 22, 1956 [phone] y.o. Other Clinician: Date of Birth/Sex: Female) Treating BURNS III, Primary Care Physician/Extender: Karn Cassis Physician: Referring Physician: Jones Broom in Treatment: 12 Subjective Chief Complaint Information obtained from Patient Bilateral calf ulcers. History of Present Illness (HPI) The following HPI elements were documented for the patient's wound: Location: Painful ulcerated areas left lower extremity more than right lower extremity Quality: Patient reports experiencing a sharp pain to affected area(s). Severity: Patient states wound are getting worse. Duration: Patient has had the wound for < 4 weeks prior to presenting for treatment Timing: Pain in wound is Intermittent (comes and goes Context: The wound appeared gradually over time Modifying Factors: Other treatment(s) tried include: she has had Keflex for about 10 days. Associated  Signs and Symptoms: Patient reports having difficulty standing for long periods. 59 year old with h/o DM (Hgb A1c 7.4 in Aug 2016), ESRD (on hemodialysis since July 2016). Presented to her PCP Dr. Venora Maples 01/25/2015 for BLE ulcers since June 2016. Cultures from 03/07/2015 grew Stenotrophomonas maltophilia and enterococcus species sensitive to levofloxacin, Septra, ampicillin. Treated with doxycycline. She was taken to the OR on 03/13/2015 for a angioplasty of her left arm AV fistula. No lower extremity debridement or biopsies. Arterial ultrasound in August 2016 showed no significant peripheral arterial disease. Repeat culture 04/19/2015 grew methicillin sensitive staph aureus (sensitive to tetracycline), Stenotrophomonas maltophilia, and Enterococcus faecalis. Performing dressing changes with Santyl. Using Tubigrip for edema control. Declined compression bandages, operative debridement and punch biopsy. She complains of malodorous drainage today. No significant pain. No fever or chills. Minimal drainage. [name], [name] ([id number]) Objective Constitutional Pulse regular. Respirations normal and unlabored. Afebrile. Vitals Time Taken: 8:17 AM, Height: 68 in, Weight: 294 lbs, BMI: 44.7, Temperature: 97.8 F, Pulse: 81 bpm, Respiratory Rate: 19 breaths/min, Blood Pressure: 105/76 mmHg. Respiratory WNL. No retractions.. Cardiovascular Pedal Pulses WNL. Neurological Sensation normal to touch, pin,and vibration. Psychiatric Oriented times 3.. No evidence of depression, anxiety, or agitation.. General Notes: Bilateral calf ulcerations. Full-thickness. Eschar and necrotic fat sharply debrided. No exposed deep structures at this time. Mild cellulitis bilaterally. 2+ pitting edema. Palpable DP bilaterally. Integumentary (Hair, Skin) Wound #1 status is Open. Original cause of wound was Gradually Appeared. The wound is located on the Right,Proximal Lower Leg. The wound measures 2cm length  x 2.5cm width x 0.2cm depth; 3.927cm^2 area and 0.785cm^3 volume. Wound #2 status is Open. Original cause of wound was Gradually Appeared. The  wound is located on the Right,Distal Lower Leg. The wound measures 2.2cm length x 4.5cm width x 0.2cm depth; 7.775cm^2 area and 1.555cm^3 volume. Wound #3 status is Open. Original cause of wound was Gradually Appeared. The wound is located on the Left,Lateral Lower Leg. The wound measures 25cm length x 18cm width x 0.5cm depth; 353.429cm^2 area and 176.715cm^3 volume. Wound #8 status is Open. Original cause of wound was Gradually Appeared. The wound is located on the Right,Posterior Lower Leg. The wound measures 6cm length x 7cm width x 0.1cm depth; 32.987cm^2 area and 3.299cm^3 volume. Assessment [name], [name] ([id number]) Active Problems ICD-10 E11.622 - Type 2 diabetes mellitus with other skin ulcer N18.6 - End stage renal disease L97.222 - Non-pressure chronic ulcer of left calf with fat layer exposed L97.212 - Non-pressure chronic ulcer of right calf with fat layer exposed R60.0 - Localized edema L03.115 - Cellulitis of right lower limb L03.116 - Cellulitis of left lower limb Chronic bilateral calf ulcerations and cellulitis. 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 There was a Skin/Subcutaneous Tissue Debridement (78295-62130) debridement with total area of 450 sq cm performed by BURNS III, Melanie Crazier., MD. with the following instrument(s): Curette, Forceps, and Scissors to remove Viable and Non-Viable tissue/material including Exudate, Fat, Fibrin/Slough, Eschar, and Subcutaneous after achieving pain control using Lidocaine 4% Topical Solution. A time out was conducted prior to the start of the procedure. A Minimum amount of bleeding was controlled with Pressure. The procedure was tolerated well with a pain level of 3 throughout and a pain level of 3 following the procedure. Post  Debridement Measurements: 25cm length x 18cm width x 0.6cm depth; 212.058cm^3 volume. 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 There was a Skin/Subcutaneous Tissue Debridement ([id number]) debridement with total area of 42 sq cm performed by BURNS III, Melanie Crazier., MD. with the following instrument(s): Curette, Forceps, and Scissors to remove Viable and Non-Viable tissue/material including Exudate, Fat, Fibrin/Slough, Eschar, and Subcutaneous after achieving pain control using Lidocaine 4% Topical Solution. A time out was conducted prior to the start of the procedure. A Minimum amount of bleeding was controlled with Pressure. The procedure was tolerated well with a pain level of 3 throughout and a pain level of 3 following the procedure. Post Debridement Measurements: 6cm length x 7cm width x 0.2cm depth; 6.597cm^3 volume. Post procedure Diagnosis Wound #8: Same as Pre-Procedure Plan [name], [name] ([id number]) Wound Cleansing: Wound #1 Right,Proximal Lower Leg: Clean wound with Normal Saline. May Shower, gently pat wound dry prior to applying new dressing. Wound #2 Right,Distal Lower Leg: Clean wound with Normal Saline. May Shower, gently pat wound dry prior to applying new dressing. Wound #3 Left,Lateral Lower Leg: Clean wound with Normal Saline. May Shower, gently pat wound dry prior to applying new dressing. Wound #8 Right,Posterior Lower Leg: Clean wound with Normal Saline. May Shower, gently pat wound dry prior to applying new dressing. Anesthetic: Wound #1 Right,Proximal Lower Leg: Topical Lidocaine 4% cream applied to wound bed prior to debridement Wound #2 Right,Distal Lower Leg: Topical Lidocaine 4% cream applied to wound bed prior to debridement Wound #3 Left,Lateral Lower Leg: Topical Lidocaine 4% cream applied to wound bed prior to debridement Wound #8 Right,Posterior  Lower Leg: Topical Lidocaine 4% cream applied to wound bed prior to debridement Primary Wound Dressing: Wound #1 Right,Proximal Lower Leg: Aquacel Ag Wound #2 Right,Distal  Lower Leg: Aquacel Ag Wound #3 Left,Lateral Lower Leg: Aquacel Ag Wound #8 Right,Posterior Lower Leg: Aquacel Ag Secondary Dressing: Wound #1 Right,Proximal Lower Leg: ABD and Kerlix/Conform Wound #3 Left,Lateral Lower Leg: ABD and Kerlix/Conform Wound #8 Right,Posterior Lower Leg: ABD and Kerlix/Conform Dressing Change Frequency: Wound #1 Right,Proximal Lower Leg: Change Dressing Monday, Wednesday, Friday Wound #2 Right,Distal Lower Leg: Change Dressing Monday, Wednesday, Friday Wound #3 Left,Lateral Lower Leg: Change Dressing Monday, Wednesday, Friday Wound #8 Right,Posterior Lower Leg: Change Dressing Monday, Wednesday, Friday Follow-up Appointments: Wound #1 Right,Proximal Lower Leg: Return Appointment in 1 week. [name], [name] ([id number]) Wound #2 Right,Distal Lower Leg: Return Appointment in 1 week. Wound #3 Left,Lateral Lower Leg: Return Appointment in 1 week. Wound #8 Right,Posterior Lower Leg: Return Appointment in 1 week. Edema Control: Wound #1 Right,Proximal Lower Leg: Patient to wear own Juxtalite/Juzo compression garment. Wound #2 Right,Distal Lower Leg: Patient to wear own Juxtalite/Juzo compression garment. Wound #3 Left,Lateral Lower Leg: Patient to wear own Juxtalite/Juzo compression garment. Wound #8 Right,Posterior Lower Leg: Patient to wear own Juxtalite/Juzo compression garment. Home Health: Wound #1 Right,Proximal Lower Leg: Continue Home Health Visits - [facility] Nurse may visit PRN to address patient s wound care needs. FACE TO FACE ENCOUNTER: MEDICARE and MEDICAID PATIENTS: I certify that this patient is under my care and that I had a face-to-face encounter that meets the physician face-to-face encounter requirements with this patient on this date. The  encounter with the patient was in whole or in part for the following MEDICAL CONDITION: (primary reason for Home Healthcare) MEDICAL NECESSITY: I certify, that based on my findings, NURSING services are a medically necessary home health service. HOME BOUND STATUS: I certify that my clinical findings support that this patient is homebound (i.e., Due to illness or injury, pt requires aid of supportive devices such as crutches, cane, wheelchairs, walkers, the use of special transportation or the assistance of another person to leave their place of residence. There is a normal inability to leave the home and doing so requires considerable and taxing effort. Other absences are for medical reasons / religious services and are infrequent or of short duration when for other reasons). If current dressing causes regression in wound condition, may D/C ordered dressing product/s and apply Normal Saline Moist Dressing daily until next Wound Healing Center / Other MD appointment. Notify Wound Healing Center of regression in wound condition at [phone]. Please direct any NON-WOUND related issues/requests for orders to patient's Primary Care Physician Wound #2 Right,Distal Lower Leg: Continue Home Health Visits - [facility] Nurse may visit PRN to address patient s wound care needs. FACE TO FACE ENCOUNTER: MEDICARE and MEDICAID PATIENTS: I certify that this patient is under my care and that I had a face-to-face encounter that meets the physician face-to-face encounter requirements with this patient on this date. The encounter with the patient was in whole or in part for the following MEDICAL CONDITION: (primary reason for Home Healthcare) MEDICAL NECESSITY: I certify, that based on my findings, NURSING services are a medically necessary home health service. HOME BOUND STATUS: I certify that my clinical findings support that this patient is homebound (i.e., Due to illness or injury, pt requires aid  of supportive devices such as crutches, cane, wheelchairs, walkers, the use of special transportation or the assistance of another person to leave their place of residence. There is a normal inability to leave the home and doing so requires considerable and taxing effort. Other absences are for medical reasons /  religious services and are infrequent or of short duration when for other reasons). If current dressing causes regression in wound condition, may D/C ordered dressing product/s and apply Normal Saline Moist Dressing daily until next Wound Healing Center / Other MD appointment. Notify Wound Healing Center of regression in wound condition at [phone]. Please direct any NON-WOUND related issues/requests for orders to patient's Primary Care Physician Wound #3 Left,Lateral Lower Leg: [name], Farida H. ([id number]) Continue Home Health Visits - [facility] Nurse may visit PRN to address patient s wound care needs. FACE TO FACE ENCOUNTER: MEDICARE and MEDICAID PATIENTS: I certify that this patient is under my care and that I had a face-to-face encounter that meets the physician face-to-face encounter requirements with this patient on this date. The encounter with the patient was in whole or in part for the following MEDICAL CONDITION: (primary reason for Home Healthcare) MEDICAL NECESSITY: I certify, that based on my findings, NURSING services are a medically necessary home health service. HOME BOUND STATUS: I certify that my clinical findings support that this patient is homebound (i.e., Due to illness or injury, pt requires aid of supportive devices such as crutches, cane, wheelchairs, walkers, the use of special transportation or the assistance of another person to leave their place of residence. There is a normal inability to leave the home and doing so requires considerable and taxing effort. Other absences are for medical reasons / religious services and are infrequent or of  short duration when for other reasons). If current dressing causes regression in wound condition, may D/C ordered dressing product/s and apply Normal Saline Moist Dressing daily until next Wound Healing Center / Other MD appointment. Notify Wound Healing Center of regression in wound condition at [phone]. Please direct any NON-WOUND related issues/requests for orders to patient's Primary Care Physician Medications-please add to medication list.: Wound #1 Right,Proximal Lower Leg: P.O. Antibiotics - Doxy 100mg  BID PO x7days Wound #2 Right,Distal Lower Leg: P.O. Antibiotics - Doxy 100mg  BID PO x7days Wound #3 Left,Lateral Lower Leg: P.O. Antibiotics - Doxy 100mg  BID PO x7days Wound #8 Right,Posterior Lower Leg: P.O. Antibiotics - Doxy 100mg  BID PO x7days Doxycycline prescribed. Alternate silver alginate and Santyl. Juxtalite for edema control. Electronic Signature(s) Signed: 04/26/2015 3:35:09 PM By: Madelaine BhatBurns, III, Walter MD Entered By: Madelaine BhatBurns, III, Walter on 04/26/2015 13:28:10 Stalnaker, Aaron EdelmanPAMELA H. ([id number]) -------------------------------------------------------------------------------- SuperBill Details Patient Name: Tortorelli, [place] H. Date of Service: 04/26/2015 Medical Record Patient Account Number: [id number] Number: Treating RN: Clover MealyAfful, RN, BSN, Rita Sep 30, 1955 [phone](58 y.o. 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.212 Non-pressure chronic ulcer of right calf with fat layer exposed R60.0 Localized edema L03.115 Cellulitis of right lower limb L03.116 Cellulitis of left lower limb Facility Procedures CPT4  Code: [id number] Description: 11042 - DEB SUBQ TISSUE 20 SQ CM/< ICD-10 Description Diagnosis E11.622 Type 2 diabetes mellitus with other skin ulcer L97.222 Non-pressure chronic ulcer of left calf with fat l L97.212 Non-pressure chronic ulcer of right calf with fat Modifier: ayer exposed layer exposed Quantity: 1 CPT4 Code: [id number] Description: 11045 - DEB SUBQ TISS EA ADDL 20CM ICD-10 Description Diagnosis L97.222 Non-pressure chronic ulcer of left calf with fat l L97.212 Non-pressure chronic ulcer of right calf with  fat Modifier: ayer exposed layer exposed Quantity: 24 Physician Procedures CPT4 Code: [id number] Spaziani, PAM Description: 99213 - WC PHYS LEVEL 3 - EST PT ICD-10 Description Diagnosis L03.115 Cellulitis of right lower limb L03.116 Cellulitis of left lower limb ELA H. ([id number]) Modifier: Quantity: 1 Electronic Signature(s) Signed: 04/26/2015 3:35:09 PM By: Madelaine Bhat MD Entered By: Madelaine Bhat on 04/26/2015 13:28:43

## 2015-04-27 NOTE — Discharge Instructions (Signed)
Fistulogram, Care After °Refer to this sheet in the next few weeks. These instructions provide you with information on caring for yourself after your procedure. Your health care provider may also give you more specific instructions. Your treatment has been planned according to current medical practices, but problems sometimes occur. Call your health care provider if you have any problems or questions after your procedure. °WHAT TO EXPECT AFTER THE PROCEDURE °After your procedure, it is typical to have the following: °· A small amount of discomfort in the area where the catheters were placed. °· A small amount of bruising around the fistula. °· Sleepiness and fatigue. °HOME CARE INSTRUCTIONS °· Rest at home for the day following your procedure. °· Do not drive or operate heavy machinery while taking pain medicine. °· Take medicines only as directed by your health care provider. °· Do not take baths, swim, or use a hot tub until your health care provider approves. You may shower 24 hours after the procedure or as directed by your health care provider. °· There are many different ways to close and cover an incision, including stitches, skin glue, and adhesive strips. Follow your health care provider's instructions on: °¨ Incision care. °¨ Bandage (dressing) changes and removal. °¨ Incision closure removal. °· Monitor your dialysis fistula carefully. °SEEK MEDICAL CARE IF: °· You have drainage, redness, swelling, or pain at your catheter site. °· You have a fever. °· You have chills. °SEEK IMMEDIATE MEDICAL CARE IF: °· You feel weak. °· You have trouble balancing. °· You have trouble moving your arms or legs. °· You have problems with your speech or vision. °· You can no longer feel a vibration or buzz when you put your fingers over your dialysis fistula. °· The limb that was used for the procedure: °¨ Swells. °¨ Is painful. °¨ Is cold. °¨ Is discolored, such as blue or pale white. °  °This information is not intended  to replace advice given to you by your health care provider. Make sure you discuss any questions you have with your health care provider. °  °Document Released: 11/15/2013 Document Reviewed: 11/15/2013 °Elsevier Interactive Patient Education ©2016 [employer] ° °

## 2015-04-27 NOTE — Op Note (Signed)
[name]    OPERATIVE NOTE   PROCEDURE: 1.   Left brachiocephalic arteriovenous fistula cannulation under ultrasound guidance 2.   Left arm fistulagram including central venogram 3.   Percutaneous transluminal angioplasty of left cephalic vein in the proximal and mid upper arm with 32m diameter by 10 cm length high pressure angioplasty balloon 2 4.   Percutaneous transluminal angioplasty of left cephalic vein subclavian vein confluence with 859mdiameter by 10 cm length high pressure angioplasty balloon 5.   Coil embolization of medium to large cephalic vein branch for competing flow limiting fistula function using one 5 mm diameter by 5 cm length coil and 56 mm diameter by 14 cm length coils  PRE-OPERATIVE DIAGNOSIS: 1. ESRD 2. Poorly functional left brachiocephalic AVF  POST-OPERATIVE DIAGNOSIS: same as above   SURGEON: JaLeotis PainMD  ANESTHESIA: local with MCS  ESTIMATED BLOOD LOSS: Minimal  FINDING(S): 1. Significant narrowing near cephalic vein subclavian vein confluence as well as moderate narrowing in mid and proximal upper arm cephalic vein. Several competing branches of the cephalic vein limiting its maturation and flow  SPECIMEN(S):  None  CONTRAST: 30 cc  INDICATIONS: [name] who presents with malfunctioning  left brachiocephalic arteriovenous fistula.  The patient is scheduled for  left arm fistulagram.  The patient is aware the risks include but are not limited to: bleeding, infection, thrombosis of the cannulated access, and possible anaphylactic reaction to the contrast.  The patient is aware of the risks of the procedure and elects to proceed forward.  DESCRIPTION: After full informed written consent was obtained, the patient was brought back to the angiography suite and placed supine upon the angiography table.  The patient was connected to monitoring equipment.  The  left arm was prepped and draped in the  standard fashion for a percutaneous access intervention.  Under ultrasound guidance, the  left brachiocephalic arteriovenous fistula was cannulated with a micropuncture needle under direct ultrasound guidance near the anastomosis in an antegrade fashion and a permanent image was performed.  The microwire was advanced into the fistula and the needle was exchanged for the a microsheath.  I then upsized to a 6 Fr Sheath and imaging was performed.  Hand injections were completed to image the access including the central venous system. This demonstrated several branches of the cephalic vein causing competing flow.  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  In the mid to proximal upper arm, particularly associated with some of these branches, there was some tortuosity and narrowing of the cephalic vein that appeared to be in the 50-60% range.  Based on the images, this patient will need intervention to improve the fistula function. I then gave the patient 3000 units of intravenous heparin.  I then crossed the stenoses with a Magic Tourqe wire.  Based on the imaging, a 8 mm x 10 cm  high pressure angioplasty balloon was selected.  The balloon was centered around the cephalic vein subclavian vein confluence stenosis and inflated to 20 ATM for 1 minute(s).  On completion imaging, a 20-25 % residual stenosis was present.   I then treated the mid and proximal upper arm cephalic vein with the 74m46miameter by 10 cm length high pressure angioplasty balloon inflated twice to between 14 and 16 atm in each location. This resulted in less than 30% residual stenosis. I selected the largest of the cephalic vein branches today and  was able to cannulate this with the rim catheter and advance well out into the branch with an advantage wire. I exchanged for a 4 French glide catheter and got well out the cephalic vein branch. I elected to coil embolize this branch to try to reduce competitive  flow and improve the flow through the fistula. The first coil I used was a 5 mm diameter short nester coil and then I used five 6 mm diameter by 14 cm length coils to completely packed the branches close to the main fistula as possible. Imaging through the sheath showed excellent occlusion of this branch.   Based on the completion imaging, no further intervention is necessary.  The wire and balloon were removed from the sheath.  A 4-0 Monocryl purse-string suture was sewn around the sheath.  The sheath was removed while tying down the suture.  A sterile bandage was applied to the puncture site.  COMPLICATIONS: None  CONDITION: Stable   [name]  04/27/2015 11:15 AM

## 2015-04-28 ENCOUNTER — Encounter: Payer: Self-pay | Admitting: Vascular Surgery

## 2015-05-03 ENCOUNTER — Encounter: Payer: BC Managed Care – PPO | Admitting: Surgery

## 2015-05-03 DIAGNOSIS — L97222 Non-pressure chronic ulcer of left calf with fat layer exposed: Secondary | ICD-10-CM | POA: Diagnosis not present

## 2015-05-04 NOTE — Progress Notes (Signed)
AYAAN, RINGLE ([id number]) Visit Report for 05/03/2015 Arrival Information Details Patient Name: MADDYX, VALLIE. Date of Service: 05/03/2015 8:00 AM Medical Record Patient Account Number: [id number] Number: Treating RN: Huel Coventry Jul 09, 1956 (59 y.o. Other Clinician: Date of Birth/Sex: Female) Treating BURNS III, Primary Care Physician: Fidel Levy Physician/Extender: Zollie Beckers Referring Physician: Jones Broom in Treatment: 13 Visit Information History Since Last Visit Added or deleted any medications: No Patient Arrived: Ambulatory Any new allergies or adverse reactions: No Arrival Time: 08:04 Had a fall or experienced change in No Accompanied By: self activities of daily living that may affect Transfer Assistance: None risk of falls: Patient Identification Verified: Yes Signs or symptoms of abuse/neglect since last No Secondary Verification Process Yes visito Completed: Hospitalized since last visit: No Patient Has Alerts: Yes Has Dressing in Place as Prescribed: Yes Patient Alerts: DMII Has Compression in Place as Prescribed: Yes ABI Lookingglass Pain Present Now: No Bilateral Electronic Signature(s) Signed: 05/03/2015 4:00:48 PM By: Elliot Gurney, RN, BSN, Kim RN, BSN Entered By: Elliot Gurney, RN, BSN, Kim on 05/03/2015 08:06:25 Reffner, [name] ([id number]) -------------------------------------------------------------------------------- Encounter Discharge Information Details Patient Name: Oser, Ramatoulaye H. Date of Service: 05/03/2015 8:00 AM Medical Record Patient Account Number: [id number] Number: Treating RN: Huel Coventry 11-Jul-1956 (59 y.o. Other Clinician: Date of Birth/Sex: Female) Treating BURNS III, Primary Care Physician: Fidel Levy Physician/Extender: Zollie Beckers Referring Physician: Jones Broom in Treatment: 49 Encounter Discharge Information Items Discharge Pain Level: 1 Discharge Condition:  Stable Ambulatory Status: Ambulatory Discharge Destination: Home Transportation: Private Auto Accompanied By: self Schedule Follow-up Appointment: Yes Medication Reconciliation completed and provided to Patient/Care Yes Shakenna Herrero: Provided on Clinical Summary of Care: 05/03/2015 Form Type Recipient Paper Patient PM Electronic Signature(s) Signed: 05/03/2015 9:14:44 AM By: Gwenlyn Perking Entered By: Gwenlyn Perking on 05/03/2015 09:14:44 Vierling, Devina H. ([id number]) -------------------------------------------------------------------------------- Lower Extremity Assessment Details Patient Name: Cocco, Solymar H. Date of Service: 05/03/2015 8:00 AM Medical Record Patient Account Number: [id number] Number: Treating RN: Huel Coventry [birth year] (59 y.o. Other Clinician: Date of Birth/Sex: Female) Treating BURNS III, Primary Care Physician: Fidel Levy Physician/Extender: Zollie Beckers Referring Physician: Jones Broom in Treatment: 13 Edema Assessment Assessed: [Left: No] [Right: No] E[Left: dema] [Right: :] Calf Left: Right: Point of Measurement: 34 cm From Medial Instep 49.6 cm 46.4 cm Ankle Left: Right: Point of Measurement: 10 cm From Medial Instep 27.2 cm 25.2 cm Vascular Assessment Pulses: Posterior Tibial Dorsalis Pedis Palpable: [Left:Yes] [Right:Yes] Extremity colors, hair growth, and conditions: Extremity Color: [Left:Hyperpigmented] [Right:Hyperpigmented] Hair Growth on Extremity: [Left:No] [Right:No] Temperature of Extremity: [Left:Warm] [Right:Warm] Capillary Refill: [Left:< 3 seconds] [Right:< 3 seconds] Toe Nail Assessment Left: Right: Thick: No No Discolored: No No Deformed: No No Improper Length and Hygiene: No No Electronic Signature(s) Signed: 05/03/2015 4:00:48 PM By: Elliot Gurney, RN, BSN, Kim RN, BSN Entered By: Elliot Gurney, RN, BSN, Kim on 05/03/2015 08:20:20 Flakes, [name] ([id number]) Arth, Kyley H.  ([id number]) -------------------------------------------------------------------------------- Multi Wound Chart Details Patient Name: Ringer, Tashala H. Date of Service: 05/03/2015 8:00 AM Medical Record Patient Account Number: [id number] Number: Treating RN: Huel Coventry 10-Sep-1955 (59 y.o. 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] Comorbid History: [1:Hypertension, Type II Diabetes] [10:Hypertension, Type II Diabetes] [2:N/A] Date Acquired: [1:01/09/2015] [10:05/03/2015] [2:01/09/2015] Weeks of Treatment: [1:13] [10:0] [2:13] Wound Status: [1:Open] [10:Open] [2:Open] Measurements L x W x D 6x5.5x0.6 [10:0.7x0.3x0.1] [2:10x5x0.3] (cm) Area (cm) : [1:25.918] [10:0.165] [2:39.27] Volume (cm) : [1:15.551] [10:0.016] [2:11.781] % Reduction in Area: [1:-55044.70%] [10:0.00%] [2:-41676.60%] % Reduction in Volume: -310920.00% [10:0.00%] [2:-130800.00%] Classification: [1:Full Thickness Without Exposed Support Structures] [10:Grade 1] [2:Full Thickness Without Exposed Support Structures] HBO Classification: [1:Grade 2] [10:N/A] [2:N/A] Exudate Amount: [1:Large] [10:Small] [2:N/A] Exudate Type: [1:Purulent] [10:Serous] [2:N/A] Exudate Color: [1:yellow, brown, green] [10:amber] [2:N/A] Wound Margin: [1:Flat and Intact] [10:Indistinct,  nonvisible] [2:N/A] Granulation Amount: [1:Small (1-33%)] [10:None Present (0%)] [2:N/A] Necrotic Amount: [1:Large (67-100%)] [10:None Present (0%)] [2:N/A] Debridement: [10:N/A] [2:N/A] Debridement ([id number]) Time-Out Taken: Yes N/A N/A Pain Control: Other N/A N/A Tissue Debrided: Necrotic/Eschar, N/A N/A Fibrin/Slough, Fat, Exudates, Subcutaneous Level: Skin/Subcutaneous N/A N/A Tissue Debridement Area (sq 33 N/A N/A cm): Instrument: Curette N/A N/A Bleeding: Minimum N/A N/A Hemostasis Achieved: Pressure N/A N/A Procedural Pain: 3 N/A N/A Post Procedural Pain: 3 N/A N/A Debridement Treatment Procedure was tolerated N/A N/A Response: well Post Debridement 6x5.5x0.8 N/A N/A Measurements L x W x D (cm) Post Debridement 20.735 N/A N/A Volume: (cm) Periwound Skin Texture: Edema: No No Abnormalities Noted No Abnormalities Noted Excoriation: No Induration: No Callus: No Crepitus: No Fluctuance: No Friable: No Rash: No Scarring: No Periwound Skin Maceration: No No Abnormalities Noted No Abnormalities Noted Moisture: Moist: No Dry/Scaly: No Periwound Skin Color: Atrophie Blanche: No Ecchymosis: Yes No Abnormalities Noted Cyanosis: No Ecchymosis: No Erythema: No Hemosiderin Staining: No Mottled: No Pallor: No Rubor: No Temperature: N/A No Abnormality N/A Tenderness on No Yes No Palpation: Wound Preparation: Ulcer Cleansing: Ulcer Cleansing: N/A Rinsed/Irrigated with Rinsed/Irrigated with Saline, Wound Cleanser Saline Weinkauf, Yvette H. ([id number]) Topical Anesthetic Topical Anesthetic Applied: Other: lidocaine Applied: None 4% Procedures Performed: N/A N/A N/A Wound Number: [id number]  Photos: No Photos No Photos No Photos Wound Location: Left, Lateral Lower Leg Right, Posterior Lower Left, Posterior Lower Leg Leg Wounding Event: Gradually Appeared Gradually Appeared Gradually Appeared Primary Etiology: Calciphylaxis Calciphylaxis Calciphylaxis Comorbid  History: N/A N/A N/A Date Acquired: 01/09/2015 03/27/2015 03/13/2015 Weeks of Treatment: 13 4 1  Wound Status: Open Open Open Measurements L x W x D 28x18x1.5 8x8x0.1 3x2.5x0.1 (cm) Area (cm) : 395.841 50.265 5.89 Volume (cm) : 593.761 5.027 0.589 % Reduction in Area: -3499.90% -2744.70% 62.50% % Reduction in Volume: -53878.30% -2740.10% 81.30% Classification: Full Thickness Without Partial Thickness Full Thickness Without Exposed Support Exposed Support Structures Structures HBO Classification: N/A N/A N/A Exudate Amount: N/A N/A N/A Exudate Type: N/A N/A N/A Exudate Color: N/A N/A N/A Wound Margin: N/A N/A N/A Granulation Amount: N/A N/A N/A Necrotic Amount: N/A N/A N/A Exposed Structures: N/A N/A N/A Debridement: Debridement ([id number]- N/A N/A 11047) Time-Out Taken: Yes N/A N/A Pain Control: Other N/A N/A Tissue Debrided: Necrotic/Eschar, N/A N/A Fibrin/Slough, Exudates, Subcutaneous Level: Skin/Subcutaneous N/A N/A Tissue Debridement Area (sq 504 N/A N/A cm): Instrument: Curette N/A N/A Bleeding: Minimum N/A N/A Hemostasis Achieved: Pressure N/A N/A Procedural Pain: 3 N/A N/A Post Procedural Pain: 3 N/A N/A N/A N/A Lukach, Nandi H. ([id number]) Debridement Treatment Procedure was tolerated Response: well Post Debridement 28x18x1.5 N/A N/A Measurements L x W x D (cm) Post Debridement 593.761 N/A N/A Volume: (cm) Periwound Skin Texture: No Abnormalities Noted No  Abnormalities Noted No Abnormalities Noted Periwound Skin No Abnormalities Noted No Abnormalities Noted No Abnormalities Noted Moisture: Periwound Skin Color: No Abnormalities Noted No Abnormalities Noted No Abnormalities Noted Temperature: N/A N/A N/A Tenderness on No No No Palpation: Wound Preparation: N/A N/A N/A Procedures Performed: Debridement N/A N/A Treatment Notes Electronic Signature(s) Signed: 05/03/2015 4:00:48 PM By: Elliot GurneyWoody, RN, BSN, Kim RN, BSN Entered By: Elliot GurneyWoody, RN, BSN, Kim on  05/03/2015 08:48:00 Plott, Aaron EdelmanPAMELA H. ([id number]) -------------------------------------------------------------------------------- Multi-Disciplinary Care Plan Details Patient Name: Boyson, Litha H. Date of Service: 05/03/2015 8:00 AM Medical Record Patient Account Number: [id number] Number: Treating RN: Huel CoventryWoody, Kim May 09, 1956 (58 y.o. Other Clinician: Date of Birth/Sex: Female) Treating BURNS III, Primary Care Physician: Fidel LevyHAWKINS JR, JAMES Physician/Extender: Zollie BeckersWALTER Referring Physician: Jones BroomHAWKINS JR, JAMES Weeks in Treatment: 2813 Active Inactive Orientation to the Wound Care Program Nursing Diagnoses: Knowledge deficit related to the wound healing center program Goals: Patient/caregiver will verbalize understanding of the Wound Healing Center Program Date Initiated: 01/30/2015 Goal Status: Active Interventions: Provide education on orientation to the wound center Notes: Pain, Acute or Chronic Nursing Diagnoses: Pain, acute or chronic: actual or potential Goals: Patient will verbalize adequate pain control and receive pain control interventions during procedures as needed Date Initiated: 01/30/2015 Goal Status: Active Patient/caregiver will verbalize adequate pain control between visits Date Initiated: 01/30/2015 Goal Status: Active Interventions: Assess comfort goal upon admission Encourage patient to take pain medications as prescribed Notes: Venous Leg Ulcer Gates, Nyaja H. ([id number]) Nursing Diagnoses: Potential for venous Insuffiency (use before diagnosis confirmed) Goals: Non-invasive venous studies are completed as ordered Date Initiated: 01/30/2015 Goal Status: Active Interventions: Assess peripheral edema status every visit. Notes: Wound/Skin Impairment Nursing Diagnoses: Impaired tissue integrity Goals: Ulcer/skin breakdown will have a volume reduction of 30% by week 4 Date Initiated: 01/30/2015 Goal Status: Active Ulcer/skin breakdown  will have a volume reduction of 50% by week 8 Date Initiated: 01/30/2015 Goal Status: Active Ulcer/skin breakdown will have a volume reduction of 80% by week 12 Date Initiated: 01/30/2015 Goal Status: Active Ulcer/skin breakdown will heal within 14 weeks Date Initiated: 01/30/2015 Goal Status: Active Interventions: Assess ulceration(s) every visit Notes: Electronic Signature(s) Signed: 05/03/2015 4:00:48 PM By: Elliot GurneyWoody, RN, BSN, Kim RN, BSN Entered By: Elliot GurneyWoody, RN, BSN, Kim on 05/03/2015 08:47:53 Doetsch, Daphane Rexene EdisonH. ([id number]) -------------------------------------------------------------------------------- Pain Assessment Details Patient Name: Bangs, Mckinsley H. Date of Service: 05/03/2015 8:00 AM Medical Record Patient Account Number: [id number] Number: Treating RN: Huel CoventryWoody, Kim May 09, 1956 (58 y.o. Other Clinician: Date of Birth/Sex: Female) Treating BURNS III, Primary Care Physician: Fidel LevyHAWKINS JR, JAMES Physician/Extender: Zollie BeckersWALTER Referring Physician: Jones BroomHAWKINS JR, JAMES Weeks in Treatment: 13 Active Problems Location of Pain Severity and Description of Pain Patient Has Paino No Site Locations Pain Management and Medication Current Pain Management: Electronic Signature(s) Signed: 05/03/2015 4:00:48 PM By: Elliot GurneyWoody, RN, BSN, Kim RN, BSN Entered By: Elliot GurneyWoody, RN, BSN, Kim on 05/03/2015 08:07:03 Woodell, Aaron EdelmanPAMELA H. ([id number]) -------------------------------------------------------------------------------- Patient/Caregiver Education Details Patient Name: Biswas, Tammala H. Date of Service: 05/03/2015 8:00 AM Medical Record Patient Account Number: [id number] Number: Treating RN: Huel CoventryWoody, Kim May 09, 1956 (58 y.o. Other Clinician: Date of Birth/Gender: Female) Treating BURNS III, Primary Care Physician: Fidel LevyHAWKINS JR, JAMES Physician/Extender: Zollie BeckersWALTER Referring Physician: Jones BroomHAWKINS JR, JAMES Weeks in Treatment: 13 Education Assessment Education Provided  To: Patient Education Topics Provided Wound/Skin Impairment: Handouts: Caring for Your Ulcer, Skin Care Do's and Dont's Methods: Demonstration, Explain/Verbal Responses: State content correctly Electronic Signature(s) Signed: 05/03/2015 4:00:48 PM By: Elliot GurneyWoody, RN, BSN, Kim RN, BSN Entered By: Elliot GurneyWoody, RN, BSN,  Kim on 05/03/2015 09:14:42 Lanpher, Beatrice HMarland Kitchen ([id number]) -------------------------------------------------------------------------------- Wound Assessment Details Patient Name: Mesmer, Ellie H. Date of Service: 05/03/2015 8:00 AM Medical Record Patient Account Number: [id number] Number: Treating RN: Huel Coventry 23-Jun-1956 (58 y.o. Other Clinician: Date of Birth/Sex: Female) Treating BURNS III, Primary Care Physician: Fidel Levy Physician/Extender: Zollie Beckers Referring Physician: Jones Broom in Treatment: 13 Wound Status Wound Number: 1 Primary Etiology: Calciphylaxis Wound Location: Right Lower Leg - Proximal Wound Status: Open Wounding Event: Gradually Appeared Comorbid History: Hypertension, Type II Diabetes Date Acquired: 01/09/2015 Weeks Of Treatment: 13 Clustered Wound: No Photos Photo Uploaded By: Elliot Gurney, RN, BSN, Kim on 05/03/2015 16:34:02 Wound Measurements Length: (cm) 6 Width: (cm) 5.5 Depth: (cm) 0.6 Area: (cm) 25.918 Volume: (cm) 15.551 % Reduction in Area: -55044.7% % Reduction in Volume: -310920% Wound Description Full Thickness Without Exposed Classification: Support Structures Diabetic Severity Grade 2 (Wagner): Wound Margin: Flat and Intact Exudate Amount: Large Exudate Type: Purulent Exudate Color: yellow, brown, green Wound Bed [place], Pinky H. ([id number]) Granulation Amount: Small (1-33%) Necrotic Amount: Large (67-100%) Necrotic Quality: Adherent Slough Periwound Skin Texture Texture Color No Abnormalities Noted: No No Abnormalities Noted: No Callus: No Atrophie Blanche: No Crepitus:  No Cyanosis: No Excoriation: No Ecchymosis: No Fluctuance: No Erythema: No Friable: No Hemosiderin Staining: No Induration: No Mottled: No Localized Edema: No Pallor: No Rash: No Rubor: No Scarring: No Moisture No Abnormalities Noted: No Dry / Scaly: No Maceration: No Moist: No Wound Preparation Ulcer Cleansing: Rinsed/Irrigated with Saline, Wound Cleanser Topical Anesthetic Applied: Other: lidocaine 4%, Treatment Notes Wound #1 (Right, Proximal Lower Leg) 1. Cleansed with: Cleanse wound with antibacterial soap and water 2. Anesthetic Topical Lidocaine 4% cream to wound bed prior to debridement 4. Dressing Applied: Other dressing (specify in notes) 5. Secondary Dressing Applied Guaze, ABD and kerlix/Conform 7. Secured with 3 Layer Compression System - Bilateral Notes Profore lite; cutimed sorbact, gauze and abd Electronic Signature(s) Signed: 05/03/2015 4:00:48 PM By: Elliot Gurney, RN, BSN, Kim RN, BSN Entered By: Elliot Gurney, RN, BSN, Kim on 05/03/2015 08:35:27 Neitzke, [name] ([id number]) -------------------------------------------------------------------------------- Wound Assessment Details Patient Name: Cephas, Marice H. Date of Service: 05/03/2015 8:00 AM Medical Record Patient Account Number: [id number] Number: Treating RN: Huel Coventry August 07, 1955 (58 y.o. Other Clinician: Date of Birth/Sex: Female) Treating BURNS III, Primary Care Physician: Fidel Levy Physician/Extender: Zollie Beckers Referring Physician: Jones Broom in Treatment: 13 Wound Status Wound Number: 2 Primary Etiology: Calciphylaxis Wound Location: Right, Distal Lower Leg Wound Status: Open Wounding Event: Gradually Appeared Date Acquired: 01/09/2015 Weeks Of Treatment: 13 Clustered Wound: No Photos Photo Uploaded By: Elliot Gurney, RN, BSN, Kim on 05/03/2015 16:39:27 Wound Measurements Length: (cm) 10 Width: (cm) 5 Depth: (cm) 0.3 Area: (cm) 39.27 Volume: (cm)  11.781 % Reduction in Area: -41676.6% % Reduction in Volume: -130800% Wound Description Full Thickness Without Exposed Classification: Support Structures Raimondo, Pinkey H. ([id number]) Periwound Skin Texture Texture Color No Abnormalities Noted: No No Abnormalities Noted: No Moisture No Abnormalities Noted: No Treatment Notes Wound #2 (Right, Distal Lower Leg) 1. Cleansed with: Cleanse wound with antibacterial soap and water 2. Anesthetic Topical Lidocaine 4% cream to wound bed prior to debridement 4. Dressing Applied: Other dressing (specify in notes) 5. Secondary Dressing Applied Guaze, ABD and kerlix/Conform 7. Secured with 3 Layer Compression System - Bilateral Notes Profore lite; cutimed sorbact, gauze and abd Electronic Signature(s) Signed: 05/03/2015 4:00:48 PM By: Elliot Gurney, RN, BSN, Kim RN, BSN Entered By: Elliot Gurney, RN, BSN, Kim on 05/03/2015 08:37:50 Sia, Makensey  HMarland Kitchen ([id number]) -------------------------------------------------------------------------------- Wound Assessment Details Patient Name: Lagasse, Thersea H. Date of Service: 05/03/2015 8:00 AM Medical Record Patient Account Number: [id number] Number: Treating RN: Huel Coventry 08/22/1955 (58 y.o. Other Clinician: Date of Birth/Sex: Female) Treating BURNS III, Primary Care Physician: Fidel Levy Physician/Extender: Zollie Beckers Referring Physician: Jones Broom in Treatment: 13 Wound Status Wound Number: 3 Primary Etiology: Calciphylaxis Wound Location: Left, Lateral Lower Leg Wound Status: Open Wounding Event: Gradually Appeared Date Acquired: 01/09/2015 Weeks Of Treatment: 13 Clustered Wound: No Photos Photo Uploaded By: Elliot Gurney, RN, BSN, Kim on 05/03/2015 16:39:27 Wound Measurements Length: (cm) 28 Width: (cm) 18 Depth: (cm) 1.5 Area: (cm) 395.841 Volume: (cm) 593.761 % Reduction in Area: -3499.9% % Reduction in Volume: -53878.3% Wound Description Full Thickness  Without Exposed Classification: Support Structures Wiland, Carrell H. ([id number]) Periwound Skin Texture Texture Color No Abnormalities Noted: No No Abnormalities Noted: No Moisture No Abnormalities Noted: No Treatment Notes Wound #3 (Left, Lateral Lower Leg) 1. Cleansed with: Cleanse wound with antibacterial soap and water 2. Anesthetic Topical Lidocaine 4% cream to wound bed prior to debridement 4. Dressing Applied: Other dressing (specify in notes) 5. Secondary Dressing Applied Guaze, ABD and kerlix/Conform 7. Secured with 3 Layer Compression System - Bilateral Notes Profore lite; cutimed sorbact, gauze and abd Electronic Signature(s) Signed: 05/03/2015 4:00:48 PM By: Elliot Gurney, RN, BSN, Kim RN, BSN Entered By: Elliot Gurney, RN, BSN, Kim on 05/03/2015 08:37:50 Orourke, [name] ([id number]) -------------------------------------------------------------------------------- Wound Assessment Details Patient Name: Scheper, Daniah H. Date of Service: 05/03/2015 8:00 AM Medical Record Patient Account Number: [id number] Number: Treating RN: Huel Coventry Jun 01, 1956 (58 y.o. Other Clinician: Date of Birth/Sex: Female) Treating BURNS III, Primary Care Physician: Fidel Levy Physician/Extender: Zollie Beckers Referring Physician: Jones Broom in Treatment: 13 Wound Status Wound Number: 8 Primary Etiology: Calciphylaxis Wound Location: Right, Posterior Lower Leg Wound Status: Open Wounding Event: Gradually Appeared Date Acquired: 03/27/2015 Weeks Of Treatment: 4 Clustered Wound: No Photos Photo Uploaded By: Elliot Gurney, RN, BSN, Kim on 05/03/2015 16:39:47 Wound Measurements Length: (cm) 8 Width: (cm) 8 Depth: (cm) 0.1 Area: (cm) 50.265 Volume: (cm) 5.027 % Reduction in Area: -2744.7% % Reduction in Volume: -2740.1% Wound Description Classification: Partial Thickness Periwound Skin Texture Texture Color No Abnormalities Noted: No No Abnormalities Noted:  No Moisture No Abnormalities Noted: No Treatment Notes Laiche, Kila H. ([id number]) Wound #8 (Right, Posterior Lower Leg) 1. Cleansed with: Cleanse wound with antibacterial soap and water 2. Anesthetic Topical Lidocaine 4% cream to wound bed prior to debridement 4. Dressing Applied: Other dressing (specify in notes) 5. Secondary Dressing Applied Guaze, ABD and kerlix/Conform 7. Secured with 3 Layer Compression System - Bilateral Notes Profore lite; cutimed sorbact, gauze and abd Electronic Signature(s) Signed: 05/03/2015 4:00:48 PM By: Elliot Gurney, RN, BSN, Kim RN, BSN Entered By: Elliot Gurney, RN, BSN, Kim on 05/03/2015 08:37:50 Portman, [name] ([id number]) -------------------------------------------------------------------------------- Wound Assessment Details Patient Name: Clair, Marieta H. Date of Service: 05/03/2015 8:00 AM Medical Record Patient Account Number: [id number] Number: Treating RN: Huel Coventry [birth year] (58 y.o. Other Clinician: Date of Birth/Sex: Female) Treating BURNS III, Primary Care Physician: Fidel Levy Physician/Extender: Zollie Beckers Referring Physician: Jones Broom in Treatment: 13 Wound Status Wound Number: 9 Primary Etiology: Calciphylaxis Wound Location: Left, Posterior Lower Leg Wound Status: Open Wounding Event: Gradually Appeared Date Acquired: 03/13/2015 Weeks Of Treatment: 1 Clustered Wound: No Photos Photo Uploaded By: Elliot Gurney, RN, BSN, Kim on 05/03/2015 16:39:48 Wound Measurements Length: (cm) 3 Width: (cm) 2.5 Depth: (cm) 0.1 Area: (  cm) 5.89 Volume: (cm) 0.589 % Reduction in Area: 62.5% % Reduction in Volume: 81.3% Wound Description Full Thickness Without Exposed Classification: Support Structures Periwound Skin Texture Texture Color No Abnormalities Noted: No No Abnormalities Noted: No Moisture No Abnormalities Noted: No Throckmorton, Auburn H. ([id number]) Treatment Notes Wound #9 (Left, Posterior  Lower Leg) 1. Cleansed with: Cleanse wound with antibacterial soap and water 2. Anesthetic Topical Lidocaine 4% cream to wound bed prior to debridement 4. Dressing Applied: Other dressing (specify in notes) 5. Secondary Dressing Applied Guaze, ABD and kerlix/Conform 7. Secured with 3 Layer Compression System - Bilateral Notes Profore lite; cutimed sorbact, gauze and abd Electronic Signature(s) Signed: 05/03/2015 4:00:48 PM By: Elliot Gurney, RN, BSN, Kim RN, BSN Entered By: Elliot Gurney, RN, BSN, Kim on 05/03/2015 08:37:50 Matty, [name] ([id number]) -------------------------------------------------------------------------------- Vitals Details Patient Name: Moris, Zlata H. Date of Service: 05/03/2015 8:00 AM Medical Record Patient Account Number: [id number] Number: Treating RN: Huel Coventry 1956/01/03 (58 y.o. Other Clinician: Date of Birth/Sex: Female) Treating BURNS III, Primary Care Physician: Fidel Levy Physician/Extender: Zollie Beckers Referring Physician: Jones Broom in Treatment: 13 Vital Signs Time Taken: 08:07 Temperature (F): 97.5 Height (in): 68 Pulse (bpm): 77 Weight (lbs): 294 Respiratory Rate (breaths/min): 18 Body Mass Index (BMI): 44.7 Blood Pressure (mmHg): 142/69 Reference Range: 80 - 120 mg / dl Electronic Signature(s) Signed: 05/03/2015 4:00:48 PM By: Elliot Gurney, RN, BSN, Kim RN, BSN Entered By: Elliot Gurney, RN, BSN, Kim on 05/03/2015 08:65:78

## 2015-05-04 NOTE — Progress Notes (Signed)
DARLYNN, RICCO ([id number]) Visit Report for 05/03/2015 Chief Complaint Document Details Patient Name: [name], [name]. Date of Service: 05/03/2015 8:00 AM Medical Record Patient Account Number: [id number] Number: Treating RN: Huel Coventry 04-01-1956 (59 y.o. Other Clinician: Date of Birth/Sex: Female) Treating BURNS III, Primary Care Physician/Extender: Karn Cassis Physician: Referring Physician: Jones Broom in Treatment: 13 Information Obtained from: Patient Chief Complaint Bilateral calf ulcers. Electronic Signature(s) Signed: 05/03/2015 3:24:17 PM By: Madelaine Bhat MD Entered By: Madelaine Bhat on 05/03/2015 08:56:20 Weatherford, [name] ([id number]) -------------------------------------------------------------------------------- Debridement Details Patient Name: Blank, Kamry H. Date of Service: 05/03/2015 8:00 AM Medical Record Patient Account Number: [id number] Number: Treating RN: Huel Coventry Jul 16, 1955 (59 y.o. Other Clinician: Date of Birth/Sex: Female) Treating BURNS III, Primary Care Physician/Extender: Karn Cassis Physician: Referring Physician: Jones Broom in Treatment: 13 Debridement Performed for Wound #3 Left,Lateral Lower Leg Assessment: Performed By: Physician BURNS III, Melanie Crazier., MD Debridement: Debridement Pre-procedure Yes Verification/Time Out Taken: Start Time: 08:42 Pain Control: Other : lidocaine 4% Level: Skin/Subcutaneous Tissue Total Area Debrided (L x 28 (cm) x 18 (cm) = 504 (cm) W): Tissue and other Viable, Non-Viable, Eschar, Exudate, Fat, Fibrin/Slough, Subcutaneous material debrided: Instrument: Curette Bleeding: Minimum Hemostasis Achieved: Pressure End Time: 08:47 Procedural Pain: 3 Post Procedural Pain: 3 Response to Treatment: Procedure was tolerated well Post Debridement Measurements of Total Wound Length: (cm) 28 Width: (cm)  18 Depth: (cm) 1.6 Volume: (cm) 633.345 Post Procedure Diagnosis Same as Pre-procedure Electronic Signature(s) Signed: 05/03/2015 3:24:17 PM By: Madelaine Bhat MD Signed: 05/03/2015 4:00:48 PM By: Elliot Gurney, RN, BSN, Kim RN, BSN Entered By: Madelaine Bhat on 05/03/2015 09:09:38 Murdaugh, [name] ([id number]) Sausedo, Levetta H. ([id number]) -------------------------------------------------------------------------------- Debridement Details Patient Name: Schwertner, Marya H. Date of Service: 05/03/2015 8:00 AM Medical Record Patient Account Number: [id number] Number: Treating RN: Huel Coventry [birth year] (59 y.o. Other Clinician: Date of Birth/Sex: Female) Treating BURNS III, Primary Care Physician/Extender: Karn Cassis Physician: Referring Physician: Jones Broom in Treatment: 13 Debridement Performed for Wound #1 Right,Proximal Lower Leg Assessment: Performed By: Physician BURNS III, Melanie Crazier., MD Debridement: Debridement Pre-procedure Yes Verification/Time Out Taken: Start Time: 08:42 Pain Control: Other : lidocaine 4% Level: Skin/Subcutaneous Tissue Total Area Debrided (L x 6 (cm) x 5.5 (cm) = 33 (cm) W): Tissue and other Viable, Non-Viable, Eschar, Exudate, Fat, Fibrin/Slough, Subcutaneous material debrided: Instrument: Curette Bleeding: Minimum Hemostasis Achieved: Pressure End Time: 08:47 Procedural Pain: 3 Post Procedural Pain: 3 Response to Treatment: Procedure was tolerated well Post Debridement Measurements of Total Wound Length: (cm) 6 Width: (cm) 5.5 Depth: (cm) 0.8 Volume: (cm) 20.735 Post Procedure Diagnosis Same as Pre-procedure Electronic Signature(s) Signed: 05/03/2015 3:24:17 PM By: Madelaine Bhat MD Signed: 05/03/2015 4:00:48 PM By: Elliot Gurney, RN, BSN, Kim RN, BSN Entered By: Madelaine Bhat on 05/03/2015 09:10:11 Ore, [name] ([id number]) Hisaw, Nialah H.  ([id number]) -------------------------------------------------------------------------------- HPI Details Patient Name: Ferrall, Danetta H. Date of Service: 05/03/2015 8:00 AM Medical Record Patient Account Number: [id number] Number: Treating RN: Huel Coventry 02-19-1956 (59 y.o. Other Clinician: Date of Birth/Sex: Female) Treating BURNS III, Primary Care Physician/Extender: Karn Cassis Physician: Referring Physician: Jones Broom in Treatment: 13 History of Present Illness HPI Description: 59 year old with h/o DM (Hgb A1c 7.4 in Aug 2016), ESRD (on hemodialysis since July 2016). Presented to her PCP Dr. Venora Maples for BLE ulcers since June 2016. Cultures from 03/07/2015 grew Stenotrophomonas maltophilia  and enterococcus species sensitive to levofloxacin, Septra, ampicillin. Treated with doxycycline. She was taken to the OR on 03/13/2015 for a angioplasty of her left arm AV fistula. No lower extremity debridement or biopsies. 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 No record of venous US to assess for venous insufficiency. Repeat culture 04/19/2015 grew methicillin sensitive staph aureus (sensitive to tetracycline), Stenotrophomonas maltophilia, and Enterococcus faecalis. Performing dressing changes with Santyl, alternating with silver alginate. Using juxtalite for edema control. Declined compression bandages (which she has tolerated in the past), operative debridement and punch biopsy. She complains of persistent right calf pain today. No significant pain on the left. No fever or chills. Copious drainage. Electronic Signature(s) Signed: 05/03/2015 3:24:17 PM By: Madelaine Bhat MD Entered By: Madelaine Bhat on 05/03/2015 09:09:08 Wassmer, [name]  ([id number]) -------------------------------------------------------------------------------- Physical Exam Details Patient Name: Dome, Kaytlin H. Date of Service: 05/03/2015 8:00 AM Medical Record Patient Account Number: [id number] Number: Treating RN: Huel Coventry Jun 17, 1956 (59 y.o. Other Clinician: Date of Birth/Sex: Female) Treating BURNS III, Primary Care Physician/Extender: Karn Cassis Physician: Referring Physician: Fidel Levy Weeks in Treatment: 13 Constitutional . Pulse regular. Respirations normal and unlabored. Afebrile. Marland Kitchen Respiratory WNL. No retractions.. Cardiovascular Pedal Pulses WNL. Integumentary (Hair, Skin) .Marland Kitchen Neurological Sensation normal to touch, pin,and vibration. Psychiatric . Oriented times 3.. No evidence of depression, anxiety, or agitation.. Notes Bilateral calf ulcerations. Full-thickness. Eschar and necrotic fat sharply debrided. No exposed deep structures at this time. Cellulitis resolved.2+ pitting edema. Palpable DP bilaterally. Triphasic on the right. Biphasic on the left. Noncompressible. Electronic Signature(s) Signed: 05/03/2015 3:24:17 PM By: Madelaine Bhat MD Entered By: Madelaine Bhat on 05/03/2015 09:00:13 Dejoseph, [name] ([id number]) -------------------------------------------------------------------------------- Physician Orders Details Patient Name: Goldfarb, Harlo H. Date of Service: 05/03/2015 8:00 AM Medical Record Patient Account Number: [id number] Number: Treating RN: Huel Coventry February 24, 1956 (58 y.o. Other Clinician: Date of Birth/Sex: Female) Treating BURNS III, Primary Care Physician/Extender: Karn Cassis Physician: Referring Physician: Jones Broom in Treatment: 64 Verbal / Phone Orders: Yes Clinician: Huel Coventry Read Back and Verified: Yes Diagnosis Coding Wound Cleansing Wound #1 Right,Proximal Lower Leg o Clean wound with Normal  Saline. o Cleanse wound with mild soap and water o May Shower, gently pat wound dry prior to applying new dressing. Wound #2 Right,Distal Lower Leg o Clean wound with Normal Saline. o Cleanse wound with mild soap and water o May Shower, gently pat wound dry prior to applying new dressing. Wound #3 Left,Lateral Lower Leg o Clean wound with Normal Saline. o Cleanse wound with mild soap and water o May Shower, gently pat wound dry prior to applying new dressing. Wound #8 Right,Posterior Lower Leg o Clean wound with Normal Saline. o Cleanse wound with mild soap and water o May Shower, gently pat wound dry prior to applying new dressing. Wound #9 Left,Posterior Lower Leg o Clean wound with Normal Saline. o Cleanse wound with mild soap and water o May Shower, gently pat wound dry prior to applying new dressing. Anesthetic Wound #1 Right,Proximal Lower Leg o Topical Lidocaine 4% cream applied to wound bed prior to debridement Wound #2 Right,Distal Lower Leg o Topical Lidocaine 4% cream applied to wound bed prior to debridement Wound #3 Left,Lateral Lower Leg Axelson, Royal H. ([id number]) o Topical Lidocaine 4% cream applied to wound bed prior to debridement Wound #8 Right,Posterior Lower Leg o  Topical Lidocaine 4% cream applied to wound bed prior to debridement Wound #9 Left,Posterior Lower Leg o Topical Lidocaine 4% cream applied to wound bed prior to debridement Primary Wound Dressing Wound #1 Right,Proximal Lower Leg o Other: - Cutimed Sorbact applied in clinic; Home health may use calcium alginate if Cutimed Sorbact is not available Wound #2 Right,Distal Lower Leg o Other: - Cutimed Sorbact applied in clinic; Home health may use calcium alginate if Cutimed Sorbact is not available Wound #3 Left,Lateral Lower Leg o Other: - Cutimed Sorbact applied in clinic; Home health may use calcium alginate if Cutimed Sorbact is not  available Wound #8 Right,Posterior Lower Leg o Other: - Cutimed Sorbact applied in clinic; Home health may use calcium alginate if Cutimed Sorbact is not available Wound #9 Left,Posterior Lower Leg o Other: - Cutimed Sorbact applied in clinic; Home health may use calcium alginate if Cutimed Sorbact is not available Secondary Dressing Wound #1 Right,Proximal Lower Leg o ABD pad o Dry Gauze Wound #2 Right,Distal Lower Leg o ABD pad o Dry Gauze Wound #3 Left,Lateral Lower Leg o ABD pad o Dry Gauze Wound #8 Right,Posterior Lower Leg o ABD pad o Dry Gauze Wound #9 Left,Posterior Lower Leg o ABD pad o Dry Gauze Lincks, Ed H. ([id number]) Dressing Change Frequency Wound #1 Right,Proximal Lower Leg o Change Dressing Monday, Wednesday, Friday Wound #2 Right,Distal Lower Leg o Change Dressing Monday, Wednesday, Friday Wound #3 Left,Lateral Lower Leg o Change Dressing Monday, Wednesday, Friday Wound #8 Right,Posterior Lower Leg o Change Dressing Monday, Wednesday, Friday Wound #9 Left,Posterior Lower Leg o Change Dressing Monday, Wednesday, Friday Follow-up Appointments Wound #1 Right,Proximal Lower Leg o Return Appointment in 1 week. Wound #2 Right,Distal Lower Leg o Return Appointment in 1 week. Wound #3 Left,Lateral Lower Leg o Return Appointment in 1 week. Wound #8 Right,Posterior Lower Leg o Return Appointment in 1 week. Wound #9 Left,Posterior Lower Leg o Return Appointment in 1 week. Edema Control Wound #1 Right,Proximal Lower Leg o 3 Layer Compression System - Bilateral - Profore lite Wound #2 Right,Distal Lower Leg o 3 Layer Compression System - Bilateral - Profore lite Wound #3 Left,Lateral Lower Leg o 3 Layer Compression System - Bilateral - Profore lite Wound #8 Right,Posterior Lower Leg o 3 Layer Compression System - Bilateral - Profore lite Wound #9 Left,Posterior Lower Leg o 3 Layer Compression  System - Bilateral - Profore lite Defina, Kalliopi H. ([id number]) Home Health Wound #1 Right,Proximal Lower Leg o Continue Home Health Visits - [facility] Wednesday and Friday o Home Health Nurse may visit PRN to address patientos wound care needs. o FACE TO FACE ENCOUNTER: MEDICARE and MEDICAID PATIENTS: I certify that this patient is under my care and that I had a face-to-face encounter that meets the physician face-to-face encounter requirements with this patient on this date. The encounter with the patient was in whole or in part for the following MEDICAL CONDITION: (primary reason for Home Healthcare) MEDICAL NECESSITY: I certify, that based on my findings, NURSING services are a medically necessary home health service. HOME BOUND STATUS: I certify that my clinical findings support that this patient is homebound (i.e., Due to illness or injury, pt requires aid of supportive devices such as crutches, cane, wheelchairs, walkers, the use of special transportation or the assistance of another person to leave their place of residence. There is a normal inability to leave the home and doing so requires considerable and taxing effort. Other absences are for medical reasons / religious services and are  infrequent or of short duration when for other reasons). o If current dressing causes regression in wound condition, may D/C ordered dressing product/s and apply Normal Saline Moist Dressing daily until next Wound Healing Center / Other MD appointment. Notify Wound Healing Center of regression in wound condition at [phone]. o Please direct any NON-WOUND related issues/requests for orders to patient's Primary Care Physician Wound #2 Right,Distal Lower Leg o Continue Home Health Visits - [facility] Wednesday and Friday o Home Health Nurse may visit PRN to address patientos wound care needs. o FACE TO FACE ENCOUNTER: MEDICARE and MEDICAID PATIENTS: I certify that this patient  is under my care and that I had a face-to-face encounter that meets the physician face-to-face encounter requirements with this patient on this date. The encounter with the patient was in whole or in part for the following MEDICAL CONDITION: (primary reason for Home Healthcare) MEDICAL NECESSITY: I certify, that based on my findings, NURSING services are a medically necessary home health service. HOME BOUND STATUS: I certify that my clinical findings support that this patient is homebound (i.e., Due to illness or injury, pt requires aid of supportive devices such as crutches, cane, wheelchairs, walkers, the use of special transportation or the assistance of another person to leave their place of residence. There is a normal inability to leave the home and doing so requires considerable and taxing effort. Other absences are for medical reasons / religious services and are infrequent or of short duration when for other reasons). o If current dressing causes regression in wound condition, may D/C ordered dressing product/s and apply Normal Saline Moist Dressing daily until next Wound Healing Center / Other MD appointment. Notify Wound Healing Center of regression in wound condition at [phone]. o Please direct any NON-WOUND related issues/requests for orders to patient's Primary Care Physician Wound #3 Left,Lateral Lower Leg o Continue Home Health Visits - [facility] Wednesday and Friday o Home Health Nurse may visit PRN to address patientos wound care needs. o FACE TO FACE ENCOUNTER: MEDICARE and MEDICAID PATIENTS: I certify that this patient is under my care and that I had a face-to-face encounter that meets the physician face-to-face encounter requirements with this patient on this date. The encounter with the patient was in [facility], Lyly H. ([id number]) whole or in part for the following MEDICAL CONDITION: (primary reason for Home Healthcare) MEDICAL NECESSITY: I certify, that  based on my findings, NURSING services are a medically necessary home health service. HOME BOUND STATUS: I certify that my clinical findings support that this patient is homebound (i.e., Due to illness or injury, pt requires aid of supportive devices such as crutches, cane, wheelchairs, walkers, the use of special transportation or the assistance of another person to leave their place of residence. There is a normal inability to leave the home and doing so requires considerable and taxing effort. Other absences are for medical reasons / religious services and are infrequent or of short duration when for other reasons). o If current dressing causes regression in wound condition, may D/C ordered dressing product/s and apply Normal Saline Moist Dressing daily until next Wound Healing Center / Other MD appointment. Notify Wound Healing Center of regression in wound condition at [phone]. o Please direct any NON-WOUND related issues/requests for orders to patient's Primary Care Physician Wound #8 Right,Posterior Lower Leg o Continue Home Health Visits - [facility] Wednesday and Friday o Home Health Nurse may visit PRN to address patientos wound care needs. o FACE TO FACE ENCOUNTER: MEDICARE and MEDICAID  PATIENTS: I certify that this patient is under my care and that I had a face-to-face encounter that meets the physician face-to-face encounter requirements with this patient on this date. The encounter with the patient was in whole or in part for the following MEDICAL CONDITION: (primary reason for Home Healthcare) MEDICAL NECESSITY: I certify, that based on my findings, NURSING services are a medically necessary home health service. HOME BOUND STATUS: I certify that my clinical findings support that this patient is homebound (i.e., Due to illness or injury, pt requires aid of supportive devices such as crutches, cane, wheelchairs, walkers, the use of special transportation or the  assistance of another person to leave their place of residence. There is a normal inability to leave the home and doing so requires considerable and taxing effort. Other absences are for medical reasons / religious services and are infrequent or of short duration when for other reasons). o If current dressing causes regression in wound condition, may D/C ordered dressing product/s and apply Normal Saline Moist Dressing daily until next Wound Healing Center / Other MD appointment. Notify Wound Healing Center of regression in wound condition at [phone]. o Please direct any NON-WOUND related issues/requests for orders to patient's Primary Care Physician Wound #9 Left,Posterior Lower Leg o Continue Home Health Visits - [facility] Wednesday and Friday o Home Health Nurse may visit PRN to address patientos wound care needs. o FACE TO FACE ENCOUNTER: MEDICARE and MEDICAID PATIENTS: I certify that this patient is under my care and that I had a face-to-face encounter that meets the physician face-to-face encounter requirements with this patient on this date. The encounter with the patient was in whole or in part for the following MEDICAL CONDITION: (primary reason for Home Healthcare) MEDICAL NECESSITY: I certify, that based on my findings, NURSING services are a medically necessary home health service. HOME BOUND STATUS: I certify that my clinical findings support that this patient is homebound (i.e., Due to illness or injury, pt requires aid of supportive devices such as crutches, cane, wheelchairs, walkers, the use of special transportation or the assistance of another person to leave their place of residence. There is a normal inability to leave the home and doing so requires considerable and taxing effort. Other Feldpausch, Pricella H. ([id number]) absences are for medical reasons / religious services and are infrequent or of short duration when for other reasons). o If current  dressing causes regression in wound condition, may D/C ordered dressing product/s and apply Normal Saline Moist Dressing daily until next Wound Healing Center / Other MD appointment. Notify Wound Healing Center of regression in wound condition at [phone]. o Please direct any NON-WOUND related issues/requests for orders to patient's Primary Care Physician Medications-please add to medication list. Wound #1 Right,Proximal Lower Leg o P.O. Antibiotics - Continue Doxy 100mg  BID PO Wound #2 Right,Distal Lower Leg o P.O. Antibiotics - Continue Doxy 100mg  BID PO Wound #3 Left,Lateral Lower Leg o P.O. Antibiotics - Continue Doxy 100mg  BID PO Wound #8 Right,Posterior Lower Leg o P.O. Antibiotics - Continue Doxy 100mg  BID PO Wound #9 Left,Posterior Lower Leg o P.O. Antibiotics - Continue Doxy 100mg  BID PO Services and Therapies o Venous Studies -Bilateral - [facility] oooo Electronic Signature(s) Signed: 05/03/2015 3:24:17 PM By: Madelaine Bhat MD Signed: 05/03/2015 4:00:48 PM By: Elliot Gurney, RN, BSN, Kim RN, BSN Entered By: Elliot Gurney, RN, BSN, Kim on 05/03/2015 10:40:59 Alridge, [name] ([id number]) -------------------------------------------------------------------------------- Prescription 05/03/2015 Patient Name: Scroggins, Hudsyn H. Physician: Madelaine Bhat MD Date of Birth:  1955-07-18 NPI#: [id number] Sex: F DEA#: Phone #: [phone] License #: Patient Address: [facility] [address] [place], [place] [address] [facility] [address] [place], [place] [address] [phone] Allergies Vicodin Physician's Orders P.O. Antibiotics - Continue Doxy 100mg  BID PO Signature(s): Date(s): Electronic Signature(s) Signed: 05/03/2015 3:24:17 PM By: Madelaine Bhat MD Signed: 05/03/2015 4:00:48 PM By: Elliot Gurney RN, BSN, Kim RN, BSN Entered By: Elliot Gurney, RN, BSN, Kim on 05/03/2015 10:40:59 Postiglione, [name]  ([id number]) --------------------------------------------------------------------------------  Problem List Details Patient Name: Blye, Juleah H. Date of Service: 05/03/2015 8:00 AM Medical Record Patient Account Number: [id number] Number: Treating RN: Huel Coventry 10-09-1955 (58 y.o. 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/30/2015 Yes exposed L97.212 Non-pressure chronic ulcer of right calf with fat layer 01/30/2015 Yes exposed R60.0 Localized edema 04/19/2015 Yes Inactive Problems Resolved Problems ICD-10 Code Description Active Date Resolved Date L03.116 Cellulitis of left lower limb 04/26/2015 04/26/2015 L03.115 Cellulitis of right lower limb 04/26/2015 04/26/2015 Manner, LAURIA DEPOY ([id number]) Electronic Signature(s) Signed: 05/03/2015 3:24:17 PM By: Madelaine Bhat MD Entered By: Madelaine Bhat on 05/03/2015 08:56:05 Pharr, Nelta Rexene Edison ([id number]) -------------------------------------------------------------------------------- Progress Note Details Patient Name: Luse, Yomayra H. Date of Service: 05/03/2015 8:00 AM Medical Record Patient Account Number: [id number] Number: Treating RN: Huel Coventry 1955-10-13 (58 y.o. Other Clinician: Date of Birth/Sex: Female) Treating BURNS III, Primary Care Physician/Extender: Karn Cassis Physician: Referring Physician: Jones Broom in Treatment: 13 Subjective Chief Complaint Information obtained from Patient Bilateral calf ulcers. History of Present Illness  (HPI) 59 year old with h/o DM (Hgb A1c 7.4 in Aug 2016), ESRD (on hemodialysis since July 2016). Presented to her PCP Dr. Venora Maples for BLE ulcers since June 2016. Cultures from 03/07/2015 grew Stenotrophomonas maltophilia and enterococcus species sensitive to levofloxacin, Septra, ampicillin. Treated with doxycycline. She was taken to the OR on 03/13/2015 for a angioplasty of her left arm AV fistula. No lower extremity debridement or biopsies. 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 No record of venous US to assess for venous insufficiency. Repeat culture 04/19/2015 grew methicillin sensitive staph aureus (sensitive to tetracycline), Stenotrophomonas maltophilia, and Enterococcus faecalis. Performing dressing changes with Santyl, alternating with silver alginate. Using juxtalite for edema control. Declined compression bandages (which she has tolerated in the past), operative debridement and punch biopsy. She complains of persistent right calf pain today. No significant pain on the left. No fever or chills. Copious drainage. Objective Constitutional Pulse regular. Respirations normal and unlabored. Afebrile. Vitals Time Taken: 8:07 AM, Height: 68 in, Weight: 294 lbs, BMI: 44.7, Temperature: 97.5 F, Pulse: 77 Schultes, Amaia H. ([id number]) bpm, Respiratory Rate: 18 breaths/min, Blood Pressure: 142/69 mmHg. Respiratory WNL. No retractions.. Cardiovascular Pedal Pulses WNL. Neurological Sensation normal to touch, pin,and vibration. Psychiatric Oriented times 3.. No evidence of depression, anxiety, or agitation.. General Notes: Bilateral calf ulcerations. Full-thickness. Eschar and necrotic fat sharply debrided. No exposed deep structures at this time. Cellulitis resolved.2+ pitting edema. Palpable DP bilaterally. Triphasic on the right. Biphasic on the left.  Noncompressible. Integumentary (Hair,  Skin) Wound #1 status is Open. Original cause of wound was Gradually Appeared. The wound is located on the Right,Proximal Lower Leg. The wound measures 6cm length x 5.5cm width x 0.6cm depth; 25.918cm^2 area and 15.551cm^3 volume. There is a large amount of purulent drainage noted. The wound margin is flat and intact. There is small (1-33%) granulation within the wound bed. There is a large (67-100%) amount of necrotic tissue within the wound bed including Adherent Slough. The periwound skin appearance did not exhibit: Callus, Crepitus, Excoriation, Fluctuance, Friable, Induration, Localized Edema, Rash, Scarring, Dry/Scaly, Maceration, Moist, Atrophie Blanche, Cyanosis, Ecchymosis, Hemosiderin Staining, Mottled, Pallor, Rubor, Erythema. Wound #10 status is Open. Original cause of wound was Trauma. The wound is located on the Left Toe Fourth. The wound measures 0.7cm length x 0.3cm width x 0.1cm depth; 0.165cm^2 area and 0.016cm^3 volume. The wound is limited to skin breakdown. There is a small amount of serous drainage noted. The wound margin is indistinct and nonvisible. There is no granulation within the wound bed. There is no necrotic tissue within the wound bed. The periwound skin appearance exhibited: Ecchymosis. Periwound temperature was noted as No Abnormality. The periwound has tenderness on palpation. Wound #2 status is Open. Original cause of wound was Gradually Appeared. The wound is located on the Right,Distal Lower Leg. The wound measures 10cm length x 5cm width x 0.3cm depth; 39.27cm^2 area and 11.781cm^3 volume. Wound #3 status is Open. Original cause of wound was Gradually Appeared. The wound is located on the Left,Lateral Lower Leg. The wound measures 28cm length x 18cm width x 1.5cm depth; 395.841cm^2 area and 593.761cm^3 volume. Wound #8 status is Open. Original cause of wound was Gradually Appeared. The wound is located on  the Right,Posterior Lower Leg. The wound measures 8cm length x 8cm width x 0.1cm depth; 50.265cm^2 area and 5.027cm^3 volume. Mella, Curstin H. ([id number]) Wound #9 status is Open. Original cause of wound was Gradually Appeared. The wound is located on the Left,Posterior Lower Leg. The wound measures 3cm length x 2.5cm width x 0.1cm depth; 5.89cm^2 area and 0.589cm^3 volume. Assessment Active Problems ICD-10 E11.622 - Type 2 diabetes mellitus with other skin ulcer N18.6 - End stage renal disease L97.222 - Non-pressure chronic ulcer of left calf with fat layer exposed L97.212 - Non-pressure chronic ulcer of right calf with fat layer exposed R60.0 - Localized edema Bilateral calf ulcerations of unclear etiology. 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 There was a Skin/Subcutaneous Tissue Debridement (13244-01027) debridement with total area of 33 sq cm performed by BURNS III, Melanie Crazier., MD. with the following instrument(s): Curette to remove Viable and Non-Viable tissue/material including Exudate, Fat, Fibrin/Slough, Eschar, and Subcutaneous after achieving pain control using Other (lidocaine 4%). A time out was conducted prior to the start of the procedure. A Minimum amount of bleeding was controlled with Pressure. The procedure was tolerated well with a pain level of 3 throughout and a pain level of 3 following the procedure. Post Debridement Measurements: 6cm length x 5.5cm width x 0.8cm depth; 20.735cm^3 volume. 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 There was a Skin/Subcutaneous Tissue Debridement ([id number]) debridement with total area of 504 sq cm performed by BURNS III, Melanie Crazier., MD. with the following instrument(s): Curette to remove Viable and Non-Viable tissue/material including Exudate, Fat, Fibrin/Slough, Eschar, and  Subcutaneous after achieving pain control using Other (lidocaine 4%).  A time out was conducted prior to the start of the procedure. A Minimum amount of bleeding was controlled with Pressure. The procedure was tolerated well with a pain level of 3 throughout and a pain level of 3 following the procedure. Post Debridement Measurements: 28cm length x 18cm width x 1.6cm depth; 633.345cm^3 volume. Post procedure Diagnosis Wound #3: Same as Pre-Procedure Wessman, Rita H. ([id number]) Plan Wound Cleansing: Wound #1 Right,Proximal Lower Leg: Clean wound with Normal Saline. Cleanse wound with mild soap and water May Shower, gently pat wound dry prior to applying new dressing. Wound #10 Left Toe Fourth: Clean wound with Normal Saline. Cleanse wound with mild soap and water May Shower, gently pat wound dry prior to applying new dressing. Wound #2 Right,Distal Lower Leg: Clean wound with Normal Saline. Cleanse wound with mild soap and water May Shower, gently pat wound dry prior to applying new dressing. Wound #3 Left,Lateral Lower Leg: Clean wound with Normal Saline. Cleanse wound with mild soap and water May Shower, gently pat wound dry prior to applying new dressing. Wound #8 Right,Posterior Lower Leg: Clean wound with Normal Saline. Cleanse wound with mild soap and water May Shower, gently pat wound dry prior to applying new dressing. Wound #9 Left,Posterior Lower Leg: Clean wound with Normal Saline. Cleanse wound with mild soap and water May Shower, gently pat wound dry prior to applying new dressing. Anesthetic: Wound #1 Right,Proximal Lower Leg: Topical Lidocaine 4% cream applied to wound bed prior to debridement Wound #10 Left Toe Fourth: Topical Lidocaine 4% cream applied to wound bed prior to debridement Wound #2 Right,Distal Lower Leg: Topical Lidocaine 4% cream applied to wound bed prior to debridement Wound #3 Left,Lateral Lower Leg: Topical Lidocaine 4% cream applied to  wound bed prior to debridement Wound #8 Right,Posterior Lower Leg: Topical Lidocaine 4% cream applied to wound bed prior to debridement Wound #9 Left,Posterior Lower Leg: Topical Lidocaine 4% cream applied to wound bed prior to debridement Primary Wound Dressing: Wound #1 Right,Proximal Lower Leg: Other: - Cutimed Sorbact applied in clinic; Home health may use calcium alginate if Cutimed Sorbact is not available Rowton, Shericka H. ([id number]) Wound #10 Left Toe Fourth: Other: - Cutimed Sorbact applied in clinic; Home health may use calcium alginate if Cutimed Sorbact is not available Wound #2 Right,Distal Lower Leg: Other: - Cutimed Sorbact applied in clinic; Home health may use calcium alginate if Cutimed Sorbact is not available Wound #3 Left,Lateral Lower Leg: Other: - Cutimed Sorbact applied in clinic; Home health may use calcium alginate if Cutimed Sorbact is not available Wound #8 Right,Posterior Lower Leg: Other: - Cutimed Sorbact applied in clinic; Home health may use calcium alginate if Cutimed Sorbact is not available Wound #9 Left,Posterior Lower Leg: Other: - Cutimed Sorbact applied in clinic; Home health may use calcium alginate if Cutimed Sorbact is not available Secondary Dressing: Wound #1 Right,Proximal Lower Leg: Dry Gauze ABD pad Wound #10 Left Toe Fourth: Dry Gauze ABD pad Wound #2 Right,Distal Lower Leg: Dry Gauze ABD pad Wound #3 Left,Lateral Lower Leg: Dry Gauze ABD pad Wound #8 Right,Posterior Lower Leg: Dry Gauze ABD pad Wound #9 Left,Posterior Lower Leg: Dry Gauze ABD pad Dressing Change Frequency: Wound #1 Right,Proximal Lower Leg: Change Dressing Monday, Wednesday, Friday Wound #10 Left Toe Fourth: Change Dressing Monday, Wednesday, Friday Wound #2 Right,Distal Lower Leg: Change Dressing Monday, Wednesday, Friday Wound #3 Left,Lateral Lower Leg: Change Dressing Monday, Wednesday, Friday Wound #8 Right,Posterior Lower Leg: Change  Dressing Monday, Wednesday, Friday Wound #9 Left,Posterior  Lower Leg: Change Dressing Monday, Wednesday, Friday Follow-up Appointments: Wound #1 Right,Proximal Lower Leg: Return Appointment in 1 week. Wound #10 Left Toe Fourth: Teagarden, Medea H. ([id number]) Return Appointment in 1 week. Wound #2 Right,Distal Lower Leg: Return Appointment in 1 week. Wound #3 Left,Lateral Lower Leg: Return Appointment in 1 week. Wound #8 Right,Posterior Lower Leg: Return Appointment in 1 week. Wound #9 Left,Posterior Lower Leg: Return Appointment in 1 week. Edema Control: Wound #1 Right,Proximal Lower Leg: 3 Layer Compression System - Bilateral - Profore lite Wound #10 Left Toe Fourth: 3 Layer Compression System - Bilateral - Profore lite Wound #2 Right,Distal Lower Leg: 3 Layer Compression System - Bilateral - Profore lite Wound #3 Left,Lateral Lower Leg: 3 Layer Compression System - Bilateral - Profore lite Wound #8 Right,Posterior Lower Leg: 3 Layer Compression System - Bilateral - Profore lite Wound #9 Left,Posterior Lower Leg: 3 Layer Compression System - Bilateral - Profore lite Home Health: Wound #1 Right,Proximal Lower Leg: Continue Home Health Visits - [facility] Nurse may visit PRN to address patient s wound care needs. FACE TO FACE ENCOUNTER: MEDICARE and MEDICAID PATIENTS: I certify that this patient is under my care and that I had a face-to-face encounter that meets the physician face-to-face encounter requirements with this patient on this date. The encounter with the patient was in whole or in part for the following MEDICAL CONDITION: (primary reason for Home Healthcare) MEDICAL NECESSITY: I certify, that based on my findings, NURSING services are a medically necessary home health service. HOME BOUND STATUS: I certify that my clinical findings support that this patient is homebound (i.e., Due to illness or injury, pt requires aid of supportive devices such as crutches,  cane, wheelchairs, walkers, the use of special transportation or the assistance of another person to leave their place of residence. There is a normal inability to leave the home and doing so requires considerable and taxing effort. Other absences are for medical reasons / religious services and are infrequent or of short duration when for other reasons). If current dressing causes regression in wound condition, may D/C ordered dressing product/s and apply Normal Saline Moist Dressing daily until next Wound Healing Center / Other MD appointment. Notify Wound Healing Center of regression in wound condition at [phone]. Please direct any NON-WOUND related issues/requests for orders to patient's Primary Care Physician Wound #2 Right,Distal Lower Leg: Continue Home Health Visits - [facility] Nurse may visit PRN to address patient s wound care needs. FACE TO FACE ENCOUNTER: MEDICARE and MEDICAID PATIENTS: I certify that this patient is under my care and that I had a face-to-face encounter that meets the physician face-to-face encounter requirements with this patient on this date. The encounter with the patient was in whole or in part for the following MEDICAL CONDITION: (primary reason for Home Healthcare) MEDICAL NECESSITY: I certify, that based on my findings, NURSING services are a medically necessary home health service. HOME BOUND STATUS: I certify that my clinical findings support that this patient is homebound (i.e., Due to illness or injury, pt requires aid of supportive devices such as crutches, cane, wheelchairs, walkers, the use of special transportation or the assistance of another person to leave their place of residence. There is a Portner, Lyla H. ([id number]) normal inability to leave the home and doing so requires considerable and taxing effort. Other absences are for medical reasons / religious services and are infrequent or of short duration when for other  reasons). If current dressing causes regression in  wound condition, may D/C ordered dressing product/s and apply Normal Saline Moist Dressing daily until next Wound Healing Center / Other MD appointment. Notify Wound Healing Center of regression in wound condition at [phone]. Please direct any NON-WOUND related issues/requests for orders to patient's Primary Care Physician Wound #3 Left,Lateral Lower Leg: Continue Home Health Visits - [facility] Nurse may visit PRN to address patient s wound care needs. FACE TO FACE ENCOUNTER: MEDICARE and MEDICAID PATIENTS: I certify that this patient is under my care and that I had a face-to-face encounter that meets the physician face-to-face encounter requirements with this patient on this date. The encounter with the patient was in whole or in part for the following MEDICAL CONDITION: (primary reason for Home Healthcare) MEDICAL NECESSITY: I certify, that based on my findings, NURSING services are a medically necessary home health service. HOME BOUND STATUS: I certify that my clinical findings support that this patient is homebound (i.e., Due to illness or injury, pt requires aid of supportive devices such as crutches, cane, wheelchairs, walkers, the use of special transportation or the assistance of another person to leave their place of residence. There is a normal inability to leave the home and doing so requires considerable and taxing effort. Other absences are for medical reasons / religious services and are infrequent or of short duration when for other reasons). If current dressing causes regression in wound condition, may D/C ordered dressing product/s and apply Normal Saline Moist Dressing daily until next Wound Healing Center / Other MD appointment. Notify Wound Healing Center of regression in wound condition at [phone]. Please direct any NON-WOUND related issues/requests for orders to patient's Primary Care  Physician Medications-please add to medication list.: Wound #1 Right,Proximal Lower Leg: P.O. Antibiotics - Doxy  BID PO x7days Wound #2 Right,Distal Lower Leg: P.O. Antibiotics - Doxy  BID PO x7days Wound #3 Left,Lateral Lower Leg: P.O. Antibiotics - Doxy  BID PO x7days Wound #8 Right,Posterior Lower Leg: P.O. Antibiotics - Doxy  BID PO x7days She declined punch biopsy today. Willing to resume compression bandages, which she has tolerated in the past. Recommended silver alginate and Profore light elastic compression bandage to be changed 3 times weekly. She has home health. Complete doxycycline. Venous ultrasound to assess for venous insufficiency. MIGNON, BECHLER ([id number]) Electronic Signature(s) Signed: 05/03/2015 3:24:17 PM By: Madelaine Bhat MD Entered By: Madelaine Bhat on 05/03/2015 09:10:48 Farino, [name] ([id number]) -------------------------------------------------------------------------------- SuperBill Details Patient Name: Holmer, Denaya H. Date of Service: 05/03/2015 Medical Record Patient Account Number: [id number] Number: Treating RN: Huel Coventry 1956/04/27 (58 y.o. Other Clinician: Date of Birth/Sex: Female) Treating BURNS III, Primary Care Physician/Extender: Karn Cassis Physician: Weeks in Treatment: 13 Referring Physician: Fidel Levy Diagnosis Coding ICD-10 Codes Code Description [phone] Type 2 diabetes mellitus with other skin ulcer N18.6 End stage renal disease L97.222 Non-pressure chronic ulcer of left calf with fat layer exposed L97.212 Non-pressure chronic ulcer of right calf with fat layer exposed R60.0 Localized edema I87.2 Venous insufficiency (chronic) (peripheral) Facility Procedures CPT4 Code: [id number] Description: 11042 - DEB SUBQ TISSUE 20 SQ CM/< ICD-10 Description Diagnosis L97.222 Non-pressure chronic ulcer of left calf with fat l L97.212 Non-pressure chronic ulcer  of right calf with fat Modifier: ayer exposed layer exposed Quantity: 1 CPT4 Code: [id number] Description: 11045 - DEB SUBQ TISS EA ADDL 20CM ICD-10 Description Diagnosis L97.222 Non-pressure chronic ulcer of left calf with fat l L97.212 Non-pressure chronic ulcer of right calf with fat Modifier: ayer  exposed layer exposed Quantity: 26 Physician Procedures CPT4 Code: 1610960 Description: 99213 - WC PHYS LEVEL 3 - EST PT ICD-10 Description Diagnosis I87.2 Venous insufficiency (chronic) (peripheral) Modifier: Quantity: 1 CPT4 Code: [id number] Nickell, PAM Description: 11042 - WC PHYS SUBQ TISS 20 SQ CM Description ELA H. ([id number]) Modifier: Quantity: 1 Electronic Signature(s) Signed: 05/03/2015 3:24:17 PM By: Madelaine Bhat MD Entered By: Madelaine Bhat on 05/03/2015 09:11:30

## 2015-05-05 ENCOUNTER — Other Ambulatory Visit: Payer: Self-pay | Admitting: Vascular Surgery

## 2015-05-10 ENCOUNTER — Encounter: Payer: BC Managed Care – PPO | Admitting: Surgery

## 2015-05-10 DIAGNOSIS — L97222 Non-pressure chronic ulcer of left calf with fat layer exposed: Secondary | ICD-10-CM | POA: Diagnosis not present

## 2015-05-11 ENCOUNTER — Ambulatory Visit
Admission: RE | Admit: 2015-05-11 | Discharge: 2015-05-11 | Disposition: A | Discharge status: Home / Self Care | Payer: BC Managed Care – PPO | Source: Ambulatory Visit | Attending: Vascular Surgery | Admitting: Vascular Surgery

## 2015-05-11 ENCOUNTER — Encounter
Admission: RE | Disposition: A | Discharge status: Home / Self Care | Payer: Self-pay | Source: Ambulatory Visit | Attending: Vascular Surgery

## 2015-05-11 DIAGNOSIS — L97919 Non-pressure chronic ulcer of unspecified part of right lower leg with unspecified severity: Secondary | ICD-10-CM | POA: Insufficient documentation

## 2015-05-11 DIAGNOSIS — N186 End stage renal disease: Secondary | ICD-10-CM | POA: Insufficient documentation

## 2015-05-11 DIAGNOSIS — E119 Type 2 diabetes mellitus without complications: Secondary | ICD-10-CM | POA: Insufficient documentation

## 2015-05-11 DIAGNOSIS — Z4901 Encounter for fitting and adjustment of extracorporeal dialysis catheter: Secondary | ICD-10-CM | POA: Insufficient documentation

## 2015-05-11 DIAGNOSIS — Z79899 Other long term (current) drug therapy: Secondary | ICD-10-CM | POA: Diagnosis not present

## 2015-05-11 DIAGNOSIS — Z992 Dependence on renal dialysis: Secondary | ICD-10-CM | POA: Insufficient documentation

## 2015-05-11 DIAGNOSIS — E669 Obesity, unspecified: Secondary | ICD-10-CM | POA: Diagnosis not present

## 2015-05-11 HISTORY — PX: PERIPHERAL VASCULAR CATHETERIZATION: SHX172C

## 2015-05-11 SURGERY — DIALYSIS/PERMA CATHETER REMOVAL
Anesthesia: Moderate Sedation

## 2015-05-11 MED ORDER — CHLORHEXIDINE GLUCONATE CLOTH 2 % EX PADS: 6.0000 | MEDICATED_PAD | Freq: Once | CUTANEOUS | Status: DC

## 2015-05-11 MED ORDER — INSULIN ASPART 100 UNIT/ML ~~LOC~~ SOLN: 0.0000 [IU] | SUBCUTANEOUS | Status: DC

## 2015-05-11 SURGICAL SUPPLY — 5 items
FCP FG STRG 5.5XNS LF DISP (INSTRUMENTS) ×1
FORCEPS FG STRG 5.5XNS LF DISP (INSTRUMENTS) ×1 IMPLANT
FORCEPS KELLY 5.5 STR (INSTRUMENTS) ×2
TOWEL OR 17X26 4PK STRL BLUE (TOWEL DISPOSABLE) ×3 IMPLANT
TRAY LACERAT/PLASTIC (MISCELLANEOUS) ×3 IMPLANT

## 2015-05-11 NOTE — Discharge Instructions (Signed)

## 2015-05-11 NOTE — Progress Notes (Signed)
[name], [name] ([id number]) Visit Report for 05/10/2015 Arrival Information Details Patient Name: [name], [name]. Date of Service: 05/10/2015 8:00 AM Medical Record Patient Account Number: [id number] Number: Treating RN: Huel Coventry Apr 22, 1956 (58 y.o. Other Clinician: Date of Birth/Sex: Female) Treating BURNS III, Primary Care Physician: Fidel Levy Physician/Extender: Zollie Beckers Referring Physician: Jones Broom in Treatment: 14 Visit Information History Since Last Visit Added or deleted any medications: No Patient Arrived: Ambulatory Any new allergies or adverse reactions: No Arrival Time: 08:12 Had a fall or experienced change in No Accompanied By: self activities of daily living that may affect Transfer Assistance: Manual risk of falls: Patient Identification Verified: Yes Signs or symptoms of abuse/neglect since last No Secondary Verification Process Yes visito Completed: Hospitalized since last visit: No Patient Has Alerts: Yes Has Dressing in Place as Prescribed: Yes Patient Alerts: DMII Has Compression in Place as Prescribed: Yes ABI [place] Pain Present Now: No Bilateral Electronic Signature(s) Signed: 05/10/2015 5:15:26 PM By: Elliot Gurney, RN, BSN, Kim RN, BSN Entered By: Elliot Gurney, RN, BSN, Kim on 05/10/2015 08:12:58 [place], [name] ([id number]) -------------------------------------------------------------------------------- Encounter Discharge Information Details Patient Name: [name], [name] Date of Service: 05/10/2015 8:00 AM Medical Record Patient Account Number: [id number] Number: Treating RN: Huel Coventry 1955-11-18 (58 y.o. Other Clinician: Date of Birth/Sex: Female) Treating BURNS III, Primary Care Physician: Fidel Levy Physician/Extender: Zollie Beckers Referring Physician: Jones Broom in Treatment: 14 Encounter Discharge Information Items Discharge Pain Level: 0 Discharge Condition:  Stable Ambulatory Status: Ambulatory Discharge Destination: Home Transportation: Private Auto Accompanied By: SELF Schedule Follow-up Appointment: Yes Medication Reconciliation completed and provided to Patient/Care Yes Erich Kochan: Provided on Clinical Summary of Care: 05/10/2015 Form Type Recipient Paper Patient PM Electronic Signature(s) Signed: 05/10/2015 5:15:26 PM By: Elliot Gurney RN, BSN, Kim RN, BSN Previous Signature: 05/10/2015 9:08:29 AM Version By: Gwenlyn Perking Entered By: Elliot Gurney RN, BSN, Kim on 05/10/2015 09:09:20 [name], [name] ([id number]) -------------------------------------------------------------------------------- Lower Extremity Assessment Details Patient Name: [name], [name] Date of Service: 05/10/2015 8:00 AM Medical Record Patient Account Number: [id number] Number: Treating RN: Huel Coventry 07/08/1956 (58 y.o. Other Clinician: Date of Birth/Sex: Female) Treating BURNS III, Primary Care Physician: Fidel Levy Physician/Extender: Zollie Beckers Referring Physician: Jones Broom in Treatment: 14 Edema Assessment Assessed: [Left: No] [Right: No] E[Left: dema] [Right: :] Calf Left: Right: Point of Measurement: 34 cm From Medial Instep 49.5 cm 48 cm Ankle Left: Right: Point of Measurement: 10 cm From Medial Instep 27 cm 25 cm Vascular Assessment Pulses: Posterior Tibial Dorsalis Pedis Palpable: [Left:Yes] [Right:Yes] Extremity colors, hair growth, and conditions: Extremity Color: [Left:Mottled] [Right:Mottled] Hair Growth on Extremity: [Left:No] [Right:No] Temperature of Extremity: [Left:Warm] [Right:Warm] Capillary Refill: [Left:< 3 seconds] [Right:< 3 seconds] Toe Nail Assessment Left: Right: Thick: No No Discolored: No No Deformed: No No Improper Length and Hygiene: No No Electronic Signature(s) Signed: 05/10/2015 5:15:26 PM By: Elliot Gurney, RN, BSN, Kim RN, BSN Entered By: Elliot Gurney, RN, BSN, Kim on 05/10/2015  08:29:26 Smeltz, [name] ([id number]) Davlin, Sanoe H. ([id number]) -------------------------------------------------------------------------------- Multi Wound Chart Details Patient Name: [name], [name] Date of Service: 05/10/2015 8:00 AM Medical Record Patient Account Number: [id number] Number: Treating RN: Huel Coventry 1955/11/22 (58 y.o. 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] [2:Calciphylaxis] [3:Calciphylaxis] Comorbid History: [1:Hypertension, Type II Diabetes] [2:Hypertension, Type II Diabetes] [3:Hypertension, Type II Diabetes] Date Acquired: [1:01/09/2015] [2:01/09/2015] [3:01/09/2015] Weeks of Treatment: [1:14] [2:14] [3:14] Wound Status: [1:Open] [2:Open] [3:Open] Measurements L x W x D 6x5x0.6 [2:10x5x0.3] [3:22.5x11x1.5] (cm) Area (cm) : [1:23.562] [2:39.27] [3:194.386] Volume (cm) : [1:14.137] [2:11.781] [3:291.579] % Reduction in Area: [1:-50031.90%] [2:-41676.60%] [3:-1667.80%] % Reduction in Volume: -282640.00% [2:-130800.00%] [3:-26407.20%] Classification: [1:Full Thickness Without Exposed Support Structures] [2:Full Thickness Without Exposed Support Structures] [3:Full Thickness Without Exposed Support Structures] HBO Classification: [1:Grade 2] [2:Grade 2] [3:Grade 2] Exudate Amount: [1:Large] [2:Large] [3:Large] Exudate Type: [1:Purulent]  [2:Purulent] [3:Purulent] Exudate Color: [1:yellow, brown, green] [2:yellow, brown, green] [3:yellow, brown, green] Foul Odor After [1:N/A] [2:Yes] [3:Yes] Cleansing: Odor Anticipated Due to N/A [2:No] [3:No] Product Use: Wound Margin: [1:Flat and Intact] [2:Indistinct, nonvisible] [3:Indistinct, nonvisible] Granulation Amount: Small (1-33%) None Present (0%) None Present (0%) Necrotic Amount: Large (67-100%) Large (67-100%) Large (67-100%) Necrotic Tissue: Adherent Slough Eschar Adherent Slough Periwound Skin Texture: Edema: No Scarring: Yes Scarring: Yes Excoriation: No Edema: No Induration: No Excoriation: No Callus: No Induration: No Crepitus: No Callus: No Fluctuance: No Crepitus: No Friable: No Fluctuance: No Rash: No Friable: No Scarring: No Rash: No Periwound Skin Maceration: No Maceration: Yes Maceration: Yes Moisture: Moist: No Moist: No Dry/Scaly: No Dry/Scaly: No Periwound Skin Color: Atrophie Blanche: No No Abnormalities Noted Atrophie Blanche: No Cyanosis: No Cyanosis: No Ecchymosis: No Ecchymosis: No Erythema: No Erythema: No Hemosiderin Staining: No Hemosiderin Staining: No Mottled: No Mottled: No Pallor: No Pallor: No Rubor: No Rubor: No Tenderness on No No No Palpation: Wound Preparation: Ulcer Cleansing: Ulcer Cleansing: Ulcer Cleansing: Wound Rinsed/Irrigated with Rinsed/Irrigated with Cleanser Saline, Wound Cleanser Saline Topical Anesthetic Topical Anesthetic Topical Anesthetic Applied: Other: lidocaine Applied: Other: lidocaine Applied: Other: lidocine 4% 4% 4% Wound Number: [id number] N/A Photos: No Photos No Photos N/A Wound Location: Right Lower Leg - Left Lower Leg - Posterior N/A Posterior Wounding Event: Gradually Appeared Gradually Appeared N/A Primary Etiology: Calciphylaxis Calciphylaxis N/A Comorbid History: Hypertension, Type II Hypertension, Type II N/A Diabetes Diabetes Date Acquired: 03/27/2015 03/13/2015 N/A Weeks of  Treatment: 5 2 N/A Wound Status: Open Open N/A Measurements L x W x D 9x9x0.3 2x2.8x0.4 N/A (cm) Area (cm) : 63.617 4.398 N/A Volume (cm) : 19.085 1.759 N/A % Reduction in Area: -3500.30% 72.00% N/A % Reduction in Volume: -10682.50% 44.00% N/A [name], [name] ([id number]) Classification: Full Thickness Without Full Thickness Without N/A Exposed Support Exposed Support Structures Structures HBO Classification: Grade 2 Grade 2 N/A Exudate Amount: Large None Present N/A Exudate Type: Purulent N/A N/A Exudate Color: yellow, brown, green N/A N/A Foul Odor After Yes No N/A Cleansing: Odor Anticipated Due to No N/A N/A Product Use: Wound Margin: Indistinct, nonvisible Flat and Intact N/A Granulation Amount: None Present (0%) None Present (0%) N/A Necrotic Amount: Large (67-100%) Large (67-100%) N/A Necrotic Tissue: Adherent Slough Eschar, Adherent Slough N/A Exposed Structures: Fascia: No Fascia: No N/A Fat: No Fat: No Tendon: No Tendon: No Muscle: No Muscle: No Joint: No Joint: No Bone: No Bone: No Limited to Skin Limited to Skin Breakdown Breakdown Periwound Skin Texture: Scarring: Yes Scarring: Yes N/A Edema: No Edema: No Excoriation: No Excoriation: No Induration: No Induration: No Callus: No Callus: No Crepitus: No Crepitus: No Fluctuance: No Fluctuance: No  Friable: No Friable: No Rash: No Rash: No Periwound Skin Maceration: Yes Moist: Yes N/A Moisture: Moist: Yes Maceration: No Dry/Scaly: No Dry/Scaly: No Periwound Skin Color: Atrophie Blanche: No Atrophie Blanche: No N/A Cyanosis: No Cyanosis: No Ecchymosis: No Ecchymosis: No Erythema: No Erythema: No Hemosiderin Staining: No Hemosiderin Staining: No Mottled: No Mottled: No Pallor: No Pallor: No Rubor: No Rubor: No Tenderness on No No N/A Palpation: Wound Preparation: Ulcer Cleansing: Ulcer Cleansing: Wound N/A Rinsed/Irrigated with Cleanser Saline Topical Anesthetic Topical  Anesthetic [name], [name] ([id number]) Applied: Other: lidcaine Applied: Other: lidociaNE 4% 4% Treatment Notes Electronic Signature(s) Signed: 05/10/2015 5:15:26 PM By: Elliot Gurney, RN, BSN, Kim RN, BSN Entered By: Elliot Gurney, RN, BSN, Kim on 05/10/2015 08:35:12 [name], [name] ([id number]) -------------------------------------------------------------------------------- Multi-Disciplinary Care Plan Details Patient Name: Brazier, Velvia H. Date of Service: 05/10/2015 8:00 AM Medical Record Patient Account Number: [id number] Number: Treating RN: Huel Coventry [birth year] (58 y.o. Other Clinician: Date of Birth/Sex: Female) Treating BURNS III, Primary Care Physician: Fidel Levy Physician/Extender: Zollie Beckers Referring Physician: Jones Broom in Treatment: 14 Active Inactive Orientation to the Wound Care Program Nursing Diagnoses: Knowledge deficit related to the wound healing center program Goals: Patient/caregiver will verbalize understanding of the Wound Healing Center Program Date Initiated: 01/30/2015 Goal Status: Active Interventions: Provide education on orientation to the wound center Notes: Pain, Acute or Chronic Nursing Diagnoses: Pain, acute or chronic: actual or potential Goals: Patient will verbalize adequate pain control and receive pain control interventions during procedures as needed Date Initiated: 01/30/2015 Goal Status: Active Patient/caregiver will verbalize adequate pain control between visits Date Initiated: 01/30/2015 Goal Status: Active Interventions: Assess comfort goal upon admission Encourage patient to take pain medications as prescribed Notes: Venous Leg Ulcer Knipfer, Genasis H. ([id number]) Nursing Diagnoses: Potential for venous Insuffiency (use before diagnosis confirmed) Goals: Non-invasive venous studies are completed as ordered Date Initiated: 01/30/2015 Goal Status: Active Interventions: Assess peripheral edema  status every visit. Notes: Wound/Skin Impairment Nursing Diagnoses: Impaired tissue integrity Goals: Ulcer/skin breakdown will have a volume reduction of 30% by week 4 Date Initiated: 01/30/2015 Goal Status: Active Ulcer/skin breakdown will have a volume reduction of 50% by week 8 Date Initiated: 01/30/2015 Goal Status: Active Ulcer/skin breakdown will have a volume reduction of 80% by week 12 Date Initiated: 01/30/2015 Goal Status: Active Ulcer/skin breakdown will heal within 14 weeks Date Initiated: 01/30/2015 Goal Status: Active Interventions: Assess ulceration(s) every visit Notes: Electronic Signature(s) Signed: 05/10/2015 5:15:26 PM By: Elliot Gurney, RN, BSN, Kim RN, BSN Entered By: Elliot Gurney, RN, BSN, Kim on 05/10/2015 08:35:07 [name], [name] ([id number]) -------------------------------------------------------------------------------- Pain Assessment Details Patient Name: [name], [name] Date of Service: 05/10/2015 8:00 AM Medical Record Patient Account Number: [id number] Number: Treating RN: Huel Coventry 23-Jul-1955 (58 y.o. Other Clinician: Date of Birth/Sex: Female) Treating BURNS III, Primary Care Physician: Fidel Levy Physician/Extender: Zollie Beckers Referring Physician: Jones Broom in Treatment: 14 Active Problems Location of Pain Severity and Description of Pain Patient Has Paino No Site Locations Pain Management and Medication Current Pain Management: Electronic Signature(s) Signed: 05/10/2015 5:15:26 PM By: Elliot Gurney, RN, BSN, Kim RN, BSN Entered By: Elliot Gurney, RN, BSN, Kim on 05/10/2015 08:13:37 [name], [name] ([id number]) -------------------------------------------------------------------------------- Patient/Caregiver Education Details Patient Name: [name], [name] H. Date of Service: 05/10/2015 8:00 AM Medical Record Patient Account Number: [id number] Number: Treating RN: Huel Coventry 1956-03-16 (58 y.o. Other  Clinician: Date of Birth/Gender: Female) Treating BURNS III, Primary Care Physician: Fidel Levy Physician/Extender: Zollie Beckers Referring Physician: Jonna Clark, Grandville Silos  in Treatment: 14 Education Assessment Education Provided To: Patient Education Topics Provided Wound/Skin Impairment: Caring for Your Ulcer, Skin Care Do's and Dont's, Other: CONTINUE WOUND CARE AS Handouts: PRESCRIBED Methods: Demonstration, Explain/Verbal Responses: State content correctly Electronic Signature(s) Signed: 05/10/2015 5:15:26 PM By: Elliot Gurney, RN, BSN, Kim RN, BSN Entered By: Elliot Gurney, RN, BSN, Kim on 05/10/2015 09:09:40 Steinberg, [name] ([id number]) -------------------------------------------------------------------------------- Wound Assessment Details Patient Name: Waller, Kaydie H. Date of Service: 05/10/2015 8:00 AM Medical Record Patient Account Number: [id number] Number: Treating RN: Huel Coventry 21-Aug-1955 (58 y.o. Other Clinician: Date of Birth/Sex: Female) Treating BURNS III, Primary Care Physician: Fidel Levy Physician/Extender: Zollie Beckers Referring Physician: Jones Broom in Treatment: 14 Wound Status Wound Number: 1 Primary Etiology: Calciphylaxis Wound Location: Right Lower Leg - Proximal Wound Status: Open Wounding Event: Gradually Appeared Comorbid History: Hypertension, Type II Diabetes Date Acquired: 01/09/2015 Weeks Of Treatment: 14 Clustered Wound: No Photos Photo Uploaded By: Elliot Gurney, RN, BSN, Kim on 05/10/2015 13:45:48 Wound Measurements Length: (cm) 6 Width: (cm) 5 Depth: (cm) 0.6 Area: (cm) 23.562 Volume: (cm) 14.137 % Reduction in Area: -50031.9% % Reduction in Volume: -282640% Wound Description Full Thickness Without Exposed Classification: Support Structures Grade 2 Rodick, Cabrini H. ([id number]) Diabetic Severity [address]): Wound Margin: Flat and Intact Exudate Amount: Large Exudate Type: Purulent Exudate Color:  yellow, brown, green Wound Bed Granulation Amount: Small (1-33%) Necrotic Amount: Large (67-100%) Necrotic Quality: Adherent Slough Periwound Skin Texture Texture Color No Abnormalities Noted: No No Abnormalities Noted: No Callus: No Atrophie Blanche: No Crepitus: No Cyanosis: No Excoriation: No Ecchymosis: No Fluctuance: No Erythema: No Friable: No Hemosiderin Staining: No Induration: No Mottled: No Localized Edema: No Pallor: No Rash: No Rubor: No Scarring: No Moisture No Abnormalities Noted: No Dry / Scaly: No Maceration: No Moist: No Wound Preparation Ulcer Cleansing: Rinsed/Irrigated with Saline, Wound Cleanser Topical Anesthetic Applied: Other: lidocaine 4%, Treatment Notes Wound #1 (Right, Proximal Lower Leg) 1. Cleansed with: Clean wound with Normal Saline Cleanse wound with antibacterial soap and water 2. Anesthetic Topical Lidocaine 4% cream to wound bed prior to debridement 4. Dressing Applied: Other dressing (specify in notes) 5. Secondary Dressing Applied ABD Pad 7. Secured with GINA, LEBLOND ([id number]) 3 Layer Compression System - Bilateral Notes SORBACT Electronic Signature(s) Signed: 05/10/2015 5:15:26 PM By: Elliot Gurney, RN, BSN, Kim RN, BSN Entered By: Elliot Gurney, RN, BSN, Kim on 05/10/2015 08:31:28 Tool, [name] ([id number]) -------------------------------------------------------------------------------- Wound Assessment Details Patient Name: Vanderford, Khaleelah H. Date of Service: 05/10/2015 8:00 AM Medical Record Patient Account Number: [id number] Number: Treating RN: Huel Coventry March 20, 1956 (58 y.o. Other Clinician: Date of Birth/Sex: Female) Treating BURNS III, Primary Care Physician: Fidel Levy Physician/Extender: Zollie Beckers Referring Physician: Jones Broom in Treatment: 14 Wound Status Wound Number: 2 Primary Etiology: Calciphylaxis Wound Location: Right Lower Leg - Distal Wound Status:  Open Wounding Event: Gradually Appeared Comorbid History: Hypertension, Type II Diabetes Date Acquired: 01/09/2015 Weeks Of Treatment: 14 Clustered Wound: No Photos Photo Uploaded By: Elliot Gurney, RN, BSN, Kim on 05/10/2015 13:45:48 Wound Measurements Length: (cm) 10 Width: (cm) 5 Depth: (cm) 0.3 Area: (cm) 39.27 Volume: (cm) 11.781 % Reduction in Area: -41676.6% % Reduction in Volume: -130800% Wound Description Full Thickness Without Foul Odor After Classification: Exposed Support Structures Due to Product Grade 2 Baskett, Javaya H. ([id number]) Cleansing: Yes Use: No Diabetic Severity [address]): Wound Margin: Indistinct, nonvisible Exudate Amount: Large Exudate Type: Purulent Exudate Color: yellow, brown, green Wound Bed Granulation Amount: None Present (0%) Exposed Structure Necrotic Amount: Large (  67-100%) Fascia Exposed: No Necrotic Quality: Eschar Fat Layer Exposed: No Tendon Exposed: No Muscle Exposed: No Joint Exposed: No Bone Exposed: No Limited to Skin Breakdown Periwound Skin Texture Texture Color No Abnormalities Noted: No No Abnormalities Noted: No Scarring: Yes Moisture No Abnormalities Noted: No Maceration: Yes Wound Preparation Ulcer Cleansing: Rinsed/Irrigated with Saline Topical Anesthetic Applied: Other: lidocine 4%, Treatment Notes Wound #2 (Right, Distal Lower Leg) 1. Cleansed with: Clean wound with Normal Saline Cleanse wound with antibacterial soap and water 2. Anesthetic Topical Lidocaine 4% cream to wound bed prior to debridement 4. Dressing Applied: Other dressing (specify in notes) 5. Secondary Dressing Applied ABD Pad 7. Secured with 3 Layer Compression System - Bilateral Notes Building surveyor) Signed: 05/10/2015 5:15:26 PM By: Elliot Gurney, RN, BSN, Kim RN, BSN Soules, Arthi Rexene Edison ([id number]) Entered By: Elliot Gurney, RN, BSN, Kim on 05/10/2015 40:98:11 Esperanza, [name]  ([id number]) -------------------------------------------------------------------------------- Wound Assessment Details Patient Name: Pytel, Shawnice H. Date of Service: 05/10/2015 8:00 AM Medical Record Patient Account Number: [id number] Number: Treating RN: Huel Coventry 12-Mar-1956 (58 y.o. Other Clinician: Date of Birth/Sex: Female) Treating BURNS III, Primary Care Physician: Fidel Levy Physician/Extender: Zollie Beckers Referring Physician: Jones Broom in Treatment: 14 Wound Status Wound Number: 3 Primary Etiology: Calciphylaxis Wound Location: Left Lower Leg - Lateral Wound Status: Open Wounding Event: Gradually Appeared Comorbid History: Hypertension, Type II Diabetes Date Acquired: 01/09/2015 Weeks Of Treatment: 14 Clustered Wound: No Photos Photo Uploaded By: Elliot Gurney, RN, BSN, Kim on 05/10/2015 13:46:33 Wound Measurements Length: (cm) 22.5 Width: (cm) 11 Depth: (cm) 1.5 Area: (cm) 194.386 Volume: (cm) 291.579 % Reduction in Area: -1667.8% % Reduction in Volume: -26407.2% Wound Description Full Thickness Without Foul Odor After Classification: Exposed Support Structures Due to Product Grade 2 Mccaughan, Kinjal H. ([id number]) Cleansing: Yes Use: No Diabetic Severity [address]): Wound Margin: Indistinct, nonvisible Exudate Amount: Large Exudate Type: Purulent Exudate Color: yellow, brown, green Wound Bed Granulation Amount: None Present (0%) Exposed Structure Necrotic Amount: Large (67-100%) Fascia Exposed: No Necrotic Quality: Adherent Slough Fat Layer Exposed: No Tendon Exposed: No Muscle Exposed: No Joint Exposed: No Bone Exposed: No Limited to Skin Breakdown Periwound Skin Texture Texture Color No Abnormalities Noted: No No Abnormalities Noted: No Callus: No Atrophie Blanche: No Crepitus: No Cyanosis: No Excoriation: No Ecchymosis: No Fluctuance: No Erythema: No Friable: No Hemosiderin Staining: No Induration:  No Mottled: No Localized Edema: No Pallor: No Rash: No Rubor: No Scarring: Yes Moisture No Abnormalities Noted: No Dry / Scaly: No Maceration: Yes Moist: No Wound Preparation Ulcer Cleansing: Wound Cleanser Topical Anesthetic Applied: Other: lidocaine 4%, Treatment Notes Wound #3 (Left, Lateral Lower Leg) 1. Cleansed with: Clean wound with Normal Saline Cleanse wound with antibacterial soap and water 2. Anesthetic Topical Lidocaine 4% cream to wound bed prior to debridement Avitia, Dillie H. ([id number]) 4. Dressing Applied: Other dressing (specify in notes) 5. Secondary Dressing Applied ABD Pad 7. Secured with 3 Layer Compression System - Bilateral Notes Building surveyor) Signed: 05/10/2015 5:15:26 PM By: Elliot Gurney, RN, BSN, Kim RN, BSN Entered By: Elliot Gurney, RN, BSN, Kim on 05/10/2015 08:33:17 Bina, [name] ([id number]) -------------------------------------------------------------------------------- Wound Assessment Details Patient Name: Savant, Alexes H. Date of Service: 05/10/2015 8:00 AM Medical Record Patient Account Number: [id number] Number: Treating RN: Huel Coventry 02-20-1956 (58 y.o. Other Clinician: Date of Birth/Sex: Female) Treating BURNS III, Primary Care Physician: Fidel Levy Physician/Extender: Zollie Beckers Referring Physician: Jones Broom in Treatment: 14 Wound Status Wound Number: 8 Primary Etiology: Calciphylaxis Wound Location:  Right Lower Leg - Posterior Wound Status: Open Wounding Event: Gradually Appeared Comorbid History: Hypertension, Type II Diabetes Date Acquired: 03/27/2015 Weeks Of Treatment: 5 Clustered Wound: No Photos Photo Uploaded By: Elliot GurneyWoody, RN, BSN, Kim on 05/10/2015 13:46:34 Wound Measurements Length: (cm) 9 Width: (cm) 9 Depth: (cm) 0.3 Area: (cm) 63.617 Volume: (cm) 19.085 % Reduction in Area: -3500.3% % Reduction in Volume: -10682.5% Wound Description Full Thickness  Without Foul Odor After Classification: Exposed Support Structures Due to Product Grade 2 Yuille, Kenlynn H. ([id number]) Cleansing: Yes Use: No Diabetic Severity [address]([place]): Wound Margin: Indistinct, nonvisible Exudate Amount: Large Exudate Type: Purulent Exudate Color: yellow, brown, green Wound Bed Granulation Amount: None Present (0%) Exposed Structure Necrotic Amount: Large (67-100%) Fascia Exposed: No Necrotic Quality: Adherent Slough Fat Layer Exposed: No Tendon Exposed: No Muscle Exposed: No Joint Exposed: No Bone Exposed: No Limited to Skin Breakdown Periwound Skin Texture Texture Color No Abnormalities Noted: No No Abnormalities Noted: No Callus: No Atrophie Blanche: No Crepitus: No Cyanosis: No Excoriation: No Ecchymosis: No Fluctuance: No Erythema: No Friable: No Hemosiderin Staining: No Induration: No Mottled: No Localized Edema: No Pallor: No Rash: No Rubor: No Scarring: Yes Moisture No Abnormalities Noted: No Dry / Scaly: No Maceration: Yes Moist: Yes Wound Preparation Ulcer Cleansing: Rinsed/Irrigated with Saline Topical Anesthetic Applied: Other: lidcaine 4%, Treatment Notes Wound #8 (Right, Posterior Lower Leg) 1. Cleansed with: Clean wound with Normal Saline Cleanse wound with antibacterial soap and water 2. Anesthetic Topical Lidocaine 4% cream to wound bed prior to debridement Burbano, Zabdi H. ([id number]) 4. Dressing Applied: Other dressing (specify in notes) 5. Secondary Dressing Applied ABD Pad 7. Secured with 3 Layer Compression System - Bilateral Notes [employer] Electronic Signature(s) Signed: 05/10/2015 5:15:26 PM By: Elliot GurneyWoody, RN, BSN, Kim RN, BSN Entered By: Elliot GurneyWoody, RN, BSN, Kim on 05/10/2015 08:34:07 Quesnel, [name] EdelmanPAMELA H. ([id number]) -------------------------------------------------------------------------------- Wound Assessment Details Patient Name: Tomassi, Terre H. Date of Service: 05/10/2015 8:00 AM Medical  Record Patient Account Number: [id number] Number: Treating RN: Huel CoventryWoody, Kim [birth year] (58 y.o. Other Clinician: Date of Birth/Sex: Female) Treating BURNS III, Primary Care Physician: Fidel LevyHAWKINS JR, JAMES Physician/Extender: Zollie BeckersWALTER Referring Physician: Jones BroomHAWKINS JR, JAMES Weeks in Treatment: 14 Wound Status Wound Number: 9 Primary Etiology: Calciphylaxis Wound Location: Left Lower Leg - Posterior Wound Status: Open Wounding Event: Gradually Appeared Comorbid History: Hypertension, Type II Diabetes Date Acquired: 03/13/2015 Weeks Of Treatment: 2 Clustered Wound: No Photos Photo Uploaded By: Elliot GurneyWoody, RN, BSN, Kim on 05/10/2015 13:46:57 Wound Measurements Length: (cm) 2 Width: (cm) 2.8 Depth: (cm) 0.4 Area: (cm) 4.398 Volume: (cm) 1.759 % Reduction in Area: 72% % Reduction in Volume: 44% Wound Description Full Thickness Without Classification: Exposed Support Structures Diabetic Severity Grade 2 (Wagner): Wound Margin: Flat and Intact Exudate Amount: None Present Foul Odor After Cleansing: No Wound Bed Granulation Amount: None Present (0%) Exposed Structure Necrotic Amount: Large (67-100%) Fascia Exposed: No Wainwright, Jaleiyah H. ([id number]) Necrotic Quality: Eschar, Adherent Slough Fat Layer Exposed: No Tendon Exposed: No Muscle Exposed: No Joint Exposed: No Bone Exposed: No Limited to Skin Breakdown Periwound Skin Texture Texture Color No Abnormalities Noted: No No Abnormalities Noted: No Callus: No Atrophie Blanche: No Crepitus: No Cyanosis: No Excoriation: No Ecchymosis: No Fluctuance: No Erythema: No Friable: No Hemosiderin Staining: No Induration: No Mottled: No Localized Edema: No Pallor: No Rash: No Rubor: No Scarring: Yes Moisture No Abnormalities Noted: No Dry / Scaly: No Maceration: No Moist: Yes Wound Preparation Ulcer Cleansing: Wound Cleanser Topical Anesthetic Applied: Other: lidociaNE 4%, Treatment Notes Wound #  9 (Left,  Posterior Lower Leg) 1. Cleansed with: Clean wound with Normal Saline Cleanse wound with antibacterial soap and water 2. Anesthetic Topical Lidocaine 4% cream to wound bed prior to debridement 4. Dressing Applied: Other dressing (specify in notes) 5. Secondary Dressing Applied ABD Pad 7. Secured with 3 Layer Compression System - Bilateral Notes [employer] Electronic Signature(s) HARINI, DEARMOND ([id number]) Signed: 05/10/2015 5:15:26 PM By: Elliot Gurney, RN, BSN, Kim RN, BSN Entered By: Elliot Gurney, RN, BSN, Kim on 05/10/2015 08:35:01 Archambault, [name] ([id number]) -------------------------------------------------------------------------------- Vitals Details Patient Name: Mangieri, Altagracia H. Date of Service: 05/10/2015 8:00 AM Medical Record Patient Account Number: [id number] Number: Treating RN: Huel Coventry 1956-07-15 (58 y.o. Other Clinician: Date of Birth/Sex: Female) Treating BURNS III, Primary Care Physician: Fidel Levy Physician/Extender: Zollie Beckers Referring Physician: Jones Broom in Treatment: 14 Vital Signs Time Taken: 08:13 Temperature (F): 98.2 Height (in): 68 Pulse (bpm): 89 Weight (lbs): 294 Respiratory Rate (breaths/min): 18 Body Mass Index (BMI): 44.7 Blood Pressure (mmHg): 140/78 Reference Range: 80 - 120 mg / dl Electronic Signature(s) Signed: 05/10/2015 5:15:26 PM By: Elliot Gurney, RN, BSN, Kim RN, BSN Entered By: Elliot Gurney, RN, BSN, Kim on 05/10/2015 08:16:10

## 2015-05-11 NOTE — Op Note (Signed)
Operative Note     Preoperative diagnosis:   1. ESRD with functional permanent access  Postoperative diagnosis:  1. ESRD with functional permanent access  Procedure:  Removal of right jugular Permcath  Surgeon:  Festus BarrenJason Dew, MD  Anesthesia:  Local  EBL:  Minimal  Indication for the Procedure:  The patient has a functional permanent dialysis access and no longer needs their permcath.  This can be removed.  Risks and benefits are discussed and informed consent is obtained.  Description of the Procedure:  The patient's right neck, chest and existing catheter were sterilely prepped and draped. The area around the catheter was anesthetized copiously with 1% lidocaine. The catheter was dissected out with curved hemostats until the cuff was freed from the surrounding fibrous sheath. The fiber sheath was transected, and the catheter was then removed in its entirety using gentle traction. Pressure was held and sterile dressings were placed. The patient tolerated the procedure well and was taken to the recovery room in stable condition.     DEW,JASON  05/11/2015, 8:30 AM

## 2015-05-11 NOTE — Progress Notes (Signed)
KAMIA, INSALACO ([id number]) Visit Report for 05/10/2015 Chief Complaint Document Details Patient Name: [name], [name]. Date of Service: 05/10/2015 8:00 AM Medical Record Patient Account Number: [id number] Number: Treating RN: Huel Coventry [birth year] (58 y.o. Other Clinician: Date of Birth/Sex: Female) Treating BURNS III, Primary Care Physician/Extender: Karn Cassis Physician: Referring Physician: Jones Broom in Treatment: 14 Information Obtained from: Patient Chief Complaint Bilateral calf ulcers. Electronic Signature(s) Signed: 05/10/2015 3:59:00 PM By: Madelaine Bhat MD Entered By: Madelaine Bhat on 05/10/2015 14:07:30 Sopko, [name] ([id number]) -------------------------------------------------------------------------------- Debridement Details Patient Name: [name], [name] Date of Service: 05/10/2015 8:00 AM Medical Record Patient Account Number: [id number] Number: Treating RN: Huel Coventry 1955-10-01 (58 y.o. Other Clinician: Date of Birth/Sex: Female) Treating BURNS III, Primary Care Physician/Extender: Karn Cassis Physician: Referring Physician: Jones Broom in Treatment: 14 Debridement Performed for Wound #1 Right,Proximal Lower Leg Assessment: Performed By: Physician BURNS III, Melanie Crazier., MD Debridement: Debridement Pre-procedure Yes Verification/Time Out Taken: Start Time: 08:30 Pain Control: Lidocaine 4% Topical Solution Level: Skin/Subcutaneous Tissue Total Area Debrided (L x 6 (cm) x 5 (cm) = 30 (cm) W): Tissue and other Viable, Non-Viable, Exudate, Fat, Fibrin/Slough, Subcutaneous material debrided: Instrument: Curette Bleeding: Minimum Hemostasis Achieved: Pressure End Time: 08:37 Procedural Pain: 2 Post Procedural Pain: 0 Response to Treatment: Procedure was tolerated well Post Debridement Measurements of Total Wound Length: (cm) 6 Width: (cm)  5 Depth: (cm) 0.7 Volume: (cm) 16.493 Post Procedure Diagnosis Same as Pre-procedure Electronic Signature(s) Signed: 05/10/2015 3:59:00 PM By: Madelaine Bhat MD Signed: 05/10/2015 5:15:26 PM By: Elliot Gurney RN, BSN, Kim RN, BSN Entered By: Madelaine Bhat on 05/10/2015 14:05:50 Shain, [name] ([id number]) Suman, Lonna Rexene Edison ([id number]) -------------------------------------------------------------------------------- Debridement Details Patient Name: [name], [name] Date of Service: 05/10/2015 8:00 AM Medical Record Patient Account Number: [id number] Number: Treating RN: Huel Coventry 01/12/1956 (58 y.o. Other Clinician: Date of Birth/Sex: Female) Treating BURNS III, Primary Care Physician/Extender: Karn Cassis Physician: Referring Physician: Jones Broom in Treatment: 14 Debridement Performed for Wound #8 Right,Posterior Lower Leg Assessment: Performed By: Physician BURNS III, Melanie Crazier., MD Debridement: Debridement Pre-procedure Yes Verification/Time Out Taken: Start Time: 08:30 Pain Control: Lidocaine 4% Topical Solution Level: Skin/Subcutaneous Tissue Total Area Debrided (L x 9 (cm) x 9 (cm) = 81 (cm) W): Tissue and other Viable, Non-Viable, Exudate, Fat, Fibrin/Slough, Subcutaneous material debrided: Instrument: Curette Bleeding: Minimum Hemostasis Achieved: Pressure End Time: 08:37 Procedural Pain: 2 Post Procedural Pain: 0 Response to Treatment: Procedure was tolerated well Post Debridement Measurements of Total Wound Length: (cm) 9 Width: (cm) 9 Depth: (cm) 0.4 Volume: (cm) 25.447 Post Procedure Diagnosis Same as Pre-procedure Electronic Signature(s) Signed: 05/10/2015 3:59:00 PM By: Madelaine Bhat MD Signed: 05/10/2015 5:15:26 PM By: Elliot Gurney RN, BSN, Kim RN, BSN Entered By: Madelaine Bhat on 05/10/2015 14:06:11 Briner, [name] ([id number]) Iverson, Aerith Rexene Edison  ([id number]) -------------------------------------------------------------------------------- Debridement Details Patient Name: [name], [name] Date of Service: 05/10/2015 8:00 AM Medical Record Patient Account Number: [id number] Number: Treating RN: Huel Coventry 1956/05/23 (58 y.o. Other Clinician: Date of Birth/Sex: Female) Treating BURNS III, Primary Care Physician/Extender: Karn Cassis Physician: Referring Physician: Jones Broom in Treatment: 14 Debridement Performed for Wound #3 Left,Lateral Lower Leg Assessment: Performed By: Physician BURNS III, Melanie Crazier., MD Debridement: Debridement Pre-procedure Yes Verification/Time Out Taken: Start Time: 08:30 Pain Control: Lidocaine 4% Topical Solution Level: Skin/Subcutaneous Tissue Total Area Debrided (L x 10 (cm)  x 5 (cm) = 50 (cm) W): Tissue and other Viable, Non-Viable, Exudate, Fat, Fibrin/Slough, Subcutaneous material debrided: Instrument: Curette Bleeding: Minimum Hemostasis Achieved: Pressure End Time: 08:37 Procedural Pain: 2 Post Procedural Pain: 0 Response to Treatment: Procedure was tolerated well Post Debridement Measurements of Total Wound Length: (cm) 22.5 Width: (cm) 11 Depth: (cm) 1.6 Volume: (cm) 311.018 Post Procedure Diagnosis Same as Pre-procedure Electronic Signature(s) Signed: 05/10/2015 3:59:00 PM By: Madelaine Bhat MD Signed: 05/10/2015 5:15:26 PM By: Elliot Gurney RN, BSN, Kim RN, BSN Entered By: Madelaine Bhat on 05/10/2015 14:06:51 Cappiello, [name] ([id number]) Yannuzzi, Lakeitha Rexene Edison ([id number]) -------------------------------------------------------------------------------- Debridement Details Patient Name: [name], [name] Date of Service: 05/10/2015 8:00 AM Medical Record Patient Account Number: [id number] Number: Treating RN: Huel Coventry [birth year] (58 y.o. Other Clinician: Date of Birth/Sex: Female) Treating BURNS III, Primary  Care Physician/Extender: Karn Cassis Physician: Referring Physician: Jones Broom in Treatment: 14 Debridement Performed for Wound #9 Left,Posterior Lower Leg Assessment: Performed By: Physician BURNS III, Melanie Crazier., MD Debridement: Debridement Pre-procedure Yes Verification/Time Out Taken: Start Time: 08:30 Pain Control: Lidocaine 4% Topical Solution Level: Skin/Subcutaneous Tissue Total Area Debrided (L x 2 (cm) x 2.8 (cm) = 5.6 (cm) W): Tissue and other Viable, Non-Viable, Exudate, Fat, Fibrin/Slough, Subcutaneous material debrided: Instrument: Curette Bleeding: Minimum Hemostasis Achieved: Pressure End Time: 08:37 Procedural Pain: 2 Post Procedural Pain: 0 Response to Treatment: Procedure was tolerated well Post Debridement Measurements of Total Wound Length: (cm) 2 Width: (cm) 2.8 Depth: (cm) 0.5 Volume: (cm) 2.199 Post Procedure Diagnosis Same as Pre-procedure Electronic Signature(s) Signed: 05/10/2015 3:59:00 PM By: Madelaine Bhat MD Signed: 05/10/2015 5:15:26 PM By: Elliot Gurney RN, BSN, Kim RN, BSN Entered By: Madelaine Bhat on 05/10/2015 14:07:21 Reinhold, [name] ([id number]) Arzola, Hindy HMarland Kitchen ([id number]) -------------------------------------------------------------------------------- HPI Details Patient Name: [name], [name] Date of Service: 05/10/2015 8:00 AM Medical Record Patient Account Number: [id number] Number: Treating RN: Huel Coventry 1956-02-13 (58 y.o. Other Clinician: Date of Birth/Sex: Female) Treating BURNS III, Primary Care Physician/Extender: Karn Cassis Physician: Referring Physician: Jones Broom in Treatment: 14 History of Present Illness HPI Description: 59 year old with h/o DM (Hgb A1c 7.4 in Aug 2016), ESRD (on hemodialysis since July 2016). Presented to her PCP Dr. Venora Maples for BLE ulcers since June 2016. Cultures from 03/07/2015 grew Stenotrophomonas  maltophilia and enterococcus species sensitive to levofloxacin, Septra, ampicillin. Treated with doxycycline. She was taken to the OR on 03/13/2015 for a angioplasty of her left arm AV fistula. No lower extremity debridement or biopsies. 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 No record of venous US to assess for venous insufficiency. Patient has declined [place]. Repeat culture 04/19/2015 grew methicillin sensitive staph aureus (sensitive to tetracycline), Stenotrophomonas maltophilia, and Enterococcus faecalis. Completed doxycycline. Performing dressing changes with silver alginate. Tolerating Profore light. Declined operative debridement and punch biopsy. She returns to clinic and is without complaints. No significant pain. No fever or chills. Moderate drainage. Electronic Signature(s) Signed: 05/10/2015 3:59:00 PM By: Madelaine Bhat MD Entered By: Madelaine Bhat on 05/10/2015 14:10:48 Scritchfield, [name] ([id number]) -------------------------------------------------------------------------------- Physical Exam Details Patient Name: [name], [name] Date of Service: 05/10/2015 8:00 AM Medical Record Patient Account Number: [id number] Number: Treating RN: Huel Coventry 04-Dec-1955 (58 y.o. Other Clinician: Date of Birth/Sex: Female) Treating BURNS III, Primary Care Physician/Extender: Karn Cassis Physician: Referring Physician: Jones Broom  in Treatment: 14 Constitutional . Pulse regular. Respirations normal and unlabored. Afebrile. . Notes Bilateral calf ulcerations improved. Full-thickness. Eschar and necrotic fat sharply debrided. No exposed deep structures at this time. Cellulitis resolved. 2+ pitting edema. Palpable DP bilaterally. Triphasic on the right. Biphasic on the left. Noncompressible. Electronic  Signature(s) Signed: 05/10/2015 3:59:00 PM By: Madelaine Bhat MD Entered By: Madelaine Bhat on 05/10/2015 14:11:36 Scalise, [name] ([id number]) -------------------------------------------------------------------------------- Physician Orders Details Patient Name: [name], [name] Date of Service: 05/10/2015 8:00 AM Medical Record Patient Account Number: [id number] Number: Treating RN: Huel Coventry 09-Jun-1956 (58 y.o. Other Clinician: Date of Birth/Sex: Female) Treating BURNS III, Primary Care Physician/Extender: Karn Cassis Physician: Referring Physician: Jones Broom in Treatment: 14 Verbal / Phone Orders: Yes Clinician: Huel Coventry Read Back and Verified: Yes Diagnosis Coding Wound Cleansing Wound #1 Right,Proximal Lower Leg o Clean wound with Normal Saline. o Cleanse wound with mild soap and water o May Shower, gently pat wound dry prior to applying new dressing. Wound #2 Right,Distal Lower Leg o Clean wound with Normal Saline. o Cleanse wound with mild soap and water o May Shower, gently pat wound dry prior to applying new dressing. Wound #3 Left,Lateral Lower Leg o Clean wound with Normal Saline. o Cleanse wound with mild soap and water o May Shower, gently pat wound dry prior to applying new dressing. Wound #8 Right,Posterior Lower Leg o Clean wound with Normal Saline. o Cleanse wound with mild soap and water o May Shower, gently pat wound dry prior to applying new dressing. Wound #9 Left,Posterior Lower Leg o Clean wound with Normal Saline. o Cleanse wound with mild soap and water o May Shower, gently pat wound dry prior to applying new dressing. Anesthetic Wound #1 Right,Proximal Lower Leg o Topical Lidocaine 4% cream applied to wound bed prior to debridement Wound #2 Right,Distal Lower Leg o Topical Lidocaine 4% cream applied to wound bed prior to debridement Wound #3 Left,Lateral  Lower Leg [name], [name] ([id number]) o Topical Lidocaine 4% cream applied to wound bed prior to debridement Wound #8 Right,Posterior Lower Leg o Topical Lidocaine 4% cream applied to wound bed prior to debridement Wound #9 Left,Posterior Lower Leg o Topical Lidocaine 4% cream applied to wound bed prior to debridement Primary Wound Dressing Wound #1 Right,Proximal Lower Leg o Other: - Cutimed Sorbact applied in clinic; Home health may use calcium alginate if Cutimed Sorbact is not available Wound #2 Right,Distal Lower Leg o Other: - Cutimed Sorbact applied in clinic; Home health may use calcium alginate if Cutimed Sorbact is not available Wound #3 Left,Lateral Lower Leg o Other: - Cutimed Sorbact applied in clinic; Home health may use calcium alginate if Cutimed Sorbact is not available Wound #8 Right,Posterior Lower Leg o Other: - Cutimed Sorbact applied in clinic; Home health may use calcium alginate if Cutimed Sorbact is not available Wound #9 Left,Posterior Lower Leg o Other: - Cutimed Sorbact applied in clinic; Home health may use calcium alginate if Cutimed Sorbact is not available Secondary Dressing Wound #1 Right,Proximal Lower Leg o ABD pad o Dry Gauze Wound #2 Right,Distal Lower Leg o ABD pad o Dry Gauze Wound #3 Left,Lateral Lower Leg o ABD pad o Dry Gauze Wound #8 Right,Posterior Lower Leg o ABD pad o Dry Gauze Wound #9 Left,Posterior Lower Leg o ABD pad o Dry Gauze [name], [name] ([id number]) Dressing Change Frequency Wound #1 Right,Proximal Lower Leg o Change Dressing Monday, Wednesday, Friday Wound #2 Right,Distal Lower Leg   o Change Dressing Monday, Wednesday, Friday Wound #3 Left,Lateral Lower Leg o Change Dressing Monday, Wednesday, Friday Wound #8 Right,Posterior Lower Leg o Change Dressing Monday, Wednesday, Friday Wound #9 Left,Posterior Lower Leg o Change Dressing Monday, Wednesday,  Friday Follow-up Appointments Wound #1 Right,Proximal Lower Leg o Return Appointment in 2 weeks. Wound #2 Right,Distal Lower Leg o Return Appointment in 2 weeks. Wound #3 Left,Lateral Lower Leg o Return Appointment in 2 weeks. Wound #8 Right,Posterior Lower Leg o Return Appointment in 2 weeks. Wound #9 Left,Posterior Lower Leg o Return Appointment in 2 weeks. Edema Control Wound #1 Right,Proximal Lower Leg o 3 Layer Compression System - Bilateral - Profore lite Wound #2 Right,Distal Lower Leg o 3 Layer Compression System - Bilateral - Profore lite Wound #3 Left,Lateral Lower Leg o 3 Layer Compression System - Bilateral - Profore lite Wound #8 Right,Posterior Lower Leg o 3 Layer Compression System - Bilateral - Profore lite Wound #9 Left,Posterior Lower Leg o 3 Layer Compression System - Bilateral - Profore lite [name], [name] ([id number]) Home Health Wound #1 Right,Proximal Lower Leg o Continue Home Health Visits - [facility]: Monday, Wednesday and Friday **WRAP GOES FROM BASE OF TOES TO THREE FINGER WIDTHS BENEATH BEND IN KNEE. o Home Health Nurse may visit PRN to address patientos wound care needs. o FACE TO FACE ENCOUNTER: MEDICARE and MEDICAID PATIENTS: I certify that this patient is under my care and that I had a face-to-face encounter that meets the physician face-to-face encounter requirements with this patient on this date. The encounter with the patient was in whole or in part for the following MEDICAL CONDITION: (primary reason for Home Healthcare) MEDICAL NECESSITY: I certify, that based on my findings, NURSING services are a medically necessary home health service. HOME BOUND STATUS: I certify that my clinical findings support that this patient is homebound (i.e., Due to illness or injury, pt requires aid of supportive devices such as crutches, cane, wheelchairs, walkers, the use of special transportation or the assistance of another person  to leave their place of residence. There is a normal inability to leave the home and doing so requires considerable and taxing effort. Other absences are for medical reasons / religious services and are infrequent or of short duration when for other reasons). o If current dressing causes regression in wound condition, may D/C ordered dressing product/s and apply Normal Saline Moist Dressing daily until next Wound Healing Center / Other MD appointment. Notify Wound Healing Center of regression in wound condition at [phone]. o Please direct any NON-WOUND related issues/requests for orders to patient's Primary Care Physician Wound #2 Right,Distal Lower Leg o Continue Home Health Visits - [facility]: Monday, Wednesday and Friday **WRAP GOES FROM BASE OF TOES TO THREE FINGER WIDTHS BENEATH BEND IN KNEE. o Home Health Nurse may visit PRN to address patientos wound care needs. o FACE TO FACE ENCOUNTER: MEDICARE and MEDICAID PATIENTS: I certify that this patient is under my care and that I had a face-to-face encounter that meets the physician face-to-face encounter requirements with this patient on this date. The encounter with the patient was in whole or in part for the following MEDICAL CONDITION: (primary reason for Home Healthcare) MEDICAL NECESSITY: I certify, that based on my findings, NURSING services are a medically necessary home health service. HOME BOUND STATUS: I certify that my clinical findings support that this patient is homebound (i.e., Due to illness or injury, pt requires aid of supportive devices such as crutches, cane, wheelchairs, walkers, the use of special  transportation or the assistance of another person to leave their place of residence. There is a normal inability to leave the home and doing so requires considerable and taxing effort. Other absences are for medical reasons / religious services and are infrequent or of short duration when for other  reasons). o If current dressing causes regression in wound condition, may D/C ordered dressing product/s and apply Normal Saline Moist Dressing daily until next Wound Healing Center / Other MD appointment. Notify Wound Healing Center of regression in wound condition at [phone]. o Please direct any NON-WOUND related issues/requests for orders to patient's Primary Care Physician Wound #3 Left,Lateral Lower Leg o Continue Home Health Visits - [facility]: Monday, Wednesday and Friday **WRAP GOES FROM BASE OF TOES TO THREE FINGER WIDTHS BENEATH BEND IN KNEE. o Home Health Nurse may visit PRN to address patientos wound care needs. [name], [name] ([id number]) o FACE TO FACE ENCOUNTER: MEDICARE and MEDICAID PATIENTS: I certify that this patient is under my care and that I had a face-to-face encounter that meets the physician face-to-face encounter requirements with this patient on this date. The encounter with the patient was in whole or in part for the following MEDICAL CONDITION: (primary reason for Home Healthcare) MEDICAL NECESSITY: I certify, that based on my findings, NURSING services are a medically necessary home health service. HOME BOUND STATUS: I certify that my clinical findings support that this patient is homebound (i.e., Due to illness or injury, pt requires aid of supportive devices such as crutches, cane, wheelchairs, walkers, the use of special transportation or the assistance of another person to leave their place of residence. There is a normal inability to leave the home and doing so requires considerable and taxing effort. Other absences are for medical reasons / religious services and are infrequent or of short duration when for other reasons). o If current dressing causes regression in wound condition, may D/C ordered dressing product/s and apply Normal Saline Moist Dressing daily until next Wound Healing Center / Other MD appointment. Notify Wound Healing  Center of regression in wound condition at [phone]. o Please direct any NON-WOUND related issues/requests for orders to patient's Primary Care Physician Wound #8 Right,Posterior Lower Leg o Continue Home Health Visits - [facility]: Monday, Wednesday and Friday **WRAP GOES FROM BASE OF TOES TO THREE FINGER WIDTHS BENEATH BEND IN KNEE. o Home Health Nurse may visit PRN to address patientos wound care needs. o FACE TO FACE ENCOUNTER: MEDICARE and MEDICAID PATIENTS: I certify that this patient is under my care and that I had a face-to-face encounter that meets the physician face-to-face encounter requirements with this patient on this date. The encounter with the patient was in whole or in part for the following MEDICAL CONDITION: (primary reason for Home Healthcare) MEDICAL NECESSITY: I certify, that based on my findings, NURSING services are a medically necessary home health service. HOME BOUND STATUS: I certify that my clinical findings support that this patient is homebound (i.e., Due to illness or injury, pt requires aid of supportive devices such as crutches, cane, wheelchairs, walkers, the use of special transportation or the assistance of another person to leave their place of residence. There is a normal inability to leave the home and doing so requires considerable and taxing effort. Other absences are for medical reasons / religious services and are infrequent or of short duration when for other reasons). o If current dressing causes regression in wound condition, may D/C ordered dressing product/s and apply Normal Saline Moist Dressing  daily until next Wound Healing Center / Other MD appointment. Notify Wound Healing Center of regression in wound condition at [phone]. o Please direct any NON-WOUND related issues/requests for orders to patient's Primary Care Physician Wound #9 Left,Posterior Lower Leg o Continue Home Health Visits - [facility]: Monday, Wednesday  and Friday **WRAP GOES FROM BASE OF TOES TO THREE FINGER WIDTHS BENEATH BEND IN KNEE. o Home Health Nurse may visit PRN to address patientos wound care needs. o FACE TO FACE ENCOUNTER: MEDICARE and MEDICAID PATIENTS: I certify that this patient is under my care and that I had a face-to-face encounter that meets the physician face-to-face encounter requirements with this patient on this date. The encounter with the patient was in whole or in part for the following MEDICAL CONDITION: (primary reason for Home Healthcare) MEDICAL NECESSITY: I certify, that based on my findings, NURSING services are a medically necessary home health service. HOME BOUND STATUS: I certify that my clinical findings [name], [name] ([id number]) support that this patient is homebound (i.e., Due to illness or injury, pt requires aid of supportive devices such as crutches, cane, wheelchairs, walkers, the use of special transportation or the assistance of another person to leave their place of residence. There is a normal inability to leave the home and doing so requires considerable and taxing effort. Other absences are for medical reasons / religious services and are infrequent or of short duration when for other reasons). o If current dressing causes regression in wound condition, may D/C ordered dressing product/s and apply Normal Saline Moist Dressing daily until next Wound Healing Center / Other MD appointment. Notify Wound Healing Center of regression in wound condition at [phone]. o Please direct any NON-WOUND related issues/requests for orders to patient's Primary Care Physician Electronic Signature(s) Signed: 05/10/2015 3:59:00 PM By: Madelaine BhatBurns, III, Cherish Runde MD Signed: 05/10/2015 5:15:26 PM By: Elliot GurneyWoody, RN, BSN, Kim RN, BSN Entered By: Elliot GurneyWoody, RN, BSN, Kim on 05/10/2015 08:51:11 Lempke, Aaron EdelmanPAMELA H. ([id number]) -------------------------------------------------------------------------------- Problem  List Details Patient Name: Herbison, Janelys H. Date of Service: 05/10/2015 8:00 AM Medical Record Patient Account Number: [id number] Number: Treating RN: Huel CoventryWoody, Kim [birth year] (58 y.o. 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 ([id number]) Electronic Signature(s) Signed: 05/10/2015 3:59:00 PM By: Madelaine BhatBurns, III, Vershawn Westrup MD Entered By: Madelaine BhatBurns, III, Donye Dauenhauer on 05/10/2015 14:05:23 Schueler, Meggan Rexene EdisonH. ([id number]) -------------------------------------------------------------------------------- Progress Note Details Patient Name: Doble, Maigen H. Date of Service: 05/10/2015 8:00 AM Medical Record Patient Account Number: [id number] Number: Treating RN: Huel CoventryWoody, Kim [birth year] (58 y.o. Other Clinician: Date of Birth/Sex: Female) Treating BURNS III, Primary Care Physician/Extender: Karn CassisWALTER HAWKINS JR, JAMES Physician: Referring Physician: Jones BroomHAWKINS JR, JAMES Weeks in Treatment: 14 Subjective Chief Complaint Information obtained from Patient Bilateral calf ulcers. History of Present Illness (HPI) 59 year old with h/o DM (Hgb A1c 7.4 in Aug 2016), ESRD (on hemodialysis since July 2016). Presented to her  PCP Dr. Fayrene FearingJames  Hawkins for BLE ulcers since June 2016. Cultures from 03/07/2015 grew Stenotrophomonas maltophilia and enterococcus species sensitive to levofloxacin, Septra, ampicillin. Treated with doxycycline. She was taken to the OR on 03/13/2015 for a angioplasty of her left arm AV fistula. No lower extremity debridement or biopsies. 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 No record of venous US to assess for venous insufficiency. Patient has declined [place]. Repeat culture 04/19/2015 grew methicillin sensitive staph aureus (sensitive to tetracycline), Stenotrophomonas maltophilia, and Enterococcus faecalis. Completed doxycycline. Performing dressing changes with silver alginate. Tolerating Profore light. Declined operative debridement and punch biopsy. She returns to clinic and is without complaints. No significant pain. No fever or chills. Moderate drainage. Objective Constitutional Pulse regular. Respirations normal and unlabored. Afebrile. Vitals Time Taken: 8:13 AM, Height: 68 in, Weight: 294 lbs, BMI: 44.7, Temperature: 98.2 F, Pulse: 89 bpm, Respiratory Rate: 18 breaths/min, Blood Pressure: 140/78 mmHg. Brosnahan, Kayleigh HMarland Kitchen ([id number]) General Notes: Bilateral calf ulcerations improved. Full-thickness. Eschar and necrotic fat sharply debrided. No exposed deep structures at this time. Cellulitis resolved. 2+ pitting edema. Palpable DP bilaterally. Triphasic on the right. Biphasic on the left. Noncompressible. Integumentary (Hair, Skin) Wound #1 status is Open. Original cause of wound was Gradually Appeared. The wound is located on the Right,Proximal Lower Leg. The wound measures 6cm length x 5cm width x 0.6cm depth; 23.562cm^2 area and 14.137cm^3 volume. There is a large amount of purulent drainage noted. The wound margin is flat and intact. There is small (1-33%)  granulation within the wound bed. There is a large (67-100%) amount of necrotic tissue within the wound bed including Adherent Slough. The periwound skin appearance did not exhibit: Callus, Crepitus, Excoriation, Fluctuance, Friable, Induration, Localized Edema, Rash, Scarring, Dry/Scaly, Maceration, Moist, Atrophie Blanche, Cyanosis, Ecchymosis, Hemosiderin Staining, Mottled, Pallor, Rubor, Erythema. Wound #2 status is Open. Original cause of wound was Gradually Appeared. The wound is located on the Right,Distal Lower Leg. The wound measures 10cm length x 5cm width x 0.3cm depth; 39.27cm^2 area and 11.781cm^3 volume. The wound is limited to skin breakdown. There is a large amount of purulent drainage noted. The wound margin is indistinct and nonvisible. There is no granulation within the wound bed. There is a large (67-100%) amount of necrotic tissue within the wound bed including Eschar. The periwound skin appearance exhibited: Scarring, Maceration. Wound #3 status is Open. Original cause of wound was Gradually Appeared. The wound is located on the Left,Lateral Lower Leg. The wound measures 22.5cm length x 11cm width x 1.5cm depth; 194.386cm^2 area and 291.579cm^3 volume. The wound is limited to skin breakdown. There is a large amount of purulent drainage noted. The wound margin is indistinct and nonvisible. There is no granulation within the wound bed. There is a large (67-100%) amount of necrotic tissue within the wound bed including Adherent Slough. The periwound skin appearance exhibited: Scarring, Maceration. The periwound skin appearance did not exhibit: Callus, Crepitus, Excoriation, Fluctuance, Friable, Induration, Localized Edema, Rash, Dry/Scaly, Moist, Atrophie Blanche, Cyanosis, Ecchymosis, Hemosiderin Staining, Mottled, Pallor, Rubor, Erythema. Wound #8 status is Open. Original cause of wound was Gradually Appeared. The wound is located on the Right,Posterior Lower Leg. The wound  measures 9cm length x 9cm width x 0.3cm depth; 63.617cm^2 area and 19.085cm^3 volume. The wound is limited to skin breakdown. There is a large amount of purulent drainage noted. The wound margin is indistinct and nonvisible. There is no granulation within the wound bed.  There is a large (67-100%) amount of necrotic tissue within the wound bed including Adherent Slough. The periwound skin appearance exhibited: Scarring, Maceration, Moist. The periwound skin appearance did not exhibit: Callus, Crepitus, Excoriation, Fluctuance, Friable, Induration, Localized Edema, Rash, Dry/Scaly, Atrophie Blanche, Cyanosis, Ecchymosis, Hemosiderin Staining, Mottled, Pallor, Rubor, Erythema. Wound #9 status is Open. Original cause of wound was Gradually Appeared. The wound is located on the Left,Posterior Lower Leg. The wound measures 2cm length x 2.8cm width x 0.4cm depth; 4.398cm^2 area and 1.759cm^3 volume. The wound is limited to skin breakdown. There is a none present amount of drainage noted. The wound margin is flat and intact. There is no granulation within the wound bed. There is a large (67-100%) amount of necrotic tissue within the wound bed including Eschar and Adherent Slough. The periwound skin appearance exhibited: Scarring, Moist. The periwound skin appearance did not exhibit: Callus, Crepitus, Excoriation, Fluctuance, Friable, Induration, Localized Edema, Rash, Dry/Scaly, Maceration, Atrophie Blanche, Cyanosis, Ecchymosis, Hemosiderin Staining, Mottled, Pallor, Rubor, [place]lendorf, Laneah H. ([id number]) Erythema. Assessment Active Problems ICD-10 E11.622 - Type 2 diabetes mellitus with other skin ulcer N18.6 - End stage renal disease L97.222 - Non-pressure chronic ulcer of left calf with fat layer exposed L97.212 - Non-pressure chronic ulcer of right calf with fat layer exposed R60.0 - Localized edema Bilateral calf ulcerations. 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 There was a Skin/Subcutaneous Tissue Debridement (40981-19147) debridement with total area of 30 sq cm performed by BURNS III, Melanie Crazier., MD. with the following instrument(s): Curette to remove Viable and Non-Viable tissue/material including Exudate, Fat, Fibrin/Slough, and Subcutaneous after achieving pain control using Lidocaine 4% Topical Solution. A time out was conducted prior to the start of the procedure. A Minimum amount of bleeding was controlled with Pressure. The procedure was tolerated well with a pain level of 2 throughout and a pain level of 0 following the procedure. Post Debridement Measurements: 6cm length x 5cm width x 0.7cm depth; 16.493cm^3 volume. 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 There was a Skin/Subcutaneous Tissue Debridement ([id number]) debridement with total area of 50 sq cm performed by BURNS III, Melanie Crazier., MD. with the following instrument(s): Curette to remove Viable and Non-Viable tissue/material including Exudate, Fat, Fibrin/Slough, and Subcutaneous after achieving pain control using Lidocaine 4% Topical Solution. A time out was conducted prior to the start of the procedure. A Minimum amount of bleeding was controlled with Pressure. The procedure was tolerated well with a pain level of 2 throughout and a pain level of 0 following the procedure. Post Debridement Measurements: 22.5cm length x 11cm width x 1.6cm depth; 311.018cm^3 volume. Post procedure Diagnosis Wound #3: Same as Pre-Procedure Trammel, Mickayla H. ([id number]) Wound #8 Wound #8 is a Calciphylaxis located on the Right,Posterior Lower Leg . There was a Skin/Subcutaneous Tissue Debridement ([id number]) debridement with total area of 81 sq cm performed by BURNS III, Melanie Crazier., MD. with the following instrument(s): Curette to remove Viable and Non-Viable  tissue/material including Exudate, Fat, Fibrin/Slough, and Subcutaneous after achieving pain control using Lidocaine 4% Topical Solution. A time out was conducted prior to the start of the procedure. A Minimum amount of bleeding was controlled with Pressure. The procedure was tolerated well with a pain level of 2 throughout and a pain level of 0 following the procedure. Post Debridement Measurements: 9cm length x 9cm width x 0.4cm depth; 25.447cm^3  volume. 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 There was a Skin/Subcutaneous Tissue Debridement ([id number]) debridement with total area of 5.6 sq cm performed by BURNS III, Melanie Crazier., MD. with the following instrument(s): Curette to remove Viable and Non-Viable tissue/material including Exudate, Fat, Fibrin/Slough, and Subcutaneous after achieving pain control using Lidocaine 4% Topical Solution. A time out was conducted prior to the start of the procedure. A Minimum amount of bleeding was controlled with Pressure. The procedure was tolerated well with a pain level of 2 throughout and a pain level of 0 following the procedure. Post Debridement Measurements: 2cm length x 2.8cm width x 0.5cm depth; 2.199cm^3 volume. Post procedure Diagnosis Wound #9: Same as Pre-Procedure Plan Wound Cleansing: Wound #1 Right,Proximal Lower Leg: Clean wound with Normal Saline. Cleanse wound with mild soap and water May Shower, gently pat wound dry prior to applying new dressing. Wound #2 Right,Distal Lower Leg: Clean wound with Normal Saline. Cleanse wound with mild soap and water May Shower, gently pat wound dry prior to applying new dressing. Wound #3 Left,Lateral Lower Leg: Clean wound with Normal Saline. Cleanse wound with mild soap and water May Shower, gently pat wound dry prior to applying new dressing. Wound #8 Right,Posterior Lower Leg: Clean wound with  Normal Saline. Cleanse wound with mild soap and water May Shower, gently pat wound dry prior to applying new dressing. Wound #9 Left,Posterior Lower Leg: Clean wound with Normal Saline. Cleanse wound with mild soap and water Ringstad, Charday H. ([id number]) May Shower, gently pat wound dry prior to applying new dressing. Anesthetic: Wound #1 Right,Proximal Lower Leg: Topical Lidocaine 4% cream applied to wound bed prior to debridement Wound #2 Right,Distal Lower Leg: Topical Lidocaine 4% cream applied to wound bed prior to debridement Wound #3 Left,Lateral Lower Leg: Topical Lidocaine 4% cream applied to wound bed prior to debridement Wound #8 Right,Posterior Lower Leg: Topical Lidocaine 4% cream applied to wound bed prior to debridement Wound #9 Left,Posterior Lower Leg: Topical Lidocaine 4% cream applied to wound bed prior to debridement Primary Wound Dressing: Wound #1 Right,Proximal Lower Leg: Other: - Cutimed Sorbact applied in clinic; Home health may use calcium alginate if Cutimed Sorbact is not available Wound #2 Right,Distal Lower Leg: Other: - Cutimed Sorbact applied in clinic; Home health may use calcium alginate if Cutimed Sorbact is not available Wound #3 Left,Lateral Lower Leg: Other: - Cutimed Sorbact applied in clinic; Home health may use calcium alginate if Cutimed Sorbact is not available Wound #8 Right,Posterior Lower Leg: Other: - Cutimed Sorbact applied in clinic; Home health may use calcium alginate if Cutimed Sorbact is not available Wound #9 Left,Posterior Lower Leg: Other: - Cutimed Sorbact applied in clinic; Home health may use calcium alginate if Cutimed Sorbact is not available Secondary Dressing: Wound #1 Right,Proximal Lower Leg: ABD pad Dry Gauze Wound #2 Right,Distal Lower Leg: ABD pad Dry Gauze Wound #3 Left,Lateral Lower Leg: ABD pad Dry Gauze Wound #8 Right,Posterior Lower Leg: ABD pad Dry Gauze Wound #9 Left,Posterior Lower Leg: ABD  pad Dry Gauze Dressing Change Frequency: Wound #1 Right,Proximal Lower Leg: Change Dressing Monday, Wednesday, Friday Wound #2 Right,Distal Lower Leg: Change Dressing Monday, Wednesday, Friday Wound #3 Left,Lateral Lower Leg: Change Dressing Monday, Wednesday, Friday SCIOLI, Rejoice H. ([id number]) Wound #8 Right,Posterior Lower Leg: Change Dressing Monday, Wednesday, Friday Wound #9 Left,Posterior Lower Leg: Change Dressing Monday, Wednesday, Friday Follow-up Appointments: Wound #1 Right,Proximal Lower Leg: Return Appointment  in 2 weeks. Wound #2 Right,Distal Lower Leg: Return Appointment in 2 weeks. Wound #3 Left,Lateral Lower Leg: Return Appointment in 2 weeks. Wound #8 Right,Posterior Lower Leg: Return Appointment in 2 weeks. Wound #9 Left,Posterior Lower Leg: Return Appointment in 2 weeks. Edema Control: Wound #1 Right,Proximal Lower Leg: 3 Layer Compression System - Bilateral - Profore lite Wound #2 Right,Distal Lower Leg: 3 Layer Compression System - Bilateral - Profore lite Wound #3 Left,Lateral Lower Leg: 3 Layer Compression System - Bilateral - Profore lite Wound #8 Right,Posterior Lower Leg: 3 Layer Compression System - Bilateral - Profore lite Wound #9 Left,Posterior Lower Leg: 3 Layer Compression System - Bilateral - Profore lite Home Health: Wound #1 Right,Proximal Lower Leg: Continue Home Health Visits - [facility]: Monday, Wednesday and Friday **WRAP GOES FROM BASE OF TOES TO THREE FINGER WIDTHS BENEATH BEND IN KNEE. Home Health Nurse may visit PRN to address patient s wound care needs. FACE TO FACE ENCOUNTER: MEDICARE and MEDICAID PATIENTS: I certify that this patient is under my care and that I had a face-to-face encounter that meets the physician face-to-face encounter requirements with this patient on this date. The encounter with the patient was in whole or in part for the following MEDICAL CONDITION: (primary reason for Home Healthcare) MEDICAL  NECESSITY: I certify, that based on my findings, NURSING services are a medically necessary home health service. HOME BOUND STATUS: I certify that my clinical findings support that this patient is homebound (i.e., Due to illness or injury, pt requires aid of supportive devices such as crutches, cane, wheelchairs, walkers, the use of special transportation or the assistance of another person to leave their place of residence. There is a normal inability to leave the home and doing so requires considerable and taxing effort. Other absences are for medical reasons / religious services and are infrequent or of short duration when for other reasons). If current dressing causes regression in wound condition, may D/C ordered dressing product/s and apply Normal Saline Moist Dressing daily until next Wound Healing Center / Other MD appointment. Notify Wound Healing Center of regression in wound condition at [phone]. Please direct any NON-WOUND related issues/requests for orders to patient's Primary Care Physician Wound #2 Right,Distal Lower Leg: Continue Home Health Visits - [facility]: Monday, Wednesday and Friday **WRAP GOES FROM BASE OF TOES TO THREE FINGER WIDTHS BENEATH BEND IN KNEE. Home Health Nurse may visit PRN to address patient s wound care needs. FACE TO FACE ENCOUNTER: MEDICARE and MEDICAID PATIENTS: I certify that this patient is under my care and that I had a face-to-face encounter that meets the physician face-to-face encounter S[place]RI[place], MCENROE. ([id number]) requirements with this patient on this date. The encounter with the patient was in whole or in part for the following MEDICAL CONDITION: (primary reason for Home Healthcare) MEDICAL NECESSITY: I certify, that based on my findings, NURSING services are a medically necessary home health service. HOME BOUND STATUS: I certify that my clinical findings support that this patient is homebound (i.e., Due to illness or injury, pt requires  aid of supportive devices such as crutches, cane, wheelchairs, walkers, the use of special transportation or the assistance of another person to leave their place of residence. There is a normal inability to leave the home and doing so requires considerable and taxing effort. Other absences are for medical reasons / religious services and are infrequent or of short duration when for other reasons). If current dressing causes regression in wound condition, may D/C ordered dressing product/s and  apply Normal Saline Moist Dressing daily until next Wound Healing Center / Other MD appointment. Notify Wound Healing Center of regression in wound condition at [phone]. Please direct any NON-WOUND related issues/requests for orders to patient's Primary Care Physician Wound #3 Left,Lateral Lower Leg: Continue Home Health Visits - [facility]: Monday, Wednesday and Friday **WRAP GOES FROM BASE OF TOES TO THREE FINGER WIDTHS BENEATH BEND IN KNEE. Home Health Nurse may visit PRN to address patient s wound care needs. FACE TO FACE ENCOUNTER: MEDICARE and MEDICAID PATIENTS: I certify that this patient is under my care and that I had a face-to-face encounter that meets the physician face-to-face encounter requirements with this patient on this date. The encounter with the patient was in whole or in part for the following MEDICAL CONDITION: (primary reason for Home Healthcare) MEDICAL NECESSITY: I certify, that based on my findings, NURSING services are a medically necessary home health service. HOME BOUND STATUS: I certify that my clinical findings support that this patient is homebound (i.e., Due to illness or injury, pt requires aid of supportive devices such as crutches, cane, wheelchairs, walkers, the use of special transportation or the assistance of another person to leave their place of residence. There is a normal inability to leave the home and doing so requires considerable and taxing effort. Other  absences are for medical reasons / religious services and are infrequent or of short duration when for other reasons). If current dressing causes regression in wound condition, may D/C ordered dressing product/s and apply Normal Saline Moist Dressing daily until next Wound Healing Center / Other MD appointment. Notify Wound Healing Center of regression in wound condition at [phone]. Please direct any NON-WOUND related issues/requests for orders to patient's Primary Care Physician Wound #8 Right,Posterior Lower Leg: Continue Home Health Visits - [facility]: Monday, Wednesday and Friday **WRAP GOES FROM BASE OF TOES TO THREE FINGER WIDTHS BENEATH BEND IN KNEE. Home Health Nurse may visit PRN to address patient s wound care needs. FACE TO FACE ENCOUNTER: MEDICARE and MEDICAID PATIENTS: I certify that this patient is under my care and that I had a face-to-face encounter that meets the physician face-to-face encounter requirements with this patient on this date. The encounter with the patient was in whole or in part for the following MEDICAL CONDITION: (primary reason for Home Healthcare) MEDICAL NECESSITY: I certify, that based on my findings, NURSING services are a medically necessary home health service. HOME BOUND STATUS: I certify that my clinical findings support that this patient is homebound (i.e., Due to illness or injury, pt requires aid of supportive devices such as crutches, cane, wheelchairs, walkers, the use of special transportation or the assistance of another person to leave their place of residence. There is a normal inability to leave the home and doing so requires considerable and taxing effort. Other absences are for medical reasons / religious services and are infrequent or of short duration when for other reasons). If current dressing causes regression in wound condition, may D/C ordered dressing product/s and apply Normal Saline Moist Dressing daily until next Wound Healing  Center / Other MD appointment. Notify Wound Healing Center of regression in wound condition at [phone]. Please direct any NON-WOUND related issues/requests for orders to patient's Primary Care Physician Wound #9 Left,Posterior Lower Leg: Continue Home Health Visits - [facility]: Monday, Wednesday and Friday **WRAP GOES FROM BASE OF TOES TO THREE FINGER WIDTHS BENEATH BEND IN KNEE. ESTERA, OZIER ([id number]) Home Health Nurse may visit PRN to address patient s  wound care needs. FACE TO FACE ENCOUNTER: MEDICARE and MEDICAID PATIENTS: I certify that this patient is under my care and that I had a face-to-face encounter that meets the physician face-to-face encounter requirements with this patient on this date. The encounter with the patient was in whole or in part for the following MEDICAL CONDITION: (primary reason for Home Healthcare) MEDICAL NECESSITY: I certify, that based on my findings, NURSING services are a medically necessary home health service. HOME BOUND STATUS: I certify that my clinical findings support that this patient is homebound (i.e., Due to illness or injury, pt requires aid of supportive devices such as crutches, cane, wheelchairs, walkers, the use of special transportation or the assistance of another person to leave their place of residence. There is a normal inability to leave the home and doing so requires considerable and taxing effort. Other absences are for medical reasons / religious services and are infrequent or of short duration when for other reasons). If current dressing causes regression in wound condition, may D/C ordered dressing product/s and apply Normal Saline Moist Dressing daily until next Wound Healing Center / Other MD appointment. Notify Wound Healing Center of regression in wound condition at [phone]. Please direct any NON-WOUND related issues/requests for orders to patient's Primary Care Physician Continue cutimed sorbact (or silver  alginate) and profore lite 3X weekly. Electronic Signature(s) Signed: 05/10/2015 3:59:00 PM By: Madelaine Bhat MD Entered By: Madelaine Bhat on 05/10/2015 14:12:47 Shumard, [name] ([id number]) -------------------------------------------------------------------------------- SuperBill Details Patient Name: Demattia, Margaurite H. Date of Service: 05/10/2015 Medical Record Patient Account Number: [id number] Number: Treating RN: Huel Coventry 1956-04-12 (58 y.o. Other Clinician: Date of Birth/Sex: Female) Treating BURNS III, Primary Care Physician/Extender: Karn Cassis Physician: Weeks in Treatment: 14 Referring Physician: Fidel Levy Diagnosis Coding ICD-10 Codes Code Description [phone] Type 2 diabetes mellitus with other skin ulcer N18.6 End stage renal disease L97.222 Non-pressure chronic ulcer of left calf with fat layer exposed L97.212 Non-pressure chronic ulcer of right calf with fat layer exposed R60.0 Localized edema Facility Procedures CPT4 Code: [id number] Description: 11042 - DEB SUBQ TISSUE 20 SQ CM/< ICD-10 Description Diagnosis L97.222 Non-pressure chronic ulcer of left calf with fat l L97.212 Non-pressure chronic ulcer of right calf with fat Modifier: ayer exposed layer exposed Quantity: 1 CPT4 Code: [id number] Description: 11045 - DEB SUBQ TISS EA ADDL 20CM ICD-10 Description Diagnosis L97.222 Non-pressure chronic ulcer of left calf with fat l L97.212 Non-pressure chronic ulcer of right calf with fat Modifier: ayer exposed layer exposed Quantity: 8 Physician Procedures CPT4 Code: [id number] Description: 11042 - WC PHYS SUBQ TISS 20 SQ CM ICD-10 Description Diagnosis L97.222 Non-pressure chronic ulcer of left calf with fat la L97.212 Non-pressure chronic ulcer of right calf with fat l Modifier: yer exposed ayer exposed Quantity: 1 CPT4 Code: [id number] Vacca, PAM Description: 11045 - WC PHYS SUBQ TISS EA ADDL 20 CM Description ELA H.  ([id number]) Modifier: Quantity: 8 Electronic Signature(s) Signed: 05/10/2015 3:59:00 PM By: Madelaine Bhat MD Entered By: Madelaine Bhat on 05/10/2015 14:13:07

## 2015-05-11 NOTE — H&P (Signed)
  [facility] History & Physical Update  The patient was interviewed and re-examined.  The patient's previous History and Physical has been reviewed and is unchanged.  There is no change in the plan of care. We plan to proceed with the scheduled procedure.  DEW,JASON, MD  05/11/2015, 8:13 AM

## 2015-05-12 ENCOUNTER — Encounter: Payer: Self-pay | Admitting: Vascular Surgery

## 2015-05-17 ENCOUNTER — Other Ambulatory Visit: Payer: Self-pay | Admitting: *Deleted

## 2015-05-17 MED ORDER — DOXYCYCLINE HYCLATE 100 MG PO TABS: 100.0000 mg | ORAL_TABLET | Freq: Two times a day (BID) | ORAL | Status: DC

## 2015-05-24 ENCOUNTER — Ambulatory Visit: Payer: BC Managed Care – PPO | Admitting: Surgery

## 2015-05-30 ENCOUNTER — Encounter: Payer: Self-pay | Admitting: Family Medicine

## 2015-05-30 ENCOUNTER — Ambulatory Visit (INDEPENDENT_AMBULATORY_CARE_PROVIDER_SITE_OTHER): Payer: BC Managed Care – PPO | Admitting: Family Medicine

## 2015-05-30 VITALS — BP 132/79 | HR 90 | Temp 98.0°F | Resp 16 | Ht 68.0 in | Wt 261.0 lb

## 2015-05-30 DIAGNOSIS — N184 Chronic kidney disease, stage 4 (severe): Secondary | ICD-10-CM

## 2015-05-30 DIAGNOSIS — E1122 Type 2 diabetes mellitus with diabetic chronic kidney disease: Secondary | ICD-10-CM | POA: Diagnosis not present

## 2015-05-30 DIAGNOSIS — Z794 Long term (current) use of insulin: Secondary | ICD-10-CM | POA: Diagnosis not present

## 2015-05-30 DIAGNOSIS — N186 End stage renal disease: Secondary | ICD-10-CM

## 2015-05-30 DIAGNOSIS — I10 Essential (primary) hypertension: Secondary | ICD-10-CM | POA: Diagnosis not present

## 2015-05-30 DIAGNOSIS — Z992 Dependence on renal dialysis: Secondary | ICD-10-CM

## 2015-05-30 LAB — POCT GLYCOSYLATED HEMOGLOBIN (HGB A1C): Hemoglobin A1C: 8.4

## 2015-05-30 MED ORDER — INSULIN DETEMIR 100 UNIT/ML FLEXPEN: 36.0000 [IU] | PEN_INJECTOR | Freq: Every day | SUBCUTANEOUS | Status: DC

## 2015-05-30 NOTE — Patient Instructions (Signed)
To come back for A1c later this week.  Check sugars more often.    Discussed eating fewer calories and losing weight.

## 2015-05-30 NOTE — Progress Notes (Signed)
Name: [name]   MRN: [id number]    DOB: Dec 07, 1955   Date:05/30/2015       Progress Note  Subjective  Chief Complaint  Chief Complaint  Patient presents with  . Diabetes    HPI  Here to f/u DM.  BSs  Run 100-215.  She is feeling ok.  Goes to dialysis 3 days a week.  Has leg ulcers bilaterally that are slowly healing.  Going to wound care 1 day a week.   Fell x 2 over past month.  Just fell.  No dizziness, No card. Palpitations.  No syncope.  She did not stumble.  Did not check BSs. No problem-specific assessment & plan notes found for this encounter.   Past Medical History  Diagnosis Date  . Chronic kidney disease   . Hypertension   . Diabetes mellitus without complication [facility])     Social History  Substance Use Topics  . Smoking status: Never Smoker   . Smokeless tobacco: Never Used  . Alcohol Use: No     Current outpatient prescriptions:  .  carvedilol (COREG) 3.125 MG tablet, TAKE ONE TABLET BY MOUTH TWICE DAILY, Disp: 60 tablet, Rfl: 6 .  FREESTYLE LITE test strip, , Disp: , Rfl:  .  furosemide (LASIX) 80 MG tablet, Taking Tuesday Thursday Saturday and sunday, Disp: , Rfl:  .  Lancets (FREESTYLE) lancets, USE TO CHECK BLOOD SUGAR 4 TIMES DAILY, Disp: 200 each, Rfl: 12 .  LEVEMIR FLEXTOUCH 100 UNIT/ML Pen, INJECT 34 UNITS SUBCUTANEOUSLY AT BEDTIME, Disp: 9 pen, Rfl: 12 .  lidocaine-prilocaine (EMLA) cream, Apply 1 application topically as needed., Disp: , Rfl:  .  losartan (COZAAR) 50 MG tablet, TAKE ONE TABLET BY MOUTH ONCE DAILY, Disp: 30 tablet, Rfl: 12 .  SANTYL ointment, , Disp: , Rfl:  .  loratadine (CLARITIN) 10 MG tablet, Take 10 mg by mouth daily., Disp: , Rfl:   Allergies  Allergen Reactions  . Codeine Nausea Only    hydrocodone  . Vicodin [Hydrocodone-Acetaminophen] Nausea And Vomiting    Review of Systems  Constitutional: Negative for fever, chills, weight loss and malaise/fatigue.  HENT: Negative for hearing loss.   Eyes: Negative for  blurred vision and double vision.  Respiratory: Negative for cough, sputum production, shortness of breath and wheezing.   Cardiovascular: Negative for chest pain, palpitations and leg swelling.  Gastrointestinal: Negative for heartburn, abdominal pain and blood in stool.  Genitourinary: Negative for dysuria, urgency and frequency.  Musculoskeletal: Negative for myalgias and joint pain.  Skin: Negative for rash.  Neurological: Negative for dizziness, tremors, weakness and headaches.  Psychiatric/Behavioral: Negative for depression.      Objective  Filed Vitals:   05/30/15 0806  BP: 132/79  Pulse: 90  Temp: 98 F (36.7 C)  TempSrc: Oral  Resp: 16  Height:  (1.727 m)  Weight: 261 lb (118.389 kg)     Physical Exam  Constitutional: She is oriented to person, place, and time and well-developed, well-nourished, and in no distress. No distress.  HENT:  Head: Normocephalic and atraumatic.  Eyes: Conjunctivae and EOM are normal. Pupils are equal, round, and reactive to light. No scleral icterus.  Neck: Normal range of motion. Neck supple. Carotid bruit is not present. No thyromegaly present.  Cardiovascular: Normal rate and regular rhythm.  Exam reveals no gallop and no friction rub.   Murmur heard.  Systolic murmur is present with a grade of 3/6  Heard throughout  Pulmonary/Chest: Effort normal and breath sounds  normal. No respiratory distress. She has no wheezes. She has no rales.  Abdominal: Soft. Bowel sounds are normal. She exhibits no distension, no abdominal bruit and no mass. There is no tenderness.  Musculoskeletal: She exhibits edema (trrace edema  bilaterally.).  Lymphadenopathy:    She has no cervical adenopathy.  Neurological: She is alert and oriented to person, place, and time.  Skin: Skin is warm and dry.  Slowly healing leg ulcers.  Vitals reviewed.     Recent Results (from the past 2160 hour(s))  Wound culture     Status: None   Collection Time:  03/07/15 10:22 AM  Result Value Ref Range   Specimen Description LEG LEFT LOWER    Special Requests NONE    Gram Stain      RARE WBC SEEN MANY GRAM POSITIVE COCCI MANY GRAM NEGATIVE RODS    Culture      HEAVY GROWTH STENOTROPHOMONAS MALTOPHILIA HEAVY GROWTH ENTEROCOCCUS SPECIES    Report Status 03/11/2015 FINAL    Organism ID, Bacteria STENOTROPHOMONAS MALTOPHILIA    Organism ID, Bacteria ENTEROCOCCUS SPECIES       Susceptibility   Stenotrophomonas maltophilia - MIC*    LEVOFLOXACIN 1 SENSITIVE Sensitive     TRIMETH/SULFA <=20 SENSITIVE Sensitive     * HEAVY GROWTH STENOTROPHOMONAS MALTOPHILIA   Enterococcus species - MIC*    AMPICILLIN <=2 SENSITIVE Sensitive     LINEZOLID 2 SENSITIVE Sensitive     * HEAVY GROWTH ENTEROCOCCUS SPECIES  Potassium ([facility] vascular lab only)     Status: None   Collection Time: 03/13/15 10:00 AM  Result Value Ref Range   Potassium [facility] vascular lab) 3.2   Glucose, capillary     Status: Abnormal   Collection Time: 03/13/15 10:16 AM  Result Value Ref Range   Glucose-Capillary 138 (H) 65 - 99 mg/dL  Wound culture     Status: None   Collection Time: 04/19/15  8:59 AM  Result Value Ref Range   Specimen Description WOUND    Special Requests NONE    Gram Stain      FEW WBC SEEN FEW GRAM NEGATIVE RODS FEW GRAM POSITIVE COCCI IN PAIRS    Culture      HEAVY GROWTH ENTEROCOCCUS FAECALIS LIGHT GROWTH STAPHYLOCOCCUS AUREUS LIGHT GROWTH STENOTROPHOMONAS MALTOPHILIA    Report Status 04/24/2015 FINAL    Organism ID, Bacteria ENTEROCOCCUS FAECALIS    Organism ID, Bacteria STAPHYLOCOCCUS AUREUS    Organism ID, Bacteria STENOTROPHOMONAS MALTOPHILIA       Susceptibility   Staphylococcus aureus - MIC*    CIPROFLOXACIN <=0.5 SENSITIVE Sensitive     ERYTHROMYCIN 2 RESISTANT Resistant     GENTAMICIN <=0.5 SENSITIVE Sensitive     OXACILLIN 0.5 SENSITIVE Sensitive     TRIMETH/SULFA <=10 SENSITIVE Sensitive     CLINDAMYCIN <=0.25 RESISTANT Resistant      CEFOXITIN SCREEN NEGATIVE Sensitive     Inducible Clindamycin Value in next row Resistant      POSITIVEINDUCIBLE CLINDAMYCIN RESISTANCE - A positive ICR test is indicative of inducible resistance to macrolides, lincosamides, and type B streptogramin.  This isolate is presumed to be resistant to Clindamycin, however, Clindamycin may still be effective in some patients.     TETRACYCLINE Value in next row Sensitive      SENSITIVE<=1    LEVOFLOXACIN Value in next row Sensitive      SENSITIVE0.25    LINEZOLID Value in next row Sensitive      SENSITIVE2    * LIGHT GROWTH STAPHYLOCOCCUS  AUREUS   Stenotrophomonas maltophilia - MIC*    LEVOFLOXACIN Value in next row Sensitive      SENSITIVE2    TRIMETH/SULFA Value in next row Sensitive      SENSITIVE2    * LIGHT GROWTH STENOTROPHOMONAS MALTOPHILIA   Enterococcus faecalis - MIC*    AMPICILLIN Value in next row Sensitive      SENSITIVE2    LINEZOLID Value in next row Sensitive      SENSITIVE2    * HEAVY GROWTH ENTEROCOCCUS FAECALIS  Potassium [facility] vascular lab only)     Status: None   Collection Time: 04/27/15  9:25 AM  Result Value Ref Range   Potassium [facility] vascular lab) 3.2      Assessment & Plan  1. Type 2 diabetes mellitus with chronic kidney disease on chronic dialysis, with long-term current use of insulin (HCC)  - Insulin Detemir (LEVEMIR FLEXTOUCH) 100 UNIT/ML Pen; Inject 36 Units into the skin daily at 10 pm.  Dispense: 9 pen; Refill: 12  2. Essential (primary) hypertension -continue current meds  3. Chronic kidney disease (CKD), stage IV (severe) (HCC) -continue dialysis

## 2015-05-30 NOTE — Addendum Note (Signed)
Addended by: Alease FrameARTER, Nicklos Gaxiola S on: 05/30/2015 09:11 AM   Modules accepted: Orders

## 2015-05-31 ENCOUNTER — Inpatient Hospital Stay
Admission: EM | Admit: 2015-05-31 | Discharge: 2015-06-03 | DRG: 638 | Disposition: A | Discharge status: Home / Self Care | Payer: BC Managed Care – PPO | Attending: Internal Medicine | Admitting: Internal Medicine

## 2015-05-31 ENCOUNTER — Other Ambulatory Visit
Admission: RE | Admit: 2015-05-31 | Discharge: 2015-05-31 | Disposition: A | Discharge status: Home / Self Care | Payer: BC Managed Care – PPO | Source: Ambulatory Visit | Attending: Surgery | Admitting: Surgery

## 2015-05-31 ENCOUNTER — Encounter: Payer: BC Managed Care – PPO | Attending: Surgery | Admitting: Surgery

## 2015-05-31 DIAGNOSIS — Z794 Long term (current) use of insulin: Secondary | ICD-10-CM

## 2015-05-31 DIAGNOSIS — Z888 Allergy status to other drugs, medicaments and biological substances status: Secondary | ICD-10-CM

## 2015-05-31 DIAGNOSIS — L97819 Non-pressure chronic ulcer of other part of right lower leg with unspecified severity: Secondary | ICD-10-CM | POA: Diagnosis present

## 2015-05-31 DIAGNOSIS — N186 End stage renal disease: Secondary | ICD-10-CM | POA: Diagnosis present

## 2015-05-31 DIAGNOSIS — L89322 Pressure ulcer of left buttock, stage 2: Secondary | ICD-10-CM | POA: Diagnosis present

## 2015-05-31 DIAGNOSIS — I120 Hypertensive chronic kidney disease with stage 5 chronic kidney disease or end stage renal disease: Secondary | ICD-10-CM | POA: Diagnosis present

## 2015-05-31 DIAGNOSIS — Z825 Family history of asthma and other chronic lower respiratory diseases: Secondary | ICD-10-CM | POA: Diagnosis not present

## 2015-05-31 DIAGNOSIS — Z809 Family history of malignant neoplasm, unspecified: Secondary | ICD-10-CM

## 2015-05-31 DIAGNOSIS — E11622 Type 2 diabetes mellitus with other skin ulcer: Principal | ICD-10-CM | POA: Diagnosis present

## 2015-05-31 DIAGNOSIS — L03116 Cellulitis of left lower limb: Secondary | ICD-10-CM | POA: Diagnosis present

## 2015-05-31 DIAGNOSIS — L03119 Cellulitis of unspecified part of limb: Secondary | ICD-10-CM

## 2015-05-31 DIAGNOSIS — D631 Anemia in chronic kidney disease: Secondary | ICD-10-CM | POA: Diagnosis present

## 2015-05-31 DIAGNOSIS — L03115 Cellulitis of right lower limb: Secondary | ICD-10-CM | POA: Diagnosis present

## 2015-05-31 DIAGNOSIS — E1122 Type 2 diabetes mellitus with diabetic chronic kidney disease: Secondary | ICD-10-CM | POA: Diagnosis present

## 2015-05-31 DIAGNOSIS — L0390 Cellulitis, unspecified: Secondary | ICD-10-CM

## 2015-05-31 DIAGNOSIS — Z992 Dependence on renal dialysis: Secondary | ICD-10-CM

## 2015-05-31 DIAGNOSIS — I878 Other specified disorders of veins: Secondary | ICD-10-CM | POA: Diagnosis present

## 2015-05-31 DIAGNOSIS — L97829 Non-pressure chronic ulcer of other part of left lower leg with unspecified severity: Secondary | ICD-10-CM | POA: Diagnosis present

## 2015-05-31 DIAGNOSIS — I872 Venous insufficiency (chronic) (peripheral): Secondary | ICD-10-CM | POA: Diagnosis present

## 2015-05-31 DIAGNOSIS — L97212 Non-pressure chronic ulcer of right calf with fat layer exposed: Secondary | ICD-10-CM | POA: Insufficient documentation

## 2015-05-31 DIAGNOSIS — L97222 Non-pressure chronic ulcer of left calf with fat layer exposed: Secondary | ICD-10-CM | POA: Insufficient documentation

## 2015-05-31 DIAGNOSIS — R600 Localized edema: Secondary | ICD-10-CM | POA: Insufficient documentation

## 2015-05-31 DIAGNOSIS — N2581 Secondary hyperparathyroidism of renal origin: Secondary | ICD-10-CM | POA: Diagnosis present

## 2015-05-31 DIAGNOSIS — L97909 Non-pressure chronic ulcer of unspecified part of unspecified lower leg with unspecified severity: Secondary | ICD-10-CM

## 2015-05-31 LAB — GLUCOSE, CAPILLARY
GLUCOSE-CAPILLARY: 224 mg/dL — AB (ref 65–99)
GLUCOSE-CAPILLARY: 215 mg/dL — AB (ref 65–99)
Glucose-Capillary: 199 mg/dL — ABNORMAL HIGH (ref 65–99)

## 2015-05-31 LAB — COMPREHENSIVE METABOLIC PANEL
ALT: 11 U/L — ABNORMAL LOW (ref 14–54)
ANION GAP: 8 (ref 5–15)
AST: 14 U/L — AB (ref 15–41)
Albumin: 2.8 g/dL — ABNORMAL LOW (ref 3.5–5.0)
Alkaline Phosphatase: 123 U/L (ref 38–126)
BUN: 22 mg/dL — AB (ref 6–20)
CHLORIDE: 104 mmol/L (ref 101–111)
CO2: 26 mmol/L (ref 22–32)
Calcium: 8.4 mg/dL — ABNORMAL LOW (ref 8.9–10.3)
Creatinine, Ser: 3.47 mg/dL — ABNORMAL HIGH (ref 0.44–1.00)
GFR, EST AFRICAN AMERICAN: 15 mL/min — AB (ref >60)
GFR, EST NON AFRICAN AMERICAN: 13 mL/min — AB (ref >60)
Glucose, Bld: 151 mg/dL — ABNORMAL HIGH (ref 65–99)
POTASSIUM: 3.4 mmol/L — AB (ref 3.5–5.1)
Sodium: 138 mmol/L (ref 135–145)
Total Bilirubin: 0.5 mg/dL (ref 0.3–1.2)
Total Protein: 7.1 g/dL (ref 6.5–8.1)

## 2015-05-31 LAB — CBC WITH DIFFERENTIAL/PLATELET
BASOS PCT: 1 %
Basophils Absolute: 0.1 10*3/uL (ref 0–0.1)
EOS ABS: 0.5 10*3/uL (ref 0–0.7)
Eosinophils Relative: 4 %
HEMATOCRIT: 33.6 % — AB (ref 35.0–47.0)
HEMOGLOBIN: 10.6 g/dL — AB (ref 12.0–16.0)
LYMPHS ABS: 1.8 10*3/uL (ref 1.0–3.6)
Lymphocytes Relative: 16 %
MCH: 27.8 pg (ref 26.0–34.0)
MCHC: 31.5 g/dL — AB (ref 32.0–36.0)
MCV: 88.1 fL (ref 80.0–100.0)
Monocytes Absolute: 1 10*3/uL — ABNORMAL HIGH (ref 0.2–0.9)
Monocytes Relative: 9 %
NEUTROS ABS: 8 10*3/uL — AB (ref 1.4–6.5)
NEUTROS PCT: 70 %
PLATELETS: 464 10*3/uL — AB (ref 150–440)
RBC: 3.82 MIL/uL (ref 3.80–5.20)
RDW: 16.8 % — AB (ref 11.5–14.5)
WBC: 11.4 10*3/uL — ABNORMAL HIGH (ref 3.6–11.0)

## 2015-05-31 MED ORDER — CARVEDILOL 6.25 MG PO TABS
3.1250 mg | ORAL_TABLET | Freq: Two times a day (BID) | ORAL | Status: DC
  Administered 2015-05-31 – 2015-06-01 (×2): 3.125 mg via ORAL
  Filled 2015-05-31 (×3): qty 1

## 2015-05-31 MED ORDER — VANCOMYCIN HCL IN DEXTROSE 1-5 GM/200ML-% IV SOLN: 1000.0000 mg | INTRAVENOUS | Status: DC | PRN

## 2015-05-31 MED ORDER — INSULIN DETEMIR 100 UNIT/ML ~~LOC~~ SOLN
36.0000 [IU] | Freq: Every day | SUBCUTANEOUS | Status: DC
  Administered 2015-05-31 – 2015-06-02 (×3): 36 [IU] via SUBCUTANEOUS
  Filled 2015-05-31 (×5): qty 0.36

## 2015-05-31 MED ORDER — OXYCODONE HCL 5 MG PO TABS
5.0000 mg | ORAL_TABLET | ORAL | Status: DC | PRN
  Administered 2015-05-31 – 2015-06-02 (×3): 5 mg via ORAL
  Filled 2015-05-31 (×3): qty 1

## 2015-05-31 MED ORDER — INSULIN DETEMIR 100 UNIT/ML FLEXPEN: 36.0000 [IU] | PEN_INJECTOR | Freq: Every day | SUBCUTANEOUS | Status: DC

## 2015-05-31 MED ORDER — SODIUM CHLORIDE 0.9 % IV BOLUS (SEPSIS)
1000.0000 mL | Freq: Once | INTRAVENOUS | Status: AC
  Administered 2015-05-31: 1000 mL via INTRAVENOUS

## 2015-05-31 MED ORDER — HEPARIN SODIUM (PORCINE) 5000 UNIT/ML IJ SOLN
5000.0000 [IU] | Freq: Three times a day (TID) | INTRAMUSCULAR | Status: DC
  Administered 2015-05-31 – 2015-06-03 (×8): 5000 [IU] via SUBCUTANEOUS
  Filled 2015-05-31 (×7): qty 1

## 2015-05-31 MED ORDER — MORPHINE SULFATE (PF) 2 MG/ML IV SOLN
2.0000 mg | INTRAVENOUS | Status: DC | PRN
  Administered 2015-06-01 (×2): 2 mg via INTRAVENOUS
  Filled 2015-05-31 (×2): qty 1

## 2015-05-31 MED ORDER — FUROSEMIDE 40 MG PO TABS
80.0000 mg | ORAL_TABLET | Freq: Every day | ORAL | Status: DC
  Administered 2015-05-31 – 2015-06-03 (×4): 80 mg via ORAL
  Filled 2015-05-31 (×4): qty 2

## 2015-05-31 MED ORDER — LORATADINE 10 MG PO TABS
10.0000 mg | ORAL_TABLET | Freq: Every day | ORAL | Status: DC
  Administered 2015-06-01: 10 mg via ORAL
  Filled 2015-05-31 (×2): qty 1

## 2015-05-31 MED ORDER — ACETAMINOPHEN 325 MG PO TABS: 650.0000 mg | ORAL_TABLET | Freq: Four times a day (QID) | ORAL | Status: DC | PRN

## 2015-05-31 MED ORDER — INSULIN ASPART 100 UNIT/ML ~~LOC~~ SOLN
0.0000 [IU] | Freq: Three times a day (TID) | SUBCUTANEOUS | Status: DC
  Administered 2015-05-31: 3 [IU] via SUBCUTANEOUS
  Administered 2015-06-01: 1 [IU] via SUBCUTANEOUS
  Administered 2015-06-01: 3 [IU] via SUBCUTANEOUS
  Administered 2015-06-02 (×2): 1 [IU] via SUBCUTANEOUS
  Administered 2015-06-03: 2 [IU] via SUBCUTANEOUS
  Filled 2015-05-31: qty 3
  Filled 2015-05-31: qty 5
  Filled 2015-05-31: qty 1
  Filled 2015-05-31: qty 2
  Filled 2015-05-31 (×2): qty 1

## 2015-05-31 MED ORDER — VANCOMYCIN HCL 10 G IV SOLR
2000.0000 mg | Freq: Once | INTRAVENOUS | Status: AC
  Administered 2015-05-31: 2000 mg via INTRAVENOUS
  Filled 2015-05-31: qty 2000

## 2015-05-31 MED ORDER — LOSARTAN POTASSIUM 50 MG PO TABS
50.0000 mg | ORAL_TABLET | Freq: Every day | ORAL | Status: DC
  Administered 2015-05-31 – 2015-06-01 (×2): 50 mg via ORAL
  Filled 2015-05-31 (×2): qty 1

## 2015-05-31 MED ORDER — PIPERACILLIN-TAZOBACTAM 3.375 G IVPB
3.3750 g | Freq: Two times a day (BID) | INTRAVENOUS | Status: DC
  Administered 2015-05-31 – 2015-06-03 (×6): 3.375 g via INTRAVENOUS
  Filled 2015-05-31 (×7): qty 50

## 2015-05-31 MED ORDER — ONDANSETRON HCL 4 MG/2ML IJ SOLN
4.0000 mg | INTRAMUSCULAR | Status: DC | PRN
  Administered 2015-06-02: 4 mg via INTRAVENOUS
  Filled 2015-05-31: qty 2

## 2015-05-31 MED ORDER — PIPERACILLIN-TAZOBACTAM 3.375 G IVPB 30 MIN
3.3750 g | Freq: Once | INTRAVENOUS | Status: AC
  Administered 2015-05-31: 3.375 g via INTRAVENOUS
  Filled 2015-05-31: qty 50

## 2015-05-31 NOTE — H&P (Signed)
[facility] [place] [facility]   PATIENT NAME: [name]    MR#:  [id number]  DATE OF BIRTH:  02/12/1956  DATE OF ADMISSION:  05/31/2015  PRIMARY CARE PHYSICIAN: Fidel Levy, MD   REQUESTING/REFERRING PHYSICIAN:  Sharman Cheek  CHIEF COMPLAINT:   Chief Complaint  Patient presents with  . Wound Check    HISTORY OF PRESENT ILLNESS: [name]  is a 59 y.o. female with a known history of diabetes, hypertension, end-stage renal disease on hemodialysis- has leg ulcers and infections for last 5-6 months has been following with wound care center and she has home health and visiting nurse coming at home for regular dressing changes. Last time she received antibiotic was a month ago for that. She went to visit the wound care center today and by looking at her want to have as it looked infected and in need of the debritement- the Center to emergency room and ER physician after speaking to vascular surgery called for admission. Patient denies any fever or pain or any other complaints.  PAST MEDICAL HISTORY:   Past Medical History  Diagnosis Date  . Chronic kidney disease   . Hypertension   . Diabetes mellitus without complication (HCC)     PAST SURGICAL HISTORY:  Past Surgical History  Procedure Laterality Date  . Eye surgery  bil cataracts  . Av fistula placement Left 11/23/2014    Procedure: ARTERIOVENOUS (AV) FISTULA CREATION;  Surgeon: Annice Needy, MD;  Location: [facility] ORS;  Service: Vascular;  Laterality: Left;  . Peripheral vascular catheterization N/A 02/10/2015    Procedure: Dialysis/Perma Catheter Insertion;  Surgeon: Annice Needy, MD;  Location: [facility] INVASIVE CV LAB;  Service: Cardiovascular;  Laterality: N/A;  . Peripheral vascular catheterization N/A 03/13/2015    Procedure: A/V Shuntogram/Fistulagram;  Surgeon: Annice Needy, MD;  Location: [facility] INVASIVE CV LAB;  Service: Cardiovascular;  Laterality: N/A;  . Peripheral vascular  catheterization N/A 03/13/2015    Procedure: A/V Shunt Intervention;  Surgeon: Annice Needy, MD;  Location: [facility] INVASIVE CV LAB;  Service: Cardiovascular;  Laterality: N/A;  . Peripheral vascular catheterization Left 04/27/2015    Procedure: A/V Shuntogram/Fistulagram;  Surgeon: Annice Needy, MD;  Location: [facility] INVASIVE CV LAB;  Service: Cardiovascular;  Laterality: Left;  . Peripheral vascular catheterization N/A 04/27/2015    Procedure: A/V Shunt Intervention;  Surgeon: Annice Needy, MD;  Location: [facility] INVASIVE CV LAB;  Service: Cardiovascular;  Laterality: N/A;  . Peripheral vascular catheterization N/A 05/11/2015    Procedure: Dialysis/Perma Catheter Removal;  Surgeon: Annice Needy, MD;  Location: [facility] INVASIVE CV LAB;  Service: Cardiovascular;  Laterality: N/A;    SOCIAL HISTORY:  Social History  Substance Use Topics  . Smoking status: Never Smoker   . Smokeless tobacco: Never Used  . Alcohol Use: No    FAMILY HISTORY:  Family History  Problem Relation Age of Onset  . COPD Mother   . Cancer Father     DRUG ALLERGIES:  Allergies  Allergen Reactions  . Codeine Nausea Only    hydrocodone  . Vicodin [Hydrocodone-Acetaminophen] Nausea And Vomiting    REVIEW OF SYSTEMS:   CONSTITUTIONAL: No fever, fatigue or weakness.  EYES: No blurred or double vision.  EARS, NOSE, AND THROAT: No tinnitus or ear pain.  RESPIRATORY: No cough, shortness of breath, wheezing or hemoptysis.  CARDIOVASCULAR: No chest pain, orthopnea, edema.  GASTROINTESTINAL: No nausea, vomiting, diarrhea or abdominal pain.  GENITOURINARY: No dysuria, hematuria.  ENDOCRINE: No polyuria, nocturia,  HEMATOLOGY: No anemia, easy bruising or bleeding SKIN: bad ulcers and some discharge is on both legs  MUSCULOSKELETAL: No joint pain or arthritis.   NEUROLOGIC: No tingling, numbness, weakness.  PSYCHIATRY: No anxiety or depression.   MEDICATIONS AT HOME:  Prior to Admission medications   Medication Sig Start Date  End Date Taking? Authorizing Provider  carvedilol (COREG) 3.125 MG tablet TAKE ONE TABLET BY MOUTH TWICE DAILY Patient taking differently: TAKE ONE TABLET BY MOUTH TWICE DAILY on Tuesday, Thursday, Saturday, and Sunday 04/10/15  Yes Janeann Forehand., MD  carvedilol (COREG) 3.125 MG tablet Take 3.125 mg by mouth every evening. On Monday, Wednesday, and Friday   Yes Historical Provider, MD  furosemide (LASIX) 80 MG tablet Take 80 mg by mouth daily. On Tuesday, Thursday, Saturday, and Sunday   Yes Historical Provider, MD  Insulin Detemir (LEVEMIR FLEXTOUCH) 100 UNIT/ML Pen Inject 36 Units into the skin daily at 10 pm. Patient taking differently: Inject 36 Units into the skin at bedtime.  05/30/15  Yes Janeann Forehand., MD  Lancets (FREESTYLE) lancets USE TO CHECK BLOOD SUGAR 4 TIMES DAILY 04/03/15  Yes Janeann Forehand., MD  lidocaine-prilocaine (EMLA) cream Apply 1 application topically 3 (three) times a week. On Monday, Wednesday, and Friday, before dialysis   Yes Historical Provider, MD  loratadine (CLARITIN) 10 MG tablet Take 10 mg by mouth daily.   Yes Historical Provider, MD  losartan (COZAAR) 50 MG tablet TAKE ONE TABLET BY MOUTH ONCE DAILY 04/03/15  Yes Janeann Forehand., MD  SANTYL ointment Apply 1 application topically 3 (three) times daily.   Yes Historical Provider, MD      PHYSICAL EXAMINATION:   VITAL SIGNS: Blood pressure 124/90, pulse 93, temperature 97.6 F (36.4 C), temperature source Oral, resp. rate 18, height  (1.727 m), weight 117.935 kg (260 lb), SpO2 100 %.  GENERAL:  59 y.o.-year-old patient lying in the bed with no acute distress.  EYES: Pupils equal, round, reactive to light and accommodation. No scleral icterus. Extraocular muscles intact.  HEENT: Head atraumatic, normocephalic. Oropharynx and nasopharynx clear.  NECK:  Supple, no jugular venous distention. No thyroid enlargement, no tenderness.  LUNGS: Normal breath sounds bilaterally, no wheezing,  rales,rhonchi or crepitation. No use of accessory muscles of respiration.  CARDIOVASCULAR: S1, S2 normal. Systolic murmurs present, no rubs, or gallops.  ABDOMEN: Soft, nontender, nondistended. Bowel sounds present. No organomegaly or mass.  EXTREMITIES: No pedal edema, both legs have multiple ulcers with yellowish slough covering the area, the biggest one is on the lateral aspect of the left leg which is 4-5 cm in diameter in going 1-2 cm deep involving muscles. There is some clear secretions draining down from all these ulcers . NEUROLOGIC: Cranial nerves II through XII are intact. Muscle strength 5/5 in all extremities. Sensation intact. Gait not checked.  PSYCHIATRIC: The patient is alert and oriented x 3.  SKIN:  areas on both legs.  LABORATORY PANEL:   CBC  Recent Labs Lab 05/31/15 0959  WBC 11.4*  HGB 10.6*  HCT 33.6*  PLT 464*  MCV 88.1  MCH 27.8  MCHC 31.5*  RDW 16.8*  LYMPHSABS 1.8  MONOABS 1.0*  EOSABS 0.5  BASOSABS 0.1   ------------------------------------------------------------------------------------------------------------------  Chemistries   Recent Labs Lab 05/31/15 0959  NA 138  K 3.4*  CL 104  CO2 26  GLUCOSE 151*  BUN 22*  CREATININE 3.47*  CALCIUM 8.4*  AST 14*  ALT 11*  ALKPHOS 123  BILITOT 0.5   ------------------------------------------------------------------------------------------------------------------ estimated creatinine clearance is 23.3 mL/min (by C-G formula based on Cr of 3.47). ------------------------------------------------------------------------------------------------------------------ No results for input(s): TSH, T4TOTAL, T3FREE, THYROIDAB in the last 72 hours.  Invalid input(s): FREET3   Coagulation profile No results for input(s): INR, PROTIME in the last 168 hours. ------------------------------------------------------------------------------------------------------------------- No results for input(s): DDIMER  in the last 72 hours. -------------------------------------------------------------------------------------------------------------------  Cardiac Enzymes No results for input(s): CKMB, TROPONINI, MYOGLOBIN in the last 168 hours.  Invalid input(s): CK ------------------------------------------------------------------------------------------------------------------ Invalid input(s): POCBNP  ---------------------------------------------------------------------------------------------------------------  Urinalysis    Component Value Date/Time   COLORURINE Yellow 10/29/2011 1600   APPEARANCEUR Hazy 10/29/2011 1600   LABSPEC 1.012 10/29/2011 1600   PHURINE 5.0 10/29/2011 1600   GLUCOSEU Negative 10/29/2011 1600   HGBUR 2+ 10/29/2011 1600   BILIRUBINUR Negative 10/29/2011 1600   KETONESUR Negative 10/29/2011 1600   PROTEINUR 30 mg/dL 11/91/478204/16/2013 [id number]   NITRITE Negative 10/29/2011 1600   LEUKOCYTESUR Trace 10/29/2011 1600     RADIOLOGY: No results found.  IMPRESSION AND PLAN: * Cellulitis and diabetic ulcers on the legs with infection  Admit to medical surgical floor, IV vancomycin and Zosyn for now.  Vascular surgery consult to help with debridement.  She may need wound care consult in hospital, she is already following with wound care clinic as outpatient.  * ESRD on HD   Nephrology consult to help do HD here.  * DM   Cont lantus, and Insulin sliding scale coverage.  * Htn   Cont coreg. Stable.   All the records are reviewed and case discussed with ED provider. Management plans discussed with the patient, family and they are in agreement.  CODE STATUS: Advance Directive Documentation        Most Recent Value   Type of Advance Directive  Healthcare Power of Attorney, Living will   Pre-existing out of facility DNR order (yellow form or pink MOST form)     "MOST" Form in Place?         TOTAL TIME TAKING CARE OF THIS PATIENT: 50 minutes.    Altamese DillingVACHHANI, Daphyne Miguez  M.D on 05/31/2015   Between 7am to 6pm - Pager - [phone]  After 6pm go to [email or web address] - password [email or web address] [facility]  [facility]  Office  [phone]  CC: Primary care physician; Fidel LevyJames Hawkins Jr, MD   Note: This dictation was prepared with Dragon dictation along with smaller phrase technology. Any transcriptional errors that result from this process are unintentional.

## 2015-05-31 NOTE — ED Notes (Signed)
Pt sent from wound center for admission for wounds on BL LE.the patient ambulatory to triage..Marland Kitchen

## 2015-05-31 NOTE — ED Notes (Signed)
Pt states dialysis schedule is Monday, Wednesday, Friday

## 2015-05-31 NOTE — Progress Notes (Signed)
Dr. Clint GuyHower notified of persistant LE pain, no pain meds ordered on admission.  Pt. States she takes Tylenol at home. Windy Carinaurner,Vannesa Abair K, RN; 10:16 PM; 05/31/2015

## 2015-05-31 NOTE — ED Provider Notes (Signed)
[facility] Emergency Department Provider Note  ____________________________________________  Time seen: 9:45 AM  I have reviewed the triage vital signs and the nursing notes.   HISTORY  Chief Complaint Wound Check    HPI [name] is a 59 y.o. female who comes to the ED from the wound care center due to worsening bilateral lower extremity cellulitis with necrotic ulcerations in the legs. I received a call from Dr. Annamarie Major, the wound center surgeon reports that the patient likely has venous stasis disease and that with this ongoing necrosis and worsening cellulitis eating the patient needs admission for IV antibiotics and surgical debridement. Patient denies any chest pain shortness of breath dizziness or syncope. No fevers or chills. She is tolerating oral intake but complains of severe bilateral lower extremity pain. She has a history of surgical debridements in the past. She also has end-stage renal disease and is on dialysis.     Past Medical History  Diagnosis Date  . Chronic kidney disease   . Hypertension   . Diabetes mellitus without complication [facility])      Patient Active Problem List   Diagnosis Date Noted  . Acute pansinusitis 02/23/2015  . Chronic kidney disease (CKD), stage IV (severe) (HCC) 02/23/2015  . Diabetes mellitus, type 2 (HCC) 02/23/2015  . Essential (primary) hypertension 02/23/2015  . Flu vaccine need 02/23/2015  . Encounter for general adult medical examination without abnormal findings 02/23/2015  . Need for vaccination 02/23/2015     Past Surgical History  Procedure Laterality Date  . Eye surgery  bil cataracts  . Av fistula placement Left 11/23/2014    Procedure: ARTERIOVENOUS (AV) FISTULA CREATION;  Surgeon: Annice Needy, MD;  Location: [facility] ORS;  Service: Vascular;  Laterality: Left;  . Peripheral vascular catheterization N/A 02/10/2015    Procedure: Dialysis/Perma Catheter Insertion;  Surgeon: Annice Needy,  MD;  Location: [facility] INVASIVE CV LAB;  Service: Cardiovascular;  Laterality: N/A;  . Peripheral vascular catheterization N/A 03/13/2015    Procedure: A/V Shuntogram/Fistulagram;  Surgeon: Annice Needy, MD;  Location: [facility] INVASIVE CV LAB;  Service: Cardiovascular;  Laterality: N/A;  . Peripheral vascular catheterization N/A 03/13/2015    Procedure: A/V Shunt Intervention;  Surgeon: Annice Needy, MD;  Location: [facility] INVASIVE CV LAB;  Service: Cardiovascular;  Laterality: N/A;  . Peripheral vascular catheterization Left 04/27/2015    Procedure: A/V Shuntogram/Fistulagram;  Surgeon: Annice Needy, MD;  Location: [facility] INVASIVE CV LAB;  Service: Cardiovascular;  Laterality: Left;  . Peripheral vascular catheterization N/A 04/27/2015    Procedure: A/V Shunt Intervention;  Surgeon: Annice Needy, MD;  Location: [facility] INVASIVE CV LAB;  Service: Cardiovascular;  Laterality: N/A;  . Peripheral vascular catheterization N/A 05/11/2015    Procedure: Dialysis/Perma Catheter Removal;  Surgeon: Annice Needy, MD;  Location: [facility] INVASIVE CV LAB;  Service: Cardiovascular;  Laterality: N/A;     Current Outpatient Rx  Name  Route  Sig  Dispense  Refill  . carvedilol (COREG) 3.125 MG tablet      TAKE ONE TABLET BY MOUTH TWICE DAILY Patient taking differently: TAKE ONE TABLET BY MOUTH TWICE DAILY on Tuesday, Thursday, Saturday, and Sunday   60 tablet   6   . carvedilol (COREG) 3.125 MG tablet   Oral   Take 3.125 mg by mouth every evening. On Monday, Wednesday, and Friday         . furosemide (LASIX) 80 MG tablet   Oral   Take 80 mg  by mouth daily. On Tuesday, Thursday, Saturday, and Sunday         . Insulin Detemir (LEVEMIR FLEXTOUCH) 100 UNIT/ML Pen   Subcutaneous   Inject 36 Units into the skin daily at 10 pm. Patient taking differently: Inject 36 Units into the skin at bedtime.    9 pen   12   . Lancets (FREESTYLE) lancets      USE TO CHECK BLOOD SUGAR 4 TIMES DAILY   200 each   12   .  lidocaine-prilocaine (EMLA) cream   Topical   Apply 1 application topically 3 (three) times a week. On Monday, Wednesday, and Friday, before dialysis         . loratadine (CLARITIN) 10 MG tablet   Oral   Take 10 mg by mouth daily.         Marland Kitchen losartan (COZAAR) 50 MG tablet      TAKE ONE TABLET BY MOUTH ONCE DAILY   30 tablet   12   . SANTYL ointment   Topical   Apply 1 application topically 3 (three) times daily.           Dispense as written.      Allergies Codeine and Vicodin   Family History  Problem Relation Age of Onset  . COPD Mother   . Cancer Father     Social History Social History  Substance Use Topics  . Smoking status: Never Smoker   . Smokeless tobacco: Never Used  . Alcohol Use: No    Review of Systems  Constitutional:   No fever or chills. No weight changes Eyes:   No blurry vision or double vision.  ENT:   No sore throat. Cardiovascular:   No chest pain. Respiratory:   No dyspnea or cough. Gastrointestinal:   Negative for abdominal pain, vomiting and diarrhea.  No BRBPR or melena. Genitourinary:   Negative for dysuria, urinary retention, bloody urine, or difficulty urinating. Musculoskeletal:   Positive lower extremity swelling and pain bilaterally. Skin:   Negative for rash. Multiple ulcerations. Neurological:   Negative for headaches, focal weakness or numbness. Psychiatric:  No anxiety or depression.   Endocrine:  No hot/cold intolerance, changes in energy, or sleep difficulty.  10-point ROS otherwise negative.  ____________________________________________   PHYSICAL EXAM:  VITAL SIGNS: ED Triage Vitals  Enc Vitals Group     BP 05/31/15 0934 158/93 mmHg     Pulse Rate 05/31/15 0934 103     Resp 05/31/15 0934 18     Temp 05/31/15 0934 97.6 F (36.4 C)     Temp Source 05/31/15 0934 Oral     SpO2 05/31/15 0934 100 %     Weight 05/31/15 0934 260 lb (117.935 kg)     Height 05/31/15 0934  (1.727 m)     Head Cir --       Peak Flow --      Pain Score 05/31/15 0917 3     Pain Loc --      Pain Edu? --      Excl. in GC? --      Constitutional:   Alert and oriented. Mild distress due to pain Eyes:   No scleral icterus. No conjunctival pallor. PERRL. EOMI ENT   Head:   Normocephalic and atraumatic.   Nose:   No congestion/rhinnorhea. No septal hematoma   Mouth/Throat:   MMM, no pharyngeal erythema. No peritonsillar mass. No uvula shift.   Neck:   No stridor. No SubQ emphysema.  No meningismus. Hematological/Lymphatic/Immunilogical:   No cervical lymphadenopathy. Cardiovascular:   Tachycardia heart rate 105. Normal and symmetric distal pulses are present in all extremities. No murmurs, rubs, or gallops. Respiratory:   Normal respiratory effort without tachypnea nor retractions. Breath sounds are clear and equal bilaterally. No wheezes/rales/rhonchi. Gastrointestinal:   Soft and nontender. No distention. There is no CVA tenderness.  No rebound, rigidity, or guarding. Genitourinary:   deferred Musculoskeletal:   Multiple large soft tissue ulcerations in the bilateral lower extremities over the shins and calves. These are stage III to unstageable due to being filled with necrotic soft tissue. Expressing the wounds does not reveal any purulent drainage. They're exquisitely tender to the touch. There is no crepitus. No lymphangitis. Neurologic:   Normal speech and language.  CN 2-10 normal. Motor grossly intact. No pronator drift.  Normal gait. No gross focal neurologic deficits are appreciated.  Skin:    Skin is warm, dry and intact. Warmth and erythema on bilateral lower extremities. Psychiatric:   Mood and affect are normal. Speech and behavior are normal. Patient exhibits appropriate insight and judgment.  ____________________________________________    LABS (pertinent positives/negatives) (all labs ordered are listed, but only abnormal results are displayed) Labs Reviewed  CBC WITH  DIFFERENTIAL/PLATELET - Abnormal; Notable for the following:    WBC 11.4 (*)    Hemoglobin 10.6 (*)    HCT 33.6 (*)    MCHC 31.5 (*)    RDW 16.8 (*)    Platelets 464 (*)    Neutro Abs 8.0 (*)    Monocytes Absolute 1.0 (*)    All other components within normal limits  COMPREHENSIVE METABOLIC PANEL - Abnormal; Notable for the following:    Potassium 3.4 (*)    Glucose, Bld 151 (*)    BUN 22 (*)    Creatinine, Ser 3.47 (*)    Calcium 8.4 (*)    Albumin 2.8 (*)    AST 14 (*)    ALT 11 (*)    GFR calc non Af Amer 13 (*)    GFR calc Af Amer 15 (*)    All other components within normal limits   ____________________________________________   EKG    ____________________________________________    RADIOLOGY    ____________________________________________   PROCEDURES   ____________________________________________   INITIAL IMPRESSION / ASSESSMENT AND PLAN / ED COURSE  Pertinent labs & imaging results that were available during my care of the patient were reviewed by me and considered in my medical decision making (see chart for details).  Patient presents with bilateral lower extremity cellulitis with necrotic wounds. Discussed with the hospitalist for admission. I also discussed with Dr. Evaristo BurySchnieder from vascular surgery who reports that he or Dr. due to may be able to operate on the patient tomorrow or Friday. I did also discuss it with general surgery regarding whether they would be comfortable performing a surgical debridement of these wounds, which they defer and recommend having vascular surgery to it. No sepsis at this time. Low suspicion for osteomyelitis necrotizing fasciitis abscess or DVT.     ____________________________________________   FINAL CLINICAL IMPRESSION(S) / ED DIAGNOSES  Final diagnoses:  Cellulitis of lower extremity, unspecified laterality      Sharman CheekPhillip Joleigh Mineau, MD 05/31/15 [phone]

## 2015-05-31 NOTE — ED Notes (Signed)
Sent by wound care center for wound infection

## 2015-05-31 NOTE — Progress Notes (Signed)
ANTIBIOTIC CONSULT NOTE - INITIAL  Pharmacy Consult for vancomycin and piperacillin/tazobactam Indication: Cellulitis  Allergies  Allergen Reactions  . Codeine Nausea Only    hydrocodone  . Vicodin [Hydrocodone-Acetaminophen] Nausea And Vomiting    Patient Measurements: Height: 5\' 8"  (172.7 cm) Weight: 260 lb (117.935 kg) IBW/kg (Calculated) : 63.9 Adjusted Body Weight: 85.5 kg  Vital Signs: Temp: 97.6 F (36.4 C) (11/16 0934) Temp Source: Oral (11/16 0934) BP: 124/90 mmHg (11/16 1152) Pulse Rate: 93 (11/16 1152) Intake/Output from previous day:   Intake/Output from this shift:    Labs:  Recent Labs  05/31/15 0959  WBC 11.4*  HGB 10.6*  PLT 464*  CREATININE 3.47*   Estimated Creatinine Clearance: 23.3 mL/min (by C-G formula based on Cr of 3.47). No results for input(s): VANCOTROUGH, VANCOPEAK, VANCORANDOM, GENTTROUGH, GENTPEAK, GENTRANDOM, TOBRATROUGH, TOBRAPEAK, TOBRARND, AMIKACINPEAK, AMIKACINTROU, AMIKACIN in the last 72 hours.   Microbiology: No results found for this or any previous visit (from the past 720 hour(s)).  Medical History: Past Medical History  Diagnosis Date  . Chronic kidney disease   . Hypertension   . Diabetes mellitus without complication (HCC)     Medications:  Anti-infectives    Start     Dose/Rate Route Frequency Ordered Stop   06/02/15 1200  vancomycin (VANCOCIN) IVPB 1000 mg/200 mL premix     1,000 mg 200 mL/hr over 60 Minutes Intravenous Every Dialysis 05/31/15 1449     05/31/15 2200  piperacillin-tazobactam (ZOSYN) IVPB 3.375 g     3.375 g 12.5 mL/hr over 240 Minutes Intravenous Every 12 hours 05/31/15 1449     05/31/15 1400  piperacillin-tazobactam (ZOSYN) IVPB 3.375 g     3.375 g 100 mL/hr over 30 Minutes Intravenous  Once 05/31/15 1354     05/31/15 1015  vancomycin (VANCOCIN) 2,000 mg in sodium chloride 0.9 % 500 mL IVPB     2,000 mg 250 mL/hr over 120 Minutes Intravenous  Once 05/31/15 1012 05/31/15 1254      Assessment: Pharmacy consulted to dose vancomycin and piperacillin/tazobactam in this 59 year old female for cellulitis. Patient has PMH significant for DM and ESRD on HD and has a history of lower extremity ulcers and infections for the past 5-6 months. Patient has been followed by wound care outpatient.  Patient is on HD with a normal schedule of MWF. Unclear if inpatient HD schedule will also be MWF at this time.   Adjusted body weight = 85.5 kg  Goal of Therapy:  Vancomycin trough level 10-15 mcg/ml  Plan:  Will give piperacillin/tazobactam 3.375 g IV q8h EI based on indication and   Patient received vancomycin 2 g loading dose in ED (~23 mg/kg) Ordered vancomycin 1000 mg to be given at the end of each dialysis session Will follow HD schedule inpatient and will need to order a vancomycin level prior to the 3rd HD session. Pharmacy will continue to monitor and adjust doses as needed. Thank you for the consult.   Jodelle RedMary M Sharnise Blough 05/31/2015,2:51 PM

## 2015-05-31 NOTE — ED Notes (Signed)
Pt sitting up in bed eating, tolerating well, no acute distress noted. Verbalized no further needs at this time

## 2015-05-31 NOTE — ED Notes (Signed)
Pt requesting to eat at this time, pt hx diabetes. RN spoke to MD stafford who verbalized okay to let pt eat, pt given meal tray, sitting up in bed eating and tolerating well. No acute distress noted. Pt verbalized no further needs at this time

## 2015-06-01 LAB — BASIC METABOLIC PANEL
Anion gap: 11 (ref 5–15)
BUN: 27 mg/dL — AB (ref 6–20)
CALCIUM: 8.5 mg/dL — AB (ref 8.9–10.3)
CO2: 24 mmol/L (ref 22–32)
Chloride: 103 mmol/L (ref 101–111)
Creatinine, Ser: 3.75 mg/dL — ABNORMAL HIGH (ref 0.44–1.00)
GFR calc Af Amer: 14 mL/min — ABNORMAL LOW (ref >60)
GFR, EST NON AFRICAN AMERICAN: 12 mL/min — AB (ref >60)
GLUCOSE: 162 mg/dL — AB (ref 65–99)
Potassium: 3.3 mmol/L — ABNORMAL LOW (ref 3.5–5.1)
Sodium: 138 mmol/L (ref 135–145)

## 2015-06-01 LAB — GLUCOSE, CAPILLARY
GLUCOSE-CAPILLARY: 142 mg/dL — AB (ref 65–99)
GLUCOSE-CAPILLARY: 177 mg/dL — AB (ref 65–99)
Glucose-Capillary: 231 mg/dL — ABNORMAL HIGH (ref 65–99)

## 2015-06-01 LAB — CBC
HCT: 30.9 % — ABNORMAL LOW (ref 35.0–47.0)
HEMOGLOBIN: 10.1 g/dL — AB (ref 12.0–16.0)
MCH: 29 pg (ref 26.0–34.0)
MCHC: 32.8 g/dL (ref 32.0–36.0)
MCV: 88.5 fL (ref 80.0–100.0)
Platelets: 416 10*3/uL (ref 150–440)
RBC: 3.49 MIL/uL — ABNORMAL LOW (ref 3.80–5.20)
RDW: 16.9 % — AB (ref 11.5–14.5)
WBC: 10.8 10*3/uL (ref 3.6–11.0)

## 2015-06-01 MED ORDER — EPOETIN ALFA 10000 UNIT/ML IJ SOLN: 4000.0000 [IU] | INTRAMUSCULAR | Status: DC

## 2015-06-01 NOTE — Progress Notes (Signed)
Initial Nutrition Assessment     INTERVENTION:  Meals and snacks: Cater pt preferences.  Pt with concerns regarding carbs on menu.  Discussed options and meal selection with pt, currently on renal/carb diet. Pt has menu currently.  All questions/concerns addressed   NUTRITION DIAGNOSIS:   Increased nutrient needs related to chronic illness, wound healing as evidenced by estimated needs.    GOAL:   Patient will meet greater than or equal to 90% of their needs    MONITOR:    (Energy intake, Electrolyte and renal profile, Glucose profile)  REASON FOR ASSESSMENT:   Consult Diet education  ASSESSMENT:      Pt admitted with lower extremity leg wounds, infected at this time  Past Medical History  Diagnosis Date  . Chronic kidney disease   . Hypertension   . Diabetes mellitus without complication (HCC)      Current Nutrition: eating 100% of meals and tolerating well  Food/Nutrition-Related History: good appetite prior to admission   Scheduled Medications:  . carvedilol  3.125 mg Oral BID  . [START ON 06/03/2015] epoetin (EPOGEN/PROCRIT) injection  4,000 Units Intravenous Q T,Th,Sa-HD  . furosemide  80 mg Oral Daily  . heparin  5,000 Units Subcutaneous 3 times per day  . insulin aspart  0-9 Units Subcutaneous TID WC  . insulin detemir  36 Units Subcutaneous QHS  . loratadine  10 mg Oral Daily  . losartan  50 mg Oral Daily  . piperacillin-tazobactam (ZOSYN)  IV  3.375 g Intravenous Q12H       Electrolyte/Renal Profile and Glucose Profile:   Recent Labs Lab 05/31/15 0959 06/01/15 0355  NA 138 138  K 3.4* 3.3*  CL 104 103  CO2 26 24  BUN 22* 27*  CREATININE 3.47* 3.75*  CALCIUM 8.4* 8.5*  GLUCOSE 151* 162*   Protein Profile:  Recent Labs Lab 05/31/15 0959  ALBUMIN 2.8*    Gastrointestinal Profile: Last BM:11/16   Nutrition-Focused Physical Exam Findings:  Unable to complete Nutrition-Focused physical exam at this time.   Weight Change:  stable wt   Diet Order:  Diet renal/carb modified with fluid restriction Diet-HS Snack?: Nothing; Room service appropriate?: Yes; Fluid consistency:: Thin  Skin:   pressure ulcer noted stage II buttocks   Height:   Ht Readings from Last 1 Encounters:  05/31/15 5\' 6"  (1.676 m)    Weight:   Wt Readings from Last 1 Encounters:  05/31/15 264 lb 9.6 oz (120.022 kg)    Ideal Body Weight:     BMI:  Body mass index is 42.73 kg/(m^2).  Estimated Nutritional Needs:   Kcal:  BEE 1176 kcals (IF 1.1-1.3, AF 1.3) [id number] kcals/d  Protein:  (1.2-1.5 g/kg) 71-89 g/d  Fluid:  (1000ml + UOP)  EDUCATION NEEDS:   Education needs addressed  MODERATE Care Level  [name], RD, LDN [phone] (pager)

## 2015-06-01 NOTE — Progress Notes (Signed)
[facility]  ROUNDING NOTE   Subjective:  Patient well known to us as an outpatient. Presents with lower extremity wounds that have been chronic in nature. It appears that they are now infected. She undergoes dialysis under our care on Monday, Wednesday, Friday.   Objective:  Vital signs in last 24 hours:  Temp:  [97.9 F (36.6 C)-98.4 F (36.9 C)] 98.4 F (36.9 C) (11/17 1251) Pulse Rate:  [77-95] 77 (11/17 1251) Resp:  [14-18] 14 (11/17 1251) BP: (103-128)/(49-74) 103/49 mmHg (11/17 1251) SpO2:  [97 %-100 %] 97 % (11/17 1251) Weight:  [120.022 kg (264 lb 9.6 oz)] 120.022 kg (264 lb 9.6 oz) (11/16 1635)  Weight change:  Filed Weights   05/31/15 0934 05/31/15 1635  Weight: 117.935 kg (260 lb) 120.022 kg (264 lb 9.6 oz)    Intake/Output: I/O last 3 completed shifts: In: 1443 [IV Piggyback:43] Out: 700 [Urine:700]   Intake/Output this shift:  Total I/O In: 360 [P.O.:360] Out: -   Physical Exam: General: NAD, sitting up in chair.  Head: Normocephalic, atraumatic. Moist oral mucosal membranes  Eyes: Anicteric  Neck: Supple, trachea midline  Lungs:  Clear to auscultation normal effort  Heart: Regular rate and rhythm  Abdomen:  Soft, nontender, BS present  Extremities:  3+ peripheral edema, serous drainage noted into dressings  Neurologic: Nonfocal, moving all four extremities  Skin: Wounds on b/l LE's wrapped  Access: LUE AVF    Basic Metabolic Panel:  Recent Labs Lab 05/31/15 0959 06/01/15 0355  NA 138 138  K 3.4* 3.3*  CL 104 103  CO2 26 24  GLUCOSE 151* 162*  BUN 22* 27*  CREATININE 3.47* 3.75*  CALCIUM 8.4* 8.5*    Liver Function Tests:  Recent Labs Lab 05/31/15 0959  AST 14*  ALT 11*  ALKPHOS 123  BILITOT 0.5  PROT 7.1  ALBUMIN 2.8*   No results for input(s): LIPASE, AMYLASE in the last 168 hours. No results for input(s): AMMONIA in the last 168 hours.  CBC:  Recent Labs Lab 05/31/15 0959 06/01/15 0355  WBC 11.4*  10.8  NEUTROABS 8.0*  --   HGB 10.6* 10.1*  HCT 33.6* 30.9*  MCV 88.1 88.5  PLT 464* 416    Cardiac Enzymes: No results for input(s): CKTOTAL, CKMB, CKMBINDEX, TROPONINI in the last 168 hours.  BNP: Invalid input(s): POCBNP  CBG:  Recent Labs Lab 05/31/15 1600 05/31/15 1654 05/31/15 2206 06/01/15 0738 06/01/15 1143  GLUCAP 199* 224* 215* 142* 231*    Microbiology: Results for orders placed or performed during the hospital encounter of 05/31/15  Wound culture     Status: None (Preliminary result)   Collection Time: 05/31/15  8:42 AM  Result Value Ref Range Status   Specimen Description WOUND  Final   Special Requests NONE  Final   Gram Stain   Final    FEW WBC SEEN FEW GRAM NEGATIVE RODS FEW GRAM POSITIVE COCCI IN PAIRS    Culture HOLDING FOR POSSIBLE PATHOGEN  Final   Report Status PENDING  Incomplete    Coagulation Studies: No results for input(s): LABPROT, INR in the last 72 hours.  Urinalysis: No results for input(s): COLORURINE, LABSPEC, PHURINE, GLUCOSEU, HGBUR, BILIRUBINUR, KETONESUR, PROTEINUR, UROBILINOGEN, NITRITE, LEUKOCYTESUR in the last 72 hours.  Invalid input(s): APPERANCEUR    Imaging: No results found.   Medications:     . carvedilol  3.125 mg Oral BID  . [START ON 06/03/2015] epoetin (EPOGEN/PROCRIT) injection  4,000 Units Intravenous Q T,Th,Sa-HD  .  furosemide  80 mg Oral Daily  . heparin  5,000 Units Subcutaneous 3 times per day  . insulin aspart  0-9 Units Subcutaneous TID WC  . insulin detemir  36 Units Subcutaneous QHS  . loratadine  10 mg Oral Daily  . losartan  50 mg Oral Daily  . piperacillin-tazobactam (ZOSYN)  IV  3.375 g Intravenous Q12H   acetaminophen, morphine injection, ondansetron (ZOFRAN) IV, oxyCODONE, [START ON 06/02/2015] vancomycin  Assessment/ Plan:  59 y.o. female 59 year old Caucasian female with past medical history of hypertension, diabetes mellitus, chronic right lower extremity wound, history of  decubitus ulcer, morbid obesity, ESRD on HD MWF, anemia of CKD, SHPTH  1. End-stage renal disease on hemodialysis Monday, Wednesday, Friday. The patient had dialysis treatment yesterday. However she continues to have significant lower extremity edema. We will perform daily dialysis and attempted 1.5-2 kg of ultrafiltration daily. Hopefully this can help her wounds to heal.  2. Anemia of chronic kidney disease. Start the patient on Epogen 4000 units IV 3 times a week.  3. Secondary hyperparathyroidism. Check phosphorus and PTH with dialysis today.  4. Hypertension. Continue the patient on Coreg as well as losartan.  5. Bilateral lower extremity wounds. Significant source of morbidity for the patient. Continue IV antibiotics and local wound care. We will perform ultrafiltration with dialysis to improved lower extremity edema which should also improve the wounds.    LOS: 1 Ezri Fanguy 11/17/20162:15 PM

## 2015-06-01 NOTE — Consult Note (Signed)
[facility] Vascular Consult Note  MRN : [id number]  [name] is a 59 y.o. (October 13, 1955) female who presents with chief complaint of  Cellulitis bilateral lower extremities with multiple venous ulcers.  History of Present Illness: The patient is a 59 y.o. female with a known history of diabetes, hypertension, end-stage renal disease on hemodialysis who has multiple leg ulcers bilaterally.  These wounds have had several infections for last 5-6 months have been followed by the wound care center.  She has home health and visiting nurse coming at home for regular dressing changes. Last time she received antibiotic was a month ago for that. She went to the wound care center yesterday and after evaluation was sent to emergency room.  Patient denies any fever or pain or any other complaints.  The patient states that she sleeps in a chair because it is too painful to place her legs in bed. She states contact of the bed with the posterior aspect of her legs is 10 out of 10 pain.  Current Facility-Administered Medications  Medication Dose Route Frequency Provider Last Rate Last Dose  . acetaminophen (TYLENOL) tablet 650 mg  650 mg Oral Q6H PRN Wyatt Haste, MD      . carvedilol (COREG) tablet 3.125 mg  3.125 mg Oral BID Altamese Dilling, MD   3.125 mg at 06/01/15 1046  . [START ON 06/03/2015] epoetin alfa (EPOGEN,PROCRIT) injection 4,000 Units  4,000 Units Intravenous Q T,Th,Sa-HD Munsoor Lateef, MD      . furosemide (LASIX) tablet 80 mg  80 mg Oral Daily Altamese Dilling, MD   80 mg at 06/01/15 1045  . heparin injection 5,000 Units  5,000 Units Subcutaneous 3 times per day Altamese Dilling, MD   5,000 Units at 06/01/15 1256  . insulin aspart (novoLOG) injection 0-9 Units  0-9 Units Subcutaneous TID WC Altamese Dilling, MD   3 Units at 06/01/15 1256  . insulin detemir (LEVEMIR) injection 36 Units  36 Units Subcutaneous QHS Cindi Carbon, RPH   36  Units at 05/31/15 2200  . loratadine (CLARITIN) tablet 10 mg  10 mg Oral Daily Altamese Dilling, MD   10 mg at 06/01/15 1045  . losartan (COZAAR) tablet 50 mg  50 mg Oral Daily Altamese Dilling, MD   50 mg at 06/01/15 1045  . morphine 2 MG/ML injection 2 mg  2 mg Intravenous Q4H PRN Wyatt Haste, MD   2 mg at 06/01/15 1429  . ondansetron (ZOFRAN) injection 4 mg  4 mg Intravenous Q4H PRN Wyatt Haste, MD      . oxyCODONE (Oxy IR/ROXICODONE) immediate release tablet 5 mg  5 mg Oral Q4H PRN Wyatt Haste, MD   5 mg at 05/31/15 2250  . piperacillin-tazobactam (ZOSYN) IVPB 3.375 g  3.375 g Intravenous Q12H Cindi Carbon, RPH   3.375 g at 06/01/15 1046  . [START ON 06/02/2015] vancomycin (VANCOCIN) IVPB 1000 mg/200 mL premix  1,000 mg Intravenous Q dialysis Cindi Carbon, [facility]        Past Medical History  Diagnosis Date  . Chronic kidney disease   . Hypertension   . Diabetes mellitus without complication [facility])     Past Surgical History  Procedure Laterality Date  . Eye surgery  bil cataracts  . Av fistula placement Left 11/23/2014    Procedure: ARTERIOVENOUS (AV) FISTULA CREATION;  Surgeon: Annice Needy, MD;  Location: [facility] ORS;  Service: Vascular;  Laterality: Left;  . Peripheral vascular  catheterization N/A 02/10/2015    Procedure: Dialysis/Perma Catheter Insertion;  Surgeon: Annice Needy, MD;  Location: [facility] INVASIVE CV LAB;  Service: Cardiovascular;  Laterality: N/A;  . Peripheral vascular catheterization N/A 03/13/2015    Procedure: A/V Shuntogram/Fistulagram;  Surgeon: Annice Needy, MD;  Location: [facility] INVASIVE CV LAB;  Service: Cardiovascular;  Laterality: N/A;  . Peripheral vascular catheterization N/A 03/13/2015    Procedure: A/V Shunt Intervention;  Surgeon: Annice Needy, MD;  Location: [facility] INVASIVE CV LAB;  Service: Cardiovascular;  Laterality: N/A;  . Peripheral vascular catheterization Left 04/27/2015    Procedure: A/V Shuntogram/Fistulagram;  Surgeon: Annice Needy, MD;   Location: [facility] INVASIVE CV LAB;  Service: Cardiovascular;  Laterality: Left;  . Peripheral vascular catheterization N/A 04/27/2015    Procedure: A/V Shunt Intervention;  Surgeon: Annice Needy, MD;  Location: [facility] INVASIVE CV LAB;  Service: Cardiovascular;  Laterality: N/A;  . Peripheral vascular catheterization N/A 05/11/2015    Procedure: Dialysis/Perma Catheter Removal;  Surgeon: Annice Needy, MD;  Location: [facility] INVASIVE CV LAB;  Service: Cardiovascular;  Laterality: N/A;    Social History Social History  Substance Use Topics  . Smoking status: Never Smoker   . Smokeless tobacco: Never Used  . Alcohol Use: No    Family History Family History  Problem Relation Age of Onset  . COPD Mother   . Cancer Father    no history of porphyria or autoimmune disease  Allergies  Allergen Reactions  . Codeine Nausea Only    hydrocodone  . Vicodin [Hydrocodone-Acetaminophen] Nausea And Vomiting     REVIEW OF SYSTEMS (Negative unless checked)  Constitutional: [] Weight loss  [] Fever  [] Chills Cardiac: [] Chest pain   [] Chest pressure   [] Palpitations   [] Shortness of breath when laying flat   [] Shortness of breath at rest   [] Shortness of breath with exertion. Vascular:  [] Pain in legs with walking   [x] Pain in legs at rest   [] Pain in legs when laying flat   [] Claudication   [] Pain in feet when walking  [] Pain in feet at rest  [] Pain in feet when laying flat   [] History of DVT   [] Phlebitis   [x] Swelling in legs   [] Varicose veins   [x] Non-healing ulcers Pulmonary:   [] Uses home oxygen   [] Productive cough   [] Hemoptysis   [] Wheeze  [] COPD   [] Asthma Neurologic:  [] Dizziness  [] Blackouts   [] Seizures   [] History of stroke   [] History of TIA  [] Aphasia   [] Temporary blindness   [] Dysphagia   [] Weakness or numbness in arms   [] Weakness or numbness in legs Musculoskeletal:  [] Arthritis   [] Joint swelling   [] Joint pain   [] Low back pain Hematologic:  [] Easy bruising  [] Easy bleeding    [] Hypercoagulable state   [] Anemic  [] Hepatitis Gastrointestinal:  [] Blood in stool   [] Vomiting blood  [] Gastroesophageal reflux/heartburn   [] Difficulty swallowing. Genitourinary:  [x] Chronic kidney disease   [] Difficult urination  [] Frequent urination  [] Burning with urination   [] Blood in urine Skin:  [] Rashes   [x] Ulcers   [x] Wounds Psychological:  [] History of anxiety   []  History of major depression.  Physical Examination  Filed Vitals:   05/31/15 2240 06/01/15 0626 06/01/15 1045 06/01/15 1251  BP: 105/55 125/66 128/74 103/49  Pulse: 91 80 86 77  Temp: 97.9 F (36.6 C) 98 F (36.7 C)  98.4 F (36.9 C)  TempSrc: Oral Oral  Oral  Resp: 17 17  14   Height:  Weight:      SpO2: 100% 97%  97%   Body mass index is 42.73 kg/(m^2). Gen:  WD/WN, NAD morbidly obese Head: [place]/AT, No temporalis wasting. Prominent temp pulse not noted. Ear/Nose/Throat: Hearing grossly intact, nares w/o erythema or drainage, oropharynx w/o Erythema/Exudate Eyes: PERRLA, EOMI.  Neck: Supple, no nuchal rigidity.  No bruit or JVD.  Pulmonary:  Good air movement, clear to auscultation bilaterally.  Cardiac: RRR, normal S1, S2, no Murmurs, rubs or gallops. Vascular: There is 4+ lymphedema bilaterally, essentially there is massive edema with woody changes to the skin there are multiple ulcers bilaterally although they appeared deep I do not believe they go beyond the subcutaneous tissue layer do not believe muscle is exposed I believe there appearances secondary to the massive edema creating swelling that the tissue at depth to the wounds. There is exit a present but minimal necrotic tissue there is no odor the drainage is clear lymphatic. Vessel Right Left  Radial Palpable Palpable  Ulnar Palpable Palpable  Brachial Palpable Palpable  Carotid Palpable, without bruit Palpable, without bruit  Aorta Not palpable N/A  Femoral Palpable Palpable  Popliteal Palpable Palpable  PT Palpable Palpable  DP Palpable  Palpable   Gastrointestinal: soft, non-tender/non-distended. No guarding/reflex. No masses, surgical incisions, or scars. Musculoskeletal: M/S 5/5 throughout.  Extremities without ischemic changes.  No deformity or atrophy. No edema. Neurologic: CN 2-12 intact. Pain and light touch intact in extremities.  Symmetrical.  Speech is fluent. Motor exam as listed above. Psychiatric: Judgment intact, Mood & affect appropriate for pt's clinical situation. Dermatologic: No rashes or ulcers noted.  No cellulitis or open wounds. Lymph : No Cervical, Axillary, or Inguinal lymphadenopathy.   CBC Lab Results  Component Value Date   WBC 10.8 06/01/2015   HGB 10.1* 06/01/2015   HCT 30.9* 06/01/2015   MCV 88.5 06/01/2015   PLT 416 06/01/2015    BMET    Component Value Date/Time   NA 138 06/01/2015 0355   NA 141 11/04/2011 0439   K 3.3* 06/01/2015 0355   K 3.5 11/04/2011 0439   CL 103 06/01/2015 0355   CL 103 11/04/2011 0439   CO2 24 06/01/2015 0355   CO2 27 11/04/2011 0439   GLUCOSE 162* 06/01/2015 0355   GLUCOSE 117* 11/04/2011 0439   BUN 27* 06/01/2015 0355   BUN 33* 11/04/2011 0439   CREATININE 3.75* 06/01/2015 0355   CREATININE 5.32* 11/04/2011 0439   CALCIUM 8.5* 06/01/2015 0355   CALCIUM 7.7* 11/04/2011 0439   GFRNONAA 12* 06/01/2015 0355   GFRNONAA 8* 11/04/2011 0439   GFRAA 14* 06/01/2015 0355   GFRAA 10* 11/04/2011 0439   Estimated Creatinine Clearance: 21.1 mL/min (by C-G formula based on Cr of 3.75).  COAG Lab Results  Component Value Date   INR 1.08 11/21/2014    Radiology No results found.  Assessment/Plan *Venous stasis ulcerations bilateral lower extremities Is a very complicated problem which is made profoundly worse by her comorbidities. Firstly, I do not believe she needs operative debridement. I believe that the #1 Paramount factor which must be controlled is her edema. I will investigate what highly absorptive alginate-type material we have and if available  then would apply that to the ulcers and then wrap her legs and Unna boots. Likely these boots will need to be changed after only 3 days. Secondly she must stop sleeping in a chair as the continuous 24 hour day dependency is adding tremendously to her edema. Based on my discussion with the  patient simply asking her to sleep in bed will not be feasible secondary to the pain and therefore investigation to a specialty bed which would provide offloading of the calf areas will be needed. This may also be needed at home as well and so home health may need to be involved. I am hopeful that a KinAir bed may be sufficient. Thirdly she has a profoundly low albumin and this needs to be remedied whether that be with albumin transfusion at the end of dialysis or nutritional supplementation or a combination of both. Fourthly just by examination and observing her body habitus I would find it hard to believe that she does not have significant sleep apnea which again may be adding significantly to her leg edema. I'm not sure that sleep study can be achieved while in house however it may be worthwhile to have respiratory therapy do a preliminary evaluation.  A total of 75 minutes was spent in evaluation, discussion with the patient care coordination.  * Cellulitis bilateral legs  IV vancomycin and Zosyn for now.  * ESRD on HD  Nephrology consult to help do HD here.  * DM  Cont lantus, and Insulin sliding scale coverage.  * Htn  Cont coreg. Stable.   Latreshia Beauchaine, Latina CraverGregory G, MD  06/01/2015 8:06 PM

## 2015-06-01 NOTE — Progress Notes (Signed)
[facility] [place] at [facility]   PATIENT NAME: [name]    MR#:  [id number]  DATE OF BIRTH:  07-12-1956  SUBJECTIVE: 59 year old female patient with diabetes, hypertension, ESRD on hemodialysis comes in because of infected leg ulcers. Fever but noted to have drainage from leg ulcers.   CHIEF COMPLAINT:   Chief Complaint  Patient presents with  . Wound Check    REVIEW OF SYSTEMS:    Review of Systems  Constitutional: Negative for fever and chills.  HENT: Negative for hearing loss.   Eyes: Negative for blurred vision, double vision and photophobia.  Respiratory: Negative for cough, hemoptysis and shortness of breath.   Cardiovascular: Negative for palpitations, orthopnea and leg swelling.  Gastrointestinal: Negative for heartburn, vomiting, abdominal pain and diarrhea.  Genitourinary: Negative for dysuria and urgency.  Musculoskeletal: Negative for myalgias and neck pain.       Bilateral leg ulcers.  Skin: Negative for rash.  Neurological: Negative for dizziness, focal weakness, seizures, weakness and headaches.  Psychiatric/Behavioral: Negative for memory loss. The patient does not have insomnia.     Nutrition:  Tolerating Diet: Tolerating PT:      DRUG ALLERGIES:   Allergies  Allergen Reactions  . Codeine Nausea Only    hydrocodone  . Vicodin [Hydrocodone-Acetaminophen] Nausea And Vomiting    VITALS:  Blood pressure 103/49, pulse 77, temperature 98.4 F (36.9 C), temperature source Oral, resp. rate 14, height  (1.676 m), weight 120.022 kg (264 lb 9.6 oz), SpO2 97 %.  PHYSICAL EXAMINATION:   Physical Exam  GENERAL:  59 y.o.-year-old patient lying in the bed with no acute distress.  EYES: Pupils equal, round, reactive to light and accommodation. No scleral icterus. Extraocular muscles intact.  HEENT: Head atraumatic, normocephalic. Oropharynx and nasopharynx clear.  NECK:  Supple, no jugular venous distention. No  thyroid enlargement, no tenderness.  LUNGS: Normal breath sounds bilaterally, no wheezing, rales,rhonchi or crepitation. No use of accessory muscles of respiration.  CARDIOVASCULAR: S1, S2 normal. No murmurs, rubs, or gallops.  ABDOMEN: Soft, nontender, nondistended. Bowel sounds present. No organomegaly or mass.  EXTREMITIES: Bilateral leg ulcers presently wrapped. Changes the dressings. Patient has multiple ulcers in both legs with yellow slough covering, left leg has deep ulcer. NEUROLOGIC: Cranial nerves II through XII are intact. Muscle strength 5/5 in all extremities. Sensation intact. Gait not checked.  PSYCHIATRIC: The patient is alert and oriented x 3.  SKIN: No obvious rash, lesion, or ulcer.    LABORATORY PANEL:   CBC  Recent Labs Lab 06/01/15 0355  WBC 10.8  HGB 10.1*  HCT 30.9*  PLT 416   ------------------------------------------------------------------------------------------------------------------  Chemistries   Recent Labs Lab 05/31/15 0959 06/01/15 0355  NA 138 138  K 3.4* 3.3*  CL 104 103  CO2 26 24  GLUCOSE 151* 162*  BUN 22* 27*  CREATININE 3.47* 3.75*  CALCIUM 8.4* 8.5*  AST 14*  --   ALT 11*  --   ALKPHOS 123  --   BILITOT 0.5  --    ------------------------------------------------------------------------------------------------------------------  Cardiac Enzymes No results for input(s): TROPONINI in the last 168 hours. ------------------------------------------------------------------------------------------------------------------  RADIOLOGY:  No results found.   ASSESSMENT AND PLAN:   Principal Problem:   Cellulitis Active Problems:   Diabetic leg ulcer (HCC)   #1. Cellulitis of the bilateral legs due to diabetic ulcers: Continue vancomycin, Zosyn, consult wound care, vascular surgery. Patient may need debridement. #2 ESRD and hemodialysis nephrology is consulted. Goes for dialysis Monday,  Wednesday Friday. #3 diabetes mellitus  type chair and he she is on Lantus, insulin  #4 hypertension; continue Coreg, furosemide, losartan.   All the records are reviewed and case discussed with Care Management/Social Workerr. Management plans discussed with the patient, family and they are in agreement.  CODE STATUS: Full  TOTAL TIME TAKING CARE OF THIS PATIENT: 35  minutes.   POSSIBLE D/C IN 2/3 2-3 DAYS, DEPENDING ON CLINICAL CONDITION.   Katha HammingKONIDENA,Stephana Morell M.D on 06/01/2015 at 4:27 PM  Between 7am to 6pm - Pager - [phone]  After 6pm go to [email or web address] - password [email or web address] [email or web address]  [facility]  Office  [phone]  CC: Primary care physician; Fidel LevyJames Hawkins Jr, MD

## 2015-06-01 NOTE — Care Management Note (Signed)
Patient is active at outpatient dialysis on a MWF 2nd shift schedule.  I will send additional medical records at discharge.  Ivor ReiningKim Sharnell Knight   Dialysis Liaison   [phone]

## 2015-06-02 LAB — GLUCOSE, CAPILLARY
GLUCOSE-CAPILLARY: 122 mg/dL — AB (ref 65–99)
GLUCOSE-CAPILLARY: 104 mg/dL — AB (ref 65–99)
GLUCOSE-CAPILLARY: 149 mg/dL — AB (ref 65–99)
Glucose-Capillary: 148 mg/dL — ABNORMAL HIGH (ref 65–99)
Glucose-Capillary: 179 mg/dL — ABNORMAL HIGH (ref 65–99)

## 2015-06-02 LAB — RENAL FUNCTION PANEL
ANION GAP: 8 (ref 5–15)
Albumin: 2.5 g/dL — ABNORMAL LOW (ref 3.5–5.0)
BUN: 17 mg/dL (ref 6–20)
CALCIUM: 8.2 mg/dL — AB (ref 8.9–10.3)
CO2: 29 mmol/L (ref 22–32)
Chloride: 100 mmol/L — ABNORMAL LOW (ref 101–111)
Creatinine, Ser: 3 mg/dL — ABNORMAL HIGH (ref 0.44–1.00)
GFR calc Af Amer: 18 mL/min — ABNORMAL LOW (ref >60)
GFR calc non Af Amer: 16 mL/min — ABNORMAL LOW (ref >60)
GLUCOSE: 164 mg/dL — AB (ref 65–99)
Phosphorus: 3.8 mg/dL (ref 2.5–4.6)
Potassium: 3.4 mmol/L — ABNORMAL LOW (ref 3.5–5.1)
SODIUM: 137 mmol/L (ref 135–145)

## 2015-06-02 LAB — CBC
HCT: 31.2 % — ABNORMAL LOW (ref 35.0–47.0)
HEMOGLOBIN: 10.2 g/dL — AB (ref 12.0–16.0)
MCH: 28.7 pg (ref 26.0–34.0)
MCHC: 32.5 g/dL (ref 32.0–36.0)
MCV: 88.3 fL (ref 80.0–100.0)
Platelets: 357 10*3/uL (ref 150–440)
RBC: 3.54 MIL/uL — ABNORMAL LOW (ref 3.80–5.20)
RDW: 16.9 % — AB (ref 11.5–14.5)
WBC: 9.4 10*3/uL (ref 3.6–11.0)

## 2015-06-02 LAB — PHOSPHORUS: PHOSPHORUS: 3.8 mg/dL (ref 2.5–4.6)

## 2015-06-02 MED ORDER — ALBUMIN HUMAN 25 % IV SOLN
25.0000 g | Freq: Every day | INTRAVENOUS | Status: DC
  Administered 2015-06-02: 25 g via INTRAVENOUS
  Filled 2015-06-02 (×2): qty 100

## 2015-06-02 MED ORDER — SODIUM CHLORIDE 0.9 % IV SOLN: 100.0000 mL | INTRAVENOUS | Status: DC | PRN

## 2015-06-02 MED ORDER — VANCOMYCIN HCL IN DEXTROSE 1-5 GM/200ML-% IV SOLN
1000.0000 mg | Freq: Once | INTRAVENOUS | Status: AC
  Administered 2015-06-03: 1000 mg via INTRAVENOUS
  Filled 2015-06-02: qty 200

## 2015-06-02 MED ORDER — PENTAFLUOROPROP-TETRAFLUOROETH EX AERO: 1.0000 "application " | INHALATION_SPRAY | CUTANEOUS | Status: DC | PRN

## 2015-06-02 MED ORDER — VANCOMYCIN HCL IN DEXTROSE 1-5 GM/200ML-% IV SOLN: 1000.0000 mg | INTRAVENOUS | Status: DC

## 2015-06-02 MED ORDER — INSULIN ASPART 100 UNIT/ML ~~LOC~~ SOLN: 0.0000 [IU] | Freq: Every day | SUBCUTANEOUS | Status: DC

## 2015-06-02 MED ORDER — HEPARIN SODIUM (PORCINE) 1000 UNIT/ML DIALYSIS
1000.0000 [IU] | INTRAMUSCULAR | Status: DC | PRN
  Filled 2015-06-02: qty 1

## 2015-06-02 MED ORDER — LIDOCAINE HCL (PF) 1 % IJ SOLN: 5.0000 mL | INTRAMUSCULAR | Status: DC | PRN

## 2015-06-02 MED ORDER — ALTEPLASE 2 MG IJ SOLR
2.0000 mg | Freq: Once | INTRAMUSCULAR | Status: DC | PRN
  Filled 2015-06-02: qty 2

## 2015-06-02 MED ORDER — LIDOCAINE-PRILOCAINE 2.5-2.5 % EX CREA
1.0000 "application " | TOPICAL_CREAM | CUTANEOUS | Status: DC | PRN
  Filled 2015-06-02: qty 5

## 2015-06-02 NOTE — Progress Notes (Addendum)
ANTIBIOTIC CONSULT NOTE - INITIAL  Pharmacy Consult for vancomycin and piperacillin/tazobactam (day 3) Indication: Cellulitis  Allergies  Allergen Reactions  . Codeine Nausea Only    hydrocodone  . Vicodin [Hydrocodone-Acetaminophen] Nausea And Vomiting    Patient Measurements: Height: 5\' 6"  (167.6 cm) Weight: 264 lb 9.6 oz (120.022 kg) IBW/kg (Calculated) : 59.3 Adjusted Body Weight: 85.5 kg  Vital Signs: Temp: 97.6 F (36.4 C) (11/18 0529) Temp Source: Oral (11/18 0529) BP: 100/62 mmHg (11/18 0529) Pulse Rate: 80 (11/18 0529) Intake/Output from previous day: 11/17 0701 - 11/18 0700 In: 360 [P.O.:360] Out: 2804 [Urine:1104] Intake/Output from this shift:    Labs:  Recent Labs  05/31/15 0959 06/01/15 0355  WBC 11.4* 10.8  HGB 10.6* 10.1*  PLT 464* 416  CREATININE 3.47* 3.75*   Estimated Creatinine Clearance: 21.1 mL/min (by C-G formula based on Cr of 3.75). No results for input(s): VANCOTROUGH, VANCOPEAK, VANCORANDOM, GENTTROUGH, GENTPEAK, GENTRANDOM, TOBRATROUGH, TOBRAPEAK, TOBRARND, AMIKACINPEAK, AMIKACINTROU, AMIKACIN in the last 72 hours.   Microbiology: Recent Results (from the past 720 hour(s))  Wound culture     Status: None (Preliminary result)   Collection Time: 05/31/15  8:42 AM  Result Value Ref Range Status   Specimen Description WOUND  Final   Special Requests NONE  Final   Gram Stain   Final    FEW WBC SEEN FEW GRAM NEGATIVE RODS FEW GRAM POSITIVE COCCI IN PAIRS    Culture HOLDING FOR POSSIBLE PATHOGEN  Final   Report Status PENDING  Incomplete    Medical History: Past Medical History  Diagnosis Date  . Chronic kidney disease   . Hypertension   . Diabetes mellitus without complication (HCC)     Medications:  Anti-infectives    Start     Dose/Rate Route Frequency Ordered Stop   06/02/15 1200  vancomycin (VANCOCIN) IVPB 1000 mg/200 mL premix  Status:  Discontinued     1,000 mg 200 mL/hr over 60 Minutes Intravenous Every Dialysis  05/31/15 1449 06/02/15 0813   06/02/15 1200  vancomycin (VANCOCIN) IVPB 1000 mg/200 mL premix     1,000 mg 200 mL/hr over 60 Minutes Intravenous Every M-W-F (Hemodialysis) 06/02/15 0813     05/31/15 2200  piperacillin-tazobactam (ZOSYN) IVPB 3.375 g     3.375 g 12.5 mL/hr over 240 Minutes Intravenous Every 12 hours 05/31/15 1449     05/31/15 1400  piperacillin-tazobactam (ZOSYN) IVPB 3.375 g     3.375 g 100 mL/hr over 30 Minutes Intravenous  Once 05/31/15 1354 05/31/15 1459   05/31/15 1015  vancomycin (VANCOCIN) 2,000 mg in sodium chloride 0.9 % 500 mL IVPB     2,000 mg 250 mL/hr over 120 Minutes Intravenous  Once 05/31/15 1012 05/31/15 1254     Assessment: Pharmacy consulted to dose vancomycin and piperacillin/tazobactam in this 59 year old female for cellulitis. Patient has PMH significant for DM and ESRD on HD and has a history of lower extremity ulcers and infections for the past 5-6 months. Patient has been followed by wound care outpatient.  Patient is on HD with a normal schedule of MWF.   Adjusted body weight = 85.5 kg  Goal of Therapy:  Vancomycin trough level 10-15 mcg/ml  Plan:  Will continue patient on Zosyn EI 3.375g IV q12hr.   Will continue patient on vancomycin 1g IV to be infused during the last hour of dialysis. Patient received dialysis on 11/17, however did not receive vancomycin. Patient has regularly scheduled dialysis today; will need to ensure vancomycin is  given today. Will obtain vanc level prior to dialysis on 11/21.   Patient had been afebrile since admission and WBC remains < 12, meeting criteria for mild to moderate disease. If appropriate, please consider narrowing antibiotic therapy, ? Cefazolin.    Pharmacy will continue to monitor and adjust per consult.    Sandee Bernath L 06/02/2015,9:49 AM

## 2015-06-02 NOTE — Progress Notes (Signed)
Bilateral lower leg dressing change completed.

## 2015-06-02 NOTE — Care Management (Signed)
Spoke with Dr Clent RidgesWalsh concerning Gell overlay matress and hospital bed she is OK with this as stated would put order in for.  Dr Gilda CreaseSchnier recommended a Geophysical data processorKinAir matress(low air loss). Spoke with Will Dareen PianoAnderson DME provider who stated that patient would not qualify for this unless she has stasis ulcers stage 3 or greater. Informed Dr Gilda CreaseSchnier. Will have Gel overlay sent to room for patient to use until discharge. Order for home DME placed. Patient offered choice of Home Health agencies and Chose to go with [facility]. referal placed.

## 2015-06-02 NOTE — Care Management (Signed)
Spoke with patient who is alert oriented from home ans stated that she ambulates and drives self. Patient was receiving ome health with [facility] and does not want to continue with this agency. Also spoke with Will Dareen PianoAnderson at [facility] for information on getting hospital bed and pressure relieving mattress (gel overlay) . Will ask attending for Rx. Will need HH for wound care patient unable to reach wound area.

## 2015-06-02 NOTE — Care Management (Signed)
Patient needs semi electric hospital bed. Patient requires frequent changes in body position that a normal bed will not allow.

## 2015-06-02 NOTE — Progress Notes (Signed)
[facility]  ROUNDING NOTE   Subjective:  Patient had dialysis yesterday. We have decided to perform daily dialysis. Patient was also evaluated by vascular surgery.   Objective:  Vital signs in last 24 hours:  Temp:  [97.6 F (36.4 C)-98.4 F (36.9 C)] 97.6 F (36.4 C) (11/18 0529) Pulse Rate:  [77-90] 80 (11/18 0529) Resp:  [12-22] 16 (11/18 0529) BP: (100-119)/(49-94) 100/62 mmHg (11/18 0529) SpO2:  [97 %-99 %] 98 % (11/18 0529)  Weight change:  Filed Weights   05/31/15 0934 05/31/15 1635  Weight: 117.935 kg (260 lb) 120.022 kg (264 lb 9.6 oz)    Intake/Output: I/O last 3 completed shifts: In: 403 [P.O.:360; IV Piggyback:43] Out: 3104 [Urine:1404; Other:1700]   Intake/Output this shift:  Total I/O In: 240 [P.O.:240] Out: -   Physical Exam: General: NAD, sitting up in chair.  Head: Normocephalic, atraumatic. Moist oral mucosal membranes  Eyes: Anicteric  Neck: Supple, trachea midline  Lungs:  Clear to auscultation normal effort  Heart: Regular rate and rhythm  Abdomen:  Soft, nontender, BS present  Extremities:  3+ peripheral edema, serous drainage noted into dressings  Neurologic: Nonfocal, moving all four extremities  Skin: Wounds on b/l LE's wrapped  Access: LUE AVF    Basic Metabolic Panel:  Recent Labs Lab 05/31/15 0959 06/01/15 0355  NA 138 138  K 3.4* 3.3*  CL 104 103  CO2 26 24  GLUCOSE 151* 162*  BUN 22* 27*  CREATININE 3.47* 3.75*  CALCIUM 8.4* 8.5*    Liver Function Tests:  Recent Labs Lab 05/31/15 0959  AST 14*  ALT 11*  ALKPHOS 123  BILITOT 0.5  PROT 7.1  ALBUMIN 2.8*   No results for input(s): LIPASE, AMYLASE in the last 168 hours. No results for input(s): AMMONIA in the last 168 hours.  CBC:  Recent Labs Lab 05/31/15 0959 06/01/15 0355  WBC 11.4* 10.8  NEUTROABS 8.0*  --   HGB 10.6* 10.1*  HCT 33.6* 30.9*  MCV 88.1 88.5  PLT 464* 416    Cardiac Enzymes: No results for input(s): CKTOTAL, CKMB,  CKMBINDEX, TROPONINI in the last 168 hours.  BNP: Invalid input(s): POCBNP  CBG:  Recent Labs Lab 06/01/15 1143 06/01/15 1639 06/02/15 0110 06/02/15 0749 06/02/15 1143  GLUCAP 231* 177* 148* 122* 104*    Microbiology: Results for orders placed or performed during the hospital encounter of 05/31/15  Wound culture     Status: None (Preliminary result)   Collection Time: 05/31/15  8:42 AM  Result Value Ref Range Status   Specimen Description WOUND  Final   Special Requests NONE  Final   Gram Stain   Final    FEW WBC SEEN FEW GRAM NEGATIVE RODS FEW GRAM POSITIVE COCCI IN PAIRS    Culture   Final    HEAVY GROWTH GRAM NEGATIVE RODS IDENTIFICATION AND SUSCEPTIBILITIES TO FOLLOW TRYING TO ISOLATE MULTIPLE OTHER POTENTIAL PATHOGENS    Report Status PENDING  Incomplete    Coagulation Studies: No results for input(s): LABPROT, INR in the last 72 hours.  Urinalysis: No results for input(s): COLORURINE, LABSPEC, PHURINE, GLUCOSEU, HGBUR, BILIRUBINUR, KETONESUR, PROTEINUR, UROBILINOGEN, NITRITE, LEUKOCYTESUR in the last 72 hours.  Invalid input(s): APPERANCEUR    Imaging: No results found.   Medications:     . carvedilol  3.125 mg Oral BID  . [START ON 06/03/2015] epoetin (EPOGEN/PROCRIT) injection  4,000 Units Intravenous Q T,Th,Sa-HD  . furosemide  80 mg Oral Daily  . heparin  5,000 Units Subcutaneous  3 times per day  . insulin aspart  0-9 Units Subcutaneous TID WC  . insulin detemir  36 Units Subcutaneous QHS  . loratadine  10 mg Oral Daily  . losartan  50 mg Oral Daily  . piperacillin-tazobactam (ZOSYN)  IV  3.375 g Intravenous Q12H  . vancomycin  1,000 mg Intravenous Q M,W,F-HD   acetaminophen, morphine injection, ondansetron (ZOFRAN) IV, oxyCODONE  Assessment/ Plan:  59 y.o. female 59 year old Caucasian female with past medical history of hypertension, diabetes mellitus, chronic right lower extremity wound, history of decubitus ulcer, morbid obesity, ESRD on  HD MWF, anemia of CKD, SHPTH  1. End-stage renal disease on hemodialysis Monday, Wednesday, Friday. Patient continues to have considerable lower extremity edema.  We will continue to perform dialysis on a daily basis.we will provide albumin to be used during analysis for blood pressure support.  2. Anemia of chronic kidney disease. Continue Epogen 4000 units IV with dialysis.  3. Secondary hyperparathyroidism. Awaiting PTH/phos today.  4. Hypertension. Continue losartan/metoprolol.  .  5. Bilateral lower extremity wounds. Albumin low at 2.8 and contributing to edema.  Will add nepro to help increase her albumin.  Will give albumin with HD as well.   LOS: 2 Lorne Winkels 11/18/201612:21 PM

## 2015-06-02 NOTE — Progress Notes (Signed)
Patient ID: [name], female   DOB: 1956/03/02, 59 y.o.   MRN: [id number] [facility] PROGRESS NOTE  PCP: Fidel Levy, MD  HPI/Subjective: Patient wants to go home. Feels okay and offers no complaints. She states that this is a chronic problem for her.  Objective: Filed Vitals:   06/02/15 1445  BP: 107/77  Pulse: 89  Temp: 97.3 F (36.3 C)  Resp: 21    Filed Weights   05/31/15 0934 05/31/15 1635  Weight: 117.935 kg (260 lb) 120.022 kg (264 lb 9.6 oz)    ROS: Review of Systems  Constitutional: Negative for fever and chills.  Eyes: Negative for blurred vision.  Respiratory: Negative for cough and shortness of breath.   Cardiovascular: Negative for chest pain.  Gastrointestinal: Negative for nausea, vomiting, abdominal pain, diarrhea and constipation.  Genitourinary: Negative for dysuria.  Musculoskeletal: Negative for joint pain.  Neurological: Negative for dizziness and headaches.   Exam: Physical Exam  Constitutional: She is oriented to person, place, and time.  HENT:  Nose: No mucosal edema.  Mouth/Throat: No oropharyngeal exudate or posterior oropharyngeal edema.  Eyes: Conjunctivae, EOM and lids are normal. Pupils are equal, round, and reactive to light.  Neck: No JVD present. Carotid bruit is not present. No edema present. No thyroid mass and no thyromegaly present.  Cardiovascular: S1 normal and S2 normal.  Exam reveals no gallop.   No murmur heard. Pulses:      Dorsalis pedis pulses are 2+ on the right side, and 2+ on the left side.  Respiratory: No respiratory distress. She has no wheezes. She has no rhonchi. She has no rales.  GI: Soft. Bowel sounds are normal. There is no tenderness.  Musculoskeletal:       Right ankle: She exhibits swelling.       Left ankle: She exhibits swelling.  Lymphadenopathy:    She has no cervical adenopathy.  Neurological: She is alert and oriented to person, place, and time. No cranial nerve deficit.   Skin: Skin is warm. Nails show no clubbing.  Bilateral lower extremities covered with pressure wraps  Psychiatric: She has a normal mood and affect.    Data Reviewed: Basic Metabolic Panel:  Recent Labs Lab 05/31/15 0959 06/01/15 0355 06/02/15 1356  NA 138 138  --   K 3.4* 3.3*  --   CL 104 103  --   CO2 26 24  --   GLUCOSE 151* 162*  --   BUN 22* 27*  --   CREATININE 3.47* 3.75*  --   CALCIUM 8.4* 8.5*  --   PHOS  --   --  3.8   Liver Function Tests:  Recent Labs Lab 05/31/15 0959  AST 14*  ALT 11*  ALKPHOS 123  BILITOT 0.5  PROT 7.1  ALBUMIN 2.8*   CBC:  Recent Labs Lab 05/31/15 0959 06/01/15 0355 06/02/15 1356  WBC 11.4* 10.8 9.4  NEUTROABS 8.0*  --   --   HGB 10.6* 10.1* 10.2*  HCT 33.6* 30.9* 31.2*  MCV 88.1 88.5 88.3  PLT 464* 416 357    CBG:  Recent Labs Lab 06/01/15 1639 06/02/15 0110 06/02/15 0749 06/02/15 1143 06/02/15 1603  GLUCAP 177* 148* 122* 104* 149*    Recent Results (from the past 240 hour(s))  Wound culture     Status: None (Preliminary result)   Collection Time: 05/31/15  8:42 AM  Result Value Ref Range Status   Specimen Description WOUND  Final   Special  Requests NONE  Final   Gram Stain   Final    FEW WBC SEEN FEW GRAM NEGATIVE RODS FEW GRAM POSITIVE COCCI IN PAIRS    Culture   Final    HEAVY GROWTH GRAM NEGATIVE RODS IDENTIFICATION AND SUSCEPTIBILITIES TO FOLLOW TRYING TO ISOLATE MULTIPLE OTHER POTENTIAL PATHOGENS    Report Status PENDING  Incomplete      Scheduled Meds: . albumin human  25 g Intravenous Daily  . carvedilol  3.125 mg Oral BID  . [START ON 06/03/2015] epoetin (EPOGEN/PROCRIT) injection  4,000 Units Intravenous Q T,Th,Sa-HD  . furosemide  80 mg Oral Daily  . heparin  5,000 Units Subcutaneous 3 times per day  . insulin aspart  0-5 Units Subcutaneous QHS  . insulin aspart  0-9 Units Subcutaneous TID WC  . insulin detemir  36 Units Subcutaneous QHS  . loratadine  10 mg Oral Daily  .  piperacillin-tazobactam (ZOSYN)  IV  3.375 g Intravenous Q12H  . vancomycin  1,000 mg Intravenous Q M,W,F-HD    Assessment/Plan:  1. Cellulitis of bilateral lower extremities and diabetic ulcers. Patient is on vancomycin and Zosyn. Wound boots will be placed and continued as outpatient. Vascular surgery stated no debridement needed. Wound culture growing out gram-negative rods and gram-positive cocci in pairs. Patient would like to go home tomorrow morning. 2. Low albumin- patient receiving albumin infusions 3. End-stage renal disease- status post dialysis today 4. Anasarca- dialysis and Lasix to remove fluid 5. Essential hypertension- blood pressure on the lower side with Coreg and Lasix 6. Type 2 diabetes mellitus- patient is on detemir insulin and sliding scale  Code Status:     Code Status Orders        Start     Ordered   05/31/15 1613  Full code   Continuous     05/31/15 1612    Advance Directive Documentation        Most Recent Value   Type of Advance Directive  Healthcare Power of Attorney, Living will   Pre-existing out of facility DNR order (yellow form or pink MOST form)     "MOST" Form in Place?       Family Communication: Son at bedside  Disposition Plan: Patient would like to go home with home health tomorrow  Consultants:  Nephrology  Vascular surgery  Antibiotics:  Vancomycin  Zosyn  Time spent: 25 minutes  Alford HighlandWIETING, Janeshia Ciliberto  [facility]

## 2015-06-02 NOTE — Consult Note (Signed)
WOC wound consult note Reason for Consult: Bilateral lower leg ulcerations with compression.  History of venous insufficiency.  Has been assessed by vascular service and they have ordered Unnas boots.  No zinc layer is available in supply chain today, so a dry [employer] boot will be used.  Absorbent wound care dressings, calcium alginate will be used.  Wound type:Chronic venous insufficiency with ulcerations.  Stage 2 pressure injury to sacrum Pressure Ulcer POA: Yes Measurement: LEft lateral leg 4 cm x 3.2 cm x 0.4 cm with scattered nonintact lesions to left leg.  Right posterior lower leg 8.2 cm x 6 cm x 1.3 cm with 4 cm x 4 cm x 0.5 cm to right dorsal leg. Wound bed:100% ruddy red with scattered yellow patches.  Drainage (amount, consistency, odor) Heavy serous weeping drainage.  Edema from knee to feet. No odor.  Sacrum scant serous Periwound:Erythema and pain.  Dressing procedure/placement/frequency:Cleanse bilateral legs with soap and water and pat gently dry.  Apply calcium alginate to wound beds.  Cover wound to right posterior leg with ABD pad.  Wrap both legs with dry kerlix wrap, secured with Coban wrap. ([employer] boot).  Will switch over to Unnas boots (zinc layer) once it is available.  Change Mon/Wed/Fri. WOC team will follow.  Maple HudsonKaren Ramsie Ostrander RN BSN CWON Pager [phone]

## 2015-06-03 LAB — PHOSPHORUS: Phosphorus: 4.9 mg/dL — ABNORMAL HIGH (ref 2.5–4.6)

## 2015-06-03 LAB — PARATHYROID HORMONE, INTACT (NO CA): PTH: 218 pg/mL — ABNORMAL HIGH (ref 15–65)

## 2015-06-03 LAB — GLUCOSE, CAPILLARY
Glucose-Capillary: 99 mg/dL (ref 65–99)
Glucose-Capillary: 176 mg/dL — ABNORMAL HIGH (ref 65–99)

## 2015-06-03 LAB — HEPATITIS B SURFACE ANTIBODY, QUANTITATIVE: Hepatitis B-Post: <3.1 m[IU]/mL — ABNORMAL LOW (ref >9.9)

## 2015-06-03 LAB — HEPATITIS B SURFACE ANTIGEN: Hepatitis B Surface Ag: NEGATIVE

## 2015-06-03 MED ORDER — LEVOFLOXACIN 500 MG PO TABS: 500.0000 mg | ORAL_TABLET | ORAL | Status: DC

## 2015-06-03 MED ORDER — LEVOFLOXACIN 750 MG PO TABS
750.0000 mg | ORAL_TABLET | Freq: Once | ORAL | Status: AC
  Administered 2015-06-03: 750 mg via ORAL
  Filled 2015-06-03: qty 1

## 2015-06-03 MED ORDER — OXYCODONE HCL 5 MG PO TABS: 5.0000 mg | ORAL_TABLET | Freq: Four times a day (QID) | ORAL | Status: DC | PRN

## 2015-06-03 NOTE — Progress Notes (Signed)
[name], [name] ([id number]) Visit Report for 05/31/2015 Arrival Information Details Patient Name: [name], [name] Date of Service: 05/31/2015 8:15 AM Medical Record Patient Account Number: [id number] Number: Treating RN: Huel CoventryWoody, Kim 04/25/1956 (59 y.o. Other Clinician: Date of Birth/Sex: Female) Treating BURNS III, Primary Care Physician: Fidel LevyHAWKINS JR, JAMES Physician/Extender: Zollie BeckersWALTER Referring Physician: Jones BroomHAWKINS JR, JAMES Weeks in Treatment: 17 Visit Information History Since Last Visit Added or deleted any medications: No Patient Arrived: Ambulatory Any new allergies or adverse reactions: No Arrival Time: 08:15 Had a fall or experienced change in No Accompanied By: self activities of daily living that may affect Transfer Assistance: None risk of falls: Patient Identification Verified: Yes Signs or symptoms of abuse/neglect since last No Secondary Verification Process Yes visito Completed: Has Dressing in Place as Prescribed: Yes Patient Has Alerts: Yes Has Compression in Place as Prescribed: Yes Patient Alerts: DMII Pain Present Now: No ABI [place] Bilateral Electronic Signature(s) Signed: 06/01/2015 5:49:44 PM By: Elliot GurneyWoody, RN, BSN, Kim RN, BSN Entered By: Elliot GurneyWoody, RN, BSN, Kim on 05/31/2015 08:15:23 Hines, Aaron EdelmanPAMELA H. ([id number]) -------------------------------------------------------------------------------- Encounter Discharge Information Details Patient Name: Rosales, [name] Date of Service: 05/31/2015 8:15 AM Medical Record Patient Account Number: [id number] Number: Treating RN: Huel CoventryWoody, Kim 04/25/1956 (59 y.o. Other Clinician: Date of Birth/Sex: Female) Treating BURNS III, Primary Care Physician: Fidel LevyHAWKINS JR, JAMES Physician/Extender: Zollie BeckersWALTER Referring Physician: Jones BroomHAWKINS JR, JAMES Weeks in Treatment: 3417 Encounter Discharge Information Items Discharge Pain Level: 0 Discharge Condition: Stable Ambulatory Status:  Ambulatory Emergency Discharge Destination: Room Transportation: Private Auto Accompanied By: self Schedule Follow-up Appointment: Yes Medication Reconciliation completed Yes and provided to Patient/Care Dylin Ihnen: Clinical Summary of Care: Electronic Signature(s) Signed: 06/01/2015 5:49:44 PM By: Elliot GurneyWoody, RN, BSN, Kim RN, BSN Entered By: Elliot GurneyWoody, RN, BSN, Kim on 05/31/2015 09:11:41 Garay, Aaron EdelmanPAMELA H. ([id number]) -------------------------------------------------------------------------------- General Visit Notes Details Patient Name: Giammarco, [name] Date of Service: 05/31/2015 8:15 AM Medical Record Patient Account Number: [id number] Number: Treating RN: Huel CoventryWoody, Kim 04/25/1956 (59 y.o. Other Clinician: Date of Birth/Sex: Female) Treating BURNS III, Primary Care Physician: Fidel LevyHAWKINS JR, JAMES Physician/Extender: Zollie BeckersWALTER Referring Physician: Jones BroomHAWKINS JR, JAMES Weeks in Treatment: 17 Notes Patient came in today with legs wrapped in kerlix and coban from the ankle to mid calf. Patient states HHRN has been putting santyl on the wounds with gauze, secured with kerlix and coban. Patient states HHRN is using santyl on wound and has recently sent her to the pharmacy to refill the prescription. (Last Friday) Electronic Signature(s) Signed: 06/01/2015 5:49:44 PM By: Elliot GurneyWoody, RN, BSN, Kim RN, BSN Entered By: Elliot GurneyWoody, RN, BSN, Kim on 05/31/2015 08:31:57 Navarro, Aaron EdelmanPAMELA H. ([id number]) -------------------------------------------------------------------------------- Lower Extremity Assessment Details Patient Name: Dull, [name] Date of Service: 05/31/2015 8:15 AM Medical Record Patient Account Number: [id number] Number: Treating RN: Huel CoventryWoody, Kim 04/25/1956 (59 y.o. Other Clinician: Date of Birth/Sex: Female) Treating BURNS III, Primary Care Physician: Fidel LevyHAWKINS JR, JAMES Physician/Extender: Zollie BeckersWALTER Referring Physician: Jones BroomHAWKINS JR, JAMES Weeks in Treatment: 17 Edema  Assessment Assessed: [Left: No] [Right: No] E[Left: dema] [Right: :] Calf Left: Right: Point of Measurement: 34 cm From Medial Instep 50 cm 47.5 cm Ankle Left: Right: Point of Measurement: 10 cm From Medial Instep 25 cm 24.5 cm Vascular Assessment Pulses: Posterior Tibial Dorsalis Pedis Palpable: [Left:Yes] [Right:Yes] Extremity colors, hair growth, and conditions: Extremity Color: [Left:Mottled] [Right:Mottled] Hair Growth on Extremity: [Left:No] [Right:No] Temperature of Extremity: [Left:Warm] [Right:Warm] Capillary Refill: [Left:< 3 seconds] [Right:< 3 seconds] Toe Nail Assessment Left: Right: Thick: No No Discolored: No No Deformed: No  No Improper Length and Hygiene: No No Electronic Signature(s) Signed: 06/01/2015 5:49:44 PM By: Elliot Gurney, RN, BSN, Kim RN, BSN Entered By: Elliot Gurney, RN, BSN, Kim on 05/31/2015 08:27:14 Luth, [name] ([id number]) Haselton, [name] ([id number]) -------------------------------------------------------------------------------- Multi Wound Chart Details Patient Name: Delauder, [name] Date of Service: 05/31/2015 8:15 AM Medical Record Patient Account Number: [id number] Number: Treating RN: Huel Coventry 1955-10-08 (59 y.o. 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:Calciphylaxis] Date Acquired: [1:01/09/2015]  [2:01/09/2015] [3:01/09/2015] Weeks of Treatment: [1:17] [2:17] [3:17] Wound Status: [1:Open] [2:Open] [3:Open] Measurements L x W x D 8x8x0.6 [2:12x5x0.3] [3:23x13x1.5] (cm) Area (cm) : [1:50.265] [2:47.124] [3:234.834] Volume (cm) : [1:30.159] [2:14.137] [3:352.251] % Reduction in Area: [1:-106846.80%] [2:-50031.90%] [3:-2035.60%] % Reduction in Volume: -603080.00% [2:-156977.80%] [3:-31922.80%] Classification: [1:Full Thickness Without Exposed Support Structures] [2:Full Thickness Without Exposed Support Structures] [3:Full Thickness Without Exposed Support Structures] Periwound Skin Texture: No Abnormalities Noted No Abnormalities Noted [3:No Abnormalities Noted] Periwound Skin [1:No Abnormalities Noted No Abnormalities Noted] [3:No Abnormalities Noted] Moisture: Periwound Skin Color: No Abnormalities Noted No Abnormalities Noted [3:No Abnormalities Noted] Tenderness on [1:No] [2:No] [3:No] Wound Number: 8 9 N/A Photos: No Photos No Photos N/A Wound Location: Right, Posterior Lower Left, Posterior Lower Leg N/A Leg Wounding Event: Gradually Appeared Gradually Appeared N/A Seder, Kaleesi H. ([id number]) Primary Etiology: Calciphylaxis Calciphylaxis N/A Date Acquired: 03/27/2015 03/13/2015 N/A Weeks of Treatment: 8 5 N/A Wound Status: Open Open N/A Measurements L x W x D 10x10x0.3 4x2.8x0.3 N/A (cm) Area (cm) : 78.54 8.796 N/A Volume (cm) : 23.562 2.639 N/A % Reduction in Area: -4344.80% 44.00% N/A % Reduction in Volume: -13211.90% 16.00% N/A Classification: Full Thickness Without Full Thickness Without N/A Exposed Support Exposed Support Structures Structures Periwound Skin Texture: No Abnormalities Noted No Abnormalities Noted N/A Periwound Skin No Abnormalities Noted No Abnormalities Noted N/A Moisture: Periwound Skin Color: No Abnormalities Noted No Abnormalities Noted N/A Tenderness on No No N/A Palpation: Treatment Notes Electronic Signature(s) Signed:  06/01/2015 5:49:44 PM By: Elliot Gurney, RN, BSN, Kim RN, BSN Entered By: Elliot Gurney, RN, BSN, Kim on 05/31/2015 08:46:38 Sanderlin, [name] ([id number]) -------------------------------------------------------------------------------- Multi-Disciplinary Care Plan Details Patient Name: Perotti, Sarae H. Date of Service: 05/31/2015 8:15 AM Medical Record Patient Account Number: [id number] Number: Treating RN: Huel Coventry [birth year] (58 y.o. Other Clinician: Date of Birth/Sex: Female) Treating BURNS III, Primary Care Physician: Fidel Levy Physician/Extender: Zollie Beckers Referring Physician: Jones Broom in Treatment: 36 Active Inactive Orientation to the Wound Care Program Nursing Diagnoses: Knowledge deficit related to the wound healing center program Goals: Patient/caregiver will verbalize understanding of the Wound Healing Center Program Date Initiated: 01/30/2015 Goal Status: Active Interventions: Provide education on orientation to the wound center Notes: Pain, Acute or Chronic Nursing Diagnoses: Pain, acute or chronic: actual or potential Goals: Patient will verbalize adequate pain control and receive pain control interventions during procedures as needed Date Initiated: 01/30/2015 Goal Status: Active Patient/caregiver will verbalize adequate pain control between visits Date Initiated: 01/30/2015 Goal Status: Active Interventions: Assess comfort goal upon admission Encourage patient to take pain medications as prescribed Notes: Venous  Leg Ulcer Carlin, Maelani H. ([id number]) Nursing Diagnoses: Potential for venous Insuffiency (use before diagnosis confirmed) Goals: Non-invasive venous studies are completed as ordered Date Initiated: 01/30/2015 Goal Status: Active Interventions: Assess peripheral edema status every visit. Notes: Wound/Skin Impairment Nursing Diagnoses: Impaired tissue integrity Goals: Ulcer/skin breakdown will have a volume  reduction of 30% by week 4 Date Initiated: 01/30/2015 Goal Status: Active Ulcer/skin breakdown will have a volume reduction of 50% by week 8 Date Initiated: 01/30/2015 Goal Status: Active Ulcer/skin breakdown will have a volume reduction of 80% by week 12 Date Initiated: 01/30/2015 Goal Status: Active Ulcer/skin breakdown will heal within 14 weeks Date Initiated: 01/30/2015 Goal Status: Active Interventions: Assess ulceration(s) every visit Notes: Electronic Signature(s) Signed: 06/01/2015 5:49:44 PM By: Elliot Gurney, RN, BSN, Kim RN, BSN Entered By: Elliot Gurney, RN, BSN, Kim on 05/31/2015 08:46:32 Maffia, Teniqua Rexene Edison ([id number]) -------------------------------------------------------------------------------- Pain Assessment Details Patient Name: Janusz, Nicle H. Date of Service: 05/31/2015 8:15 AM Medical Record Patient Account Number: [id number] Number: Treating RN: Huel Coventry 06-16-1956 (58 y.o. Other Clinician: Date of Birth/Sex: Female) Treating BURNS III, Primary Care Physician: Fidel Levy Physician/Extender: Zollie Beckers Referring Physician: Jones Broom in Treatment: 17 Active Problems Location of Pain Severity and Description of Pain Patient Has Paino No Site Locations Pain Management and Medication Current Pain Management: Electronic Signature(s) Signed: 06/01/2015 5:49:44 PM By: Elliot Gurney, RN, BSN, Kim RN, BSN Entered By: Elliot Gurney, RN, BSN, Kim on 05/31/2015 08:15:29 Goodroe, [name] ([id number]) -------------------------------------------------------------------------------- Patient/Caregiver Education Details Patient Name: Berninger, Constantina H. Date of Service: 05/31/2015 8:15 AM Medical Record Patient Account Number: [id number] Number: Treating RN: Huel Coventry 09/05/1955 (58 y.o. Other Clinician: Date of Birth/Gender: Female) Treating BURNS III, Primary Care Physician: Fidel Levy Physician/Extender: Zollie Beckers Referring Physician:  Jones Broom in Treatment: 17 Education Assessment Education Provided To: Patient Education Topics Provided Wound/Skin Impairment: Handouts: Caring for Your Ulcer, Other: hospital admission Methods: Demonstration, Explain/Verbal Responses: State content correctly Electronic Signature(s) Signed: 06/01/2015 5:49:44 PM By: Elliot Gurney, RN, BSN, Kim RN, BSN Entered By: Elliot Gurney, RN, BSN, Kim on 05/31/2015 09:11:56 Hollerbach, [name] ([id number]) -------------------------------------------------------------------------------- Wound Assessment Details Patient Name: Pinkhasov, Austine H. Date of Service: 05/31/2015 8:15 AM Medical Record Patient Account Number: [id number] Number: Treating RN: Huel Coventry [birth year] (58 y.o. Other Clinician: Date of Birth/Sex: Female) Treating BURNS III, Primary Care Physician: Fidel Levy Physician/Extender: Zollie Beckers Referring Physician: Jones Broom in Treatment: 17 Wound Status Wound Number: 1 Primary Etiology: Calciphylaxis Wound Location: Right, Proximal Lower Leg Wound Status: Open Wounding Event: Gradually Appeared Date Acquired: 01/09/2015 Weeks Of Treatment: 17 Clustered Wound: No Photos Photo Uploaded By: Elliot Gurney, RN, BSN, Kim on 05/31/2015 12:13:51 Wound Measurements Length: (cm) 8 Width: (cm) 8 Depth: (cm) 0.6 Area: (cm) 50.265 Volume: (cm) 30.159 % Reduction in Area: -106846.8% % Reduction in Volume: -603080% Wound Description Full Thickness Without Exposed Classification: Support Structures Periwound Skin Texture Texture Color No Abnormalities Noted: No No Abnormalities Noted: No Moisture No Abnormalities Noted: No Cahall, Madina H. ([id number]) Treatment Notes Wound #1 (Right, Proximal Lower Leg) 4. Dressing Applied: Aquacel Ag 5. Secondary Dressing Applied Gauze and Kerlix/Conform Electronic Signature(s) Signed: 06/01/2015 5:49:44 PM By: Elliot Gurney, RN, BSN, Kim RN, BSN Entered By: Elliot Gurney,  RN, BSN, Kim on 05/31/2015 24:40:10 Chillemi, [name] ([id number]) -------------------------------------------------------------------------------- Wound Assessment Details Patient Name: Dolby, Skyanne H. Date of Service: 05/31/2015 8:15 AM Medical Record Patient Account Number: [id number] Number: Treating RN: Huel Coventry Oct 29, 1955 (58 y.o. Other Clinician: Date of Birth/Sex: Female) Treating  BURNS III, Primary Care Physician: Fidel Levy Physician/Extender: Zollie Beckers Referring Physician: Jones Broom in Treatment: 17 Wound Status Wound Number: 2 Primary Etiology: Calciphylaxis Wound Location: Right, Distal Lower Leg Wound Status: Open Wounding Event: Gradually Appeared Date Acquired: 01/09/2015 Weeks Of Treatment: 17 Clustered Wound: No Photos Photo Uploaded By: Elliot Gurney, RN, BSN, Kim on 05/31/2015 12:13:52 Wound Measurements Length: (cm) 12 Width: (cm) 5 Depth: (cm) 0.3 Area: (cm) 47.124 Volume: (cm) 14.137 % Reduction in Area: -50031.9% % Reduction in Volume: -156977.8% Wound Description Full Thickness Without Exposed Classification: Support Structures Periwound Skin Texture Texture Color No Abnormalities Noted: No No Abnormalities Noted: No Moisture No Abnormalities Noted: No Delmore, Timber H. ([id number]) Treatment Notes Wound #2 (Right, Distal Lower Leg) 4. Dressing Applied: Aquacel Ag 5. Secondary Dressing Applied Gauze and Kerlix/Conform Electronic Signature(s) Signed: 06/01/2015 5:49:44 PM By: Elliot Gurney, RN, BSN, Kim RN, BSN Entered By: Elliot Gurney, RN, BSN, Kim on 05/31/2015 40:98:11 Vandergriff, [name] ([id number]) -------------------------------------------------------------------------------- Wound Assessment Details Patient Name: Luper, Marijane H. Date of Service: 05/31/2015 8:15 AM Medical Record Patient Account Number: [id number] Number: Treating RN: Huel Coventry November 04, 1955 (58 y.o. Other Clinician: Date of  Birth/Sex: Female) Treating BURNS III, Primary Care Physician: Fidel Levy Physician/Extender: Zollie Beckers Referring Physician: Jones Broom in Treatment: 17 Wound Status Wound Number: 3 Primary Etiology: Calciphylaxis Wound Location: Left, Lateral Lower Leg Wound Status: Open Wounding Event: Gradually Appeared Date Acquired: 01/09/2015 Weeks Of Treatment: 17 Clustered Wound: No Photos Photo Uploaded By: Elliot Gurney, RN, BSN, Kim on 05/31/2015 12:14:25 Wound Measurements Length: (cm) 23 Width: (cm) 13 Depth: (cm) 1.5 Area: (cm) 234.834 Volume: (cm) 352.251 % Reduction in Area: -2035.6% % Reduction in Volume: -31922.8% Wound Description Full Thickness Without Exposed Classification: Support Structures Periwound Skin Texture Texture Color No Abnormalities Noted: No No Abnormalities Noted: No Moisture No Abnormalities Noted: No Lessley, Laquia H. ([id number]) Treatment Notes Wound #3 (Left, Lateral Lower Leg) 4. Dressing Applied: Aquacel Ag 5. Secondary Dressing Applied Gauze and Kerlix/Conform Electronic Signature(s) Signed: 06/01/2015 5:49:44 PM By: Elliot Gurney, RN, BSN, Kim RN, BSN Entered By: Elliot Gurney, RN, BSN, Kim on 05/31/2015 46:96:29 Lanzer, [name] ([id number]) -------------------------------------------------------------------------------- Wound Assessment Details Patient Name: Trick, Darrah H. Date of Service: 05/31/2015 8:15 AM Medical Record Patient Account Number: [id number] Number: Treating RN: Huel Coventry 18-Sep-1955 (58 y.o. Other Clinician: Date of Birth/Sex: Female) Treating BURNS III, Primary Care Physician: Fidel Levy Physician/Extender: Zollie Beckers Referring Physician: Jones Broom in Treatment: 17 Wound Status Wound Number: 8 Primary Etiology: Calciphylaxis Wound Location: Right, Posterior Lower Leg Wound Status: Open Wounding Event: Gradually Appeared Date Acquired: 03/27/2015 Weeks Of Treatment:  8 Clustered Wound: No Photos Photo Uploaded By: Elliot Gurney, RN, BSN, Kim on 05/31/2015 12:14:26 Wound Measurements Length: (cm) 10 Width: (cm) 10 Depth: (cm) 0.3 Area: (cm) 78.54 Volume: (cm) 23.562 % Reduction in Area: -4344.8% % Reduction in Volume: -13211.9% Wound Description Full Thickness Without Exposed Classification: Support Structures Ebeling, Naiomy H. ([id number]) Periwound Skin Texture Texture Color No Abnormalities Noted: No No Abnormalities Noted: No Moisture No Abnormalities Noted: No Treatment Notes Wound #8 (Right, Posterior Lower Leg) 4. Dressing Applied: Aquacel Ag 5. Secondary Dressing Applied Gauze and Kerlix/Conform Electronic Signature(s) Signed: 06/01/2015 5:49:44 PM By: Elliot Gurney, RN, BSN, Kim RN, BSN Entered By: Elliot Gurney, RN, BSN, Kim on 05/31/2015 64:40:34 Ra, [name] ([id number]) -------------------------------------------------------------------------------- Wound Assessment Details Patient Name: Arave, Jayliana H. Date of Service: 05/31/2015 8:15 AM Medical Record Patient Account Number: [id number] Number: Treating RN: Huel Coventry 06-27-1956 (58  y.o. Other Clinician: Date of Birth/Sex: Female) Treating BURNS III, Primary Care Physician: Fidel Levy Physician/Extender: Zollie Beckers Referring Physician: Jones Broom in Treatment: 17 Wound Status Wound Number: 9 Primary Etiology: Calciphylaxis Wound Location: Left, Posterior Lower Leg Wound Status: Open Wounding Event: Gradually Appeared Date Acquired: 03/13/2015 Weeks Of Treatment: 5 Clustered Wound: No Photos Photo Uploaded By: Elliot Gurney, RN, BSN, Kim on 05/31/2015 12:15:27 Wound Measurements Length: (cm) 4 Width: (cm) 2.8 Depth: (cm) 0.3 Area: (cm) 8.796 Volume: (cm) 2.639 % Reduction in Area: 44% % Reduction in Volume: 16% Wound Description Full Thickness Without Exposed Classification: Support Structures Periwound Skin Texture Texture Color No  Abnormalities Noted: No No Abnormalities Noted: No Moisture No Abnormalities Noted: No Rake, Siani Rexene Edison ([id number]) Electronic Signature(s) Signed: 06/01/2015 5:49:44 PM By: Elliot Gurney, RN, BSN, Kim RN, BSN Entered By: Elliot Gurney, RN, BSN, Kim on 05/31/2015 08:28:12 Zentz, [name] ([id number]) -------------------------------------------------------------------------------- Vitals Details Patient Name: Eissler, Dasia H. Date of Service: 05/31/2015 8:15 AM Medical Record Patient Account Number: [id number] Number: Treating RN: Huel Coventry 24-Apr-1956 (58 y.o. Other Clinician: Date of Birth/Sex: Female) Treating BURNS III, Primary Care Physician: Fidel Levy Physician/Extender: Zollie Beckers Referring Physician: Jones Broom in Treatment: 17 Vital Signs Time Taken: 08:15 Temperature (F): 98.0 Height (in): 68 Pulse (bpm): 81 Weight (lbs): 294 Respiratory Rate (breaths/min): 18 Body Mass Index (BMI): 44.7 Blood Pressure (mmHg): 151/111 Reference Range: 80 - 120 mg / dl Electronic Signature(s) Signed: 06/01/2015 5:49:44 PM By: Elliot Gurney, RN, BSN, Kim RN, BSN Entered By: Elliot Gurney, RN, BSN, Kim on 05/31/2015 08:16:10

## 2015-06-03 NOTE — Progress Notes (Signed)
DONATELLA, WALSKI ([id number]) Visit Report for 05/31/2015 Chief Complaint Document Details Patient Name: [name]. Date of Service: 05/31/2015 8:15 AM Medical Record Patient Account Number: [id number] Number: Treating RN: Huel Coventry 1956-05-26 (58 y.o. Other Clinician: Date of Birth/Sex: Female) Treating BURNS III, Primary Care Physician/Extender: Karn Cassis Physician: Referring Physician: Jones Broom in Treatment: 17 Information Obtained from: Patient Chief Complaint Bilateral calf ulcers. Electronic Signature(s) Signed: 05/31/2015 4:30:30 PM By: Madelaine Bhat MD Entered By: Madelaine Bhat on 05/31/2015 10:03:02 [name] ([id number]) -------------------------------------------------------------------------------- Debridement Details Patient Name: [name], [name] Date of Service: 05/31/2015 8:15 AM Medical Record Patient Account Number: [id number] Number: Treating RN: Huel Coventry [birth year] (58 y.o. Other Clinician: Date of Birth/Sex: Female) Treating BURNS III, Primary Care Physician/Extender: Karn Cassis Physician: Referring Physician: Jones Broom in Treatment: 17 Debridement Performed for Wound #1 Right,Proximal Lower Leg Assessment: Performed By: Physician BURNS III, Melanie Crazier., MD Debridement: Debridement Pre-procedure Yes Verification/Time Out Taken: Start Time: 08:30 Pain Control: Lidocaine 2% Topical Gel Level: Skin/Subcutaneous Tissue Total Area Debrided (L x 4 (cm) x 4 (cm) = 16 (cm) W): Tissue and other Non-Viable, Eschar, Exudate, Fat, Fibrin/Slough, Subcutaneous material debrided: Instrument: Scissors Bleeding: Minimum Hemostasis Achieved: Pressure End Time: 08:40 Procedural Pain: 5 Post Procedural Pain: 5 Response to Treatment: Procedure was tolerated well Post Debridement Measurements of Total Wound Length: (cm) 8 Width: (cm) 8 Depth:  (cm) 0.7 Volume: (cm) 35.186 Post Procedure Diagnosis Same as Pre-procedure Electronic Signature(s) Signed: 05/31/2015 4:30:30 PM By: Madelaine Bhat MD Signed: 06/01/2015 5:49:44 PM By: Elliot Gurney RN, BSN, Kim RN, BSN Entered By: Madelaine Bhat on 05/31/2015 10:02:51 [name] ([id number]) [name], [name] ([id number]) -------------------------------------------------------------------------------- HPI Details Patient Name: [name], [name] Date of Service: 05/31/2015 8:15 AM Medical Record Patient Account Number: [id number] Number: Treating RN: Huel Coventry June 12, 1956 (58 y.o. Other Clinician: Date of Birth/Sex: Female) Treating BURNS III, Primary Care Physician/Extender: Karn Cassis Physician: Referring Physician: Jones Broom in Treatment: 17 History of Present Illness HPI Description: 59 year old with h/o DM (Hgb A1c 7.4 in Aug 2016), ESRD (on hemodialysis since July 2016). Presented to her PCP Dr. Venora Maples for BLE ulcers since June 2016. She was taken to the OR on 03/13/2015 for a angioplasty of her left arm AV fistula. No lower extremity debridement or biopsies. 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 No record of venous US to assess for venous insufficiency. Patient has declined [place]. Culture 04/19/2015 grew methicillin sensitive staph aureus (sensitive to tetracycline), Stenotrophomonas maltophilia, and Enterococcus faecalis. Completed doxycycline. Performing dressing changes with silver alginate. Tolerating Profore light bilaterally. Declined operative debridement and punch biopsy. Has not been seen in clinic for 2 weeks. She returns to clinic and reports worsening pain, redness, and malodorous drainage. No fever or chills. Electronic Signature(s) Signed: 05/31/2015 4:30:30 PM By: Madelaine Bhat MD Entered By: Madelaine Bhat on 05/31/2015 10:05:30 [name] ([id number]) -------------------------------------------------------------------------------- Physical Exam Details Patient Name: [name], [name] Date of Service: 05/31/2015 8:15 AM Medical Record Patient Account Number: [id number] Number: Treating RN: Huel Coventry 1956-01-28 (58 y.o. Other Clinician: Date of Birth/Sex: Female) Treating BURNS III, Primary Care Physician/Extender: Karn Cassis Physician: Referring Physician: Fidel Levy Weeks in Treatment: 17 Constitutional . Pulse regular. Respirations normal and unlabored. Afebrile. Marland Kitchen Respiratory WNL. No retractions.Marland Kitchen  Cardiovascular Pedal Pulses WNL. Integumentary (Hair, Skin) .Marland Kitchen Neurological Sensation normal to touch, pin,and vibration. Psychiatric Judgement and insight Intact.. Oriented times 3.. . Notes Bilateral calf ulcerations significantly worse today. Increased in size. Increased soft tissue necrosis. Extensive malodorous drainage. Recurrent cellulitis, right greater than left. Eschar and necrotic fat sharply debrided. Biopsy culture from right calf obtained. No exposed deep structures at this time. 2+ pitting edema. Palpable DP bilaterally. Triphasic on the right. Biphasic on the left. Noncompressible. Electronic Signature(s) Signed: 05/31/2015 4:30:30 PM By: Madelaine Bhat MD Entered By: Madelaine Bhat on 05/31/2015 10:07:14 [name] ([id number]) -------------------------------------------------------------------------------- Physician Orders Details Patient Name: [name], [name] Date of Service: 05/31/2015 8:15 AM Medical Record Patient Account Number: [id number] Number: Treating RN: Huel Coventry 1955-12-02 (58 y.o. Other Clinician: Date of Birth/Sex: Female) Treating BURNS III, Primary Care Physician/Extender: Karn Cassis Physician: Referring  Physician: Jones Broom in Treatment: 4 Verbal / Phone Orders: Yes Clinician: Huel Coventry Read Back and Verified: Yes Diagnosis Coding Anesthetic Wound #1 Right,Proximal Lower Leg o Topical Lidocaine 4% cream applied to wound bed prior to debridement Wound #2 Right,Distal Lower Leg o Topical Lidocaine 4% cream applied to wound bed prior to debridement Wound #3 Left,Lateral Lower Leg o Topical Lidocaine 4% cream applied to wound bed prior to debridement Wound #8 Right,Posterior Lower Leg o Topical Lidocaine 4% cream applied to wound bed prior to debridement Wound #9 Left,Posterior Lower Leg o Topical Lidocaine 4% cream applied to wound bed prior to debridement Primary Wound Dressing Wound #1 Right,Proximal Lower Leg o ABD Pad Wound #2 Right,Distal Lower Leg o ABD Pad Wound #3 Left,Lateral Lower Leg o ABD Pad Wound #8 Right,Posterior Lower Leg o ABD Pad Wound #9 Left,Posterior Lower Leg o ABD Pad Secondary Dressing Wound #1 Right,Proximal Lower Leg Cronk, Dejanay H. ([id number]) o Conform/Kerlix Wound #2 Right,Distal Lower Leg o Conform/Kerlix Wound #3 Left,Lateral Lower Leg o Conform/Kerlix Wound #8 Right,Posterior Lower Leg o Conform/Kerlix Wound #9 Left,Posterior Lower Leg o Conform/Kerlix Follow-up Appointments Wound #1 Right,Proximal Lower Leg o Return Appointment in 1 week. Wound #2 Right,Distal Lower Leg o Return Appointment in 1 week. Wound #3 Left,Lateral Lower Leg o Return Appointment in 1 week. Wound #8 Right,Posterior Lower Leg o Return Appointment in 1 week. Wound #9 Left,Posterior Lower Leg o Return Appointment in 1 week. Notes Patient to go to ED for admission. Electronic Signature(s) Signed: 05/31/2015 4:30:30 PM By: Madelaine Bhat MD Signed: 06/01/2015 5:49:44 PM By: Elliot Gurney RN, BSN, Kim RN, BSN Entered By: Elliot Gurney, RN, BSN, Kim on 05/31/2015 09:01:35 Berman, [name]  ([id number]) -------------------------------------------------------------------------------- Problem List Details Patient Name: [name], [name] Date of Service: 05/31/2015 8:15 AM Medical Record Patient Account Number: [id number] Number: Treating RN: Huel Coventry 09/12/1955 (58 y.o. 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/30/2015 Yes exposed L97.212 Non-pressure chronic ulcer of right calf with fat layer 01/30/2015 Yes exposed R60.0 Localized edema 04/19/2015 Yes L03.115 Cellulitis of right lower limb 05/31/2015 Yes L03.116 Cellulitis of left lower limb 05/31/2015 Yes Inactive Problems Resolved Problems Electronic Signature(s) Signed: 05/31/2015 4:30:30 PM By: Madelaine Bhat MD Entered By: Madelaine Bhat on 05/31/2015 10:01:36  [name] ([id number]) [name] ([id number]) -------------------------------------------------------------------------------- Progress Note Details Patient Name: [name], [name] Date of Service: 05/31/2015 8:15 AM Medical Record Patient Account Number: [id number] Number: Treating RN: Huel Coventry 1955-12-07 (58 y.o. Other Clinician: Date of Birth/Sex: Female) Treating BURNS III, Primary Care Physician/Extender: Karn Cassis Physician: Referring Physician: Jones Broom in Treatment: 17 Subjective Chief Complaint Information obtained from Patient Bilateral calf ulcers. History of Present Illness (HPI) 59 year old with h/o DM (Hgb A1c 7.4 in Aug 2016), ESRD (on  hemodialysis since July 2016). Presented to her PCP Dr. Venora Maples for BLE ulcers since June 2016. She was taken to the OR on 03/13/2015 for a angioplasty of her left arm AV fistula. No lower extremity debridement or biopsies. 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 No record of venous US to assess for venous insufficiency. Patient has declined [place]. Culture 04/19/2015 grew methicillin sensitive staph aureus (sensitive to tetracycline), Stenotrophomonas maltophilia, and Enterococcus faecalis. Completed doxycycline. Performing dressing changes with silver alginate. Tolerating Profore light bilaterally. Declined operative debridement and punch biopsy. Has not been seen in clinic for 2 weeks. She returns to clinic and reports worsening pain, redness, and malodorous drainage. No fever or chills. Objective Constitutional Pulse regular. Respirations normal and unlabored. Afebrile. Vitals Time Taken: 8:15 AM, Height: 68 in, Weight: 294 lbs, BMI: 44.7, Temperature: 98.0 F, Pulse: 81 bpm, Respiratory Rate: 18 breaths/min, Blood Pressure: 151/111 mmHg. Respiratory Knope, Shaunna H. ([id number]) WNL. No retractions.. Cardiovascular Pedal Pulses WNL. Neurological Sensation normal to touch, pin,and vibration. Psychiatric Judgement and insight Intact.. Oriented times 3.. General Notes: Bilateral calf ulcerations significantly worse today. Increased in size. Increased soft tissue necrosis. Extensive malodorous drainage. Recurrent cellulitis, right greater than left. Eschar and necrotic fat sharply debrided. Biopsy culture from right calf obtained. No exposed deep structures at this time. 2+ pitting edema. Palpable DP bilaterally. Triphasic on the right. Biphasic on the left. Noncompressible. Integumentary (Hair, Skin) Wound #1 status is Open. Original cause of wound was Gradually  Appeared. The wound is located on the Right,Proximal Lower Leg. The wound measures 8cm length x 8cm width x 0.6cm depth; 50.265cm^2 area and 30.159cm^3 volume. Wound #2 status is Open. Original cause of wound was Gradually Appeared. The wound is located on the Right,Distal Lower Leg. The wound measures 12cm length x 5cm width x 0.3cm depth; 47.124cm^2 area and 14.137cm^3 volume. Wound #3 status is Open. Original cause of wound was Gradually Appeared. The wound is located on the Left,Lateral Lower Leg. The wound measures 23cm length x 13cm width x 1.5cm depth; 234.834cm^2 area and 352.251cm^3 volume. Wound #8 status is Open. Original cause of wound was Gradually Appeared. The wound is located on the Right,Posterior Lower Leg. The wound measures 10cm length x 10cm width x 0.3cm depth; 78.54cm^2 area and 23.562cm^3 volume. Wound #9 status is Open. Original cause of wound was Gradually Appeared. The wound is located on the Left,Posterior Lower Leg. The wound measures 4cm length x 2.8cm width x 0.3cm depth; 8.796cm^2 area and 2.639cm^3 volume. Assessment Active Problems ICD-10 E11.622 - Type 2 diabetes mellitus with other skin ulcer N18.6 - End stage renal disease Dietzman, Vivi H. 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 There was a Skin/Subcutaneous Tissue Debridement ([id number](11042-11047) debridement with total area of 16 sq cm performed by BURNS III, Melanie CrazierWALTER W., MD. with the following instrument(s): Scissors to remove Non-Viable tissue/material including Exudate, Fat, Fibrin/Slough, Eschar,  and Subcutaneous after achieving pain control using Lidocaine 2% Topical Gel. A time out was conducted prior to the start of the procedure. A Minimum amount of bleeding was controlled with Pressure. The procedure was tolerated well with a pain level of 5 throughout and a pain level of 5 following the procedure. Post Debridement Measurements: 8cm length x 8cm width x 0.7cm depth; 35.186cm^3 volume. Post procedure Diagnosis Wound #1: Same as Pre-Procedure Plan Anesthetic: Wound #1 Right,Proximal Lower Leg: Topical Lidocaine 4% cream applied to wound bed prior to debridement Wound #2 Right,Distal Lower Leg: Topical Lidocaine 4% cream applied to wound bed prior to debridement Wound #3 Left,Lateral Lower Leg: Topical Lidocaine 4% cream applied to wound bed prior to debridement Wound #8 Right,Posterior Lower Leg: Topical Lidocaine 4% cream applied to wound bed prior to debridement Wound #9 Left,Posterior Lower Leg: Topical Lidocaine 4% cream applied to wound bed prior to debridement Primary Wound Dressing: Wound #1 Right,Proximal Lower Leg: ABD Pad Wound #2 Right,Distal Lower Leg: ABD Pad Demetriou, Abegail H. ([id number]) Wound #3 Left,Lateral Lower Leg: ABD Pad Wound #8 Right,Posterior Lower Leg: ABD Pad Wound #9 Left,Posterior Lower Leg: ABD Pad Secondary Dressing: Wound #1 Right,Proximal Lower Leg: Conform/Kerlix Wound #2 Right,Distal Lower Leg: Conform/Kerlix Wound #3 Left,Lateral Lower Leg: Conform/Kerlix Wound #8 Right,Posterior Lower Leg: Conform/Kerlix Wound #9 Left,Posterior Lower Leg: Conform/Kerlix Follow-up Appointments: Wound #1 Right,Proximal Lower Leg: Return Appointment in 1 week. Wound #2 Right,Distal Lower Leg: Return Appointment in 1 week. Wound #3 Left,Lateral Lower Leg: Return Appointment in 1 week. Wound #8 Right,Posterior Lower Leg: Return Appointment in 1 week. Wound #9 Left,Posterior Lower Leg: Return Appointment in 1 week. General Notes: Patient  to go to ED for admission. I explained that she is significantly worse today, and I have recommended hospital admission for IV antibiotics and consideration of debridement under anesthesia and skin biopsy to help better define the etiology of her problem. She was tearful but agreeable to be hospitalized. I spoke with Dr. Scotty CourtStafford in the [facility] emergency room. Electronic Signature(s) Signed: 05/31/2015 4:30:30 PM By: Madelaine BhatBurns, III, Walter MD Entered By: Madelaine BhatBurns, III, Walter on 05/31/2015 10:10:17 Plato, [name] EdelmanPAMELA H. ([id number]) -------------------------------------------------------------------------------- SuperBill Details Patient Name: Liska, Geovanna H. Date of Service: 05/31/2015 Medical Record Patient Account Number: [id number] Number: Treating RN: Huel CoventryWoody, Kim 26-Sep-1955 (58 y.o. Other Clinician: Date of Birth/Sex: Female) Treating BURNS III, Primary Care Physician/Extender: Karn CassisWALTER HAWKINS JR, JAMES Physician: Weeks in Treatment: 17 Referring Physician: Fidel LevyHAWKINS JR, JAMES Diagnosis Coding ICD-10 Codes Code Description [phone] Type 2 diabetes mellitus with other skin ulcer N18.6 End stage renal disease L97.222 Non-pressure chronic ulcer of left calf with fat layer exposed L97.212 Non-pressure chronic ulcer of right calf with fat layer exposed R60.0 Localized edema L03.115 Cellulitis of right lower limb L03.116 Cellulitis of left lower limb Facility Procedures CPT4 Code: [id number] Description: 11042 - DEB SUBQ TISSUE 20 SQ CM/< ICD-10 Description Diagnosis L97.212 Non-pressure chronic ulcer of right calf with fat Modifier: layer exposed Quantity: 1 Physician Procedures CPT4 Code: [id number] Description: 99214 - WC PHYS LEVEL 4 - EST PT ICD-10 Description Diagnosis L03.115 Cellulitis of right lower limb L03.116 Cellulitis of  left lower limb Modifier: Quantity: 1 CPT4 Code: 1610960 Description: 11042 - WC PHYS SUBQ TISS 20 SQ CM ICD-10 Description Diagnosis  L97.212 Non-pressure chronic ulcer of right calf with fat Modifier: layer exposed Quantity: 1 Electronic Signature(s) LOYALTY, ARENTZ ([id number]) Signed: 05/31/2015 4:30:30 PM By: Madelaine Bhat MD Entered By: Madelaine Bhat on 05/31/2015 10:10:45

## 2015-06-03 NOTE — Care Management Note (Signed)
Case Management Note  Patient Details  Name: Germaine Pomfretamela H Zuver MRN: [id number] Date of Birth: Feb 20, 1956  Subjective/Objective:   Discharge information faxed and called to Britta MccreedyBarbara at [facility] requesting home health RN for wound care. Wound care instructions are in the Discharge Summary. Britta MccreedyBarbara reports that a hospital bed is scheduled to be delivered to Ms Malson's home between 2pm and 6pm today. Hospital bed was ordered yesterday.                 Action/Plan:   Expected Discharge Date:                  Expected Discharge Plan:     In-House Referral:     Discharge planning Services     Post Acute Care Choice:    Choice offered to:     DME Arranged:    DME Agency:     HH Arranged:    HH Agency:     Status of Service:     Medicare Important Message Given:    Date Medicare IM Given:    Medicare IM give by:    Date Additional Medicare IM Given:    Additional Medicare Important Message give by:     If discussed at Long Length of Stay Meetings, dates discussed:    Additional Comments:  Aniyia Rane A, RN 06/03/2015, 1:36 PM

## 2015-06-03 NOTE — Progress Notes (Signed)
IV was removed. Discharge instructions, follow-up appointments, and prescriptions were provided to the pt. The pt was taken downstairs via wheelchair. All questions answered. Son at bedside.

## 2015-06-03 NOTE — Discharge Instructions (Addendum)
Please follow-up for dialysis tomorrow Keep legs elevated. New hospital bed should assist with this. He will also have home health nursing for wound care. Your Unna boots should be changed every other day   DIET:  Cardiac diet, Diabetic diet and Renal diet  DISCHARGE CONDITION:  Fair  ACTIVITY:  Activity as tolerated  OXYGEN:  Home Oxygen: No.   Oxygen Delivery: room air  DISCHARGE LOCATION:  home   If you experience worsening of your admission symptoms, develop shortness of breath, life threatening emergency, suicidal or homicidal thoughts you must seek medical attention immediately by calling 911 or calling your MD immediately  if symptoms less severe.  You Must read complete instructions/literature along with all the possible adverse reactions/side effects for all the Medicines you take and that have been prescribed to you. Take any new Medicines after you have completely understood and accpet all the possible adverse reactions/side effects.   Please note  You were cared for by a hospitalist during your hospital stay. If you have any questions about your discharge medications or the care you received while you were in the hospital after you are discharged, you can call the unit and asked to speak with the hospitalist on call if the hospitalist that took care of you is not available. Once you are discharged, your primary care physician will handle any further medical issues. Please note that NO REFILLS for any discharge medications will be authorized once you are discharged, as it is imperative that you return to your primary care physician (or establish a relationship with a primary care physician if you do not have one) for your aftercare needs so that they can reassess your need for medications and monitor your lab values.   Cellulitis Cellulitis is an infection of the skin and the tissue under the skin. The infected area is usually red and tender. This happens most often in the  arms and lower legs. HOME CARE   Take your antibiotic medicine as told. Finish the medicine even if you start to feel better.  Keep the infected arm or leg raised (elevated).  Put a warm cloth on the area up to 4 times per day.  Only take medicines as told by your doctor.  Keep all doctor visits as told. GET HELP IF:  You see red streaks on the skin coming from the infected area.  Your red area gets bigger or turns a dark color.  Your bone or joint under the infected area is painful after the skin heals.  Your infection comes back in the same area or different area.  You have a puffy (swollen) bump in the infected area.  You have new symptoms.  You have a fever. GET HELP RIGHT AWAY IF:   You feel very sleepy.  You throw up (vomit) or have watery poop (diarrhea).  You feel sick and have muscle aches and pains.   This information is not intended to replace advice given to you by your health care provider. Make sure you discuss any questions you have with your health care provider.   Document Released: 12/18/2007 Document Revised: 03/22/2015 Document Reviewed: 09/16/2011 Elsevier Interactive Patient Education [employer]2016 [employer]

## 2015-06-03 NOTE — Progress Notes (Signed)
[facility]  ROUNDING NOTE   Subjective:  Patient seen at bedside. Bilateral LE's wrapped. Had infiltration of access yesterday. Hospital bed to be delivered to pt's home.  Objective:  Vital signs in last 24 hours:  Temp:  [97.3 F (36.3 C)-98.4 F (36.9 C)] 98.4 F (36.9 C) (11/19 0554) Pulse Rate:  [75-91] 85 (11/19 0956) Resp:  [17-24] 18 (11/19 0554) BP: (89-110)/(48-77) 103/58 mmHg (11/19 0956) SpO2:  [95 %-100 %] 95 % (11/19 0956)  Weight change:  Filed Weights   05/31/15 0934 05/31/15 1635  Weight: 117.935 kg (260 lb) 120.022 kg (264 lb 9.6 oz)    Intake/Output: I/O last 3 completed shifts: In: 240 [P.O.:240] Out: 2867 [Urine:1224; Other:1643]   Intake/Output this shift:     Physical Exam: General: NAD, sitting up in chair.  Head: Normocephalic, atraumatic. Moist oral mucosal membranes  Eyes: Anicteric  Neck: Supple, trachea midline  Lungs:  Clear to auscultation normal effort  Heart: Regular rate and rhythm  Abdomen:  Soft, nontender, BS present  Extremities:  3+ peripheral edema, bilateral LE's wrapped  Neurologic: Nonfocal, moving all four extremities  Skin: Wounds on b/l LE's wrapped  Access: LUE AVF with infiltration noted    Basic Metabolic Panel:  Recent Labs Lab 05/31/15 0959 06/01/15 0355 06/02/15 1356 06/03/15 0658  NA 138 138 137  --   K 3.4* 3.3* 3.4*  --   CL 104 103 100*  --   CO2 --   GLUCOSE 151* 162* 164*  --   BUN 22* 27* 17  --   CREATININE 3.47* 3.75* 3.00*  --   CALCIUM 8.4* 8.5* 8.2*  --   PHOS  --   --  3.8  3.8 4.9*    Liver Function Tests:  Recent Labs Lab 05/31/15 0959 06/02/15 1356  AST 14*  --   ALT 11*  --   ALKPHOS 123  --   BILITOT 0.5  --   PROT 7.1  --   ALBUMIN 2.8* 2.5*   No results for input(s): LIPASE, AMYLASE in the last 168 hours. No results for input(s): AMMONIA in the last 168 hours.  CBC:  Recent Labs Lab 05/31/15 0959 06/01/15 0355 06/02/15 1356  WBC  11.4* 10.8 9.4  NEUTROABS 8.0*  --   --   HGB 10.6* 10.1* 10.2*  HCT 33.6* 30.9* 31.2*  MCV 88.1 88.5 88.3  PLT 464* 416 357    Cardiac Enzymes: No results for input(s): CKTOTAL, CKMB, CKMBINDEX, TROPONINI in the last 168 hours.  BNP: Invalid input(s): POCBNP  CBG:  Recent Labs Lab 06/02/15 0749 06/02/15 1143 06/02/15 1603 06/02/15 2106 06/03/15 0739  GLUCAP 122* 104* 149* 179* 99    Microbiology: Results for orders placed or performed during the hospital encounter of 05/31/15  Wound culture     Status: None (Preliminary result)   Collection Time: 05/31/15  8:42 AM  Result Value Ref Range Status   Specimen Description WOUND  Final   Special Requests NONE  Final   Gram Stain   Final    FEW WBC SEEN FEW GRAM NEGATIVE RODS FEW GRAM POSITIVE COCCI IN PAIRS    Culture   Final    HEAVY GROWTH GRAM NEGATIVE RODS IDENTIFICATION AND SUSCEPTIBILITIES TO FOLLOW TRYING TO ISOLATE MULTIPLE OTHER POTENTIAL PATHOGENS    Report Status PENDING  Incomplete    Coagulation Studies: No results for input(s): LABPROT, INR in the last 72 hours.  Urinalysis: No results for input(s):  COLORURINE, LABSPEC, PHURINE, GLUCOSEU, HGBUR, BILIRUBINUR, KETONESUR, PROTEINUR, UROBILINOGEN, NITRITE, LEUKOCYTESUR in the last 72 hours.  Invalid input(s): APPERANCEUR    Imaging: No results found.   Medications:     . albumin human  25 g Intravenous Daily  . carvedilol  3.125 mg Oral BID  . epoetin (EPOGEN/PROCRIT) injection  4,000 Units Intravenous Q T,Th,Sa-HD  . furosemide  80 mg Oral Daily  . heparin  5,000 Units Subcutaneous 3 times per day  . insulin aspart  0-5 Units Subcutaneous QHS  . insulin aspart  0-9 Units Subcutaneous TID WC  . insulin detemir  36 Units Subcutaneous QHS  . loratadine  10 mg Oral Daily  . piperacillin-tazobactam (ZOSYN)  IV  3.375 g Intravenous Q12H  . vancomycin  1,000 mg Intravenous Q M,W,F-HD   sodium chloride, sodium chloride, acetaminophen, alteplase,  heparin, lidocaine (PF), lidocaine-prilocaine, morphine injection, ondansetron (ZOFRAN) IV, oxyCODONE, pentafluoroprop-tetrafluoroeth  Assessment/ Plan:  59 y.o. female 59 year old Caucasian female with past medical history of hypertension, diabetes mellitus, chronic right lower extremity wound, history of decubitus ulcer, morbid obesity, ESRD on HD MWF, anemia of CKD, SHPTH  1. End-stage renal disease on hemodialysis Monday, Wednesday, Friday.  Pt has had HD the past two days, had infiltration of access yesterday, therefore will hold off of dialysis today.  Ok for pt to discharge today, she will have HD tomorrow as outpt given Thanksgiving schedule.  2. Anemia of chronic kidney disease. Will resume epogen as outpt.  3. Secondary hyperparathyroidism. Phos 4.9 and PTH 218, both acceptable.   4. Hypertension. BP 103/58, continue losartan/metoprolol.   5. Bilateral lower extremity wounds. Albumin low at 2.8 and contributing to edema.  Pt to start IDPN as outpt.    LOS: 3 Dae Highley 11/19/201611:28 AM

## 2015-06-03 NOTE — Discharge Summary (Signed)
[facility] [place] [facility]  DISCHARGE SUMMARY   PATIENT NAME: [name]    MR#:  [id number]  DATE OF BIRTH:  1956/03/11  DATE OF ADMISSION:  05/31/2015 ADMITTING PHYSICIAN: Altamese Dilling, MD  DATE OF DISCHARGE: 06/03/2015  PRIMARY CARE PHYSICIAN: Fidel Levy, MD    ADMISSION DIAGNOSIS:  Cellulitis of lower extremity, unspecified laterality [L03.119]  DISCHARGE DIAGNOSIS:  Principal Problem:   Cellulitis Active Problems:   Diabetic leg ulcer (HCC)   SECONDARY DIAGNOSIS:   Past Medical History  Diagnosis Date  . Chronic kidney disease   . Hypertension   . Diabetes mellitus without complication [facility])     HOSPITAL COURSE:   1. Cellulitis of bilateral lower extremities and diabetic ulcers. She was treated with vancomycin and Zosyn during this admission. Cultures with Klebsiella, Citrobacter and Staphylococcus. The first 2 are sensitive to Levaquin, staphylococcal sensitivities are still pending. Likely sensitive. Will start her on Levaquin. The edema is likely driving these chronic ulcers. She has had Unna boots applied. She will need home health nursing for wound care. And she will need to follow up in wound care clinic. Wound care consultation instructions are below. She was seen by surgical service and no current indication for debridement. Case manager has worked to get the patient a hospital bed in her home with a gel mattress overlay. She currently sleeps in a chair and hopefully lying flat will assist with management of edema and healing of these ulcers.  Dressing procedure/placement/frequency:Cleanse bilateral legs with soap and water and pat gently dry. Apply calcium alginate to wound beds. Cover wound to right posterior leg with ABD pad. Wrap both legs with dry kerlix wrap, secured with Coban wrap. ([employer] boot). Will switch over to Unnas boots (zinc layer) once it is available. Change Mon/Wed/Fri.  2. Low  albumin/anasarca- patient receiving albumin infusions with dialysis to improve blood pressure and decrease edema.  3. End-stage renal disease- she will continue on dialysis as previously scheduled. Due to the holiday she will actually have dialysis tomorrow.  4. Essential hypertension- blood pressure on the lower side with Coreg and Lasix  5. Type 2 diabetes mellitus- patient is on detemir insulin and sliding scale. Will discharge on home regimen without any changes made.  She is very anxious for discharge today. She wants to go home on her son is here to help her get set up with a new hospital bed. She is also interested in traveling this week to [name]. I have agreed to discharge today but have discouraged her from traveling. I think prolonged sitting in the car would likely not help the edema and wounds.  DISCHARGE CONDITIONS:   [place]  CONSULTS OBTAINED:  Treatment Team:  Mady Haagensen, MD Renford Dills, MD  DRUG ALLERGIES:   Allergies  Allergen Reactions  . Codeine Nausea Only    hydrocodone  . Vicodin [Hydrocodone-Acetaminophen] Nausea And Vomiting    DISCHARGE MEDICATIONS:   Current Discharge Medication List    START taking these medications   Details  levofloxacin (LEVAQUIN) 500 MG tablet Take 1 tablet (500 mg total) by mouth every other day. Take 1 tablet every other day after dialysis treatment. Qty: 6 tablet, Refills: 0      CONTINUE these medications which have NOT CHANGED   Details  !! carvedilol (COREG) 3.125 MG tablet TAKE ONE TABLET BY MOUTH TWICE DAILY Qty: 60 tablet, Refills: 6    !! carvedilol (COREG) 3.125 MG tablet Take 3.125 mg by  mouth every evening. On Monday, Wednesday, and Friday    furosemide (LASIX) 80 MG tablet Take 80 mg by mouth daily. On Tuesday, Thursday, Saturday, and Sunday    Insulin Detemir (LEVEMIR FLEXTOUCH) 100 UNIT/ML Pen Inject 36 Units into the skin daily at 10 pm. Qty: 9 pen, Refills: 12   Associated Diagnoses: Type 2  diabetes mellitus with chronic kidney disease on chronic dialysis, with long-term current use of insulin (HCC)    Lancets (FREESTYLE) lancets USE TO CHECK BLOOD SUGAR 4 TIMES DAILY Qty: 200 each, Refills: 12    lidocaine-prilocaine (EMLA) cream Apply 1 application topically 3 (three) times a week. On Monday, Wednesday, and Friday, before dialysis    loratadine (CLARITIN) 10 MG tablet Take 10 mg by mouth daily.    losartan (COZAAR) 50 MG tablet TAKE ONE TABLET BY MOUTH ONCE DAILY Qty: 30 tablet, Refills: 12    SANTYL ointment Apply 1 application topically 3 (three) times daily.     !! - Potential duplicate medications found. Please discuss with provider.       DISCHARGE INSTRUCTIONS:    Diabetic, renal, cardiac diet. She will have home health nursing for wound care and skilled management. She has a home hospital bed with gel mattress overlay. Activity is as tolerated. Continue with dialysis.  If you experience worsening of your admission symptoms, develop shortness of breath, life threatening emergency, suicidal or homicidal thoughts you must seek medical attention immediately by calling 911 or calling your MD immediately  if symptoms less severe.  You Must read complete instructions/literature along with all the possible adverse reactions/side effects for all the Medicines you take and that have been prescribed to you. Take any new Medicines after you have completely understood and accept all the possible adverse reactions/side effects.   Please note  You were cared for by a hospitalist during your hospital stay. If you have any questions about your discharge medications or the care you received while you were in the hospital after you are discharged, you can call the unit and asked to speak with the hospitalist on call if the hospitalist that took care of you is not available. Once you are discharged, your primary care physician will handle any further medical issues. Please note that  NO REFILLS for any discharge medications will be authorized once you are discharged, as it is imperative that you return to your primary care physician (or establish a relationship with a primary care physician if you do not have one) for your aftercare needs so that they can reassess your need for medications and monitor your lab values.    Today   CHIEF COMPLAINT:   Chief Complaint  Patient presents with  . Wound Check    HISTORY OF PRESENT ILLNESS:  [name] is a 59 y.o. female with a known history of diabetes, hypertension, end-stage renal disease on hemodialysis- has leg ulcers and infections for last 5-6 months has been following with wound care center and she has home health and visiting nurse coming at home for regular dressing changes. Last time she received antibiotic was a month ago for that. She went to visit the wound care center today and by looking at her want to have as it looked infected and in need of the debritement- the Center to emergency room and ER physician after speaking to vascular surgery called for admission. Patient denies any fever or pain or any other complaints.  VITAL SIGNS:  Blood pressure 103/58, pulse 85, temperature 98.4 F (  36.9 C), temperature source Oral, resp. rate 18, height 5\' 6"  (1.676 m), weight 120.022 kg (264 lb 9.6 oz), SpO2 95 %.  I/O:    Intake/Output Summary (Last 24 hours) at 06/03/15 1250 Last data filed at 06/03/15 0235  Gross per 24 hour  Intake      0 ml  Output    363 ml  Net   -363 ml    PHYSICAL EXAMINATION:  GENERAL:  59 y.o.-year-old patient sitting up in chair. No distress LUNGS: Normal breath sounds bilaterally, no wheezing, rales,rhonchi or crepitation. No use of accessory muscles of respiration.  CARDIOVASCULAR: S1, S2 normal. No murmurs, rubs, or gallops.  ABDOMEN: Soft, non-tender, non-distended. Bowel sounds present. No organomegaly or mass.  EXTREMITIES: Both legs wrapped in Unna boots., No cyanosis, or  clubbing.  NEUROLOGIC: Cranial nerves II through XII are intact. Muscle strength 5/5 in all extremities. Sensation intact. Gait not checked.  PSYCHIATRIC: The patient is alert and oriented x 3.  SKIN: No obvious rash, lesion, or ulcer.   DATA REVIEW:   CBC  Recent Labs Lab 06/02/15 1356  WBC 9.4  HGB 10.2*  HCT 31.2*  PLT 357    Chemistries   Recent Labs Lab 05/31/15 0959  06/02/15 1356  NA 138  < > 137  K 3.4*  < > 3.4*  CL 104  < > 100*  CO2 26  < > 29  GLUCOSE 151*  < > 164*  BUN 22*  < > 17  CREATININE 3.47*  < > 3.00*  CALCIUM 8.4*  < > 8.2*  AST 14*  --   --   ALT 11*  --   --   ALKPHOS 123  --   --   BILITOT 0.5  --   --   < > = values in this interval not displayed.  Cardiac Enzymes No results for input(s): TROPONINI in the last 168 hours.  Microbiology Results  Results for orders placed or performed during the hospital encounter of 05/31/15  Wound culture     Status: None (Preliminary result)   Collection Time: 05/31/15  8:42 AM  Result Value Ref Range Status   Specimen Description WOUND  Final   Special Requests NONE  Final   Gram Stain   Final    FEW WBC SEEN FEW GRAM NEGATIVE RODS FEW GRAM POSITIVE COCCI IN PAIRS    Culture   Final    HEAVY GROWTH KLEBSIELLA OXYTOCA HEAVY GROWTH CITROBACTER FREUNDII MODERATE GROWTH STAPHYLOCOCCUS AUREUS SUSCEPTIBILITIES TO FOLLOW FOR ORGANISM 3    Report Status PENDING  Incomplete   Organism ID, Bacteria KLEBSIELLA OXYTOCA  Final   Organism ID, Bacteria CITROBACTER FREUNDII  Final      Susceptibility   Citrobacter freundii - MIC*    CEFTAZIDIME <=1 SENSITIVE Sensitive     CEFAZOLIN >=64 RESISTANT Resistant     CEFTRIAXONE <=1 SENSITIVE Sensitive     CIPROFLOXACIN <=0.25 SENSITIVE Sensitive     GENTAMICIN <=1 SENSITIVE Sensitive     IMIPENEM <=0.25 SENSITIVE Sensitive     TRIMETH/SULFA <=20 SENSITIVE Sensitive     NITROFURANTOIN Value in next row Sensitive      SENSITIVE<=16    PIP/TAZO Value in next  row Sensitive      SENSITIVE<=4    LEVOFLOXACIN Value in next row Sensitive      SENSITIVE<=0.12    * HEAVY GROWTH CITROBACTER FREUNDII   Klebsiella oxytoca - MIC*    AMPICILLIN Value in next row Resistant  SENSITIVE<=0.12    CEFTAZIDIME Value in next row Sensitive      SENSITIVE<=0.12    CEFAZOLIN Value in next row Sensitive      SENSITIVE<=0.12    CEFTRIAXONE Value in next row Sensitive      SENSITIVE<=0.12    CIPROFLOXACIN Value in next row Sensitive      SENSITIVE<=0.12    GENTAMICIN Value in next row Sensitive      SENSITIVE<=0.12    IMIPENEM Value in next row Sensitive      SENSITIVE<=0.12    TRIMETH/SULFA Value in next row Sensitive      SENSITIVE<=0.12    NITROFURANTOIN Value in next row Sensitive      SENSITIVE<=16    PIP/TAZO Value in next row Sensitive      SENSITIVE<=4    AMPICILLIN/SULBACTAM Value in next row Intermediate      INTERMEDIATE16    LEVOFLOXACIN Value in next row Sensitive      SENSITIVE<=0.12    * HEAVY GROWTH KLEBSIELLA OXYTOCA    RADIOLOGY:  No results found.  EKG:   Orders placed or performed during the hospital encounter of 11/21/14  . EKG 12-Lead  . EKG 12-Lead      Management plans discussed with the patient, family and they are in agreement.  CODE STATUS:     Code Status Orders        Start     Ordered   05/31/15 1613  Full code   Continuous     05/31/15 1612    Advance Directive Documentation        Most Recent Value   Type of Advance Directive  Healthcare Power of Attorney, Living will   Pre-existing out of facility DNR order (yellow form or pink MOST form)     "MOST" Form in Place?        TOTAL TIME TAKING CARE OF THIS PATIENT: 35 minutes.  Greater than 50% of time spent in care coordination and counseling.  Elby Showers M.D on 06/03/2015 at 12:50 PM  Between 7am to 6pm - Pager - [phone]  After 6pm go to [email or web address] - password [email or web address] [facility]  [facility]  Office   [phone]  CC: Primary care physician; Fidel Levy, MD

## 2015-06-04 LAB — WOUND CULTURE

## 2015-06-05 ENCOUNTER — Telehealth: Payer: Self-pay | Admitting: Family Medicine

## 2015-06-05 DIAGNOSIS — N189 Chronic kidney disease, unspecified: Secondary | ICD-10-CM

## 2015-06-05 DIAGNOSIS — E119 Type 2 diabetes mellitus without complications: Secondary | ICD-10-CM

## 2015-06-05 NOTE — Telephone Encounter (Signed)
If she would like to be referred to Dr. Renae FicklePaul, I would be glad to refer her.  It is up to her.-jh

## 2015-06-05 NOTE — Telephone Encounter (Signed)
Received a call from  Pt. Friend states that pt need a referral to   Endocrinology   [facility]

## 2015-06-06 DIAGNOSIS — N186 End stage renal disease: Secondary | ICD-10-CM | POA: Diagnosis not present

## 2015-06-06 DIAGNOSIS — D509 Iron deficiency anemia, unspecified: Secondary | ICD-10-CM | POA: Diagnosis not present

## 2015-06-06 DIAGNOSIS — N2581 Secondary hyperparathyroidism of renal origin: Secondary | ICD-10-CM | POA: Diagnosis not present

## 2015-06-06 DIAGNOSIS — Z992 Dependence on renal dialysis: Secondary | ICD-10-CM | POA: Diagnosis not present

## 2015-06-06 DIAGNOSIS — D631 Anemia in chronic kidney disease: Secondary | ICD-10-CM | POA: Diagnosis not present

## 2015-06-06 NOTE — Telephone Encounter (Signed)
referrral entered.[place]

## 2015-06-07 ENCOUNTER — Encounter: Payer: BC Managed Care – PPO | Admitting: Surgery

## 2015-06-07 DIAGNOSIS — E11622 Type 2 diabetes mellitus with other skin ulcer: Secondary | ICD-10-CM | POA: Diagnosis not present

## 2015-06-07 DIAGNOSIS — L03115 Cellulitis of right lower limb: Secondary | ICD-10-CM | POA: Diagnosis not present

## 2015-06-07 DIAGNOSIS — L97222 Non-pressure chronic ulcer of left calf with fat layer exposed: Secondary | ICD-10-CM | POA: Diagnosis not present

## 2015-06-07 DIAGNOSIS — R600 Localized edema: Secondary | ICD-10-CM | POA: Diagnosis not present

## 2015-06-07 DIAGNOSIS — N186 End stage renal disease: Secondary | ICD-10-CM | POA: Diagnosis not present

## 2015-06-07 DIAGNOSIS — L89152 Pressure ulcer of sacral region, stage 2: Secondary | ICD-10-CM | POA: Diagnosis not present

## 2015-06-07 DIAGNOSIS — Z992 Dependence on renal dialysis: Secondary | ICD-10-CM | POA: Diagnosis not present

## 2015-06-07 DIAGNOSIS — L97212 Non-pressure chronic ulcer of right calf with fat layer exposed: Secondary | ICD-10-CM | POA: Diagnosis not present

## 2015-06-07 DIAGNOSIS — L03116 Cellulitis of left lower limb: Secondary | ICD-10-CM | POA: Diagnosis not present

## 2015-06-08 NOTE — Progress Notes (Signed)
CARYN, GIENGER ([id number]) Visit Report for 06/07/2015 Chief Complaint Document Details Patient Name: [name], [name]. Date of Service: 06/07/2015 8:15 AM Medical Record Patient Account Number: [id number] Number: Treating RN: Huel Coventry 1956/01/09 (58 y.o. Other Clinician: Date of Birth/Sex: Female) Treating BURNS III, Primary Care Physician/Extender: Karn Cassis Physician: Referring Physician: Jones Broom in Treatment: 18 Information Obtained from: Patient Chief Complaint Bilateral calf ulcers. Electronic Signature(s) Signed: 06/07/2015 3:24:44 PM By: Madelaine Bhat MD Entered By: Madelaine Bhat on 06/07/2015 09:12:14 Bunney, [name] ([id number]) -------------------------------------------------------------------------------- Debridement Details Patient Name: Bartol, Tenley H. Date of Service: 06/07/2015 8:15 AM Medical Record Patient Account Number: [id number] Number: Treating RN: Huel Coventry 07-Jul-1956 (58 y.o. Other Clinician: Date of Birth/Sex: Female) Treating BURNS III, Primary Care Physician/Extender: Karn Cassis Physician: Referring Physician: Jones Broom in Treatment: 18 Debridement Performed for Wound #3 Left,Lateral Lower Leg Assessment: Performed By: Physician BURNS III, Melanie Crazier., MD Debridement: Debridement Pre-procedure Yes Verification/Time Out Taken: Start Time: 09:00 Pain Control: Other : lidocaine 4% Level: Skin/Subcutaneous Tissue Total Area Debrided (L x 10 (cm) x 5 (cm) = 50 (cm) W): Tissue and other Viable, Non-Viable, Eschar, Exudate, Fat, Fibrin/Slough, Subcutaneous material debrided: Instrument: Curette Bleeding: Moderate Hemostasis Achieved: Silver Nitrate End Time: 09:05 Procedural Pain: 1 Post Procedural Pain: 2 Response to Treatment: Procedure was tolerated well Post Debridement Measurements of Total Wound Length: (cm) 23 Width: (cm)  9 Depth: (cm) 1 Volume: (cm) 162.577 Post Procedure Diagnosis Same as Pre-procedure Electronic Signature(s) Signed: 06/07/2015 3:24:44 PM By: Madelaine Bhat MD Signed: 06/07/2015 3:40:20 PM By: Elliot Gurney, RN, BSN, Kim RN, BSN Entered By: Madelaine Bhat on 06/07/2015 09:11:50 Gaetano, [name] ([id number]) Lafortune, Jennica H. ([id number]) -------------------------------------------------------------------------------- Debridement Details Patient Name: Wyche, Celestine H. Date of Service: 06/07/2015 8:15 AM Medical Record Patient Account Number: [id number] Number: Treating RN: Huel Coventry [birth year] (58 y.o. Other Clinician: Date of Birth/Sex: Female) Treating BURNS III, Primary Care Physician/Extender: Karn Cassis Physician: Referring Physician: Jones Broom in Treatment: 18 Debridement Performed for Wound #8 Right,Posterior Lower Leg Assessment: Performed By: Physician BURNS III, Melanie Crazier., MD Debridement: Debridement Pre-procedure Yes Verification/Time Out Taken: Start Time: 09:00 Pain Control: Other : lidocaine 4% Level: Skin/Subcutaneous Tissue Total Area Debrided (L x 5.5 (cm) x 4 (cm) = 22 (cm) W): Tissue and other Viable, Non-Viable, Eschar, Exudate, Fat, Fibrin/Slough, Subcutaneous material debrided: Instrument: Curette Bleeding: Moderate Hemostasis Achieved: Silver Nitrate End Time: 09:05 Procedural Pain: 1 Post Procedural Pain: 2 Response to Treatment: Procedure was tolerated well Post Debridement Measurements of Total Wound Length: (cm) 5.5 Width: (cm) 4 Depth: (cm) 0.3 Volume: (cm) 5.184 Post Procedure Diagnosis Same as Pre-procedure Electronic Signature(s) Signed: 06/07/2015 3:24:44 PM By: Madelaine Bhat MD Signed: 06/07/2015 3:40:20 PM By: Elliot Gurney, RN, BSN, Kim RN, BSN Entered By: Madelaine Bhat on 06/07/2015 09:12:04 Guastella, [name] ([id number]) [name], Tjuana H.  ([id number]) -------------------------------------------------------------------------------- HPI Details Patient Name: Kryder, Lucindia H. Date of Service: 06/07/2015 8:15 AM Medical Record Patient Account Number: [id number] Number: Treating RN: Huel Coventry 1956/03/17 (58 y.o. Other Clinician: Date of Birth/Sex: Female) Treating BURNS III, Primary Care Physician/Extender: Karn Cassis Physician: Referring Physician: Jones Broom in Treatment: 18 History of Present Illness HPI Description: 59 year old with h/o DM (Hgb A1c 7.4 in Aug 2016), ESRD (on hemodialysis since July 2016). Presented to her PCP Dr. Venora Maples for BLE ulcers since June 2016. She was taken to  the OR on 03/13/2015 for a angioplasty of her left arm AV fistula. No lower extremity debridement or biopsies. 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 No record of venous US to assess for venous insufficiency. Patient has declined [place]. Culture 04/19/2015 grew methicillin sensitive staph aureus (sensitive to tetracycline), Stenotrophomonas maltophilia, and Enterococcus faecalis. Completed doxycycline. Recently hospitalized for worsening cellulitis. Treated with IV antibiotics. No operative debridement or biopsy. Performing dressing changes with silver alginate. Tolerating Profore light bilaterally. Declined operative debridement and punch biopsy. She returns to clinic and reports that her legs feel better. She has developed a stage II sacral pressure ulcer. Has a hospital bed now. No fever or chills. Less drainage. Electronic Signature(s) Signed: 06/07/2015 3:24:44 PM By: Madelaine Bhat MD Entered By: Madelaine Bhat on 06/07/2015 09:14:03 Weich, [name] ([id number]) -------------------------------------------------------------------------------- Physical Exam  Details Patient Name: Read, Duru H. Date of Service: 06/07/2015 8:15 AM Medical Record Patient Account Number: [id number] Number: Treating RN: Huel Coventry 1956/06/21 (58 y.o. Other Clinician: Date of Birth/Sex: Female) Treating BURNS III, Primary Care Physician/Extender: Karn Cassis Physician: Referring Physician: Fidel Levy Weeks in Treatment: 18 Constitutional . Pulse regular. Respirations normal and unlabored. Afebrile. Marland Kitchen Respiratory WNL. No retractions.. Cardiovascular Pedal Pulses WNL. Integumentary (Hair, Skin) .Marland Kitchen Neurological Sensation normal to touch, pin,and vibration. Psychiatric Judgement and insight Intact.. Oriented times 3.. No evidence of depression, anxiety, or agitation.. Notes Bilateral calf ulcerations improved since hospitalization. No significant cellulitis. Less necrotic tissue. Persistent 2+ pitting edema. Faintly palpable DP bilaterally. Triphasic on the right. Biphasic on the left. Noncompressible. Sacral pressure ulcer. Partial-thickness. No evidence for infection. Electronic Signature(s) Signed: 06/07/2015 3:24:44 PM By: Madelaine Bhat MD Entered By: Madelaine Bhat on 06/07/2015 09:15:20 Beckel, [name] ([id number]) -------------------------------------------------------------------------------- Physician Orders Details Patient Name: Caine, Zainah H. Date of Service: 06/07/2015 8:15 AM Medical Record Patient Account Number: [id number] Number: Treating RN: Phillis Haggis 11/09/1955 (58 y.o. Other Clinician: Date of Birth/Sex: Female) Treating BURNS III, Primary Care Physician/Extender: Karn Cassis Physician: Referring Physician: Jones Broom in Treatment: 64 Verbal / Phone Orders: Yes Clinician: Ashok Cordia, Debi Read Back and Verified: Yes Diagnosis Coding Wound Cleansing Wound #1 Right,Proximal Lower Leg o Clean wound with Normal Saline. o Cleanse  wound with mild soap and water o May Shower, gently pat wound dry prior to applying new dressing. Wound #10 Left Gluteus o Clean wound with Normal Saline. o Cleanse wound with mild soap and water o May Shower, gently pat wound dry prior to applying new dressing. Wound #11 Right Gluteus o Clean wound with Normal Saline. o Cleanse wound with mild soap and water o May Shower, gently pat wound dry prior to applying new dressing. Wound #2 Right,Distal Lower Leg o Clean wound with Normal Saline. o Cleanse wound with mild soap and water o May Shower, gently pat wound dry prior to applying new dressing. Wound #3 Left,Lateral Lower Leg o Clean wound with Normal Saline. o Cleanse wound with mild soap and water o May Shower, gently pat wound dry prior to applying new dressing. Wound #8 Right,Posterior Lower Leg o Clean wound with Normal Saline. o Cleanse wound with mild soap and water o May Shower, gently pat wound dry prior to applying new dressing. Wound #9 Left,Posterior Lower Leg o Clean wound with Normal Saline. o Cleanse wound with mild soap and water o May Shower, gently pat  wound dry prior to applying new dressing. CAYTLYN, EVERS ([id number]) Anesthetic Wound #1 Right,Proximal Lower Leg o Topical Lidocaine 4% cream applied to wound bed prior to debridement Wound #10 Left Gluteus o Topical Lidocaine 4% cream applied to wound bed prior to debridement Wound #11 Right Gluteus o Topical Lidocaine 4% cream applied to wound bed prior to debridement Wound #2 Right,Distal Lower Leg o Topical Lidocaine 4% cream applied to wound bed prior to debridement Wound #3 Left,Lateral Lower Leg o Topical Lidocaine 4% cream applied to wound bed prior to debridement Wound #8 Right,Posterior Lower Leg o Topical Lidocaine 4% cream applied to wound bed prior to debridement Wound #9 Left,Posterior Lower Leg o Topical Lidocaine 4% cream applied to  wound bed prior to debridement Skin Barriers/Peri-Wound Care Wound #10 Left Gluteus o Barrier cream Wound #11 Right Gluteus o Barrier cream Primary Wound Dressing Wound #1 Right,Proximal Lower Leg o Aquacel Ag - Or Equivalent Wound #2 Right,Distal Lower Leg o Aquacel Ag - Or Equivalent Wound #3 Left,Lateral Lower Leg o Aquacel Ag - Or Equivalent Wound #8 Right,Posterior Lower Leg o Aquacel Ag - Or Equivalent Wound #9 Left,Posterior Lower Leg o Aquacel Ag - Or Equivalent Secondary Dressing Wound #1 Right,Proximal Lower Leg Foronda, Gracynn H. ([id number]) o XtraSorb - Maxisorb or Equivalent for absorption Wound #10 Left Gluteus o XtraSorb - Maxisorb or Equivalent for absorption Wound #11 Right Gluteus o XtraSorb - Maxisorb or Equivalent for absorption Wound #2 Right,Distal Lower Leg o XtraSorb - Maxisorb or Equivalent for absorption Wound #3 Left,Lateral Lower Leg o XtraSorb - Maxisorb or Equivalent for absorption Wound #8 Right,Posterior Lower Leg o XtraSorb - Maxisorb or Equivalent for absorption Wound #9 Left,Posterior Lower Leg o XtraSorb - Maxisorb or Equivalent for absorption Dressing Change Frequency Wound #1 Right,Proximal Lower Leg o Change Dressing Monday, Wednesday, Friday - Wednesday in wound care center Wound #10 Left Gluteus o Change Dressing Monday, Wednesday, Friday - Wednesday in wound care center Wound #11 Right Gluteus o Change Dressing Monday, Wednesday, Friday - Wednesday in wound care center Wound #2 Right,Distal Lower Leg o Change Dressing Monday, Wednesday, Friday - Wednesday in wound care center Wound #3 Left,Lateral Lower Leg o Change Dressing Monday, Wednesday, Friday - Wednesday in wound care center Wound #8 Right,Posterior Lower Leg o Change Dressing Monday, Wednesday, Friday - Wednesday in wound care center Wound #9 Left,Posterior Lower Leg o Change Dressing Monday, Wednesday, Friday - Wednesday in  wound care center Follow-up Appointments Wound #1 Right,Proximal Lower Leg o Return Appointment in 1 week. Wound #10 Left Gluteus o Return Appointment in 1 week. Metzger, Keyana H. ([id number]) Wound #11 Right Gluteus o Return Appointment in 1 week. Wound #2 Right,Distal Lower Leg o Return Appointment in 1 week. Wound #3 Left,Lateral Lower Leg o Return Appointment in 1 week. Wound #8 Right,Posterior Lower Leg o Return Appointment in 1 week. Wound #9 Left,Posterior Lower Leg o Return Appointment in 1 week. Edema Control Wound #1 Right,Proximal Lower Leg o 3 Layer Compression System - Bilateral - Profore lite o Elevate legs to the level of the heart and pump ankles as often as possible - Elevate legs at Dialysis Wound #2 Right,Distal Lower Leg o 3 Layer Compression System - Bilateral - Profore lite o Elevate legs to the level of the heart and pump ankles as often as possible - Elevate legs at Dialysis Wound #3 Left,Lateral Lower Leg o 3 Layer Compression System - Bilateral - Profore lite o Elevate legs to the level of the heart  and pump ankles as often as possible - Elevate legs at Dialysis Wound #8 Right,Posterior Lower Leg o 3 Layer Compression System - Bilateral - Profore lite o Elevate legs to the level of the heart and pump ankles as often as possible - Elevate legs at Dialysis Wound #9 Left,Posterior Lower Leg o 3 Layer Compression System - Bilateral - Profore lite o Elevate legs to the level of the heart and pump ankles as often as possible - Elevate legs at Dialysis Off-Loading Wound #10 Left Gluteus o Turn and reposition every 2 hours Wound #11 Right Gluteus o Turn and reposition every 2 hours Lerch, Ryenne H. ([id number]) Home Health Wound #1 Right,Proximal Lower Leg o Continue Home Health Visits - Advanced: Monday, Wednesday and Friday **WRAP GOES FROM BASE OF TOES TO THREE FINGER WIDTHS BENEATH BEND IN KNEE. o Home  Health Nurse may visit PRN to address patientos wound care needs. o FACE TO FACE ENCOUNTER: MEDICARE and MEDICAID PATIENTS: I certify that this patient is under my care and that I had a face-to-face encounter that meets the physician face-to-face encounter requirements with this patient on this date. The encounter with the patient was in whole or in part for the following MEDICAL CONDITION: (primary reason for Home Healthcare) MEDICAL NECESSITY: I certify, that based on my findings, NURSING services are a medically necessary home health service. HOME BOUND STATUS: I certify that my clinical findings support that this patient is homebound (i.e., Due to illness or injury, pt requires aid of supportive devices such as crutches, cane, wheelchairs, walkers, the use of special transportation or the assistance of another person to leave their place of residence. There is a normal inability to leave the home and doing so requires considerable and taxing effort. Other absences are for medical reasons / religious services and are infrequent or of short duration when for other reasons). o If current dressing causes regression in wound condition, may D/C ordered dressing product/s and apply Normal Saline Moist Dressing daily until next Wound Healing Center / Other MD appointment. Notify Wound Healing Center of regression in wound condition at [phone]. o Please direct any NON-WOUND related issues/requests for orders to patient's Primary Care Physician Wound #10 Left Gluteus o Continue Home Health Visits - Advanced: Monday, Wednesday and Friday **WRAP GOES FROM BASE OF TOES TO THREE FINGER WIDTHS BENEATH BEND IN KNEE. o Home Health Nurse may visit PRN to address patientos wound care needs. o FACE TO FACE ENCOUNTER: MEDICARE and MEDICAID PATIENTS: I certify that this patient is under my care and that I had a face-to-face encounter that meets the physician face-to-face encounter requirements  with this patient on this date. The encounter with the patient was in whole or in part for the following MEDICAL CONDITION: (primary reason for Home Healthcare) MEDICAL NECESSITY: I certify, that based on my findings, NURSING services are a medically necessary home health service. HOME BOUND STATUS: I certify that my clinical findings support that this patient is homebound (i.e., Due to illness or injury, pt requires aid of supportive devices such as crutches, cane, wheelchairs, walkers, the use of special transportation or the assistance of another person to leave their place of residence. There is a normal inability to leave the home and doing so requires considerable and taxing effort. Other absences are for medical reasons / religious services and are infrequent or of short duration when for other reasons). o If current dressing causes regression in wound condition, may D/C ordered dressing product/s and apply Normal  Saline Moist Dressing daily until next Wound Healing Center / Other MD appointment. Notify Wound Healing Center of regression in wound condition at [phone]. o Please direct any NON-WOUND related issues/requests for orders to patient's Primary Care Physician Wound #11 Right Gluteus o Continue Home Health Visits - Advanced: Monday, Wednesday and Friday **WRAP GOES FROM BASE OF TOES TO THREE FINGER WIDTHS BENEATH BEND IN KNEE. o Home Health Nurse may visit PRN to address patientos wound care needs. ROSELEE, TAYLOE ([id number]) o FACE TO FACE ENCOUNTER: MEDICARE and MEDICAID PATIENTS: I certify that this patient is under my care and that I had a face-to-face encounter that meets the physician face-to-face encounter requirements with this patient on this date. The encounter with the patient was in whole or in part for the following MEDICAL CONDITION: (primary reason for Home Healthcare) MEDICAL NECESSITY: I certify, that based on my findings, NURSING services are  a medically necessary home health service. HOME BOUND STATUS: I certify that my clinical findings support that this patient is homebound (i.e., Due to illness or injury, pt requires aid of supportive devices such as crutches, cane, wheelchairs, walkers, the use of special transportation or the assistance of another person to leave their place of residence. There is a normal inability to leave the home and doing so requires considerable and taxing effort. Other absences are for medical reasons / religious services and are infrequent or of short duration when for other reasons). o If current dressing causes regression in wound condition, may D/C ordered dressing product/s and apply Normal Saline Moist Dressing daily until next Wound Healing Center / Other MD appointment. Notify Wound Healing Center of regression in wound condition at [phone]. o Please direct any NON-WOUND related issues/requests for orders to patient's Primary Care Physician Wound #2 Right,Distal Lower Leg o Continue Home Health Visits - Advanced: Monday, Wednesday and Friday **WRAP GOES FROM BASE OF TOES TO THREE FINGER WIDTHS BENEATH BEND IN KNEE. o Home Health Nurse may visit PRN to address patientos wound care needs. o FACE TO FACE ENCOUNTER: MEDICARE and MEDICAID PATIENTS: I certify that this patient is under my care and that I had a face-to-face encounter that meets the physician face-to-face encounter requirements with this patient on this date. The encounter with the patient was in whole or in part for the following MEDICAL CONDITION: (primary reason for Home Healthcare) MEDICAL NECESSITY: I certify, that based on my findings, NURSING services are a medically necessary home health service. HOME BOUND STATUS: I certify that my clinical findings support that this patient is homebound (i.e., Due to illness or injury, pt requires aid of supportive devices such as crutches, cane, wheelchairs, walkers, the use  of special transportation or the assistance of another person to leave their place of residence. There is a normal inability to leave the home and doing so requires considerable and taxing effort. Other absences are for medical reasons / religious services and are infrequent or of short duration when for other reasons). o If current dressing causes regression in wound condition, may D/C ordered dressing product/s and apply Normal Saline Moist Dressing daily until next Wound Healing Center / Other MD appointment. Notify Wound Healing Center of regression in wound condition at [phone]. o Please direct any NON-WOUND related issues/requests for orders to patient's Primary Care Physician Wound #3 Left,Lateral Lower Leg o Continue Home Health Visits - Advanced: Monday, Wednesday and Friday **WRAP GOES FROM BASE OF TOES TO THREE FINGER WIDTHS BENEATH BEND IN KNEE. o Home Health  Nurse may visit PRN to address patientos wound care needs. o FACE TO FACE ENCOUNTER: MEDICARE and MEDICAID PATIENTS: I certify that this patient is under my care and that I had a face-to-face encounter that meets the physician face-to-face encounter requirements with this patient on this date. The encounter with the patient was in whole or in part for the following MEDICAL CONDITION: (primary reason for Home Healthcare) MEDICAL NECESSITY: I certify, that based on my findings, NURSING services are a medically necessary home health service. HOME BOUND STATUS: I certify that my clinical findings Rod, Cathline H. ([id number]) support that this patient is homebound (i.e., Due to illness or injury, pt requires aid of supportive devices such as crutches, cane, wheelchairs, walkers, the use of special transportation or the assistance of another person to leave their place of residence. There is a normal inability to leave the home and doing so requires considerable and taxing effort. Other absences are for medical  reasons / religious services and are infrequent or of short duration when for other reasons). o If current dressing causes regression in wound condition, may D/C ordered dressing product/s and apply Normal Saline Moist Dressing daily until next Wound Healing Center / Other MD appointment. Notify Wound Healing Center of regression in wound condition at [phone]. o Please direct any NON-WOUND related issues/requests for orders to patient's Primary Care Physician Wound #8 Right,Posterior Lower Leg o Continue Home Health Visits - Advanced: Monday, Wednesday and Friday **WRAP GOES FROM BASE OF TOES TO THREE FINGER WIDTHS BENEATH BEND IN KNEE. o Home Health Nurse may visit PRN to address patientos wound care needs. o FACE TO FACE ENCOUNTER: MEDICARE and MEDICAID PATIENTS: I certify that this patient is under my care and that I had a face-to-face encounter that meets the physician face-to-face encounter requirements with this patient on this date. The encounter with the patient was in whole or in part for the following MEDICAL CONDITION: (primary reason for Home Healthcare) MEDICAL NECESSITY: I certify, that based on my findings, NURSING services are a medically necessary home health service. HOME BOUND STATUS: I certify that my clinical findings support that this patient is homebound (i.e., Due to illness or injury, pt requires aid of supportive devices such as crutches, cane, wheelchairs, walkers, the use of special transportation or the assistance of another person to leave their place of residence. There is a normal inability to leave the home and doing so requires considerable and taxing effort. Other absences are for medical reasons / religious services and are infrequent or of short duration when for other reasons). o If current dressing causes regression in wound condition, may D/C ordered dressing product/s and apply Normal Saline Moist Dressing daily until next Wound  Healing Center / Other MD appointment. Notify Wound Healing Center of regression in wound condition at [phone]. o Please direct any NON-WOUND related issues/requests for orders to patient's Primary Care Physician Wound #9 Left,Posterior Lower Leg o Continue Home Health Visits - Advanced: Monday, Wednesday and Friday **WRAP GOES FROM BASE OF TOES TO THREE FINGER WIDTHS BENEATH BEND IN KNEE. o Home Health Nurse may visit PRN to address patientos wound care needs. o FACE TO FACE ENCOUNTER: MEDICARE and MEDICAID PATIENTS: I certify that this patient is under my care and that I had a face-to-face encounter that meets the physician face-to-face encounter requirements with this patient on this date. The encounter with the patient was in whole or in part for the following MEDICAL CONDITION: (primary reason for Home Healthcare) MEDICAL  NECESSITY: I certify, that based on my findings, NURSING services are a medically necessary home health service. HOME BOUND STATUS: I certify that my clinical findings support that this patient is homebound (i.e., Due to illness or injury, pt requires aid of supportive devices such as crutches, cane, wheelchairs, walkers, the use of special transportation or the assistance of another person to leave their place of residence. There is a normal inability to leave the home and doing so requires considerable and taxing effort. Other absences are for medical reasons / religious services and are infrequent or of short duration when for other reasons). Karp, Lilit H. ([id number]) o If current dressing causes regression in wound condition, may D/C ordered dressing product/s and apply Normal Saline Moist Dressing daily until next Wound Healing Center / Other MD appointment. Notify Wound Healing Center of regression in wound condition at [phone]. o Please direct any NON-WOUND related issues/requests for orders to patient's Primary  Care Physician Electronic Signature(s) Signed: 06/07/2015 3:24:44 PM By: Madelaine Bhat MD Signed: 06/07/2015 4:01:39 PM By: Alejandro Mulling Entered By: Alejandro Mulling on 06/07/2015 09:35:00 [place]ur, [name] ([id number]) -------------------------------------------------------------------------------- Problem List Details Patient Name: Bartha, Laporsche H. Date of Service: 06/07/2015 8:15 AM Medical Record Patient Account Number: [id number] Number: Treating RN: Huel Coventry Jan 03, 1956 (58 y.o. 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 ([id number]) -------------------------------------------------------------------------------- Progress Note Details Patient Name: Jaffe, Dondra H. Date of Service: 06/07/2015 8:15 AM Medical Record Patient Account Number: [id number] Number: Treating RN: Huel Coventry 04-27-1956 (58 y.o. Other Clinician: Date of  Birth/Sex: Female) Treating BURNS III, Primary Care Physician/Extender: Karn Cassis Physician: Referring Physician: Jones Broom in Treatment: 18 Subjective Chief Complaint Information obtained from Patient Bilateral calf ulcers. History of Present Illness (HPI) 59 year old with h/o DM (Hgb A1c 7.4 in Aug 2016), ESRD (on hemodialysis since July 2016). Presented to her PCP Dr. Venora Maples for BLE ulcers since June 2016. She was taken to the OR on 03/13/2015 for a angioplasty of her left arm AV fistula. No lower extremity debridement or biopsies. 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 No record of venous US to assess for venous insufficiency. Patient has declined [place]. Culture 04/19/2015 grew methicillin sensitive staph aureus (sensitive to tetracycline), Stenotrophomonas maltophilia, and Enterococcus faecalis. Completed doxycycline. Recently hospitalized for worsening cellulitis. Treated with IV antibiotics. No operative debridement or biopsy. Performing dressing changes with silver alginate. Tolerating Profore light bilaterally. Declined operative debridement and punch biopsy. She returns to clinic and reports that her legs feel better. She has developed a stage II sacral pressure ulcer. Has a  hospital bed now. No fever or chills. Less drainage. Objective Constitutional Pulse regular. Respirations normal and unlabored. Afebrile. Vitals Time Taken: 8:20 AM, Height: 68 in, Weight: 294 lbs, BMI: 44.7, Temperature: 98.1 F, Pulse: 74 bpm, Respiratory Rate: 18 breaths/min, Blood Pressure: 138/78 mmHg. Velarde, Hendrix H. ([id number]) Respiratory WNL. No retractions.. Cardiovascular Pedal Pulses WNL. Neurological Sensation normal to touch, pin,and vibration. Psychiatric Judgement and insight Intact.. Oriented times 3.. No evidence of  depression, anxiety, or agitation.. General Notes: Bilateral calf ulcerations improved since hospitalization. No significant cellulitis. Less necrotic tissue. Persistent 2+ pitting edema. Faintly palpable DP bilaterally. Triphasic on the right. Biphasic on the left. Noncompressible. Sacral pressure ulcer. Partial-thickness. No evidence for infection. Integumentary (Hair, Skin) Wound #1 status is Open. Original cause of wound was Gradually Appeared. The wound is located on the Right,Proximal Lower Leg. The wound measures 9cm length x 10cm width x 0.6cm depth; 70.686cm^2 area and 42.412cm^3 volume. The wound is limited to skin breakdown. There is no tunneling or undermining noted. There is a medium amount of serous drainage noted. The wound margin is thickened. There is small (1-33%) pink granulation within the wound bed. There is a large (67-100%) amount of necrotic tissue within the wound bed. The periwound skin appearance exhibited: Scarring, Moist. The periwound skin appearance did not exhibit: Callus, Crepitus, Excoriation, Fluctuance, Friable, Induration, Localized Edema, Rash, Dry/Scaly, Maceration, Atrophie Blanche, Cyanosis, Ecchymosis, Hemosiderin Staining, Mottled, Pallor, Rubor, Erythema. Wound #10 status is Open. Original cause of wound was Gradually Appeared. The wound is located on the Left Gluteus. The wound measures 4cm length x 3cm width x 0.1cm depth; 9.425cm^2 area and 0.942cm^3 volume. The wound margin is flat and intact. Wound #11 status is Open. Original cause of wound was Gradually Appeared. The wound is located on the Right Gluteus. The wound measures 0.5cm length x 2cm width x 0.2cm depth; 0.785cm^2 area and 0.157cm^3 volume. Wound #2 status is Open. Original cause of wound was Gradually Appeared. The wound is located on the Right,Distal Lower Leg. The wound measures 4.5cm length x 12cm width x 0.3cm depth; 42.412cm^2 area and 12.723cm^3 volume. Wound #3 status is Open.  Original cause of wound was Gradually Appeared. The wound is located on the Left,Lateral Lower Leg. The wound measures 23cm length x 9cm width x 0.9cm depth; 162.577cm^2 area and 146.32cm^3 volume. Wound #8 status is Open. Original cause of wound was Gradually Appeared. The wound is located on the Right,Posterior Lower Leg. The wound measures 5.5cm length x 4cm width x 0.3cm depth; 17.279cm^2 area and 5.184cm^3 volume. Hendrickson, Cece H. ([id number]) Wound #9 status is Open. Original cause of wound was Gradually Appeared. The wound is located on the Left,Posterior Lower Leg. The wound measures 4cm length x 3cm width x 0.6cm depth; 9.425cm^2 area and 5.655cm^3 volume. 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 There was a Skin/Subcutaneous Tissue Debridement (78295-62130) debridement with total area of 50 sq cm performed by BURNS III, Melanie Crazier., MD. with the following instrument(s): Curette to remove Viable and Non-Viable tissue/material including Exudate, Fat, Fibrin/Slough, Eschar, and  Subcutaneous after achieving pain control using Other (lidocaine 4%). A time out was conducted prior to the start of the procedure. A Moderate amount of bleeding was controlled with Silver Nitrate. The procedure was tolerated well with a pain level of 1 throughout and a pain level of 2 following the procedure. Post Debridement Measurements: 23cm length x 9cm width x 1cm depth; 162.577cm^3 volume. 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 There was a Skin/Subcutaneous Tissue Debridement ([id number]) debridement with total area of 22 sq cm performed by BURNS III, Melanie Crazier., MD. with the following instrument(s): Curette to remove Viable and Non-Viable tissue/material including Exudate, Fat, Fibrin/Slough, Eschar, and Subcutaneous after achieving pain control using Other (lidocaine 4%). A time out was conducted prior to the start of the procedure. A Moderate amount of bleeding was controlled with Silver Nitrate. The procedure was tolerated well with a pain level of 1 throughout and a Hecht, Annaliz H. ([id number]) pain level of 2 following the procedure. Post Debridement Measurements: 5.5cm length x 4cm width x 0.3cm depth; 5.184cm^3 volume. Post procedure Diagnosis Wound #8: Same as Pre-Procedure Plan Wound Cleansing: Wound #1 Right,Proximal Lower Leg: Clean wound with Normal Saline. Cleanse wound with mild soap and water May Shower, gently pat wound dry prior to applying new dressing. Wound #10 Left Gluteus: Clean wound with Normal Saline. Cleanse wound with mild soap and water May Shower, gently pat wound dry prior to applying new dressing. Wound #11 Right Gluteus: Clean wound with Normal Saline. Cleanse wound with mild soap and water May Shower, gently pat wound dry prior to applying new dressing. Wound #2 Right,Distal Lower Leg: Clean wound with Normal Saline. Cleanse wound with mild soap and water May Shower, gently pat wound dry prior to applying new dressing. Wound #3 Left,Lateral Lower Leg: Clean wound with Normal Saline. Cleanse wound with mild soap and water May Shower, gently pat wound dry prior to applying new dressing. Wound #8 Right,Posterior Lower Leg: Clean wound with Normal Saline. Cleanse wound with mild soap and water May Shower, gently pat wound dry prior to applying new  dressing. Wound #9 Left,Posterior Lower Leg: Clean wound with Normal Saline. Cleanse wound with mild soap and water May Shower, gently pat wound dry prior to applying new dressing. Anesthetic: Wound #1 Right,Proximal Lower Leg: Topical Lidocaine 4% cream applied to wound bed prior to debridement Wound #10 Left Gluteus: Topical Lidocaine 4% cream applied to wound bed prior to debridement Wound #11 Right Gluteus: Topical Lidocaine 4% cream applied to wound bed prior to debridement Wound #2 Right,Distal Lower Leg: Topical Lidocaine 4% cream applied to wound bed prior to debridement Wound #3 Left,Lateral Lower Leg: [place], Miyuki H. ([id number]) Topical Lidocaine 4% cream applied to wound bed prior to debridement Wound #8 Right,Posterior Lower Leg: Topical Lidocaine 4% cream applied to wound bed prior to debridement Wound #9 Left,Posterior Lower Leg: Topical Lidocaine 4% cream applied to wound bed prior to debridement Skin Barriers/Peri-Wound Care: Wound #10 Left Gluteus: Barrier cream Wound #11 Right Gluteus: Barrier cream Primary Wound Dressing: Wound #1 Right,Proximal Lower Leg: Aquacel Ag - Or Equivalent Wound #2 Right,Distal Lower Leg: Aquacel Ag - Or Equivalent Wound #3 Left,Lateral Lower Leg: Aquacel Ag - Or Equivalent Wound #8 Right,Posterior Lower Leg: Aquacel Ag - Or Equivalent Wound #9 Left,Posterior Lower Leg: Aquacel Ag - Or Equivalent Secondary Dressing: Wound #1 Right,Proximal Lower Leg: XtraSorb - Maxisorb or Equivalent for absorption Wound #  10 Left Gluteus: XtraSorb - Maxisorb or Equivalent for absorption Wound #11 Right Gluteus: XtraSorb - Maxisorb or Equivalent for absorption Wound #2 Right,Distal Lower Leg: XtraSorb - Maxisorb or Equivalent for absorption Wound #3 Left,Lateral Lower Leg: XtraSorb - Maxisorb or Equivalent for absorption Wound #8 Right,Posterior Lower Leg: XtraSorb - Maxisorb or Equivalent for absorption Wound #9 Left,Posterior Lower  Leg: XtraSorb - Maxisorb or Equivalent for absorption Dressing Change Frequency: Wound #1 Right,Proximal Lower Leg: Change Dressing Monday, Wednesday, Friday - Wednesday in wound care center Wound #10 Left Gluteus: Change Dressing Monday, Wednesday, Friday - Wednesday in wound care center Wound #11 Right Gluteus: Change Dressing Monday, Wednesday, Friday - Wednesday in wound care center Wound #2 Right,Distal Lower Leg: Change Dressing Monday, Wednesday, Friday - Wednesday in wound care center Wound #3 Left,Lateral Lower Leg: Change Dressing Monday, Wednesday, Friday - Wednesday in wound care center Wound #8 Right,Posterior Lower Leg: Change Dressing Monday, Wednesday, Friday - Wednesday in wound care center Wound #9 Left,Posterior Lower Leg: Change Dressing Monday, Wednesday, Friday - Wednesday in wound care center [facility], Lileigh H. ([id number]) Follow-up Appointments: Wound #1 Right,Proximal Lower Leg: Return Appointment in 1 week. Wound #10 Left Gluteus: Return Appointment in 1 week. Wound #11 Right Gluteus: Return Appointment in 1 week. Wound #2 Right,Distal Lower Leg: Return Appointment in 1 week. Wound #3 Left,Lateral Lower Leg: Return Appointment in 1 week. Wound #8 Right,Posterior Lower Leg: Return Appointment in 1 week. Wound #9 Left,Posterior Lower Leg: Return Appointment in 1 week. Edema Control: Wound #1 Right,Proximal Lower Leg: 3 Layer Compression System - Bilateral - Profore lite Elevate legs to the level of the heart and pump ankles as often as possible - Elevate legs at Dialysis Wound #2 Right,Distal Lower Leg: 3 Layer Compression System - Bilateral - Profore lite Elevate legs to the level of the heart and pump ankles as often as possible - Elevate legs at Dialysis Wound #3 Left,Lateral Lower Leg: 3 Layer Compression System - Bilateral - Profore lite Elevate legs to the level of the heart and pump ankles as often as possible - Elevate legs at Dialysis Wound  #8 Right,Posterior Lower Leg: 3 Layer Compression System - Bilateral - Profore lite Elevate legs to the level of the heart and pump ankles as often as possible - Elevate legs at Dialysis Wound #9 Left,Posterior Lower Leg: 3 Layer Compression System - Bilateral - Profore lite Elevate legs to the level of the heart and pump ankles as often as possible - Elevate legs at Dialysis Off-Loading: Wound #10 Left Gluteus: Turn and reposition every 2 hours Wound #11 Right Gluteus: Turn and reposition every 2 hours Home Health: Wound #1 Right,Proximal Lower Leg: Continue Home Health Visits - Advanced: Monday, Wednesday and Friday **WRAP GOES FROM BASE OF TOES TO THREE FINGER WIDTHS BENEATH BEND IN KNEE. Home Health Nurse may visit PRN to address patient s wound care needs. FACE TO FACE ENCOUNTER: MEDICARE and MEDICAID PATIENTS: I certify that this patient is under my care and that I had a face-to-face encounter that meets the physician face-to-face encounter requirements with this patient on this date. The encounter with the patient was in whole or in part for the following MEDICAL CONDITION: (primary reason for Home Healthcare) MEDICAL NECESSITY: I certify, that based on my findings, NURSING services are a medically necessary home health service. HOME BOUND STATUS: I certify that my clinical findings support that this patient is homebound (i.e., Due to illness or injury, pt requires aid of supportive devices such as  crutches, cane, wheelchairs, walkers, the use of special transportation or the assistance of another person to leave their place of residence. There is a normal inability to leave the home and doing so requires considerable and taxing effort. Other absences are for medical reasons / religious services and are infrequent or of short duration when for other reasons). [place], Briggette H. ([id number]) If current dressing causes regression in wound condition, may D/C ordered dressing product/s  and apply Normal Saline Moist Dressing daily until next Wound Healing Center / Other MD appointment. Notify Wound Healing Center of regression in wound condition at [phone]. Please direct any NON-WOUND related issues/requests for orders to patient's Primary Care Physician Wound #10 Left Gluteus: Continue Home Health Visits - Advanced: Monday, Wednesday and Friday **WRAP GOES FROM BASE OF TOES TO THREE FINGER WIDTHS BENEATH BEND IN KNEE. Home Health Nurse may visit PRN to address patient s wound care needs. FACE TO FACE ENCOUNTER: MEDICARE and MEDICAID PATIENTS: I certify that this patient is under my care and that I had a face-to-face encounter that meets the physician face-to-face encounter requirements with this patient on this date. The encounter with the patient was in whole or in part for the following MEDICAL CONDITION: (primary reason for Home Healthcare) MEDICAL NECESSITY: I certify, that based on my findings, NURSING services are a medically necessary home health service. HOME BOUND STATUS: I certify that my clinical findings support that this patient is homebound (i.e., Due to illness or injury, pt requires aid of supportive devices such as crutches, cane, wheelchairs, walkers, the use of special transportation or the assistance of another person to leave their place of residence. There is a normal inability to leave the home and doing so requires considerable and taxing effort. Other absences are for medical reasons / religious services and are infrequent or of short duration when for other reasons). If current dressing causes regression in wound condition, may D/C ordered dressing product/s and apply Normal Saline Moist Dressing daily until next Wound Healing Center / Other MD appointment. Notify Wound Healing Center of regression in wound condition at [phone]. Please direct any NON-WOUND related issues/requests for orders to patient's Primary Care Physician Wound #11  Right Gluteus: Continue Home Health Visits - Advanced: Monday, Wednesday and Friday **WRAP GOES FROM BASE OF TOES TO THREE FINGER WIDTHS BENEATH BEND IN KNEE. Home Health Nurse may visit PRN to address patient s wound care needs. FACE TO FACE ENCOUNTER: MEDICARE and MEDICAID PATIENTS: I certify that this patient is under my care and that I had a face-to-face encounter that meets the physician face-to-face encounter requirements with this patient on this date. The encounter with the patient was in whole or in part for the following MEDICAL CONDITION: (primary reason for Home Healthcare) MEDICAL NECESSITY: I certify, that based on my findings, NURSING services are a medically necessary home health service. HOME BOUND STATUS: I certify that my clinical findings support that this patient is homebound (i.e., Due to illness or injury, pt requires aid of supportive devices such as crutches, cane, wheelchairs, walkers, the use of special transportation or the assistance of another person to leave their place of residence. There is a normal inability to leave the home and doing so requires considerable and taxing effort. Other absences are for medical reasons / religious services and are infrequent or of short duration when for other reasons). If current dressing causes regression in wound condition, may D/C ordered dressing product/s and apply Normal Saline Moist Dressing daily until next  Wound Healing Center / Other MD appointment. Notify Wound Healing Center of regression in wound condition at [phone]. Please direct any NON-WOUND related issues/requests for orders to patient's Primary Care Physician Wound #2 Right,Distal Lower Leg: Continue Home Health Visits - Advanced: Monday, Wednesday and Friday **WRAP GOES FROM BASE OF TOES TO THREE FINGER WIDTHS BENEATH BEND IN KNEE. Home Health Nurse may visit PRN to address patient s wound care needs. FACE TO FACE ENCOUNTER: MEDICARE and MEDICAID  PATIENTS: I certify that this patient is under my care and that I had a face-to-face encounter that meets the physician face-to-face encounter requirements with this patient on this date. The encounter with the patient was in whole or in part for the following MEDICAL CONDITION: (primary reason for Home Healthcare) MEDICAL NECESSITY: I certify, that based on my findings, NURSING services are a medically necessary home health service. HOME BOUND STATUS: I certify that my clinical findings support that this patient is homebound (i.e., Due to illness or injury, pt requires aid of supportive devices such as crutches, cane, wheelchairs, walkers, the use Alfrey, Jessia H. ([id number]) of special transportation or the assistance of another person to leave their place of residence. There is a normal inability to leave the home and doing so requires considerable and taxing effort. Other absences are for medical reasons / religious services and are infrequent or of short duration when for other reasons). If current dressing causes regression in wound condition, may D/C ordered dressing product/s and apply Normal Saline Moist Dressing daily until next Wound Healing Center / Other MD appointment. Notify Wound Healing Center of regression in wound condition at [phone]. Please direct any NON-WOUND related issues/requests for orders to patient's Primary Care Physician Wound #3 Left,Lateral Lower Leg: Continue Home Health Visits - Advanced: Monday, Wednesday and Friday **WRAP GOES FROM BASE OF TOES TO THREE FINGER WIDTHS BENEATH BEND IN KNEE. Home Health Nurse may visit PRN to address patient s wound care needs. FACE TO FACE ENCOUNTER: MEDICARE and MEDICAID PATIENTS: I certify that this patient is under my care and that I had a face-to-face encounter that meets the physician face-to-face encounter requirements with this patient on this date. The encounter with the patient was in whole or in part for  the following MEDICAL CONDITION: (primary reason for Home Healthcare) MEDICAL NECESSITY: I certify, that based on my findings, NURSING services are a medically necessary home health service. HOME BOUND STATUS: I certify that my clinical findings support that this patient is homebound (i.e., Due to illness or injury, pt requires aid of supportive devices such as crutches, cane, wheelchairs, walkers, the use of special transportation or the assistance of another person to leave their place of residence. There is a normal inability to leave the home and doing so requires considerable and taxing effort. Other absences are for medical reasons / religious services and are infrequent or of short duration when for other reasons). If current dressing causes regression in wound condition, may D/C ordered dressing product/s and apply Normal Saline Moist Dressing daily until next Wound Healing Center / Other MD appointment. Notify Wound Healing Center of regression in wound condition at [phone]. Please direct any NON-WOUND related issues/requests for orders to patient's Primary Care Physician Wound #8 Right,Posterior Lower Leg: Continue Home Health Visits - Advanced: Monday, Wednesday and Friday **WRAP GOES FROM BASE OF TOES TO THREE FINGER WIDTHS BENEATH BEND IN KNEE. Home Health Nurse may visit PRN to address patient s wound care needs. FACE TO FACE ENCOUNTER: MEDICARE  and MEDICAID PATIENTS: I certify that this patient is under my care and that I had a face-to-face encounter that meets the physician face-to-face encounter requirements with this patient on this date. The encounter with the patient was in whole or in part for the following MEDICAL CONDITION: (primary reason for Home Healthcare) MEDICAL NECESSITY: I certify, that based on my findings, NURSING services are a medically necessary home health service. HOME BOUND STATUS: I certify that my clinical findings support that this patient is  homebound (i.e., Due to illness or injury, pt requires aid of supportive devices such as crutches, cane, wheelchairs, walkers, the use of special transportation or the assistance of another person to leave their place of residence. There is a normal inability to leave the home and doing so requires considerable and taxing effort. Other absences are for medical reasons / religious services and are infrequent or of short duration when for other reasons). If current dressing causes regression in wound condition, may D/C ordered dressing product/s and apply Normal Saline Moist Dressing daily until next Wound Healing Center / Other MD appointment. Notify Wound Healing Center of regression in wound condition at [phone]. Please direct any NON-WOUND related issues/requests for orders to patient's Primary Care Physician Wound #9 Left,Posterior Lower Leg: Continue Home Health Visits - Advanced: Monday, Wednesday and Friday **WRAP GOES FROM BASE OF TOES TO THREE FINGER WIDTHS BENEATH BEND IN KNEE. Home Health Nurse may visit PRN to address patient s wound care needs. FACE TO FACE ENCOUNTER: MEDICARE and MEDICAID PATIENTS: I certify that this patient is under my care and that I had a face-to-face encounter that meets the physician face-to-face encounter requirements with this patient on this date. The encounter with the patient was in whole or in part for the following MEDICAL CONDITION: (primary reason for Home Healthcare) MEDICAL NECESSITY: I certify, Cardenas, Chyrel H. ([id number]) that based on my findings, NURSING services are a medically necessary home health service. HOME BOUND STATUS: I certify that my clinical findings support that this patient is homebound (i.e., Due to illness or injury, pt requires aid of supportive devices such as crutches, cane, wheelchairs, walkers, the use of special transportation or the assistance of another person to leave their place of residence. There is a normal  inability to leave the home and doing so requires considerable and taxing effort. Other absences are for medical reasons / religious services and are infrequent or of short duration when for other reasons). If current dressing causes regression in wound condition, may D/C ordered dressing product/s and apply Normal Saline Moist Dressing daily until next Wound Healing Center / Other MD appointment. Notify Wound Healing Center of regression in wound condition at [phone]. Please direct any NON-WOUND related issues/requests for orders to patient's Primary Care Physician Silver alginate. Profore light bilaterally. Frequent leg elevation. Zinc oxide to sacral pressure ulcer. Continue offloading with hospital bed. Her home health orders were recently signed. Electronic Signature(s) Signed: 06/07/2015 3:24:44 PM By: Madelaine Bhat MD Entered By: Madelaine Bhat on 06/07/2015 13:09:47 Chakraborty, [name] ([id number]) -------------------------------------------------------------------------------- SuperBill Details Patient Name: Kehl, Inger H. Date of Service: 06/07/2015 Medical Record Patient Account Number: [id number] Number: Treating RN: Huel Coventry February 05, 1956 (58 y.o. 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 R60.0 Localized edema L89.152 Pressure ulcer of sacral region, stage 2 Facility Procedures CPT4 Code: 16109604 Description: 11042 - DEB SUBQ TISSUE 20 SQ CM/< ICD-10 Description Diagnosis L97.222 Non-pressure chronic ulcer of left  calf with fat l L97.212 Non-pressure chronic ulcer of right calf with fat Modifier: ayer exposed layer exposed Quantity: 1 CPT4 Code: 54098119 Description: 11045 - DEB SUBQ TISS EA ADDL 20CM ICD-10 Description Diagnosis L97.222 Non-pressure chronic ulcer of left calf with fat l L97.212 Non-pressure chronic ulcer of right calf with fat Modifier: ayer exposed layer exposed Quantity: 3 Physician Procedures CPT4 Code: 1478295 Description: 99213 - WC PHYS LEVEL 3 - EST PT ICD-10 Description Diagnosis L89.152 Pressure ulcer of sacral region, stage 2 Modifier: Quantity: 1 CPT4 Code: 6213086 [place], PAM Description: 11042 - WC PHYS SUBQ TISS 20 SQ CM Description ELA H. ([id number]) Modifier: Quantity: 1 Electronic Signature(s) Signed: 06/07/2015 3:24:44 PM By: Madelaine Bhat MD Entered By: Madelaine Bhat on 06/07/2015 09:16:48

## 2015-06-08 NOTE — Progress Notes (Addendum)
[name] ([id number]) Visit Report for 06/07/2015 Arrival Information Details Patient Name: [name], [name]. Date of Service: 06/07/2015 8:15 AM Medical Record Patient Account Number: [id number] Number: Treating RN: Phillis Haggis May 22, 1956 (58 y.o. Other Clinician: Date of Birth/Sex: Female) Treating BURNS III, Primary Care Physician: Fidel Levy Physician/Extender: Zollie Beckers Referring Physician: Jones Broom in Treatment: 18 Visit Information History Since Last Visit All ordered tests and consults were completed: No Patient Arrived: Ambulatory Added or deleted any medications: No Arrival Time: 08:22 Any new allergies or adverse reactions: No Accompanied By: self Had a fall or experienced change in Yes Transfer Assistance: None activities of daily living that may affect Patient Identification Verified: Yes risk of falls: Secondary Verification Process Yes Signs or symptoms of abuse/neglect since last No Completed: visito Patient Requires Transmission- No Hospitalized since last visit: Yes Based Precautions: Pain Present Now: Yes Patient Has Alerts: Yes Patient Alerts: DMII ABI Marion Bilateral Electronic Signature(s) Signed: 06/07/2015 4:01:39 PM By: Alejandro Mulling Entered By: Alejandro Mulling on 06/07/2015 08:23:19 Bokhari, Shaquna H. ([id number]) -------------------------------------------------------------------------------- Complex / Palliative Patient Assessment Details Patient Name: [name], [name] Date of Service: 06/07/2015 8:15 AM Medical Record Patient Account Number: [id number] Number: Treating RN: Huel Coventry May 17, 1956 (58 y.o. Other Clinician: Date of Birth/Sex: Female) Treating BURNS III, Primary Care Physician: Fidel Levy Physician/Extender: Zollie Beckers Referring Physician: Jones Broom in Treatment: 18 Palliative Management Criteria Complex Wound Management Criteria Patient has  remarkable or complex co-morbidities requiring medications or treatments that extend wound healing times. Examples: o Diabetes mellitus with chronic renal failure or end stage renal disease requiring dialysis o Advanced or poorly controlled rheumatoid arthritis o Diabetes mellitus and end stage chronic obstructive pulmonary disease o Active cancer with current chemo- or radiation therapy DM and ESRD, Dialysis Care Approach Wound Care Plan: Complex Wound Management Electronic Signature(s) Signed: 06/12/2015 5:17:02 PM By: Elliot Gurney RN, BSN, Kim RN, BSN Signed: 06/14/2015 7:55:59 AM By: Madelaine Bhat MD Entered By: Elliot Gurney, RN, BSN, Kim on 06/12/2015 14:02:02 [name] ([id number]) -------------------------------------------------------------------------------- Encounter Discharge Information Details Patient Name: [name], [name] Date of Service: 06/07/2015 8:15 AM Medical Record Patient Account Number: [id number] Number: Treating RN: Phillis Haggis 1955/10/29 (58 y.o. Other Clinician: Date of Birth/Sex: Female) Treating BURNS III, Primary Care Physician: Fidel Levy Physician/Extender: Zollie Beckers Referring Physician: Jones Broom in Treatment: 66 Encounter Discharge Information Items Discharge Pain Level: 0 Discharge Condition: Stable Ambulatory Status: Ambulatory Discharge Destination: Home Transportation: Private Auto Accompanied By: self Schedule Follow-up Appointment: Yes Medication Reconciliation completed and provided to Patient/Care Yes [name]: Provided on Clinical Summary of Care: 06/07/2015 Form Type Recipient Paper Patient PM Electronic Signature(s) Signed: 06/07/2015 9:39:07 AM By: Gwenlyn Perking Entered By: Gwenlyn Perking on 06/07/2015 09:39:07 [name], [name] ([id number]) -------------------------------------------------------------------------------- Lower Extremity Assessment Details Patient Name:  [name], [name] Date of Service: 06/07/2015 8:15 AM Medical Record Patient Account Number: [id number] Number: Treating RN: Phillis Haggis Apr 29, 1956 (58 y.o. Other Clinician: Date of Birth/Sex: Female) Treating BURNS III, Primary Care Physician: Fidel Levy Physician/Extender: Zollie Beckers Referring Physician: Jones Broom in Treatment: 18 Edema Assessment Assessed: [Left: No] [Right: No] Edema: [Left: Yes] [Right: Yes] Calf Left: Right: Point of Measurement: cm From Medial Instep 50 cm 45.4 cm Ankle Left: Right: Point of Measurement: cm From Medial Instep 27 cm 25.5 cm Vascular Assessment Claudication: Claudication Assessment [Left:None] [Right:None] Pulses: Posterior Tibial Dorsalis Pedis Palpable: [Left:Yes] [Right:Yes] Extremity colors, hair growth, and conditions: Extremity Color: [  Left:Normal] [Right:Normal] Electronic Signature(s) Signed: 06/07/2015 4:01:39 PM By: Alejandro Mulling Entered By: Alejandro Mulling on 06/07/2015 08:29:14 [name], [name] ([id number]) -------------------------------------------------------------------------------- Multi Wound Chart Details Patient Name: [name], [name] Date of Service: 06/07/2015 8:15 AM Medical Record Patient Account Number: [id number] Number: Treating RN: Phillis Haggis 1955/10/09 (58 y.o. 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] [10:Pressure Ulcer] [11:Pressure Ulcer] Comorbid History: [1:Hypertension, Type II Diabetes] [10:Hypertension, Type II Diabetes] [11:Hypertension, Type II Diabetes] Date Acquired: [1:01/09/2015] [10:05/31/2015] [11:05/31/2015] Weeks of Treatment: [1:18] [10:0] [11:0] Wound Status: [1:Open] [10:Open] [11:Open] Measurements L x W x D 9x10x0.6 [10:4x3x0.1] [11:0.5x2x0.2] (cm) Area (cm) : [1:70.686] [10:9.425] [11:0.785] Volume (cm) : [1:42.412] [10:0.942] [11:0.157] % Reduction in Area: [1:-150295.70%] [10:N/A] [11:N/A] % Reduction in Volume: -848140.00% [10:N/A] [11:N/A] Classification: [1:Full Thickness Without Exposed Support Structures] [10:Category/Stage II] [11:Category/Stage II] HBO Classification: [1:Grade 2] [10:N/A] [11:N/A] Exudate Amount: [1:Medium] [10:N/A] [11:N/A] Exudate Type: [1:Serous] [10:N/A] [11:N/A] Exudate Color: [1:amber] [10:N/A] [11:N/A] Wound Margin: [1:Thickened] [10:Flat and Intact] [11:N/A] Granulation Amount: [1:Small (1-33%)] [10:N/A] [11:N/A] Granulation Quality: [1:Pink] [10:N/A] [11:N/A] Necrotic Amount: [1:Large (67-100%)] [10:N/A] [11:N/A] Exposed Structures: [10:N/A] [11:N/A] Fascia: No Fat: No Tendon: No Muscle: No Joint: No Bone: No Limited to Skin Breakdown Epithelialization: None N/A N/A Periwound Skin Texture: Scarring: Yes No Abnormalities Noted No Abnormalities Noted Edema: No Excoriation: No Induration: No Callus: No Crepitus: No Fluctuance: No Friable: No Rash: No Periwound Skin Moist: Yes No Abnormalities Noted No Abnormalities Noted Moisture: Maceration: No Dry/Scaly: No Periwound Skin Color: Atrophie Blanche: No No Abnormalities Noted No Abnormalities Noted Cyanosis: No Ecchymosis: No Erythema: No Hemosiderin Staining: No Mottled: No Pallor: No Rubor: No Tenderness on No No No Palpation: Wound Preparation: Ulcer Cleansing: N/A N/A Rinsed/Irrigated  with Saline Topical Anesthetic Applied: Other: lidocaine 4% Wound Number: 2 3 8  Photos: No Photos No Photos No Photos Wound Location: Right, Distal Lower Leg Left, Lateral Lower Leg Right, Posterior Lower Leg Wounding Event: Gradually Appeared Gradually Appeared Gradually Appeared Primary Etiology: Calciphylaxis Calciphylaxis Calciphylaxis Comorbid History: N/A N/A N/A Date Acquired: 01/09/2015 01/09/2015 03/27/2015 Weeks of Treatment: 18 18 9  Wound Status: Open Open Open 4.5x12x0.3 23x9x0.9 5.5x4x0.3 Oommen, Brianne H. ([id number]) Measurements L x W x D (cm) Area (cm) : 42.412 162.577 17.279 Volume (cm) : 12.723 146.32 5.184 % Reduction in Area: -45019.10% -1378.50% -877.90% % Reduction in Volume: -141266.70% -13201.80% -2828.80% Classification: Full Thickness Without Full Thickness Without Full Thickness Without Exposed Support Exposed Support Exposed Support Structures Structures Structures HBO Classification: N/A N/A N/A Exudate Amount: N/A N/A N/A Exudate Type: N/A N/A N/A Exudate Color: N/A N/A N/A Wound Margin: N/A N/A N/A Granulation Amount: N/A N/A N/A Granulation Quality: N/A N/A N/A Necrotic Amount: N/A N/A N/A Exposed Structures: N/A N/A N/A Epithelialization: N/A N/A N/A Periwound Skin Texture: No Abnormalities Noted No Abnormalities Noted No Abnormalities Noted Periwound Skin No Abnormalities Noted No Abnormalities Noted No Abnormalities Noted Moisture: Periwound Skin Color: No Abnormalities Noted No Abnormalities Noted No Abnormalities Noted Tenderness on No No No Palpation: Wound Preparation: N/A N/A N/A Wound Number: 9 N/A N/A Photos: No Photos N/A N/A Wound Location: Left,  Posterior Lower Leg N/A N/A Wounding Event: Gradually Appeared N/A N/A Primary Etiology: Calciphylaxis N/A N/A Comorbid History: N/A N/A N/A Date Acquired: 03/13/2015 N/A N/A Weeks of Treatment: 6 N/A N/A Wound Status: Open N/A N/A Measurements L x W x D 4x3x0.6 N/A  N/A (cm) Area (cm) : 9.425 N/A N/A Volume (cm) : 5.655 N/A N/A % Reduction in Area: 40.00% N/A N/A % Reduction in Volume: -80.00% N/A N/A Classification: Full Thickness Without N/A N/A Exposed Support Structures HBO Classification: N/A N/A N/A Exudate Amount: N/A N/A N/A Exudate Type: N/A N/A N/A Exudate Color: N/A N/A N/A [name], [name] ([id number]) Wound Margin: N/A N/A N/A Granulation Amount: N/A N/A N/A Granulation Quality: N/A N/A N/A Necrotic Amount: N/A N/A N/A Exposed Structures: N/A N/A N/A Epithelialization: N/A N/A N/A Periwound Skin Texture: No Abnormalities Noted N/A N/A Periwound Skin No Abnormalities Noted N/A N/A Moisture: Periwound Skin Color: No Abnormalities Noted N/A N/A Tenderness on No N/A N/A Palpation: Wound Preparation: N/A N/A N/A Treatment Notes Electronic Signature(s) Signed: 06/07/2015 4:01:39 PM By: Alejandro MullingPinkerton, Debra Entered By: Alejandro MullingPinkerton, Debra on 06/07/2015 09:03:34 Mcleroy, Virtie Rexene EdisonH. ([id number]) -------------------------------------------------------------------------------- Multi-Disciplinary Care Plan Details Patient Name: [name], [name] Date of Service: 06/07/2015 8:15 AM Medical Record Patient Account Number: [id number] Number: Treating RN: Phillis Haggisinkerton, Debi 1956-03-24 (58 y.o. Other Clinician: Date of Birth/Sex: Female) Treating BURNS III, Primary Care Physician: Fidel LevyHAWKINS JR, JAMES Physician/Extender: Zollie BeckersWALTER Referring Physician: Jones BroomHAWKINS JR, JAMES Weeks in Treatment: 1018 Active Inactive Orientation to the Wound Care Program Nursing Diagnoses: Knowledge deficit related to the wound healing center program Goals: Patient/caregiver will verbalize understanding of the Wound Healing Center Program Date Initiated: 01/30/2015 Goal Status: Active Interventions: Provide education on orientation to the wound center Notes: Pain, Acute or Chronic Nursing Diagnoses: Pain, acute or chronic: actual or  potential Goals: Patient will verbalize adequate pain control and receive pain control interventions during procedures as needed Date Initiated: 01/30/2015 Goal Status: Active Patient/caregiver will verbalize adequate pain control between visits Date Initiated: 01/30/2015 Goal Status: Active Interventions: Assess comfort goal upon admission Encourage patient to take pain medications as prescribed Notes: Venous Leg Ulcer [name], [name] ([id number]) Nursing Diagnoses: Potential for venous Insuffiency (use before diagnosis confirmed) Goals: Non-invasive venous studies are completed as ordered Date Initiated: 01/30/2015 Goal Status: Active Interventions: Assess peripheral edema status every visit. Notes: Wound/Skin Impairment Nursing Diagnoses: Impaired tissue integrity Goals: Ulcer/skin breakdown will have a volume reduction of 30% by week 4 Date Initiated: 01/30/2015 Goal Status: Active Ulcer/skin breakdown will have a volume reduction of 50% by week 8 Date Initiated: 01/30/2015 Goal Status: Active Ulcer/skin breakdown will have a volume reduction of 80% by week 12 Date Initiated: 01/30/2015 Goal Status: Active Ulcer/skin breakdown will heal within 14 weeks Date Initiated: 01/30/2015 Goal Status: Active Interventions: Assess ulceration(s) every visit Notes: Electronic Signature(s) Signed: 06/07/2015 4:01:39 PM By: Alejandro MullingPinkerton, Debra Entered By: Alejandro MullingPinkerton, Debra on 06/07/2015 09:03:09 [name], [name] ([id number]) -------------------------------------------------------------------------------- Pain Assessment Details Patient Name: [name], [name] Date of Service: 06/07/2015 8:15 AM Medical Record Patient Account Number: [id number] Number: Treating RN: Phillis Haggisinkerton, Debi 1956-03-24 (58 y.o. Other Clinician: Date of Birth/Sex: Female) Treating BURNS III, Primary Care Physician: Fidel LevyHAWKINS JR, JAMES Physician/Extender: Zollie BeckersWALTER Referring Physician: Jones BroomHAWKINS JR,  JAMES Weeks in Treatment: 5618 Active Problems Location of Pain Severity and Description of Pain Patient Has Paino Yes Site Locations Pain Location: Pain in Ulcers With Dressing Change: Yes Duration of the Pain. Constant / Intermittento Intermittent Rate the pain. Current Pain Level: 5 Worst Pain  Level: 10 Least Pain Level: 0 Character of Pain Describe the Pain: Sharp, Tender, Throbbing Pain Management and Medication Current Pain Management: Electronic Signature(s) Signed: 06/07/2015 4:01:39 PM By: Alejandro Mulling Entered By: Alejandro Mulling on 06/07/2015 08:25:21 Skidgel, Aaron [name] ([id number]) -------------------------------------------------------------------------------- Patient/Caregiver Education Details Patient Name: [name], [name] Date of Service: 06/07/2015 8:15 AM Medical Record Patient Account Number: [id number] Number: Treating RN: Phillis Haggis 06-04-1956 (58 y.o. Other Clinician: Date of Birth/Gender: Female) Treating BURNS III, Primary Care Physician: Fidel Levy Physician/Extender: Zollie Beckers Referring Physician: Jones Broom in Treatment: 52 Education Assessment Education Provided To: Patient Education Topics Provided Wound/Skin Impairment: Handouts: Caring for Your Ulcer, Other: wound care as prsecribed Methods: Demonstration, Explain/Verbal Responses: State content correctly Electronic Signature(s) Signed: 06/07/2015 4:01:39 PM By: Alejandro Mulling Entered By: Alejandro Mulling on 06/07/2015 09:37:33 [name], [name] ([id number]) -------------------------------------------------------------------------------- Wound Assessment Details Patient Name: [name], [name] Date of Service: 06/07/2015 8:15 AM Medical Record Patient Account Number: [id number] Number: Treating RN: Phillis Haggis 1956/04/08 (58 y.o. Other Clinician: Date of Birth/Sex: Female) Treating BURNS III, Primary Care Physician:  Fidel Levy Physician/Extender: Zollie Beckers Referring Physician: Jones Broom in Treatment: 18 Wound Status Wound Number: 1 Primary Etiology: Calciphylaxis Wound Location: Right Lower Leg - Proximal Wound Status: Open Wounding Event: Gradually Appeared Comorbid History: Hypertension, Type II Diabetes Date Acquired: 01/09/2015 Weeks Of Treatment: 18 Clustered Wound: No Wound Measurements Length: (cm) 9 Width: (cm) 10 Depth: (cm) 0.6 Area: (cm) 70.686 Volume: (cm) 42.412 % Reduction in Area: -150295.7% % Reduction in Volume: -848140% Epithelialization: None Tunneling: No Undermining: No Wound Description Full Thickness Without Foul Odor Aft Classification: Exposed Support Structures Diabetic Severity Grade 2 (Wagner): Wound Margin: Thickened Exudate Amount: Medium Exudate Type: Serous Exudate Color: amber er Cleansing: No Wound Bed Granulation Amount: Small (1-33%) Exposed Structure Granulation Quality: Pink Fascia Exposed: No Necrotic Amount: Large (67-100%) Fat Layer Exposed: No Tendon Exposed: No Muscle Exposed: No Joint Exposed: No Bone Exposed: No Limited to Skin Breakdown Periwound Skin Texture Texture Color Quattrone, Hetvi H. ([id number]) No Abnormalities Noted: No No Abnormalities Noted: No Callus: No Atrophie Blanche: No Crepitus: No Cyanosis: No Excoriation: No Ecchymosis: No Fluctuance: No Erythema: No Friable: No Hemosiderin Staining: No Induration: No Mottled: No Localized Edema: No Pallor: No Rash: No Rubor: No Scarring: Yes Moisture No Abnormalities Noted: No Dry / Scaly: No Maceration: No Moist: Yes Wound Preparation Ulcer Cleansing: Rinsed/Irrigated with Saline Topical Anesthetic Applied: Other: lidocaine 4%, Electronic Signature(s) Signed: 06/07/2015 4:01:39 PM By: Alejandro Mulling Entered By: Alejandro Mulling on 06/07/2015 09:02:19 Bacigalupi, Zenna Rexene [name]  ([id number]) -------------------------------------------------------------------------------- Wound Assessment Details Patient Name: Papin, Adisen H. Date of Service: 06/07/2015 8:15 AM Medical Record Patient Account Number: [id number] Number: Treating RN: Phillis Haggis 13-Jul-1956 (58 y.o. Other Clinician: Date of Birth/Sex: Female) Treating BURNS III, Primary Care Physician: Fidel Levy Physician/Extender: Zollie Beckers Referring Physician: Jones Broom in Treatment: 18 Wound Status Wound Number: 10 Primary Etiology: Pressure Ulcer Wound Location: Left Gluteus Wound Status: Open Wounding Event: Gradually Appeared Comorbid History: Hypertension, Type II Diabetes Date Acquired: 05/31/2015 Weeks Of Treatment: 0 Clustered Wound: No Wound Measurements Length: (cm) 4 % Reduction Width: (cm) 3 % Reduction Depth: (cm) 0.1 Area: (cm) 9.425 Volume: (cm) 0.942 in Area: in Volume: Wound Description Classification: Category/Stage II Wound Margin: Flat and Intact Periwound Skin Texture Texture Color No Abnormalities Noted: No No Abnormalities Noted: No Moisture No Abnormalities Noted: No Electronic Signature(s) Signed: 06/07/2015 4:01:39 PM By: Alejandro Mulling Entered  By: Alejandro Mulling on 06/07/2015 08:43:09 Medero, Nell H. ([id number]) -------------------------------------------------------------------------------- Wound Assessment Details Patient Name: Baswell, Yamili H. Date of Service: 06/07/2015 8:15 AM Medical Record Patient Account Number: [id number] Number: Treating RN: Phillis Haggis 12/12/1955 (58 y.o. Other Clinician: Date of Birth/Sex: Female) Treating BURNS III, Primary Care Physician: Fidel Levy Physician/Extender: Zollie Beckers Referring Physician: Jones Broom in Treatment: 18 Wound Status Wound Number: 11 Primary Etiology: Pressure Ulcer Wound Location: Right Gluteus Wound Status:  Open Wounding Event: Gradually Appeared Comorbid History: Hypertension, Type II Diabetes Date Acquired: 05/31/2015 Weeks Of Treatment: 0 Clustered Wound: No Wound Measurements Length: (cm) 0.5 % Reduction Width: (cm) 2 % Reduction Depth: (cm) 0.2 Area: (cm) 0.785 Volume: (cm) 0.157 in Area: in Volume: Wound Description Classification: Category/Stage II Periwound Skin Texture Texture Color No Abnormalities Noted: No No Abnormalities Noted: No Moisture No Abnormalities Noted: No Electronic Signature(s) Signed: 06/07/2015 4:01:39 PM By: Alejandro Mulling Entered By: Alejandro Mulling on 06/07/2015 08:47:44 Elsass, Avia H. ([id number]) -------------------------------------------------------------------------------- Wound Assessment Details Patient Name: Ureta, Verdene H. Date of Service: 06/07/2015 8:15 AM Medical Record Patient Account Number: [id number] Number: Treating RN: Phillis Haggis [birth year] (58 y.o. Other Clinician: Date of Birth/Sex: Female) Treating BURNS III, Primary Care Physician: Fidel Levy Physician/Extender: Zollie Beckers Referring Physician: Jones Broom in Treatment: 18 Wound Status Wound Number: 2 Primary Etiology: Calciphylaxis Wound Location: Right, Distal Lower Leg Wound Status: Open Wounding Event: Gradually Appeared Date Acquired: 01/09/2015 Weeks Of Treatment: 18 Clustered Wound: No Wound Measurements Length: (cm) 4.5 Width: (cm) 12 Depth: (cm) 0.3 Area: (cm) 42.412 Volume: (cm) 12.723 % Reduction in Area: -45019.1% % Reduction in Volume: -141266.7% Wound Description Full Thickness Without Exposed Classification: Support Structures Periwound Skin Texture Texture Color No Abnormalities Noted: No No Abnormalities Noted: No Moisture No Abnormalities Noted: No Electronic Signature(s) Signed: 06/07/2015 4:01:39 PM By: Alejandro Mulling Entered By: Alejandro Mulling on 06/07/2015 08:46:52 Maull,  Deloise H. ([id number]) -------------------------------------------------------------------------------- Wound Assessment Details Patient Name: Prescott, Dezhane H. Date of Service: 06/07/2015 8:15 AM Medical Record Patient Account Number: [id number] Number: Treating RN: Phillis Haggis 1955-09-26 (58 y.o. Other Clinician: Date of Birth/Sex: Female) Treating BURNS III, Primary Care Physician: Fidel Levy Physician/Extender: Zollie Beckers Referring Physician: Jones Broom in Treatment: 18 Wound Status Wound Number: 3 Primary Etiology: Calciphylaxis Wound Location: Left, Lateral Lower Leg Wound Status: Open Wounding Event: Gradually Appeared Date Acquired: 01/09/2015 Weeks Of Treatment: 18 Clustered Wound: No Wound Measurements Length: (cm) 23 Width: (cm) 9 Depth: (cm) 0.9 Area: (cm) 162.577 Volume: (cm) 146.32 % Reduction in Area: -1378.5% % Reduction in Volume: -13201.8% Wound Description Full Thickness Without Exposed Classification: Support Structures Periwound Skin Texture Texture Color No Abnormalities Noted: No No Abnormalities Noted: No Moisture No Abnormalities Noted: No Electronic Signature(s) Signed: 06/07/2015 4:01:39 PM By: Alejandro Mulling Entered By: Alejandro Mulling on 06/07/2015 08:46:53 Holtrop, Azura H. ([id number]) -------------------------------------------------------------------------------- Wound Assessment Details Patient Name: Skowron, Sigrid H. Date of Service: 06/07/2015 8:15 AM Medical Record Patient Account Number: [id number] Number: Treating RN: Phillis Haggis 09-14-1955 (58 y.o. Other Clinician: Date of Birth/Sex: Female) Treating BURNS III, Primary Care Physician: Fidel Levy Physician/Extender: Zollie Beckers Referring Physician: Jones Broom in Treatment: 18 Wound Status Wound Number: 8 Primary Etiology: Calciphylaxis Wound Location: Right, Posterior Lower Leg Wound Status:  Open Wounding Event: Gradually Appeared Date Acquired: 03/27/2015 Weeks Of Treatment: 9 Clustered Wound: No Wound Measurements Length: (cm) 5.5 Width: (cm) 4 Depth: (cm) 0.3 Area: (cm) 17.279 Volume: (cm) 5.184 %  Reduction in Area: -877.9% % Reduction in Volume: -2828.8% Wound Description Full Thickness Without Exposed Classification: Support Structures Periwound Skin Texture Texture Color No Abnormalities Noted: No No Abnormalities Noted: No Moisture No Abnormalities Noted: No Electronic Signature(s) Signed: 06/07/2015 4:01:39 PM By: Alejandro Mulling Entered By: Alejandro Mulling on 06/07/2015 08:46:53 Roddey, Caila H. ([id number]) -------------------------------------------------------------------------------- Wound Assessment Details Patient Name: Bachar, Lorece H. Date of Service: 06/07/2015 8:15 AM Medical Record Patient Account Number: [id number] Number: Treating RN: Phillis Haggis October 28, 1955 (58 y.o. Other Clinician: Date of Birth/Sex: Female) Treating BURNS III, Primary Care Physician: Fidel Levy Physician/Extender: Zollie Beckers Referring Physician: Jones Broom in Treatment: 18 Wound Status Wound Number: 9 Primary Etiology: Calciphylaxis Wound Location: Left, Posterior Lower Leg Wound Status: Open Wounding Event: Gradually Appeared Date Acquired: 03/13/2015 Weeks Of Treatment: 6 Clustered Wound: No Wound Measurements Length: (cm) 4 Width: (cm) 3 Depth: (cm) 0.6 Area: (cm) 9.425 Volume: (cm) 5.655 % Reduction in Area: 40% % Reduction in Volume: -80% Wound Description Full Thickness Without Exposed Classification: Support Structures Periwound Skin Texture Texture Color No Abnormalities Noted: No No Abnormalities Noted: No Moisture No Abnormalities Noted: No Electronic Signature(s) Signed: 06/07/2015 4:01:39 PM By: Alejandro Mulling Entered By: Alejandro Mulling on 06/07/2015 08:46:53 Dickman, Zoriyah H.  ([id number]) -------------------------------------------------------------------------------- Vitals Details Patient Name: Picco, Tifany H. Date of Service: 06/07/2015 8:15 AM Medical Record Patient Account Number: [id number] Number: Treating RN: Phillis Haggis 13-Jan-1956 (58 y.o. Other Clinician: Date of Birth/Sex: Female) Treating BURNS III, Primary Care Physician: Fidel Levy Physician/Extender: Zollie Beckers Referring Physician: Jones Broom in Treatment: 18 Vital Signs Time Taken: 08:20 Temperature (F): 98.1 Height (in): 68 Pulse (bpm): 74 Weight (lbs): 294 Respiratory Rate (breaths/min): 18 Body Mass Index (BMI): 44.7 Blood Pressure (mmHg): 138/78 Reference Range: 80 - 120 mg / dl Electronic Signature(s) Signed: 06/07/2015 4:01:39 PM By: Alejandro Mulling Entered By: Alejandro Mulling on 06/07/2015 08:26:37

## 2015-06-09 DIAGNOSIS — N2581 Secondary hyperparathyroidism of renal origin: Secondary | ICD-10-CM | POA: Diagnosis not present

## 2015-06-09 DIAGNOSIS — D509 Iron deficiency anemia, unspecified: Secondary | ICD-10-CM | POA: Diagnosis not present

## 2015-06-09 DIAGNOSIS — D631 Anemia in chronic kidney disease: Secondary | ICD-10-CM | POA: Diagnosis not present

## 2015-06-09 DIAGNOSIS — N186 End stage renal disease: Secondary | ICD-10-CM | POA: Diagnosis not present

## 2015-06-09 DIAGNOSIS — Z992 Dependence on renal dialysis: Secondary | ICD-10-CM | POA: Diagnosis not present

## 2015-06-12 DIAGNOSIS — Z992 Dependence on renal dialysis: Secondary | ICD-10-CM | POA: Diagnosis not present

## 2015-06-12 DIAGNOSIS — N186 End stage renal disease: Secondary | ICD-10-CM | POA: Diagnosis not present

## 2015-06-12 DIAGNOSIS — D631 Anemia in chronic kidney disease: Secondary | ICD-10-CM | POA: Diagnosis not present

## 2015-06-12 DIAGNOSIS — D509 Iron deficiency anemia, unspecified: Secondary | ICD-10-CM | POA: Diagnosis not present

## 2015-06-12 DIAGNOSIS — N2581 Secondary hyperparathyroidism of renal origin: Secondary | ICD-10-CM | POA: Diagnosis not present

## 2015-06-14 ENCOUNTER — Encounter: Payer: BC Managed Care – PPO | Admitting: Surgery

## 2015-06-14 DIAGNOSIS — D631 Anemia in chronic kidney disease: Secondary | ICD-10-CM | POA: Diagnosis not present

## 2015-06-14 DIAGNOSIS — L97222 Non-pressure chronic ulcer of left calf with fat layer exposed: Secondary | ICD-10-CM | POA: Diagnosis not present

## 2015-06-14 DIAGNOSIS — L03116 Cellulitis of left lower limb: Secondary | ICD-10-CM | POA: Diagnosis not present

## 2015-06-14 DIAGNOSIS — L03115 Cellulitis of right lower limb: Secondary | ICD-10-CM | POA: Diagnosis not present

## 2015-06-14 DIAGNOSIS — Z992 Dependence on renal dialysis: Secondary | ICD-10-CM | POA: Diagnosis not present

## 2015-06-14 DIAGNOSIS — L97212 Non-pressure chronic ulcer of right calf with fat layer exposed: Secondary | ICD-10-CM | POA: Diagnosis not present

## 2015-06-14 DIAGNOSIS — N2581 Secondary hyperparathyroidism of renal origin: Secondary | ICD-10-CM | POA: Diagnosis not present

## 2015-06-14 DIAGNOSIS — N186 End stage renal disease: Secondary | ICD-10-CM | POA: Diagnosis not present

## 2015-06-14 DIAGNOSIS — E11622 Type 2 diabetes mellitus with other skin ulcer: Secondary | ICD-10-CM | POA: Diagnosis not present

## 2015-06-14 DIAGNOSIS — D509 Iron deficiency anemia, unspecified: Secondary | ICD-10-CM | POA: Diagnosis not present

## 2015-06-20 NOTE — Progress Notes (Signed)
[name] ([id number]) Visit Report for 06/14/2015 Chief Complaint Document Details Patient Name: [name], [name]. Date of Service: 06/14/2015 8:00 AM Medical Record Patient Account Number: [id number] Number: Treating RN: Huel Coventry 1955-09-12 (59 y.o. Other Clinician: Date of Birth/Sex: Female) Treating BURNS III, Primary Care Physician/Extender: Karn Cassis Physician: Referring Physician: Jones Broom in Treatment: 92 Information Obtained from: Patient Chief Complaint Bilateral calf ulcers. Electronic Signature(s) Signed: 06/14/2015 4:02:10 PM By: Madelaine Bhat MD Entered By: Madelaine Bhat on 06/14/2015 10:17:48 [name] ([id number]) -------------------------------------------------------------------------------- Debridement Details Patient Name: [name], [name] Date of Service: 06/14/2015 8:00 AM Medical Record Patient Account Number: [id number] Number: Treating RN: Huel Coventry 04/27/1956 (59 y.o. Other Clinician: Date of Birth/Sex: Female) Treating BURNS III, Primary Care Physician/Extender: Karn Cassis Physician: Referring Physician: Jones Broom in Treatment: 19 Debridement Performed for Wound #1 Right,Anterior Lower Leg Assessment: Performed By: Physician BURNS III, Melanie Crazier., MD Debridement: Debridement Pre-procedure Yes Verification/Time Out Taken: Start Time: 09:00 Pain Control: Other : lidocaine 4% Level: Skin/Subcutaneous Tissue Total Area Debrided (L x 10 (cm) x 10 (cm) = 100 (cm) W): Tissue and other Viable, Non-Viable, Exudate, Fat, Fibrin/Slough, Subcutaneous material debrided: Instrument: Curette Bleeding: Minimum Hemostasis Achieved: Pressure End Time: 09:05 Procedural Pain: 0 Post Procedural Pain: 0 Response to Treatment: Procedure was tolerated well Post Debridement Measurements of Total Wound Length: (cm) 18.8 Width: (cm)  18 Depth: (cm) 0.4 Volume: (cm) 106.311 Post Procedure Diagnosis Same as Pre-procedure Electronic Signature(s) Signed: 06/14/2015 4:02:10 PM By: Madelaine Bhat MD Signed: 06/19/2015 5:12:08 PM By: Elliot Gurney RN, BSN, Kim RN, BSN Entered By: Madelaine Bhat on 06/14/2015 10:12:14 [name] ([id number]) [name] ([id number]) -------------------------------------------------------------------------------- Debridement Details Patient Name: [name], [name] Date of Service: 06/14/2015 8:00 AM Medical Record Patient Account Number: [id number] Number: Treating RN: Huel Coventry [birth year] (59 y.o. Other Clinician: Date of Birth/Sex: Female) Treating BURNS III, Primary Care Physician/Extender: Karn Cassis Physician: Referring Physician: Jones Broom in Treatment: 19 Debridement Performed for Wound #3 Left,Lateral Lower Leg Assessment: Performed By: Physician BURNS III, Melanie Crazier., MD Debridement: Debridement Pre-procedure Yes Verification/Time Out Taken: Start Time: 09:00 Pain Control: Other : lidocaine 4% Level: Skin/Subcutaneous Tissue Total Area Debrided (L x 10 (cm) x 5 (cm) = 50 (cm) W): Tissue and other Viable, Non-Viable, Exudate, Fat, Fibrin/Slough, Subcutaneous material debrided: Instrument: Curette Bleeding: Minimum Hemostasis Achieved: Pressure End Time: 09:02 Procedural Pain: 0 Post Procedural Pain: 0 Response to Treatment: Procedure was tolerated well Post Debridement Measurements of Total Wound Length: (cm) 27 Width: (cm) 17 Depth: (cm) 1.4 Volume: (cm) 504.697 Post Procedure Diagnosis Same as Pre-procedure Electronic Signature(s) Signed: 06/14/2015 4:02:10 PM By: Madelaine Bhat MD Signed: 06/19/2015 5:12:08 PM By: Elliot Gurney RN, BSN, Kim RN, BSN Entered By: Madelaine Bhat on 06/14/2015 10:14:18 [name] ([id number]) [name]  ([id number]) -------------------------------------------------------------------------------- Debridement Details Patient Name: [name], [name] Date of Service: 06/14/2015 8:00 AM Medical Record Patient Account Number: [id number] Number: Treating RN: Huel Coventry September 08, 1955 (59 y.o. Other Clinician: Date of Birth/Sex: Female) Treating BURNS III, Primary Care Physician/Extender: Karn Cassis Physician: Referring Physician: Jones Broom in Treatment: 19 Debridement Performed for Wound #8 Right,Posterior Lower Leg Assessment: Performed By: Physician BURNS III, Melanie Crazier., MD Debridement: Debridement Pre-procedure Yes Verification/Time Out Taken: Start Time: 09:00 Pain Control: Other : lidocaine 4% Level: Skin/Subcutaneous Tissue Total Area Debrided (L x 10 (cm)  x 5 (cm) = 50 (cm) W): Tissue and other Viable, Non-Viable, Exudate, Fat, Fibrin/Slough, Subcutaneous material debrided: Instrument: Curette Bleeding: Minimum Hemostasis Achieved: Pressure End Time: 09:02 Procedural Pain: 0 Post Procedural Pain: 0 Response to Treatment: Procedure was tolerated well Post Debridement Measurements of Total Wound Length: (cm) 22.5 Width: (cm) 16.8 Depth: (cm) 0.4 Volume: (cm) 118.752 Post Procedure Diagnosis Same as Pre-procedure Electronic Signature(s) Signed: 06/14/2015 4:02:10 PM By: Madelaine Bhat MD Signed: 06/19/2015 5:12:08 PM By: Elliot Gurney RN, BSN, Kim RN, BSN Entered By: Madelaine Bhat on 06/14/2015 10:16:05 [name] ([id number]) [name] ([id number]) -------------------------------------------------------------------------------- Debridement Details Patient Name: [name], [name] Date of Service: 06/14/2015 8:00 AM Medical Record Patient Account Number: [id number] Number: Treating RN: Huel Coventry 1956/04/14 (59 y.o. Other Clinician: Date of Birth/Sex: Female) Treating BURNS III, Primary Care  Physician/Extender: Karn Cassis Physician: Referring Physician: Jones Broom in Treatment: 19 Debridement Performed for Wound #9 Left,Posterior Lower Leg Assessment: Performed By: Physician BURNS III, Melanie Crazier., MD Debridement: Debridement Pre-procedure Yes Verification/Time Out Taken: Start Time: 09:00 Pain Control: Other : lidocaine 4% Level: Skin/Subcutaneous Tissue Total Area Debrided (L x 3 (cm) x 3.8 (cm) = 11.4 (cm) W): Tissue and other Viable, Non-Viable, Exudate, Fat, Fibrin/Slough, Subcutaneous material debrided: Instrument: Curette Bleeding: Minimum Hemostasis Achieved: Pressure End Time: 09:02 Procedural Pain: 0 Post Procedural Pain: 0 Response to Treatment: Procedure was tolerated well Post Debridement Measurements of Total Wound Length: (cm) 3 Width: (cm) 3.8 Depth: (cm) 1.8 Volume: (cm) 16.116 Post Procedure Diagnosis Same as Pre-procedure Electronic Signature(s) Signed: 06/14/2015 4:02:10 PM By: Madelaine Bhat MD Signed: 06/19/2015 5:12:08 PM By: Elliot Gurney RN, BSN, Kim RN, BSN Entered By: Madelaine Bhat on 06/14/2015 10:17:00 Wahab, Aaron [name] ([id number]) Thaxton, Deneane H. ([id number]) -------------------------------------------------------------------------------- HPI Details Patient Name: [name], [name] Date of Service: 06/14/2015 8:00 AM Medical Record Patient Account Number: [id number] Number: Treating RN: Huel Coventry 1956-02-16 (59 y.o. Other Clinician: Date of Birth/Sex: Female) Treating BURNS III, Primary Care Physician/Extender: Karn Cassis Physician: Referring Physician: Jones Broom in Treatment: 19 History of Present Illness HPI Description: 58 year old with h/o DM (Hgb A1c 7.4 in Aug 2016), ESRD (on hemodialysis since July 2016). Presented to her PCP Dr. Venora Maples for BLE ulcers since June 2016. She was taken to the OR on 03/13/2015 for a angioplasty of  her left arm AV fistula. No lower extremity debridement or biopsies. 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 No record of venous US to assess for venous insufficiency. Patient has declined [place]. Culture 04/19/2015 grew methicillin sensitive staph aureus (sensitive to tetracycline), Stenotrophomonas maltophilia, and Enterococcus faecalis. Completed doxycycline. Recently hospitalized for worsening cellulitis. Treated with IV antibiotics. No operative debridement or biopsy. Performing dressing changes with silver alginate. Tolerating Profore light bilaterally. Declined punch biopsy. She returns to clinic and reports that her legs feel better. Awaiting home health to perform compression bandage changes twice weekly. She has developed a stage II sacral pressure ulcer. Has a hospital bed now. No fever or chills. Less drainage. Electronic Signature(s) Signed: 06/14/2015 4:02:10 PM By: Madelaine Bhat MD Entered By: Madelaine Bhat on 06/14/2015 10:19:13 Hubka, Aaron [name] ([id number]) -------------------------------------------------------------------------------- Physical Exam Details Patient Name: [name], [name] Date of Service: 06/14/2015 8:00 AM Medical Record Patient Account Number: [id number] Number: Treating RN: Huel Coventry July 15, 1956 (59 y.o. Other Clinician: Date of Birth/Sex:  Female) Treating BURNS III, Primary Care Physician/Extender: Karn Cassis Physician: Referring Physician: Jones Broom in Treatment: 19 Constitutional . Pulse regular. Respirations normal and unlabored. Afebrile. . Notes Bilateral calf ulcerations improved since hospitalization. No significant cellulitis. Less necrotic tissue. Persistent 2+ pitting edema. Faintly palpable DP bilaterally. Triphasic on the right. Biphasic on the  left. Noncompressible. Electronic Signature(s) Signed: 06/14/2015 4:02:10 PM By: Madelaine Bhat MD Entered By: Madelaine Bhat on 06/14/2015 10:28:11 Latorre, Aaron [name] ([id number]) -------------------------------------------------------------------------------- Physician Orders Details Patient Name: [name], [name] Date of Service: 06/14/2015 8:00 AM Medical Record Patient Account Number: [id number] Number: Treating RN: Curtis Sites September 16, 1955 (59 y.o. Other Clinician: Date of Birth/Sex: Female) Treating BURNS III, Primary Care Physician/Extender: Karn Cassis Physician: Referring Physician: Jones Broom in Treatment: 24 Verbal / Phone Orders: Yes Clinician: Curtis Sites Read Back and Verified: Yes Diagnosis Coding Wound Cleansing Wound #1 Right,Anterior Lower Leg o Clean wound with Normal Saline. o Cleanse wound with mild soap and water o May Shower, gently pat wound dry prior to applying new dressing. Wound #10 Left Gluteus o Clean wound with Normal Saline. o Cleanse wound with mild soap and water o May Shower, gently pat wound dry prior to applying new dressing. Wound #11 Right Gluteus o Clean wound with Normal Saline. o Cleanse wound with mild soap and water o May Shower, gently pat wound dry prior to applying new dressing. Wound #3 Left,Lateral Lower Leg o Clean wound with Normal Saline. o Cleanse wound with mild soap and water o May Shower, gently pat wound dry prior to applying new dressing. Wound #8 Right,Posterior Lower Leg o Clean wound with Normal Saline. o Cleanse wound with mild soap and water o May Shower, gently pat wound dry prior to applying new dressing. Wound #9 Left,Posterior Lower Leg o Clean wound with Normal Saline. o Cleanse wound with mild soap and water o May Shower, gently pat wound dry prior to applying new dressing. Anesthetic Wound #1 Right,Anterior  Lower Leg o Topical Lidocaine 4% cream applied to wound bed prior to debridement [name], [name] ([id number]) Wound #10 Left Gluteus o Topical Lidocaine 4% cream applied to wound bed prior to debridement Wound #11 Right Gluteus o Topical Lidocaine 4% cream applied to wound bed prior to debridement Wound #3 Left,Lateral Lower Leg o Topical Lidocaine 4% cream applied to wound bed prior to debridement Wound #8 Right,Posterior Lower Leg o Topical Lidocaine 4% cream applied to wound bed prior to debridement Wound #9 Left,Posterior Lower Leg o Topical Lidocaine 4% cream applied to wound bed prior to debridement Skin Barriers/Peri-Wound Care Wound #10 Left Gluteus o Barrier cream Wound #11 Right Gluteus o Barrier cream Primary Wound Dressing Wound #1 Right,Anterior Lower Leg o Aquacel Ag - Or Equivalent Wound #3 Left,Lateral Lower Leg o Aquacel Ag - Or Equivalent Wound #8 Right,Posterior Lower Leg o Aquacel Ag - Or Equivalent Wound #9 Left,Posterior Lower Leg o Aquacel Ag - Or Equivalent Secondary Dressing Wound #1 Right,Anterior Lower Leg o XtraSorb - Maxisorb or Equivalent for absorption Wound #10 Left Gluteus o XtraSorb - Maxisorb or Equivalent for absorption Wound #11 Right Gluteus o XtraSorb - Maxisorb or Equivalent for absorption Wound #3 Left,Lateral Lower Leg o XtraSorb - Maxisorb or Equivalent for absorption [name], [name] ([id number]) Wound #8 Right,Posterior Lower Leg o XtraSorb - Maxisorb or Equivalent for absorption Wound #9 Left,Posterior Lower Leg o XtraSorb - Maxisorb or Equivalent for absorption Dressing Change Frequency Wound #1 Right,Anterior  Lower Leg o Change Dressing Monday, Wednesday, Friday - Wednesday in wound care center Wound #10 Left Gluteus o Change Dressing Monday, Wednesday, Friday - Wednesday in wound care center Wound #11 Right Gluteus o Change Dressing Monday, Wednesday, Friday - Wednesday in  wound care center Wound #3 Left,Lateral Lower Leg o Change Dressing Monday, Wednesday, Friday - Wednesday in wound care center Wound #8 Right,Posterior Lower Leg o Change Dressing Monday, Wednesday, Friday - Wednesday in wound care center Wound #9 Left,Posterior Lower Leg o Change Dressing Monday, Wednesday, Friday - Wednesday in wound care center Follow-up Appointments Wound #1 Right,Anterior Lower Leg o Return Appointment in 1 week. Wound #10 Left Gluteus o Return Appointment in 1 week. Wound #11 Right Gluteus o Return Appointment in 1 week. Wound #3 Left,Lateral Lower Leg o Return Appointment in 1 week. Wound #8 Right,Posterior Lower Leg o Return Appointment in 1 week. Wound #9 Left,Posterior Lower Leg o Return Appointment in 1 week. Edema Control Wound #1 Right,Anterior Lower Leg o 3 Layer Compression System - Bilateral - Profore lite Cure, Salma H. ([id number]) o Elevate legs to the level of the heart and pump ankles as often as possible - Elevate legs at Dialysis Wound #3 Left,Lateral Lower Leg o 3 Layer Compression System - Bilateral - Profore lite o Elevate legs to the level of the heart and pump ankles as often as possible - Elevate legs at Dialysis Wound #8 Right,Posterior Lower Leg o 3 Layer Compression System - Bilateral - Profore lite o Elevate legs to the level of the heart and pump ankles as often as possible - Elevate legs at Dialysis Wound #9 Left,Posterior Lower Leg o 3 Layer Compression System - Bilateral - Profore lite o Elevate legs to the level of the heart and pump ankles as often as possible - Elevate legs at Dialysis Off-Loading Wound #10 Left Gluteus o Turn and reposition every 2 hours Wound #11 Right Gluteus o Turn and reposition every 2 hours Home Health Wound #1 Right,Anterior Lower Leg o Continue Home Health Visits - Advanced: Monday, Wednesday and Friday **WRAP GOES FROM BASE OF TOES TO THREE  FINGER WIDTHS BENEATH BEND IN KNEE. o Home Health Nurse may visit PRN to address patientos wound care needs. o FACE TO FACE ENCOUNTER: MEDICARE and MEDICAID PATIENTS: I certify that this patient is under my care and that I had a face-to-face encounter that meets the physician face-to-face encounter requirements with this patient on this date. The encounter with the patient was in whole or in part for the following MEDICAL CONDITION: (primary reason for Home Healthcare) MEDICAL NECESSITY: I certify, that based on my findings, NURSING services are a medically necessary home health service. HOME BOUND STATUS: I certify that my clinical findings support that this patient is homebound (i.e., Due to illness or injury, pt requires aid of supportive devices such as crutches, cane, wheelchairs, walkers, the use of special transportation or the assistance of another person to leave their place of residence. There is a normal inability to leave the home and doing so requires considerable and taxing effort. Other absences are for medical reasons / religious services and are infrequent or of short duration when for other reasons). o If current dressing causes regression in wound condition, may [name]/C ordered dressing product/s and apply Normal Saline Moist Dressing daily until next Wound Healing Center / Other MD appointment. Notify Wound Healing Center of regression in wound condition at [phone]. o Please direct any NON-WOUND related issues/requests for orders to patient's Primary  Care Physician Wound #10 Left Gluteus POSIE, LILLIBRIDGE ([id number]) o Continue Home Health Visits - Advanced: Monday, Wednesday and Friday **WRAP GOES FROM BASE OF TOES TO THREE FINGER WIDTHS BENEATH BEND IN KNEE. o Home Health Nurse may visit PRN to address patientos wound care needs. o FACE TO FACE ENCOUNTER: MEDICARE and MEDICAID PATIENTS: I certify that this patient is under my care and that I had a  face-to-face encounter that meets the physician face-to-face encounter requirements with this patient on this date. The encounter with the patient was in whole or in part for the following MEDICAL CONDITION: (primary reason for Home Healthcare) MEDICAL NECESSITY: I certify, that based on my findings, NURSING services are a medically necessary home health service. HOME BOUND STATUS: I certify that my clinical findings support that this patient is homebound (i.e., Due to illness or injury, pt requires aid of supportive devices such as crutches, cane, wheelchairs, walkers, the use of special transportation or the assistance of another person to leave their place of residence. There is a normal inability to leave the home and doing so requires considerable and taxing effort. Other absences are for medical reasons / religious services and are infrequent or of short duration when for other reasons). o If current dressing causes regression in wound condition, may [name]/C ordered dressing product/s and apply Normal Saline Moist Dressing daily until next Wound Healing Center / Other MD appointment. Notify Wound Healing Center of regression in wound condition at [phone]. o Please direct any NON-WOUND related issues/requests for orders to patient's Primary Care Physician Wound #11 Right Gluteus o Continue Home Health Visits - Advanced: Monday, Wednesday and Friday **WRAP GOES FROM BASE OF TOES TO THREE FINGER WIDTHS BENEATH BEND IN KNEE. o Home Health Nurse may visit PRN to address patientos wound care needs. o FACE TO FACE ENCOUNTER: MEDICARE and MEDICAID PATIENTS: I certify that this patient is under my care and that I had a face-to-face encounter that meets the physician face-to-face encounter requirements with this patient on this date. The encounter with the patient was in whole or in part for the following MEDICAL CONDITION: (primary reason for Home Healthcare) MEDICAL NECESSITY: I  certify, that based on my findings, NURSING services are a medically necessary home health service. HOME BOUND STATUS: I certify that my clinical findings support that this patient is homebound (i.e., Due to illness or injury, pt requires aid of supportive devices such as crutches, cane, wheelchairs, walkers, the use of special transportation or the assistance of another person to leave their place of residence. There is a normal inability to leave the home and doing so requires considerable and taxing effort. Other absences are for medical reasons / religious services and are infrequent or of short duration when for other reasons). o If current dressing causes regression in wound condition, may [name]/C ordered dressing product/s and apply Normal Saline Moist Dressing daily until next Wound Healing Center / Other MD appointment. Notify Wound Healing Center of regression in wound condition at [phone]. o Please direct any NON-WOUND related issues/requests for orders to patient's Primary Care Physician Wound #3 Left,Lateral Lower Leg o Continue Home Health Visits - Advanced: Monday, Wednesday and Friday **WRAP GOES FROM BASE OF TOES TO THREE FINGER WIDTHS BENEATH BEND IN KNEE. o Home Health Nurse may visit PRN to address patientos wound care needs. o FACE TO FACE ENCOUNTER: MEDICARE and MEDICAID PATIENTS: I certify that this patient is under my care and that I had a face-to-face encounter that meets  the physician face-to-face encounter requirements with this patient on this date. The encounter with the patient was in [facility], Ysabel H. ([id number]) whole or in part for the following MEDICAL CONDITION: (primary reason for Home Healthcare) MEDICAL NECESSITY: I certify, that based on my findings, NURSING services are a medically necessary home health service. HOME BOUND STATUS: I certify that my clinical findings support that this patient is homebound (i.e., Due to illness or injury, pt  requires aid of supportive devices such as crutches, cane, wheelchairs, walkers, the use of special transportation or the assistance of another person to leave their place of residence. There is a normal inability to leave the home and doing so requires considerable and taxing effort. Other absences are for medical reasons / religious services and are infrequent or of short duration when for other reasons). o If current dressing causes regression in wound condition, may [name]/C ordered dressing product/s and apply Normal Saline Moist Dressing daily until next Wound Healing Center / Other MD appointment. Notify Wound Healing Center of regression in wound condition at [phone]. o Please direct any NON-WOUND related issues/requests for orders to patient's Primary Care Physician Wound #8 Right,Posterior Lower Leg o Continue Home Health Visits - Advanced: Monday, Wednesday and Friday **WRAP GOES FROM BASE OF TOES TO THREE FINGER WIDTHS BENEATH BEND IN KNEE. o Home Health Nurse may visit PRN to address patientos wound care needs. o FACE TO FACE ENCOUNTER: MEDICARE and MEDICAID PATIENTS: I certify that this patient is under my care and that I had a face-to-face encounter that meets the physician face-to-face encounter requirements with this patient on this date. The encounter with the patient was in whole or in part for the following MEDICAL CONDITION: (primary reason for Home Healthcare) MEDICAL NECESSITY: I certify, that based on my findings, NURSING services are a medically necessary home health service. HOME BOUND STATUS: I certify that my clinical findings support that this patient is homebound (i.e., Due to illness or injury, pt requires aid of supportive devices such as crutches, cane, wheelchairs, walkers, the use of special transportation or the assistance of another person to leave their place of residence. There is a normal inability to leave the home and doing so requires  considerable and taxing effort. Other absences are for medical reasons / religious services and are infrequent or of short duration when for other reasons). o If current dressing causes regression in wound condition, may [name]/C ordered dressing product/s and apply Normal Saline Moist Dressing daily until next Wound Healing Center / Other MD appointment. Notify Wound Healing Center of regression in wound condition at [phone]. o Please direct any NON-WOUND related issues/requests for orders to patient's Primary Care Physician Wound #9 Left,Posterior Lower Leg o Continue Home Health Visits - Advanced: Monday, Wednesday and Friday **WRAP GOES FROM BASE OF TOES TO THREE FINGER WIDTHS BENEATH BEND IN KNEE. o Home Health Nurse may visit PRN to address patientos wound care needs. o FACE TO FACE ENCOUNTER: MEDICARE and MEDICAID PATIENTS: I certify that this patient is under my care and that I had a face-to-face encounter that meets the physician face-to-face encounter requirements with this patient on this date. The encounter with the patient was in whole or in part for the following MEDICAL CONDITION: (primary reason for Home Healthcare) MEDICAL NECESSITY: I certify, that based on my findings, NURSING services are a medically necessary home health service. HOME BOUND STATUS: I certify that my clinical findings support that this patient is homebound (i.e., Due to illness or  injury, pt requires aid of supportive devices such as crutches, cane, wheelchairs, walkers, the use of special transportation or the assistance of another person to leave their place of residence. There is a Faughn, Daley H. ([id number]) normal inability to leave the home and doing so requires considerable and taxing effort. Other absences are for medical reasons / religious services and are infrequent or of short duration when for other reasons). o If current dressing causes regression in wound condition, may [name]/C  ordered dressing product/s and apply Normal Saline Moist Dressing daily until next Wound Healing Center / Other MD appointment. Notify Wound Healing Center of regression in wound condition at [phone]. o Please direct any NON-WOUND related issues/requests for orders to patient's Primary Care Physician Electronic Signature(s) Signed: 06/14/2015 4:02:10 PM By: Madelaine Bhat MD Signed: 06/14/2015 5:54:02 PM By: Curtis Sites Entered By: Curtis Sites on 06/14/2015 09:08:31 Garbett, Adriella Rexene [name] ([id number]) -------------------------------------------------------------------------------- Problem List Details Patient Name: Welch, Deanndra H. Date of Service: 06/14/2015 8:00 AM Medical Record Patient Account Number: [id number] Number: Treating RN: Huel Coventry 09/24/1955 (59 y.o. 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/30/2015 Yes exposed L97.212 Non-pressure chronic ulcer of right calf with fat layer 01/30/2015 Yes exposed R60.0 Localized edema 04/19/2015 Yes L89.152 Pressure ulcer of sacral region, stage 2 06/07/2015 Yes Inactive Problems Resolved Problems ICD-10 Code Description Active Date Resolved Date L03.115 Cellulitis of right lower limb 05/31/2015 05/31/2015 L03.116 Cellulitis of left lower limb 05/31/2015 05/31/2015 Farver, TERECIA PLAUT ([id number]) Electronic Signature(s) Signed: 06/14/2015 4:02:10 PM By: Madelaine Bhat MD Entered By: Madelaine Bhat on 06/14/2015 10:09:28 Lipsey, Fronia Rexene [name]  ([id number]) -------------------------------------------------------------------------------- Progress Note Details Patient Name: Ethier, Kaelah H. Date of Service: 06/14/2015 8:00 AM Medical Record Patient Account Number: [id number] Number: Treating RN: Huel Coventry 08/13/1955 (59 y.o. Other Clinician: Date of Birth/Sex: Female) Treating BURNS III, Primary Care Physician/Extender: Karn Cassis Physician: Referring Physician: Jones Broom in Treatment: 46 Subjective Chief Complaint Information obtained from Patient Bilateral calf ulcers. History of Present Illness (HPI) 59 year old with h/o DM (Hgb A1c 7.4 in Aug 2016), ESRD (on hemodialysis since July 2016). Presented to her PCP Dr. Venora Maples for BLE ulcers since June 2016. She was taken to the OR on 03/13/2015 for a angioplasty of her left arm AV fistula. No lower extremity debridement or biopsies. 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 No record of venous US to assess for venous insufficiency. Patient has declined [place]. Culture 04/19/2015 grew methicillin sensitive staph aureus (sensitive to tetracycline), Stenotrophomonas maltophilia, and Enterococcus faecalis. Completed doxycycline. Recently hospitalized for worsening cellulitis. Treated with IV antibiotics. No operative debridement or biopsy. Performing dressing changes with silver alginate. Tolerating Profore light bilaterally. Declined punch biopsy. She returns to clinic and reports that her legs feel better. Awaiting home health to perform compression bandage changes twice weekly. She has developed a stage II sacral pressure ulcer. Has a hospital bed now. No fever or chills. Less drainage. Objective Constitutional Pulse regular. Respirations normal and unlabored. Afebrile. Vitals Time Taken: 8:14 AM, Height: 68 in,  Weight: 294 lbs,  BMI: 44.7, Temperature: 98.0 F, Pulse: 79 bpm, Respiratory Rate: 18 breaths/min, Blood Pressure: 130/61 mmHg. Joost, Dewanda HMarland Kitchen ([id number]) General Notes: Bilateral calf ulcerations improved since hospitalization. No significant cellulitis. Less necrotic tissue. Persistent 2+ pitting edema. Faintly palpable DP bilaterally. Triphasic on the right. Biphasic on the left. Noncompressible. Integumentary (Hair, Skin) Wound #1 status is Open. Original cause of wound was Gradually Appeared. The wound is located on the Right,Anterior Lower Leg. The wound measures 18.8cm length x 18cm width x 0.3cm depth; 265.779cm^2 area and 79.734cm^3 volume. The wound is limited to skin breakdown. There is no tunneling or undermining noted. There is a medium amount of serous drainage noted. The wound margin is thickened. There is small (1-33%) pink granulation within the wound bed. There is a large (67-100%) amount of necrotic tissue within the wound bed. The periwound skin appearance exhibited: Scarring, Moist. The periwound skin appearance did not exhibit: Callus, Crepitus, Excoriation, Fluctuance, Friable, Induration, Localized Edema, Rash, Dry/Scaly, Maceration, Atrophie Blanche, Cyanosis, Ecchymosis, Hemosiderin Staining, Mottled, Pallor, Rubor, Erythema. The periwound has tenderness on palpation. Wound #10 status is Open. Original cause of wound was Gradually Appeared. The wound is located on the Left Gluteus. The wound measures 6.5cm length x 3.5cm width x 0.1cm depth; 17.868cm^2 area and 1.787cm^3 volume. The wound is limited to skin breakdown. There is no tunneling or undermining noted. There is a small amount of serous drainage noted. The wound margin is flat and intact. There is large (67- 100%) red granulation within the wound bed. There is no necrotic tissue within the wound bed. The periwound skin appearance did not exhibit: Callus, Crepitus, Excoriation, Fluctuance, Friable,  Induration, Localized Edema, Rash, Scarring, Dry/Scaly, Maceration, Moist, Atrophie Blanche, Cyanosis, Ecchymosis, Hemosiderin Staining, Mottled, Pallor, Rubor, Erythema. Wound #11 status is Open. Original cause of wound was Gradually Appeared. The wound is located on the Right Gluteus. The wound measures 7.5cm length x 3.5cm width x 0.1cm depth; 20.617cm^2 area and 2.062cm^3 volume. The wound is limited to skin breakdown. There is no tunneling or undermining noted. There is a none present amount of drainage noted. The wound margin is flat and intact. There is large (67- 100%) red, pink granulation within the wound bed. There is no necrotic tissue within the wound bed. The periwound skin appearance did not exhibit: Callus, Crepitus, Excoriation, Fluctuance, Friable, Induration, Localized Edema, Rash, Scarring, Dry/Scaly, Maceration, Moist, Atrophie Blanche, Cyanosis, Ecchymosis, Hemosiderin Staining, Mottled, Pallor, Rubor, Erythema. Wound #2 status is Converted. Original cause of wound was Gradually Appeared. The wound is located on the Right,Distal Lower Leg. Wound #3 status is Open. Original cause of wound was Gradually Appeared. The wound is located on the Left,Lateral Lower Leg. The wound measures 27cm length x 17cm width x 1.2cm depth; 360.498cm^2 area and 432.597cm^3 volume. The wound is limited to skin breakdown. There is no tunneling or undermining noted. There is a large amount of serous drainage noted. The wound margin is flat and intact. There is medium (34-66%) red granulation within the wound bed. There is a medium (34-66%) amount of necrotic tissue within the wound bed including Eschar and Adherent Slough. The periwound skin appearance did not exhibit: Callus, Crepitus, Excoriation, Fluctuance, Friable, Induration, Localized Edema, Rash, Scarring, Dry/Scaly, Maceration, Moist, Atrophie Blanche, Cyanosis, Ecchymosis, Hemosiderin Staining, Mottled, Pallor, Rubor, Erythema. Wound #8  status is Open. Original cause of wound was Gradually Appeared. The wound is located on the Right,Posterior Lower Leg. The wound measures 22.5cm length x 16.8cm width x 0.3cm depth; 296.881cm^2 area and 89.064cm^3  volume. The wound is limited to skin breakdown. There is no tunneling Fryberger, Adan H. ([id number]) or undermining noted. There is a large amount of serous drainage noted. The wound margin is flat and intact. There is medium (34-66%) red granulation within the wound bed. There is a medium (34-66%) amount of necrotic tissue within the wound bed including Eschar and Adherent Slough. The periwound skin appearance did not exhibit: Callus, Crepitus, Excoriation, Fluctuance, Friable, Induration, Localized Edema, Rash, Scarring, Dry/Scaly, Maceration, Moist, Atrophie Blanche, Cyanosis, Ecchymosis, Hemosiderin Staining, Mottled, Pallor, Rubor, Erythema. Wound #9 status is Open. Original cause of wound was Gradually Appeared. The wound is located on the Left,Posterior Lower Leg. The wound measures 3cm length x 3.8cm width x 1.7cm depth; 8.954cm^2 area and 15.221cm^3 volume. The wound is limited to skin breakdown. There is no tunneling or undermining noted. There is a large amount of serous drainage noted. The wound margin is flat and intact. There is medium (34-66%) red granulation within the wound bed. There is a medium (34-66%) amount of necrotic tissue within the wound bed including Eschar and Adherent Slough. The periwound skin appearance did not exhibit: Callus, Crepitus, Excoriation, Fluctuance, Friable, Induration, Localized Edema, Rash, Scarring, Dry/Scaly, Maceration, Moist, Atrophie Blanche, Cyanosis, Ecchymosis, Hemosiderin Staining, Mottled, Pallor, Rubor, Erythema. 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 There was a Skin/Subcutaneous Tissue Debridement ([id number](11042-11047) debridement with total area of 100 sq cm performed by BURNS III, Melanie CrazierWALTER W., MD. with the following instrument(s): Curette to remove Viable and Non-Viable tissue/material including Exudate, Fat, Fibrin/Slough, and Subcutaneous after achieving pain control using Other (lidocaine 4%). A time out was conducted prior to the start of the procedure. A Minimum amount of bleeding was controlled with Pressure. The procedure was tolerated well with a pain level of 0 throughout and a pain level of 0 following the procedure. Post Debridement Measurements: 18.8cm length x 18cm width x 0.4cm depth; Tristan, Tonjua H. ([id number]) 106.311cm^3 volume. 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 There was a Skin/Subcutaneous Tissue Debridement ([id number](11042-11047) debridement with total area of 50 sq cm performed by BURNS III, Melanie CrazierWALTER W., MD. with the following instrument(s): Curette to remove Viable and Non-Viable tissue/material including Exudate, Fat, Fibrin/Slough, and Subcutaneous after achieving pain control using Other (lidocaine 4%). A time out was conducted prior to the start of the procedure. A Minimum amount of bleeding was controlled with Pressure. The procedure was tolerated well with a pain level of 0 throughout and a pain level of 0 following the procedure. Post Debridement Measurements: 27cm length x 17cm width x 1.4cm depth; 504.697cm^3 volume. Post procedure Diagnosis Wound #3: Same as Pre-Procedure Wound #8 Wound #8 is a Calciphylaxis located on the  Right,Posterior Lower Leg . There was a Skin/Subcutaneous Tissue Debridement ([id number](11042-11047) debridement with total area of 50 sq cm performed by BURNS III, Melanie CrazierWALTER W., MD. with the following instrument(s): Curette to remove Viable and Non-Viable tissue/material including Exudate, Fat, Fibrin/Slough, and Subcutaneous after achieving pain control using Other (lidocaine 4%). A time  out was conducted prior to the start of the procedure. A Minimum amount of bleeding was controlled with Pressure. The procedure was tolerated well with a pain level of 0 throughout and a pain level of 0 following the procedure. Post Debridement Measurements: 22.5cm length x 16.8cm width x 0.4cm depth; 118.752cm^3 volume. 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 There was a Skin/Subcutaneous Tissue Debridement ([id number]) debridement with total area of 11.4 sq cm performed by BURNS III, Melanie Crazier., MD. with the following instrument(s): Curette to remove Viable and Non-Viable tissue/material including Exudate, Fat, Fibrin/Slough, and Subcutaneous after achieving pain control using Other (lidocaine 4%). A time out was conducted prior to the start of the procedure. A Minimum amount of bleeding was controlled with Pressure. The procedure was tolerated well with a pain level of 0 throughout and a pain level of 0 following the procedure. Post Debridement Measurements: 3cm length x 3.8cm width x 1.8cm depth; 16.116cm^3 volume. Post procedure Diagnosis Wound #9: Same as Pre-Procedure Plan Wound Cleansing: Wound #1 Right,Anterior Lower Leg: Clean wound with Normal Saline. Cleanse wound with mild soap and water Lowdermilk, Dawnyel H. ([id number]) May Shower, gently pat wound dry prior to applying new dressing. Wound #10 Left Gluteus: Clean wound with Normal Saline. Cleanse wound with mild soap and water May Shower, gently pat wound dry  prior to applying new dressing. Wound #11 Right Gluteus: Clean wound with Normal Saline. Cleanse wound with mild soap and water May Shower, gently pat wound dry prior to applying new dressing. Wound #3 Left,Lateral Lower Leg: Clean wound with Normal Saline. Cleanse wound with mild soap and water May Shower, gently pat wound dry prior to applying new dressing. Wound #8 Right,Posterior Lower Leg: Clean wound with Normal Saline. Cleanse wound with mild soap and water May Shower, gently pat wound dry prior to applying new dressing. Wound #9 Left,Posterior Lower Leg: Clean wound with Normal Saline. Cleanse wound with mild soap and water May Shower, gently pat wound dry prior to applying new dressing. Anesthetic: Wound #1 Right,Anterior Lower Leg: Topical Lidocaine 4% cream applied to wound bed prior to debridement Wound #10 Left Gluteus: Topical Lidocaine 4% cream applied to wound bed prior to debridement Wound #11 Right Gluteus: Topical Lidocaine 4% cream applied to wound bed prior to debridement Wound #3 Left,Lateral Lower Leg: Topical Lidocaine 4% cream applied to wound bed prior to debridement Wound #8 Right,Posterior Lower Leg: Topical Lidocaine 4% cream applied to wound bed prior to debridement Wound #9 Left,Posterior Lower Leg: Topical Lidocaine 4% cream applied to wound bed prior to debridement Skin Barriers/Peri-Wound Care: Wound #10 Left Gluteus: Barrier cream Wound #11 Right Gluteus: Barrier cream Primary Wound Dressing: Wound #1 Right,Anterior Lower Leg: Aquacel Ag - Or Equivalent Wound #3 Left,Lateral Lower Leg: Aquacel Ag - Or Equivalent Wound #8 Right,Posterior Lower Leg: Aquacel Ag - Or Equivalent Wound #9 Left,Posterior Lower Leg: Aquacel Ag - Or Equivalent Secondary Dressing: Wound #1 Right,Anterior Lower Leg: XtraSorb - Maxisorb or Equivalent for absorption Mogensen, Nikiyah H. ([id number]) Wound #10 Left Gluteus: XtraSorb - Maxisorb or Equivalent for  absorption Wound #11 Right Gluteus: XtraSorb - Maxisorb or Equivalent for absorption Wound #3 Left,Lateral Lower Leg: XtraSorb - Maxisorb or Equivalent for absorption Wound #8 Right,Posterior Lower Leg: XtraSorb - Maxisorb or Equivalent for absorption Wound #9 Left,Posterior Lower Leg: XtraSorb - Maxisorb or Equivalent for absorption Dressing Change Frequency: Wound #1 Right,Anterior Lower Leg: Change Dressing Monday,  Wednesday, Friday - Wednesday in wound care center Wound #10 Left Gluteus: Change Dressing Monday, Wednesday, Friday - Wednesday in wound care center Wound #11 Right Gluteus: Change Dressing Monday, Wednesday, Friday - Wednesday in wound care center Wound #3 Left,Lateral Lower Leg: Change Dressing Monday, Wednesday, Friday - Wednesday in wound care center Wound #8 Right,Posterior Lower Leg: Change Dressing Monday, Wednesday, Friday - Wednesday in wound care center Wound #9 Left,Posterior Lower Leg: Change Dressing Monday, Wednesday, Friday - Wednesday in wound care center Follow-up Appointments: Wound #1 Right,Anterior Lower Leg: Return Appointment in 1 week. Wound #10 Left Gluteus: Return Appointment in 1 week. Wound #11 Right Gluteus: Return Appointment in 1 week. Wound #3 Left,Lateral Lower Leg: Return Appointment in 1 week. Wound #8 Right,Posterior Lower Leg: Return Appointment in 1 week. Wound #9 Left,Posterior Lower Leg: Return Appointment in 1 week. Edema Control: Wound #1 Right,Anterior Lower Leg: 3 Layer Compression System - Bilateral - Profore lite Elevate legs to the level of the heart and pump ankles as often as possible - Elevate legs at Dialysis Wound #3 Left,Lateral Lower Leg: 3 Layer Compression System - Bilateral - Profore lite Elevate legs to the level of the heart and pump ankles as often as possible - Elevate legs at Dialysis Wound #8 Right,Posterior Lower Leg: 3 Layer Compression System - Bilateral - Profore lite Elevate legs to the level  of the heart and pump ankles as often as possible - Elevate legs at Dialysis Wound #9 Left,Posterior Lower Leg: 3 Layer Compression System - Bilateral - Profore lite Elevate legs to the level of the heart and pump ankles as often as possible - Elevate legs at Dialysis Off-Loading: Wound #10 Left Gluteus: Denslow, Bionca H. ([id number]) Turn and reposition every 2 hours Wound #11 Right Gluteus: Turn and reposition every 2 hours Home Health: Wound #1 Right,Anterior Lower Leg: Continue Home Health Visits - [facility]: Monday, Wednesday and Friday **WRAP GOES FROM BASE OF TOES TO THREE FINGER WIDTHS BENEATH BEND IN KNEE. Home Health Nurse may visit PRN to address patient s wound care needs. FACE TO FACE ENCOUNTER: MEDICARE and MEDICAID PATIENTS: I certify that this patient is under my care and that I had a face-to-face encounter that meets the physician face-to-face encounter requirements with this patient on this date. The encounter with the patient was in whole or in part for the following MEDICAL CONDITION: (primary reason for Home Healthcare) MEDICAL NECESSITY: I certify, that based on my findings, NURSING services are a medically necessary home health service. HOME BOUND STATUS: I certify that my clinical findings support that this patient is homebound (i.e., Due to illness or injury, pt requires aid of supportive devices such as crutches, cane, wheelchairs, walkers, the use of special transportation or the assistance of another person to leave their place of residence. There is a normal inability to leave the home and doing so requires considerable and taxing effort. Other absences are for medical reasons / religious services and are infrequent or of short duration when for other reasons). If current dressing causes regression in wound condition, may [name]/C ordered dressing product/s and apply Normal Saline Moist Dressing daily until next Wound Healing Center / Other MD appointment. Notify  Wound Healing Center of regression in wound condition at [phone]. Please direct any NON-WOUND related issues/requests for orders to patient's Primary Care Physician Wound #10 Left Gluteus: Continue Home Health Visits - [facility]: Monday, Wednesday and Friday **WRAP GOES FROM BASE OF TOES TO THREE FINGER WIDTHS BENEATH BEND IN KNEE. Home  Health Nurse may visit PRN to address patient s wound care needs. FACE TO FACE ENCOUNTER: MEDICARE and MEDICAID PATIENTS: I certify that this patient is under my care and that I had a face-to-face encounter that meets the physician face-to-face encounter requirements with this patient on this date. The encounter with the patient was in whole or in part for the following MEDICAL CONDITION: (primary reason for Home Healthcare) MEDICAL NECESSITY: I certify, that based on my findings, NURSING services are a medically necessary home health service. HOME BOUND STATUS: I certify that my clinical findings support that this patient is homebound (i.e., Due to illness or injury, pt requires aid of supportive devices such as crutches, cane, wheelchairs, walkers, the use of special transportation or the assistance of another person to leave their place of residence. There is a normal inability to leave the home and doing so requires considerable and taxing effort. Other absences are for medical reasons / religious services and are infrequent or of short duration when for other reasons). If current dressing causes regression in wound condition, may [name]/C ordered dressing product/s and apply Normal Saline Moist Dressing daily until next Wound Healing Center / Other MD appointment. Notify Wound Healing Center of regression in wound condition at [phone]. Please direct any NON-WOUND related issues/requests for orders to patient's Primary Care Physician Wound #11 Right Gluteus: Continue Home Health Visits - [facility]: Monday, Wednesday and Friday **WRAP GOES FROM BASE OF  TOES TO THREE FINGER WIDTHS BENEATH BEND IN KNEE. Home Health Nurse may visit PRN to address patient s wound care needs. FACE TO FACE ENCOUNTER: MEDICARE and MEDICAID PATIENTS: I certify that this patient is under my care and that I had a face-to-face encounter that meets the physician face-to-face encounter requirements with this patient on this date. The encounter with the patient was in whole or in part for the following MEDICAL CONDITION: (primary reason for Home Healthcare) MEDICAL NECESSITY: I certify, that based on my findings, NURSING services are a medically necessary home health service. HOME BOUND STATUS: I certify that my clinical findings support that this patient is homebound (i.e., Due to illness or injury, pt requires aid of supportive devices such as crutches, cane, wheelchairs, walkers, the use Mchargue, Vernette H. ([id number]) of special transportation or the assistance of another person to leave their place of residence. There is a normal inability to leave the home and doing so requires considerable and taxing effort. Other absences are for medical reasons / religious services and are infrequent or of short duration when for other reasons). If current dressing causes regression in wound condition, may [name]/C ordered dressing product/s and apply Normal Saline Moist Dressing daily until next Wound Healing Center / Other MD appointment. Notify Wound Healing Center of regression in wound condition at [phone]. Please direct any NON-WOUND related issues/requests for orders to patient's Primary Care Physician Wound #3 Left,Lateral Lower Leg: Continue Home Health Visits - [facility]: Monday, Wednesday and Friday **WRAP GOES FROM BASE OF TOES TO THREE FINGER WIDTHS BENEATH BEND IN KNEE. Home Health Nurse may visit PRN to address patient s wound care needs. FACE TO FACE ENCOUNTER: MEDICARE and MEDICAID PATIENTS: I certify that this patient is under my care and that I had a face-to-face  encounter that meets the physician face-to-face encounter requirements with this patient on this date. The encounter with the patient was in whole or in part for the following MEDICAL CONDITION: (primary reason for Home Healthcare) MEDICAL NECESSITY: I certify, that based on my findings,  NURSING services are a medically necessary home health service. HOME BOUND STATUS: I certify that my clinical findings support that this patient is homebound (i.e., Due to illness or injury, pt requires aid of supportive devices such as crutches, cane, wheelchairs, walkers, the use of special transportation or the assistance of another person to leave their place of residence. There is a normal inability to leave the home and doing so requires considerable and taxing effort. Other absences are for medical reasons / religious services and are infrequent or of short duration when for other reasons). If current dressing causes regression in wound condition, may [name]/C ordered dressing product/s and apply Normal Saline Moist Dressing daily until next Wound Healing Center / Other MD appointment. Notify Wound Healing Center of regression in wound condition at [phone]. Please direct any NON-WOUND related issues/requests for orders to patient's Primary Care Physician Wound #8 Right,Posterior Lower Leg: Continue Home Health Visits - [facility]: Monday, Wednesday and Friday **WRAP GOES FROM BASE OF TOES TO THREE FINGER WIDTHS BENEATH BEND IN KNEE. Home Health Nurse may visit PRN to address patient s wound care needs. FACE TO FACE ENCOUNTER: MEDICARE and MEDICAID PATIENTS: I certify that this patient is under my care and that I had a face-to-face encounter that meets the physician face-to-face encounter requirements with this patient on this date. The encounter with the patient was in whole or in part for the following MEDICAL CONDITION: (primary reason for Home Healthcare) MEDICAL NECESSITY: I certify, that based on my  findings, NURSING services are a medically necessary home health service. HOME BOUND STATUS: I certify that my clinical findings support that this patient is homebound (i.e., Due to illness or injury, pt requires aid of supportive devices such as crutches, cane, wheelchairs, walkers, the use of special transportation or the assistance of another person to leave their place of residence. There is a normal inability to leave the home and doing so requires considerable and taxing effort. Other absences are for medical reasons / religious services and are infrequent or of short duration when for other reasons). If current dressing causes regression in wound condition, may [name]/C ordered dressing product/s and apply Normal Saline Moist Dressing daily until next Wound Healing Center / Other MD appointment. Notify Wound Healing Center of regression in wound condition at [phone]. Please direct any NON-WOUND related issues/requests for orders to patient's Primary Care Physician Wound #9 Left,Posterior Lower Leg: Continue Home Health Visits - [facility]: Monday, Wednesday and Friday **WRAP GOES FROM BASE OF TOES TO THREE FINGER WIDTHS BENEATH BEND IN KNEE. Home Health Nurse may visit [facility] to address patient s wound care needs. FACE TO FACE ENCOUNTER: MEDICARE and MEDICAID PATIENTS: I certify that this patient is under my care and that I had a face-to-face encounter that meets the physician face-to-face encounter requirements with this patient on this date. The encounter with the patient was in whole or in part for the following MEDICAL CONDITION: (primary reason for Home Healthcare) MEDICAL NECESSITY: I certify, Barto, Katherina H. ([id number]) that based on my findings, NURSING services are a medically necessary home health service. HOME BOUND STATUS: I certify that my clinical findings support that this patient is homebound (i.e., Due to illness or injury, pt requires aid of supportive devices such as  crutches, cane, wheelchairs, walkers, the use of special transportation or the assistance of another person to leave their place of residence. There is a normal inability to leave the home and doing so requires considerable and taxing effort. Other absences  are for medical reasons / religious services and are infrequent or of short duration when for other reasons). If current dressing causes regression in wound condition, may [name]/C ordered dressing product/s and apply Normal Saline Moist Dressing daily until next Wound Healing Center / Other MD appointment. Notify Wound Healing Center of regression in wound condition at [phone]. Please direct any NON-WOUND related issues/requests for orders to patient's Primary Care Physician Continue with silver alginate dressings and 3 layer elastic compression bandage. Awaiting home health to start performing dressing changes twice weekly. Electronic Signature(s) Signed: 06/14/2015 4:02:10 PM By: Madelaine Bhat MD Entered By: Madelaine Bhat on 06/14/2015 10:35:22 Inman, Aaron [name] ([id number]) -------------------------------------------------------------------------------- SuperBill Details Patient Name: Badour, Rolando H. Date of Service: 06/14/2015 Medical Record Patient Account Number: [id number] Number: Treating RN: Huel Coventry 1956/01/15 (59 y.o. 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 R60.0 Localized edema L89.152 Pressure ulcer of sacral region, stage 2 Facility Procedures CPT4 Code:  [id number] Description: 11042 - DEB SUBQ TISSUE 20 SQ CM/< ICD-10 Description Diagnosis L97.222 Non-pressure chronic ulcer of left calf with fat l L97.212 Non-pressure chronic ulcer of right calf with fat Modifier: ayer exposed layer exposed Quantity: 1 CPT4 Code: [id number] Description: 11045 - DEB SUBQ TISS EA ADDL 20CM ICD-10 Description Diagnosis L97.222 Non-pressure chronic ulcer of left calf with fat l L97.212 Non-pressure chronic ulcer of right calf with fat Modifier: ayer exposed layer exposed Quantity: 10 Physician Procedures CPT4 Code: [id number] Description: 11042 - WC PHYS SUBQ TISS 20 SQ CM ICD-10 Description Diagnosis L97.222 Non-pressure chronic ulcer of left calf with fat la L97.212 Non-pressure chronic ulcer of right calf with fat l Modifier: yer exposed ayer exposed Quantity: 1 CPT4 Code: [id number] Prowse, PAM Description: 11045 - WC PHYS SUBQ TISS EA ADDL 20 CM ELA H. ([id number]) Modifier: Quantity: 10 Electronic Signature(s) Signed: 06/14/2015 4:02:10 PM By: Madelaine Bhat MD Entered By: Madelaine Bhat on 06/14/2015 10:36:25

## 2015-06-21 ENCOUNTER — Encounter: Payer: BC Managed Care – PPO | Attending: Surgery | Admitting: Surgery

## 2015-06-21 DIAGNOSIS — E11622 Type 2 diabetes mellitus with other skin ulcer: Secondary | ICD-10-CM | POA: Insufficient documentation

## 2015-06-21 DIAGNOSIS — R600 Localized edema: Secondary | ICD-10-CM | POA: Diagnosis not present

## 2015-06-21 DIAGNOSIS — L97212 Non-pressure chronic ulcer of right calf with fat layer exposed: Secondary | ICD-10-CM | POA: Diagnosis not present

## 2015-06-21 DIAGNOSIS — Z992 Dependence on renal dialysis: Secondary | ICD-10-CM | POA: Insufficient documentation

## 2015-06-21 DIAGNOSIS — L89152 Pressure ulcer of sacral region, stage 2: Secondary | ICD-10-CM | POA: Insufficient documentation

## 2015-06-21 DIAGNOSIS — L97222 Non-pressure chronic ulcer of left calf with fat layer exposed: Secondary | ICD-10-CM | POA: Diagnosis not present

## 2015-06-21 DIAGNOSIS — N186 End stage renal disease: Secondary | ICD-10-CM | POA: Diagnosis not present

## 2015-06-21 NOTE — Progress Notes (Signed)
[name], [name] ([id number]) Visit Report for 06/14/2015 Arrival Information Details Patient Name: [name], [name]. Date of Service: 06/14/2015 8:00 AM Medical Record Patient Account Number: [id number] Number: Treating RN: Phillis Haggis 1955/09/22 (59 y.o. Other Clinician: Date of Birth/Sex: Female) Treating BURNS III, Primary Care Physician: Fidel Levy Physician/Extender: Zollie Beckers Referring Physician: Jones Broom in Treatment: 19 Visit Information History Since Last Visit All ordered tests and consults were completed: No Patient Arrived: Ambulatory Added or deleted any medications: No Arrival Time: 08:13 Any new allergies or adverse reactions: No Accompanied By: self Had a fall or experienced change in No Transfer Assistance: None activities of daily living that may affect Patient Identification Verified: Yes risk of falls: Secondary Verification Process Yes Signs or symptoms of abuse/neglect since last No Completed: visito Patient Requires Transmission- No Hospitalized since last visit: No Based Precautions: Pain Present Now: No Patient Has Alerts: Yes Patient Alerts: DMII ABI [place] Bilateral Electronic Signature(s) Signed: 06/20/2015 6:00:56 PM By: Alejandro Mulling Entered By: Alejandro Mulling on 06/14/2015 08:13:55 Chestnutt, Aralynn H. ([id number]) -------------------------------------------------------------------------------- Lower Extremity Assessment Details Patient Name: [name], [name] Date of Service: 06/14/2015 8:00 AM Medical Record Patient Account Number: [id number] Number: Treating RN: Phillis Haggis 01-15-1956 (59 y.o. Other Clinician: Date of Birth/Sex: Female) Treating BURNS III, Primary Care Physician: Fidel Levy Physician/Extender: Zollie Beckers Referring Physician: Jones Broom in Treatment: 19 Edema Assessment Assessed: [Left: No] [Right: No] Edema: [Left: Yes] [Right:  Yes] Calf Left: Right: Point of Measurement: 34 cm From Medial Instep 51.2 cm 45.6 cm Ankle Left: Right: Point of Measurement: 10 cm From Medial Instep 26.4 cm 25.8 cm Vascular Assessment Pulses: Posterior Tibial Dorsalis Pedis Palpable: [Left:Yes] [Right:Yes] Extremity colors, hair growth, and conditions: Extremity Color: [Left:Normal] [Right:Normal] Hair Growth on Extremity: [Left:No] [Right:No] Temperature of Extremity: [Left:Warm] [Right:Warm] Capillary Refill: [Left:< 3 seconds] [Right:< 3 seconds] Toe Nail Assessment Left: Right: Thick: No No Discolored: No No Deformed: No No Improper Length and Hygiene: No No Electronic Signature(s) Signed: 06/20/2015 6:00:56 PM By: Alejandro Mulling Entered By: Alejandro Mulling on 06/14/2015 08:26:06 Milne, Anusha H. ([id number]) Geffre, Abbigail H. ([id number]) -------------------------------------------------------------------------------- Multi Wound Chart Details Patient Name: [name], [name] Date of Service: 06/14/2015 8:00 AM Medical Record Patient Account Number: [id number] Number: Treating RN: Curtis [name] 31, 1957 (59 y.o. 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] [10:Pressure Ulcer] [11:Pressure Ulcer] Comorbid History: [1:Hypertension, Type II Diabetes] [10:N/A] [11:N/A] Date Acquired: [1:01/09/2015] [10:05/31/2015]  [11:05/31/2015] Weeks of Treatment: [1:19] [10:1] [11:1] Wound Status: [1:Open] [10:Open] [11:Open] Measurements L x W x D 18.8x18x0.3 [10:6.5x3.5x0.1] [11:7.5x3.5x0.1] (cm) Area (cm) : [1:265.779] [10:17.868] [11:20.617] Volume (cm) : [1:79.734] [10:1.787] [11:2.062] % Reduction in Area: [1:-565387.20%] [10:-89.60%] [11:-2526.40%] % Reduction in Volume: -1594580.00% [10:-89.70%] [11:-1213.40%] Classification: [1:Full Thickness Without Exposed Support Structures] [10:Category/Stage II] [11:Category/Stage II] HBO Classification: [1:Grade 2] [10:N/A] [11:N/A] Exudate Amount: [1:Medium] [10:N/A] [11:N/A] Exudate Type: [1:Serous] [10:N/A] [11:N/A] Exudate Color: [1:amber] [10:N/A] [11:N/A] Wound Margin: [1:Thickened] [10:N/A] [11:N/A] Granulation Amount: [1:Small (1-33%)] [10:N/A] [11:N/A] Granulation Quality: [1:Pink] [10:N/A] [11:N/A] Necrotic Amount: [1:Large (67-100%)] [10:N/A] [11:N/A] Necrotic Tissue: [1:N/A] [10:N/A] [11:N/A] Exposed Structures: [  10:N/A] [11:N/A] Fascia: No Fat: No Tendon: No Muscle: No Joint: No Bone: No Limited to Skin Breakdown Epithelialization: None N/A N/A Debridement: Debridement (40981- N/A N/A 11047) Time-Out Taken: Yes N/A N/A Pain Control: Other N/A N/A Tissue Debrided: Fibrin/Slough, Exudates, N/A N/A Subcutaneous Level: Skin/Subcutaneous N/A N/A Tissue Debridement Area (sq 338.4 N/A N/A cm): Instrument: Curette N/A N/A Bleeding: Minimum N/A N/A Hemostasis Achieved: Pressure N/A N/A Procedural Pain: 0 N/A N/A Post Procedural Pain: 0 N/A N/A Debridement Treatment Procedure was tolerated N/A N/A Response: well Post Debridement 18.8x18x0.4 N/A N/A Measurements L x W x D (cm) Post Debridement 106.311 N/A N/A Volume: (cm) Periwound Skin Texture: Scarring: Yes No Abnormalities Noted No Abnormalities Noted Edema: No Excoriation: No Induration: No Callus: No Crepitus: No Fluctuance: No Friable: No Rash: No Periwound Skin Moist:  Yes No Abnormalities Noted No Abnormalities Noted Moisture: Maceration: No Dry/Scaly: No Periwound Skin Color: Atrophie Blanche: No No Abnormalities Noted No Abnormalities Noted Cyanosis: No Ecchymosis: No Erythema: No Hemosiderin Staining: No Mottled: No Rothman, Cosima H. ([id number]) Pallor: No Rubor: No Tenderness on Yes No No Palpation: Wound Preparation: Ulcer Cleansing: N/A N/A Rinsed/Irrigated with Saline Topical Anesthetic Applied: Other: lidocaine 4% Procedures Performed: Debridement N/A N/A Wound Number: [id number] 3 [id number]  Photos: No Photos No Photos No Photos Wound Location: Right, Distal Lower Leg Left Lower Leg - Lateral Right Lower Leg - Posterior Wounding Event: Gradually Appeared Gradually Appeared Gradually Appeared Primary Etiology: Calciphylaxis Calciphylaxis Calciphylaxis Comorbid History: N/A Hypertension, Type II Hypertension, Type II Diabetes Diabetes Date Acquired: 01/09/2015 01/09/2015 03/27/2015 Weeks of Treatment: 19 19 10  Wound Status: Converted Open Open Measurements L x W x D N/A 27x17x1.2 22.5x16.8x0.3 (cm) Area (cm) : N/A 360.498 296.881 Volume (cm) : N/A 432.597 89.064 % Reduction in Area: N/A -3178.40% -16701.40% % Reduction in Volume: N/A -39227.00% -50218.60% Classification: Full Thickness Without Full Thickness Without Full Thickness Without Exposed Support Exposed Support Exposed Support Structures Structures Structures HBO Classification: N/A Grade 1 Grade 1 Exudate Amount: N/A Large Large Exudate Type: N/A Serous Serous Exudate Color: N/A amber amber Wound Margin: N/A Flat and Intact Flat and Intact Granulation Amount: N/A Medium (34-66%) Medium (34-66%) Granulation Quality: N/A Red Red Necrotic Amount: N/A Medium (34-66%) Medium (34-66%) Necrotic Tissue: N/A Eschar, Adherent Slough Eschar, Adherent Slough Exposed Structures: N/A Fascia: No Fascia: No Fat: No Fat: No Tendon: No Tendon: No Muscle: No Muscle: No Joint: No Joint:  No Bone: No Bone: No Kraynak, Collette H. ([id number]) Limited to Skin Limited to Skin Breakdown Breakdown Epithelialization: N/A None None Debridement: N/A Debridement ([id number]- Debridement (11042- 11047) 11047) Time-Out Taken: N/A Yes Yes Pain Control: N/A Other Other Tissue Debrided: N/A Fibrin/Slough, Exudates, Fibrin/Slough, Exudates, Subcutaneous Subcutaneous Level: N/A Skin/Subcutaneous Skin/Subcutaneous Tissue Tissue Debridement Area (sq N/A 459 378 cm): Instrument: N/A Curette Curette Bleeding: N/A Minimum Minimum Hemostasis Achieved: N/A Pressure Pressure Procedural Pain: N/A 0 0 Post Procedural Pain: N/A 0 0 Debridement Treatment N/A Procedure was tolerated Procedure was tolerated Response: well well Post Debridement N/A 27x17x1.4 22.5x16.8x0.4 Measurements L x W x D (cm) Post Debridement N/A 504.697 118.752 Volume: (cm) Periwound Skin Texture: No Abnormalities Noted Edema: No Edema: No Excoriation: No Excoriation: No Induration: No Induration: No Callus: No Callus: No Crepitus: No Crepitus: No Fluctuance: No Fluctuance: No Friable: No Friable: No Rash: No Rash: No Scarring: No Scarring: No Periwound Skin No Abnormalities Noted Maceration: No Maceration: No Moisture: Moist: No Moist: No Dry/Scaly: No Dry/Scaly: No Periwound Skin Color: No Abnormalities Noted Atrophie Blanche: No Atrophie  Blanche: No Cyanosis: No Cyanosis: No Ecchymosis: No Ecchymosis: No Erythema: No Erythema: No Hemosiderin Staining: No Hemosiderin Staining: No Mottled: No Mottled: No Pallor: No Pallor: No Rubor: No Rubor: No Tenderness on No No No Palpation: Wound Preparation: N/A Ulcer Cleansing: Other: Ulcer Cleansing: Other: soap and water soap and water Uno, Shirle H. ([id number]) Topical Anesthetic Topical Anesthetic Applied: Other: lidocaine Applied: Other: lidocaine 4% 4% Procedures Performed: N/A Debridement Debridement Wound Number: 9 N/A  N/A Photos: No Photos N/A N/A Wound Location: Left, Posterior Lower Leg N/A N/A Wounding Event: Gradually Appeared N/A N/A Primary Etiology: Calciphylaxis N/A N/A Comorbid History: N/A N/A N/A Date Acquired: 03/13/2015 N/A N/A Weeks of Treatment: 7 N/A N/A Wound Status: Open N/A N/A Measurements L x W x D 3x3.8x1.7 N/A N/A (cm) Area (cm) : 8.954 N/A N/A Volume (cm) : 15.221 N/A N/A % Reduction in Area: 43.00% N/A N/A % Reduction in Volume: -384.40% N/A N/A Classification: Full Thickness Without N/A N/A Exposed Support Structures HBO Classification: N/A N/A N/A Exudate Amount: N/A N/A N/A Exudate Type: N/A N/A N/A Exudate Color: N/A N/A N/A Wound Margin: N/A N/A N/A Granulation Amount: N/A N/A N/A Granulation Quality: N/A N/A N/A Necrotic Amount: N/A N/A N/A Necrotic Tissue: N/A N/A N/A Exposed Structures: N/A N/A N/A Epithelialization: N/A N/A N/A Debridement: Debridement (40981- N/A N/A 11047) Time-Out Taken: Yes N/A N/A Pain Control: Other N/A N/A Tissue Debrided: Fibrin/Slough, Exudates, N/A N/A Subcutaneous Level: Skin/Subcutaneous N/A N/A Tissue Debridement Area (sq 11.4 N/A N/A cm): Instrument: Curette N/A N/A Bleeding: Minimum N/A N/A Hemostasis Achieved: Pressure N/A N/A Procedural Pain: 0 N/A N/A Post Procedural Pain: 0 N/A N/A Persinger, Bindu H. ([id number]) Debridement Treatment Procedure was tolerated N/A N/A Response: well Post Debridement 3x3.8x1.8 N/A N/A Measurements L x W x D (cm) Post Debridement 16.116 N/A N/A Volume: (cm) Periwound Skin Texture: No Abnormalities Noted N/A N/A Periwound Skin No Abnormalities Noted N/A N/A Moisture: Periwound Skin Color: No Abnormalities Noted N/A N/A Tenderness on No N/A N/A Palpation: Wound Preparation: N/A N/A N/A Procedures Performed: Debridement N/A N/A Treatment Notes Electronic Signature(s) Signed: 06/14/2015 5:54:02 PM By: Curtis [name] Entered By: Curtis [name] on 06/14/2015  09:06:06 Seldon, [name] ([id number]) -------------------------------------------------------------------------------- Multi-Disciplinary Care Plan Details Patient Name: Span, Cerissa H. Date of Service: 06/14/2015 8:00 AM Medical Record Patient Account Number: [id number] Number: Treating RN: Curtis [name] Jul 06, 1956 (59 y.o. Other Clinician: Date of Birth/Sex: Female) Treating BURNS III, Primary Care Physician: Fidel Levy Physician/Extender: Zollie Beckers Referring Physician: Jones Broom in Treatment: 43 Active Inactive Orientation to the Wound Care Program Nursing Diagnoses: Knowledge deficit related to the wound healing center program Goals: Patient/caregiver will verbalize understanding of the Wound Healing Center Program Date Initiated: 01/30/2015 Goal Status: Active Interventions: Provide education on orientation to the wound center Notes: Pain, Acute or Chronic Nursing Diagnoses: Pain, acute or chronic: actual or potential Goals: Patient will verbalize adequate pain control and receive pain control interventions during procedures as needed Date Initiated: 01/30/2015 Goal Status: Active Patient/caregiver will verbalize adequate pain control between visits Date Initiated: 01/30/2015 Goal Status: Active Interventions: Assess comfort goal upon admission Encourage patient to take pain medications as prescribed Notes: Venous Leg Ulcer Delashmit, Teren H. ([id number]) Nursing Diagnoses: Potential for venous Insuffiency (use before diagnosis confirmed) Goals: Non-invasive venous studies are completed as ordered Date Initiated: 01/30/2015 Goal Status: Active Interventions: Assess peripheral edema status every visit. Notes: Wound/Skin Impairment Nursing Diagnoses: Impaired tissue integrity Goals: Ulcer/skin breakdown will have a volume reduction of  30% by week 4 Date Initiated: 01/30/2015 Goal Status: Active Ulcer/skin breakdown will  have a volume reduction of 50% by week 8 Date Initiated: 01/30/2015 Goal Status: Active Ulcer/skin breakdown will have a volume reduction of 80% by week 12 Date Initiated: 01/30/2015 Goal Status: Active Ulcer/skin breakdown will heal within 14 weeks Date Initiated: 01/30/2015 Goal Status: Active Interventions: Assess ulceration(s) every visit Notes: Electronic Signature(s) Signed: 06/14/2015 5:54:02 PM By: Curtis Sitesorthy, Joanna Entered By: Curtis Sitesorthy, Joanna on 06/14/2015 09:06:00 Keaney, Destyne H. ([id number]) -------------------------------------------------------------------------------- Pain Assessment Details Patient Name: Nienhuis, Stachia H. Date of Service: 06/14/2015 8:00 AM Medical Record Patient Account Number: [id number] Number: Treating RN: Phillis Haggisinkerton, Debi 09-May-1956 (59 y.o. Other Clinician: Date of Birth/Sex: Female) Treating BURNS III, Primary Care Physician: Fidel LevyHAWKINS JR, JAMES Physician/Extender: Zollie BeckersWALTER Referring Physician: Jones BroomHAWKINS JR, JAMES Weeks in Treatment: 2819 Active Problems Location of Pain Severity and Description of Pain Patient Has Paino No Site Locations Pain Management and Medication Current Pain Management: Electronic Signature(s) Signed: 06/20/2015 6:00:56 PM By: Alejandro MullingPinkerton, Debra Entered By: Alejandro MullingPinkerton, Debra on 06/14/2015 08:14:05 Mccullum, Shay H. ([id number]) -------------------------------------------------------------------------------- Wound Assessment Details Patient Name: Trott, Shaheen H. Date of Service: 06/14/2015 8:00 AM Medical Record Patient Account Number: [id number] Number: Treating RN: Phillis Haggisinkerton, Debi 09-May-1956 (59 y.o. Other Clinician: Date of Birth/Sex: Female) Treating BURNS III, Primary Care Physician: Fidel LevyHAWKINS JR, JAMES Physician/Extender: Zollie BeckersWALTER Referring Physician: Jones BroomHAWKINS JR, JAMES Weeks in Treatment: 19 Wound Status Wound Number: 1 Primary Etiology: Calciphylaxis Wound Location: Right Lower Leg -  Anterior Wound Status: Open Wounding Event: Gradually Appeared Comorbid History: Hypertension, Type II Diabetes Date Acquired: 01/09/2015 Weeks Of Treatment: 19 Clustered Wound: No Wound Measurements Length: (cm) 18.8 Width: (cm) 18 Depth: (cm) 0.3 Area: (cm) 265.779 Volume: (cm) 79.734 % Reduction in Area: -565387.2% % Reduction in Volume: -1594580% Epithelialization: None Tunneling: No Undermining: No Wound Description Full Thickness Without Foul Odor Aft Classification: Exposed Support Structures Diabetic Severity Grade 2 (Wagner): Wound Margin: Thickened Exudate Amount: Medium Exudate Type: Serous Exudate Color: amber er Cleansing: No Wound Bed Granulation Amount: Small (1-33%) Exposed Structure Granulation Quality: Pink Fascia Exposed: No Necrotic Amount: Large (67-100%) Fat Layer Exposed: No Tendon Exposed: No Muscle Exposed: No Joint Exposed: No Bone Exposed: No Limited to Skin Breakdown Periwound Skin Texture Texture Color Dunlow, Chrishauna H. ([id number]) No Abnormalities Noted: No No Abnormalities Noted: No Callus: No Atrophie Blanche: No Crepitus: No Cyanosis: No Excoriation: No Ecchymosis: No Fluctuance: No Erythema: No Friable: No Hemosiderin Staining: No Induration: No Mottled: No Localized Edema: No Pallor: No Rash: No Rubor: No Scarring: Yes Temperature / Pain Moisture Tenderness on Palpation: Yes No Abnormalities Noted: No Dry / Scaly: No Maceration: No Moist: Yes Wound Preparation Ulcer Cleansing: Rinsed/Irrigated with Saline Topical Anesthetic Applied: Other: lidocaine 4%, Electronic Signature(s) Signed: 06/20/2015 6:00:56 PM By: Alejandro MullingPinkerton, Debra Entered By: Alejandro MullingPinkerton, Debra on 06/14/2015 08:40:58 Luckenbach, Imogine H. ([id number]) -------------------------------------------------------------------------------- Wound Assessment Details Patient Name: Stoke, Aira H. Date of Service: 06/14/2015 8:00 AM Medical Record  Patient Account Number: [id number] Number: Treating RN: Phillis Haggisinkerton, Debi 09-May-1956 (59 y.o. Other Clinician: Date of Birth/Sex: Female) Treating BURNS III, Primary Care Physician: Fidel LevyHAWKINS JR, JAMES Physician/Extender: Zollie BeckersWALTER Referring Physician: Jones BroomHAWKINS JR, JAMES Weeks in Treatment: 19 Wound Status Wound Number: 10 Primary Etiology: Pressure Ulcer Wound Location: Left Gluteus Wound Status: Open Wounding Event: Gradually Appeared Comorbid History: Hypertension, Type II Diabetes Date Acquired: 05/31/2015 Weeks Of Treatment: 1 Clustered Wound: No Photos Photo Uploaded By: Alejandro MullingPinkerton, Debra on 06/14/2015 16:53:09 Wound Measurements Length: (cm) 6.5 Width: (cm) 3.5  Depth: (cm) 0.1 Area: (cm) 17.868 Volume: (cm) 1.787 % Reduction in Area: -89.6% % Reduction in Volume: -89.7% Epithelialization: None Tunneling: No Undermining: No Wound Description Classification: Category/Stage II Wound Margin: Flat and Intact Exudate Amount: Small Exudate Type: Serous Exudate Color: amber Wound Bed Granulation Amount: Large (67-100%) Exposed Structure Granulation Quality: Red Fascia Exposed: No Necrotic Amount: None Present (0%) Fat Layer Exposed: No Strahle, Mitali H. ([id number]) Tendon Exposed: No Muscle Exposed: No Joint Exposed: No Bone Exposed: No Limited to Skin Breakdown Periwound Skin Texture Texture Color No Abnormalities Noted: No No Abnormalities Noted: No Callus: No Atrophie Blanche: No Crepitus: No Cyanosis: No Excoriation: No Ecchymosis: No Fluctuance: No Erythema: No Friable: No Hemosiderin Staining: No Induration: No Mottled: No Localized Edema: No Pallor: No Rash: No Rubor: No Scarring: No Moisture No Abnormalities Noted: No Dry / Scaly: No Maceration: No Moist: No Wound Preparation Ulcer Cleansing: Other: soap and water, Topical Anesthetic Applied: None Electronic Signature(s) Signed: 06/14/2015 5:54:02 PM By: Curtis [name] Signed: 06/20/2015 6:00:56 PM By: Alejandro Mulling Entered By: Curtis [name] on 06/14/2015 09:24:25 Vanwingerden, Devetta H. ([id number]) -------------------------------------------------------------------------------- Wound Assessment Details Patient Name: Conover, Audie H. Date of Service: 06/14/2015 8:00 AM Medical Record Patient Account Number: [id number] Number: Treating RN: Phillis Haggis Dec 10, 1955 (59 y.o. Other Clinician: Date of Birth/Sex: Female) Treating BURNS III, Primary Care Physician: Fidel Levy Physician/Extender: Zollie Beckers Referring Physician: Jones Broom in Treatment: 19 Wound Status Wound Number: 11 Primary Etiology: Pressure Ulcer Wound Location: Right Gluteus Wound Status: Open Wounding Event: Gradually Appeared Comorbid History: Hypertension, Type II Diabetes Date Acquired: 05/31/2015 Weeks Of Treatment: 1 Clustered Wound: No Photos Photo Uploaded By: Alejandro Mulling on 06/14/2015 16:53:10 Wound Measurements Length: (cm) 7.5 Width: (cm) 3.5 Depth: (cm) 0.1 Area: (cm) 20.617 Volume: (cm) 2.062 % Reduction in Area: -2526.4% % Reduction in Volume: -1213.4% Epithelialization: None Tunneling: No Undermining: No Wound Description Classification: Category/Stage II Wound Margin: Flat and Intact Exudate Amount: None Present Foul Odor After Cleansing: No Wound Bed Granulation Amount: Large (67-100%) Exposed Structure Granulation Quality: Red, Pink Fascia Exposed: No Necrotic Amount: None Present (0%) Fat Layer Exposed: No Tendon Exposed: No Muscle Exposed: No Sublett, Caraline H. ([id number]) Joint Exposed: No Bone Exposed: No Limited to Skin Breakdown Periwound Skin Texture Texture Color No Abnormalities Noted: No No Abnormalities Noted: No Callus: No Atrophie Blanche: No Crepitus: No Cyanosis: No Excoriation: No Ecchymosis: No Fluctuance: No Erythema: No Friable: No Hemosiderin Staining:  No Induration: No Mottled: No Localized Edema: No Pallor: No Rash: No Rubor: No Scarring: No Moisture No Abnormalities Noted: No Dry / Scaly: No Maceration: No Moist: No Wound Preparation Ulcer Cleansing: Other: soap and water, Topical Anesthetic Applied: None Electronic Signature(s) Signed: 06/14/2015 5:54:02 PM By: Curtis [name] Signed: 06/20/2015 6:00:56 PM By: Alejandro Mulling Entered By: Curtis [name] on 06/14/2015 09:25:32 Husser, Brisa H. ([id number]) -------------------------------------------------------------------------------- Wound Assessment Details Patient Name: Proud, Desta H. Date of Service: 06/14/2015 8:00 AM Medical Record Patient Account Number: [id number] Number: Treating RN: Phillis Haggis [birth year] (59 y.o. Other Clinician: Date of Birth/Sex: Female) Treating BURNS III, Primary Care Physician: Fidel Levy Physician/Extender: Zollie Beckers Referring Physician: Jones Broom in Treatment: 19 Wound Status Wound Number: 2 Primary Etiology: Calciphylaxis Wound Location: Right, Distal Lower Leg Wound Status: Converted Wounding Event: Gradually Appeared Date Acquired: 01/09/2015 Weeks Of Treatment: 19 Clustered Wound: No Photos Photo Uploaded By: Alejandro Mulling on 06/14/2015 16:55:13 Wound Description Full Thickness Without Exposed Classification: Support Structures Periwound Skin Texture Texture Color  No Abnormalities Noted: No No Abnormalities Noted: No Moisture No Abnormalities Noted: No Electronic Signature(s) Signed: 06/20/2015 6:00:56 PM By: Alejandro Mulling Entered By: Alejandro Mulling on 06/14/2015 08:32:10 Waltermire, Bridey H. ([id number]) -------------------------------------------------------------------------------- Wound Assessment Details Patient Name: Laws, Jamya H. Date of Service: 06/14/2015 8:00 AM Medical Record Patient Account Number: [id number] Number: Treating RN:  Phillis Haggis January 22, 1956 (59 y.o. Other Clinician: Date of Birth/Sex: Female) Treating BURNS III, Primary Care Physician: Fidel Levy Physician/Extender: Zollie Beckers Referring Physician: Jones Broom in Treatment: 19 Wound Status Wound Number: 3 Primary Etiology: Calciphylaxis Wound Location: Left Lower Leg - Lateral Wound Status: Open Wounding Event: Gradually Appeared Comorbid History: Hypertension, Type II Diabetes Date Acquired: 01/09/2015 Weeks Of Treatment: 19 Clustered Wound: No Photos Photo Uploaded By: Alejandro Mulling on 06/14/2015 16:55:13 Wound Measurements Length: (cm) 27 Width: (cm) 17 Depth: (cm) 1.2 Area: (cm) 360.498 Volume: (cm) 432.597 % Reduction in Area: -3178.4% % Reduction in Volume: -39227% Epithelialization: None Tunneling: No Undermining: No Wound Description Full Thickness Without Exposed Classification: Support Structures Diabetic Severity Grade 1 (Wagner): Wound Margin: Flat and Intact Exudate Amount: Large Exudate Type: Serous Exudate Color: amber Wound Bed Lye, Ryliee H. ([id number]) Granulation Amount: Medium (34-66%) Exposed Structure Granulation Quality: Red Fascia Exposed: No Necrotic Amount: Medium (34-66%) Fat Layer Exposed: No Necrotic Quality: Eschar, Adherent Slough Tendon Exposed: No Muscle Exposed: No Joint Exposed: No Bone Exposed: No Limited to Skin Breakdown Periwound Skin Texture Texture Color No Abnormalities Noted: No No Abnormalities Noted: No Callus: No Atrophie Blanche: No Crepitus: No Cyanosis: No Excoriation: No Ecchymosis: No Fluctuance: No Erythema: No Friable: No Hemosiderin Staining: No Induration: No Mottled: No Localized Edema: No Pallor: No Rash: No Rubor: No Scarring: No Moisture No Abnormalities Noted: No Dry / Scaly: No Maceration: No Moist: No Wound Preparation Ulcer Cleansing: Other: soap and water, Topical Anesthetic Applied: Other: lidocaine  4%, Electronic Signature(s) Signed: 06/20/2015 6:00:56 PM By: Alejandro Mulling Entered By: Alejandro Mulling on 06/14/2015 08:41:59 Solum, Johnella H. ([id number]) -------------------------------------------------------------------------------- Wound Assessment Details Patient Name: Sprowl, Tayli H. Date of Service: 06/14/2015 8:00 AM Medical Record Patient Account Number: [id number] Number: Treating RN: Phillis Haggis February 25, 1956 (59 y.o. Other Clinician: Date of Birth/Sex: Female) Treating BURNS III, Primary Care Physician: Fidel Levy Physician/Extender: Zollie Beckers Referring Physician: Jones Broom in Treatment: 19 Wound Status Wound Number: 8 Primary Etiology: Calciphylaxis Wound Location: Right Lower Leg - Posterior Wound Status: Open Wounding Event: Gradually Appeared Comorbid History: Hypertension, Type II Diabetes Date Acquired: 03/27/2015 Weeks Of Treatment: 10 Clustered Wound: No Photos Photo Uploaded By: Alejandro Mulling on 06/14/2015 16:54:16 Wound Measurements Length: (cm) 22.5 Width: (cm) 16.8 Depth: (cm) 0.3 Area: (cm) 296.881 Volume: (cm) 89.064 % Reduction in Area: -16701.4% % Reduction in Volume: -50218.6% Epithelialization: None Tunneling: No Undermining: No Wound Description Full Thickness Without Exposed Classification: Support Structures Diabetic Severity Grade 1 (Wagner): Wound Margin: Flat and Intact Exudate Amount: Large Exudate Type: Serous Exudate Color: amber Wound Bed Dorado, Felicite H. ([id number]) Granulation Amount: Medium (34-66%) Exposed Structure Granulation Quality: Red Fascia Exposed: No Necrotic Amount: Medium (34-66%) Fat Layer Exposed: No Necrotic Quality: Eschar, Adherent Slough Tendon Exposed: No Muscle Exposed: No Joint Exposed: No Bone Exposed: No Limited to Skin Breakdown Periwound Skin Texture Texture Color No Abnormalities Noted: No No Abnormalities Noted: No Callus:  No Atrophie Blanche: No Crepitus: No Cyanosis: No Excoriation: No Ecchymosis: No Fluctuance: No Erythema: No Friable: No Hemosiderin Staining: No Induration: No Mottled: No Localized Edema: No Pallor: No Rash:  No Rubor: No Scarring: No Moisture No Abnormalities Noted: No Dry / Scaly: No Maceration: No Moist: No Wound Preparation Ulcer Cleansing: Other: soap and water, Topical Anesthetic Applied: Other: lidocaine 4%, Electronic Signature(s) Signed: 06/20/2015 6:00:56 PM By: Alejandro Mulling Entered By: Alejandro Mulling on 06/14/2015 08:43:24 Rucinski, Geeta Rexene Edison ([id number]) -------------------------------------------------------------------------------- Wound Assessment Details Patient Name: Espinoza, Keilany H. Date of Service: 06/14/2015 8:00 AM Medical Record Patient Account Number: [id number] Number: Treating RN: Phillis Haggis 12-29-1955 (59 y.o. Other Clinician: Date of Birth/Sex: Female) Treating BURNS III, Primary Care Physician: Fidel Levy Physician/Extender: Zollie Beckers Referring Physician: Jones Broom in Treatment: 19 Wound Status Wound Number: 9 Primary Etiology: Calciphylaxis Wound Location: Left Lower Leg - Posterior Wound Status: Open Wounding Event: Gradually Appeared Comorbid History: Hypertension, Type II Diabetes Date Acquired: 03/13/2015 Weeks Of Treatment: 7 Clustered Wound: No Photos Photo Uploaded By: Alejandro Mulling on 06/14/2015 16:54:16 Wound Measurements Length: (cm) 3 Width: (cm) 3.8 Depth: (cm) 1.7 Area: (cm) 8.954 Volume: (cm) 15.221 % Reduction in Area: 43% % Reduction in Volume: -384.4% Epithelialization: None Tunneling: No Undermining: No Wound Description Full Thickness Without Exposed Classification: Support Structures Diabetic Severity Grade 1 (Wagner): Wound Margin: Flat and Intact Exudate Amount: Large Exudate Type: Serous Exudate Color: amber Wound Bed Verga, Kelty H.  ([id number]) Granulation Amount: Medium (34-66%) Exposed Structure Granulation Quality: Red Fascia Exposed: No Necrotic Amount: Medium (34-66%) Fat Layer Exposed: No Necrotic Quality: Eschar, Adherent Slough Tendon Exposed: No Muscle Exposed: No Joint Exposed: No Bone Exposed: No Limited to Skin Breakdown Periwound Skin Texture Texture Color No Abnormalities Noted: No No Abnormalities Noted: No Callus: No Atrophie Blanche: No Crepitus: No Cyanosis: No Excoriation: No Ecchymosis: No Fluctuance: No Erythema: No Friable: No Hemosiderin Staining: No Induration: No Mottled: No Localized Edema: No Pallor: No Rash: No Rubor: No Scarring: No Moisture No Abnormalities Noted: No Dry / Scaly: No Maceration: No Moist: No Wound Preparation Ulcer Cleansing: Other: soap and water, Topical Anesthetic Applied: Other: lidocaine 4%, Electronic Signature(s) Signed: 06/14/2015 5:54:02 PM By: Curtis [name] Signed: 06/20/2015 6:00:56 PM By: Alejandro Mulling Entered By: Curtis [name] on 06/14/2015 09:22:57 Height, Joci Rexene Edison ([id number]) -------------------------------------------------------------------------------- Vitals Details Patient Name: Friley, Syleena H. Date of Service: 06/14/2015 8:00 AM Medical Record Patient Account Number: [id number] Number: Treating RN: Phillis Haggis 11-25-1955 (59 y.o. Other Clinician: Date of Birth/Sex: Female) Treating BURNS III, Primary Care Physician: Fidel Levy Physician/Extender: Zollie Beckers Referring Physician: Jones Broom in Treatment: 19 Vital Signs Time Taken: 08:14 Temperature (F): 98.0 Height (in): 68 Pulse (bpm): 79 Weight (lbs): 294 Respiratory Rate (breaths/min): 18 Body Mass Index (BMI): 44.7 Blood Pressure (mmHg): 130/61 Reference Range: 80 - 120 mg / dl Electronic Signature(s) Signed: 06/20/2015 6:00:56 PM By: Alejandro Mulling Entered By: Alejandro Mulling on 06/14/2015 08:17:13

## 2015-06-22 DIAGNOSIS — N186 End stage renal disease: Secondary | ICD-10-CM | POA: Diagnosis not present

## 2015-06-22 DIAGNOSIS — Y841 Kidney dialysis as the cause of abnormal reaction of the patient, or of later complication, without mention of misadventure at the time of the procedure: Secondary | ICD-10-CM | POA: Diagnosis not present

## 2015-06-22 DIAGNOSIS — L97912 Non-pressure chronic ulcer of unspecified part of right lower leg with fat layer exposed: Secondary | ICD-10-CM | POA: Diagnosis not present

## 2015-06-22 DIAGNOSIS — E119 Type 2 diabetes mellitus without complications: Secondary | ICD-10-CM | POA: Diagnosis not present

## 2015-06-22 DIAGNOSIS — T82318A Breakdown (mechanical) of other vascular grafts, initial encounter: Secondary | ICD-10-CM | POA: Diagnosis not present

## 2015-06-22 DIAGNOSIS — L97922 Non-pressure chronic ulcer of unspecified part of left lower leg with fat layer exposed: Secondary | ICD-10-CM | POA: Diagnosis not present

## 2015-06-24 NOTE — Progress Notes (Signed)
[name], [name] ([id number]) Visit Report for 06/21/2015 Chief Complaint Document Details Patient Name: [name], [name]. Date of Service: 06/21/2015 8:15 AM Medical Record Patient Account Number: [id number] Number: Treating RN: Huel Coventry 26-Mar-1956 (59 y.o. Other Clinician: Date of Birth/Sex: Female) Treating BURNS III, Primary Care Physician/Extender: Karn Cassis Physician: Referring Physician: Jones Broom in Treatment: 20 Information Obtained from: Patient Chief Complaint Bilateral calf ulcers. Electronic Signature(s) Signed: 06/21/2015 2:46:32 PM By: Madelaine Bhat MD Entered By: Madelaine Bhat on 06/21/2015 13:55:21 Totten, [name] ([id number]) -------------------------------------------------------------------------------- Debridement Details Patient Name: [name], Ora H. Date of Service: 06/21/2015 8:15 AM Medical Record Patient Account Number: [id number] Number: Treating RN: Huel Coventry 1956/03/29 (59 y.o. Other Clinician: Date of Birth/Sex: Female) Treating BURNS III, Primary Care Physician/Extender: Karn Cassis Physician: Referring Physician: Jones Broom in Treatment: 20 Debridement Performed for Wound #3 Left,Lateral Lower Leg Assessment: Performed By: Physician BURNS III, Melanie Crazier., MD Debridement: Debridement Pre-procedure Yes Verification/Time Out Taken: Start Time: 09:00 Pain Control: Other : lidocaine 4% Level: Skin/Subcutaneous Tissue Total Area Debrided (L x 10 (cm) x 10 (cm) = 100 (cm) W): Tissue and other Viable, Non-Viable, Eschar, Exudate, Fat, Fibrin/Slough, Skin, Subcutaneous material debrided: Instrument: Curette Bleeding: Minimum Hemostasis Achieved: Pressure End Time: 09:10 Procedural Pain: 2 Post Procedural Pain: 0 Response to Treatment: Procedure was tolerated well Post Debridement Measurements of Total Wound Length: (cm) 20 Width: (cm)  10 Depth: (cm) 1 Volume: (cm) 157.08 Post Procedure Diagnosis Same as Pre-procedure Electronic Signature(s) Signed: 06/21/2015 2:46:32 PM By: Madelaine Bhat MD Signed: 06/23/2015 3:49:12 PM By: Elliot Gurney RN, BSN, Kim RN, BSN Entered By: Madelaine Bhat on 06/21/2015 13:54:52 Cortright, [name] ([id number]) Snell, Kadasia H. ([id number]) -------------------------------------------------------------------------------- Debridement Details Patient Name: [name], Maralyn H. Date of Service: 06/21/2015 8:15 AM Medical Record Patient Account Number: [id number] Number: Treating RN: Huel Coventry March 28, 1956 (59 y.o. Other Clinician: Date of Birth/Sex: Female) Treating BURNS III, Primary Care Physician/Extender: Karn Cassis Physician: Referring Physician: Jones Broom in Treatment: 20 Debridement Performed for Wound #8 Right,Posterior Lower Leg Assessment: Performed By: Physician BURNS III, Melanie Crazier., MD Debridement: Debridement Pre-procedure Yes Verification/Time Out Taken: Start Time: 09:00 Pain Control: Other : lidocaine 4% Level: Skin/Subcutaneous Tissue Total Area Debrided (L x 5 (cm) x 6 (cm) = 30 (cm) W): Tissue and other Viable, Non-Viable, Eschar, Exudate, Fat, Fibrin/Slough, Subcutaneous material debrided: Instrument: Curette Bleeding: Minimum Hemostasis Achieved: Pressure End Time: 09:10 Procedural Pain: 2 Post Procedural Pain: 0 Response to Treatment: Procedure was tolerated well Post Debridement Measurements of Total Wound Length: (cm) 10 Width: (cm) 11 Depth: (cm) 0.4 Volume: (cm) 34.558 Post Procedure Diagnosis Same as Pre-procedure Electronic Signature(s) Signed: 06/21/2015 2:46:32 PM By: Madelaine Bhat MD Signed: 06/23/2015 3:49:12 PM By: Elliot Gurney, RN, BSN, Kim RN, BSN Entered By: Madelaine Bhat on 06/21/2015 13:55:12 Kalmbach, [name] ([id number]) Herrle, Madalaine H.  ([id number]) -------------------------------------------------------------------------------- HPI Details Patient Name: [name], [name] Date of Service: 06/21/2015 8:15 AM Medical Record Patient Account Number: [id number] Number: Treating RN: Huel Coventry Sep 29, 1955 (59 y.o. Other Clinician: Date of Birth/Sex: Female) Treating BURNS III, Primary Care Physician/Extender: Karn Cassis Physician: Referring Physician: Jones Broom in Treatment: 20 History of Present Illness HPI Description: 59 year old with h/o DM (Hgb A1c 7.4 in Aug 2016), ESRD (on hemodialysis since July 2016). Presented to her PCP Dr. Venora Maples for BLE ulcers since June 2016. She was taken to the  OR on 03/13/2015 for a angioplasty of her left arm AV fistula. No lower extremity debridement or biopsies. 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 No record of venous US to assess for venous insufficiency. Patient has declined [place]. Culture 04/19/2015 grew methicillin sensitive staph aureus (sensitive to tetracycline), Stenotrophomonas maltophilia, and Enterococcus faecalis. Completed doxycycline. Recently hospitalized for worsening cellulitis. Treated with IV antibiotics. No operative debridement or biopsy. Performing dressing changes with silver alginate. Tolerating Profore light bilaterally. Declined punch biopsy. She returns to clinic and reports that her legs feel better for still having moderate pain on the right. No fever or chills. Less drainage. Electronic Signature(s) Signed: 06/21/2015 2:46:32 PM By: Madelaine Bhat MD Entered By: Madelaine Bhat on 06/21/2015 13:55:54 Blubaugh, [name] ([id number]) -------------------------------------------------------------------------------- Physical Exam Details Patient Name: [name], [name] Date of Service:  06/21/2015 8:15 AM Medical Record Patient Account Number: [id number] Number: Treating RN: Huel Coventry 02-Apr-1956 (59 y.o. Other Clinician: Date of Birth/Sex: Female) Treating BURNS III, Primary Care Physician/Extender: Karn Cassis Physician: Referring Physician: Fidel Levy Weeks in Treatment: 20 Constitutional . Pulse regular. Respirations normal and unlabored. Afebrile. . Notes Bilateral calf ulcerations improved since hospitalization. Mild cellulitis on the right. Less necrotic tissue. Persistent 2+ pitting edema. Faintly palpable DP bilaterally. Triphasic on the right. Biphasic on the left. Noncompressible. Electronic Signature(s) Signed: 06/21/2015 2:46:32 PM By: Madelaine Bhat MD Entered By: Madelaine Bhat on 06/21/2015 13:56:31 Szczygiel, [name] ([id number]) -------------------------------------------------------------------------------- Physician Orders Details Patient Name: Perrone, Derhonda H. Date of Service: 06/21/2015 8:15 AM Medical Record Patient Account Number: [id number] Number: Treating RN: Huel Coventry 1956/04/17 (59 y.o. Other Clinician: Date of Birth/Sex: Female) Treating BURNS III, Primary Care Physician/Extender: Karn Cassis Physician: Referring Physician: Jones Broom in Treatment: 20 Verbal / Phone Orders: Yes Clinician: Huel Coventry Read Back and Verified: Yes Diagnosis Coding Wound Cleansing Wound #1 Right,Anterior Lower Leg o Clean wound with Normal Saline. o Cleanse wound with mild soap and water o May Shower, gently pat wound dry prior to applying new dressing. Wound #10 Left Gluteus o Clean wound with Normal Saline. o Cleanse wound with mild soap and water o May Shower, gently pat wound dry prior to applying new dressing. Wound #11 Right Gluteus o Clean wound with Normal Saline. o Cleanse wound with mild soap and water o May Shower, gently pat wound dry  prior to applying new dressing. Wound #3 Left,Lateral Lower Leg o Clean wound with Normal Saline. o Cleanse wound with mild soap and water o May Shower, gently pat wound dry prior to applying new dressing. Wound #8 Right,Posterior Lower Leg o Clean wound with Normal Saline. o Cleanse wound with mild soap and water o May Shower, gently pat wound dry prior to applying new dressing. Wound #9 Left,Posterior Lower Leg o Clean wound with Normal Saline. o Cleanse wound with mild soap and water o May Shower, gently pat wound dry prior to applying new dressing. Anesthetic Wound #1 Right,Anterior Lower Leg o Topical Lidocaine 4% cream applied to wound bed prior to debridement Salls, Chesnee H. ([id number]) Wound #3 Left,Lateral Lower Leg o Topical Lidocaine 4% cream applied to wound bed prior to debridement Wound #8 Right,Posterior Lower Leg o Topical Lidocaine 4% cream applied to wound bed prior to debridement Wound #9 Left,Posterior Lower Leg o Topical Lidocaine 4% cream applied to wound bed prior to debridement Skin  Barriers/Peri-Wound Care Wound #10 Left Gluteus o Barrier cream Wound #11 Right Gluteus o Barrier cream Primary Wound Dressing Wound #1 Right,Anterior Lower Leg o Aquacel Ag - Or Equivalent Wound #3 Left,Lateral Lower Leg o Aquacel Ag - Or Equivalent Wound #8 Right,Posterior Lower Leg o Aquacel Ag - Or Equivalent Wound #9 Left,Posterior Lower Leg o Aquacel Ag - Or Equivalent Secondary Dressing Wound #1 Right,Anterior Lower Leg o ABD pad Wound #3 Left,Lateral Lower Leg o ABD pad Wound #8 Right,Posterior Lower Leg o ABD pad Wound #9 Left,Posterior Lower Leg o ABD pad Dressing Change Frequency Wound #1 Right,Anterior Lower Leg o Change Dressing Monday, Wednesday, Friday - Wednesday in wound care center Wound #3 Left,Lateral Lower Leg Mennenga, Kuulei H. ([id number]) o Change Dressing Monday, Wednesday, Friday -  Wednesday in wound care center Wound #8 Right,Posterior Lower Leg o Change Dressing Monday, Wednesday, Friday - Wednesday in wound care center Wound #9 Left,Posterior Lower Leg o Change Dressing Monday, Wednesday, Friday - Wednesday in wound care center Follow-up Appointments Wound #1 Right,Anterior Lower Leg o Return Appointment in 1 week. Wound #10 Left Gluteus o Return Appointment in 1 week. Wound #11 Right Gluteus o Return Appointment in 1 week. Wound #3 Left,Lateral Lower Leg o Return Appointment in 1 week. Wound #8 Right,Posterior Lower Leg o Return Appointment in 1 week. Wound #9 Left,Posterior Lower Leg o Return Appointment in 1 week. Edema Control Wound #1 Right,Anterior Lower Leg o 3 Layer Compression System - Bilateral - Profore lite o Elevate legs to the level of the heart and pump ankles as often as possible - Elevate legs at Dialysis Wound #3 Left,Lateral Lower Leg o 3 Layer Compression System - Bilateral - Profore lite o Elevate legs to the level of the heart and pump ankles as often as possible - Elevate legs at Dialysis Wound #8 Right,Posterior Lower Leg o 3 Layer Compression System - Bilateral - Profore lite o Elevate legs to the level of the heart and pump ankles as often as possible - Elevate legs at Dialysis Wound #9 Left,Posterior Lower Leg o 3 Layer Compression System - Bilateral - Profore lite o Elevate legs to the level of the heart and pump ankles as often as possible - Elevate legs at Dialysis Recchia, Devri H. ([id number]) Off-Loading Wound #10 Left Gluteus o Turn and reposition every 2 hours Wound #11 Right Gluteus o Turn and reposition every 2 hours Home Health Wound #1 Right,Anterior Lower Leg o Continue Home Health Visits - Advanced: Monday, Wednesday and Friday **WRAP GOES FROM BASE OF TOES TO THREE FINGER WIDTHS BENEATH BEND IN KNEE. o Home Health Nurse may visit PRN to address patientos wound care  needs. o FACE TO FACE ENCOUNTER: MEDICARE and MEDICAID PATIENTS: I certify that this patient is under my care and that I had a face-to-face encounter that meets the physician face-to-face encounter requirements with this patient on this date. The encounter with the patient was in whole or in part for the following MEDICAL CONDITION: (primary reason for Home Healthcare) MEDICAL NECESSITY: I certify, that based on my findings, NURSING services are a medically necessary home health service. HOME BOUND STATUS: I certify that my clinical findings support that this patient is homebound (i.e., Due to illness or injury, pt requires aid of supportive devices such as crutches, cane, wheelchairs, walkers, the use of special transportation or the assistance of another person to leave their place of residence. There is a normal inability to leave the home and doing so requires considerable and  taxing effort. Other absences are for medical reasons / religious services and are infrequent or of short duration when for other reasons). o If current dressing causes regression in wound condition, may D/C ordered dressing product/s and apply Normal Saline Moist Dressing daily until next Wound Healing Center / Other MD appointment. Notify Wound Healing Center of regression in wound condition at [phone]. o Please direct any NON-WOUND related issues/requests for orders to patient's Primary Care Physician Wound #3 Left,Lateral Lower Leg o Continue Home Health Visits - Advanced: Monday, Wednesday and Friday **WRAP GOES FROM BASE OF TOES TO THREE FINGER WIDTHS BENEATH BEND IN KNEE. o Home Health Nurse may visit PRN to address patientos wound care needs. o FACE TO FACE ENCOUNTER: MEDICARE and MEDICAID PATIENTS: I certify that this patient is under my care and that I had a face-to-face encounter that meets the physician face-to-face encounter requirements with this patient on this date. The encounter  with the patient was in whole or in part for the following MEDICAL CONDITION: (primary reason for Home Healthcare) MEDICAL NECESSITY: I certify, that based on my findings, NURSING services are a medically necessary home health service. HOME BOUND STATUS: I certify that my clinical findings support that this patient is homebound (i.e., Due to illness or injury, pt requires aid of supportive devices such as crutches, cane, wheelchairs, walkers, the use of special transportation or the assistance of another person to leave their place of residence. There is a normal inability to leave the home and doing so requires considerable and taxing effort. Other absences are for medical reasons / religious services and are infrequent or of short duration when for other reasons). o If current dressing causes regression in wound condition, may D/C ordered dressing product/s and apply Normal Saline Moist Dressing daily until next Wound Healing Center / Other MD appointment. Notify Wound Healing Center of regression in wound condition at [phone]. VICKYE, ASTORINO ([id number]) o Please direct any NON-WOUND related issues/requests for orders to patient's Primary Care Physician Wound #8 Right,Posterior Lower Leg o Continue Home Health Visits - Advanced: Monday, Wednesday and Friday **WRAP GOES FROM BASE OF TOES TO THREE FINGER WIDTHS BENEATH BEND IN KNEE. o Home Health Nurse may visit PRN to address patientos wound care needs. o FACE TO FACE ENCOUNTER: MEDICARE and MEDICAID PATIENTS: I certify that this patient is under my care and that I had a face-to-face encounter that meets the physician face-to-face encounter requirements with this patient on this date. The encounter with the patient was in whole or in part for the following MEDICAL CONDITION: (primary reason for Home Healthcare) MEDICAL NECESSITY: I certify, that based on my findings, NURSING services are a medically necessary home health  service. HOME BOUND STATUS: I certify that my clinical findings support that this patient is homebound (i.e., Due to illness or injury, pt requires aid of supportive devices such as crutches, cane, wheelchairs, walkers, the use of special transportation or the assistance of another person to leave their place of residence. There is a normal inability to leave the home and doing so requires considerable and taxing effort. Other absences are for medical reasons / religious services and are infrequent or of short duration when for other reasons). o If current dressing causes regression in wound condition, may D/C ordered dressing product/s and apply Normal Saline Moist Dressing daily until next Wound Healing Center / Other MD appointment. Notify Wound Healing Center of regression in wound condition at [phone]. o Please direct any NON-WOUND related issues/requests  for orders to patient's Primary Care Physician Wound #9 Left,Posterior Lower Leg o Continue Home Health Visits - Advanced: Monday, Wednesday and Friday **WRAP GOES FROM BASE OF TOES TO THREE FINGER WIDTHS BENEATH BEND IN KNEE. o Home Health Nurse may visit PRN to address patientos wound care needs. o FACE TO FACE ENCOUNTER: MEDICARE and MEDICAID PATIENTS: I certify that this patient is under my care and that I had a face-to-face encounter that meets the physician face-to-face encounter requirements with this patient on this date. The encounter with the patient was in whole or in part for the following MEDICAL CONDITION: (primary reason for Home Healthcare) MEDICAL NECESSITY: I certify, that based on my findings, NURSING services are a medically necessary home health service. HOME BOUND STATUS: I certify that my clinical findings support that this patient is homebound (i.e., Due to illness or injury, pt requires aid of supportive devices such as crutches, cane, wheelchairs, walkers, the use of special transportation or the  assistance of another person to leave their place of residence. There is a normal inability to leave the home and doing so requires considerable and taxing effort. Other absences are for medical reasons / religious services and are infrequent or of short duration when for other reasons). o If current dressing causes regression in wound condition, may D/C ordered dressing product/s and apply Normal Saline Moist Dressing daily until next Wound Healing Center / Other MD appointment. Notify Wound Healing Center of regression in wound condition at [phone]. o Please direct any NON-WOUND related issues/requests for orders to patient's Primary Care Physician Patient Medications Allergies: Vicodin Pianka, Elizet H. ([id number]) Notifications Medication Indication Start End doxycycline hyclate DOSE oral 100 mg tablet - tablet oral Electronic Signature(s) Signed: 06/21/2015 2:46:32 PM By: Madelaine Bhat MD Signed: 06/23/2015 3:49:12 PM By: Elliot Gurney RN, BSN, Kim RN, BSN Entered By: Elliot Gurney, RN, BSN, Kim on 06/21/2015 09:20:32 Reznik, [name] ([id number]) -------------------------------------------------------------------------------- Prescription 06/21/2015 Patient Name: Turpin, Yliana H. Physician: Madelaine Bhat MD Date of Birth: [birth year] NPI#: [id number] Sex: F DEA#: Phone #: [phone] License #: Patient Address: [facility] [address] [place], [place] [address] [facility] [address], [address] [place], [place] [address] [phone] Allergies Vicodin Medication Medication: Route: Strength: Form: doxycycline hyclate oral 100 mg tablet Class: PERIODONTAL COLLAGENASE INHIBITORS Dose: Frequency / Time: Indication: tablet oral Number of Refills: Number of Units: 0 Generic Substitution: Start Date: End Date: Administered at Substitution Permitted Facility: No Note to  Pharmacy: [facility]): Date(s): Electronic Signature(s) TISHEENA, MAGUIRE ([id number]) Signed: 06/21/2015 2:46:32 PM By: Madelaine Bhat MD Signed: 06/23/2015 3:49:12 PM By: Elliot Gurney RN, BSN, Kim RN, BSN Entered By: Elliot Gurney, RN, BSN, Kim on 06/21/2015 09:20:34 Hawe, [name] ([id number]) --------------------------------------------------------------------------------  Problem List Details Patient Name: Weldy, Destyn H. Date of Service: 06/21/2015 8:15 AM Medical Record Patient Account Number: [id number] Number: Treating RN: Huel Coventry October 09, 1955 (59 y.o. 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 L97.212 Non-pressure chronic ulcer of right calf with fat layer 01/30/2015 Yes exposed R60.0 Localized edema 04/19/2015 Yes L89.152 Pressure ulcer of sacral region, stage 2 06/07/2015 Yes Inactive Problems Resolved Problems  ICD-10 Code Description Active Date Resolved Date L03.115 Cellulitis of right lower limb 05/31/2015 05/31/2015 L03.116 Cellulitis of left lower limb 05/31/2015 05/31/2015 Schamberger, TERRIANNE CAVNESS ([id number]) Electronic Signature(s) Signed: 06/21/2015 2:46:32 PM By: Madelaine Bhat MD Entered By: Madelaine Bhat on 06/21/2015 13:54:31 Burkman, Neliah Rexene Edison ([id number]) -------------------------------------------------------------------------------- Progress Note Details Patient Name: Aten, Tyese H. Date of Service: 06/21/2015 8:15 AM Medical Record Patient Account Number: [id number] Number: Treating RN: Huel Coventry 08-Jan-1956 (59 y.o. Other Clinician: Date of Birth/Sex: Female) Treating BURNS III, Primary Care Physician/Extender: Karn Cassis Physician: Referring Physician: Jones Broom in Treatment: 20 Subjective Chief Complaint Information obtained from Patient Bilateral calf ulcers. History of Present Illness (HPI) 59 year old with h/o DM (Hgb A1c 7.4 in Aug 2016), ESRD (on hemodialysis since July 2016). Presented to her PCP Dr. Venora Maples for BLE ulcers since June 2016. She was taken to the OR on 03/13/2015 for a angioplasty of her left arm AV fistula. No lower extremity debridement or biopsies. 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 No record of venous US to assess for venous insufficiency. Patient has declined [place]. Culture 04/19/2015 grew methicillin sensitive staph aureus (sensitive to tetracycline), Stenotrophomonas maltophilia, and Enterococcus faecalis. Completed doxycycline. Recently hospitalized for worsening cellulitis. Treated with IV antibiotics. No operative debridement or biopsy. Performing dressing changes with silver alginate. Tolerating Profore light bilaterally. Declined punch biopsy. She returns to clinic and reports that her legs feel better for still having moderate pain on the right. No fever or chills. Less drainage. Objective Constitutional Pulse regular. Respirations normal and unlabored. Afebrile. Vitals Time Taken: 8:18 AM, Height: 68 in, Weight: 294 lbs, BMI: 44.7, Temperature: 97.5 F, Pulse: 95 bpm, Respiratory Rate: 18 breaths/min, Blood Pressure: 142/116 mmHg. Wax, Aliscia HMarland Kitchen ([id number]) General Notes: Bilateral calf ulcerations improved since hospitalization. Mild cellulitis on the right. Less necrotic tissue. Persistent 2+ pitting edema. Faintly palpable DP bilaterally. Triphasic on the right. Biphasic on  the left. Noncompressible. Integumentary (Hair, Skin) Wound #1 status is Open. Original cause of wound was Gradually Appeared. The wound is located on the Right,Anterior Lower Leg. The wound measures 18.5cm length x 14cm width x 0.3cm depth; 203.418cm^2 area and 61.025cm^3 volume. The wound is limited to skin breakdown. There is a medium amount of serous drainage noted. The wound margin is thickened. There is small (1-33%) pink granulation within the wound bed. There is a large (67-100%) amount of necrotic tissue within the wound bed. The periwound skin appearance exhibited: Scarring, Moist. The periwound skin appearance did not exhibit: Callus, Crepitus, Excoriation, Fluctuance, Friable, Induration, Localized Edema, Rash, Dry/Scaly, Maceration, Atrophie Blanche, Cyanosis, Ecchymosis, Hemosiderin Staining, Mottled, Pallor, Rubor, Erythema. The periwound has tenderness on palpation. Wound #10 status is Open. Original cause of wound was Gradually Appeared. The wound is located on the Left Gluteus. The wound measures 0.4cm length x 0.5cm width x 0.1cm depth; 0.157cm^2 area and 0.016cm^3 volume. The wound is limited to skin breakdown. There is a small amount of serous drainage noted. The wound margin is flat and intact. There is large (67-100%) red granulation within the wound bed. There is no necrotic tissue within the wound bed. The periwound skin appearance did not exhibit: Callus, Crepitus, Excoriation, Fluctuance, Friable, Induration, Localized Edema, Rash, Scarring, Dry/Scaly, Maceration, Moist, Atrophie Blanche, Cyanosis, Ecchymosis, Hemosiderin Staining, Mottled, Pallor, Rubor, Erythema. Wound #11 status is Open. Original cause of wound was Gradually Appeared. The wound  is located on the Right Gluteus. The wound measures 0.2cm length x 0.3cm width x 0.1cm depth; 0.047cm^2 area and 0.005cm^3 volume. Wound #3 status is Open. Original cause of wound was Gradually Appeared. The wound is located  on the Left,Lateral Lower Leg. The wound measures 20cm length x 10cm width x 0.9cm depth; 157.08cm^2 area and 141.372cm^3 volume. The wound is limited to skin breakdown. There is a large amount of serous drainage noted. The wound margin is flat and intact. There is medium (34-66%) red granulation within the wound bed. There is a medium (34-66%) amount of necrotic tissue within the wound bed including Adherent Slough. The periwound skin appearance did not exhibit: Callus, Crepitus, Excoriation, Fluctuance, Friable, Induration, Localized Edema, Rash, Scarring, Dry/Scaly, Maceration, Moist, Atrophie Blanche, Cyanosis, Ecchymosis, Hemosiderin Staining, Mottled, Pallor, Rubor, Erythema. Wound #8 status is Open. Original cause of wound was Gradually Appeared. The wound is located on the Right,Posterior Lower Leg. The wound measures 10cm length x 11cm width x 0.3cm depth; 86.394cm^2 area and 25.918cm^3 volume. The wound is limited to skin breakdown. There is a large amount of serous drainage noted. The wound margin is flat and intact. There is medium (34-66%) red granulation within the wound bed. There is a medium (34-66%) amount of necrotic tissue within the wound bed including Eschar and Adherent Slough. The periwound skin appearance did not exhibit: Callus, Crepitus, Excoriation, Fluctuance, Friable, Induration, Localized Edema, Rash, Scarring, Dry/Scaly, Maceration, Moist, Atrophie Blanche, Cyanosis, Ecchymosis, Hemosiderin Staining, Mottled, Pallor, Rubor, Erythema. Wound #9 status is Open. Original cause of wound was Gradually Appeared. The wound is located on the Left,Posterior Lower Leg. The wound measures 2cm length x 2.5cm width x 0.6cm depth; 3.927cm^2 area and 2.356cm^3 volume. LISBETH, PULLER ([id number]) Assessment Active Problems ICD-10 E11.622 - Type 2 diabetes mellitus with other skin ulcer N18.6 - End stage renal disease L97.222 - Non-pressure chronic ulcer of left calf with  fat layer exposed L97.212 - Non-pressure chronic ulcer of right calf with fat layer exposed R60.0 - Localized edema L89.152 - Pressure ulcer of sacral region, stage 2 Extensive bilateral calf ulcerations. 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 There was a Skin/Subcutaneous Tissue Debridement (40981-19147) debridement with total area of 100 sq cm performed by BURNS III, Melanie Crazier., MD. with the following instrument(s): Curette to remove Viable and Non-Viable tissue/material including Exudate, Fat, Fibrin/Slough, Eschar, Skin, and Subcutaneous after achieving pain control using Other (lidocaine 4%). A time out was conducted prior to the start of the procedure. A Minimum amount of bleeding was controlled with Pressure. The procedure was tolerated well with a pain level of 2 throughout and a pain level of 0 following the procedure. Post Debridement Measurements: 20cm length x 10cm width x 1cm depth; 157.08cm^3 volume. 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 There was a Skin/Subcutaneous Tissue Debridement ([id number]) debridement with total area of 30 sq cm performed by BURNS III, Melanie Crazier., MD. with the following instrument(s): Curette to remove Viable and Non-Viable tissue/material including Exudate, Fat, Fibrin/Slough, Eschar, and Subcutaneous after achieving pain control using Other (lidocaine 4%). A time out was conducted prior to the start of the procedure. A Minimum amount of bleeding was controlled with Pressure. The procedure was tolerated well with a pain level of 2 throughout and a pain level of 0 following the procedure. Post Debridement Measurements: 10cm length x 11cm width x  0.4cm depth; 34.558cm^3 volume. Post procedure Diagnosis Wound #8: Same as Pre-Procedure Hallas, Alantra H. ([id number]) Plan Wound Cleansing: Wound #1  Right,Anterior Lower Leg: Clean wound with Normal Saline. Cleanse wound with mild soap and water May Shower, gently pat wound dry prior to applying new dressing. Wound #10 Left Gluteus: Clean wound with Normal Saline. Cleanse wound with mild soap and water May Shower, gently pat wound dry prior to applying new dressing. Wound #11 Right Gluteus: Clean wound with Normal Saline. Cleanse wound with mild soap and water May Shower, gently pat wound dry prior to applying new dressing. Wound #3 Left,Lateral Lower Leg: Clean wound with Normal Saline. Cleanse wound with mild soap and water May Shower, gently pat wound dry prior to applying new dressing. Wound #8 Right,Posterior Lower Leg: Clean wound with Normal Saline. Cleanse wound with mild soap and water May Shower, gently pat wound dry prior to applying new dressing. Wound #9 Left,Posterior Lower Leg: Clean wound with Normal Saline. Cleanse wound with mild soap and water May Shower, gently pat wound dry prior to applying new dressing. Anesthetic: Wound #1 Right,Anterior Lower Leg: Topical Lidocaine 4% cream applied to wound bed prior to debridement Wound #3 Left,Lateral Lower Leg: Topical Lidocaine 4% cream applied to wound bed prior to debridement Wound #8 Right,Posterior Lower Leg: Topical Lidocaine 4% cream applied to wound bed prior to debridement Wound #9 Left,Posterior Lower Leg: Topical Lidocaine 4% cream applied to wound bed prior to debridement Skin Barriers/Peri-Wound Care: Wound #10 Left Gluteus: Barrier cream Wound #11 Right Gluteus: Barrier cream Primary Wound Dressing: Wound #1 Right,Anterior Lower Leg: Aquacel Ag - Or Equivalent Wound #3 Left,Lateral Lower Leg: Aquacel Ag - Or Equivalent Wound #8 Right,Posterior Lower Leg: Oshel, Kamdyn H. ([id number]) Aquacel Ag - Or Equivalent Wound #9 Left,Posterior Lower Leg: Aquacel Ag - Or Equivalent Secondary Dressing: Wound #1 Right,Anterior Lower Leg: ABD  pad Wound #3 Left,Lateral Lower Leg: ABD pad Wound #8 Right,Posterior Lower Leg: ABD pad Wound #9 Left,Posterior Lower Leg: ABD pad Dressing Change Frequency: Wound #1 Right,Anterior Lower Leg: Change Dressing Monday, Wednesday, Friday - Wednesday in wound care center Wound #3 Left,Lateral Lower Leg: Change Dressing Monday, Wednesday, Friday - Wednesday in wound care center Wound #8 Right,Posterior Lower Leg: Change Dressing Monday, Wednesday, Friday - Wednesday in wound care center Wound #9 Left,Posterior Lower Leg: Change Dressing Monday, Wednesday, Friday - Wednesday in wound care center Follow-up Appointments: Wound #1 Right,Anterior Lower Leg: Return Appointment in 1 week. Wound #10 Left Gluteus: Return Appointment in 1 week. Wound #11 Right Gluteus: Return Appointment in 1 week. Wound #3 Left,Lateral Lower Leg: Return Appointment in 1 week. Wound #8 Right,Posterior Lower Leg: Return Appointment in 1 week. Wound #9 Left,Posterior Lower Leg: Return Appointment in 1 week. Edema Control: Wound #1 Right,Anterior Lower Leg: 3 Layer Compression System - Bilateral - Profore lite Elevate legs to the level of the heart and pump ankles as often as possible - Elevate legs at Dialysis Wound #3 Left,Lateral Lower Leg: 3 Layer Compression System - Bilateral - Profore lite Elevate legs to the level of the heart and pump ankles as often as possible - Elevate legs at Dialysis Wound #8 Right,Posterior Lower Leg: 3 Layer Compression System - Bilateral - Profore lite Elevate legs to the level of the heart and pump ankles as often as possible - Elevate legs at Dialysis Wound #9 Left,Posterior Lower Leg: 3 Layer Compression System - Bilateral - Profore lite Elevate legs to the level of the heart and pump ankles  as often as possible - Elevate legs at Dialysis Off-Loading: Wound #10 Left Gluteus: Turn and reposition every 2 hours Wound #11 Right Gluteus: Crago, Calysta H.  ([id number]) Turn and reposition every 2 hours Home Health: Wound #1 Right,Anterior Lower Leg: Continue Home Health Visits - [facility]: Monday, Wednesday and Friday **WRAP GOES FROM BASE OF TOES TO THREE FINGER WIDTHS BENEATH BEND IN KNEE. Home Health Nurse may visit PRN to address patient s wound care needs. FACE TO FACE ENCOUNTER: MEDICARE and MEDICAID PATIENTS: I certify that this patient is under my care and that I had a face-to-face encounter that meets the physician face-to-face encounter requirements with this patient on this date. The encounter with the patient was in whole or in part for the following MEDICAL CONDITION: (primary reason for Home Healthcare) MEDICAL NECESSITY: I certify, that based on my findings, NURSING services are a medically necessary home health service. HOME BOUND STATUS: I certify that my clinical findings support that this patient is homebound (i.e., Due to illness or injury, pt requires aid of supportive devices such as crutches, cane, wheelchairs, walkers, the use of special transportation or the assistance of another person to leave their place of residence. There is a normal inability to leave the home and doing so requires considerable and taxing effort. Other absences are for medical reasons / religious services and are infrequent or of short duration when for other reasons). If current dressing causes regression in wound condition, may D/C ordered dressing product/s and apply Normal Saline Moist Dressing daily until next Wound Healing Center / Other MD appointment. Notify Wound Healing Center of regression in wound condition at [phone]. Please direct any NON-WOUND related issues/requests for orders to patient's Primary Care Physician Wound #3 Left,Lateral Lower Leg: Continue Home Health Visits - [facility]: Monday, Wednesday and Friday **WRAP GOES FROM BASE OF TOES TO THREE FINGER WIDTHS BENEATH BEND IN KNEE. Home Health Nurse may visit PRN to address  patient s wound care needs. FACE TO FACE ENCOUNTER: MEDICARE and MEDICAID PATIENTS: I certify that this patient is under my care and that I had a face-to-face encounter that meets the physician face-to-face encounter requirements with this patient on this date. The encounter with the patient was in whole or in part for the following MEDICAL CONDITION: (primary reason for Home Healthcare) MEDICAL NECESSITY: I certify, that based on my findings, NURSING services are a medically necessary home health service. HOME BOUND STATUS: I certify that my clinical findings support that this patient is homebound (i.e., Due to illness or injury, pt requires aid of supportive devices such as crutches, cane, wheelchairs, walkers, the use of special transportation or the assistance of another person to leave their place of residence. There is a normal inability to leave the home and doing so requires considerable and taxing effort. Other absences are for medical reasons / religious services and are infrequent or of short duration when for other reasons). If current dressing causes regression in wound condition, may D/C ordered dressing product/s and apply Normal Saline Moist Dressing daily until next Wound Healing Center / Other MD appointment. Notify Wound Healing Center of regression in wound condition at [phone]. Please direct any NON-WOUND related issues/requests for orders to patient's Primary Care Physician Wound #8 Right,Posterior Lower Leg: Continue Home Health Visits - [facility]: Monday, Wednesday and Friday **WRAP GOES FROM BASE OF TOES TO THREE FINGER WIDTHS BENEATH BEND IN KNEE. Home Health Nurse may visit PRN to address patient s wound care needs. FACE TO FACE ENCOUNTER:  MEDICARE and MEDICAID PATIENTS: I certify that this patient is under my care and that I had a face-to-face encounter that meets the physician face-to-face encounter requirements with this patient on this date. The encounter with  the patient was in whole or in part for the following MEDICAL CONDITION: (primary reason for Home Healthcare) MEDICAL NECESSITY: I certify, that based on my findings, NURSING services are a medically necessary home health service. HOME BOUND STATUS: I certify that my clinical findings support that this patient is homebound (i.e., Due to illness or injury, pt requires aid of supportive devices such as crutches, cane, wheelchairs, walkers, the use of special transportation or the assistance of another person to leave their place of residence. There is a normal inability to leave the home and doing so requires considerable and taxing effort. Other absences are Troeger, Aleesa H. ([id number]) for medical reasons / religious services and are infrequent or of short duration when for other reasons). If current dressing causes regression in wound condition, may D/C ordered dressing product/s and apply Normal Saline Moist Dressing daily until next Wound Healing Center / Other MD appointment. Notify Wound Healing Center of regression in wound condition at [phone]. Please direct any NON-WOUND related issues/requests for orders to patient's Primary Care Physician Wound #9 Left,Posterior Lower Leg: Continue Home Health Visits - [facility]: Monday, Wednesday and Friday **WRAP GOES FROM BASE OF TOES TO THREE FINGER WIDTHS BENEATH BEND IN KNEE. Home Health Nurse may visit PRN to address patient s wound care needs. FACE TO FACE ENCOUNTER: MEDICARE and MEDICAID PATIENTS: I certify that this patient is under my care and that I had a face-to-face encounter that meets the physician face-to-face encounter requirements with this patient on this date. The encounter with the patient was in whole or in part for the following MEDICAL CONDITION: (primary reason for Home Healthcare) MEDICAL NECESSITY: I certify, that based on my findings, NURSING services are a medically necessary home health service. HOME BOUND STATUS:  I certify that my clinical findings support that this patient is homebound (i.e., Due to illness or injury, pt requires aid of supportive devices such as crutches, cane, wheelchairs, walkers, the use of special transportation or the assistance of another person to leave their place of residence. There is a normal inability to leave the home and doing so requires considerable and taxing effort. Other absences are for medical reasons / religious services and are infrequent or of short duration when for other reasons). If current dressing causes regression in wound condition, may D/C ordered dressing product/s and apply Normal Saline Moist Dressing daily until next Wound Healing Center / Other MD appointment. Notify Wound Healing Center of regression in wound condition at [phone]. Please direct any NON-WOUND related issues/requests for orders to patient's Primary Care Physician The following medication(s) was prescribed: doxycycline hyclate oral 100 mg tablet tablet oral Silver alginate. 3 layer compression bandage. Home health to change twice weekly. Doxycycline. Patient said she would agree to punch biopsy next week. Electronic Signature(s) Signed: 06/21/2015 2:46:32 PM By: Madelaine Bhat MD Entered By: Madelaine Bhat on 06/21/2015 13:57:17 Wieman, [name] ([id number]) -------------------------------------------------------------------------------- SuperBill Details Patient Name: Sinatra, Aneta H. Date of Service: 06/21/2015 Medical Record Patient Account Number: [id number] Number: Treating RN: Huel Coventry 16-Dec-1955 (59 y.o. Other Clinician: Date of Birth/Sex: Female) Treating BURNS III, Primary Care Physician/Extender: Karn Cassis Physician: Weeks in Treatment: 20 Referring Physician: Fidel Levy Diagnosis Coding ICD-10 Codes Code Description [phone] Type 2 diabetes mellitus with  other skin ulcer N18.6 End stage renal disease L97.222  Non-pressure chronic ulcer of left calf with fat layer exposed L97.212 Non-pressure chronic ulcer of right calf with fat layer exposed R60.0 Localized edema L89.152 Pressure ulcer of sacral region, stage 2 Facility Procedures CPT4 Code: [id number] Description: 11042 - DEB SUBQ TISSUE 20 SQ CM/< ICD-10 Description Diagnosis L97.222 Non-pressure chronic ulcer of left calf with fat l L97.212 Non-pressure chronic ulcer of right calf with fat Modifier: ayer exposed layer exposed Quantity: 1 CPT4 Code: [id number] Description: 11045 - DEB SUBQ TISS EA ADDL 20CM ICD-10 Description Diagnosis L97.222 Non-pressure chronic ulcer of left calf with fat l L97.212 Non-pressure chronic ulcer of right calf with fat Modifier: ayer exposed layer exposed Quantity: 6 Physician Procedures CPT4 Code: [id number] Description: 11042 - WC PHYS SUBQ TISS 20 SQ CM ICD-10 Description Diagnosis L97.222 Non-pressure chronic ulcer of left calf with fat la L97.212 Non-pressure chronic ulcer of right calf with fat l Modifier: yer exposed ayer exposed Quantity: 1 CPT4 Code: [id number] Vanderwall, PAM Description: 11045 - WC PHYS SUBQ TISS EA ADDL 20 CM ELA H. ([id number]) Modifier: Quantity: 6 Electronic Signature(s) Signed: 06/21/2015 2:46:32 PM By: Madelaine BhatBurns, III, Camille Thau MD Entered By: Madelaine BhatBurns, III, Danyah Guastella on 06/21/2015 13:57:37

## 2015-06-24 NOTE — Progress Notes (Signed)
LAMA, NARAYANAN ([id number]) Visit Report for 06/21/2015 Arrival Information Details Patient Name: [name], [name]. Date of Service: 06/21/2015 8:15 AM Medical Record Patient Account Number: [id number] Number: Treating RN: Huel Coventry 1955/11/05 (59 y.o. Other Clinician: Date of Birth/Sex: Female) Treating BURNS III, Primary Care Physician: Fidel Levy Physician/Extender: Zollie Beckers Referring Physician: Jones Broom in Treatment: 20 Visit Information History Since Last Visit Added or deleted any medications: No Patient Arrived: Ambulatory Any new allergies or adverse reactions: No Arrival Time: 08:17 Had a fall or experienced change in No Accompanied By: self activities of daily living that may affect Transfer Assistance: None risk of falls: Patient Identification Verified: Yes Signs or symptoms of abuse/neglect since last No Secondary Verification Process Yes visito Completed: Hospitalized since last visit: No Patient Requires Transmission- No Has Dressing in Place as Prescribed: Yes Based Precautions: Has Compression in Place as Prescribed: Yes Patient Has Alerts: Yes Pain Present Now: No Patient Alerts: DMII ABI [place] Bilateral Electronic Signature(s) Signed: 06/23/2015 3:49:12 PM By: Elliot Gurney, RN, BSN, Kim RN, BSN Entered By: Elliot Gurney, RN, BSN, Kim on 06/21/2015 08:18:40 [name] ([id number]) -------------------------------------------------------------------------------- Encounter Discharge Information Details Patient Name: [name], [name] Date of Service: 06/21/2015 8:15 AM Medical Record Patient Account Number: [id number] Number: Treating RN: Huel Coventry 1956/05/04 (59 y.o. Other Clinician: Date of Birth/Sex: Female) Treating BURNS III, Primary Care Physician: Fidel Levy Physician/Extender: Zollie Beckers Referring Physician: Jones Broom in Treatment: 20 Encounter Discharge Information Items Discharge  Pain Level: 0 Discharge Condition: Stable Ambulatory Status: Ambulatory Discharge Destination: Home Transportation: Private Auto Accompanied By: self Schedule Follow-up Appointment: Yes Medication Reconciliation completed and provided to Patient/Care Yes [name]: Provided on Clinical Summary of Care: 06/21/2015 Form Type Recipient Paper Patient PM Electronic Signature(s) Signed: 06/23/2015 3:49:12 PM By: Elliot Gurney RN, BSN, Kim RN, BSN Previous Signature: 06/21/2015 9:20:57 AM Version By: Gwenlyn Perking Entered By: Elliot Gurney RN, BSN, Kim on 06/21/2015 09:22:38 [name] ([id number]) -------------------------------------------------------------------------------- Lower Extremity Assessment Details Patient Name: [name], [name] Date of Service: 06/21/2015 8:15 AM Medical Record Patient Account Number: [id number] Number: Treating RN: Huel Coventry 1956-06-02 (59 y.o. Other Clinician: Date of Birth/Sex: Female) Treating BURNS III, Primary Care Physician: Fidel Levy Physician/Extender: Zollie Beckers Referring Physician: Jones Broom in Treatment: 20 Edema Assessment Assessed: [Left: No] [Right: No] E[Left: dema] [Right: :] Calf Left: Right: Point of Measurement: 34 cm From Medial Instep 46.4 cm 45 cm Ankle Left: Right: Point of Measurement: 10 cm From Medial Instep 25.4 cm 24.8 cm Vascular Assessment Pulses: Posterior Tibial Dorsalis Pedis Palpable: [Left:Yes] [Right:Yes] Extremity colors, hair growth, and conditions: Extremity Color: [Left:Red] [Right:Hyperpigmented] Hair Growth on Extremity: [Left:No] [Right:No] Temperature of Extremity: [Left:Warm] [Right:Warm] Capillary Refill: [Left:< 3 seconds] [Right:< 3 seconds] Toe Nail Assessment Left: Right: Thick: No No Discolored: No No Deformed: No No Improper Length and Hygiene: No No Electronic Signature(s) Signed: 06/23/2015 3:49:12 PM By: Elliot Gurney, RN, BSN, Kim RN, BSN Entered By: Elliot Gurney, RN,  BSN, Kim on 06/21/2015 08:35:39 [name] ([id number]) [name], [name] ([id number]) -------------------------------------------------------------------------------- Multi Wound Chart Details Patient Name: [name], [name] Date of Service: 06/21/2015 8:15 AM Medical Record Patient Account Number: [id number] Number: Treating RN: Huel Coventry 1956-07-08 (59 y.o. 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] [10:Pressure Ulcer] [11:Pressure Ulcer] Comorbid History: [1:Hypertension, Type II Diabetes] [10:Hypertension, Type II Diabetes] [11:N/A] Date Acquired: [1:01/09/2015] [10:05/31/2015] [11:05/31/2015] Weeks of Treatment: [1:20] [10:2] [11:2] Wound Status: [1:Open] [10:Open] [11:Open] Measurements L x W x D 18.5x14x0.3 [10:0.4x0.5x0.1] [11:0.2x0.3x0.1] (cm) Area (cm) : [1:203.418] [10:0.157] [11:0.047] Volume (cm) : [1:61.025] [10:0.016] [11:0.005] % Reduction in Area: [1:-432704.30%] [10:98.30%] [11:94.00%] % Reduction in Volume: -1220400.00% [10:98.30%] [11:96.80%] Classification: [1:Full Thickness Without Exposed Support Structures] [10:Category/Stage II] [11:Category/Stage II] HBO Classification: [1:Grade 2] [10:N/A] [11:N/A] Exudate Amount: [1:Medium] [10:Small] [11:N/A] Exudate Type: [1:Serous] [10:Serous] [11:N/A] Exudate Color: [1:amber] [10:amber] [11:N/A] Wound Margin:  [1:Thickened] [10:Flat and Intact] [11:N/A] Granulation Amount: [1:Small (1-33%)] [10:Large (67-100%)] [11:N/A] Granulation Quality: [1:Pink] [10:Red] [11:N/A] Necrotic Amount: [1:Large (67-100%)] [10:None Present (0%)] [11:N/A] Necrotic Tissue: [1:N/A] [10:N/A] [11:N/A] Exposed Structures: [11:N/A] Fascia: No Fascia: No Fat: No Fat: No Tendon: No Tendon: No Muscle: No Muscle: No Joint: No Joint: No Bone: No Bone: No Limited to Skin Limited to Skin Breakdown Breakdown Epithelialization: None None N/A Periwound Skin Texture: Scarring: Yes Edema: No No Abnormalities Noted Edema: No Excoriation: No Excoriation: No Induration: No Induration: No Callus: No Callus: No Crepitus: No Crepitus: No Fluctuance: No Fluctuance: No Friable: No Friable: No Rash: No Rash: No Scarring: No Periwound Skin Moist: Yes Maceration: No No Abnormalities Noted Moisture: Maceration: No Moist: No Dry/Scaly: No Dry/Scaly: No Periwound Skin Color: Atrophie Blanche: No Atrophie Blanche: No No Abnormalities Noted Cyanosis: No Cyanosis: No Ecchymosis: No Ecchymosis: No Erythema: No Erythema: No Hemosiderin Staining: No Hemosiderin Staining: No Mottled: No Mottled: No Pallor: No Pallor: No Rubor: No Rubor: No Tenderness on Yes No No Palpation: Wound Preparation: Ulcer Cleansing: Ulcer Cleansing: Other: N/A Rinsed/Irrigated with soap and water Saline Topical Anesthetic Topical Anesthetic Applied: None Applied: Other: lidocaine 4% Wound Number: [id number]  Photos: No Photos No Photos No Photos Wound Location: Left Lower Leg - Lateral Right Lower Leg - Left, Posterior Lower Leg Posterior Wounding Event: Gradually Appeared Gradually Appeared Gradually Appeared Primary Etiology: Calciphylaxis Calciphylaxis Calciphylaxis Comorbid History: Hypertension, Type II Hypertension, Type II N/A Diabetes Diabetes Date Acquired: 01/09/2015 03/27/2015 03/13/2015 Weeks of Treatment: 20 11 8  Wound  Status: Open Open Open Wigley, Christell H. ([id number]) Measurements L x W x D 20x10x0.9 10x11x0.3 2x2.5x0.6 (cm) Area (cm) : 157.08 86.394 3.927 Volume (cm) : 141.372 25.918 2.356 % Reduction in Area: -1328.50% -4789.30% 75.00% % Reduction in Volume: -12752.00% -14542.90% 25.00% Classification: Full Thickness Without Full Thickness Without Full Thickness Without Exposed Support Exposed Support Exposed Support Structures Structures Structures HBO Classification: Grade 1 Grade 1 N/A Exudate Amount: Large Large N/A Exudate Type: Serous Serous N/A Exudate Color: amber amber N/A Wound Margin: Flat and Intact Flat and Intact N/A Granulation Amount: Medium (34-66%) Medium (34-66%) N/A Granulation Quality: Red Red N/A Necrotic Amount: Medium (34-66%) Medium (34-66%) N/A Necrotic Tissue: Adherent Slough Eschar, Adherent Slough N/A Exposed Structures: Fascia: No Fascia: No N/A Fat: No Fat: No Tendon: No Tendon: No Muscle: No Muscle: No Joint: No Joint: No Bone: No Bone: No Limited to Skin Limited to Skin Breakdown Breakdown Epithelialization: None None N/A Periwound Skin Texture: Edema: No Edema: No No Abnormalities Noted Excoriation: No Excoriation: No Induration: No Induration: No Callus: No Callus: No Crepitus: No Crepitus: No Fluctuance: No Fluctuance: No Friable: No Friable: No Rash: No Rash: No Scarring:  No Scarring: No Periwound Skin Maceration: No Maceration: No No Abnormalities Noted Moisture: Moist: No Moist: No Dry/Scaly: No Dry/Scaly: No Periwound Skin Color: Atrophie Blanche: No Atrophie Blanche: No No Abnormalities Noted Cyanosis: No Cyanosis: No Ecchymosis: No Ecchymosis: No Erythema: No Erythema: No Hemosiderin Staining: No Hemosiderin Staining: No Mottled: No Mottled: No Pallor: No Pallor: No Rubor: No Rubor: No Tenderness on No No No Palpation: Wound Preparation: N/A Windom, Marielis H. ([id number]) Ulcer Cleansing: Other:  Ulcer Cleansing: Other: soap and water soap and water Topical Anesthetic Topical Anesthetic Applied: Other: lidocaine Applied: Other: lidocaine 4% 4% Treatment Notes Electronic Signature(s) Signed: 06/23/2015 3:49:12 PM By: Elliot GurneyWoody, RN, BSN, Kim RN, BSN Entered By: Elliot GurneyWoody, RN, BSN, Kim on 06/21/2015 08:47:23 [name], [name] EdelmanPAMELA H. ([id number]) -------------------------------------------------------------------------------- Multi-Disciplinary Care Plan Details Patient Name: [name], [name] Date of Service: 06/21/2015 8:15 AM Medical Record Patient Account Number: [id number] Number: Treating RN: Huel CoventryWoody, Kim 08-27-1955 (59 y.o. Other Clinician: Date of Birth/Sex: Female) Treating BURNS III, Primary Care Physician: Fidel LevyHAWKINS JR, JAMES Physician/Extender: Zollie BeckersWALTER Referring Physician: Jones BroomHAWKINS JR, JAMES Weeks in Treatment: 20 Active Inactive Orientation to the Wound Care Program Nursing Diagnoses: Knowledge deficit related to the wound healing center program Goals: Patient/caregiver will verbalize understanding of the Wound Healing Center Program Date Initiated: 01/30/2015 Goal Status: Active Interventions: Provide education on orientation to the wound center Notes: Pain, Acute or Chronic Nursing Diagnoses: Pain, acute or chronic: actual or potential Goals: Patient will verbalize adequate pain control and receive pain control interventions during procedures as needed Date Initiated: 01/30/2015 Goal Status: Active Patient/caregiver will verbalize adequate pain control between visits Date Initiated: 01/30/2015 Goal Status: Active Interventions: Assess comfort goal upon admission Encourage patient to take pain medications as prescribed Notes: Venous Leg Ulcer Stucki, Dorea H. ([id number]) Nursing Diagnoses: Potential for venous Insuffiency (use before diagnosis confirmed) Goals: Non-invasive venous studies are completed as ordered Date Initiated: 01/30/2015 Goal Status:  Active Interventions: Assess peripheral edema status every visit. Notes: Wound/Skin Impairment Nursing Diagnoses: Impaired tissue integrity Goals: Ulcer/skin breakdown will have a volume reduction of 30% by week 4 Date Initiated: 01/30/2015 Goal Status: Active Ulcer/skin breakdown will have a volume reduction of 50% by week 8 Date Initiated: 01/30/2015 Goal Status: Active Ulcer/skin breakdown will have a volume reduction of 80% by week 12 Date Initiated: 01/30/2015 Goal Status: Active Ulcer/skin breakdown will heal within 14 weeks Date Initiated: 01/30/2015 Goal Status: Active Interventions: Assess ulceration(s) every visit Notes: Electronic Signature(s) Signed: 06/23/2015 3:49:12 PM By: Elliot GurneyWoody, RN, BSN, Kim RN, BSN Entered By: Elliot GurneyWoody, RN, BSN, Kim on 06/21/2015 08:47:12 Bronk, [name] EdelmanPAMELA H. ([id number]) -------------------------------------------------------------------------------- Pain Assessment Details Patient Name: [name], [name] Date of Service: 06/21/2015 8:15 AM Medical Record Patient Account Number: [id number] Number: Treating RN: Huel CoventryWoody, Kim 08-27-1955 (59 y.o. Other Clinician: Date of Birth/Sex: Female) Treating BURNS III, Primary Care Physician: Fidel LevyHAWKINS JR, JAMES Physician/Extender: Zollie BeckersWALTER Referring Physician: Jones BroomHAWKINS JR, JAMES Weeks in Treatment: 20 Active Problems Location of Pain Severity and Description of Pain Patient Has Paino No Site Locations Pain Management and Medication Current Pain Management: Electronic Signature(s) Signed: 06/23/2015 3:49:12 PM By: Elliot GurneyWoody, RN, BSN, Kim RN, BSN Entered By: Elliot GurneyWoody, RN, BSN, Kim on 06/21/2015 08:18:46 Steinhilber, [name] EdelmanPAMELA H. ([id number]) -------------------------------------------------------------------------------- Patient/Caregiver Education Details Patient Name: [name], [name] Date of Service: 06/21/2015 8:15 AM Medical Record Patient Account Number: [id number] Number: Treating RN: Huel CoventryWoody,  Kim 08-27-1955 (59 y.o. Other Clinician: Date of Birth/Gender: Female) Treating BURNS III, Primary Care Physician: Fidel LevyHAWKINS JR, JAMES Physician/Extender: Zollie BeckersWALTER Referring Physician: Jonna ClarkHAWKINS JR,  JAMES Weeks in Treatment: 20 Education Assessment Education Provided To: Patient Education Topics Provided Wound/Skin Impairment: Handouts: Caring for Your Ulcer Methods: Demonstration, Explain/Verbal Responses: State content correctly Electronic Signature(s) Signed: 06/23/2015 3:49:12 PM By: Elliot Gurney, RN, BSN, Kim RN, BSN Entered By: Elliot Gurney, RN, BSN, Kim on 06/21/2015 09:22:49 Cisnero, [name] ([id number]) -------------------------------------------------------------------------------- Wound Assessment Details Patient Name: Amato, Bao H. Date of Service: 06/21/2015 8:15 AM Medical Record Patient Account Number: [id number] Number: Treating RN: Huel Coventry [birth year] (59 y.o. Other Clinician: Date of Birth/Sex: Female) Treating BURNS III, Primary Care Physician: Fidel Levy Physician/Extender: Zollie Beckers Referring Physician: Jones Broom in Treatment: 20 Wound Status Wound Number: 1 Primary Etiology: Calciphylaxis Wound Location: Right Lower Leg - Anterior Wound Status: Open Wounding Event: Gradually Appeared Comorbid History: Hypertension, Type II Diabetes Date Acquired: 01/09/2015 Weeks Of Treatment: 20 Clustered Wound: No Photos Photo Uploaded By: Elliot Gurney, RN, BSN, Kim on 06/21/2015 16:36:42 Wound Measurements Length: (cm) 18.5 Width: (cm) 14 Depth: (cm) 0.3 Area: (cm) 203.418 Volume: (cm) 61.025 % Reduction in Area: -432704.3% % Reduction in Volume: -1220400% Epithelialization: None Wound Description Full Thickness Without Foul Odor After Classification: Exposed Support Structures Diabetic Severity Grade 2 (Wagner): Wound Margin: Thickened Exudate Amount: Medium Exudate Type: Serous Exudate Color: amber Cleansing: No Wound  Bed [name], [name] ([id number]) Granulation Amount: Small (1-33%) Exposed Structure Granulation Quality: Pink Fascia Exposed: No Necrotic Amount: Large (67-100%) Fat Layer Exposed: No Tendon Exposed: No Muscle Exposed: No Joint Exposed: No Bone Exposed: No Limited to Skin Breakdown Periwound Skin Texture Texture Color No Abnormalities Noted: No No Abnormalities Noted: No Callus: No Atrophie Blanche: No Crepitus: No Cyanosis: No Excoriation: No Ecchymosis: No Fluctuance: No Erythema: No Friable: No Hemosiderin Staining: No Induration: No Mottled: No Localized Edema: No Pallor: No Rash: No Rubor: No Scarring: Yes Temperature / Pain Moisture Tenderness on Palpation: Yes No Abnormalities Noted: No Dry / Scaly: No Maceration: No Moist: Yes Wound Preparation Ulcer Cleansing: Rinsed/Irrigated with Saline Topical Anesthetic Applied: Other: lidocaine 4%, Treatment Notes Wound #1 (Right, Anterior Lower Leg) 1. Cleansed with: Clean wound with Normal Saline Cleanse wound with antibacterial soap and water 2. Anesthetic Topical Lidocaine 4% cream to wound bed prior to debridement 4. Dressing Applied: Aquacel Ag 5. Secondary Dressing Applied ABD Pad 7. Secured with 3 Layer Compression System - Bilateral Notes Patient treating gluteus wounds her self with desitin NILLIE, BARTOLOTTA ([id number]) Electronic Signature(s) Signed: 06/23/2015 3:49:12 PM By: Elliot Gurney, RN, BSN, Kim RN, BSN Entered By: Elliot Gurney, RN, BSN, Kim on 06/21/2015 08:42:25 Binz, [name] ([id number]) -------------------------------------------------------------------------------- Wound Assessment Details Patient Name: Melito, Anesa H. Date of Service: 06/21/2015 8:15 AM Medical Record Patient Account Number: [id number] Number: Treating RN: Huel Coventry 12-31-1955 (59 y.o. Other Clinician: Date of Birth/Sex: Female) Treating BURNS III, Primary Care Physician: Fidel Levy Physician/Extender: Zollie Beckers Referring Physician: Jones Broom in Treatment: 20 Wound Status Wound Number: 10 Primary Etiology: Pressure Ulcer Wound Location: Left Gluteus Wound Status: Open Wounding Event: Gradually Appeared Comorbid History: Hypertension, Type II Diabetes Date Acquired: 05/31/2015 Weeks Of Treatment: 2 Clustered Wound: No Wound Measurements Length: (cm) 0.4 Width: (cm) 0.5 Depth: (cm) 0.1 Area: (cm) 0.157 Volume: (cm) 0.016 % Reduction in Area: 98.3% % Reduction in Volume: 98.3% Epithelialization: None Wound Description Classification: Category/Stage II Wound Margin: Flat and Intact Exudate Amount: Small Exudate Type: Serous Exudate Color: amber Wound Bed Granulation Amount: Large (67-100%) Exposed Structure Granulation Quality: Red Fascia Exposed: No Necrotic Amount: None Present (0%) Fat  Layer Exposed: No Tendon Exposed: No Muscle Exposed: No Joint Exposed: No Bone Exposed: No Limited to Skin Breakdown Periwound Skin Texture Texture Color No Abnormalities Noted: No No Abnormalities Noted: No Callus: No Atrophie Blanche: No Crepitus: No Cyanosis: No Toppin, Tyyonna H. ([id number]) Excoriation: No Ecchymosis: No Fluctuance: No Erythema: No Friable: No Hemosiderin Staining: No Induration: No Mottled: No Localized Edema: No Pallor: No Rash: No Rubor: No Scarring: No Moisture No Abnormalities Noted: No Dry / Scaly: No Maceration: No Moist: No Wound Preparation Ulcer Cleansing: Other: soap and water, Topical Anesthetic Applied: None Electronic Signature(s) Signed: 06/23/2015 3:49:12 PM By: Elliot Gurney, RN, BSN, Kim RN, BSN Entered By: Elliot Gurney, RN, BSN, Kim on 06/21/2015 08:46:54 [name], [name] ([id number]) -------------------------------------------------------------------------------- Wound Assessment Details Patient Name: [name], [name] Date of Service: 06/21/2015 8:15 AM Medical Record Patient Account  Number: [id number] Number: Treating RN: Huel Coventry 07-20-1955 (59 y.o. Other Clinician: Date of Birth/Sex: Female) Treating BURNS III, Primary Care Physician: Fidel Levy Physician/Extender: Zollie Beckers Referring Physician: Jones Broom in Treatment: 20 Wound Status Wound Number: 11 Primary Etiology: Pressure Ulcer Wound Location: Right Gluteus Wound Status: Open Wounding Event: Gradually Appeared Date Acquired: 05/31/2015 Weeks Of Treatment: 2 Clustered Wound: No Photos Photo Uploaded By: Elliot Gurney, RN, BSN, Kim on 06/21/2015 16:36:43 Wound Measurements Length: (cm) 0.2 Width: (cm) 0.3 Depth: (cm) 0.1 Area: (cm) 0.047 Volume: (cm) 0.005 % Reduction in Area: 94% % Reduction in Volume: 96.8% Wound Description Classification: Category/Stage II Periwound Skin Texture Texture Color No Abnormalities Noted: No No Abnormalities Noted: No Moisture No Abnormalities Noted: No Electronic Signature(s) LUCCIA, REINHEIMER ([id number]) Signed: 06/23/2015 3:49:12 PM By: Elliot Gurney, RN, BSN, Kim RN, BSN Entered By: Elliot Gurney, RN, BSN, Kim on 06/21/2015 08:46:39 [name], [name] ([id number]) -------------------------------------------------------------------------------- Wound Assessment Details Patient Name: Solly, Sybil H. Date of Service: 06/21/2015 8:15 AM Medical Record Patient Account Number: [id number] Number: Treating RN: Huel Coventry Jan 25, 1956 (59 y.o. Other Clinician: Date of Birth/Sex: Female) Treating BURNS III, Primary Care Physician: Fidel Levy Physician/Extender: Zollie Beckers Referring Physician: Jones Broom in Treatment: 20 Wound Status Wound Number: 3 Primary Etiology: Calciphylaxis Wound Location: Left Lower Leg - Lateral Wound Status: Open Wounding Event: Gradually Appeared Comorbid History: Hypertension, Type II Diabetes Date Acquired: 01/09/2015 Weeks Of Treatment: 20 Clustered Wound: No Photos Photo Uploaded  By: Elliot Gurney, RN, BSN, Kim on 06/21/2015 16:37:06 Wound Measurements Length: (cm) 20 Width: (cm) 10 Depth: (cm) 0.9 Area: (cm) 157.08 Volume: (cm) 141.372 % Reduction in Area: -1328.5% % Reduction in Volume: -12752% Epithelialization: None Wound Description Full Thickness Without Exposed Classification: Support Structures Diabetic Severity Grade 1 (Wagner): Wound Margin: Flat and Intact Exudate Amount: Large Exudate Type: Serous Exudate Color: amber Wound Bed Panameno, Keyaira H. ([id number]) Granulation Amount: Medium (34-66%) Exposed Structure Granulation Quality: Red Fascia Exposed: No Necrotic Amount: Medium (34-66%) Fat Layer Exposed: No Necrotic Quality: Adherent Slough Tendon Exposed: No Muscle Exposed: No Joint Exposed: No Bone Exposed: No Limited to Skin Breakdown Periwound Skin Texture Texture Color No Abnormalities Noted: No No Abnormalities Noted: No Callus: No Atrophie Blanche: No Crepitus: No Cyanosis: No Excoriation: No Ecchymosis: No Fluctuance: No Erythema: No Friable: No Hemosiderin Staining: No Induration: No Mottled: No Localized Edema: No Pallor: No Rash: No Rubor: No Scarring: No Moisture No Abnormalities Noted: No Dry / Scaly: No Maceration: No Moist: No Wound Preparation Ulcer Cleansing: Other: soap and water, Topical Anesthetic Applied: Other: lidocaine 4%, Treatment Notes Wound #3 (Left, Lateral Lower Leg) 1. Cleansed with: Clean  wound with Normal Saline Cleanse wound with antibacterial soap and water 2. Anesthetic Topical Lidocaine 4% cream to wound bed prior to debridement 4. Dressing Applied: Aquacel Ag 5. Secondary Dressing Applied ABD Pad 7. Secured with 3 Layer Compression System - Bilateral Notes Patient treating gluteus wounds her self with desitin SHAMARA, SOZA ([id number]) Electronic Signature(s) Signed: 06/23/2015 3:49:12 PM By: Elliot Gurney, RN, BSN, Kim RN, BSN Entered By: Elliot Gurney, RN, BSN, Kim on  06/21/2015 08:42:54 [place], [name] ([id number]) -------------------------------------------------------------------------------- Wound Assessment Details Patient Name: Slinker, Melisse H. Date of Service: 06/21/2015 8:15 AM Medical Record Patient Account Number: [id number] Number: Treating RN: Huel Coventry 1955/08/29 (59 y.o. Other Clinician: Date of Birth/Sex: Female) Treating BURNS III, Primary Care Physician: Fidel Levy Physician/Extender: Zollie Beckers Referring Physician: Jones Broom in Treatment: 20 Wound Status Wound Number: 8 Primary Etiology: Calciphylaxis Wound Location: Right Lower Leg - Posterior Wound Status: Open Wounding Event: Gradually Appeared Comorbid History: Hypertension, Type II Diabetes Date Acquired: 03/27/2015 Weeks Of Treatment: 11 Clustered Wound: No Photos Photo Uploaded By: Elliot Gurney, RN, BSN, Kim on 06/21/2015 16:37:06 Wound Measurements Length: (cm) 10 Width: (cm) 11 Depth: (cm) 0.3 Area: (cm) 86.394 Volume: (cm) 25.918 % Reduction in Area: -4789.3% % Reduction in Volume: -14542.9% Epithelialization: None Wound Description Full Thickness Without Exposed Classification: Support Structures Diabetic Severity Grade 1 (Wagner): Wound Margin: Flat and Intact Exudate Amount: Large Exudate Type: Serous Exudate Color: amber Wound Bed Down, Angelisa H. ([id number]) Granulation Amount: Medium (34-66%) Exposed Structure Granulation Quality: Red Fascia Exposed: No Necrotic Amount: Medium (34-66%) Fat Layer Exposed: No Necrotic Quality: Eschar, Adherent Slough Tendon Exposed: No Muscle Exposed: No Joint Exposed: No Bone Exposed: No Limited to Skin Breakdown Periwound Skin Texture Texture Color No Abnormalities Noted: No No Abnormalities Noted: No Callus: No Atrophie Blanche: No Crepitus: No Cyanosis: No Excoriation: No Ecchymosis: No Fluctuance: No Erythema: No Friable: No Hemosiderin Staining:  No Induration: No Mottled: No Localized Edema: No Pallor: No Rash: No Rubor: No Scarring: No Moisture No Abnormalities Noted: No Dry / Scaly: No Maceration: No Moist: No Wound Preparation Ulcer Cleansing: Other: soap and water, Topical Anesthetic Applied: Other: lidocaine 4%, Treatment Notes Wound #8 (Right, Posterior Lower Leg) 1. Cleansed with: Clean wound with Normal Saline Cleanse wound with antibacterial soap and water 2. Anesthetic Topical Lidocaine 4% cream to wound bed prior to debridement 4. Dressing Applied: Aquacel Ag 5. Secondary Dressing Applied ABD Pad 7. Secured with 3 Layer Compression System - Bilateral Notes Patient treating gluteus wounds her self with desitin MAKAYLIN, CARLO ([id number]) Electronic Signature(s) Signed: 06/23/2015 3:49:12 PM By: Elliot Gurney, RN, BSN, Kim RN, BSN Entered By: Elliot Gurney, RN, BSN, Kim on 06/21/2015 08:43:18 Ghanem, [name] ([id number]) -------------------------------------------------------------------------------- Wound Assessment Details Patient Name: Seelman, Amirrah H. Date of Service: 06/21/2015 8:15 AM Medical Record Patient Account Number: [id number] Number: Treating RN: Huel Coventry January 02, 1956 (59 y.o. Other Clinician: Date of Birth/Sex: Female) Treating BURNS III, Primary Care Physician: Fidel Levy Physician/Extender: Zollie Beckers Referring Physician: Jones Broom in Treatment: 20 Wound Status Wound Number: 9 Primary Etiology: Calciphylaxis Wound Location: Left, Posterior Lower Leg Wound Status: Open Wounding Event: Gradually Appeared Date Acquired: 03/13/2015 Weeks Of Treatment: 8 Clustered Wound: No Photos Photo Uploaded By: Elliot Gurney, RN, BSN, Kim on 06/21/2015 16:38:07 Wound Measurements Length: (cm) 2 Width: (cm) 2.5 Depth: (cm) 0.6 Area: (cm) 3.927 Volume: (cm) 2.356 % Reduction in Area: 75% % Reduction in Volume: 25% Wound Description Full Thickness Without  Exposed Classification: Support Structures Periwound  Skin Texture Texture Color No Abnormalities Noted: No No Abnormalities Noted: No Moisture No Abnormalities Noted: No Dibuono, Jasiya H. ([id number]) Treatment Notes Wound #9 (Left, Posterior Lower Leg) 1. Cleansed with: Clean wound with Normal Saline Cleanse wound with antibacterial soap and water 2. Anesthetic Topical Lidocaine 4% cream to wound bed prior to debridement 4. Dressing Applied: Aquacel Ag 5. Secondary Dressing Applied ABD Pad 7. Secured with 3 Layer Compression System - Bilateral Notes Patient treating gluteus wounds her self with desitin Electronic Signature(s) Signed: 06/23/2015 3:49:12 PM By: Elliot Gurney, RN, BSN, Kim RN, BSN Entered By: Elliot Gurney, RN, BSN, Kim on 06/21/2015 08:42:08 [place], [name] ([id number]) -------------------------------------------------------------------------------- Vitals Details Patient Name: Kiser, Jahzaria H. Date of Service: 06/21/2015 8:15 AM Medical Record Patient Account Number: [id number] Number: Treating RN: Huel Coventry 08-Jan-1956 (59 y.o. Other Clinician: Date of Birth/Sex: Female) Treating BURNS III, Primary Care Physician: Fidel Levy Physician/Extender: Zollie Beckers Referring Physician: Jones Broom in Treatment: 20 Vital Signs Time Taken: 08:18 Temperature (F): 97.5 Height (in): 68 Pulse (bpm): 95 Weight (lbs): 294 Respiratory Rate (breaths/min): 18 Body Mass Index (BMI): 44.7 Blood Pressure (mmHg): 142/116 Reference Range: 80 - 120 mg / dl Electronic Signature(s) Signed: 06/23/2015 3:49:12 PM By: Elliot Gurney, RN, BSN, Kim RN, BSN Entered By: Elliot Gurney, RN, BSN, Kim on 06/21/2015 08:21:47

## 2015-06-28 ENCOUNTER — Encounter: Payer: BC Managed Care – PPO | Admitting: Surgery

## 2015-06-28 DIAGNOSIS — R600 Localized edema: Secondary | ICD-10-CM | POA: Diagnosis not present

## 2015-06-28 DIAGNOSIS — L97222 Non-pressure chronic ulcer of left calf with fat layer exposed: Secondary | ICD-10-CM | POA: Diagnosis not present

## 2015-06-28 DIAGNOSIS — L97829 Non-pressure chronic ulcer of other part of left lower leg with unspecified severity: Secondary | ICD-10-CM | POA: Diagnosis not present

## 2015-06-28 DIAGNOSIS — N186 End stage renal disease: Secondary | ICD-10-CM | POA: Diagnosis not present

## 2015-06-28 DIAGNOSIS — E11622 Type 2 diabetes mellitus with other skin ulcer: Secondary | ICD-10-CM | POA: Diagnosis not present

## 2015-06-28 DIAGNOSIS — Z992 Dependence on renal dialysis: Secondary | ICD-10-CM | POA: Diagnosis not present

## 2015-06-28 DIAGNOSIS — L97212 Non-pressure chronic ulcer of right calf with fat layer exposed: Secondary | ICD-10-CM | POA: Diagnosis not present

## 2015-06-29 DIAGNOSIS — E1165 Type 2 diabetes mellitus with hyperglycemia: Secondary | ICD-10-CM | POA: Diagnosis not present

## 2015-06-29 DIAGNOSIS — Z794 Long term (current) use of insulin: Secondary | ICD-10-CM | POA: Diagnosis not present

## 2015-06-29 LAB — SURGICAL PATHOLOGY

## 2015-06-29 NOTE — Progress Notes (Signed)
CAMAY, PEDIGO ([id number]) Visit Report for 06/28/2015 Arrival Information Details Patient Name: [name], [name]. Date of Service: 06/28/2015 8:15 AM Medical Record Patient Account Number: [id number] Number: Treating RN: Phillis Haggis Nov 04, 1955 (59 y.o. Other Clinician: Date of Birth/Sex: Female) Treating BURNS III, Primary Care Physician: Fidel Levy Physician/Extender: Zollie Beckers Referring Physician: Jones Broom in Treatment: 21 Visit Information History Since Last Visit All ordered tests and consults were completed: No Patient Arrived: Ambulatory Added or deleted any medications: No Arrival Time: 08:14 Any new allergies or adverse reactions: No Accompanied By: self Had a fall or experienced change in No Transfer Assistance: None activities of daily living that may affect Patient Identification Verified: Yes risk of falls: Secondary Verification Process Yes Signs or symptoms of abuse/neglect since last No Completed: visito Patient Requires Transmission- No Hospitalized since last visit: No Based Precautions: Pain Present Now: No Patient Has Alerts: Yes Patient Alerts: DMII ABI [place] Bilateral Electronic Signature(s) Signed: 06/28/2015 5:20:56 PM By: Alejandro Mulling Entered By: Alejandro Mulling on 06/28/2015 08:14:42 Greenlaw, Scottie H. ([id number]) -------------------------------------------------------------------------------- Clinic Level of Care Assessment Details Patient Name: [name], [name] Date of Service: 06/28/2015 8:15 AM Medical Record Patient Account Number: [id number] Number: Treating RN: Phillis Haggis [birth year] (59 y.o. Other Clinician: Date of Birth/Sex: Female) Treating BURNS III, Primary Care Physician: Fidel Levy Physician/Extender: Zollie Beckers Referring Physician: Jones Broom in Treatment: 21 Clinic Level of Care Assessment Items TOOL 1 Quantity Score  - Use when EandM and  Procedure is performed on INITIAL visit 0 ASSESSMENTS - Nursing Assessment / Reassessment  - General Physical Exam (combine w/ comprehensive assessment (listed just 0 below) when performed on new pt. evals)  - Comprehensive Assessment (HX, ROS, Risk Assessments, Wounds Hx, etc.) 0 ASSESSMENTS - Wound and Skin Assessment / Reassessment  - Dermatologic / Skin Assessment (not related to wound area) 0 ASSESSMENTS - Ostomy and/or Continence Assessment and Care  - Incontinence Assessment and Management 0  - Ostomy Care Assessment and Management (repouching, etc.) 0 PROCESS - Coordination of Care  - Simple Patient / Family Education for ongoing care 0  - Complex (extensive) Patient / Family Education for ongoing care 0  - Staff obtains Chiropractor, Records, Test Results / Process Orders 0  - Staff telephones HHA, Nursing Homes / Clarify orders / etc 0  - Routine Transfer to another Facility (non-emergent condition) 0  - Routine Hospital Admission (non-emergent condition) 0  - New Admissions / Manufacturing engineer / Ordering NPWT, Apligraf, etc. 0  - Emergency Hospital Admission (emergent condition) 0 PROCESS - Special Needs  - Pediatric / Minor Patient Management 0 Flurry, [name] ([id number])  - Isolation Patient Management 0  - Hearing / Language / Visual special needs 0  - Assessment of Community assistance (transportation, D/C planning, etc.) 0  - Additional assistance / Altered mentation 0  - Support Surface(s) Assessment (bed, cushion, seat, etc.) 0 INTERVENTIONS - Miscellaneous  - External ear exam 0  - Patient Transfer (multiple staff / Nurse, adult / Similar devices) 0  - Simple Staple / Suture removal (25 or less) 0  - Complex Staple / Suture removal (26 or more) 0  - Hypo/Hyperglycemic Management (do not check if billed separately) 0  - Ankle / Brachial Index (ABI) - do not check if billed separately 0 Has the patient been  seen at the hospital within the last three years: Yes Total Score: 0 Level Of Care: ____ Electronic Signature(s) Signed: 06/28/2015 5:20:56 PM By:  Alejandro Mulling Entered By: Alejandro Mulling on 06/28/2015 09:47:32 Maheu, [name] ([id number]) -------------------------------------------------------------------------------- Encounter Discharge Information Details Patient Name: Stahl, [name] Date of Service: 06/28/2015 8:15 AM Medical Record Patient Account Number: [id number] Number: Treating RN: Phillis Haggis [birth year] (59 y.o. Other Clinician: Date of Birth/Sex: Female) Treating BURNS III, Primary Care Physician: Fidel Levy Physician/Extender: Zollie Beckers Referring Physician: Jones Broom in Treatment: 21 Encounter Discharge Information Items Discharge Pain Level: 0 Discharge Condition: Stable Ambulatory Status: Ambulatory Discharge Destination: Home Transportation: Private Auto Accompanied By: self Schedule Follow-up Appointment: Yes Medication Reconciliation completed and provided to Patient/Care Yes Kimmora Risenhoover: Provided on Clinical Summary of Care: 06/28/2015 Form Type Recipient Paper Patient PM Electronic Signature(s) Signed: 06/28/2015 5:20:56 PM By: Alejandro Mulling Previous Signature: 06/28/2015 9:47:48 AM Version By: Gwenlyn Perking Entered By: Alejandro Mulling on 06/28/2015 14:42:16 Mauzy, Brentney H. ([id number]) -------------------------------------------------------------------------------- Lower Extremity Assessment Details Patient Name: [name], [name] Date of Service: 06/28/2015 8:15 AM Medical Record Patient Account Number: [id number] Number: Treating RN: Phillis Haggis Nov 24, 1955 (59 y.o. Other Clinician: Date of Birth/Sex: Female) Treating BURNS III, Primary Care Physician: Fidel Levy Physician/Extender: Zollie Beckers Referring Physician: Jones Broom in Treatment: 21 Edema  Assessment Assessed: [Left: No] [Right: No] Edema: [Left: Yes] [Right: Yes] Calf Left: Right: Point of Measurement: cm From Medial Instep 49.1 cm 44.5 cm Ankle Left: Right: Point of Measurement: cm From Medial Instep 26.8 cm 24.8 cm Vascular Assessment Pulses: Posterior Tibial Dorsalis Pedis Palpable: [Left:Yes] [Right:Yes] Extremity colors, hair growth, and conditions: Extremity Color: [Left:Hyperpigmented] [Right:Hyperpigmented] Hair Growth on Extremity: [Left:No] [Right:No] Temperature of Extremity: [Left:Warm] [Right:Warm] Capillary Refill: [Left:< 3 seconds] [Right:< 3 seconds] Toe Nail Assessment Left: Right: Thick: No No Discolored: No No Deformed: No No Improper Length and Hygiene: No No Electronic Signature(s) Signed: 06/28/2015 5:20:56 PM By: Alejandro Mulling Entered By: Alejandro Mulling on 06/28/2015 08:42:17 Wray, Yajayra H. ([id number]) Labrosse, Sharonann H. ([id number]) -------------------------------------------------------------------------------- Multi Wound Chart Details Patient Name: Dechaine, Laetitia H. Date of Service: 06/28/2015 8:15 AM Medical Record Patient Account Number: [id number] Number: Treating RN: Phillis Haggis 03/08/1956 (59 y.o. 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] [11:Pressure Ulcer] Comorbid History: [1:Hypertension,  Type II Diabetes] [10:Hypertension, Type II Diabetes] [11:Hypertension, Type II Diabetes] Date Acquired: [1:01/09/2015] [10:05/31/2015] [11:05/31/2015] Weeks of Treatment: [1:21] [10:3] [11:3] Wound Status: [1:Open] [10:Open] [11:Open] Measurements L x W x D 19x15x0.3 [10:4x2x0.1] [11:0.2x0.3x0.1] (cm) Area (cm) : [1:223.838] [10:6.283] [11:0.047] Volume (cm) : [1:67.152] [10:0.628] [11:0.005] % Reduction in Area: [1:-476151.10%] [10:33.30%] [11:94.00%] % Reduction in Volume: -1342940.00% [10:33.30%] [11:96.80%] Classification: [1:Full Thickness Without Exposed Support Structures] [10:Category/Stage II] [11:Category/Stage II] HBO Classification: [1:Grade 2] [10:N/A] [11:N/A] Exudate Amount: [1:Large] [10:Medium] [11:Medium] Exudate Type: [1:Serous] [10:Serosanguineous] [11:Serosanguineous] Exudate Color: [1:amber] [10:red, brown] [11:red, brown] Wound Margin: [1:Thickened] [10:Thickened] [11:Flat and Intact] Granulation Amount: [1:Small (1-33%)] [10:Large (67-100%)] [11:Large (67-100%)] Granulation Quality: [1:Pink] [10:Red] [11:Red] Necrotic Amount: [1:Large (67-100%)] [10:Small (1-33%)] [11:None Present (0%)] Necrotic Tissue: [1:Eschar, Adherent Slough Eschar] [11:N/A] Exposed Structures: [11:N/A] Fascia: No Fascia: No Fat: No Fat: No Tendon: No Tendon: No Muscle: No Muscle: No Joint: No Joint:  No Bone: No Bone: No Limited to Skin Limited to Skin Breakdown Breakdown Epithelialization: None None None Debridement: Debridement (16109- N/A N/A 11047) Time-Out Taken: Yes N/A N/A Pain Control: Lidocaine 4% Topical N/A N/A Solution Tissue Debrided: Fibrin/Slough, Fat, N/A N/A Subcutaneous Level: Skin/Subcutaneous N/A N/A Tissue Debridement Area (sq 50 N/A N/A cm): Instrument: Curette N/A N/A Bleeding: Minimum N/A N/A Hemostasis Achieved: Pressure N/A N/A Procedural Pain: 2 N/A N/A Post Procedural Pain: 2 N/A N/A Debridement Treatment Procedure was tolerated N/A  N/A Response: well Post Debridement 19x15x0.4 N/A N/A Measurements L x W x D (cm) Post Debridement 89.535 N/A N/A Volume: (cm) Periwound Skin Texture: Scarring: Yes Edema: No No Abnormalities Noted Edema: No Excoriation: No Excoriation: No Induration: No Induration: No Callus: No Callus: No Crepitus: No Crepitus: No Fluctuance: No Fluctuance: No Friable: No Friable: No Rash: No Rash: No Scarring: No Periwound Skin Moist: Yes Dry/Scaly: Yes Moist: Yes Moisture: Maceration: No Maceration: No Dry/Scaly: No Moist: No Periwound Skin Color: Atrophie Blanche: No Atrophie Blanche: No No Abnormalities Noted Cyanosis: No Cyanosis: No Ecchymosis: No Ecchymosis: No Erythema: No Erythema: No Hemosiderin Staining: No Hemosiderin Staining: No Mottled: No Mottled: No Neidhardt, Talitha H. ([id number]) Pallor: No Pallor: No Rubor: No Rubor: No Temperature: N/A N/A No Abnormality Tenderness on Yes No Yes Palpation: Wound Preparation: Ulcer Cleansing: Ulcer Cleansing: Other: Ulcer Cleansing: Rinsed/Irrigated with soap and water Rinsed/Irrigated with Saline Saline Topical Anesthetic Topical Anesthetic Applied: None Topical Anesthetic Applied: Other: lidocaine Applied: None 4% Procedures Performed: Debridement N/A N/A Wound Number: [id number]  Photos: No Photos No Photos No Photos Wound Location: Left Lower Leg - Lateral Right Lower Leg - Left Lower Leg - Posterior Posterior Wounding Event: Gradually Appeared Gradually Appeared Gradually Appeared Primary Etiology: Calciphylaxis Calciphylaxis Calciphylaxis Comorbid History: Hypertension, Type II Hypertension, Type II Hypertension, Type II Diabetes Diabetes Diabetes Date Acquired: 01/09/2015 03/27/2015 03/13/2015 Weeks of Treatment: 21 12 9  Wound Status: Open Open Open Measurements L x W x D 16x9x0.9 10x12x0.3 2x3x0.8 (cm) Area (cm) : 113.097 94.248 4.712 Volume (cm) : 101.788 28.274 3.77 % Reduction in Area: -928.50%  -5233.80% 70.00% % Reduction in Volume: -9153.50% -15874.00% -20.00% Classification: Full Thickness Without Full Thickness Without Full Thickness Without Exposed Support Exposed Support Exposed Support Structures Structures Structures HBO Classification: Grade 1 Grade 1 Grade 2 Exudate Amount: Large Large Large Exudate Type: Serous Serous Serous Exudate Color: amber amber amber Wound Margin: Flat and Intact Flat and Intact Thickened Granulation Amount: Medium (34-66%) Medium (34-66%) Small (1-33%) Granulation Quality: Red Red Pink Necrotic Amount: Medium (34-66%) Medium (34-66%) Large (67-100%) Necrotic Tissue: Adherent Slough Eschar, Adherent Slough Adherent Slough Exposed Structures: Fascia: No Fascia: No N/A Fat: No Fat: No Tendon: No Tendon: No Muscle: No Muscle: No Joint: No Joint: No Bone: No Bone: No Coats, Jacquelynn H. ([id number]) Limited to Skin Limited to Skin Breakdown Breakdown Epithelialization: None None None Debridement: N/A N/A N/A Time-Out Taken: N/A N/A N/A Pain Control: N/A N/A N/A Tissue Debrided: N/A N/A N/A Level: N/A N/A N/A Debridement Area (sq N/A N/A N/A cm): Instrument: N/A N/A N/A Bleeding: N/A N/A N/A Hemostasis Achieved: N/A N/A N/A Procedural Pain: N/A N/A N/A Post Procedural Pain: N/A N/A N/A Debridement Treatment N/A N/A N/A Response: Post Debridement N/A N/A N/A Measurements L x W x D (cm) Post Debridement N/A N/A N/A Volume: (cm) Periwound Skin Texture: Scarring: Yes Edema: No Scarring: Yes Edema: No Excoriation: No Excoriation: No Induration: No Induration: No Callus: No Callus: No Crepitus: No Crepitus: No  Fluctuance: No Fluctuance: No Friable: No Friable: No Rash: No Rash: No Scarring: No Periwound Skin Moist: Yes Moist: Yes Moist: Yes Moisture: Maceration: No Maceration: No Dry/Scaly: No Dry/Scaly: No Periwound Skin Color: Atrophie Blanche: No Atrophie Blanche: No No Abnormalities Noted Cyanosis:  No Cyanosis: No Ecchymosis: No Ecchymosis: No Erythema: No Erythema: No Hemosiderin Staining: No Hemosiderin Staining: No Mottled: No Mottled: No Pallor: No Pallor: No Rubor: No Rubor: No Temperature: N/A No Abnormality No Abnormality Tenderness on No Yes Yes Palpation: Wound Preparation: Ulcer Cleansing: Other: Ulcer Cleansing: Other: Ulcer Cleansing: soap and water soap and water Rinsed/Irrigated with Saline Topical Anesthetic Topical Anesthetic Topical Anesthetic Gosser, Endya H. ([id number]) Applied: Other: lidocaine Applied: Other: lidocaine Applied: Other: lidocaine 4% 4% 4% Procedures Performed: Biopsy N/A N/A Treatment Notes Electronic Signature(s) Signed: 06/28/2015 5:20:56 PM By: Alejandro Mulling Entered By: Alejandro Mulling on 06/28/2015 11:25:38 Sgro, [name] ([id number]) -------------------------------------------------------------------------------- Multi-Disciplinary Care Plan Details Patient Name: Bultema, Nohemy H. Date of Service: 06/28/2015 8:15 AM Medical Record Patient Account Number: [id number] Number: Treating RN: Phillis Haggis 08-01-1955 (59 y.o. Other Clinician: Date of Birth/Sex: Female) Treating BURNS III, Primary Care Physician: Fidel Levy Physician/Extender: Zollie Beckers Referring Physician: Jones Broom in Treatment: 21 Active Inactive Orientation to the Wound Care Program Nursing Diagnoses: Knowledge deficit related to the wound healing center program Goals: Patient/caregiver will verbalize understanding of the Wound Healing Center Program Date Initiated: 01/30/2015 Goal Status: Active Interventions: Provide education on orientation to the wound center Notes: Pain, Acute or Chronic Nursing Diagnoses: Pain, acute or chronic: actual or potential Goals: Patient will verbalize adequate pain control and receive pain control interventions during procedures as needed Date Initiated: 01/30/2015 Goal  Status: Active Patient/caregiver will verbalize adequate pain control between visits Date Initiated: 01/30/2015 Goal Status: Active Interventions: Assess comfort goal upon admission Encourage patient to take pain medications as prescribed Notes: Venous Leg Ulcer Hohensee, Eliot H. ([id number]) Nursing Diagnoses: Potential for venous Insuffiency (use before diagnosis confirmed) Goals: Non-invasive venous studies are completed as ordered Date Initiated: 01/30/2015 Goal Status: Active Interventions: Assess peripheral edema status every visit. Notes: Wound/Skin Impairment Nursing Diagnoses: Impaired tissue integrity Goals: Ulcer/skin breakdown will have a volume reduction of 30% by week 4 Date Initiated: 01/30/2015 Goal Status: Active Ulcer/skin breakdown will have a volume reduction of 50% by week 8 Date Initiated: 01/30/2015 Goal Status: Active Ulcer/skin breakdown will have a volume reduction of 80% by week 12 Date Initiated: 01/30/2015 Goal Status: Active Ulcer/skin breakdown will heal within 14 weeks Date Initiated: 01/30/2015 Goal Status: Active Interventions: Assess ulceration(s) every visit Notes: Electronic Signature(s) Signed: 06/28/2015 5:20:56 PM By: Alejandro Mulling Entered By: Alejandro Mulling on 06/28/2015 11:25:28 Derwin, Nicci H. ([id number]) -------------------------------------------------------------------------------- Pain Assessment Details Patient Name: Lingle, Gaylynn H. Date of Service: 06/28/2015 8:15 AM Medical Record Patient Account Number: [id number] Number: Treating RN: Phillis Haggis 09/26/1955 (59 y.o. Other Clinician: Date of Birth/Sex: Female) Treating BURNS III, Primary Care Physician: Fidel Levy Physician/Extender: Zollie Beckers Referring Physician: Jones Broom in Treatment: 21 Active Problems Location of Pain Severity and Description of Pain Patient Has Paino No Site Locations Pain Management and  Medication Current Pain Management: Electronic Signature(s) Signed: 06/28/2015 5:20:56 PM By: Alejandro Mulling Entered By: Alejandro Mulling on 06/28/2015 08:14:47 Wintle, Ardath Rexene Edison ([id number]) -------------------------------------------------------------------------------- Patient/Caregiver Education Details Patient Name: Grinage, Corabelle H. Date of Service: 06/28/2015 8:15 AM Medical Record Patient Account Number: [id number] Number: Treating RN: Phillis Haggis [birth year] (59 y.o. Other Clinician: Date of Birth/Gender: Female) Treating BURNS  III, Primary Care Physician: Fidel Levy Physician/Extender: Zollie Beckers Referring Physician: Jones Broom in Treatment: 21 Education Assessment Education Provided To: Patient Education Topics Provided Wound/Skin Impairment: Handouts: Other: change dressing as ordered Methods: Demonstration, Explain/Verbal Responses: State content correctly Electronic Signature(s) Signed: 06/28/2015 5:20:56 PM By: Alejandro Mulling Entered By: Alejandro Mulling on 06/28/2015 14:42:35 Hartman, Marykathleen H. ([id number]) -------------------------------------------------------------------------------- Wound Assessment Details Patient Name: Petree, Nicci H. Date of Service: 06/28/2015 8:15 AM Medical Record Patient Account Number: [id number] Number: Treating RN: Phillis Haggis 1955-10-25 (59 y.o. Other Clinician: Date of Birth/Sex: Female) Treating BURNS III, Primary Care Physician: Fidel Levy Physician/Extender: Zollie Beckers Referring Physician: Jones Broom in Treatment: 21 Wound Status Wound Number: 1 Primary Etiology: Calciphylaxis Wound Location: Right Lower Leg - Anterior Wound Status: Open Wounding Event: Gradually Appeared Comorbid History: Hypertension, Type II Diabetes Date Acquired: 01/09/2015 Weeks Of Treatment: 21 Clustered Wound: No Photos Photo Uploaded By: Alejandro Mulling on  06/28/2015 16:26:58 Wound Measurements Length: (cm) 19 Width: (cm) 15 Depth: (cm) 0.3 Area: (cm) 223.838 Volume: (cm) 67.152 % Reduction in Area: -476151.1% % Reduction in Volume: -1342940% Epithelialization: None Tunneling: No Undermining: No Wound Description Full Thickness Without Classification: Exposed Support Structures Diabetic Severity Grade 2 (Wagner): Wound Margin: Thickened Exudate Amount: Large Exudate Type: Serous Exudate Color: amber Foul Odor After Cleansing: No Wound Bed Dandridge, Audreena H. ([id number]) Granulation Amount: Small (1-33%) Exposed Structure Granulation Quality: Pink Fascia Exposed: No Necrotic Amount: Large (67-100%) Fat Layer Exposed: No Necrotic Quality: Eschar, Adherent Slough Tendon Exposed: No Muscle Exposed: No Joint Exposed: No Bone Exposed: No Limited to Skin Breakdown Periwound Skin Texture Texture Color No Abnormalities Noted: No No Abnormalities Noted: No Callus: No Atrophie Blanche: No Crepitus: No Cyanosis: No Excoriation: No Ecchymosis: No Fluctuance: No Erythema: No Friable: No Hemosiderin Staining: No Induration: No Mottled: No Localized Edema: No Pallor: No Rash: No Rubor: No Scarring: Yes Temperature / Pain Moisture Tenderness on Palpation: Yes No Abnormalities Noted: No Dry / Scaly: No Maceration: No Moist: Yes Wound Preparation Ulcer Cleansing: Rinsed/Irrigated with Saline Topical Anesthetic Applied: Other: lidocaine 4%, Treatment Notes Wound #1 (Right, Anterior Lower Leg) 1. Cleansed with: Clean wound with Normal Saline 2. Anesthetic Topical Lidocaine 4% cream to wound bed prior to debridement 4. Dressing Applied: Aquacel Ag 5. Secondary Dressing Applied ABD Pad 7. Secured with 3 Layer Compression System - Bilateral Electronic Signature(s) Signed: 06/28/2015 5:20:56 PM By: Almedia Balls, Drinda Rexene Edison ([id number]) Entered By: Alejandro Mulling on 06/28/2015 08:55:38 Bess,  Lorriane Rexene Edison ([id number]) -------------------------------------------------------------------------------- Wound Assessment Details Patient Name: Docter, Palma H. Date of Service: 06/28/2015 8:15 AM Medical Record Patient Account Number: [id number] Number: Treating RN: Phillis Haggis 08-15-1955 (59 y.o. Other Clinician: Date of Birth/Sex: Female) Treating BURNS III, Primary Care Physician: Fidel Levy Physician/Extender: Zollie Beckers Referring Physician: Jones Broom in Treatment: 21 Wound Status Wound Number: 10 Primary Etiology: Pressure Ulcer Wound Location: Left Gluteus Wound Status: Open Wounding Event: Gradually Appeared Comorbid History: Hypertension, Type II Diabetes Date Acquired: 05/31/2015 Weeks Of Treatment: 3 Clustered Wound: No Photos Photo Uploaded By: Alejandro Mulling on 06/28/2015 16:27:26 Wound Measurements Length: (cm) 4 Width: (cm) 2 Depth: (cm) 0.1 Area: (cm) 6.283 Volume: (cm) 0.628 % Reduction in Area: 33.3% % Reduction in Volume: 33.3% Epithelialization: None Tunneling: No Undermining: No Wound Description Classification: Category/Stage II Wound Margin: Thickened Exudate Amount: Medium Exudate Type: Serosanguineous Exudate Color: red, brown Foul Odor After Cleansing: No Wound Bed Granulation Amount: Large (67-100%) Exposed Structure Granulation Quality:  Red Fascia Exposed: No Necrotic Amount: Small (1-33%) Fat Layer Exposed: No Campoverde, Cristal H. ([id number]) Necrotic Quality: Eschar Tendon Exposed: No Muscle Exposed: No Joint Exposed: No Bone Exposed: No Limited to Skin Breakdown Periwound Skin Texture Texture Color No Abnormalities Noted: No No Abnormalities Noted: No Callus: No Atrophie Blanche: No Crepitus: No Cyanosis: No Excoriation: No Ecchymosis: No Fluctuance: No Erythema: No Friable: No Hemosiderin Staining: No Induration: No Mottled: No Localized Edema: No Pallor: No Rash:  No Rubor: No Scarring: No Moisture No Abnormalities Noted: No Dry / Scaly: Yes Maceration: No Moist: No Wound Preparation Ulcer Cleansing: Other: soap and water, Topical Anesthetic Applied: None Treatment Notes Wound #10 (Left Gluteus) 1. Cleansed with: Clean wound with Normal Saline 2. Anesthetic Topical Lidocaine 4% cream to wound bed prior to debridement 4. Dressing Applied: Aquacel Ag 5. Secondary Dressing Applied ABD Pad 7. Secured with 3 Layer Compression System - Bilateral Electronic Signature(s) Signed: 06/28/2015 5:20:56 PM By: Alejandro Mulling Entered By: Alejandro Mulling on 06/28/2015 09:02:18 Testerman, Finnleigh Rexene Edison ([id number]) -------------------------------------------------------------------------------- Wound Assessment Details Patient Name: Bankhead, Rosaria H. Date of Service: 06/28/2015 8:15 AM Medical Record Patient Account Number: [id number] Number: Treating RN: Phillis Haggis April 23, 1956 (59 y.o. Other Clinician: Date of Birth/Sex: Female) Treating BURNS III, Primary Care Physician: Fidel Levy Physician/Extender: Zollie Beckers Referring Physician: Jones Broom in Treatment: 21 Wound Status Wound Number: 11 Primary Etiology: Pressure Ulcer Wound Location: Right Gluteus Wound Status: Open Wounding Event: Gradually Appeared Comorbid History: Hypertension, Type II Diabetes Date Acquired: 05/31/2015 Weeks Of Treatment: 3 Clustered Wound: No Photos Photo Uploaded By: Alejandro Mulling on 06/28/2015 16:27:40 Wound Measurements Length: (cm) 0.2 Width: (cm) 0.3 Depth: (cm) 0.1 Area: (cm) 0.047 Volume: (cm) 0.005 % Reduction in Area: 94% % Reduction in Volume: 96.8% Epithelialization: None Tunneling: No Undermining: No Wound Description Classification: Category/Stage II Wound Margin: Flat and Intact Exudate Amount: Medium Exudate Type: Serosanguineous Exudate Color: red, brown Foul Odor After Cleansing: No Wound  Bed Granulation Amount: Large (67-100%) Granulation Quality: Red Necrotic Amount: None Present (0%) Menees, Hildagarde H. ([id number]) Periwound Skin Texture Texture Color No Abnormalities Noted: No No Abnormalities Noted: No Moisture Temperature / Pain No Abnormalities Noted: No Temperature: No Abnormality Moist: Yes Tenderness on Palpation: Yes Wound Preparation Ulcer Cleansing: Rinsed/Irrigated with Saline Topical Anesthetic Applied: None Treatment Notes Wound #11 (Right Gluteus) 1. Cleansed with: Clean wound with Normal Saline 2. Anesthetic Topical Lidocaine 4% cream to wound bed prior to debridement 4. Dressing Applied: Aquacel Ag 5. Secondary Dressing Applied ABD Pad 7. Secured with 3 Layer Compression System - Bilateral Electronic Signature(s) Signed: 06/28/2015 5:20:56 PM By: Alejandro Mulling Entered By: Alejandro Mulling on 06/28/2015 09:01:24 Gallaway, Katheline Rexene Edison ([id number]) -------------------------------------------------------------------------------- Wound Assessment Details Patient Name: Soucek, Donnamaria H. Date of Service: 06/28/2015 8:15 AM Medical Record Patient Account Number: [id number] Number: Treating RN: Phillis Haggis 1955-10-16 (59 y.o. Other Clinician: Date of Birth/Sex: Female) Treating BURNS III, Primary Care Physician: Fidel Levy Physician/Extender: Zollie Beckers Referring Physician: Jones Broom in Treatment: 21 Wound Status Wound Number: 3 Primary Etiology: Calciphylaxis Wound Location: Left Lower Leg - Lateral Wound Status: Open Wounding Event: Gradually Appeared Comorbid History: Hypertension, Type II Diabetes Date Acquired: 01/09/2015 Weeks Of Treatment: 21 Clustered Wound: No Photos Photo Uploaded By: Alejandro Mulling on 06/28/2015 16:27:57 Wound Measurements Length: (cm) 16 Width: (cm) 9 Depth: (cm) 0.9 Area: (cm) 113.097 Volume: (cm) 101.788 % Reduction in Area: -928.5% % Reduction in Volume:  -9153.5% Epithelialization: None Tunneling: No Undermining: No Wound  Description Full Thickness Without Exposed Classification: Support Structures Diabetic Severity Grade 1 (Wagner): Wound Margin: Flat and Intact Exudate Amount: Large Exudate Type: Serous Exudate Color: amber Wound Bed Gipson, Louan H. ([id number]) Granulation Amount: Medium (34-66%) Exposed Structure Granulation Quality: Red Fascia Exposed: No Necrotic Amount: Medium (34-66%) Fat Layer Exposed: No Necrotic Quality: Adherent Slough Tendon Exposed: No Muscle Exposed: No Joint Exposed: No Bone Exposed: No Limited to Skin Breakdown Periwound Skin Texture Texture Color No Abnormalities Noted: No No Abnormalities Noted: No Callus: No Atrophie Blanche: No Crepitus: No Cyanosis: No Excoriation: No Ecchymosis: No Fluctuance: No Erythema: No Friable: No Hemosiderin Staining: No Induration: No Mottled: No Localized Edema: No Pallor: No Rash: No Rubor: No Scarring: Yes Moisture No Abnormalities Noted: No Dry / Scaly: No Maceration: No Moist: Yes Wound Preparation Ulcer Cleansing: Other: soap and water, Topical Anesthetic Applied: Other: lidocaine 4%, Treatment Notes Wound #3 (Left, Lateral Lower Leg) 1. Cleansed with: Clean wound with Normal Saline 2. Anesthetic Topical Lidocaine 4% cream to wound bed prior to debridement 4. Dressing Applied: Aquacel Ag 5. Secondary Dressing Applied ABD Pad 7. Secured with 3 Layer Compression System - Bilateral Electronic Signature(s) Signed: 06/28/2015 5:20:56 PM By: Almedia BallsPinkerton, Debra Santagata, Allena Rexene EdisonH. ([id number]) Entered By: Alejandro MullingPinkerton, Debra on 06/28/2015 08:56:06 Coen, Elisha Rexene EdisonH. ([id number]) -------------------------------------------------------------------------------- Wound Assessment Details Patient Name: Sustaita, Camera H. Date of Service: 06/28/2015 8:15 AM Medical Record Patient Account Number:  [id number] Number: Treating RN: Phillis Haggisinkerton, Debi 1956-05-18 (59 y.o. Other Clinician: Date of Birth/Sex: Female) Treating BURNS III, Primary Care Physician: Fidel LevyHAWKINS JR, JAMES Physician/Extender: Zollie BeckersWALTER Referring Physician: Jones BroomHAWKINS JR, JAMES Weeks in Treatment: 21 Wound Status Wound Number: 8 Primary Etiology: Calciphylaxis Wound Location: Right Lower Leg - Posterior Wound Status: Open Wounding Event: Gradually Appeared Comorbid History: Hypertension, Type II Diabetes Date Acquired: 03/27/2015 Weeks Of Treatment: 12 Clustered Wound: No Photos Photo Uploaded By: Alejandro MullingPinkerton, Debra on 06/28/2015 16:28:23 Wound Measurements Length: (cm) 10 Width: (cm) 12 Depth: (cm) 0.3 Area: (cm) 94.248 Volume: (cm) 28.274 % Reduction in Area: -5233.8% % Reduction in Volume: -15874% Epithelialization: None Tunneling: No Undermining: No Wound Description Full Thickness Without Classification: Exposed Support Structures Diabetic Severity Grade 1 (Wagner): Wound Margin: Flat and Intact Exudate Amount: Large Exudate Type: Serous Exudate Color: amber Foul Odor After Cleansing: No Wound Bed Mishkin, Antanette H. ([id number]) Granulation Amount: Medium (34-66%) Exposed Structure Granulation Quality: Red Fascia Exposed: No Necrotic Amount: Medium (34-66%) Fat Layer Exposed: No Necrotic Quality: Eschar, Adherent Slough Tendon Exposed: No Muscle Exposed: No Joint Exposed: No Bone Exposed: No Limited to Skin Breakdown Periwound Skin Texture Texture Color No Abnormalities Noted: No No Abnormalities Noted: No Callus: No Atrophie Blanche: No Crepitus: No Cyanosis: No Excoriation: No Ecchymosis: No Fluctuance: No Erythema: No Friable: No Hemosiderin Staining: No Induration: No Mottled: No Localized Edema: No Pallor: No Rash: No Rubor: No Scarring: No Temperature / Pain Moisture Temperature: No Abnormality No Abnormalities Noted: No Tenderness on Palpation:  Yes Dry / Scaly: No Maceration: No Moist: Yes Wound Preparation Ulcer Cleansing: Other: soap and water, Topical Anesthetic Applied: Other: lidocaine 4%, Treatment Notes Wound #8 (Right, Posterior Lower Leg) 1. Cleansed with: Clean wound with Normal Saline 2. Anesthetic Topical Lidocaine 4% cream to wound bed prior to debridement 4. Dressing Applied: Aquacel Ag 5. Secondary Dressing Applied ABD Pad 7. Secured with 3 Layer Compression System - Bilateral Electronic Signature(s) Signed: 06/28/2015 5:20:56 PM By: Almedia BallsPinkerton, Debra Kreiser, Bobie Rexene EdisonH. ([id number]) Entered By: Alejandro MullingPinkerton, Debra on 06/28/2015 08:56:30 Tumolo, Rainy H. ([id number]) --------------------------------------------------------------------------------  Wound Assessment Details Patient Name: Chipps, Rosland H. Date of Service: 06/28/2015 8:15 AM Medical Record Patient Account Number: [id number] Number: Treating RN: Phillis Haggis 02/06/1956 (59 y.o. Other Clinician: Date of Birth/Sex: Female) Treating BURNS III, Primary Care Physician: Fidel Levy Physician/Extender: Zollie Beckers Referring Physician: Jones Broom in Treatment: 21 Wound Status Wound Number: 9 Primary Etiology: Calciphylaxis Wound Location: Left Lower Leg - Posterior Wound Status: Open Wounding Event: Gradually Appeared Comorbid History: Hypertension, Type II Diabetes Date Acquired: 03/13/2015 Weeks Of Treatment: 9 Clustered Wound: No Photos Photo Uploaded By: Alejandro Mulling on 06/28/2015 16:28:37 Wound Measurements Length: (cm) 2 % Reduction in Width: (cm) 3 % Reduction in Depth: (cm) 0.8 Epithelializati Area: (cm) 4.712 Tunneling: Volume: (cm) 3.77 Undermining: Area: 70% Volume: -20% on: None No No Wound Description Full Thickness Without Classification: Exposed Support Structures Diabetic Severity Grade 2 (Wagner): Wound Margin: Thickened Exudate Amount: Large Exudate Type:  Serous Exudate Color: amber Foul Odor After Cleansing: No Wound Bed Dabbs, Pinki H. ([id number]) Granulation Amount: Small (1-33%) Granulation Quality: Pink Necrotic Amount: Large (67-100%) Necrotic Quality: Adherent Slough Periwound Skin Texture Texture Color No Abnormalities Noted: No No Abnormalities Noted: No Scarring: Yes Temperature / Pain Moisture Temperature: No Abnormality No Abnormalities Noted: No Tenderness on Palpation: Yes Moist: Yes Wound Preparation Ulcer Cleansing: Rinsed/Irrigated with Saline Topical Anesthetic Applied: Other: lidocaine 4%, Treatment Notes Wound #9 (Left, Posterior Lower Leg) 1. Cleansed with: Clean wound with Normal Saline 2. Anesthetic Topical Lidocaine 4% cream to wound bed prior to debridement 4. Dressing Applied: Aquacel Ag 5. Secondary Dressing Applied ABD Pad 7. Secured with 3 Layer Compression System - Bilateral Electronic Signature(s) Signed: 06/28/2015 5:20:56 PM By: Alejandro Mulling Entered By: Alejandro Mulling on 06/28/2015 08:57:24 Eschbach, Honest Rexene Edison ([id number]) -------------------------------------------------------------------------------- Vitals Details Patient Name: Cech, Jenesa H. Date of Service: 06/28/2015 8:15 AM Medical Record Patient Account Number: [id number] Number: Treating RN: Phillis Haggis 01-May-1956 (59 y.o. Other Clinician: Date of Birth/Sex: Female) Treating BURNS III, Primary Care Physician: Fidel Levy Physician/Extender: Zollie Beckers Referring Physician: Jones Broom in Treatment: 21 Vital Signs Time Taken: 08:15 Temperature (F): 97.7 Height (in): 68 Pulse (bpm): 100 Weight (lbs): 294 Respiratory Rate (breaths/min): 18 Body Mass Index (BMI): 44.7 Blood Pressure (mmHg): 135/76 Reference Range: 80 - 120 mg / dl Electronic Signature(s) Signed: 06/28/2015 5:20:56 PM By: Alejandro Mulling Entered By: Alejandro Mulling on 06/28/2015 08:16:31

## 2015-06-29 NOTE — Progress Notes (Signed)
GENEVRA, ORNE ([id number]) Visit Report for 06/28/2015 Biopsy Details Patient Name: [name], [name]. Date of Service: 06/28/2015 8:15 AM Medical Record Patient Account Number: [id number] Number: Treating RN: Ahmed Prima [birth year] (59 y.o. Other Clinician: Date of Birth/Sex: Female) Treating BURNS III, Primary Care Physician/Extender: Hurman Horn Physician: Referring Physician: Dana Allan in Treatment: 21 Biopsy Performed for: Wound #3 Left, Lateral Lower Leg Location(s): Wound Margin Performed By: Physician BURNS III, Teressa Senter., MD Tissue Punch: Yes Size (mm): 3 Number of Specimens Taken: 1 Specimen Sent To Pathology: Yes Time-Out Taken: Yes Pain Control: Lidocaine Injectable Lidocaine Percent: 2% Instrument: Other: 61m punch biopsy kit Bleeding: Minimum Hemostasis Achieved: Silver Nitrate Procedural Pain: 0 Post Procedural Pain: 0 Response to Treatment: Procedure was tolerated well Post Procedure Diagnosis Same as Pre-procedure Notes Punch biopsy taken at superior margin (12:00) of the most superior, lateral left calf ulceration (which she identified as her most chronic ulceration). Electronic Signature(s) Signed: 06/28/2015 3:49:04 PM By: BLoletha GrayerMD Entered By: BLoletha Grayeron 06/28/2015 10:33:53 Gerard, PAletha Halim([id number] -------------------------------------------------------------------------------- Chief Complaint Document Details Patient Name: [name], [name] Date of Service: 06/28/2015 8:15 AM Medical Record Patient Account Number: [id number]: Treating RN: PAhmed Prima119[birth year](59 y.o. Other Clinician: Date of Birth/Sex: Female) Treating BURNS III, Primary Care Physician/Extender: WHurman HornPhysician: Referring Physician: HDana Allanin Treatment: 21 Information Obtained from: Patient Chief Complaint Chronic bilateral calf  ulcers. Electronic Signature(s) Signed: 06/28/2015 3:49:04 PM By: BLoletha GrayerMD Entered By: BLoletha Grayeron 06/28/2015 10:29:33 Prins, PAletha Halim([id number] -------------------------------------------------------------------------------- Debridement Details Patient Name: [name], [name] Date of Service: 06/28/2015 8:15 AM Medical Record Patient Account Number: [id number]: Treating RN: PAhmed Prima11955-03-[birth year](59 y.o. Other Clinician: Date of Birth/Sex: Female) Treating BURNS III, Primary Care Physician/Extender: WHurman HornPhysician: Referring Physician: HDana Allanin Treatment: 21 Debridement Performed for Wound #1 Right,Anterior Lower Leg Assessment: Performed By: Physician BURNS III, WTeressa Senter, MD Debridement: Debridement Pre-procedure Yes Verification/Time Out Taken: Start Time: 09:00 Pain Control: Lidocaine 4% Topical Solution Level: Skin/Subcutaneous Tissue Total Area Debrided (L x 10 (cm) x 5 (cm) = 50 (cm) W): Tissue and other Viable, Non-Viable, Fat, Fibrin/Slough, Subcutaneous material debrided: Instrument: Curette Bleeding: Minimum Hemostasis Achieved: Pressure End Time: 09:02 Procedural Pain: 2 Post Procedural Pain: 2 Response to Treatment: Procedure was tolerated well Post Debridement Measurements of Total Wound Length: (cm) 19 Width: (cm) 15 Depth: (cm) 0.4 Volume: (cm) 89.535 Post Procedure Diagnosis Same as Pre-procedure Electronic Signature(s) Signed: 06/28/2015 3:49:04 PM By: BLoletha GrayerMD Signed: 06/28/2015 5:20:56 PM By: PAlric QuanEntered By: BLoletha Grayeron 06/28/2015 10:29:10 Hazzard, PAletha Halim([id number] MEmmet PWhitehall([id number] -------------------------------------------------------------------------------- HPI Details Patient Name: [name], [name] Date of Service: 06/28/2015 8:15 AM Medical Record Patient Account Number:  [id number]: Treating RN: PAhmed Prima108-23-1957(59 y.o. Other Clinician: Date of Birth/Sex: Female) Treating BURNS III, Primary Care Physician/Extender: WHurman HornPhysician: Referring Physician: HDana Allanin Treatment: 21 History of Present Illness HPI Description: 59year old with h/o DM (Hgb A1c 7.4 in Aug 2016), ESRD (on hemodialysis since July 2016). Presented to her PCP Dr. JLarene Beachfor BLE ulcers since June 2016. She was taken to the OR on 03/13/2015 for a angioplasty of her left arm AV fistula. No lower extremity debridement or biopsies. 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 No record of venous US to assess for venous insufficiency. Patient has declined [place]. Culture 04/19/2015 grew methicillin sensitive staph aureus (sensitive to tetracycline), Stenotrophomonas maltophilia, and Enterococcus faecalis. Completed doxycycline. Recently hospitalized for worsening cellulitis. Treated with IV antibiotics. No operative debridement or biopsy. Performing dressing changes with silver alginate. Tolerating Profore light bilaterally. She returns to clinic and reports that her legs feel better after course of antibiotics. No fever or chills. Less drainage. Electronic Signature(s) Signed: 06/28/2015 3:49:04 PM By: Loletha Grayer MD Entered By: Loletha Grayer on 06/28/2015 10:30:46 Tarrant, Aletha Halim ([id number]) -------------------------------------------------------------------------------- Physical Exam Details Patient Name: [name], [name] Date of Service: 06/28/2015 8:15 AM Medical Record Patient Account Number: [id number] Number: Treating RN: Ahmed Prima 1955/08/03 (59 y.o. Other Clinician: Date of Birth/Sex: Female) Treating BURNS III, Primary Care Physician/Extender:  Hurman Horn Physician: Referring Physician: Dicky Doe Weeks in Treatment: 21 Constitutional . Pulse regular. Respirations normal and unlabored. Afebrile. . Notes Bilateral calf ulcerations improved since hospitalization. No significant cellulitis. Less necrotic tissue. Persistent 2+ pitting edema. Faintly palpable DP bilaterally. Triphasic on the right. Biphasic on the left. Noncompressible. Electronic Signature(s) Signed: 06/28/2015 3:49:04 PM By: Loletha Grayer MD Entered By: Loletha Grayer on 06/28/2015 10:31:32 Bunten, Aletha Halim ([id number]) -------------------------------------------------------------------------------- Physician Orders Details Patient Name: Thorns, Leandria H. Date of Service: 06/28/2015 8:15 AM Medical Record Patient Account Number: [id number] Number: Treating RN: Ahmed Prima 1956/02/03 (59 y.o. Other Clinician: Date of Birth/Sex: Female) Treating BURNS III, Primary Care Physician/Extender: Hurman Horn Physician: Referring Physician: Dana Allan in Treatment: 21 Verbal / Phone Orders: Yes Clinician: Carolyne Fiscal, Debi Read Back and Verified: Yes Diagnosis Coding Wound Cleansing Wound #1 Right,Anterior Lower Leg o Clean wound with Normal Saline. o Cleanse wound with mild soap and water o May Shower, gently pat wound dry prior to applying new dressing. Wound #10 Left Gluteus o Clean wound with Normal Saline. o Cleanse wound with mild soap and water o May Shower, gently pat wound dry prior to applying new dressing. Wound #11 Right Gluteus o Clean wound with Normal Saline. o Cleanse wound with mild soap and water o May Shower, gently pat wound dry prior to applying new dressing. Wound #3 Left,Lateral Lower Leg o Clean wound with Normal Saline. o Cleanse wound with mild soap and water o May Shower, gently pat wound dry prior to applying new dressing. Wound #8  Right,Posterior Lower Leg o Clean wound with Normal Saline. o Cleanse wound with mild soap and water o May Shower, gently pat wound dry prior to applying new dressing. Wound #9 Left,Posterior Lower Leg o Clean wound with Normal Saline. o Cleanse wound with mild soap and water o May Shower, gently pat wound dry prior to applying new dressing. Anesthetic Wound #1 Right,Anterior Lower Leg o Topical Lidocaine 4% cream applied to wound bed prior to debridement Somma, Dimples H. ([id number]) Wound #3 Left,Lateral Lower Leg o Topical Lidocaine 4% cream applied to wound bed prior to debridement Wound #8 Right,Posterior Lower Leg o Topical Lidocaine 4% cream applied to wound bed prior to debridement Wound #9 Left,Posterior Lower Leg o Topical Lidocaine 4% cream applied to wound bed prior to debridement Skin Barriers/Peri-Wound Care Wound #10 Left Gluteus o Barrier cream Wound #11 Right Gluteus o Barrier cream Primary Wound Dressing Wound #1 Right,Anterior Lower Leg o Aquacel Ag - Or Equivalent Wound #3 Left,Lateral Lower Leg o Aquacel Ag -  Or Equivalent Wound #8 Right,Posterior Lower Leg o Aquacel Ag - Or Equivalent Wound #9 Left,Posterior Lower Leg o Aquacel Ag - Or Equivalent Secondary Dressing Wound #1 Right,Anterior Lower Leg o ABD pad - please add ABD pad to dressing Wound #3 Left,Lateral Lower Leg o ABD pad - please add ABD pad to dressing Wound #8 Right,Posterior Lower Leg o ABD pad - please add ABD pad to dressing Wound #9 Left,Posterior Lower Leg o ABD pad - please add ABD pad to dressing Dressing Change Frequency Wound #1 Right,Anterior Lower Leg o Change Dressing Monday, Wednesday, Friday - Wednesday in wound care center Wound #3 Left,Lateral Lower Leg [name], [name] ([id number]) o Change Dressing Monday, Wednesday, Friday - Wednesday in wound care center Wound #8 Right,Posterior Lower Leg o Change Dressing Monday,  Wednesday, Friday - Wednesday in wound care center Wound #9 Left,Posterior Lower Leg o Change Dressing Monday, Wednesday, Friday - Wednesday in wound care center Follow-up Appointments Wound #1 Right,Anterior Lower Leg o Return Appointment in 1 week. Wound #10 Left Gluteus o Return Appointment in 1 week. Wound #11 Right Gluteus o Return Appointment in 1 week. Wound #3 Left,Lateral Lower Leg o Return Appointment in 1 week. Wound #8 Right,Posterior Lower Leg o Return Appointment in 1 week. Wound #9 Left,Posterior Lower Leg o Return Appointment in 1 week. Edema Control Wound #1 Right,Anterior Lower Leg o 3 Layer Compression System - Bilateral - Profore lite o Elevate legs to the level of the heart and pump ankles as often as possible - Elevate legs at Dialysis Wound #3 Left,Lateral Lower Leg o 3 Layer Compression System - Bilateral - Profore lite o Elevate legs to the level of the heart and pump ankles as often as possible - Elevate legs at Dialysis Wound #8 Right,Posterior Lower Leg o 3 Layer Compression System - Bilateral - Profore lite o Elevate legs to the level of the heart and pump ankles as often as possible - Elevate legs at Dialysis Wound #9 Left,Posterior Lower Leg o 3 Layer Compression System - Bilateral - Profore lite o Elevate legs to the level of the heart and pump ankles as often as possible - Elevate legs at Dialysis [name], [name] ([id number]) Off-Loading Wound #10 Left Gluteus o Turn and reposition every 2 hours Wound #11 Right Gluteus o Turn and reposition every 2 hours Home Health Wound #1 Right,Anterior Lower Leg o [place] Visits - Advanced: Monday, Wednesday and Friday **WRAP GOES FROM BASE OF TOES TO THREE FINGER WIDTHS BENEATH BEND IN KNEE. o Home Health Nurse may visit PRN to address patientos wound care needs. o FACE TO FACE ENCOUNTER: MEDICARE and MEDICAID PATIENTS: I certify that this patient  is under my care and that I had a face-to-face encounter that meets the physician face-to-face encounter requirements with this patient on this date. The encounter with the patient was in whole or in part for the following MEDICAL CONDITION: (primary reason for [place]) MEDICAL NECESSITY: I certify, that based on my findings, NURSING services are a medically necessary home health service. HOME BOUND STATUS: I certify that my clinical findings support that this patient is homebound (i.e., Due to illness or injury, pt requires aid of supportive devices such as crutches, cane, wheelchairs, walkers, the use of special transportation or the assistance of another person to leave their place of residence. There is a normal inability to leave the home and doing so requires considerable and taxing effort. Other absences are for medical reasons / religious services  and are infrequent or of short duration when for other reasons). o If current dressing causes regression in wound condition, may D/C ordered dressing product/s and apply Normal Saline Moist Dressing daily until next [place] / Other MD appointment. [place] of regression in wound condition at [phone]. o Please direct any NON-WOUND related issues/requests for orders to patient's Primary Care Physician Wound #3 Left,Lateral Lower Leg o [place] Visits - Advanced: Monday, Wednesday and Friday **WRAP GOES FROM BASE OF TOES TO THREE FINGER WIDTHS BENEATH BEND IN KNEE. o Home Health Nurse may visit PRN to address patientos wound care needs. o FACE TO FACE ENCOUNTER: MEDICARE and MEDICAID PATIENTS: I certify that this patient is under my care and that I had a face-to-face encounter that meets the physician face-to-face encounter requirements with this patient on this date. The encounter with the patient was in whole or in part for the following MEDICAL CONDITION: (primary reason for  [place]) MEDICAL NECESSITY: I certify, that based on my findings, NURSING services are a medically necessary home health service. HOME BOUND STATUS: I certify that my clinical findings support that this patient is homebound (i.e., Due to illness or injury, pt requires aid of supportive devices such as crutches, cane, wheelchairs, walkers, the use of special transportation or the assistance of another person to leave their place of residence. There is a normal inability to leave the home and doing so requires considerable and taxing effort. Other absences are for medical reasons / religious services and are infrequent or of short duration when for other reasons). o If current dressing causes regression in wound condition, may D/C ordered dressing product/s and apply Normal Saline Moist Dressing daily until next [place] / Other MD appointment. [place] of regression in wound condition at [phone]. ROBERTINE, KIPPER ([id number]) o Please direct any NON-WOUND related issues/requests for orders to patient's Primary Care Physician Wound #8 [place] Visits - Advanced: Monday, Wednesday and Friday **WRAP GOES FROM BASE OF TOES TO THREE FINGER WIDTHS BENEATH BEND IN KNEE. o Home Health Nurse may visit PRN to address patientos wound care needs. o FACE TO FACE ENCOUNTER: MEDICARE and MEDICAID PATIENTS: I certify that this patient is under my care and that I had a face-to-face encounter that meets the physician face-to-face encounter requirements with this patient on this date. The encounter with the patient was in whole or in part for the following MEDICAL CONDITION: (primary reason for [place]) MEDICAL NECESSITY: I certify, that based on my findings, NURSING services are a medically necessary home health service. HOME BOUND STATUS: I certify that my clinical findings support that this patient is  homebound (i.e., Due to illness or injury, pt requires aid of supportive devices such as crutches, cane, wheelchairs, walkers, the use of special transportation or the assistance of another person to leave their place of residence. There is a normal inability to leave the home and doing so requires considerable and taxing effort. Other absences are for medical reasons / religious services and are infrequent or of short duration when for other reasons). o If current dressing causes regression in wound condition, may D/C ordered dressing product/s and apply Normal Saline Moist Dressing daily until next [place] / Other MD appointment. [place] of regression in wound condition at [phone]. o Please direct any NON-WOUND related issues/requests for orders to patient's Primary Care Physician Wound #9 Left,Posterior Lower  Leg o Continue Home Health Visits - Advanced: Monday, Wednesday and Friday **WRAP GOES FROM BASE OF TOES TO THREE FINGER WIDTHS BENEATH BEND IN KNEE. o Home Health Nurse may visit PRN to address patientos wound care needs. o FACE TO FACE ENCOUNTER: MEDICARE and MEDICAID PATIENTS: I certify that this patient is under my care and that I had a face-to-face encounter that meets the physician face-to-face encounter requirements with this patient on this date. The encounter with the patient was in whole or in part for the following MEDICAL CONDITION: (primary reason for [place]) MEDICAL NECESSITY: I certify, that based on my findings, NURSING services are a medically necessary home health service. HOME BOUND STATUS: I certify that my clinical findings support that this patient is homebound (i.e., Due to illness or injury, pt requires aid of supportive devices such as crutches, cane, wheelchairs, walkers, the use of special transportation or the assistance of another person to leave their place of residence. There is a normal inability  to leave the home and doing so requires considerable and taxing effort. Other absences are for medical reasons / religious services and are infrequent or of short duration when for other reasons). o If current dressing causes regression in wound condition, may D/C ordered dressing product/s and apply Normal Saline Moist Dressing daily until next [place] / Other MD appointment. [place] of regression in wound condition at [phone]. o Please direct any NON-WOUND related issues/requests for orders to patient's Primary Care Physician Electronic Signature(s) Signed: 06/28/2015 3:49:04 PM By: Loletha Grayer MD Deblasi, Stephany HMarland Kitchen ([id number]) Signed: 06/28/2015 5:20:56 PM By: Alric Quan Entered By: Alric Quan on 06/28/2015 09:45:32 Milton, Keishawna Lemmie Evens ([id number]) -------------------------------------------------------------------------------- Problem List Details Patient Name: [name], [name] Date of Service: 06/28/2015 8:15 AM Medical Record Patient Account Number: [id number] Number: Treating RN: Ahmed Prima 07-15-1956 (59 y.o. Other Clinician: Date of Birth/Sex: Female) Treating BURNS III, Primary Care Physician/Extender: Hurman Horn Physician: Referring Physician: Dana Allan in Treatment: 21 Active Problems ICD-10 Encounter Code Description Active Date Diagnosis E11.622 Type 2 diabetes mellitus with other skin ulcer 01/30/2015 Yes N18.6 End stage renal disease 01/30/2015 Yes L97.222 Non-pressure chronic ulcer of left calf with fat layer 01/30/2015 Yes exposed L97.212 Non-pressure chronic ulcer of right calf with fat layer 01/30/2015 Yes exposed R60.0 Localized edema 04/19/2015 Yes Inactive Problems Resolved Problems ICD-10 Code Description Active Date Resolved Date L03.115 Cellulitis of right lower limb 05/31/2015 05/31/2015 L03.116 Cellulitis of left lower limb 05/31/2015  05/31/2015 L89.152 Pressure ulcer of sacral region, stage 2 06/07/2015 06/07/2015 ARYONA, SILL ([id number]) Electronic Signature(s) Signed: 06/28/2015 3:49:04 PM By: Loletha Grayer MD Entered By: Loletha Grayer on 06/28/2015 10:27:34 Quirk, Aletha Halim ([id number]) -------------------------------------------------------------------------------- Progress Note Details Patient Name: [name], [name] Date of Service: 06/28/2015 8:15 AM Medical Record Patient Account Number: [id number] Number: Treating RN: Ahmed Prima 08-02-1955 (59 y.o. Other Clinician: Date of Birth/Sex: Female) Treating BURNS III, Primary Care Physician/Extender: Hurman Horn Physician: Referring Physician: Dana Allan in Treatment: 21 Subjective Chief Complaint Information obtained from Patient Chronic bilateral calf ulcers. History of Present Illness (HPI) 59 year old with h/o DM (Hgb A1c 7.4 in Aug 2016), ESRD (on hemodialysis since July 2016). Presented to her PCP Dr. Larene Beach for BLE ulcers since June 2016. She was taken to the OR on 03/13/2015 for a angioplasty of her left arm AV fistula. No lower extremity debridement or biopsies. Arterial ultrasound in August 2016  showed no significant peripheral arterial disease on the right and mild tibioperoneal atherosclerotic disease on the left. No record of venous US to assess for venous insufficiency. Patient has declined [place]. Culture 04/19/2015 grew methicillin sensitive staph aureus (sensitive to tetracycline), Stenotrophomonas maltophilia, and Enterococcus faecalis. Completed doxycycline. Recently hospitalized for worsening cellulitis. Treated with IV antibiotics. No operative debridement or biopsy. Performing dressing changes with silver alginate. Tolerating Profore light bilaterally. She returns to clinic and reports that her legs feel better after course of antibiotics. No fever or chills.  Less drainage. Objective Constitutional Pulse regular. Respirations normal and unlabored. Afebrile. Vitals Time Taken: 8:15 AM, Height: 68 in, Weight: 294 lbs, BMI: 44.7, Temperature: 97.7 F, Pulse: 100 bpm, Respiratory Rate: 18 breaths/min, Blood Pressure: 135/76 mmHg. [name], [name] HMarland Kitchen ([id number]) General Notes: Bilateral calf ulcerations improved since hospitalization. No significant cellulitis. Less necrotic tissue. Persistent 2+ pitting edema. Faintly palpable DP bilaterally. Triphasic on the right. Biphasic on the left. Noncompressible. Integumentary (Hair, Skin) Wound #1 status is Open. Original cause of wound was Gradually Appeared. The wound is located on the Right,Anterior Lower Leg. The wound measures 19cm length x 15cm width x 0.3cm depth; 223.838cm^2 area and 67.152cm^3 volume. The wound is limited to skin breakdown. There is no tunneling or undermining noted. There is a large amount of serous drainage noted. The wound margin is thickened. There is small (1-33%) pink granulation within the wound bed. There is a large (67-100%) amount of necrotic tissue within the wound bed including Eschar and Adherent Slough. The periwound skin appearance exhibited: Scarring, Moist. The periwound skin appearance did not exhibit: Callus, Crepitus, Excoriation, Fluctuance, Friable, Induration, Localized Edema, Rash, Dry/Scaly, Maceration, Atrophie Blanche, Cyanosis, Ecchymosis, Hemosiderin Staining, Mottled, Pallor, Rubor, Erythema. The periwound has tenderness on palpation. Wound #10 status is Open. Original cause of wound was Gradually Appeared. The wound is located on the Left Gluteus. The wound measures 4cm length x 2cm width x 0.1cm depth; 6.283cm^2 area and 0.628cm^3 volume. The wound is limited to skin breakdown. There is no tunneling or undermining noted. There is a medium amount of serosanguineous drainage noted. The wound margin is thickened. There is large (67- 100%) red  granulation within the wound bed. There is a small (1-33%) amount of necrotic tissue within the wound bed including Eschar. The periwound skin appearance exhibited: Dry/Scaly. The periwound skin appearance did not exhibit: Callus, Crepitus, Excoriation, Fluctuance, Friable, Induration, Localized Edema, Rash, Scarring, Maceration, Moist, Atrophie Blanche, Cyanosis, Ecchymosis, Hemosiderin Staining, Mottled, Pallor, Rubor, Erythema. Wound #11 status is Open. Original cause of wound was Gradually Appeared. The wound is located on the Right Gluteus. The wound measures 0.2cm length x 0.3cm width x 0.1cm depth; 0.047cm^2 area and 0.005cm^3 volume. There is no tunneling or undermining noted. There is a medium amount of serosanguineous drainage noted. The wound margin is flat and intact. There is large (67-100%) red granulation within the wound bed. There is no necrotic tissue within the wound bed. The periwound skin appearance exhibited: Moist. Periwound temperature was noted as No Abnormality. The periwound has tenderness on palpation. Wound #3 status is Open. Original cause of wound was Gradually Appeared. The wound is located on the Left,Lateral Lower Leg. The wound measures 16cm length x 9cm width x 0.9cm depth; 113.097cm^2 area and 101.788cm^3 volume. The wound is limited to skin breakdown. There is no tunneling or undermining noted. There is a large amount of serous drainage noted. The wound margin is flat and intact. There is medium (34-66%) red granulation within the  wound bed. There is a medium (34-66%) amount of necrotic tissue within the wound bed including Adherent Slough. The periwound skin appearance exhibited: Scarring, Moist. The periwound skin appearance did not exhibit: Callus, Crepitus, Excoriation, Fluctuance, Friable, Induration, Localized Edema, Rash, Dry/Scaly, Maceration, Atrophie Blanche, Cyanosis, Ecchymosis, Hemosiderin Staining, Mottled, Pallor, Rubor, Erythema. Wound #8  status is Open. Original cause of wound was Gradually Appeared. The wound is located on the Right,Posterior Lower Leg. The wound measures 10cm length x 12cm width x 0.3cm depth; 94.248cm^2 area and 28.274cm^3 volume. The wound is limited to skin breakdown. There is no tunneling or undermining noted. There is a large amount of serous drainage noted. The wound margin is flat and intact. There is medium (34-66%) red granulation within the wound bed. There is a medium (34-66%) amount of necrotic tissue within the wound bed including Eschar and Adherent Slough. The periwound skin [name], [name] ([id number]) appearance exhibited: Moist. The periwound skin appearance did not exhibit: Callus, Crepitus, Excoriation, Fluctuance, Friable, Induration, Localized Edema, Rash, Scarring, Dry/Scaly, Maceration, Atrophie Blanche, Cyanosis, Ecchymosis, Hemosiderin Staining, Mottled, Pallor, Rubor, Erythema. Periwound temperature was noted as No Abnormality. The periwound has tenderness on palpation. Wound #9 status is Open. Original cause of wound was Gradually Appeared. The wound is located on the Left,Posterior Lower Leg. The wound measures 2cm length x 3cm width x 0.8cm depth; 4.712cm^2 area and 3.77cm^3 volume. There is no tunneling or undermining noted. There is a large amount of serous drainage noted. The wound margin is thickened. There is small (1-33%) pink granulation within the wound bed. There is a large (67-100%) amount of necrotic tissue within the wound bed including Adherent Slough. The periwound skin appearance exhibited: Scarring, Moist. Periwound temperature was noted as No Abnormality. The periwound has tenderness on palpation. Assessment Active Problems ICD-10 E11.622 - Type 2 diabetes mellitus with other skin ulcer N18.6 - End stage renal disease L97.222 - Non-pressure chronic ulcer of left calf with fat layer exposed L97.212 - Non-pressure chronic ulcer of right calf with fat layer  exposed R60.0 - Localized edema Chronic bilateral calf ulcerations. 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 There was a Skin/Subcutaneous Tissue Debridement ([id number]) debridement with total area of 50 sq cm performed by BURNS III, Teressa Senter., MD. with the following instrument(s): Curette to remove Viable and Non-Viable tissue/material including Fat, Fibrin/Slough, and Subcutaneous after achieving pain control using Lidocaine 4% Topical Solution. A time out was conducted prior to the start of the procedure. A Minimum amount of bleeding was controlled with Pressure. The procedure was tolerated well with a pain level of 2 throughout and a pain level of 2 following the procedure. Post Debridement Measurements: 19cm length x 15cm width x 0.4cm depth; 89.535cm^3 volume. Post procedure Diagnosis Wound #1: Same as Pre-Procedure Wound #3 Orcutt, Zenya H. 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 There was a biopsy performed by BURNS III, Teressa Senter., MD. There was a biopsy performed on Wound Margin. The skin was cleansed and prepped with anti-septic followed by pain control using Lidocaine Injectable: 2%. Utilizing a 3 mm tissue punch, tissue was removed at its base with the following instrument(s): Other and sent to pathology. A Minimum amount of bleeding was controlled with Silver Nitrate. A time out was conducted prior to the start of the procedure. The procedure was tolerated well with a pain level of 0 throughout and a pain level of 0 following the  procedure. Post procedure Diagnosis Wound #3: Same as Pre-Procedure General Notes: Punch biopsy taken at superior margin (12:00) of the most superior, lateral left calf ulceration (which she identified as her most chronic ulceration).. Plan Wound Cleansing: Wound #1 Right,Anterior Lower Leg: Clean wound with Normal Saline. Cleanse wound with mild soap and  water May Shower, gently pat wound dry prior to applying new dressing. Wound #10 Left Gluteus: Clean wound with Normal Saline. Cleanse wound with mild soap and water May Shower, gently pat wound dry prior to applying new dressing. Wound #11 Right Gluteus: Clean wound with Normal Saline. Cleanse wound with mild soap and water May Shower, gently pat wound dry prior to applying new dressing. Wound #3 Left,Lateral Lower Leg: Clean wound with Normal Saline. Cleanse wound with mild soap and water May Shower, gently pat wound dry prior to applying new dressing. Wound #8 Right,Posterior Lower Leg: Clean wound with Normal Saline. Cleanse wound with mild soap and water May Shower, gently pat wound dry prior to applying new dressing. Wound #9 Left,Posterior Lower Leg: Clean wound with Normal Saline. Cleanse wound with mild soap and water May Shower, gently pat wound dry prior to applying new dressing. Anesthetic: Wound #1 Right,Anterior Lower Leg: Topical Lidocaine 4% cream applied to wound bed prior to debridement Wound #3 Left,Lateral Lower Leg: Topical Lidocaine 4% cream applied to wound bed prior to debridement Wound #8 Right,Posterior Lower Leg: Topical Lidocaine 4% cream applied to wound bed prior to debridement Wound #9 Left,Posterior Lower Leg: [name], [name] ([id number]) Topical Lidocaine 4% cream applied to wound bed prior to debridement Skin Barriers/Peri-Wound Care: Wound #10 Left Gluteus: Barrier cream Wound #11 Right Gluteus: Barrier cream Primary Wound Dressing: Wound #1 Right,Anterior Lower Leg: Aquacel Ag - Or Equivalent Wound #3 Left,Lateral Lower Leg: Aquacel Ag - Or Equivalent Wound #8 Right,Posterior Lower Leg: Aquacel Ag - Or Equivalent Wound #9 Left,Posterior Lower Leg: Aquacel Ag - Or Equivalent Secondary Dressing: Wound #1 Right,Anterior Lower Leg: ABD pad - please add ABD pad to dressing Wound #3 Left,Lateral Lower Leg: ABD pad - please add ABD pad  to dressing Wound #8 Right,Posterior Lower Leg: ABD pad - please add ABD pad to dressing Wound #9 Left,Posterior Lower Leg: ABD pad - please add ABD pad to dressing Dressing Change Frequency: Wound #1 Right,Anterior Lower Leg: Change Dressing Monday, Wednesday, Friday - Wednesday in wound care center Wound #3 Left,Lateral Lower Leg: Change Dressing Monday, Wednesday, Friday - Wednesday in wound care center Wound #8 Right,Posterior Lower Leg: Change Dressing Monday, Wednesday, Friday - Wednesday in wound care center Wound #9 Left,Posterior Lower Leg: Change Dressing Monday, Wednesday, Friday - Wednesday in wound care center Follow-up Appointments: Wound #1 Right,Anterior Lower Leg: Return Appointment in 1 week. Wound #10 Left Gluteus: Return Appointment in 1 week. Wound #11 Right Gluteus: Return Appointment in 1 week. Wound #3 Left,Lateral Lower Leg: Return Appointment in 1 week. Wound #8 Right,Posterior Lower Leg: Return Appointment in 1 week. Wound #9 Left,Posterior Lower Leg: Return Appointment in 1 week. Edema Control: Wound #1 Right,Anterior Lower Leg: 3 Layer Compression System - Bilateral - Profore lite Elevate legs to the level of the heart and pump ankles as often as possible - Elevate legs at Dialysis Wound #3 Left,Lateral Lower Leg: [name], Kaleen H. ([id number]) 3 Layer Compression System - Bilateral - Profore lite Elevate legs to the level of the heart and pump ankles as often as possible - Elevate legs at Dialysis Wound #8 Right,Posterior Lower Leg: 3 Layer Compression System -  Bilateral - Profore lite Elevate legs to the level of the heart and pump ankles as often as possible - Elevate legs at Dialysis Wound #9 Left,Posterior Lower Leg: 3 Layer Compression System - Bilateral - Profore lite Elevate legs to the level of the heart and pump ankles as often as possible - Elevate legs at Dialysis Off-Loading: Wound #10 Left Gluteus: Turn and reposition every 2  hours Wound #11 Right Gluteus: Turn and reposition every 2 hours Home Health: Wound #1 Right,Anterior Lower Leg: Continue Home Health Visits - [facility]: Monday, Wednesday and Friday **WRAP GOES FROM BASE OF TOES TO THREE FINGER WIDTHS BENEATH BEND IN KNEE. Home Health Nurse may visit PRN to address patient s wound care needs. FACE TO FACE ENCOUNTER: MEDICARE and MEDICAID PATIENTS: I certify that this patient is under my care and that I had a face-to-face encounter that meets the physician face-to-face encounter requirements with this patient on this date. The encounter with the patient was in whole or in part for the following MEDICAL CONDITION: (primary reason for [place]) MEDICAL NECESSITY: I certify, that based on my findings, NURSING services are a medically necessary home health service. HOME BOUND STATUS: I certify that my clinical findings support that this patient is homebound (i.e., Due to illness or injury, pt requires aid of supportive devices such as crutches, cane, wheelchairs, walkers, the use of special transportation or the assistance of another person to leave their place of residence. There is a normal inability to leave the home and doing so requires considerable and taxing effort. Other absences are for medical reasons / religious services and are infrequent or of short duration when for other reasons). If current dressing causes regression in wound condition, may D/C ordered dressing product/s and apply Normal Saline Moist Dressing daily until next [place] / Other MD appointment. [place] of regression in wound condition at [phone]. Please direct any NON-WOUND related issues/requests for orders to patient's Primary Care Physician Wound #3 Left,Lateral Lower Leg: [place] Visits - [facility]: Monday, Wednesday and Friday **WRAP GOES FROM BASE OF TOES TO THREE FINGER WIDTHS BENEATH BEND IN KNEE. Home Health Nurse may  visit PRN to address patient s wound care needs. FACE TO FACE ENCOUNTER: MEDICARE and MEDICAID PATIENTS: I certify that this patient is under my care and that I had a face-to-face encounter that meets the physician face-to-face encounter requirements with this patient on this date. The encounter with the patient was in whole or in part for the following MEDICAL CONDITION: (primary reason for [place]) MEDICAL NECESSITY: I certify, that based on my findings, NURSING services are a medically necessary home health service. HOME BOUND STATUS: I certify that my clinical findings support that this patient is homebound (i.e., Due to illness or injury, pt requires aid of supportive devices such as crutches, cane, wheelchairs, walkers, the use of special transportation or the assistance of another person to leave their place of residence. There is a normal inability to leave the home and doing so requires considerable and taxing effort. Other absences are for medical reasons / religious services and are infrequent or of short duration when for other reasons). If current dressing causes regression in wound condition, may D/C ordered dressing product/s and apply Normal Saline Moist Dressing daily until next [place] / Other MD appointment. [place] of regression in wound condition at [phone]. Please direct any NON-WOUND related issues/requests for orders to patient's Primary Care Physician Wound #  8 Right,Posterior Lower Leg: REILEY, BERTAGNOLLI ([id number]) Continue Home Health Visits - [facility]: Monday, Wednesday and Friday **WRAP GOES FROM BASE OF TOES TO THREE FINGER WIDTHS BENEATH BEND IN KNEE. Home Health Nurse may visit PRN to address patient s wound care needs. FACE TO FACE ENCOUNTER: MEDICARE and MEDICAID PATIENTS: I certify that this patient is under my care and that I had a face-to-face encounter that meets the physician face-to-face  encounter requirements with this patient on this date. The encounter with the patient was in whole or in part for the following MEDICAL CONDITION: (primary reason for [place]) MEDICAL NECESSITY: I certify, that based on my findings, NURSING services are a medically necessary home health service. HOME BOUND STATUS: I certify that my clinical findings support that this patient is homebound (i.e., Due to illness or injury, pt requires aid of supportive devices such as crutches, cane, wheelchairs, walkers, the use of special transportation or the assistance of another person to leave their place of residence. There is a normal inability to leave the home and doing so requires considerable and taxing effort. Other absences are for medical reasons / religious services and are infrequent or of short duration when for other reasons). If current dressing causes regression in wound condition, may D/C ordered dressing product/s and apply Normal Saline Moist Dressing daily until next [place] / Other MD appointment. [place] of regression in wound condition at [phone]. Please direct any NON-WOUND related issues/requests for orders to patient's Primary Care Physician Wound #9 Left,Posterior Lower Leg: [place] Visits - [facility]: Monday, Wednesday and Friday **WRAP GOES FROM BASE OF TOES TO THREE FINGER WIDTHS BENEATH BEND IN KNEE. Home Health Nurse may visit PRN to address patient s wound care needs. FACE TO FACE ENCOUNTER: MEDICARE and MEDICAID PATIENTS: I certify that this patient is under my care and that I had a face-to-face encounter that meets the physician face-to-face encounter requirements with this patient on this date. The encounter with the patient was in whole or in part for the following MEDICAL CONDITION: (primary reason for [place]) MEDICAL NECESSITY: I certify, that based on my findings, NURSING services are a medically  necessary home health service. HOME BOUND STATUS: I certify that my clinical findings support that this patient is homebound (i.e., Due to illness or injury, pt requires aid of supportive devices such as crutches, cane, wheelchairs, walkers, the use of special transportation or the assistance of another person to leave their place of residence. There is a normal inability to leave the home and doing so requires considerable and taxing effort. Other absences are for medical reasons / religious services and are infrequent or of short duration when for other reasons). If current dressing causes regression in wound condition, may D/C ordered dressing product/s and apply Normal Saline Moist Dressing daily until next [place] / Other MD appointment. [place] of regression in wound condition at [phone]. Please direct any NON-WOUND related issues/requests for orders to patient's Primary Care Physician Continue with silver alginate and 3 layer elastic compression bandages 3 times weekly. Punch biopsy performed today. Electronic Signature(s) Signed: 06/28/2015 3:49:04 PM By: Loletha Grayer MD Entered By: Loletha Grayer on 06/28/2015 10:34:34 Fox, Aletha Halim ([id number]) Batzel, Aletha Halim ([id number]) -------------------------------------------------------------------------------- SuperBill Details Patient Name: Lamontagne, Sonna H. Date of Service: 06/28/2015 Medical Record Patient Account Number: [id number] Number: Treating RN: Ahmed Prima [birth year] (59 y.o. Other Clinician: Date of Birth/Sex: Female) Treating BURNS  III, Primary Care Physician/Extender: Hurman Horn Physician: [place] in Treatment: 21 Referring Physician: Dicky Doe Diagnosis Coding ICD-10 Codes Code Description [phone] Type 2 diabetes mellitus with other skin ulcer N18.6 End stage renal disease L97.222 Non-pressure chronic ulcer of left calf  with fat layer exposed L97.212 Non-pressure chronic ulcer of right calf with fat layer exposed R60.0 Localized edema Facility Procedures CPT4 Code: [id number] Description: 11100 - BIOPSY SKIN-ONE ICD-10 Description Diagnosis L97.222 Non-pressure chronic ulcer of left calf with fat l Modifier: ayer exposed Quantity: 1 CPT4 Code: [id number] Description: [place] - DEB SUBQ TISSUE 20 SQ CM/< ICD-10 Description Diagnosis L97.212 Non-pressure chronic ulcer of right calf with fat Modifier: layer exposed Quantity: 1 CPT4 Code: [id number] Description: 52841 - DEB SUBQ TISS EA ADDL 20CM ICD-10 Description Diagnosis L97.212 Non-pressure chronic ulcer of right calf with fat Modifier: layer exposed Quantity: 2 Physician Procedures CPT4 Code: [id number] [place], P[facility] Description: 11100 - WC PHYS BIOPSY-SKIN ONE ICD-10 Description Diagnosis L97.222 Non-pressure chronic ulcer of left calf with fat la ELA H. ([id number]) Modifier: yer exposed Quantity: 1 Electronic Signature(s) Signed: 06/28/2015 3:49:04 PM By: Loletha Grayer MD Entered By: Loletha Grayer on 06/28/2015 10:34:55

## 2015-06-30 DIAGNOSIS — N2581 Secondary hyperparathyroidism of renal origin: Secondary | ICD-10-CM | POA: Diagnosis not present

## 2015-06-30 DIAGNOSIS — D509 Iron deficiency anemia, unspecified: Secondary | ICD-10-CM | POA: Diagnosis not present

## 2015-06-30 DIAGNOSIS — N186 End stage renal disease: Secondary | ICD-10-CM | POA: Diagnosis not present

## 2015-06-30 DIAGNOSIS — D631 Anemia in chronic kidney disease: Secondary | ICD-10-CM | POA: Diagnosis not present

## 2015-06-30 DIAGNOSIS — Z992 Dependence on renal dialysis: Secondary | ICD-10-CM | POA: Diagnosis not present

## 2015-07-03 DIAGNOSIS — Z992 Dependence on renal dialysis: Secondary | ICD-10-CM | POA: Diagnosis not present

## 2015-07-03 DIAGNOSIS — N186 End stage renal disease: Secondary | ICD-10-CM | POA: Diagnosis not present

## 2015-07-03 DIAGNOSIS — D631 Anemia in chronic kidney disease: Secondary | ICD-10-CM | POA: Diagnosis not present

## 2015-07-03 DIAGNOSIS — D509 Iron deficiency anemia, unspecified: Secondary | ICD-10-CM | POA: Diagnosis not present

## 2015-07-03 DIAGNOSIS — N2581 Secondary hyperparathyroidism of renal origin: Secondary | ICD-10-CM | POA: Diagnosis not present

## 2015-07-05 ENCOUNTER — Encounter: Payer: BC Managed Care – PPO | Admitting: Surgery

## 2015-07-05 DIAGNOSIS — N186 End stage renal disease: Secondary | ICD-10-CM | POA: Diagnosis not present

## 2015-07-05 DIAGNOSIS — L97222 Non-pressure chronic ulcer of left calf with fat layer exposed: Secondary | ICD-10-CM | POA: Diagnosis not present

## 2015-07-05 DIAGNOSIS — N2581 Secondary hyperparathyroidism of renal origin: Secondary | ICD-10-CM | POA: Diagnosis not present

## 2015-07-05 DIAGNOSIS — Z992 Dependence on renal dialysis: Secondary | ICD-10-CM | POA: Diagnosis not present

## 2015-07-05 DIAGNOSIS — E11622 Type 2 diabetes mellitus with other skin ulcer: Secondary | ICD-10-CM | POA: Diagnosis not present

## 2015-07-05 DIAGNOSIS — D631 Anemia in chronic kidney disease: Secondary | ICD-10-CM | POA: Diagnosis not present

## 2015-07-05 DIAGNOSIS — L97212 Non-pressure chronic ulcer of right calf with fat layer exposed: Secondary | ICD-10-CM | POA: Diagnosis not present

## 2015-07-05 DIAGNOSIS — R600 Localized edema: Secondary | ICD-10-CM | POA: Diagnosis not present

## 2015-07-05 DIAGNOSIS — D509 Iron deficiency anemia, unspecified: Secondary | ICD-10-CM | POA: Diagnosis not present

## 2015-07-06 NOTE — Progress Notes (Signed)
[name] ([id number]) Visit Report for 07/05/2015 Arrival Information Details Patient Name: [name], [name]. Date of Service: 07/05/2015 8:00 AM Medical Record Patient Account Number: [id number] Number: Treating RN: Phillis Haggis 13-Jun-1956 (59 y.o. Other Clinician: Date of Birth/Sex: Female) Treating BURNS III, Primary Care Physician: Fidel Levy Physician/Extender: Zollie Beckers Referring Physician: Jones Broom in Treatment: 22 Visit Information History Since Last Visit All ordered tests and consults were completed: No Patient Arrived: Ambulatory Added or deleted any medications: No Arrival Time: 08:05 Any new allergies or adverse reactions: No Accompanied By: self Had a fall or experienced change in No Transfer Assistance: None activities of daily living that may affect Patient Identification Verified: Yes risk of falls: Secondary Verification Process Yes Signs or symptoms of abuse/neglect since last No Completed: visito Patient Requires Transmission- No Hospitalized since last visit: No Based Precautions: Pain Present Now: No Patient Has Alerts: Yes Patient Alerts: DMII ABI [place] Bilateral Electronic Signature(s) Signed: 07/05/2015 4:19:08 PM By: Alejandro Mulling Entered By: Alejandro Mulling on 07/05/2015 08:06:31 [name] ([id number]) -------------------------------------------------------------------------------- Encounter Discharge Information Details Patient Name: [name], [name] Date of Service: 07/05/2015 8:00 AM Medical Record Patient Account Number: [id number] Number: Treating RN: Phillis Haggis Mar 21, 1956 (59 y.o. Other Clinician: Date of Birth/Sex: Female) Treating BURNS III, Primary Care Physician: Fidel Levy Physician/Extender: Zollie Beckers Referring Physician: Jones Broom in Treatment: 22 Encounter Discharge Information Items Discharge Pain Level: 0 Discharge Condition:  Stable Ambulatory Status: Ambulatory Discharge Destination: Home Transportation: Private Auto Accompanied By: self Schedule Follow-up Appointment: Yes Medication Reconciliation completed and provided to Patient/Care Yes [name]: Provided on Clinical Summary of Care: 07/05/2015 Form Type Recipient Paper Patient PM Electronic Signature(s) Signed: 07/05/2015 9:15:05 AM By: Gwenlyn Perking Entered By: Gwenlyn Perking on 07/05/2015 09:15:05 [name], [name] ([id number]) -------------------------------------------------------------------------------- Lower Extremity Assessment Details Patient Name: [name], [name] Date of Service: 07/05/2015 8:00 AM Medical Record Patient Account Number: [id number] Number: Treating RN: Phillis Haggis 1955/08/28 (59 y.o. Other Clinician: Date of Birth/Sex: Female) Treating BURNS III, Primary Care Physician: Fidel Levy Physician/Extender: Zollie Beckers Referring Physician: Jones Broom in Treatment: 22 Edema Assessment Assessed: [Left: No] [Right: No] Edema: [Left: Yes] [Right: Yes] Calf Left: Right: Point of Measurement: 34 cm From Medial Instep 50.6 cm 45.5 cm Ankle Left: Right: Point of Measurement: 10 cm From Medial Instep 25.6 cm 24.5 cm Vascular Assessment Pulses: Posterior Tibial Dorsalis Pedis Palpable: [Left:Yes] [Right:Yes] Extremity colors, hair growth, and conditions: Extremity Color: [Left:Red] [Right:Red] Hair Growth on Extremity: [Left:No] [Right:No] Temperature of Extremity: [Left:Warm] [Right:Warm] Capillary Refill: [Left:< 3 seconds] [Right:< 3 seconds] Toe Nail Assessment Left: Right: Thick: No No Discolored: No No Deformed: No No Improper Length and Hygiene: No No Electronic Signature(s) Signed: 07/05/2015 4:19:08 PM By: Alejandro Mulling Entered By: Alejandro Mulling on 07/05/2015 08:20:58 [name], [name] ([id number]) [name], [name]  ([id number]) -------------------------------------------------------------------------------- Multi Wound Chart Details Patient Name: [name], [name] Date of Service: 07/05/2015 8:00 AM Medical Record Patient Account Number: [id number] Number: Treating RN: Phillis Haggis [birth year] (59 y.o. 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] [10:Pressure Ulcer] [11:Pressure Ulcer] Comorbid History: [1:Hypertension, Type II Diabetes] [10:Hypertension, Type II Diabetes] [11:Hypertension, Type II Diabetes] Date Acquired: [1:01/09/2015] [10:05/31/2015] [11:05/31/2015] Weeks of Treatment: [1:22] [10:4] [11:4] Wound Status: [1:Open] [10:Open] [11:Open] Measurements L x W x D 18.5x14.8x0.2 [10:3x2.5x0.1] [11:0.3x0.6x0.1] (cm) Area (cm) : [1:215.042] [10:5.89] [11:0.141] Volume (cm) : [1:43.008] [10:0.589] [11:0.014] % Reduction in Area: [1:-457436.20%] [10:37.50%] [11:82.00%] % Reduction in Volume: -860060.00% [10:37.50%] [11:91.10%] Classification: [1:Full Thickness Without Exposed Support Structures] [10:Category/Stage II] [11:Category/Stage II] HBO Classification: [1:Grade 2] [10:N/A] [11:N/A] Exudate Amount: [1:Large] [10:Medium] [11:Medium] Exudate Type: [1:Serous] [10:Serosanguineous] [11:Serosanguineous] Exudate Color: [1:amber] [10:red, brown] [11:red, brown] Wound Margin: [1:Thickened]  [10:Thickened] [11:Flat and Intact] Granulation Amount: [1:Medium (34-66%)] [10:None Present (0%)] [11:None Present (0%)] Granulation Quality: [1:Pink] [10:N/A] [11:N/A] Necrotic Amount: [1:Medium (34-66%)] [10:Large (67-100%)] [11:Large (67-100%)] Necrotic Tissue: [1:Eschar, Adherent Slough Eschar] [11:Eschar] Exposed Structures: [11:N/A] Fascia: No Fascia: No Fat: No Fat: No Tendon: No Tendon: No Muscle: No Muscle: No Joint: No Joint: No Bone: No Bone: No Limited to Skin Limited to Skin Breakdown Breakdown Epithelialization: None None None Periwound Skin Texture: Scarring: Yes Edema: No No Abnormalities Noted Edema: No Excoriation: No Excoriation: No Induration: No Induration: No Callus: No Callus: No Crepitus: No Crepitus: No Fluctuance: No Fluctuance: No Friable: No Friable: No Rash: No Rash: No Scarring: No Periwound Skin Moist: Yes Dry/Scaly: Yes Moist: Yes Moisture: Maceration: No Maceration: No Dry/Scaly: No Moist: No Periwound Skin Color: Atrophie Blanche: No Atrophie Blanche: No No Abnormalities Noted Cyanosis: No Cyanosis: No Ecchymosis: No Ecchymosis: No Erythema: No Erythema: No Hemosiderin Staining: No Hemosiderin Staining: No Mottled: No Mottled: No Pallor: No Pallor: No Rubor: No Rubor: No Temperature: No Abnormality N/A No Abnormality Tenderness on Yes No Yes Palpation: Wound Preparation: Ulcer Cleansing: Ulcer Cleansing: Other: Ulcer Cleansing: Rinsed/Irrigated with soap and water Rinsed/Irrigated with Saline Saline Topical Anesthetic Topical Anesthetic Applied: None Topical Anesthetic Applied: Other: lidocaine Applied: None 4% Assessment Notes: N/A lots of dry skin covering dry skin covering wound wound bed bed Wound Number: [id number]  Photos: No Photos No Photos No Photos Wound Location: Left Lower Leg - Lateral Right Lower Leg - Left Lower Leg - Posterior Posterior Wounding Event: Gradually Appeared Gradually Appeared  Gradually Appeared Primary Etiology: Calciphylaxis Calciphylaxis Calciphylaxis Comorbid History: Hypertension, Type II Hypertension, Type II Hypertension, Type II Diabetes Diabetes Diabetes Wenrich, Christelle H. ([id number]) Date Acquired: 01/09/2015 03/27/2015 03/13/2015 Weeks of Treatment: 22 13 10  Wound Status: Open Open Open Measurements L x W x D 15.4x9.4x0.9 8.8x10.2x0.3 4.5x2x0.6 (cm) Area (cm) : 113.694 70.497 7.069 Volume (cm) : 102.325 21.149 4.241 % Reduction in Area: -934.00% -3889.60% 55.00% % Reduction in Volume: -9202.30% -11848.60% -35.00% Classification: Full Thickness Without Full Thickness Without Full Thickness Without Exposed Support Exposed Support Exposed Support Structures Structures Structures HBO Classification: Grade 1 Grade 1 Grade 2 Exudate Amount: Large Large Large Exudate Type: Serous Serous Serous Exudate Color: amber amber amber Wound Margin: Flat and Intact Flat and Intact Thickened Granulation Amount: Medium (34-66%) Medium (34-66%) Large (67-100%) Granulation Quality: Red Red, Hyper-granulation Pink, Hyper-granulation Necrotic Amount: Medium (34-66%) Medium (34-66%) Small (1-33%) Necrotic Tissue: Adherent Careers information officerlough Eschar, Adherent Slough Adherent Slough Exposed Structures: Fascia: No Fascia: No N/A Fat: No Fat: No Tendon: No Tendon: No Muscle: No Muscle: No Joint: No Joint: No Bone: No Bone: No Limited to Skin Limited to Skin Breakdown Breakdown Epithelialization: None None None Periwound Skin Texture: Scarring: Yes Edema: Yes Scarring: Yes Edema: No Excoriation: No Excoriation: No Induration: No Induration: No Callus: No Callus: No  Crepitus: No Crepitus: No Fluctuance: No Fluctuance: No Friable: No Friable: No Rash: No Rash: No Scarring: No Periwound Skin Moist: Yes Moist: Yes Moist: Yes Moisture: Maceration: No Maceration: No Dry/Scaly: No Dry/Scaly: No Periwound Skin Color: Atrophie Blanche: No Atrophie Blanche: No  No Abnormalities Noted Cyanosis: No Cyanosis: No Ecchymosis: No Ecchymosis: No Erythema: No Erythema: No Hemosiderin Staining: No Hemosiderin Staining: No Mottled: No Mottled: No Pallor: No Pallor: No Rubor: No Rubor: No Bonine, Sherlyne H. ([id number]) Temperature: No Abnormality No Abnormality No Abnormality Tenderness on Yes Yes Yes Palpation: Wound Preparation: Ulcer Cleansing: Other: Ulcer Cleansing: Other: Ulcer Cleansing: soap and water soap and water Rinsed/Irrigated with Saline Topical Anesthetic Topical Anesthetic Applied: Other: lidocaine Applied: Other: lidocaine Topical Anesthetic 4% 4% Applied: Other: lidocaine 4% Assessment Notes: N/A N/A N/A Treatment Notes Electronic Signature(s) Signed: 07/05/2015 4:19:08 PM By: Alejandro Mulling Entered By: Alejandro Mulling on 07/05/2015 08:46:40 Ouk, [name] ([id number]) -------------------------------------------------------------------------------- Multi-Disciplinary Care Plan Details Patient Name: [name], [name] Date of Service: 07/05/2015 8:00 AM Medical Record Patient Account Number: [id number] Number: Treating RN: Phillis Haggis 1956-07-15 (59 y.o. Other Clinician: Date of Birth/Sex: Female) Treating BURNS III, Primary Care Physician: Fidel Levy Physician/Extender: Zollie Beckers Referring Physician: Jones Broom in Treatment: 22 Active Inactive Orientation to the Wound Care Program Nursing Diagnoses: Knowledge deficit related to the wound healing center program Goals: Patient/caregiver will verbalize understanding of the Wound Healing Center Program Date Initiated: 01/30/2015 Goal Status: Active Interventions: Provide education on orientation to the wound center Notes: Pain, Acute or Chronic Nursing Diagnoses: Pain, acute or chronic: actual or potential Goals: Patient will verbalize adequate pain control and receive pain control interventions during procedures  as needed Date Initiated: 01/30/2015 Goal Status: Active Patient/caregiver will verbalize adequate pain control between visits Date Initiated: 01/30/2015 Goal Status: Active Interventions: Assess comfort goal upon admission Encourage patient to take pain medications as prescribed Notes: Venous Leg Ulcer Winstead, Naylea H. ([id number]) Nursing Diagnoses: Potential for venous Insuffiency (use before diagnosis confirmed) Goals: Non-invasive venous studies are completed as ordered Date Initiated: 01/30/2015 Goal Status: Active Interventions: Assess peripheral edema status every visit. Notes: Wound/Skin Impairment Nursing Diagnoses: Impaired tissue integrity Goals: Ulcer/skin breakdown will have a volume reduction of 30% by week 4 Date Initiated: 01/30/2015 Goal Status: Active Ulcer/skin breakdown will have a volume reduction of 50% by week 8 Date Initiated: 01/30/2015 Goal Status: Active Ulcer/skin breakdown will have a volume reduction of 80% by week 12 Date Initiated: 01/30/2015 Goal Status: Active Ulcer/skin breakdown will heal within 14 weeks Date Initiated: 01/30/2015 Goal Status: Active Interventions: Assess ulceration(s) every visit Notes: Electronic Signature(s) Signed: 07/05/2015 4:19:08 PM By: Alejandro Mulling Entered By: Alejandro Mulling on 07/05/2015 08:46:26 [name], [name] ([id number]) -------------------------------------------------------------------------------- Pain Assessment Details Patient Name: [name], [name] Date of Service: 07/05/2015 8:00 AM Medical Record Patient Account Number: [id number] Number: Treating RN: Phillis Haggis 09-28-1955 (59 y.o. Other Clinician: Date of Birth/Sex: Female) Treating BURNS III, Primary Care Physician: Fidel Levy Physician/Extender: Zollie Beckers Referring Physician: Jones Broom in Treatment: 22 Active Problems Location of Pain Severity and Description of Pain Patient Has Paino  No Site Locations Pain Management and Medication Current Pain Management: Electronic Signature(s) Signed: 07/05/2015 4:19:08 PM By: Alejandro Mulling Entered By: Alejandro Mulling on 07/05/2015 08:06:42 [name] ([id number]) -------------------------------------------------------------------------------- Patient/Caregiver Education Details Patient Name: [name], [name] Date of Service: 07/05/2015 8:00 AM Medical Record Patient Account Number: [id number] Number: Treating RN: Phillis Haggis 02/05/1956 (59 y.o. Other Clinician: Date  of Birth/Gender: Female) Treating BURNS III, Primary Care Physician: Fidel Levy Physician/Extender: Zollie Beckers Referring Physician: Jones Broom in Treatment: 22 Education Assessment Education Provided To: Patient Education Topics Provided Wound/Skin Impairment: Handouts: Other: do not get wraps wet Methods: Demonstration, Explain/Verbal Responses: State content correctly Electronic Signature(s) Signed: 07/05/2015 4:19:08 PM By: Alejandro Mulling Entered By: Alejandro Mulling on 07/05/2015 08:58:16 [name], [name] ([id number]) -------------------------------------------------------------------------------- Wound Assessment Details Patient Name: Lavin, [name] H. Date of Service: 07/05/2015 8:00 AM Medical Record Patient Account Number: [id number] Number: Treating RN: Phillis Haggis 04-09-1956 (59 y.o. Other Clinician: Date of Birth/Sex: Female) Treating BURNS III, Primary Care Physician: Fidel Levy Physician/Extender: Zollie Beckers Referring Physician: Jones Broom in Treatment: 22 Wound Status Wound Number: 1 Primary Etiology: Venous Leg Ulcer Wound Location: Right Lower Leg - Anterior Wound Status: Open Wounding Event: Gradually Appeared Comorbid History: Hypertension, Type II Diabetes Date Acquired: 01/09/2015 Weeks Of Treatment: 22 Clustered Wound: No Photos Photo  Uploaded By: Elliot Gurney, RN, BSN, Kim on 07/05/2015 16:33:41 Wound Measurements Length: (cm) 18.5 Width: (cm) 14.8 Depth: (cm) 0.2 Area: (cm) 215.042 Volume: (cm) 43.008 % Reduction in Area: -457436.2% % Reduction in Volume: -860060% Epithelialization: None Tunneling: No Undermining: No Wound Description Full Thickness Without Foul Odor After Classification: Exposed Support Structures Grade 2 Rosero, Japleen H. ([id number]) Cleansing: No Diabetic Severity [address]): Wound Margin: Thickened Exudate Amount: Large Exudate Type: Serous Exudate Color: amber Wound Bed Granulation Amount: Medium (34-66%) Exposed Structure Granulation Quality: Pink Fascia Exposed: No Necrotic Amount: Medium (34-66%) Fat Layer Exposed: No Necrotic Quality: Eschar, Adherent Slough Tendon Exposed: No Muscle Exposed: No Joint Exposed: No Bone Exposed: No Limited to Skin Breakdown Periwound Skin Texture Texture Color No Abnormalities Noted: No No Abnormalities Noted: No Callus: No Atrophie Blanche: No Crepitus: No Cyanosis: No Excoriation: No Ecchymosis: No Fluctuance: No Erythema: No Friable: No Hemosiderin Staining: No Induration: No Mottled: No Localized Edema: No Pallor: No Rash: No Rubor: No Scarring: Yes Temperature / Pain Moisture Temperature: No Abnormality No Abnormalities Noted: No Tenderness on Palpation: Yes Dry / Scaly: No Maceration: No Moist: Yes Wound Preparation Ulcer Cleansing: Rinsed/Irrigated with Saline Topical Anesthetic Applied: Other: lidocaine 4%, Treatment Notes Wound #1 (Right, Anterior Lower Leg) 1. Cleansed with: Clean wound with Normal Saline 2. Anesthetic Topical Lidocaine 4% cream to wound bed prior to debridement 4. Dressing Applied: Whitenight, Sharmila H. ([id number]) Aquacel Ag 5. Secondary Dressing Applied ABD Pad 7. Secured with 3 Layer Compression System - Bilateral Electronic Signature(s) Signed: 07/05/2015 4:19:08 PM By: Alejandro Mulling Entered By: Alejandro Mulling on 07/05/2015 09:13:07 Yazdi, Satin Rexene [name] ([id number]) -------------------------------------------------------------------------------- Wound Assessment Details Patient Name: Chock, Deklyn H. Date of Service: 07/05/2015 8:00 AM Medical Record Patient Account Number: [id number] Number: Treating RN: Phillis Haggis December 31, 1955 (59 y.o. Other Clinician: Date of Birth/Sex: Female) Treating BURNS III, Primary Care Physician: Fidel Levy Physician/Extender: Zollie Beckers Referring Physician: Jones Broom in Treatment: 22 Wound Status Wound Number: 10 Primary Etiology: Pressure Ulcer Wound Location: Left Gluteus Wound Status: Open Wounding Event: Gradually Appeared Comorbid History: Hypertension, Type II Diabetes Date Acquired: 05/31/2015 Weeks Of Treatment: 4 Clustered Wound: No Photos Photo Uploaded By: Elliot Gurney, RN, BSN, Kim on 07/05/2015 16:33:43 Wound Measurements Length: (cm) 3 Width: (cm) 2.5 Depth: (cm) 0.1 Area: (cm) 5.89 Volume: (cm) 0.589 % Reduction in Area: 37.5% % Reduction in Volume: 37.5% Epithelialization: None Tunneling: No Undermining: No Wound Description Classification: Category/Stage II Foul Odor After Wound Margin: Thickened Exudate Amount: Medium Exudate Type: Serosanguineous Exudate  Color: red, brown Cleansing: No Wound Bed Granulation Amount: None Present (0%) Exposed Structure Necrotic Amount: Large (67-100%) Fascia Exposed: No Necrotic Quality: Eschar Fat Layer Exposed: No Hey, Selenne H. ([id number]) Tendon Exposed: No Muscle Exposed: No Joint Exposed: No Bone Exposed: No Limited to Skin Breakdown Periwound Skin Texture Texture Color No Abnormalities Noted: No No Abnormalities Noted: No Callus: No Atrophie Blanche: No Crepitus: No Cyanosis: No Excoriation: No Ecchymosis: No Fluctuance: No Erythema: No Friable: No Hemosiderin Staining: No Induration: No Mottled:  No Localized Edema: No Pallor: No Rash: No Rubor: No Scarring: No Moisture No Abnormalities Noted: No Dry / Scaly: Yes Maceration: No Moist: No Wound Preparation Ulcer Cleansing: Other: soap and water, Topical Anesthetic Applied: None Assessment Notes lots of dry skin covering wound bed Electronic Signature(s) Signed: 07/05/2015 4:19:08 PM By: Alejandro Mulling Entered By: Alejandro Mulling on 07/05/2015 08:38:13 Guagliardo, Isamar H. ([id number]) -------------------------------------------------------------------------------- Wound Assessment Details Patient Name: Depasquale, Cornell H. Date of Service: 07/05/2015 8:00 AM Medical Record Patient Account Number: [id number] Number: Treating RN: Phillis Haggis 14-Mar-1956 (59 y.o. Other Clinician: Date of Birth/Sex: Female) Treating BURNS III, Primary Care Physician: Fidel Levy Physician/Extender: Zollie Beckers Referring Physician: Jones Broom in Treatment: 22 Wound Status Wound Number: 11 Primary Etiology: Pressure Ulcer Wound Location: Right Gluteus Wound Status: Open Wounding Event: Gradually Appeared Comorbid History: Hypertension, Type II Diabetes Date Acquired: 05/31/2015 Weeks Of Treatment: 4 Clustered Wound: No Photos Photo Uploaded By: Elliot Gurney, RN, BSN, Kim on 07/05/2015 16:33:43 Wound Measurements Length: (cm) 0.3 Width: (cm) 0.6 Depth: (cm) 0.1 Area: (cm) 0.141 Volume: (cm) 0.014 % Reduction in Area: 82% % Reduction in Volume: 91.1% Epithelialization: None Tunneling: No Undermining: No Wound Description Classification: Category/Stage II Foul Odor After Wound Margin: Flat and Intact Exudate Amount: Medium Exudate Type: Serosanguineous Exudate Color: red, brown Cleansing: No Wound Bed Granulation Amount: None Present (0%) Necrotic Amount: Large (67-100%) Necrotic Quality: Eschar Vanhorn, Alonie H. ([id number]) Periwound Skin Texture Texture Color No Abnormalities Noted:  No No Abnormalities Noted: No Moisture Temperature / Pain No Abnormalities Noted: No Temperature: No Abnormality Moist: Yes Tenderness on Palpation: Yes Wound Preparation Ulcer Cleansing: Rinsed/Irrigated with Saline Topical Anesthetic Applied: None Assessment Notes dry skin covering wound bed Electronic Signature(s) Signed: 07/05/2015 4:19:08 PM By: Alejandro Mulling Entered By: Alejandro Mulling on 07/05/2015 08:38:47 Tomei, Acquanetta H. ([id number]) -------------------------------------------------------------------------------- Wound Assessment Details Patient Name: Bodkins, Trinetta H. Date of Service: 07/05/2015 8:00 AM Medical Record Patient Account Number: [id number] Number: Treating RN: Phillis Haggis [birth year] (59 y.o. Other Clinician: Date of Birth/Sex: Female) Treating BURNS III, Primary Care Physician: Fidel Levy Physician/Extender: Zollie Beckers Referring Physician: Jones Broom in Treatment: 22 Wound Status Wound Number: 3 Primary Etiology: Calciphylaxis Wound Location: Left Lower Leg - Lateral Wound Status: Open Wounding Event: Gradually Appeared Comorbid History: Hypertension, Type II Diabetes Date Acquired: 01/09/2015 Weeks Of Treatment: 22 Clustered Wound: No Photos Photo Uploaded By: Elliot Gurney, RN, BSN, Kim on 07/05/2015 16:47:16 Wound Measurements Length: (cm) 15.4 Width: (cm) 9.4 Depth: (cm) 0.9 Area: (cm) 113.694 Volume: (cm) 102.325 % Reduction in Area: -934% % Reduction in Volume: -9202.3% Epithelialization: None Tunneling: No Undermining: No Wound Description Full Thickness Without Exposed Classification: Support Structures Grade 1 Pardon, Pheobe H. ([id number]) Diabetic Severity [address]): Wound Margin: Flat and Intact Exudate Amount: Large Exudate Type: Serous Exudate Color: amber Wound Bed Granulation Amount: Medium (34-66%) Exposed Structure Granulation Quality: Red Fascia Exposed: No Necrotic Amount:  Medium (34-66%) Fat Layer Exposed: No Necrotic Quality: Adherent Slough Tendon Exposed:  No Muscle Exposed: No Joint Exposed: No Bone Exposed: No Limited to Skin Breakdown Periwound Skin Texture Texture Color No Abnormalities Noted: No No Abnormalities Noted: No Callus: No Atrophie Blanche: No Crepitus: No Cyanosis: No Excoriation: No Ecchymosis: No Fluctuance: No Erythema: No Friable: No Hemosiderin Staining: No Induration: No Mottled: No Localized Edema: No Pallor: No Rash: No Rubor: No Scarring: Yes Temperature / Pain Moisture Temperature: No Abnormality No Abnormalities Noted: No Tenderness on Palpation: Yes Dry / Scaly: No Maceration: No Moist: Yes Wound Preparation Ulcer Cleansing: Other: soap and water, Topical Anesthetic Applied: Other: lidocaine 4%, Treatment Notes Wound #3 (Left, Lateral Lower Leg) 1. Cleansed with: Clean wound with Normal Saline 2. Anesthetic Topical Lidocaine 4% cream to wound bed prior to debridement 4. Dressing Applied: Face, Jamelle H. ([id number]) Aquacel Ag 5. Secondary Dressing Applied ABD Pad 7. Secured with 3 Layer Compression System - Bilateral Electronic Signature(s) Signed: 07/05/2015 4:19:08 PM By: Alejandro Mulling Entered By: Alejandro Mulling on 07/05/2015 08:32:40 Dispenza, Jesslyn Rexene [name] ([id number]) -------------------------------------------------------------------------------- Wound Assessment Details Patient Name: Harkleroad, Shellie H. Date of Service: 07/05/2015 8:00 AM Medical Record Patient Account Number: [id number] Number: Treating RN: Phillis Haggis [birth year] (59 y.o. Other Clinician: Date of Birth/Sex: Female) Treating BURNS III, Primary Care Physician: Fidel Levy Physician/Extender: Zollie Beckers Referring Physician: Jones Broom in Treatment: 22 Wound Status Wound Number: 8 Primary Etiology: Calciphylaxis Wound Location: Right Lower Leg - Posterior Wound Status:  Open Wounding Event: Gradually Appeared Comorbid History: Hypertension, Type II Diabetes Date Acquired: 03/27/2015 Weeks Of Treatment: 13 Clustered Wound: No Photos Photo Uploaded By: Elliot Gurney, RN, BSN, Kim on 07/05/2015 16:47:16 Wound Measurements Length: (cm) 8.8 Width: (cm) 10.2 Depth: (cm) 0.3 Area: (cm) 70.497 Volume: (cm) 21.149 % Reduction in Area: -3889.6% % Reduction in Volume: -11848.6% Epithelialization: None Tunneling: No Undermining: No Wound Description Full Thickness Without Foul Odor After Classification: Exposed Support Structures Diabetic Severity Grade 1 (Wagner): Wound Margin: Flat and Intact Exudate Amount: Large Exudate Type: Serous Exudate Color: amber Cleansing: No Wound Bed Hodder, Cami H. ([id number]) Granulation Amount: Medium (34-66%) Exposed Structure Granulation Quality: Red, Hyper-granulation Fascia Exposed: No Necrotic Amount: Medium (34-66%) Fat Layer Exposed: No Necrotic Quality: Eschar, Adherent Slough Tendon Exposed: No Muscle Exposed: No Joint Exposed: No Bone Exposed: No Limited to Skin Breakdown Periwound Skin Texture Texture Color No Abnormalities Noted: No No Abnormalities Noted: No Callus: No Atrophie Blanche: No Crepitus: No Cyanosis: No Excoriation: No Ecchymosis: No Fluctuance: No Erythema: No Friable: No Hemosiderin Staining: No Induration: No Mottled: No Localized Edema: Yes Pallor: No Rash: No Rubor: No Scarring: No Temperature / Pain Moisture Temperature: No Abnormality No Abnormalities Noted: No Tenderness on Palpation: Yes Dry / Scaly: No Maceration: No Moist: Yes Wound Preparation Ulcer Cleansing: Other: soap and water, Topical Anesthetic Applied: Other: lidocaine 4%, Treatment Notes Wound #8 (Right, Posterior Lower Leg) 1. Cleansed with: Clean wound with Normal Saline 2. Anesthetic Topical Lidocaine 4% cream to wound bed prior to debridement 4. Dressing Applied: Aquacel Ag 5.  Secondary Dressing Applied ABD Pad 7. Secured with 3 Layer Compression System - Bilateral Electronic Signature(s) Signed: 07/05/2015 4:19:08 PM By: Almedia Balls, Dandria Rexene [name] ([id number]) Entered By: Alejandro Mulling on 07/05/2015 08:26:33 Neuman, Ronnetta Rexene [name] ([id number]) -------------------------------------------------------------------------------- Wound Assessment Details Patient Name: Degner, Raylan H. Date of Service: 07/05/2015 8:00 AM Medical Record Patient Account Number: [id number] Number: Treating RN: Phillis Haggis [birth year] (59 y.o. Other Clinician: Date of Birth/Sex: Female) Treating BURNS III, Primary Care Physician: Fidel Levy Physician/Extender: Zollie Beckers  Referring Physician: Jones Broom in Treatment: 22 Wound Status Wound Number: 9 Primary Etiology: Calciphylaxis Wound Location: Left Lower Leg - Posterior Wound Status: Open Wounding Event: Gradually Appeared Comorbid History: Hypertension, Type II Diabetes Date Acquired: 03/13/2015 Weeks Of Treatment: 10 Clustered Wound: No Photos Photo Uploaded By: Elliot Gurney, RN, BSN, Kim on 07/05/2015 16:47:17 Wound Measurements Length: (cm) 4.5 Width: (cm) 2 Depth: (cm) 0.6 Area: (cm) 7.069 Volume: (cm) 4.241 % Reduction in Area: 55% % Reduction in Volume: -35% Epithelialization: None Tunneling: No Undermining: No Wound Description Full Thickness Without Foul Odor After Classification: Exposed Support Structures Diabetic Severity Grade 2 (Wagner): Wound Margin: Thickened Exudate Amount: Large Exudate Type: Serous Exudate Color: amber Cleansing: No Wound Bed Silvestri, Finnlee H. ([id number]) Granulation Amount: Large (67-100%) Granulation Quality: Pink, Hyper-granulation Necrotic Amount: Small (1-33%) Necrotic Quality: Adherent Slough Periwound Skin Texture Texture Color No Abnormalities Noted: No No Abnormalities Noted: No Scarring: Yes Temperature /  Pain Moisture Temperature: No Abnormality No Abnormalities Noted: No Tenderness on Palpation: Yes Moist: Yes Wound Preparation Ulcer Cleansing: Rinsed/Irrigated with Saline Topical Anesthetic Applied: Other: lidocaine 4%, Treatment Notes Wound #9 (Left, Posterior Lower Leg) 1. Cleansed with: Clean wound with Normal Saline 2. Anesthetic Topical Lidocaine 4% cream to wound bed prior to debridement 4. Dressing Applied: Aquacel Ag 5. Secondary Dressing Applied ABD Pad 7. Secured with 3 Layer Compression System - Bilateral Electronic Signature(s) Signed: 07/05/2015 4:19:08 PM By: Alejandro Mulling Entered By: Alejandro Mulling on 07/05/2015 08:34:12 Panella, Nyjai Rexene [name] ([id number]) -------------------------------------------------------------------------------- Vitals Details Patient Name: Ciaravino, Fusako H. Date of Service: 07/05/2015 8:00 AM Medical Record Patient Account Number: [id number] Number: Treating RN: Phillis Haggis 06/16/1956 (59 y.o. Other Clinician: Date of Birth/Sex: Female) Treating BURNS III, Primary Care Physician: Fidel Levy Physician/Extender: Zollie Beckers Referring Physician: Jones Broom in Treatment: 22 Vital Signs Time Taken: 08:09 Temperature (F): 98.1 Height (in): 68 Pulse (bpm): 86 Weight (lbs): 294 Respiratory Rate (breaths/min): 18 Body Mass Index (BMI): 44.7 Blood Pressure (mmHg): 145/59 Reference Range: 80 - 120 mg / dl Electronic Signature(s) Signed: 07/05/2015 4:19:08 PM By: Alejandro Mulling Entered By: Alejandro Mulling on 07/05/2015 08:10:29

## 2015-07-06 NOTE — Progress Notes (Signed)
[name], [name] ([id number]) Visit Report for 07/05/2015 Chief Complaint Document Details Patient Name: [name], [name]. Date of Service: 07/05/2015 8:00 AM Medical Record Patient Account Number: [id number] Number: Treating RN: Huel Coventry 21-Oct-1955 (59 y.o. Other Clinician: Date of Birth/Sex: Female) Treating BURNS III, Primary Care Physician/Extender: Karn Cassis Physician: Referring Physician: Jones Broom in Treatment: 22 Information Obtained from: Patient Chief Complaint Chronic bilateral calf ulcers. Electronic Signature(s) Signed: 07/05/2015 1:01:02 PM By: Madelaine Bhat MD Entered By: Madelaine Bhat on 07/05/2015 12:34:52 Vasey, [name] ([id number]) -------------------------------------------------------------------------------- Debridement Details Patient Name: [name], [name] Date of Service: 07/05/2015 8:00 AM Medical Record Patient Account Number: [id number] Number: Treating RN: Huel Coventry June 14, 1956 (59 y.o. Other Clinician: Date of Birth/Sex: Female) Treating BURNS III, Primary Care Physician/Extender: Karn Cassis Physician: Referring Physician: Jones Broom in Treatment: 22 Debridement Performed for Wound #1 Right,Anterior Lower Leg Assessment: Performed By: Physician BURNS III, Melanie Crazier., MD Debridement: Debridement Pre-procedure Yes Verification/Time Out Taken: Start Time: 08:48 Pain Control: Other : lidocaine 4% Level: Skin/Subcutaneous Tissue Total Area Debrided (L x 18.5 (cm) x 14.8 (cm) = 273.8 (cm) W): Tissue and other Viable, Non-Viable, Exudate, Fat, Fibrin/Slough, Subcutaneous material debrided: Instrument: Curette Bleeding: Minimum Hemostasis Achieved: Pressure End Time: 09:00 Procedural Pain: 0 Post Procedural Pain: 0 Response to Treatment: Procedure was tolerated well Post Debridement Measurements of Total Wound Length: (cm) 18.5 Width:  (cm) 14.8 Depth: (cm) 0.3 Volume: (cm) 64.513 Post Procedure Diagnosis Same as Pre-procedure Electronic Signature(s) Signed: 07/05/2015 1:01:02 PM By: Madelaine Bhat MD Signed: 07/05/2015 4:52:55 PM By: Elliot Gurney, RN, BSN, Kim RN, BSN Entered By: Madelaine Bhat on 07/05/2015 12:34:18 Moulder, [name] ([id number]) Colden, Haileyann H. ([id number]) -------------------------------------------------------------------------------- Debridement Details Patient Name: [name], [name] Date of Service: 07/05/2015 8:00 AM Medical Record Patient Account Number: [id number] Number: Treating RN: Huel Coventry 1956/03/11 (59 y.o. Other Clinician: Date of Birth/Sex: Female) Treating BURNS III, Primary Care Physician/Extender: Karn Cassis Physician: Referring Physician: Jones Broom in Treatment: 22 Debridement Performed for Wound #3 Left,Lateral Lower Leg Assessment: Performed By: Physician BURNS III, Melanie Crazier., MD Debridement: Debridement Pre-procedure Yes Verification/Time Out Taken: Start Time: 08:48 Pain Control: Other : lidocaine 4% Level: Skin/Subcutaneous Tissue Total Area Debrided (L x 15.4 (cm) x 9.4 (cm) = 144.76 (cm) W): Tissue and other Viable, Non-Viable, Exudate, Fat, Fibrin/Slough, Subcutaneous material debrided: Instrument: Curette Bleeding: Minimum Hemostasis Achieved: Pressure End Time: 09:00 Procedural Pain: 0 Post Procedural Pain: 0 Response to Treatment: Procedure was tolerated well Post Debridement Measurements of Total Wound Length: (cm) 15.4 Width: (cm) 9.4 Depth: (cm) 1 Volume: (cm) 113.694 Post Procedure Diagnosis Same as Pre-procedure Electronic Signature(s) Signed: 07/05/2015 1:01:02 PM By: Madelaine Bhat MD Signed: 07/05/2015 4:52:55 PM By: Elliot Gurney, RN, BSN, Kim RN, BSN Entered By: Madelaine Bhat on 07/05/2015 12:34:32 Eshleman, [name] ([id number]) Hockett, Taleeya H.  ([id number]) -------------------------------------------------------------------------------- Debridement Details Patient Name: Sumpter, [name] Date of Service: 07/05/2015 8:00 AM Medical Record Patient Account Number: [id number] Number: Treating RN: Huel Coventry [birth year] (59 y.o. Other Clinician: Date of Birth/Sex: Female) Treating BURNS III, Primary Care Physician/Extender: Karn Cassis Physician: Referring Physician: Jones Broom in Treatment: 22 Debridement Performed for Wound #8 Right,Posterior Lower Leg Assessment: Performed By: Physician BURNS III, Melanie Crazier., MD Debridement: Debridement Pre-procedure Yes Verification/Time Out Taken: Start Time: 08:48 Pain Control: Other : lidocaine 4% Level: Skin/Subcutaneous Tissue Total Area Debrided (L x 8.8 (  cm) x 10.2 (cm) = 89.76 (cm) W): Tissue and other Viable, Non-Viable, Exudate, Fat, Fibrin/Slough, Subcutaneous material debrided: Instrument: Curette Bleeding: Minimum Hemostasis Achieved: Pressure End Time: 09:00 Procedural Pain: 0 Post Procedural Pain: 0 Response to Treatment: Procedure was tolerated well Post Debridement Measurements of Total Wound Length: (cm) 8.8 Width: (cm) 10.2 Depth: (cm) 0.4 Volume: (cm) 28.199 Post Procedure Diagnosis Same as Pre-procedure Electronic Signature(s) Signed: 07/05/2015 1:01:02 PM By: Madelaine Bhat MD Signed: 07/05/2015 4:52:55 PM By: Elliot Gurney, RN, BSN, Kim RN, BSN Entered By: Madelaine Bhat on 07/05/2015 12:34:44 Obremski, [name] ([id number]) Baril, Aylinn H. ([id number]) -------------------------------------------------------------------------------- HPI Details Patient Name: [name], [name] Date of Service: 07/05/2015 8:00 AM Medical Record Patient Account Number: [id number] Number: Treating RN: Huel Coventry 1955-10-24 (59 y.o. Other Clinician: Date of Birth/Sex: Female) Treating BURNS III, Primary Care  Physician/Extender: Karn Cassis Physician: Referring Physician: Jones Broom in Treatment: 22 History of Present Illness HPI Description: 59 year old with h/o DM (Hgb A1c 7.4 in Aug 2016), ESRD (on hemodialysis since July 2016). Presented to her PCP Dr. Venora Maples for BLE ulcers since June 2016. She was taken to the OR on 03/13/2015 for a angioplasty of her left arm AV fistula. No lower extremity debridement or biopsies. 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 No record of venous US to assess for venous insufficiency. Patient has declined [place]. Culture 04/19/2015 grew methicillin sensitive staph aureus (sensitive to tetracycline), Stenotrophomonas maltophilia, and Enterococcus faecalis. Completed doxycycline. Recently hospitalized for worsening cellulitis. Treated with IV antibiotics. No operative debridement or biopsy. Punch biopsy of left calf ulcer 06/28/2015 showed no evidence for malignancy. Performing dressing changes with silver alginate. Tolerating Profore light bilaterally. She returns to clinic and reports that her legs feel better after course of antibiotics and with compression. No fever or chills. Less drainage. Electronic Signature(s) Signed: 07/05/2015 1:01:02 PM By: Madelaine Bhat MD Entered By: Madelaine Bhat on 07/05/2015 12:35:45 Vanvleck, [name] ([id number]) -------------------------------------------------------------------------------- Physical Exam Details Patient Name: [name], [name] Date of Service: 07/05/2015 8:00 AM Medical Record Patient Account Number: [id number] Number: Treating RN: Huel Coventry 10-07-1955 (59 y.o. Other Clinician: Date of Birth/Sex: Female) Treating BURNS III, Primary Care Physician/Extender: Karn Cassis Physician: Referring Physician: Fidel Levy Weeks in Treatment: 22 Constitutional . Pulse regular. Respirations normal and unlabored. Afebrile. . Notes Bilateral calf ulcerations improved since hospitalization. No significant cellulitis. Less necrotic tissue. Persistent 2+ pitting edema. Faintly palpable DP bilaterally. Triphasic on the right. Biphasic on the left. Noncompressible. Electronic Signature(s) Signed: 07/05/2015 1:01:02 PM By: Madelaine Bhat MD Entered By: Madelaine Bhat on 07/05/2015 12:36:08 Wieand, [name] ([id number]) -------------------------------------------------------------------------------- Physician Orders Details Patient Name: Melfi, Krithi H. Date of Service: 07/05/2015 8:00 AM Medical Record Patient Account Number: [id number] Number: Treating RN: Phillis Haggis October 21, 1955 (59 y.o. Other Clinician: Date of Birth/Sex: Female) Treating BURNS III, Primary Care Physician/Extender: Karn Cassis Physician: Referring Physician: Jones Broom in Treatment: 22 Verbal / Phone Orders: Yes Clinician: Ashok Cordia, Debi Read Back and Verified: Yes Diagnosis Coding ICD-10 Coding Code Description E11.622 Type 2 diabetes mellitus with other skin ulcer N18.6 End stage renal disease L97.222 Non-pressure chronic ulcer of left calf with fat layer exposed L97.212 Non-pressure chronic ulcer of right calf with fat layer exposed R60.0 Localized edema Wound Cleansing Wound #1 Right,Anterior Lower Leg o Clean wound with  Normal Saline. o Cleanse wound with mild soap and water o May Shower, gently pat wound dry prior to applying new dressing. Wound #10 Left Gluteus o Clean wound with Normal Saline. o Cleanse wound with mild soap and water o May Shower, gently pat wound dry prior to applying new dressing. Wound #11 Right Gluteus o Clean wound with Normal Saline. o Cleanse wound with mild soap and water o May Shower, gently pat wound dry prior to  applying new dressing. Wound #3 Left,Lateral Lower Leg o Clean wound with Normal Saline. o Cleanse wound with mild soap and water o May Shower, gently pat wound dry prior to applying new dressing. Wound #8 Right,Posterior Lower Leg o Clean wound with Normal Saline. o Cleanse wound with mild soap and water o May Shower, gently pat wound dry prior to applying new dressing. Ayllon, Allysen H. ([id number]) Wound #9 Left,Posterior Lower Leg o Clean wound with Normal Saline. o Cleanse wound with mild soap and water o May Shower, gently pat wound dry prior to applying new dressing. Anesthetic Wound #1 Right,Anterior Lower Leg o Topical Lidocaine 4% cream applied to wound bed prior to debridement Wound #3 Left,Lateral Lower Leg o Topical Lidocaine 4% cream applied to wound bed prior to debridement Wound #8 Right,Posterior Lower Leg o Topical Lidocaine 4% cream applied to wound bed prior to debridement Wound #9 Left,Posterior Lower Leg o Topical Lidocaine 4% cream applied to wound bed prior to debridement Skin Barriers/Peri-Wound Care Wound #10 Left Gluteus o Barrier cream Wound #11 Right Gluteus o Barrier cream Primary Wound Dressing Wound #1 Right,Anterior Lower Leg o Aquacel Ag - Or Equivalent Wound #3 Left,Lateral Lower Leg o Aquacel Ag - Or Equivalent Wound #8 Right,Posterior Lower Leg o Aquacel Ag - Or Equivalent Wound #9 Left,Posterior Lower Leg o Aquacel Ag - Or Equivalent Secondary Dressing Wound #1 Right,Anterior Lower Leg o ABD pad - please add ABD pad to dressing Wound #3 Left,Lateral Lower Leg o ABD pad - please add ABD pad to dressing Wound #8 Right,Posterior Lower Leg o ABD pad - please add ABD pad to dressing Marton, Delise H. ([id number]) Wound #9 Left,Posterior Lower Leg o ABD pad - please add ABD pad to dressing Dressing Change Frequency Wound #1 Right,Anterior Lower Leg o Change Dressing Monday, Wednesday,  Friday - Wednesday in wound care center Wound #3 Left,Lateral Lower Leg o Change Dressing Monday, Wednesday, Friday - Wednesday in wound care center Wound #8 Right,Posterior Lower Leg o Change Dressing Monday, Wednesday, Friday - Wednesday in wound care center Wound #9 Left,Posterior Lower Leg o Change Dressing Monday, Wednesday, Friday - Wednesday in wound care center Follow-up Appointments Wound #1 Right,Anterior Lower Leg o Return Appointment in 1 week. Wound #10 Left Gluteus o Return Appointment in 1 week. Wound #11 Right Gluteus o Return Appointment in 1 week. Wound #3 Left,Lateral Lower Leg o Return Appointment in 1 week. Wound #8 Right,Posterior Lower Leg o Return Appointment in 1 week. Wound #9 Left,Posterior Lower Leg o Return Appointment in 1 week. Edema Control Wound #1 Right,Anterior Lower Leg o 3 Layer Compression System - Bilateral - Profore lite o Elevate legs to the level of the heart and pump ankles as often as possible - Elevate legs at Dialysis Wound #3 Left,Lateral Lower Leg o 3 Layer Compression System - Bilateral - Profore lite o Elevate legs to the level of the heart and pump ankles as often as possible - Elevate legs at Dialysis Wound #8 Right,Posterior Lower Leg Corrow, Tifani H. ([id number])   o 3 Layer Compression System - Bilateral - Profore lite o Elevate legs to the level of the heart and pump ankles as often as possible - Elevate legs at Dialysis Wound #9 Left,Posterior Lower Leg o 3 Layer Compression System - Bilateral - Profore lite o Elevate legs to the level of the heart and pump ankles as often as possible - Elevate legs at Dialysis Off-Loading Wound #10 Left Gluteus o Turn and reposition every 2 hours Wound #11 Right Gluteus o Turn and reposition every 2 hours Home Health Wound #1 Right,Anterior Lower Leg o Continue Home Health Visits - Advanced: Monday, Wednesday and Friday **WRAP GOES FROM BASE  OF TOES TO THREE FINGER WIDTHS BENEATH BEND IN KNEE. o Home Health Nurse may visit PRN to address patientos wound care needs. o FACE TO FACE ENCOUNTER: MEDICARE and MEDICAID PATIENTS: I certify that this patient is under my care and that I had a face-to-face encounter that meets the physician face-to-face encounter requirements with this patient on this date. The encounter with the patient was in whole or in part for the following MEDICAL CONDITION: (primary reason for Home Healthcare) MEDICAL NECESSITY: I certify, that based on my findings, NURSING services are a medically necessary home health service. HOME BOUND STATUS: I certify that my clinical findings support that this patient is homebound (i.e., Due to illness or injury, pt requires aid of supportive devices such as crutches, cane, wheelchairs, walkers, the use of special transportation or the assistance of another person to leave their place of residence. There is a normal inability to leave the home and doing so requires considerable and taxing effort. Other absences are for medical reasons / religious services and are infrequent or of short duration when for other reasons). o If current dressing causes regression in wound condition, may D/C ordered dressing product/s and apply Normal Saline Moist Dressing daily until next Wound Healing Center / Other MD appointment. Notify Wound Healing Center of regression in wound condition at [phone]. o Please direct any NON-WOUND related issues/requests for orders to patient's Primary Care Physician Wound #3 Left,Lateral Lower Leg o Continue Home Health Visits - Advanced: Monday, Wednesday and Friday **WRAP GOES FROM BASE OF TOES TO THREE FINGER WIDTHS BENEATH BEND IN KNEE. o Home Health Nurse may visit PRN to address patientos wound care needs. o FACE TO FACE ENCOUNTER: MEDICARE and MEDICAID PATIENTS: I certify that this patient is under my care and that I had a face-to-face  encounter that meets the physician face-to-face encounter requirements with this patient on this date. The encounter with the patient was in whole or in part for the following MEDICAL CONDITION: (primary reason for Home Healthcare) MEDICAL NECESSITY: I certify, that based on my findings, NURSING services are a medically necessary home health service. HOME BOUND STATUS: I certify that my clinical findings Mandarino, Marvetta H. ([id number]) support that this patient is homebound (i.e., Due to illness or injury, pt requires aid of supportive devices such as crutches, cane, wheelchairs, walkers, the use of special transportation or the assistance of another person to leave their place of residence. There is a normal inability to leave the home and doing so requires considerable and taxing effort. Other absences are for medical reasons / religious services and are infrequent or of short duration when for other reasons). o If current dressing causes regression in wound condition, may D/C ordered dressing product/s and apply Normal Saline Moist Dressing daily until next Wound Healing Center / Other MD appointment. Notify Wound Healing  Center of regression in wound condition at [phone]. o Please direct any NON-WOUND related issues/requests for orders to patient's Primary Care Physician Wound #8 Right,Posterior Lower Leg o Continue Home Health Visits - Advanced: Monday, Wednesday and Friday **WRAP GOES FROM BASE OF TOES TO THREE FINGER WIDTHS BENEATH BEND IN KNEE. o Home Health Nurse may visit PRN to address patientos wound care needs. o FACE TO FACE ENCOUNTER: MEDICARE and MEDICAID PATIENTS: I certify that this patient is under my care and that I had a face-to-face encounter that meets the physician face-to-face encounter requirements with this patient on this date. The encounter with the patient was in whole or in part for the following MEDICAL CONDITION: (primary reason for Home  Healthcare) MEDICAL NECESSITY: I certify, that based on my findings, NURSING services are a medically necessary home health service. HOME BOUND STATUS: I certify that my clinical findings support that this patient is homebound (i.e., Due to illness or injury, pt requires aid of supportive devices such as crutches, cane, wheelchairs, walkers, the use of special transportation or the assistance of another person to leave their place of residence. There is a normal inability to leave the home and doing so requires considerable and taxing effort. Other absences are for medical reasons / religious services and are infrequent or of short duration when for other reasons). o If current dressing causes regression in wound condition, may D/C ordered dressing product/s and apply Normal Saline Moist Dressing daily until next Wound Healing Center / Other MD appointment. Notify Wound Healing Center of regression in wound condition at [phone]. o Please direct any NON-WOUND related issues/requests for orders to patient's Primary Care Physician Wound #9 Left,Posterior Lower Leg o Continue Home Health Visits - Advanced: Monday, Wednesday and Friday **WRAP GOES FROM BASE OF TOES TO THREE FINGER WIDTHS BENEATH BEND IN KNEE. o Home Health Nurse may visit PRN to address patientos wound care needs. o FACE TO FACE ENCOUNTER: MEDICARE and MEDICAID PATIENTS: I certify that this patient is under my care and that I had a face-to-face encounter that meets the physician face-to-face encounter requirements with this patient on this date. The encounter with the patient was in whole or in part for the following MEDICAL CONDITION: (primary reason for Home Healthcare) MEDICAL NECESSITY: I certify, that based on my findings, NURSING services are a medically necessary home health service. HOME BOUND STATUS: I certify that my clinical findings support that this patient is homebound (i.e., Due to illness or injury,  pt requires aid of supportive devices such as crutches, cane, wheelchairs, walkers, the use of special transportation or the assistance of another person to leave their place of residence. There is a normal inability to leave the home and doing so requires considerable and taxing effort. Other absences are for medical reasons / religious services and are infrequent or of short duration when for other reasons). Trevathan, Duyen H. ([id number]) o If current dressing causes regression in wound condition, may D/C ordered dressing product/s and apply Normal Saline Moist Dressing daily until next Wound Healing Center / Other MD appointment. Notify Wound Healing Center of regression in wound condition at [phone]. o Please direct any NON-WOUND related issues/requests for orders to patient's Primary Care Physician Electronic Signature(s) Signed: 07/05/2015 1:01:02 PM By: Madelaine Bhat MD Signed: 07/05/2015 4:19:08 PM By: Alejandro Mulling Entered By: Alejandro Mulling on 07/05/2015 08:56:10 Montemayor, Zhaniya Rexene Edison ([id number]) -------------------------------------------------------------------------------- Problem List Details Patient Name: [name], [name] Date of Service: 07/05/2015 8:00 AM Medical Record Patient Account  Number: [id number] Number: Treating RN: Phillis Haggis 1956-04-24 (59 y.o. Other Clinician: Date of Birth/Sex: Female) Treating BURNS III, Primary Care Physician/Extender: Karn Cassis Physician: Referring Physician: Jones Broom in Treatment: 22 Active Problems ICD-10 Encounter Code Description Active Date Diagnosis E11.622 Type 2 diabetes mellitus with other skin ulcer 01/30/2015 Yes N18.6 End stage renal disease 01/30/2015 Yes L97.222 Non-pressure chronic ulcer of left calf with fat layer 01/30/2015 Yes exposed L97.212 Non-pressure chronic ulcer of right calf with fat layer 01/30/2015 Yes exposed R60.0 Localized edema  04/19/2015 Yes Inactive Problems Resolved Problems ICD-10 Code Description Active Date Resolved Date L03.115 Cellulitis of right lower limb 05/31/2015 05/31/2015 L89.152 Pressure ulcer of sacral region, stage 2 06/07/2015 06/07/2015 L03.116 Cellulitis of left lower limb 05/31/2015 05/31/2015 JOYELL, EMAMI ([id number]) Electronic Signature(s) Signed: 07/05/2015 1:01:02 PM By: Madelaine Bhat MD Entered By: Madelaine Bhat on 07/05/2015 12:33:56 Manner, Miata H. ([id number]) -------------------------------------------------------------------------------- Progress Note Details Patient Name: Bonner, [name] Date of Service: 07/05/2015 8:00 AM Medical Record Patient Account Number: [id number] Number: Treating RN: Huel Coventry Mar 07, 1956 (59 y.o. Other Clinician: Date of Birth/Sex: Female) Treating BURNS III, Primary Care Physician/Extender: Karn Cassis Physician: Referring Physician: Jones Broom in Treatment: 22 Subjective Chief Complaint Information obtained from Patient Chronic bilateral calf ulcers. History of Present Illness (HPI) 59 year old with h/o DM (Hgb A1c 7.4 in Aug 2016), ESRD (on hemodialysis since July 2016). Presented to her PCP Dr. Venora Maples for BLE ulcers since June 2016. She was taken to the OR on 03/13/2015 for a angioplasty of her left arm AV fistula. No lower extremity debridement or biopsies. 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 No record of venous US to assess for venous insufficiency. Patient has declined [place]. Culture 04/19/2015 grew methicillin sensitive staph aureus (sensitive to tetracycline), Stenotrophomonas maltophilia, and Enterococcus faecalis. Completed doxycycline. Recently hospitalized for worsening cellulitis. Treated with IV antibiotics. No operative debridement  or biopsy. Punch biopsy of left calf ulcer 06/28/2015 showed no evidence for malignancy. Performing dressing changes with silver alginate. Tolerating Profore light bilaterally. She returns to clinic and reports that her legs feel better after course of antibiotics and with compression. No fever or chills. Less drainage. Objective Constitutional Pulse regular. Respirations normal and unlabored. Afebrile. Vitals Time Taken: 8:09 AM, Height: 68 in, Weight: 294 lbs, BMI: 44.7, Temperature: 98.1 F, Pulse: 86 bpm, Respiratory Rate: 18 breaths/min, Blood Pressure: 145/59 mmHg. Bink, Kerrie HMarland Kitchen ([id number]) General Notes: Bilateral calf ulcerations improved since hospitalization. No significant cellulitis. Less necrotic tissue. Persistent 2+ pitting edema. Faintly palpable DP bilaterally. Triphasic on the right. Biphasic on the left. Noncompressible. Integumentary (Hair, Skin) Wound #1 status is Open. Original cause of wound was Gradually Appeared. The wound is located on the Right,Anterior Lower Leg. The wound measures 18.5cm length x 14.8cm width x 0.2cm depth; 215.042cm^2 area and 43.008cm^3 volume. The wound is limited to skin breakdown. There is no tunneling or undermining noted. There is a large amount of serous drainage noted. The wound margin is thickened. There is medium (34-66%) pink granulation within the wound bed. There is a medium (34-66%) amount of necrotic tissue within the wound bed including Eschar and Adherent Slough. The periwound skin appearance exhibited: Scarring, Moist. The periwound skin appearance did not exhibit: Callus, Crepitus, Excoriation, Fluctuance, Friable, Induration, Localized Edema, Rash, Dry/Scaly, Maceration, Atrophie Blanche, Cyanosis, Ecchymosis, Hemosiderin Staining, Mottled, Pallor, Rubor, Erythema.  Periwound temperature was noted as No Abnormality. The periwound has tenderness on palpation. Wound #10 status is Open. Original cause of wound was  Gradually Appeared. The wound is located on the Left Gluteus. The wound measures 3cm length x 2.5cm width x 0.1cm depth; 5.89cm^2 area and 0.589cm^3 volume. The wound is limited to skin breakdown. There is no tunneling or undermining noted. There is a medium amount of serosanguineous drainage noted. The wound margin is thickened. There is no granulation within the wound bed. There is a large (67-100%) amount of necrotic tissue within the wound bed including Eschar. The periwound skin appearance exhibited: Dry/Scaly. The periwound skin appearance did not exhibit: Callus, Crepitus, Excoriation, Fluctuance, Friable, Induration, Localized Edema, Rash, Scarring, Maceration, Moist, Atrophie Blanche, Cyanosis, Ecchymosis, Hemosiderin Staining, Mottled, Pallor, Rubor, Erythema. General Notes: lots of dry skin covering wound bed Wound #11 status is Open. Original cause of wound was Gradually Appeared. The wound is located on the Right Gluteus. The wound measures 0.3cm length x 0.6cm width x 0.1cm depth; 0.141cm^2 area and 0.014cm^3 volume. There is no tunneling or undermining noted. There is a medium amount of serosanguineous drainage noted. The wound margin is flat and intact. There is no granulation within the wound bed. There is a large (67-100%) amount of necrotic tissue within the wound bed including Eschar. The periwound skin appearance exhibited: Moist. Periwound temperature was noted as No Abnormality. The periwound has tenderness on palpation. General Notes: dry skin covering wound bed Wound #3 status is Open. Original cause of wound was Gradually Appeared. The wound is located on the Left,Lateral Lower Leg. The wound measures 15.4cm length x 9.4cm width x 0.9cm depth; 113.694cm^2 area and 102.325cm^3 volume. The wound is limited to skin breakdown. There is no tunneling or undermining noted. There is a large amount of serous drainage noted. The wound margin is flat and intact. There is medium  (34-66%) red granulation within the wound bed. There is a medium (34-66%) amount of necrotic tissue within the wound bed including Adherent Slough. The periwound skin appearance exhibited: Scarring, Moist. The periwound skin appearance did not exhibit: Callus, Crepitus, Excoriation, Fluctuance, Friable, Induration, Localized Edema, Rash, Dry/Scaly, Maceration, Atrophie Blanche, Cyanosis, Ecchymosis, Hemosiderin Staining, Mottled, Pallor, Rubor, Erythema. Periwound temperature was noted as No Abnormality. The periwound has tenderness on palpation. Wound #8 status is Open. Original cause of wound was Gradually Appeared. The wound is located on the Right,Posterior Lower Leg. The wound measures 8.8cm length x 10.2cm width x 0.3cm depth; 70.497cm^2 area and 21.149cm^3 volume. The wound is limited to skin breakdown. There is no tunneling or Kassab, Myrtis H. ([id number]) undermining noted. There is a large amount of serous drainage noted. The wound margin is flat and intact. There is medium (34-66%) red granulation within the wound bed. There is a medium (34-66%) amount of necrotic tissue within the wound bed including Eschar and Adherent Slough. The periwound skin appearance exhibited: Localized Edema, Moist. The periwound skin appearance did not exhibit: Callus, Crepitus, Excoriation, Fluctuance, Friable, Induration, Rash, Scarring, Dry/Scaly, Maceration, Atrophie Blanche, Cyanosis, Ecchymosis, Hemosiderin Staining, Mottled, Pallor, Rubor, Erythema. Periwound temperature was noted as No Abnormality. The periwound has tenderness on palpation. Wound #9 status is Open. Original cause of wound was Gradually Appeared. The wound is located on the Left,Posterior Lower Leg. The wound measures 4.5cm length x 2cm width x 0.6cm depth; 7.069cm^2 area and 4.241cm^3 volume. There is no tunneling or undermining noted. There is a large amount of serous drainage noted. The wound margin is thickened.  There is large  (67-100%) pink granulation within the wound bed. There is a small (1-33%) amount of necrotic tissue within the wound bed including Adherent Slough. The periwound skin appearance exhibited: Scarring, Moist. Periwound temperature was noted as No Abnormality. The periwound has tenderness on palpation. Assessment Active Problems ICD-10 E11.622 - Type 2 diabetes mellitus with other skin ulcer N18.6 - End stage renal disease L97.222 - Non-pressure chronic ulcer of left calf with fat layer exposed L97.212 - Non-pressure chronic ulcer of right calf with fat layer exposed R60.0 - Localized edema Chronic bilateral calf ulcerations presumably secondary to venous insufficiency. 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 There was a Skin/Subcutaneous Tissue Debridement ([id number]) debridement with total area of 273.8 sq cm performed by BURNS III, Melanie Crazier., MD. with the following instrument(s): Curette to remove Viable and Non-Viable tissue/material including Exudate, Fat, Fibrin/Slough, and Subcutaneous after achieving pain control using Other (lidocaine 4%). A time out was conducted prior to the start of the procedure. A Minimum amount of bleeding was controlled with Pressure. The procedure was tolerated well with a pain level of 0 throughout and a pain level of 0 following the procedure. Post Debridement Measurements: 18.5cm length x 14.8cm width x 0.3cm depth; 64.513cm^3 volume. Post procedure Diagnosis Wound #1: Same as Pre-Procedure Reichart, Obelia H. 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 There was a Skin/Subcutaneous Tissue Debridement ([id number]) debridement with total area of 144.76 sq cm performed by BURNS III, Melanie Crazier., MD. with the following instrument(s): Curette to remove Viable and Non-Viable tissue/material including Exudate, Fat, Fibrin/Slough, and Subcutaneous after achieving  pain control using Other (lidocaine 4%). A time out was conducted prior to the start of the procedure. A Minimum amount of bleeding was controlled with Pressure. The procedure was tolerated well with a pain level of 0 throughout and a pain level of 0 following the procedure. Post Debridement Measurements: 15.4cm length x 9.4cm width x 1cm depth; 113.694cm^3 volume. 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 There was a Skin/Subcutaneous Tissue Debridement ([id number]) debridement with total area of 89.76 sq cm performed by BURNS III, Melanie Crazier., MD. with the following instrument(s): Curette to remove Viable and Non-Viable tissue/material including Exudate, Fat, Fibrin/Slough, and Subcutaneous after achieving pain control using Other (lidocaine 4%). A time out was conducted prior to the start of the procedure. A Minimum amount of bleeding was controlled with Pressure. The procedure was tolerated well with a pain level of 0 throughout and a pain level of 0 following the procedure. Post Debridement Measurements: 8.8cm length x 10.2cm width x 0.4cm depth; 28.199cm^3 volume. Post procedure Diagnosis Wound #8: Same as Pre-Procedure Plan Wound Cleansing: Wound #1 Right,Anterior Lower Leg: Clean wound with Normal Saline. Cleanse wound with mild soap and water May Shower, gently pat wound dry prior to applying new dressing. Wound #10 Left Gluteus: Clean wound with Normal Saline. Cleanse wound with mild soap and water May Shower, gently pat wound dry prior to applying new dressing. Wound #11 Right Gluteus: Clean wound with Normal Saline. Cleanse wound with mild soap and water May Shower, gently pat wound dry prior to applying new dressing. Wound #3 Left,Lateral Lower Leg: Clean wound with Normal Saline. Cleanse wound with mild soap and water May Shower, gently pat wound dry prior to applying new  dressing. Wound #8  Right,Posterior Lower Leg: Irigoyen, Layali H. ([id number]) Clean wound with Normal Saline. Cleanse wound with mild soap and water May Shower, gently pat wound dry prior to applying new dressing. Wound #9 Left,Posterior Lower Leg: Clean wound with Normal Saline. Cleanse wound with mild soap and water May Shower, gently pat wound dry prior to applying new dressing. Anesthetic: Wound #1 Right,Anterior Lower Leg: Topical Lidocaine 4% cream applied to wound bed prior to debridement Wound #3 Left,Lateral Lower Leg: Topical Lidocaine 4% cream applied to wound bed prior to debridement Wound #8 Right,Posterior Lower Leg: Topical Lidocaine 4% cream applied to wound bed prior to debridement Wound #9 Left,Posterior Lower Leg: Topical Lidocaine 4% cream applied to wound bed prior to debridement Skin Barriers/Peri-Wound Care: Wound #10 Left Gluteus: Barrier cream Wound #11 Right Gluteus: Barrier cream Primary Wound Dressing: Wound #1 Right,Anterior Lower Leg: Aquacel Ag - Or Equivalent Wound #3 Left,Lateral Lower Leg: Aquacel Ag - Or Equivalent Wound #8 Right,Posterior Lower Leg: Aquacel Ag - Or Equivalent Wound #9 Left,Posterior Lower Leg: Aquacel Ag - Or Equivalent Secondary Dressing: Wound #1 Right,Anterior Lower Leg: ABD pad - please add ABD pad to dressing Wound #3 Left,Lateral Lower Leg: ABD pad - please add ABD pad to dressing Wound #8 Right,Posterior Lower Leg: ABD pad - please add ABD pad to dressing Wound #9 Left,Posterior Lower Leg: ABD pad - please add ABD pad to dressing Dressing Change Frequency: Wound #1 Right,Anterior Lower Leg: Change Dressing Monday, Wednesday, Friday - Wednesday in wound care center Wound #3 Left,Lateral Lower Leg: Change Dressing Monday, Wednesday, Friday - Wednesday in wound care center Wound #8 Right,Posterior Lower Leg: Change Dressing Monday, Wednesday, Friday - Wednesday in wound care center Wound #9 Left,Posterior Lower  Leg: Change Dressing Monday, Wednesday, Friday - Wednesday in wound care center Follow-up Appointments: Wound #1 Right,Anterior Lower Leg: Return Appointment in 1 week. Hueber, Kelleigh H. ([id number]) Wound #10 Left Gluteus: Return Appointment in 1 week. Wound #11 Right Gluteus: Return Appointment in 1 week. Wound #3 Left,Lateral Lower Leg: Return Appointment in 1 week. Wound #8 Right,Posterior Lower Leg: Return Appointment in 1 week. Wound #9 Left,Posterior Lower Leg: Return Appointment in 1 week. Edema Control: Wound #1 Right,Anterior Lower Leg: 3 Layer Compression System - Bilateral - Profore lite Elevate legs to the level of the heart and pump ankles as often as possible - Elevate legs at Dialysis Wound #3 Left,Lateral Lower Leg: 3 Layer Compression System - Bilateral - Profore lite Elevate legs to the level of the heart and pump ankles as often as possible - Elevate legs at Dialysis Wound #8 Right,Posterior Lower Leg: 3 Layer Compression System - Bilateral - Profore lite Elevate legs to the level of the heart and pump ankles as often as possible - Elevate legs at Dialysis Wound #9 Left,Posterior Lower Leg: 3 Layer Compression System - Bilateral - Profore lite Elevate legs to the level of the heart and pump ankles as often as possible - Elevate legs at Dialysis Off-Loading: Wound #10 Left Gluteus: Turn and reposition every 2 hours Wound #11 Right Gluteus: Turn and reposition every 2 hours Home Health: Wound #1 Right,Anterior Lower Leg: Continue Home Health Visits - [facility]: Monday, Wednesday and Friday **WRAP GOES FROM BASE OF TOES TO THREE FINGER WIDTHS BENEATH BEND IN KNEE. Home Health Nurse may visit PRN to address patient s wound care needs. FACE TO FACE ENCOUNTER: MEDICARE and MEDICAID PATIENTS: I certify that this patient is under my care and that I had a face-to-face encounter that meets  the physician face-to-face encounter requirements with this patient on this  date. The encounter with the patient was in whole or in part for the following MEDICAL CONDITION: (primary reason for Home Healthcare) MEDICAL NECESSITY: I certify, that based on my findings, NURSING services are a medically necessary home health service. HOME BOUND STATUS: I certify that my clinical findings support that this patient is homebound (i.e., Due to illness or injury, pt requires aid of supportive devices such as crutches, cane, wheelchairs, walkers, the use of special transportation or the assistance of another person to leave their place of residence. There is a normal inability to leave the home and doing so requires considerable and taxing effort. Other absences are for medical reasons / religious services and are infrequent or of short duration when for other reasons). If current dressing causes regression in wound condition, may D/C ordered dressing product/s and apply Normal Saline Moist Dressing daily until next Wound Healing Center / Other MD appointment. Notify Wound Healing Center of regression in wound condition at [phone]. Please direct any NON-WOUND related issues/requests for orders to patient's Primary Care Physician Wound #3 Left,Lateral Lower Leg: Continue Home Health Visits - [facility]: Monday, Wednesday and Friday **WRAP GOES FROM BASE OF TOES TO THREE FINGER WIDTHS BENEATH BEND IN KNEE. Home Health Nurse may visit PRN to address patient s wound care needs. KEILANI, TERRANCE ([id number]) FACE TO FACE ENCOUNTER: MEDICARE and MEDICAID PATIENTS: I certify that this patient is under my care and that I had a face-to-face encounter that meets the physician face-to-face encounter requirements with this patient on this date. The encounter with the patient was in whole or in part for the following MEDICAL CONDITION: (primary reason for Home Healthcare) MEDICAL NECESSITY: I certify, that based on my findings, NURSING services are a medically necessary home health  service. HOME BOUND STATUS: I certify that my clinical findings support that this patient is homebound (i.e., Due to illness or injury, pt requires aid of supportive devices such as crutches, cane, wheelchairs, walkers, the use of special transportation or the assistance of another person to leave their place of residence. There is a normal inability to leave the home and doing so requires considerable and taxing effort. Other absences are for medical reasons / religious services and are infrequent or of short duration when for other reasons). If current dressing causes regression in wound condition, may D/C ordered dressing product/s and apply Normal Saline Moist Dressing daily until next Wound Healing Center / Other MD appointment. Notify Wound Healing Center of regression in wound condition at [phone]. Please direct any NON-WOUND related issues/requests for orders to patient's Primary Care Physician Wound #8 Right,Posterior Lower Leg: Continue Home Health Visits - [facility]: Monday, Wednesday and Friday **WRAP GOES FROM BASE OF TOES TO THREE FINGER WIDTHS BENEATH BEND IN KNEE. Home Health Nurse may visit PRN to address patient s wound care needs. FACE TO FACE ENCOUNTER: MEDICARE and MEDICAID PATIENTS: I certify that this patient is under my care and that I had a face-to-face encounter that meets the physician face-to-face encounter requirements with this patient on this date. The encounter with the patient was in whole or in part for the following MEDICAL CONDITION: (primary reason for Home Healthcare) MEDICAL NECESSITY: I certify, that based on my findings, NURSING services are a medically necessary home health service. HOME BOUND STATUS: I certify that my clinical findings support that this patient is homebound (i.e., Due to illness or injury, pt requires aid of supportive devices such  as crutches, cane, wheelchairs, walkers, the use of special transportation or the assistance of  another person to leave their place of residence. There is a normal inability to leave the home and doing so requires considerable and taxing effort. Other absences are for medical reasons / religious services and are infrequent or of short duration when for other reasons). If current dressing causes regression in wound condition, may D/C ordered dressing product/s and apply Normal Saline Moist Dressing daily until next Wound Healing Center / Other MD appointment. Notify Wound Healing Center of regression in wound condition at [phone]. Please direct any NON-WOUND related issues/requests for orders to patient's Primary Care Physician Wound #9 Left,Posterior Lower Leg: Continue Home Health Visits - [facility]: Monday, Wednesday and Friday **WRAP GOES FROM BASE OF TOES TO THREE FINGER WIDTHS BENEATH BEND IN KNEE. Home Health Nurse may visit PRN to address patient s wound care needs. FACE TO FACE ENCOUNTER: MEDICARE and MEDICAID PATIENTS: I certify that this patient is under my care and that I had a face-to-face encounter that meets the physician face-to-face encounter requirements with this patient on this date. The encounter with the patient was in whole or in part for the following MEDICAL CONDITION: (primary reason for Home Healthcare) MEDICAL NECESSITY: I certify, that based on my findings, NURSING services are a medically necessary home health service. HOME BOUND STATUS: I certify that my clinical findings support that this patient is homebound (i.e., Due to illness or injury, pt requires aid of supportive devices such as crutches, cane, wheelchairs, walkers, the use of special transportation or the assistance of another person to leave their place of residence. There is a normal inability to leave the home and doing so requires considerable and taxing effort. Other absences are for medical reasons / religious services and are infrequent or of short duration when for other reasons). If  current dressing causes regression in wound condition, may D/C ordered dressing product/s and apply Normal Saline Moist Dressing daily until next Wound Healing Center / Other MD appointment. Notify Wound Healing Center of regression in wound condition at [phone]. Please direct any NON-WOUND related issues/requests for orders to patient's Primary Care Physician SHADI, LARNER ([id number]) Silver alginate. 3 layer compression bandages to be changed 3 times weekly. Frequent leg elevation. Venous ultrasound. Electronic Signature(s) Signed: 07/05/2015 1:01:02 PM By: Madelaine Bhat MD Entered By: Madelaine Bhat on 07/05/2015 12:36:56 Sevcik, [name] ([id number]) -------------------------------------------------------------------------------- SuperBill Details Patient Name: Dantes, [name] Date of Service: 07/05/2015 Medical Record Patient Account Number: [id number] Number: Treating RN: Huel Coventry [birth year] (59 y.o. Other Clinician: Date of Birth/Sex: Female) Treating BURNS III, Primary Care Physician/Extender: Karn Cassis Physician: Weeks in Treatment: 22 Referring Physician: Fidel Levy Diagnosis Coding ICD-10 Codes Code Description [phone] Type 2 diabetes mellitus with other skin ulcer N18.6 End stage renal disease L97.222 Non-pressure chronic ulcer of left calf with fat layer exposed L97.212 Non-pressure chronic ulcer of right calf with fat layer exposed R60.0 Localized edema Facility Procedures CPT4 Code: [id number] Description: 11042 - DEB SUBQ TISSUE 20 SQ CM/< ICD-10 Description Diagnosis L97.222 Non-pressure chronic ulcer of left calf with fat l L97.212 Non-pressure chronic ulcer of right calf with fat Modifier: ayer exposed layer exposed Quantity: 1 CPT4 Code: [id number] Description: 11045 - DEB SUBQ TISS EA ADDL 20CM ICD-10 Description Diagnosis L97.222 Non-pressure chronic ulcer of left calf with fat l L97.212 Non-pressure  chronic ulcer of right calf with fat Modifier: ayer exposed layer exposed Quantity: 25 Physician  Procedures CPT4 Code: [id number] Description: 11042 - WC PHYS SUBQ TISS 20 SQ CM ICD-10 Description Diagnosis L97.222 Non-pressure chronic ulcer of left calf with fat la L97.212 Non-pressure chronic ulcer of right calf with fat l Modifier: yer exposed ayer exposed Quantity: 1 CPT4 Code: [id number] Mckillop, PAM Description: 11045 - WC PHYS SUBQ TISS EA ADDL 20 CM Description ELA H. ([id number]) Modifier: Quantity: 25 Electronic Signature(s) Signed: 07/05/2015 1:01:02 PM By: Madelaine BhatBurns, III, Walter MD Entered By: Madelaine BhatBurns, III, Walter on 07/05/2015 12:37:20

## 2015-07-07 DIAGNOSIS — N186 End stage renal disease: Secondary | ICD-10-CM | POA: Diagnosis not present

## 2015-07-07 DIAGNOSIS — Z992 Dependence on renal dialysis: Secondary | ICD-10-CM | POA: Diagnosis not present

## 2015-07-07 DIAGNOSIS — D631 Anemia in chronic kidney disease: Secondary | ICD-10-CM | POA: Diagnosis not present

## 2015-07-07 DIAGNOSIS — N2581 Secondary hyperparathyroidism of renal origin: Secondary | ICD-10-CM | POA: Diagnosis not present

## 2015-07-12 ENCOUNTER — Encounter: Payer: BC Managed Care – PPO | Admitting: Surgery

## 2015-07-12 DIAGNOSIS — L97212 Non-pressure chronic ulcer of right calf with fat layer exposed: Secondary | ICD-10-CM | POA: Diagnosis not present

## 2015-07-12 DIAGNOSIS — L97222 Non-pressure chronic ulcer of left calf with fat layer exposed: Secondary | ICD-10-CM | POA: Diagnosis not present

## 2015-07-12 DIAGNOSIS — E11622 Type 2 diabetes mellitus with other skin ulcer: Secondary | ICD-10-CM | POA: Diagnosis not present

## 2015-07-12 DIAGNOSIS — D631 Anemia in chronic kidney disease: Secondary | ICD-10-CM | POA: Diagnosis not present

## 2015-07-12 DIAGNOSIS — N186 End stage renal disease: Secondary | ICD-10-CM | POA: Diagnosis not present

## 2015-07-12 DIAGNOSIS — D509 Iron deficiency anemia, unspecified: Secondary | ICD-10-CM | POA: Diagnosis not present

## 2015-07-12 DIAGNOSIS — Z992 Dependence on renal dialysis: Secondary | ICD-10-CM | POA: Diagnosis not present

## 2015-07-12 DIAGNOSIS — N2581 Secondary hyperparathyroidism of renal origin: Secondary | ICD-10-CM | POA: Diagnosis not present

## 2015-07-12 DIAGNOSIS — R600 Localized edema: Secondary | ICD-10-CM | POA: Diagnosis not present

## 2015-07-13 NOTE — Progress Notes (Signed)
[name], [name] ([id number]) Visit Report for 07/12/2015 Arrival Information Details Patient Name: [name], [name] Date of Service: 07/12/2015 8:00 AM Medical Record Patient Account Number: [id number] Number: Treating RN: Phillis Haggisinkerton, Debi Jul 21, 1955 (59 y.o. Other Clinician: Date of Birth/Sex: Female) Treating BURNS III, Primary Care Physician: Fidel LevyHAWKINS JR, [name] Physician/Extender: Zollie BeckersWALTER Referring Physician: Jones BroomHAWKINS JR, [name] Weeks in Treatment: 23 Visit Information History Since Last Visit All ordered tests and consults were completed: No Patient Arrived: Ambulatory Added or deleted any medications: No Arrival Time: 08:01 Any new allergies or adverse reactions: No Accompanied By: self Had a fall or experienced change in No Transfer Assistance: None activities of daily living that may affect Patient Identification Verified: Yes risk of falls: Secondary Verification Process Yes Signs or symptoms of abuse/neglect since last No Completed: visito Patient Requires Transmission- No Hospitalized since last visit: No Based Precautions: Pain Present Now: No Patient Has Alerts: Yes Patient Alerts: DMII ABI [place] Bilateral Electronic Signature(s) Signed: 07/12/2015 5:26:27 PM By: Alejandro MullingPinkerton, Debra Entered By: Alejandro MullingPinkerton, Debra on 07/12/2015 08:03:10 [name], [name] ([id number]) -------------------------------------------------------------------------------- Encounter Discharge Information Details Patient Name: [name], [name] Date of Service: 07/12/2015 8:00 AM Medical Record Patient Account Number: [id number] Number: Treating RN: Phillis Haggisinkerton, Debi Jul 21, 1955 (59 y.o. Other Clinician: Date of Birth/Sex: Female) Treating BURNS III, Primary Care Physician: Fidel LevyHAWKINS JR, [name] Physician/Extender: Zollie BeckersWALTER Referring Physician: Jones BroomHAWKINS JR, [name] Weeks in Treatment: 4223 Encounter Discharge Information Items Discharge Pain Level: 0 Discharge Condition:  Stable Ambulatory Status: Ambulatory Discharge Destination: Home Private Transportation: Auto Accompanied By: self Schedule Follow-up Appointment: Yes Medication Reconciliation completed and Yes provided to Patient/Care Reiley Bertagnolli: Clinical Summary of Care: Electronic Signature(s) Signed: 07/12/2015 5:26:27 PM By: Alejandro MullingPinkerton, Debra Entered By: Alejandro MullingPinkerton, Debra on 07/12/2015 08:49:54 [name], [name] ([id number]) -------------------------------------------------------------------------------- Lower Extremity Assessment Details Patient Name: Dingledine, Kaytlan H. Date of Service: 07/12/2015 8:00 AM Medical Record Patient Account Number: [id number] Number: Treating RN: Phillis Haggisinkerton, Debi Jul 21, 1955 (59 y.o. Other Clinician: Date of Birth/Sex: Female) Treating BURNS III, Primary Care Physician: Fidel LevyHAWKINS JR, [name] Physician/Extender: Zollie BeckersWALTER Referring Physician: Jones BroomHAWKINS JR, [name] Weeks in Treatment: 23 Edema Assessment Assessed: [Left: No] [Right: No] E[Left: dema] [Right: :] Calf Left: Right: Point of Measurement: cm From Medial Instep 43.5 cm 42.2 cm Ankle Left: Right: Point of Measurement: cm From Medial Instep 25.3 cm 24 cm Vascular Assessment Pulses: Posterior Tibial Dorsalis Pedis Palpable: [Left:Yes] [Right:Yes] Extremity colors, hair growth, and conditions: Hair Growth on Extremity: [Right:No] Temperature of Extremity: [Left:Warm] [Right:Warm] Capillary Refill: [Left:< 3 seconds] [Right:< 3 seconds] Toe Nail Assessment Left: Right: Thick: No No Discolored: No No Deformed: No No Improper Length and Hygiene: No No Electronic Signature(s) Signed: 07/12/2015 5:26:27 PM By: Alejandro MullingPinkerton, Debra Entered By: Alejandro MullingPinkerton, Debra on 07/12/2015 08:22:21 [name], [name] ([id number]) [name], [name] ([id number]) -------------------------------------------------------------------------------- Multi Wound Chart Details Patient Name: [name], [name] Date of  Service: 07/12/2015 8:00 AM Medical Record Patient Account Number: [id number] Number: Treating RN: Phillis Haggisinkerton, Debi Jul 21, 1955 (59 y.o. Other Clinician: Date of Birth/Sex: Female) Treating BURNS III, Primary Care Physician: Fidel LevyHAWKINS JR, [name] Physician/Extender: Zollie BeckersWALTER Referring Physician: Jones BroomHAWKINS JR, [name] Weeks in Treatment: 23 Vital Signs Height(in): 68 Pulse(bpm): 96 Weight(lbs): 294 Blood Pressure 137/82 (mmHg): Body Mass Index(BMI): 45 Temperature(F): 98.1 Respiratory Rate 18 (breaths/min): Photos: [1:No Photos] [10:No Photos] [11:No Photos] Wound Location: [1:Right Lower Leg - Anterior Left Gluteus] [11:Right Gluteus] Wounding Event: [1:Gradually Appeared] [10:Gradually Appeared] [11:Gradually Appeared] Primary Etiology: [1:Venous Leg Ulcer] [10:Pressure Ulcer] [11:Pressure Ulcer] Comorbid History: [1:Hypertension, Type II Diabetes] [10:Hypertension, Type II Diabetes] [  11:Hypertension, Type II Diabetes] Date Acquired: [1:01/09/2015] [10:05/31/2015] [11:05/31/2015] Weeks of Treatment: [1:23] [10:5] [11:5] Wound Status: [1:Open] [10:Open] [11:Open] Measurements L x W x D 18x16x0.2 [10:3x2.5x0.1] [11:0.3x0.6x0.1] (cm) Area (cm) : [1:226.195] [10:5.89] [11:0.141] Volume (cm) : [1:45.239] [10:0.589] [11:0.014] % Reduction in Area: [1:-481166.00%] [10:37.50%] [11:82.00%] % Reduction in Volume: -904680.00% [10:37.50%] [11:91.10%] Classification: [1:Full Thickness Without Exposed Support Structures] [10:Category/Stage II] [11:Category/Stage II] HBO Classification: [1:Grade 2] [10:N/A] [11:N/A] Exudate Amount: [1:Large] [10:Medium] [11:Medium] Exudate Type: [1:Serous] [10:Serosanguineous] [11:Serosanguineous] Exudate Color: [1:amber] [10:red, brown] [11:red, brown] Wound Margin: [1:Thickened] [10:Thickened] [11:Flat and Intact] Granulation Amount: [1:Small (1-33%)] [10:None Present (0%)] [11:None Present (0%)] Granulation Quality: [1:Pink] [10:N/A]  [11:N/A] Necrotic Amount: [1:Large (67-100%)] [10:Large (67-100%)] [11:Large (67-100%)] Necrotic Tissue: [1:Eschar, Adherent Slough Eschar] [11:Eschar] Exposed Structures: [11:N/A] Fascia: No Fascia: No Fat: No Fat: No Tendon: No Tendon: No Muscle: No Muscle: No Joint: No Joint: No Bone: No Bone: No Limited to Skin Limited to Skin Breakdown Breakdown Epithelialization: None None None Periwound Skin Texture: Scarring: Yes Edema: No No Abnormalities Noted Edema: No Excoriation: No Excoriation: No Induration: No Induration: No Callus: No Callus: No Crepitus: No Crepitus: No Fluctuance: No Fluctuance: No Friable: No Friable: No Rash: No Rash: No Scarring: No Periwound Skin Moist: Yes Dry/Scaly: Yes Moist: Yes Moisture: Maceration: No Maceration: No Dry/Scaly: No Moist: No Periwound Skin Color: Atrophie Blanche: No Atrophie Blanche: No No Abnormalities Noted Cyanosis: No Cyanosis: No Ecchymosis: No Ecchymosis: No Erythema: No Erythema: No Hemosiderin Staining: No Hemosiderin Staining: No Mottled: No Mottled: No Pallor: No Pallor: No Rubor: No Rubor: No Temperature: No Abnormality N/A No Abnormality Tenderness on Yes No Yes Palpation: Wound Preparation: Ulcer Cleansing: Ulcer Cleansing: Other: Ulcer Cleansing: Rinsed/Irrigated with soap and water Rinsed/Irrigated with Saline Saline Topical Anesthetic Topical Anesthetic Applied: None Topical Anesthetic Applied: Other: lidocaine Applied: None 4% Wound Number: Photos: No Photos No Photos No Photos Wound Location: Left Lower Leg - Lateral Right Lower Leg - Left Lower Leg - Posterior Posterior Wounding Event: Gradually Appeared Gradually Appeared Gradually Appeared Primary Etiology: Calciphylaxis Calciphylaxis Calciphylaxis Comorbid History: Hypertension, Type II Hypertension, Type II Hypertension, Type II Diabetes Diabetes Diabetes Date Acquired: 01/09/2015 03/27/2015 03/13/2015 Weeks of  Treatment: [name], [name] ([id number]) Wound Status: Open Open Open Measurements L x W x D 15.5x9x0.7 10x6.5x0.3 3.5x1.3x0.2 (cm) Area (cm) : 109.563 51.051 3.574 Volume (cm) : 76.694 15.315 0.715 % Reduction in Area: -896.40% -2789.10% 77.20% % Reduction in Volume: -6872.20% -8552.50% 77.20% Classification: Full Thickness Without Full Thickness Without Full Thickness Without Exposed Support Exposed Support Exposed Support Structures Structures Structures HBO Classification: Grade 1 Grade 1 Grade 2 Exudate Amount: Large Large Large Exudate Type: Serous Serous Serous Exudate Color: amber amber amber Wound Margin: Flat and Intact Flat and Intact Thickened Granulation Amount: Medium (34-66%) Medium (34-66%) Large (67-100%) Granulation Quality: Red Red, Hyper-granulation Pink Necrotic Amount: Medium (34-66%) Medium (34-66%) Small (1-33%) Necrotic Tissue: Adherent Careers information officer, Adherent Slough Adherent Slough Exposed Structures: Fascia: No Fascia: No N/A Fat: No Fat: No Tendon: No Tendon: No Muscle: No Muscle: No Joint: No Joint: No Bone: No Bone: No Limited to Skin Limited to Skin Breakdown Breakdown Epithelialization: None None None Periwound Skin Texture: Scarring: Yes Edema: Yes Scarring: Yes Edema: No Excoriation: No Excoriation: No Induration: No Induration: No Callus: No Callus: No Crepitus: No Crepitus: No Fluctuance: No Fluctuance: No Friable: No Friable: No Rash: No Rash: No Scarring: No Periwound Skin Moist: Yes Moist: Yes Moist: Yes Moisture: Maceration: No Maceration: No  Dry/Scaly: No Dry/Scaly: No Periwound Skin Color: Atrophie Blanche: No Atrophie Blanche: No No Abnormalities Noted Cyanosis: No Cyanosis: No Ecchymosis: No Ecchymosis: No Erythema: No Erythema: No Hemosiderin Staining: No Hemosiderin Staining: No Mottled: No Mottled: No Pallor: No Pallor: No Rubor: No Rubor: No Temperature: No Abnormality No  Abnormality No Abnormality Yes Yes Yes Lizana, Redonna H. ([id number]) Tenderness on Palpation: Wound Preparation: Ulcer Cleansing: Other: Ulcer Cleansing: Other: Ulcer Cleansing: soap and water soap and water Rinsed/Irrigated with Saline Topical Anesthetic Topical Anesthetic Applied: Other: lidocaine Applied: Other: lidocaine Topical Anesthetic 4% 4% Applied: Other: lidocaine 4% Treatment Notes Electronic Signature(s) Signed: 07/12/2015 5:26:27 PM By: Alejandro Mulling Entered By: Alejandro Mulling on 07/12/2015 08:43:53 Winkleman, [name] ([id number]) -------------------------------------------------------------------------------- Multi-Disciplinary Care Plan Details Patient Name: [name], [name] Date of Service: 07/12/2015 8:00 AM Medical Record Patient Account Number: [id number] Number: Treating RN: Phillis Haggis 09/23/1955 (59 y.o. Other Clinician: Date of Birth/Sex: Female) Treating BURNS III, Primary Care Physician: Fidel Levy Physician/Extender: Zollie Beckers Referring Physician: Jones Broom in Treatment: 77 Active Inactive Orientation to the Wound Care Program Nursing Diagnoses: Knowledge deficit related to the wound healing center program Goals: Patient/caregiver will verbalize understanding of the Wound Healing Center Program Date Initiated: 01/30/2015 Goal Status: Active Interventions: Provide education on orientation to the wound center Notes: Pain, Acute or Chronic Nursing Diagnoses: Pain, acute or chronic: actual or potential Goals: Patient will verbalize adequate pain control and receive pain control interventions during procedures as needed Date Initiated: 01/30/2015 Goal Status: Active Patient/caregiver will verbalize adequate pain control between visits Date Initiated: 01/30/2015 Goal Status: Active Interventions: Assess comfort goal upon admission Encourage patient to take pain medications as  prescribed Notes: Venous Leg Ulcer [name], [name] ([id number]) Nursing Diagnoses: Potential for venous Insuffiency (use before diagnosis confirmed) Goals: Non-invasive venous studies are completed as ordered Date Initiated: 01/30/2015 Goal Status: Active Interventions: Assess peripheral edema status every visit. Notes: Wound/Skin Impairment Nursing Diagnoses: Impaired tissue integrity Goals: Ulcer/skin breakdown will have a volume reduction of 30% by week 4 Date Initiated: 01/30/2015 Goal Status: Active Ulcer/skin breakdown will have a volume reduction of 50% by week 8 Date Initiated: 01/30/2015 Goal Status: Active Ulcer/skin breakdown will have a volume reduction of 80% by week 12 Date Initiated: 01/30/2015 Goal Status: Active Ulcer/skin breakdown will heal within 14 weeks Date Initiated: 01/30/2015 Goal Status: Active Interventions: Assess ulceration(s) every visit Notes: Electronic Signature(s) Signed: 07/12/2015 5:26:27 PM By: Alejandro Mulling Entered By: Alejandro Mulling on 07/12/2015 08:43:43 Ventresca, Rebbie H. ([id number]) -------------------------------------------------------------------------------- Pain Assessment Details Patient Name: Sjogren, Cashae H. Date of Service: 07/12/2015 8:00 AM Medical Record Patient Account Number: [id number] Number: Treating RN: Phillis Haggis 22-Sep-1955 (59 y.o. Other Clinician: Date of Birth/Sex: Female) Treating BURNS III, Primary Care Physician: Fidel Levy Physician/Extender: Zollie Beckers Referring Physician: Jones Broom in Treatment: 23 Active Problems Location of Pain Severity and Description of Pain Patient Has Paino No Site Locations Pain Management and Medication Current Pain Management: Electronic Signature(s) Signed: 07/12/2015 5:26:27 PM By: Alejandro Mulling Entered By: Alejandro Mulling on 07/12/2015 08:03:18 Byrum, [name]  ([id number]) -------------------------------------------------------------------------------- Patient/Caregiver Education Details Patient Name: Cromartie, Lovena H. Date of Service: 07/12/2015 8:00 AM Medical Record Patient Account Number: [id number] Number: Treating RN: Phillis Haggis 08-27-1955 (59 y.o. Other Clinician: Date of Birth/Gender: Female) Treating BURNS III, Primary Care Physician: Fidel Levy Physician/Extender: Zollie Beckers Referring Physician: Jones Broom in Treatment: 19 Education Assessment Education Provided To: Patient Education Topics Provided Wound/Skin Impairment: Handouts: Other:  change dressing as ordered, do not get dressings wet Methods: Demonstration, Explain/Verbal Responses: State content correctly Electronic Signature(s) Signed: 07/12/2015 5:26:27 PM By: Alejandro Mulling Entered By: Alejandro Mulling on 07/12/2015 08:50:21 Stein, Jacquelynne H. ([id number]) -------------------------------------------------------------------------------- Wound Assessment Details Patient Name: Hauschild, Adamaris H. Date of Service: 07/12/2015 8:00 AM Medical Record Patient Account Number: [id number] Number: Treating RN: Phillis Haggis July 26, 1955 (59 y.o. Other Clinician: Date of Birth/Sex: Female) Treating BURNS III, Primary Care Physician: Fidel Levy Physician/Extender: Zollie Beckers Referring Physician: Jones Broom in Treatment: 23 Wound Status Wound Number: 1 Primary Etiology: Venous Leg Ulcer Wound Location: Right Lower Leg - Anterior Wound Status: Open Wounding Event: Gradually Appeared Comorbid History: Hypertension, Type II Diabetes Date Acquired: 01/09/2015 Weeks Of Treatment: 23 Clustered Wound: No Photos Photo Uploaded By: Alejandro Mulling on 07/12/2015 16:45:52 Wound Measurements Length: (cm) 18 Width: (cm) 16 Depth: (cm) 0.2 Area: (cm) 226.195 Volume: (cm) 45.239 % Reduction in Area:  -481166% % Reduction in Volume: -904680% Epithelialization: None Tunneling: No Undermining: No Wound Description Full Thickness Without Classification: Exposed Support Structures Diabetic Severity Grade 2 (Wagner): Wound Margin: Thickened Exudate Amount: Large Exudate Type: Serous Exudate Color: amber Foul Odor After Cleansing: No Wound Bed Lague, Rhya H. ([id number]) Granulation Amount: Small (1-33%) Exposed Structure Granulation Quality: Pink Fascia Exposed: No Necrotic Amount: Large (67-100%) Fat Layer Exposed: No Necrotic Quality: Eschar, Adherent Slough Tendon Exposed: No Muscle Exposed: No Joint Exposed: No Bone Exposed: No Limited to Skin Breakdown Periwound Skin Texture Texture Color No Abnormalities Noted: No No Abnormalities Noted: No Callus: No Atrophie Blanche: No Crepitus: No Cyanosis: No Excoriation: No Ecchymosis: No Fluctuance: No Erythema: No Friable: No Hemosiderin Staining: No Induration: No Mottled: No Localized Edema: No Pallor: No Rash: No Rubor: No Scarring: Yes Temperature / Pain Moisture Temperature: No Abnormality No Abnormalities Noted: No Tenderness on Palpation: Yes Dry / Scaly: No Maceration: No Moist: Yes Wound Preparation Ulcer Cleansing: Rinsed/Irrigated with Saline Topical Anesthetic Applied: Other: lidocaine 4%, Treatment Notes Wound #1 (Right, Anterior Lower Leg) 1. Cleansed with: Cleanse wound with antibacterial soap and water 2. Anesthetic Topical Lidocaine 4% cream to wound bed prior to debridement 4. Dressing Applied: Aquacel Ag 5. Secondary Dressing Applied ABD Pad 7. Secured with 3 Layer Compression System - Bilateral Electronic Signature(s) Signed: 07/12/2015 5:26:27 PM By: Almedia Balls, Deanie Rexene [name] ([id number]) Entered By: Alejandro Mulling on 07/12/2015 08:24:51 Buchmann, Autymn Rexene [name] ([id number]) -------------------------------------------------------------------------------- Wound  Assessment Details Patient Name: Soman, Helena H. Date of Service: 07/12/2015 8:00 AM Medical Record Patient Account Number: [id number] Number: Treating RN: Phillis Haggis Jan 08, 1956 (59 y.o. Other Clinician: Date of Birth/Sex: Female) Treating BURNS III, Primary Care Physician: Fidel Levy Physician/Extender: Zollie Beckers Referring Physician: Jones Broom in Treatment: 23 Wound Status Wound Number: 10 Primary Etiology: Pressure Ulcer Wound Location: Left Gluteus Wound Status: Open Wounding Event: Gradually Appeared Comorbid History: Hypertension, Type II Diabetes Date Acquired: 05/31/2015 Weeks Of Treatment: 5 Clustered Wound: No Photos Photo Uploaded By: Alejandro Mulling on 07/12/2015 16:45:52 Wound Measurements Length: (cm) 3 Width: (cm) 2.5 Depth: (cm) 0.1 Area: (cm) 5.89 Volume: (cm) 0.589 % Reduction in Area: 37.5% % Reduction in Volume: 37.5% Epithelialization: None Tunneling: No Undermining: No Wound Description Classification: Category/Stage II Wound Margin: Thickened Exudate Amount: Medium Exudate Type: Serosanguineous Exudate Color: red, brown Foul Odor After Cleansing: No Wound Bed Granulation Amount: None Present (0%) Exposed Structure Necrotic Amount: Large (67-100%) Fascia Exposed: No Necrotic Quality: Eschar Fat Layer Exposed: No Worthing, Kewanda H. ([id number]) Tendon Exposed:  No Muscle Exposed: No Joint Exposed: No Bone Exposed: No Limited to Skin Breakdown Periwound Skin Texture Texture Color No Abnormalities Noted: No No Abnormalities Noted: No Callus: No Atrophie Blanche: No Crepitus: No Cyanosis: No Excoriation: No Ecchymosis: No Fluctuance: No Erythema: No Friable: No Hemosiderin Staining: No Induration: No Mottled: No Localized Edema: No Pallor: No Rash: No Rubor: No Scarring: No Moisture No Abnormalities Noted: No Dry / Scaly: Yes Maceration: No Moist: No Wound Preparation Ulcer  Cleansing: Other: soap and water, Topical Anesthetic Applied: None Treatment Notes Wound #10 (Left Gluteus) 1. Cleansed with: Cleanse wound with antibacterial soap and water 2. Anesthetic Topical Lidocaine 4% cream to wound bed prior to debridement 4. Dressing Applied: Aquacel Ag 5. Secondary Dressing Applied ABD Pad 7. Secured with 3 Layer Compression System - Bilateral Electronic Signature(s) Signed: 07/12/2015 5:26:27 PM By: Alejandro Mulling Entered By: Alejandro Mulling on 07/12/2015 08:33:13 [name], [place] Rexene [name] ([id number]) -------------------------------------------------------------------------------- Wound Assessment Details Patient Name: Sutch, Demitria H. Date of Service: 07/12/2015 8:00 AM Medical Record Patient Account Number: [id number] Number: Treating RN: Phillis Haggis 1956/06/13 (59 y.o. Other Clinician: Date of Birth/Sex: Female) Treating BURNS III, Primary Care Physician: Fidel Levy Physician/Extender: Zollie Beckers Referring Physician: Jones Broom in Treatment: 23 Wound Status Wound Number: 11 Primary Etiology: Pressure Ulcer Wound Location: Right Gluteus Wound Status: Open Wounding Event: Gradually Appeared Comorbid History: Hypertension, Type II Diabetes Date Acquired: 05/31/2015 Weeks Of Treatment: 5 Clustered Wound: No Photos Photo Uploaded By: Alejandro Mulling on 07/12/2015 16:45:29 Wound Measurements Length: (cm) 0.3 Width: (cm) 0.6 Depth: (cm) 0.1 Area: (cm) 0.141 Volume: (cm) 0.014 % Reduction in Area: 82% % Reduction in Volume: 91.1% Epithelialization: None Tunneling: No Undermining: No Wound Description Classification: Category/Stage II Wound Margin: Flat and Intact Exudate Amount: Medium Exudate Type: Serosanguineous Exudate Color: red, brown Foul Odor After Cleansing: No Wound Bed Granulation Amount: None Present (0%) Necrotic Amount: Large (67-100%) Necrotic Quality: Eschar Somoza, Khaya  H. ([id number]) Periwound Skin Texture Texture Color No Abnormalities Noted: No No Abnormalities Noted: No Moisture Temperature / Pain No Abnormalities Noted: No Temperature: No Abnormality Moist: Yes Tenderness on Palpation: Yes Wound Preparation Ulcer Cleansing: Rinsed/Irrigated with Saline Topical Anesthetic Applied: None Treatment Notes Wound #11 (Right Gluteus) 1. Cleansed with: Cleanse wound with antibacterial soap and water 2. Anesthetic Topical Lidocaine 4% cream to wound bed prior to debridement 4. Dressing Applied: Aquacel Ag 5. Secondary Dressing Applied ABD Pad 7. Secured with 3 Layer Compression System - Bilateral Electronic Signature(s) Signed: 07/12/2015 5:26:27 PM By: Alejandro Mulling Entered By: Alejandro Mulling on 07/12/2015 08:34:18 Mccollom, Rikayla Rexene [name] ([id number]) -------------------------------------------------------------------------------- Wound Assessment Details Patient Name: Weathington, Harris H. Date of Service: 07/12/2015 8:00 AM Medical Record Patient Account Number: [id number] Number: Treating RN: Phillis Haggis [birth year] (59 y.o. Other Clinician: Date of Birth/Sex: Female) Treating BURNS III, Primary Care Physician: Fidel Levy Physician/Extender: Zollie Beckers Referring Physician: Jones Broom in Treatment: 23 Wound Status Wound Number: 3 Primary Etiology: Calciphylaxis Wound Location: Left Lower Leg - Lateral Wound Status: Open Wounding Event: Gradually Appeared Comorbid History: Hypertension, Type II Diabetes Date Acquired: 01/09/2015 Weeks Of Treatment: 23 Clustered Wound: No Photos Photo Uploaded By: Alejandro Mulling on 07/12/2015 16:45:29 Wound Measurements Length: (cm) 15.5 Width: (cm) 9 Depth: (cm) 0.7 Area: (cm) 109.563 Volume: (cm) 76.694 % Reduction in Area: -896.4% % Reduction in Volume: -6872.2% Epithelialization: None Tunneling: No Undermining: No Wound Description Full Thickness  Without Exposed Classification: Support Structures Diabetic Severity Grade 1 (Wagner): Wound Margin: Flat and  Intact Exudate Amount: Large Exudate Type: Serous Exudate Color: amber Wound Bed Duer, Terrisa H. ([id number]) Granulation Amount: Medium (34-66%) Exposed Structure Granulation Quality: Red Fascia Exposed: No Necrotic Amount: Medium (34-66%) Fat Layer Exposed: No Necrotic Quality: Adherent Slough Tendon Exposed: No Muscle Exposed: No Joint Exposed: No Bone Exposed: No Limited to Skin Breakdown Periwound Skin Texture Texture Color No Abnormalities Noted: No No Abnormalities Noted: No Callus: No Atrophie Blanche: No Crepitus: No Cyanosis: No Excoriation: No Ecchymosis: No Fluctuance: No Erythema: No Friable: No Hemosiderin Staining: No Induration: No Mottled: No Localized Edema: No Pallor: No Rash: No Rubor: No Scarring: Yes Temperature / Pain Moisture Temperature: No Abnormality No Abnormalities Noted: No Tenderness on Palpation: Yes Dry / Scaly: No Maceration: No Moist: Yes Wound Preparation Ulcer Cleansing: Other: soap and water, Topical Anesthetic Applied: Other: lidocaine 4%, Treatment Notes Wound #3 (Left, Lateral Lower Leg) 1. Cleansed with: Cleanse wound with antibacterial soap and water 2. Anesthetic Topical Lidocaine 4% cream to wound bed prior to debridement 4. Dressing Applied: Aquacel Ag 5. Secondary Dressing Applied ABD Pad 7. Secured with 3 Layer Compression System - Bilateral Electronic Signature(s) Signed: 07/12/2015 5:26:27 PM By: Almedia Balls, Analisia Rexene [name] ([id number]) Entered By: Alejandro Mulling on 07/12/2015 08:27:04 [place], Aneita Rexene [name] ([id number]) -------------------------------------------------------------------------------- Wound Assessment Details Patient Name: Bruster, Noell H. Date of Service: 07/12/2015 8:00 AM Medical Record Patient Account Number: [id number] Number: Treating RN:  Phillis Haggis [birth year] (59 y.o. Other Clinician: Date of Birth/Sex: Female) Treating BURNS III, Primary Care Physician: Fidel Levy Physician/Extender: Zollie Beckers Referring Physician: Jones Broom in Treatment: 23 Wound Status Wound Number: 8 Primary Etiology: Calciphylaxis Wound Location: Right Lower Leg - Posterior Wound Status: Open Wounding Event: Gradually Appeared Comorbid History: Hypertension, Type II Diabetes Date Acquired: 03/27/2015 Weeks Of Treatment: 14 Clustered Wound: No Photos Photo Uploaded By: Alejandro Mulling on 07/12/2015 16:47:36 Wound Measurements Length: (cm) 10 Width: (cm) 6.5 Depth: (cm) 0.3 Area: (cm) 51.051 Volume: (cm) 15.315 % Reduction in Area: -2789.1% % Reduction in Volume: -8552.5% Epithelialization: None Tunneling: No Undermining: No Wound Description Full Thickness Without Classification: Exposed Support Structures Diabetic Severity Grade 1 (Wagner): Wound Margin: Flat and Intact Exudate Amount: Large Exudate Type: Serous Exudate Color: amber Foul Odor After Cleansing: No Wound Bed Bitner, Ingri H. ([id number]) Granulation Amount: Medium (34-66%) Exposed Structure Granulation Quality: Red, Hyper-granulation Fascia Exposed: No Necrotic Amount: Medium (34-66%) Fat Layer Exposed: No Necrotic Quality: Eschar, Adherent Slough Tendon Exposed: No Muscle Exposed: No Joint Exposed: No Bone Exposed: No Limited to Skin Breakdown Periwound Skin Texture Texture Color No Abnormalities Noted: No No Abnormalities Noted: No Callus: No Atrophie Blanche: No Crepitus: No Cyanosis: No Excoriation: No Ecchymosis: No Fluctuance: No Erythema: No Friable: No Hemosiderin Staining: No Induration: No Mottled: No Localized Edema: Yes Pallor: No Rash: No Rubor: No Scarring: No Temperature / Pain Moisture Temperature: No Abnormality No Abnormalities Noted: No Tenderness on Palpation: Yes Dry / Scaly:  No Maceration: No Moist: Yes Wound Preparation Ulcer Cleansing: Other: soap and water, Topical Anesthetic Applied: Other: lidocaine 4%, Treatment Notes Wound #8 (Right, Posterior Lower Leg) 1. Cleansed with: Cleanse wound with antibacterial soap and water 2. Anesthetic Topical Lidocaine 4% cream to wound bed prior to debridement 4. Dressing Applied: Aquacel Ag 5. Secondary Dressing Applied ABD Pad 7. Secured with 3 Layer Compression System - Bilateral Electronic Signature(s) Signed: 07/12/2015 5:26:27 PM By: Almedia Balls, Meleane Rexene [name] ([id number]) Entered By: Alejandro Mulling on 07/12/2015 08:31:00 Humes, Melania H. ([id number]) -------------------------------------------------------------------------------- Wound Assessment  Details Patient Name: SIMARA, RHYNER. Date of Service: 07/12/2015 8:00 AM Medical Record Patient Account Number: [id number] Number: Treating RN: Phillis Haggis Mar 23, 1956 (59 y.o. Other Clinician: Date of Birth/Sex: Female) Treating BURNS III, Primary Care Physician: Fidel Levy Physician/Extender: Zollie Beckers Referring Physician: Jones Broom in Treatment: 23 Wound Status Wound Number: 9 Primary Etiology: Calciphylaxis Wound Location: Left Lower Leg - Posterior Wound Status: Open Wounding Event: Gradually Appeared Comorbid History: Hypertension, Type II Diabetes Date Acquired: 03/13/2015 Weeks Of Treatment: 11 Clustered Wound: No Photos Photo Uploaded By: Alejandro Mulling on 07/12/2015 16:47:46 Wound Measurements Length: (cm) 3.5 Width: (cm) 1.3 Depth: (cm) 0.2 Area: (cm) 3.574 Volume: (cm) 0.715 % Reduction in Area: 77.2% % Reduction in Volume: 77.2% Epithelialization: None Tunneling: No Undermining: No Wound Description Full Thickness Without Classification: Exposed Support Structures Diabetic Severity Grade 2 (Wagner): Wound Margin: Thickened Exudate Amount: Large Exudate Type:  Serous Exudate Color: amber Foul Odor After Cleansing: No Wound Bed Benkert, Keamber H. ([id number]) Granulation Amount: Large (67-100%) Granulation Quality: Pink Necrotic Amount: Small (1-33%) Necrotic Quality: Adherent Slough Periwound Skin Texture Texture Color No Abnormalities Noted: No No Abnormalities Noted: No Scarring: Yes Temperature / Pain Moisture Temperature: No Abnormality No Abnormalities Noted: No Tenderness on Palpation: Yes Moist: Yes Wound Preparation Ulcer Cleansing: Rinsed/Irrigated with Saline Topical Anesthetic Applied: Other: lidocaine 4%, Treatment Notes Wound #9 (Left, Posterior Lower Leg) 1. Cleansed with: Cleanse wound with antibacterial soap and water 2. Anesthetic Topical Lidocaine 4% cream to wound bed prior to debridement 4. Dressing Applied: Aquacel Ag 5. Secondary Dressing Applied ABD Pad 7. Secured with 3 Layer Compression System - Bilateral Electronic Signature(s) Signed: 07/12/2015 5:26:27 PM By: Alejandro Mulling Entered By: Alejandro Mulling on 07/12/2015 08:29:14 [place]craft, Leara Rexene [name] ([id number]) -------------------------------------------------------------------------------- Vitals Details Patient Name: Frei, Zyanna H. Date of Service: 07/12/2015 8:00 AM Medical Record Patient Account Number: [id number] Number: Treating RN: Phillis Haggis 27-Apr-1956 (59 y.o. Other Clinician: Date of Birth/Sex: Female) Treating BURNS III, Primary Care Physician: Fidel Levy Physician/Extender: Zollie Beckers Referring Physician: Jones Broom in Treatment: 23 Vital Signs Time Taken: 08:03 Temperature (F): 98.1 Height (in): 68 Pulse (bpm): 96 Weight (lbs): 294 Respiratory Rate (breaths/min): 18 Body Mass Index (BMI): 44.7 Blood Pressure (mmHg): 137/82 Reference Range: 80 - 120 mg / dl Electronic Signature(s) Signed: 07/12/2015 5:26:27 PM By: Alejandro Mulling Entered By: Alejandro Mulling on 07/12/2015 08:05:15

## 2015-07-13 NOTE — Progress Notes (Signed)
[name], [name] ([id number]) Visit Report for 07/12/2015 Chief Complaint Document Details Patient Name: [name], [name]. Date of Service: 07/12/2015 8:00 AM Medical Record Patient Account Number: [id number] Number: Treating RN: Phillis Haggis Mar 26, 1956 (59 y.o. Other Clinician: Date of Birth/Sex: Female) Treating BURNS III, Primary Care Physician/Extender: Karn Cassis Physician: Referring Physician: Jones Broom in Treatment: 23 Information Obtained from: Patient Chief Complaint Chronic bilateral calf ulcers. Electronic Signature(s) Signed: 07/12/2015 4:23:19 PM By: Madelaine Bhat MD Entered By: Madelaine Bhat on 07/12/2015 09:35:44 [name], [name] ([id number]) -------------------------------------------------------------------------------- Debridement Details Patient Name: [name], [name] Date of Service: 07/12/2015 8:00 AM Medical Record Patient Account Number: [id number] Number: Treating RN: Phillis Haggis 03/17/1956 (59 y.o. Other Clinician: Date of Birth/Sex: Female) Treating BURNS III, Primary Care Physician/Extender: Karn Cassis Physician: Referring Physician: Jones Broom in Treatment: 23 Debridement Performed for Wound #1 Right,Anterior Lower Leg Assessment: Performed By: Physician BURNS III, Melanie Crazier., MD Debridement: Debridement Pre-procedure Yes Verification/Time Out Taken: Start Time: 08:43 Pain Control: Other : lidcaine 4% cream Level: Skin/Subcutaneous Tissue Total Area Debrided (L x 5 (cm) x 4 (cm) = 20 (cm) W): Tissue and other Viable, Non-Viable, Exudate, Fat, Fibrin/Slough, Subcutaneous material debrided: Instrument: Curette Bleeding: Minimum Hemostasis Achieved: Pressure End Time: 08:59 Procedural Pain: 0 Post Procedural Pain: 0 Response to Treatment: Procedure was tolerated well Post Debridement Measurements of Total Wound Length: (cm)  18 Width: (cm) 16 Depth: (cm) 0.3 Volume: (cm) 67.858 Post Procedure Diagnosis Same as Pre-procedure Electronic Signature(s) Signed: 07/12/2015 4:23:19 PM By: Madelaine Bhat MD Signed: 07/12/2015 5:26:27 PM By: Alejandro Mulling Entered By: Madelaine Bhat on 07/12/2015 09:34:41 [name], [name] ([id number]) [name], [name] ([id number]) -------------------------------------------------------------------------------- Debridement Details Patient Name: [name], [name] Date of Service: 07/12/2015 8:00 AM Medical Record Patient Account Number: [id number] Number: Treating RN: Phillis Haggis 1955-09-30 (59 y.o. Other Clinician: Date of Birth/Sex: Female) Treating BURNS III, Primary Care Physician/Extender: Karn Cassis Physician: Referring Physician: Jones Broom in Treatment: 23 Debridement Performed for Wound #3 Left,Lateral Lower Leg Assessment: Performed By: Physician BURNS III, Melanie Crazier., MD Debridement: Debridement Pre-procedure Yes Verification/Time Out Taken: Start Time: 08:43 Pain Control: Other : lidcaine 4% cream Level: Skin/Subcutaneous Tissue Total Area Debrided (L x 5 (cm) x 4 (cm) = 20 (cm) W): Tissue and other Viable, Non-Viable, Exudate, Fat, Fibrin/Slough, Subcutaneous material debrided: Instrument: Curette Bleeding: Minimum Hemostasis Achieved: Pressure End Time: 08:59 Procedural Pain: 0 Post Procedural Pain: 0 Response to Treatment: Procedure was tolerated well Post Debridement Measurements of Total Wound Length: (cm) 15.9 Width: (cm) 9 Depth: (cm) 0.8 Volume: (cm) 89.912 Post Procedure Diagnosis Same as Pre-procedure Electronic Signature(s) Signed: 07/12/2015 4:23:19 PM By: Madelaine Bhat MD Signed: 07/12/2015 5:26:27 PM By: Alejandro Mulling Entered By: Madelaine Bhat on 07/12/2015 09:35:02 [name], [name] ([id number]) [name], [name]  ([id number]) -------------------------------------------------------------------------------- Debridement Details Patient Name: [name], [name] Date of Service: 07/12/2015 8:00 AM Medical Record Patient Account Number: [id number] Number: Treating RN: Phillis Haggis [birth year] (59 y.o. Other Clinician: Date of Birth/Sex: Female) Treating BURNS III, Primary Care Physician/Extender: Karn Cassis Physician: Referring Physician: Jones Broom in Treatment: 23 Debridement Performed for Wound #8 Right,Posterior Lower Leg Assessment: Performed By: Physician BURNS III, Melanie Crazier., MD Debridement: Debridement Pre-procedure Yes Verification/Time Out Taken: Start Time: 08:43 Pain Control: Other : lidcaine 4% cream Level: Skin/Subcutaneous Tissue Total Area Debrided (L x 10 (cm) x 6.5 (cm) =  65 (cm) W): Tissue and other Viable, Non-Viable, Exudate, Fat, Fibrin/Slough, Subcutaneous material debrided: Instrument: Curette Bleeding: Minimum Hemostasis Achieved: Pressure End Time: 08:59 Procedural Pain: 0 Post Procedural Pain: 0 Response to Treatment: Procedure was tolerated well Post Debridement Measurements of Total Wound Length: (cm) 10 Width: (cm) 6.5 Depth: (cm) 0.4 Volume: (cm) 20.42 Post Procedure Diagnosis Same as Pre-procedure Electronic Signature(s) Signed: 07/12/2015 4:23:19 PM By: Madelaine Bhat MD Signed: 07/12/2015 5:26:27 PM By: Alejandro Mulling Entered By: Madelaine Bhat on 07/12/2015 09:35:20 [name], [name] ([id number]) [name], [name] ([id number]) -------------------------------------------------------------------------------- Debridement Details Patient Name: [name], Raniya H. Date of Service: 07/12/2015 8:00 AM Medical Record Patient Account Number: [id number] Number: Treating RN: Phillis Haggis 1956/01/03 (59 y.o. Other Clinician: Date of Birth/Sex: Female) Treating BURNS III, Primary  Care Physician/Extender: Karn Cassis Physician: Referring Physician: Jones Broom in Treatment: 23 Debridement Performed for Wound #9 Left,Posterior Lower Leg Assessment: Performed By: Physician BURNS III, Melanie Crazier., MD Debridement: Debridement Pre-procedure Yes Verification/Time Out Taken: Start Time: 08:43 Pain Control: Other : lidcaine 4% cream Level: Skin/Subcutaneous Tissue Total Area Debrided (L x 3.5 (cm) x 1.3 (cm) = 4.55 (cm) W): Tissue and other Viable, Non-Viable, Exudate, Fat, Fibrin/Slough, Subcutaneous material debrided: Instrument: Curette Bleeding: Minimum Hemostasis Achieved: Pressure End Time: 08:59 Procedural Pain: 0 Post Procedural Pain: 0 Response to Treatment: Procedure was tolerated well Post Debridement Measurements of Total Wound Length: (cm) 3.5 Width: (cm) 1.3 Depth: (cm) 0.3 Volume: (cm) 1.072 Post Procedure Diagnosis Same as Pre-procedure Electronic Signature(s) Signed: 07/12/2015 4:23:19 PM By: Madelaine Bhat MD Signed: 07/12/2015 5:26:27 PM By: Alejandro Mulling Entered By: Madelaine Bhat on 07/12/2015 09:35:37 [name], [name] ([id number]) [name], [name] ([id number]) -------------------------------------------------------------------------------- HPI Details Patient Name: Nie, Redell H. Date of Service: 07/12/2015 8:00 AM Medical Record Patient Account Number: [id number] Number: Treating RN: Phillis Haggis [birth year] (59 y.o. Other Clinician: Date of Birth/Sex: Female) Treating BURNS III, Primary Care Physician/Extender: Karn Cassis Physician: Referring Physician: Jones Broom in Treatment: 23 History of Present Illness HPI Description: Pleasant 59 year old with h/o DM (Hgb A1c 7.4 in Aug 2016), ESRD (on hemodialysis since July 2016). Presented to her PCP Dr. Venora Maples with BLE ulcers since June 2016. 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 Left lower extremity ultrasound in May 2016 showed no evidence for DVT. No assessment for venous insufficiency. Culture 04/19/2015 grew methicillin sensitive staph aureus (sensitive to tetracycline), Stenotrophomonas maltophilia, and Enterococcus faecalis. Completed course of doxycycline. Hospitalized in Nov 2016 for worsening cellulitis. Treated with IV antibiotics. No operative debridement or biopsy. Punch biopsy of left calf ulcer 06/28/2015 showed no evidence for malignancy. Performing dressing changes with silver alginate. Tolerating Profore light bilaterally 3x/week. She returns to clinic and reports that her legs feel better with compression. No fever or chills. Less drainage. Electronic Signature(s) Signed: 07/12/2015 4:23:19 PM By: Madelaine Bhat MD Entered By: Madelaine Bhat on 07/12/2015 10:39:15 Smet, [name] ([id number]) -------------------------------------------------------------------------------- Physical Exam Details Patient Name: Sigl, [name] Date of Service: 07/12/2015 8:00 AM Medical Record Patient Account Number: [id number] Number: Treating RN: Phillis Haggis 1955-11-16 (59 y.o. Other Clinician: Date of Birth/Sex: Female) Treating BURNS III, Primary Care Physician/Extender: Karn Cassis Physician: Referring Physician: Fidel Levy Weeks in Treatment: 23 Constitutional . Pulse regular. Respirations normal and unlabored. Afebrile. . Notes Bilateral calf ulcerations improved with compression.  No significant cellulitis. Less necrotic tissue. Persistent 2+ pitting edema. Faintly palpable DP bilaterally. Triphasic on the right. Biphasic on the left. Noncompressible. Electronic Signature(s) Signed: 07/12/2015 4:23:19 PM By: Madelaine Bhat MD Entered By: Madelaine Bhat on 07/12/2015  09:39:25 Ginger, [name] ([id number]) -------------------------------------------------------------------------------- Physician Orders Details Patient Name: Helmers, Calen H. Date of Service: 07/12/2015 8:00 AM Medical Record Patient Account Number: [id number] Number: Treating RN: Phillis Haggis [birth year] (59 y.o. Other Clinician: Date of Birth/Sex: Female) Treating BURNS III, Primary Care Physician/Extender: Karn Cassis Physician: Referring Physician: Jones Broom in Treatment: 41 Verbal / Phone Orders: Yes Clinician: Ashok Cordia, Debi Read Back and Verified: Yes Diagnosis Coding Wound Cleansing Wound #1 Right,Anterior Lower Leg o Clean wound with Normal Saline. o Cleanse wound with mild soap and water o May Shower, gently pat wound dry prior to applying new dressing. Wound #10 Left Gluteus o Clean wound with Normal Saline. o Cleanse wound with mild soap and water o May Shower, gently pat wound dry prior to applying new dressing. Wound #11 Right Gluteus o Clean wound with Normal Saline. o Cleanse wound with mild soap and water o May Shower, gently pat wound dry prior to applying new dressing. Wound #3 Left,Lateral Lower Leg o Clean wound with Normal Saline. o Cleanse wound with mild soap and water o May Shower, gently pat wound dry prior to applying new dressing. Wound #8 Right,Posterior Lower Leg o Clean wound with Normal Saline. o Cleanse wound with mild soap and water o May Shower, gently pat wound dry prior to applying new dressing. Wound #9 Left,Posterior Lower Leg o Clean wound with Normal Saline. o Cleanse wound with mild soap and water o May Shower, gently pat wound dry prior to applying new dressing. Anesthetic Wound #1 Right,Anterior Lower Leg o Topical Lidocaine 4% cream applied to wound bed prior to debridement Pickard, Nuvia H. ([id number]) Wound #3 Left,Lateral Lower  Leg o Topical Lidocaine 4% cream applied to wound bed prior to debridement Wound #8 Right,Posterior Lower Leg o Topical Lidocaine 4% cream applied to wound bed prior to debridement Wound #9 Left,Posterior Lower Leg o Topical Lidocaine 4% cream applied to wound bed prior to debridement Skin Barriers/Peri-Wound Care Wound #10 Left Gluteus o Barrier cream Wound #11 Right Gluteus o Barrier cream Primary Wound Dressing Wound #1 Right,Anterior Lower Leg o Aquacel Ag - Or Equivalent Wound #3 Left,Lateral Lower Leg o Aquacel Ag - Or Equivalent Wound #8 Right,Posterior Lower Leg o Aquacel Ag - Or Equivalent Wound #9 Left,Posterior Lower Leg o Aquacel Ag - Or Equivalent Secondary Dressing Wound #1 Right,Anterior Lower Leg o ABD pad - please add ABD pad to dressing Wound #3 Left,Lateral Lower Leg o ABD pad - please add ABD pad to dressing Wound #8 Right,Posterior Lower Leg o ABD pad - please add ABD pad to dressing Wound #9 Left,Posterior Lower Leg o ABD pad - please add ABD pad to dressing Dressing Change Frequency Wound #1 Right,Anterior Lower Leg o Change Dressing Monday, Wednesday, Friday - Wednesday in wound care center Wound #3 Left,Lateral Lower Leg Dolinsky, Lavra H. ([id number]) o Change Dressing Monday, Wednesday, Friday - Wednesday in wound care center Wound #8 Right,Posterior Lower Leg o Change Dressing Monday, Wednesday, Friday - Wednesday in wound care center Wound #9 Left,Posterior Lower Leg o Change Dressing Monday, Wednesday, Friday - Wednesday in wound care center Follow-up Appointments Wound #1 Right,Anterior Lower Leg o Return Appointment in 1 week. Wound #10 Left Gluteus o Return Appointment in  1 week. Wound #11 Right Gluteus o Return Appointment in 1 week. Wound #3 Left,Lateral Lower Leg o Return Appointment in 1 week. Wound #8 Right,Posterior Lower Leg o Return Appointment in 1 week. Wound #9 Left,Posterior  Lower Leg o Return Appointment in 1 week. Edema Control Wound #1 Right,Anterior Lower Leg o 3 Layer Compression System - Bilateral - Profore lite o Elevate legs to the level of the heart and pump ankles as often as possible - Elevate legs at Dialysis Wound #3 Left,Lateral Lower Leg o 3 Layer Compression System - Bilateral - Profore lite o Elevate legs to the level of the heart and pump ankles as often as possible - Elevate legs at Dialysis Wound #8 Right,Posterior Lower Leg o 3 Layer Compression System - Bilateral - Profore lite o Elevate legs to the level of the heart and pump ankles as often as possible - Elevate legs at Dialysis Wound #9 Left,Posterior Lower Leg o 3 Layer Compression System - Bilateral - Profore lite o Elevate legs to the level of the heart and pump ankles as often as possible - Elevate legs at Dialysis Forinash, Iline H. ([id number]) Off-Loading Wound #10 Left Gluteus o Turn and reposition every 2 hours Wound #11 Right Gluteus o Turn and reposition every 2 hours Home Health Wound #1 Right,Anterior Lower Leg o Continue Home Health Visits - Advanced: Monday, Wednesday and Friday **WRAP GOES FROM BASE OF TOES TO THREE FINGER WIDTHS BENEATH BEND IN KNEE. o Home Health Nurse may visit PRN to address patientos wound care needs. o FACE TO FACE ENCOUNTER: MEDICARE and MEDICAID PATIENTS: I certify that this patient is under my care and that I had a face-to-face encounter that meets the physician face-to-face encounter requirements with this patient on this date. The encounter with the patient was in whole or in part for the following MEDICAL CONDITION: (primary reason for Home Healthcare) MEDICAL NECESSITY: I certify, that based on my findings, NURSING services are a medically necessary home health service. HOME BOUND STATUS: I certify that my clinical findings support that this patient is homebound (i.e., Due to illness or injury, pt  requires aid of supportive devices such as crutches, cane, wheelchairs, walkers, the use of special transportation or the assistance of another person to leave their place of residence. There is a normal inability to leave the home and doing so requires considerable and taxing effort. Other absences are for medical reasons / religious services and are infrequent or of short duration when for other reasons). o If current dressing causes regression in wound condition, may D/C ordered dressing product/s and apply Normal Saline Moist Dressing daily until next Wound Healing Center / Other MD appointment. Notify Wound Healing Center of regression in wound condition at [phone]. o Please direct any NON-WOUND related issues/requests for orders to patient's Primary Care Physician Wound #3 Left,Lateral Lower Leg o Continue Home Health Visits - Advanced: Monday, Wednesday and Friday **WRAP GOES FROM BASE OF TOES TO THREE FINGER WIDTHS BENEATH BEND IN KNEE. o Home Health Nurse may visit PRN to address patientos wound care needs. o FACE TO FACE ENCOUNTER: MEDICARE and MEDICAID PATIENTS: I certify that this patient is under my care and that I had a face-to-face encounter that meets the physician face-to-face encounter requirements with this patient on this date. The encounter with the patient was in whole or in part for the following MEDICAL CONDITION: (primary reason for Home Healthcare) MEDICAL NECESSITY: I certify, that based on my findings, NURSING services are a medically  necessary home health service. HOME BOUND STATUS: I certify that my clinical findings support that this patient is homebound (i.e., Due to illness or injury, pt requires aid of supportive devices such as crutches, cane, wheelchairs, walkers, the use of special transportation or the assistance of another person to leave their place of residence. There is a normal inability to leave the home and doing so requires  considerable and taxing effort. Other absences are for medical reasons / religious services and are infrequent or of short duration when for other reasons). o If current dressing causes regression in wound condition, may D/C ordered dressing product/s and apply Normal Saline Moist Dressing daily until next Wound Healing Center / Other MD appointment. Notify Wound Healing Center of regression in wound condition at [phone]. ALEKSANDRA, RABEN ([id number]) o Please direct any NON-WOUND related issues/requests for orders to patient's Primary Care Physician Wound #8 Right,Posterior Lower Leg o Continue Home Health Visits - Advanced: Monday, Wednesday and Friday **WRAP GOES FROM BASE OF TOES TO THREE FINGER WIDTHS BENEATH BEND IN KNEE. o Home Health Nurse may visit PRN to address patientos wound care needs. o FACE TO FACE ENCOUNTER: MEDICARE and MEDICAID PATIENTS: I certify that this patient is under my care and that I had a face-to-face encounter that meets the physician face-to-face encounter requirements with this patient on this date. The encounter with the patient was in whole or in part for the following MEDICAL CONDITION: (primary reason for Home Healthcare) MEDICAL NECESSITY: I certify, that based on my findings, NURSING services are a medically necessary home health service. HOME BOUND STATUS: I certify that my clinical findings support that this patient is homebound (i.e., Due to illness or injury, pt requires aid of supportive devices such as crutches, cane, wheelchairs, walkers, the use of special transportation or the assistance of another person to leave their place of residence. There is a normal inability to leave the home and doing so requires considerable and taxing effort. Other absences are for medical reasons / religious services and are infrequent or of short duration when for other reasons). o If current dressing causes regression in wound condition, may D/C  ordered dressing product/s and apply Normal Saline Moist Dressing daily until next Wound Healing Center / Other MD appointment. Notify Wound Healing Center of regression in wound condition at [phone]. o Please direct any NON-WOUND related issues/requests for orders to patient's Primary Care Physician Wound #9 Left,Posterior Lower Leg o Continue Home Health Visits - Advanced: Monday, Wednesday and Friday **WRAP GOES FROM BASE OF TOES TO THREE FINGER WIDTHS BENEATH BEND IN KNEE. o Home Health Nurse may visit PRN to address patientos wound care needs. o FACE TO FACE ENCOUNTER: MEDICARE and MEDICAID PATIENTS: I certify that this patient is under my care and that I had a face-to-face encounter that meets the physician face-to-face encounter requirements with this patient on this date. The encounter with the patient was in whole or in part for the following MEDICAL CONDITION: (primary reason for Home Healthcare) MEDICAL NECESSITY: I certify, that based on my findings, NURSING services are a medically necessary home health service. HOME BOUND STATUS: I certify that my clinical findings support that this patient is homebound (i.e., Due to illness or injury, pt requires aid of supportive devices such as crutches, cane, wheelchairs, walkers, the use of special transportation or the assistance of another person to leave their place of residence. There is a normal inability to leave the home and doing so requires considerable and taxing  effort. Other absences are for medical reasons / religious services and are infrequent or of short duration when for other reasons). o If current dressing causes regression in wound condition, may D/C ordered dressing product/s and apply Normal Saline Moist Dressing daily until next Wound Healing Center / Other MD appointment. Notify Wound Healing Center of regression in wound condition at [phone]. o Please direct any NON-WOUND related  issues/requests for orders to patient's Primary Care Physician Services and Therapies o Venous Studies -Bilateral - [facility] NIOMA, MCCUBBINS ([id number]) Electronic Signature(s) Signed: 07/12/2015 4:23:19 PM By: Madelaine Bhat MD Signed: 07/12/2015 5:26:27 PM By: Alejandro Mulling Entered By: Alejandro Mulling on 07/12/2015 11:25:08 Younkin, Amalia H. ([id number]) -------------------------------------------------------------------------------- Problem List Details Patient Name: Wolgamott, Sumiye H. Date of Service: 07/12/2015 8:00 AM Medical Record Patient Account Number: [id number] Number: Treating RN: Phillis Haggis 1956/04/11 (59 y.o. Other Clinician: Date of Birth/Sex: Female) Treating BURNS III, Primary Care Physician/Extender: Karn Cassis Physician: Referring Physician: Jones Broom in Treatment: 23 Active Problems ICD-10 Encounter Code Description Active Date Diagnosis E11.622 Type 2 diabetes mellitus with other skin ulcer 01/30/2015 Yes N18.6 End stage renal disease 01/30/2015 Yes L97.222 Non-pressure chronic ulcer of left calf with fat layer 01/30/2015 Yes exposed L97.212 Non-pressure chronic ulcer of right calf with fat layer 01/30/2015 Yes exposed R60.0 Localized edema 04/19/2015 Yes I89.0 Lymphedema, not elsewhere classified 07/12/2015 Yes Inactive Problems Resolved Problems Electronic Signature(s) Signed: 07/12/2015 4:23:19 PM By: Madelaine Bhat MD Entered By: Madelaine Bhat on 07/12/2015 09:32:50 Ryback, Makaiyah H. ([id number]) -------------------------------------------------------------------------------- Progress Note Details Patient Name: Nutting, Kimba H. Date of Service: 07/12/2015 8:00 AM Medical Record Patient Account Number: [id number] Number: Treating RN: Phillis Haggis 03/04/1956 (59 y.o. Other Clinician: Date of Birth/Sex: Female) Treating BURNS III, Primary Care Physician/Extender:  Karn Cassis Physician: Referring Physician: Jones Broom in Treatment: 23 Subjective Chief Complaint Information obtained from Patient Chronic bilateral calf ulcers. History of Present Illness (HPI) Pleasant 59 year old with h/o DM (Hgb A1c 7.4 in Aug 2016), ESRD (on hemodialysis since July 2016). Presented to her PCP Dr. Venora Maples with BLE ulcers since June 2016. 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 Left lower extremity ultrasound in May 2016 showed no evidence for DVT. No assessment for venous insufficiency. Culture 04/19/2015 grew methicillin sensitive staph aureus (sensitive to tetracycline), Stenotrophomonas maltophilia, and Enterococcus faecalis. Completed course of doxycycline. Hospitalized in Nov 2016 for worsening cellulitis. Treated with IV antibiotics. No operative debridement or biopsy. Punch biopsy of left calf ulcer 06/28/2015 showed no evidence for malignancy. Performing dressing changes with silver alginate. Tolerating Profore light bilaterally 3x/week. She returns to clinic and reports that her legs feel better with compression. No fever or chills. Less drainage. Objective Constitutional Pulse regular. Respirations normal and unlabored. Afebrile. Vitals Time Taken: 8:03 AM, Height: 68 in, Weight: 294 lbs, BMI: 44.7, Temperature: 98.1 F, Pulse: 96 bpm, Respiratory Rate: 18 breaths/min, Blood Pressure: 137/82 mmHg. Kettles, Koni HMarland Kitchen ([id number]) General Notes: Bilateral calf ulcerations improved with compression. No significant cellulitis. Less necrotic tissue. Persistent 2+ pitting edema. Faintly palpable DP bilaterally. Triphasic on the right. Biphasic on the left. Noncompressible. Integumentary (Hair, Skin) Wound #1 status is Open. Original cause of wound was Gradually Appeared. The wound is located on  the Right,Anterior Lower Leg. The wound measures 18cm length x 16cm width x 0.2cm depth; 226.195cm^2 area and 45.239cm^3 volume. The wound  is limited to skin breakdown. There is no tunneling or undermining noted. There is a large amount of serous drainage noted. The wound margin is thickened. There is small (1-33%) pink granulation within the wound bed. There is a large (67-100%) amount of necrotic tissue within the wound bed including Eschar and Adherent Slough. The periwound skin appearance exhibited: Scarring, Moist. The periwound skin appearance did not exhibit: Callus, Crepitus, Excoriation, Fluctuance, Friable, Induration, Localized Edema, Rash, Dry/Scaly, Maceration, Atrophie Blanche, Cyanosis, Ecchymosis, Hemosiderin Staining, Mottled, Pallor, Rubor, Erythema. Periwound temperature was noted as No Abnormality. The periwound has tenderness on palpation. Wound #10 status is Open. Original cause of wound was Gradually Appeared. The wound is located on the Left Gluteus. The wound measures 3cm length x 2.5cm width x 0.1cm depth; 5.89cm^2 area and 0.589cm^3 volume. The wound is limited to skin breakdown. There is no tunneling or undermining noted. There is a medium amount of serosanguineous drainage noted. The wound margin is thickened. There is no granulation within the wound bed. There is a large (67-100%) amount of necrotic tissue within the wound bed including Eschar. The periwound skin appearance exhibited: Dry/Scaly. The periwound skin appearance did not exhibit: Callus, Crepitus, Excoriation, Fluctuance, Friable, Induration, Localized Edema, Rash, Scarring, Maceration, Moist, Atrophie Blanche, Cyanosis, Ecchymosis, Hemosiderin Staining, Mottled, Pallor, Rubor, Erythema. Wound #11 status is Open. Original cause of wound was Gradually Appeared. The wound is located on the Right Gluteus. The wound measures 0.3cm length x 0.6cm width x 0.1cm depth; 0.141cm^2 area and 0.014cm^3 volume. There  is no tunneling or undermining noted. There is a medium amount of serosanguineous drainage noted. The wound margin is flat and intact. There is no granulation within the wound bed. There is a large (67-100%) amount of necrotic tissue within the wound bed including Eschar. The periwound skin appearance exhibited: Moist. Periwound temperature was noted as No Abnormality. The periwound has tenderness on palpation. Wound #3 status is Open. Original cause of wound was Gradually Appeared. The wound is located on the Left,Lateral Lower Leg. The wound measures 15.5cm length x 9cm width x 0.7cm depth; 109.563cm^2 area and 76.694cm^3 volume. The wound is limited to skin breakdown. There is no tunneling or undermining noted. There is a large amount of serous drainage noted. The wound margin is flat and intact. There is medium (34-66%) red granulation within the wound bed. There is a medium (34-66%) amount of necrotic tissue within the wound bed including Adherent Slough. The periwound skin appearance exhibited: Scarring, Moist. The periwound skin appearance did not exhibit: Callus, Crepitus, Excoriation, Fluctuance, Friable, Induration, Localized Edema, Rash, Dry/Scaly, Maceration, Atrophie Blanche, Cyanosis, Ecchymosis, Hemosiderin Staining, Mottled, Pallor, Rubor, Erythema. Periwound temperature was noted as No Abnormality. The periwound has tenderness on palpation. Wound #8 status is Open. Original cause of wound was Gradually Appeared. The wound is located on the Right,Posterior Lower Leg. The wound measures 10cm length x 6.5cm width x 0.3cm depth; 51.051cm^2 area and 15.315cm^3 volume. The wound is limited to skin breakdown. There is no tunneling or undermining noted. There is a large amount of serous drainage noted. The wound margin is flat and intact. There is medium (34-66%) red granulation within the wound bed. There is a medium (34-66%) amount of necrotic tissue within the wound bed including  Eschar and Adherent Slough. The periwound skin Grayson, Yaniris H. ([id number]) appearance exhibited: Localized Edema, Moist. The periwound skin appearance did not exhibit: Callus, Crepitus, Excoriation, Fluctuance, Friable, Induration, Rash, Scarring, Dry/Scaly, Maceration, Atrophie Blanche, Cyanosis, Ecchymosis, Hemosiderin Staining, Mottled, Pallor,  Rubor, Erythema. Periwound temperature was noted as No Abnormality. The periwound has tenderness on palpation. Wound #9 status is Open. Original cause of wound was Gradually Appeared. The wound is located on the Left,Posterior Lower Leg. The wound measures 3.5cm length x 1.3cm width x 0.2cm depth; 3.574cm^2 area and 0.715cm^3 volume. There is no tunneling or undermining noted. There is a large amount of serous drainage noted. The wound margin is thickened. There is large (67-100%) pink granulation within the wound bed. There is a small (1-33%) amount of necrotic tissue within the wound bed including Adherent Slough. The periwound skin appearance exhibited: Scarring, Moist. Periwound temperature was noted as No Abnormality. The periwound has tenderness on palpation. 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 There was a Skin/Subcutaneous Tissue Debridement ([id number]) debridement with total area of 20 sq cm performed by BURNS III, Melanie Crazier., MD. with the following instrument(s): Curette to remove Viable and Non-Viable tissue/material including Exudate, Fat,  Fibrin/Slough, and Subcutaneous after achieving pain control using Other (lidcaine 4% cream). A time out was conducted prior to the start of the procedure. A Minimum amount of bleeding was controlled with Pressure. The procedure was tolerated well with a pain level of 0 throughout and a pain level of 0 following the procedure. Post Debridement Measurements: 18cm length x 16cm width x 0.3cm depth; 67.858cm^3 volume. Post procedure Diagnosis Wound #1: Same as Pre-Procedure Pelphrey, Avarey H. 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 There was a Skin/Subcutaneous Tissue Debridement ([id number]) debridement with total area of 20 sq cm performed by BURNS III, Melanie Crazier., MD. with the following instrument(s): Curette to remove Viable and Non-Viable tissue/material including Exudate, Fat, Fibrin/Slough, and Subcutaneous after achieving pain control using Other (lidcaine 4% cream). A time out was conducted prior to the start of the procedure. A Minimum amount of bleeding was controlled with Pressure. The procedure was tolerated well with a pain level of 0 throughout and a pain level of 0 following the procedure. Post Debridement Measurements: 15.9cm length x 9cm width x 0.8cm depth; 89.912cm^3 volume. 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 There was a Skin/Subcutaneous Tissue Debridement ([id number]) debridement with total area of 65 sq cm performed by BURNS III, Melanie Crazier., MD. with the following instrument(s): Curette to remove Viable and Non-Viable tissue/material including Exudate, Fat, Fibrin/Slough, and Subcutaneous after achieving pain control using Other (lidcaine 4% cream). A time out was conducted prior to the start of the procedure. A Minimum amount of bleeding was controlled with Pressure. The procedure was tolerated well with a pain level of 0  throughout and a pain level of 0 following the procedure. Post Debridement Measurements: 10cm length x 6.5cm width x 0.4cm depth; 20.42cm^3 volume. 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 There was a Skin/Subcutaneous Tissue Debridement ([id number]) debridement with total area of 4.55 sq cm performed by BURNS III,  Melanie Crazier., MD. with the following instrument(s): Curette to remove Viable and Non-Viable tissue/material including Exudate, Fat, Fibrin/Slough, and Subcutaneous after achieving pain control using Other (lidcaine 4% cream). A time out was conducted prior to the start of the procedure. A Minimum amount of bleeding was controlled with Pressure. The procedure was tolerated well with a pain level of 0 throughout and a pain level of 0 following the procedure. Post Debridement Measurements: 3.5cm length x 1.3cm width x 0.3cm depth; 1.072cm^3 volume. Post procedure Diagnosis Wound #9: Same as Pre-Procedure Plan Wound Cleansing: Wound #1 Right,Anterior Lower Leg: Clean wound with Normal Saline. Cleanse wound with mild soap and water May Shower, gently pat wound dry prior to applying new dressing. Wound #10 Left Gluteus: Clean wound with Normal Saline. Cleanse wound with mild soap and water Cupo, Shataria H. ([id number]) May Shower, gently pat wound dry prior to applying new dressing. Wound #11 Right Gluteus: Clean wound with Normal Saline. Cleanse wound with mild soap and water May Shower, gently pat wound dry prior to applying new dressing. Wound #3 Left,Lateral Lower Leg: Clean wound with Normal Saline. Cleanse wound with mild soap and water May Shower, gently pat wound dry prior to applying new dressing. Wound #8 Right,Posterior Lower Leg: Clean wound with Normal Saline. Cleanse wound with mild soap and water May Shower, gently pat wound dry prior to applying new dressing. Wound  #9 Left,Posterior Lower Leg: Clean wound with Normal Saline. Cleanse wound with mild soap and water May Shower, gently pat wound dry prior to applying new dressing. Anesthetic: Wound #1 Right,Anterior Lower Leg: Topical Lidocaine 4% cream applied to wound bed prior to debridement Wound #3 Left,Lateral Lower Leg: Topical Lidocaine 4% cream applied to wound bed prior to debridement Wound #8 Right,Posterior Lower Leg: Topical Lidocaine 4% cream applied to wound bed prior to debridement Wound #9 Left,Posterior Lower Leg: Topical Lidocaine 4% cream applied to wound bed prior to debridement Skin Barriers/Peri-Wound Care: Wound #10 Left Gluteus: Barrier cream Wound #11 Right Gluteus: Barrier cream Primary Wound Dressing: Wound #1 Right,Anterior Lower Leg: Aquacel Ag - Or Equivalent Wound #3 Left,Lateral Lower Leg: Aquacel Ag - Or Equivalent Wound #8 Right,Posterior Lower Leg: Aquacel Ag - Or Equivalent Wound #9 Left,Posterior Lower Leg: Aquacel Ag - Or Equivalent Secondary Dressing: Wound #1 Right,Anterior Lower Leg: ABD pad - please add ABD pad to dressing Wound #3 Left,Lateral Lower Leg: ABD pad - please add ABD pad to dressing Wound #8 Right,Posterior Lower Leg: ABD pad - please add ABD pad to dressing Wound #9 Left,Posterior Lower Leg: ABD pad - please add ABD pad to dressing Dressing Change Frequency: Wound #1 Right,Anterior Lower Leg: Glover, Lanisa H. ([id number]) Change Dressing Monday, Wednesday, Friday - Wednesday in wound care center Wound #3 Left,Lateral Lower Leg: Change Dressing Monday, Wednesday, Friday - Wednesday in wound care center Wound #8 Right,Posterior Lower Leg: Change Dressing Monday, Wednesday, Friday - Wednesday in wound care center Wound #9 Left,Posterior Lower Leg: Change Dressing Monday, Wednesday, Friday - Wednesday in wound care center Follow-up Appointments: Wound #1 Right,Anterior Lower Leg: Return Appointment in 1 week. Wound #10 Left  Gluteus: Return Appointment in 1 week. Wound #11 Right Gluteus: Return Appointment in 1 week. Wound #3 Left,Lateral Lower Leg: Return Appointment in 1 week. Wound #8 Right,Posterior Lower Leg: Return Appointment in 1 week. Wound #9 Left,Posterior Lower Leg: Return Appointment in 1 week. Edema Control: Wound #1 Right,Anterior Lower Leg: 3 Layer Compression System - Bilateral - Profore lite Elevate legs to the level  of the heart and pump ankles as often as possible - Elevate legs at Dialysis Wound #3 Left,Lateral Lower Leg: 3 Layer Compression System - Bilateral - Profore lite Elevate legs to the level of the heart and pump ankles as often as possible - Elevate legs at Dialysis Wound #8 Right,Posterior Lower Leg: 3 Layer Compression System - Bilateral - Profore lite Elevate legs to the level of the heart and pump ankles as often as possible - Elevate legs at Dialysis Wound #9 Left,Posterior Lower Leg: 3 Layer Compression System - Bilateral - Profore lite Elevate legs to the level of the heart and pump ankles as often as possible - Elevate legs at Dialysis Off-Loading: Wound #10 Left Gluteus: Turn and reposition every 2 hours Wound #11 Right Gluteus: Turn and reposition every 2 hours Home Health: Wound #1 Right,Anterior Lower Leg: Continue Home Health Visits - [facility]: Monday, Wednesday and Friday **WRAP GOES FROM BASE OF TOES TO THREE FINGER WIDTHS BENEATH BEND IN KNEE. Home Health Nurse may visit PRN to address patient s wound care needs. FACE TO FACE ENCOUNTER: MEDICARE and MEDICAID PATIENTS: I certify that this patient is under my care and that I had a face-to-face encounter that meets the physician face-to-face encounter requirements with this patient on this date. The encounter with the patient was in whole or in part for the following MEDICAL CONDITION: (primary reason for Home Healthcare) MEDICAL NECESSITY: I certify, that based on my findings, NURSING services are a  medically necessary home health service. HOME BOUND STATUS: I certify that my clinical findings support that this patient is homebound (i.e., Due to illness or injury, pt requires aid of supportive devices such as crutches, cane, wheelchairs, walkers, the use of special transportation or the assistance of another person to leave their place of residence. There is a Kizziah, Irving H. ([id number]) normal inability to leave the home and doing so requires considerable and taxing effort. Other absences are for medical reasons / religious services and are infrequent or of short duration when for other reasons). If current dressing causes regression in wound condition, may D/C ordered dressing product/s and apply Normal Saline Moist Dressing daily until next Wound Healing Center / Other MD appointment. Notify Wound Healing Center of regression in wound condition at [phone]. Please direct any NON-WOUND related issues/requests for orders to patient's Primary Care Physician Wound #3 Left,Lateral Lower Leg: Continue Home Health Visits - [facility]: Monday, Wednesday and Friday **WRAP GOES FROM BASE OF TOES TO THREE FINGER WIDTHS BENEATH BEND IN KNEE. Home Health Nurse may visit PRN to address patient s wound care needs. FACE TO FACE ENCOUNTER: MEDICARE and MEDICAID PATIENTS: I certify that this patient is under my care and that I had a face-to-face encounter that meets the physician face-to-face encounter requirements with this patient on this date. The encounter with the patient was in whole or in part for the following MEDICAL CONDITION: (primary reason for Home Healthcare) MEDICAL NECESSITY: I certify, that based on my findings, NURSING services are a medically necessary home health service. HOME BOUND STATUS: I certify that my clinical findings support that this patient is homebound (i.e., Due to illness or injury, pt requires aid of supportive devices such as crutches, cane, wheelchairs, walkers,  the use of special transportation or the assistance of another person to leave their place of residence. There is a normal inability to leave the home and doing so requires considerable and taxing effort. Other absences are for medical reasons / religious services and are  infrequent or of short duration when for other reasons). If current dressing causes regression in wound condition, may D/C ordered dressing product/s and apply Normal Saline Moist Dressing daily until next Wound Healing Center / Other MD appointment. Notify Wound Healing Center of regression in wound condition at [phone]. Please direct any NON-WOUND related issues/requests for orders to patient's Primary Care Physician Wound #8 Right,Posterior Lower Leg: Continue Home Health Visits - [facility]: Monday, Wednesday and Friday **WRAP GOES FROM BASE OF TOES TO THREE FINGER WIDTHS BENEATH BEND IN KNEE. Home Health Nurse may visit PRN to address patient s wound care needs. FACE TO FACE ENCOUNTER: MEDICARE and MEDICAID PATIENTS: I certify that this patient is under my care and that I had a face-to-face encounter that meets the physician face-to-face encounter requirements with this patient on this date. The encounter with the patient was in whole or in part for the following MEDICAL CONDITION: (primary reason for Home Healthcare) MEDICAL NECESSITY: I certify, that based on my findings, NURSING services are a medically necessary home health service. HOME BOUND STATUS: I certify that my clinical findings support that this patient is homebound (i.e., Due to illness or injury, pt requires aid of supportive devices such as crutches, cane, wheelchairs, walkers, the use of special transportation or the assistance of another person to leave their place of residence. There is a normal inability to leave the home and doing so requires considerable and taxing effort. Other absences are for medical reasons / religious services and are  infrequent or of short duration when for other reasons). If current dressing causes regression in wound condition, may D/C ordered dressing product/s and apply Normal Saline Moist Dressing daily until next Wound Healing Center / Other MD appointment. Notify Wound Healing Center of regression in wound condition at [phone]. Please direct any NON-WOUND related issues/requests for orders to patient's Primary Care Physician Wound #9 Left,Posterior Lower Leg: Continue Home Health Visits - [facility]: Monday, Wednesday and Friday **WRAP GOES FROM BASE OF TOES TO THREE FINGER WIDTHS BENEATH BEND IN KNEE. Home Health Nurse may visit PRN to address patient s wound care needs. FACE TO FACE ENCOUNTER: MEDICARE and MEDICAID PATIENTS: I certify that this patient is under my care and that I had a face-to-face encounter that meets the physician face-to-face encounter requirements with this patient on this date. The encounter with the patient was in whole or in part for the following MEDICAL CONDITION: (primary reason for Home Healthcare) MEDICAL NECESSITY: I certify, that based on my findings, NURSING services are a medically necessary home health service. HOME Crupi, Sindy H. ([id number]) BOUND STATUS: I certify that my clinical findings support that this patient is homebound (i.e., Due to illness or injury, pt requires aid of supportive devices such as crutches, cane, wheelchairs, walkers, the use of special transportation or the assistance of another person to leave their place of residence. There is a normal inability to leave the home and doing so requires considerable and taxing effort. Other absences are for medical reasons / religious services and are infrequent or of short duration when for other reasons). If current dressing causes regression in wound condition, may D/C ordered dressing product/s and apply Normal Saline Moist Dressing daily until next Wound Healing Center / Other MD appointment.  Notify Wound Healing Center of regression in wound condition at [phone]. Please direct any NON-WOUND related issues/requests for orders to patient's Primary Care Physician Continue with silver alginate and Profore light compression bandages 3 times weekly. Patient is willing to  undergo venous ultrasound to assess for chronic venous insufficiency. We will schedule at [facility]. Electronic Signature(s) Signed: 07/12/2015 4:23:19 PM By: Madelaine Bhat MD Entered By: Madelaine Bhat on 07/12/2015 10:39:47 Whelpley, [name] ([id number]) -------------------------------------------------------------------------------- SuperBill Details Patient Name: Moder, Lendora H. Date of Service: 07/12/2015 Medical Record Patient Account Number: [id number] Number: Treating RN: Phillis Haggis 01-13-1956 (59 y.o. Other Clinician: Date of Birth/Sex: Female) Treating BURNS III, Primary Care Physician/Extender: Karn Cassis Physician: Weeks in Treatment: 23 Referring Physician: Fidel Levy Diagnosis Coding ICD-10 Codes Code Description [phone] Type 2 diabetes mellitus with other skin ulcer N18.6 End stage renal disease L97.222 Non-pressure chronic ulcer of left calf with fat layer exposed L97.212 Non-pressure chronic ulcer of right calf with fat layer exposed R60.0 Localized edema I89.0 Lymphedema, not elsewhere classified Facility Procedures CPT4 Code: [id number] Description: 11042 - DEB SUBQ TISSUE 20 SQ CM/< ICD-10 Description Diagnosis L97.222 Non-pressure chronic ulcer of left calf with fat l L97.212 Non-pressure chronic ulcer of right calf with fat Modifier: ayer exposed layer exposed Quantity: 1 CPT4 Code: [id number] Description: 11045 - DEB SUBQ TISS EA ADDL 20CM ICD-10 Description Diagnosis L97.222 Non-pressure chronic ulcer of left calf with fat l L97.212 Non-pressure chronic ulcer of right calf with fat Modifier: ayer exposed layer  exposed Quantity: 5 Physician Procedures CPT4 Code: [id number] Description: 11042 - WC PHYS SUBQ TISS 20 SQ CM ICD-10 Description Diagnosis L97.222 Non-pressure chronic ulcer of left calf with fat la L97.212 Non-pressure chronic ulcer of right calf with fat l Modifier: yer exposed ayer exposed Quantity: 1 CPT4 Code: [id number] Riesen, PAM Description: 11045 - WC PHYS SUBQ TISS EA ADDL 20 CM ELA H. ([id number]) Modifier: Quantity: 5 Electronic Signature(s) Signed: 07/12/2015 4:23:19 PM By: Madelaine Bhat MD Entered By: Madelaine Bhat on 07/12/2015 09:40:58

## 2015-07-14 DIAGNOSIS — N186 End stage renal disease: Secondary | ICD-10-CM | POA: Diagnosis not present

## 2015-07-14 DIAGNOSIS — Z992 Dependence on renal dialysis: Secondary | ICD-10-CM | POA: Diagnosis not present

## 2015-07-14 DIAGNOSIS — D631 Anemia in chronic kidney disease: Secondary | ICD-10-CM | POA: Diagnosis not present

## 2015-07-14 DIAGNOSIS — D509 Iron deficiency anemia, unspecified: Secondary | ICD-10-CM | POA: Diagnosis not present

## 2015-07-14 DIAGNOSIS — N2581 Secondary hyperparathyroidism of renal origin: Secondary | ICD-10-CM | POA: Diagnosis not present

## 2015-07-18 DIAGNOSIS — Z79899 Other long term (current) drug therapy: Secondary | ICD-10-CM | POA: Diagnosis not present

## 2015-07-18 DIAGNOSIS — E1165 Type 2 diabetes mellitus with hyperglycemia: Secondary | ICD-10-CM | POA: Diagnosis not present

## 2015-07-18 DIAGNOSIS — Z794 Long term (current) use of insulin: Secondary | ICD-10-CM | POA: Diagnosis not present

## 2015-07-19 ENCOUNTER — Encounter: Payer: BC Managed Care – PPO | Attending: Surgery | Admitting: Surgery

## 2015-07-19 DIAGNOSIS — L97212 Non-pressure chronic ulcer of right calf with fat layer exposed: Secondary | ICD-10-CM | POA: Insufficient documentation

## 2015-07-19 DIAGNOSIS — Z992 Dependence on renal dialysis: Secondary | ICD-10-CM | POA: Diagnosis not present

## 2015-07-19 DIAGNOSIS — L97222 Non-pressure chronic ulcer of left calf with fat layer exposed: Secondary | ICD-10-CM | POA: Diagnosis not present

## 2015-07-19 DIAGNOSIS — N186 End stage renal disease: Secondary | ICD-10-CM | POA: Insufficient documentation

## 2015-07-19 DIAGNOSIS — E11622 Type 2 diabetes mellitus with other skin ulcer: Secondary | ICD-10-CM | POA: Insufficient documentation

## 2015-07-19 DIAGNOSIS — R600 Localized edema: Secondary | ICD-10-CM | POA: Insufficient documentation

## 2015-07-19 DIAGNOSIS — I890 Lymphedema, not elsewhere classified: Secondary | ICD-10-CM | POA: Diagnosis not present

## 2015-07-20 DIAGNOSIS — N186 End stage renal disease: Secondary | ICD-10-CM | POA: Diagnosis not present

## 2015-07-20 DIAGNOSIS — E119 Type 2 diabetes mellitus without complications: Secondary | ICD-10-CM | POA: Diagnosis not present

## 2015-07-20 DIAGNOSIS — L97922 Non-pressure chronic ulcer of unspecified part of left lower leg with fat layer exposed: Secondary | ICD-10-CM | POA: Diagnosis not present

## 2015-07-20 DIAGNOSIS — T82318A Breakdown (mechanical) of other vascular grafts, initial encounter: Secondary | ICD-10-CM | POA: Diagnosis not present

## 2015-07-20 DIAGNOSIS — I890 Lymphedema, not elsewhere classified: Secondary | ICD-10-CM | POA: Diagnosis not present

## 2015-07-20 DIAGNOSIS — Y841 Kidney dialysis as the cause of abnormal reaction of the patient, or of later complication, without mention of misadventure at the time of the procedure: Secondary | ICD-10-CM | POA: Diagnosis not present

## 2015-07-20 DIAGNOSIS — M7989 Other specified soft tissue disorders: Secondary | ICD-10-CM | POA: Diagnosis not present

## 2015-07-20 DIAGNOSIS — L97912 Non-pressure chronic ulcer of unspecified part of right lower leg with fat layer exposed: Secondary | ICD-10-CM | POA: Diagnosis not present

## 2015-07-20 NOTE — Progress Notes (Signed)
[name], [name] ([id number]) Visit Report for 07/19/2015 Chief Complaint Document Details Patient Name: [name], [name]. Date of Service: 07/19/2015 8:00 AM Medical Record Patient Account Number: [id number] Number: Treating RN: 06/04/1956 (60 y.o. Other Clinician: Date of Birth/Sex: Female) Treating BURNS III, Primary Care Physician/Extender: Karn Cassis Physician: Referring Physician: Jones Broom in Treatment: 24 Information Obtained from: Patient Chief Complaint Chronic bilateral calf ulcers. Electronic Signature(s) Signed: 07/20/2015 8:51:38 AM By: Madelaine Bhat MD Entered By: Madelaine Bhat on 07/19/2015 09:32:11 [name], [name] ([id number]) -------------------------------------------------------------------------------- Debridement Details Patient Name: [name], [name] Date of Service: 07/19/2015 8:00 AM Medical Record Patient Account Number: [id number] Number: Treating RN: [birth year] (60 y.o. Other Clinician: Date of Birth/Sex: Female) Treating BURNS III, Primary Care Physician/Extender: Karn Cassis Physician: Referring Physician: Jones Broom in Treatment: 24 Debridement Performed for Wound #1 Right,Anterior Lower Leg Assessment: Performed By: Physician BURNS III, Melanie Crazier., MD Debridement: Debridement Pre-procedure Yes Verification/Time Out Taken: Start Time: 08:45 Pain Control: Other : lidocaine 4% Level: Skin/Subcutaneous Tissue Total Area Debrided (L x 6 (cm) x 8 (cm) = 48 (cm) W): Tissue and other Viable, Non-Viable, Exudate, Fat, Fibrin/Slough, Subcutaneous material debrided: Instrument: Curette Bleeding: Minimum Hemostasis Achieved: Pressure End Time: 08:51 Procedural Pain: 0 Post Procedural Pain: 0 Response to Treatment: Procedure was tolerated well Post Debridement Measurements of Total Wound Length: (cm) 18 Width: (cm) 18 Depth: (cm) 0.3 Volume: (cm)  76.341 Post Procedure Diagnosis Same as Pre-procedure Electronic Signature(s) Signed: 07/20/2015 8:51:38 AM By: Madelaine Bhat MD Entered By: Madelaine Bhat on 07/19/2015 09:31:04 [name], Yumalay Rexene [name] ([id number]) -------------------------------------------------------------------------------- Debridement Details Patient Name: [name], [name] Date of Service: 07/19/2015 8:00 AM Medical Record Patient Account Number: [id number] Number: Treating RN: [birth year] (60 y.o. Other Clinician: Date of Birth/Sex: Female) Treating BURNS III, Primary Care Physician/Extender: Karn Cassis Physician: Referring Physician: Jones Broom in Treatment: 24 Debridement Performed for Wound #3 Left,Lateral Lower Leg Assessment: Performed By: Physician BURNS III, Melanie Crazier., MD Debridement: Debridement Pre-procedure Yes Verification/Time Out Taken: Start Time: 08:45 Pain Control: Other : lidocaine 4% Level: Skin/Subcutaneous Tissue Total Area Debrided (L x 11.7 (cm) x 8 (cm) = 93.6 (cm) W): Tissue and other Viable, Non-Viable, Exudate, Fat, Fibrin/Slough, Subcutaneous material debrided: Instrument: Curette Bleeding: Minimum Hemostasis Achieved: Pressure End Time: 08:51 Procedural Pain: 0 Post Procedural Pain: 0 Response to Treatment: Procedure was tolerated well Post Debridement Measurements of Total Wound Length: (cm) 11.7 Width: (cm) 8 Depth: (cm) 0.6 Volume: (cm) 44.108 Post Procedure Diagnosis Same as Pre-procedure Electronic Signature(s) Signed: 07/20/2015 8:51:38 AM By: Madelaine Bhat MD Entered By: Madelaine Bhat on 07/19/2015 09:31:25 [name], [name] ([id number]) -------------------------------------------------------------------------------- Debridement Details Patient Name: [name], [name] Date of Service: 07/19/2015 8:00 AM Medical Record Patient Account Number: [id number] Number: Treating RN: 03-Dec-1955  (60 y.o. Other Clinician: Date of Birth/Sex: Female) Treating BURNS III, Primary Care Physician/Extender: Karn Cassis Physician: Referring Physician: Jones Broom in Treatment: 24 Debridement Performed for Wound #8 Right,Posterior Lower Leg Assessment: Performed By: Physician BURNS III, Melanie Crazier., MD Debridement: Debridement Pre-procedure Yes Verification/Time Out Taken: Start Time: 08:45 Pain Control: Other : lidocaine 4% Level: Skin/Subcutaneous Tissue Total Area Debrided (L x 6 (cm) x 4 (cm) = 24 (cm) W): Tissue and other Viable, Non-Viable, Exudate, Fat, Fibrin/Slough, Skin, Subcutaneous material debrided: Instrument: Curette Bleeding: Moderate Hemostasis Achieved: Pressure End Time: 08:51 Procedural Pain: 0 Post Procedural Pain: 0 Response  to Treatment: Procedure was tolerated well Post Debridement Measurements of Total Wound Length: (cm) 7.6 Width: (cm) 6.3 Depth: (cm) 0.4 Volume: (cm) 15.042 Post Procedure Diagnosis Same as Pre-procedure Electronic Signature(s) Signed: 07/20/2015 8:51:38 AM By: Madelaine Bhat MD Entered By: Madelaine Bhat on 07/19/2015 09:31:45 [name], [name] ([id number]) -------------------------------------------------------------------------------- Debridement Details Patient Name: [name], [name] Date of Service: 07/19/2015 8:00 AM Medical Record Patient Account Number: [id number] Number: Treating RN: May 28, 1956 (60 y.o. Other Clinician: Date of Birth/Sex: Female) Treating BURNS III, Primary Care Physician/Extender: Karn Cassis Physician: Referring Physician: Jones Broom in Treatment: 24 Debridement Performed for Wound #9 Left,Posterior Lower Leg Assessment: Performed By: Physician BURNS III, Melanie Crazier., MD Debridement: Debridement Pre-procedure Yes Verification/Time Out Taken: Start Time: 08:45 Pain Control: Other : lidocaine 4% Level:  Skin/Subcutaneous Tissue Total Area Debrided (L x 2.7 (cm) x 1.2 (cm) = 3.24 (cm) W): Tissue and other Viable, Non-Viable, Exudate, Fat, Fibrin/Slough, Subcutaneous material debrided: Instrument: Curette Bleeding: Minimum Hemostasis Achieved: Pressure End Time: 08:51 Procedural Pain: 0 Post Procedural Pain: 0 Response to Treatment: Procedure was tolerated well Post Debridement Measurements of Total Wound Length: (cm) 2.7 Width: (cm) 1.2 Depth: (cm) 0.3 Volume: (cm) 0.763 Post Procedure Diagnosis Same as Pre-procedure Electronic Signature(s) Signed: 07/20/2015 8:51:38 AM By: Madelaine Bhat MD Entered By: Madelaine Bhat on 07/19/2015 09:32:04 [name], [name] ([id number]) -------------------------------------------------------------------------------- HPI Details Patient Name: [name], [name] Date of Service: 07/19/2015 8:00 AM Medical Record Patient Account Number: [id number] Number: Treating RN: [birth year] (59 y.o. Other Clinician: Date of Birth/Sex: Female) Treating BURNS III, Primary Care Physician/Extender: Karn Cassis Physician: Referring Physician: Jones Broom in Treatment: 24 History of Present Illness HPI Description: Pleasant 60 year old with h/o DM (Hgb A1c 7.4 in Aug 2016), ESRD (on hemodialysis since July 2016). Presented to her PCP Dr. Venora Maples with BLE ulcers since June 2016. 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 Left lower extremity ultrasound in May 2016 showed no evidence for DVT. No assessment for venous insufficiency. Culture 04/19/2015 grew methicillin sensitive staph aureus (sensitive to tetracycline), Stenotrophomonas maltophilia, and Enterococcus faecalis. Completed course of doxycycline. Hospitalized in Nov 2016 for worsening cellulitis. Treated with IV antibiotics. No  operative debridement or biopsy. Punch biopsy of left calf ulcer 06/28/2015 showed no evidence for malignancy. Performing dressing changes with silver alginate. Tolerating Profore light bilaterally 3x/week. She returns to clinic and reports that her legs feel better with compression. No fever or chills. Less drainage. Electronic Signature(s) Signed: 07/20/2015 8:51:38 AM By: Madelaine Bhat MD Entered By: Madelaine Bhat on 07/19/2015 09:33:08 Azucena, [name] ([id number]) -------------------------------------------------------------------------------- Physical Exam Details Patient Name: [name], [name] Date of Service: 07/19/2015 8:00 AM Medical Record Patient Account Number: [id number] Number: Treating RN: 12/24/1955 (60 y.o. Other Clinician: Date of Birth/Sex: Female) Treating BURNS III, Primary Care Physician/Extender: Karn Cassis Physician: Referring Physician: Fidel Levy Weeks in Treatment: 24 Constitutional . Pulse regular. Respirations normal and unlabored. Afebrile. . Notes Bilateral calf ulcerations improved with compression. No significant cellulitis. Less necrotic tissue. Persistent 2+ pitting edema. Faintly palpable DP bilaterally. Triphasic on the right. Biphasic on the left. Noncompressible. Electronic Signature(s) Signed: 07/20/2015 8:51:38 AM By: Madelaine Bhat MD Entered By: Madelaine Bhat on 07/19/2015 09:33:36 Minkoff, [name] ([id number]) -------------------------------------------------------------------------------- Physician Orders Details Patient Name: Loy, Evoleht H. Date of Service: 07/19/2015 8:00 AM  Medical Record Patient Account Number: [id number] Number: Treating RN: Huel Coventry 1956/02/24 (60 y.o. Other Clinician: Date of Birth/Sex: Female) Treating BURNS III, Primary Care Physician/Extender: Karn Cassis Physician: Referring Physician: Jones Broom in  Treatment: 24 Verbal / Phone Orders: Yes Clinician: Huel Coventry Read Back and Verified: Yes Diagnosis Coding Wound Cleansing Wound #1 Right,Anterior Lower Leg o Clean wound with Normal Saline. o Cleanse wound with mild soap and water o May Shower, gently pat wound dry prior to applying new dressing. Wound #11 Right Gluteus o Clean wound with Normal Saline. o Cleanse wound with mild soap and water o May Shower, gently pat wound dry prior to applying new dressing. Wound #3 Left,Lateral Lower Leg o Clean wound with Normal Saline. o Cleanse wound with mild soap and water o May Shower, gently pat wound dry prior to applying new dressing. Wound #8 Right,Posterior Lower Leg o Clean wound with Normal Saline. o Cleanse wound with mild soap and water o May Shower, gently pat wound dry prior to applying new dressing. Wound #9 Left,Posterior Lower Leg o Clean wound with Normal Saline. o Cleanse wound with mild soap and water o May Shower, gently pat wound dry prior to applying new dressing. Anesthetic Wound #1 Right,Anterior Lower Leg o Topical Lidocaine 4% cream applied to wound bed prior to debridement Wound #11 Right Gluteus o Topical Lidocaine 4% cream applied to wound bed prior to debridement Wound #3 Left,Lateral Lower Leg Helinski, Jolayne H. ([id number]) o Topical Lidocaine 4% cream applied to wound bed prior to debridement Wound #8 Right,Posterior Lower Leg o Topical Lidocaine 4% cream applied to wound bed prior to debridement Wound #9 Left,Posterior Lower Leg o Topical Lidocaine 4% cream applied to wound bed prior to debridement Skin Barriers/Peri-Wound Care Wound #1 Right,Anterior Lower Leg o Barrier cream Wound #11 Right Gluteus o Barrier cream Wound #3 Left,Lateral Lower Leg o Barrier cream Wound #8 Right,Posterior Lower Leg o Barrier cream Wound #9 Left,Posterior Lower Leg o Barrier cream Primary Wound Dressing Wound  #1 Right,Anterior Lower Leg o Aquacel Ag - or equivalent (No foam) Wound #11 Right Gluteus o Aquacel Ag - or equivalent (No foam) Wound #3 Left,Lateral Lower Leg o Aquacel Ag - or equivalent (No foam) Wound #8 Right,Posterior Lower Leg o Aquacel Ag - or equivalent (No foam) Wound #9 Left,Posterior Lower Leg o Aquacel Ag - or equivalent (No foam) Secondary Dressing Wound #1 Right,Anterior Lower Leg o ABD pad - please add ABD pad to dressing Wound #11 Right Gluteus o ABD pad - please add ABD pad to dressing Wound #3 Left,Lateral Lower Leg Cannell, Terita H. ([id number]) o ABD pad - please add ABD pad to dressing Wound #8 Right,Posterior Lower Leg o ABD pad - please add ABD pad to dressing Wound #9 Left,Posterior Lower Leg o ABD pad - please add ABD pad to dressing Dressing Change Frequency Wound #1 Right,Anterior Lower Leg o Change Dressing Monday, Wednesday, Friday - Wednesday in wound care center Wound #11 Right Gluteus o Change Dressing Monday, Wednesday, Friday - Wednesday in wound care center Wound #3 Left,Lateral Lower Leg o Change Dressing Monday, Wednesday, Friday - Wednesday in wound care center Wound #8 Right,Posterior Lower Leg o Change Dressing Monday, Wednesday, Friday - Wednesday in wound care center Wound #9 Left,Posterior Lower Leg o Change Dressing Monday, Wednesday, Friday - Wednesday in wound care center Follow-up Appointments Wound #1 Right,Anterior Lower Leg o Return Appointment in 1 week. Wound #11 Right Gluteus o Return Appointment in  1 week. Wound #3 Left,Lateral Lower Leg o Return Appointment in 1 week. Wound #8 Right,Posterior Lower Leg o Return Appointment in 1 week. Wound #9 Left,Posterior Lower Leg o Return Appointment in 1 week. Edema Control Wound #1 Right,Anterior Lower Leg o 3 Layer Compression System - Bilateral - Profore lite o Elevate legs to the level of the heart and pump ankles as often as  possible - Elevate legs at Dialysis Wound #11 Right Gluteus o 3 Layer Compression System - Bilateral - Profore lite Fiorini, Munira H. ([id number]) o Elevate legs to the level of the heart and pump ankles as often as possible - Elevate legs at Dialysis Wound #3 Left,Lateral Lower Leg o 3 Layer Compression System - Bilateral - Profore lite o Elevate legs to the level of the heart and pump ankles as often as possible - Elevate legs at Dialysis Wound #8 Right,Posterior Lower Leg o 3 Layer Compression System - Bilateral - Profore lite o Elevate legs to the level of the heart and pump ankles as often as possible - Elevate legs at Dialysis Wound #9 Left,Posterior Lower Leg o 3 Layer Compression System - Bilateral - Profore lite o Elevate legs to the level of the heart and pump ankles as often as possible - Elevate legs at Dialysis Off-Loading Wound #1 Right,Anterior Lower Leg o Turn and reposition every 2 hours Wound #11 Right Gluteus o Turn and reposition every 2 hours Wound #3 Left,Lateral Lower Leg o Turn and reposition every 2 hours Wound #8 Right,Posterior Lower Leg o Turn and reposition every 2 hours Wound #9 Left,Posterior Lower Leg o Turn and reposition every 2 hours Additional Orders / Instructions Wound #1 Right,Anterior Lower Leg o Activity as tolerated Wound #11 Right Gluteus o Activity as tolerated Wound #3 Left,Lateral Lower Leg o Activity as tolerated Wound #8 Right,Posterior Lower Leg o Activity as tolerated Wound #9 Left,Posterior Lower Leg o Activity as tolerated Krupp, Emaly H. ([id number]) Home Health Wound #1 Right,Anterior Lower Leg o Continue Home Health Visits - Advanced: Monday, Wednesday and Friday **WRAP GOES FROM BASE OF TOES TO THREE FINGER WIDTHS BENEATH BEND IN KNEE. o Home Health Nurse may visit PRN to address patientos wound care needs. o FACE TO FACE ENCOUNTER: MEDICARE and MEDICAID PATIENTS: I  certify that this patient is under my care and that I had a face-to-face encounter that meets the physician face-to-face encounter requirements with this patient on this date. The encounter with the patient was in whole or in part for the following MEDICAL CONDITION: (primary reason for Home Healthcare) MEDICAL NECESSITY: I certify, that based on my findings, NURSING services are a medically necessary home health service. HOME BOUND STATUS: I certify that my clinical findings support that this patient is homebound (i.e., Due to illness or injury, pt requires aid of supportive devices such as crutches, cane, wheelchairs, walkers, the use of special transportation or the assistance of another person to leave their place of residence. There is a normal inability to leave the home and doing so requires considerable and taxing effort. Other absences are for medical reasons / religious services and are infrequent or of short duration when for other reasons). o If current dressing causes regression in wound condition, may D/C ordered dressing product/s and apply Normal Saline Moist Dressing daily until next Wound Healing Center / Other MD appointment. Notify Wound Healing Center of regression in wound condition at [phone]. o Please direct any NON-WOUND related issues/requests for orders to patient's Primary Care Physician Wound #11  Right Gluteus o Continue Home Health Visits - Advanced: Monday, Wednesday and Friday **WRAP GOES FROM BASE OF TOES TO THREE FINGER WIDTHS BENEATH BEND IN KNEE. o Home Health Nurse may visit PRN to address patientos wound care needs. o FACE TO FACE ENCOUNTER: MEDICARE and MEDICAID PATIENTS: I certify that this patient is under my care and that I had a face-to-face encounter that meets the physician face-to-face encounter requirements with this patient on this date. The encounter with the patient was in whole or in part for the following MEDICAL CONDITION:  (primary reason for Home Healthcare) MEDICAL NECESSITY: I certify, that based on my findings, NURSING services are a medically necessary home health service. HOME BOUND STATUS: I certify that my clinical findings support that this patient is homebound (i.e., Due to illness or injury, pt requires aid of supportive devices such as crutches, cane, wheelchairs, walkers, the use of special transportation or the assistance of another person to leave their place of residence. There is a normal inability to leave the home and doing so requires considerable and taxing effort. Other absences are for medical reasons / religious services and are infrequent or of short duration when for other reasons). o If current dressing causes regression in wound condition, may D/C ordered dressing product/s and apply Normal Saline Moist Dressing daily until next Wound Healing Center / Other MD appointment. Notify Wound Healing Center of regression in wound condition at [phone]. o Please direct any NON-WOUND related issues/requests for orders to patient's Primary Care Physician Wound #3 Left,Lateral Lower Leg o Continue Home Health Visits - Advanced: Monday, Wednesday and Friday **WRAP GOES FROM BASE OF TOES TO THREE FINGER WIDTHS BENEATH BEND IN KNEE. o Home Health Nurse may visit PRN to address patientos wound care needs. VIRIDIANA, SPAID ([id number]) o FACE TO FACE ENCOUNTER: MEDICARE and MEDICAID PATIENTS: I certify that this patient is under my care and that I had a face-to-face encounter that meets the physician face-to-face encounter requirements with this patient on this date. The encounter with the patient was in whole or in part for the following MEDICAL CONDITION: (primary reason for Home Healthcare) MEDICAL NECESSITY: I certify, that based on my findings, NURSING services are a medically necessary home health service. HOME BOUND STATUS: I certify that my clinical findings support that  this patient is homebound (i.e., Due to illness or injury, pt requires aid of supportive devices such as crutches, cane, wheelchairs, walkers, the use of special transportation or the assistance of another person to leave their place of residence. There is a normal inability to leave the home and doing so requires considerable and taxing effort. Other absences are for medical reasons / religious services and are infrequent or of short duration when for other reasons). o If current dressing causes regression in wound condition, may D/C ordered dressing product/s and apply Normal Saline Moist Dressing daily until next Wound Healing Center / Other MD appointment. Notify Wound Healing Center of regression in wound condition at [phone]. o Please direct any NON-WOUND related issues/requests for orders to patient's Primary Care Physician Wound #8 Right,Posterior Lower Leg o Continue Home Health Visits - Advanced: Monday, Wednesday and Friday **WRAP GOES FROM BASE OF TOES TO THREE FINGER WIDTHS BENEATH BEND IN KNEE. o Home Health Nurse may visit PRN to address patientos wound care needs. o FACE TO FACE ENCOUNTER: MEDICARE and MEDICAID PATIENTS: I certify that this patient is under my care and that I had a face-to-face encounter that meets the physician face-to-face  encounter requirements with this patient on this date. The encounter with the patient was in whole or in part for the following MEDICAL CONDITION: (primary reason for Home Healthcare) MEDICAL NECESSITY: I certify, that based on my findings, NURSING services are a medically necessary home health service. HOME BOUND STATUS: I certify that my clinical findings support that this patient is homebound (i.e., Due to illness or injury, pt requires aid of supportive devices such as crutches, cane, wheelchairs, walkers, the use of special transportation or the assistance of another person to leave their place of residence. There is  a normal inability to leave the home and doing so requires considerable and taxing effort. Other absences are for medical reasons / religious services and are infrequent or of short duration when for other reasons). o If current dressing causes regression in wound condition, may D/C ordered dressing product/s and apply Normal Saline Moist Dressing daily until next Wound Healing Center / Other MD appointment. Notify Wound Healing Center of regression in wound condition at [phone]. o Please direct any NON-WOUND related issues/requests for orders to patient's Primary Care Physician Wound #9 Left,Posterior Lower Leg o Continue Home Health Visits - Advanced: Monday, Wednesday and Friday **WRAP GOES FROM BASE OF TOES TO THREE FINGER WIDTHS BENEATH BEND IN KNEE. o Home Health Nurse may visit PRN to address patientos wound care needs. o FACE TO FACE ENCOUNTER: MEDICARE and MEDICAID PATIENTS: I certify that this patient is under my care and that I had a face-to-face encounter that meets the physician face-to-face encounter requirements with this patient on this date. The encounter with the patient was in whole or in part for the following MEDICAL CONDITION: (primary reason for Home Healthcare) MEDICAL NECESSITY: I certify, that based on my findings, NURSING services are a medically necessary home health service. HOME BOUND STATUS: I certify that my clinical findings Klang, Diondra H. ([id number]) support that this patient is homebound (i.e., Due to illness or injury, pt requires aid of supportive devices such as crutches, cane, wheelchairs, walkers, the use of special transportation or the assistance of another person to leave their place of residence. There is a normal inability to leave the home and doing so requires considerable and taxing effort. Other absences are for medical reasons / religious services and are infrequent or of short duration when for other reasons). o If  current dressing causes regression in wound condition, may D/C ordered dressing product/s and apply Normal Saline Moist Dressing daily until next Wound Healing Center / Other MD appointment. Notify Wound Healing Center of regression in wound condition at [phone]. o Please direct any NON-WOUND related issues/requests for orders to patient's Primary Care Physician Electronic Signature(s) Signed: 07/19/2015 5:28:49 PM By: Elliot Gurney RN, BSN, Kim RN, BSN Signed: 07/20/2015 8:51:38 AM By: Madelaine Bhat MD Entered By: Elliot Gurney RN, BSN, Kim on 07/19/2015 08:56:53 Colligan, [name] ([id number]) -------------------------------------------------------------------------------- Problem List Details Patient Name: Sanzo, Kamera H. Date of Service: 07/19/2015 8:00 AM Medical Record Patient Account Number: [id number] Number: Treating RN: 11/25/1955 (60 y.o. Other Clinician: Date of Birth/Sex: Female) Treating BURNS III, Primary Care Physician/Extender: Karn Cassis Physician: Referring Physician: Jones Broom in Treatment: 24 Active Problems ICD-10 Encounter Code Description Active Date Diagnosis E11.622 Type 2 diabetes mellitus with other skin ulcer 01/30/2015 Yes N18.6 End stage renal disease 01/30/2015 Yes L97.222 Non-pressure chronic ulcer of left calf with fat layer 01/30/2015 Yes exposed L97.212 Non-pressure chronic ulcer of right calf with fat layer 01/30/2015 Yes exposed R60.0  Localized edema 04/19/2015 Yes I89.0 Lymphedema, not elsewhere classified 07/12/2015 Yes Inactive Problems Resolved Problems Electronic Signature(s) Signed: 07/20/2015 8:51:38 AM By: Madelaine Bhat MD Entered By: Madelaine Bhat on 07/19/2015 09:30:39 Knoles, Alylah Rexene [name] ([id number]) -------------------------------------------------------------------------------- Progress Note Details Patient Name: Buechner, Zahriah H. Date of Service: 07/19/2015 8:00 AM Medical Record  Patient Account Number: [id number] Number: Treating RN: 06-04-1956 (59 y.o. Other Clinician: Date of Birth/Sex: Female) Treating BURNS III, Primary Care Physician/Extender: Karn Cassis Physician: Referring Physician: Jones Broom in Treatment: 24 Subjective Chief Complaint Information obtained from Patient Chronic bilateral calf ulcers. History of Present Illness (HPI) Pleasant 60 year old with h/o DM (Hgb A1c 7.4 in Aug 2016), ESRD (on hemodialysis since July 2016). Presented to her PCP Dr. Venora Maples with BLE ulcers since June 2016. 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 Left lower extremity ultrasound in May 2016 showed no evidence for DVT. No assessment for venous insufficiency. Culture 04/19/2015 grew methicillin sensitive staph aureus (sensitive to tetracycline), Stenotrophomonas maltophilia, and Enterococcus faecalis. Completed course of doxycycline. Hospitalized in Nov 2016 for worsening cellulitis. Treated with IV antibiotics. No operative debridement or biopsy. Punch biopsy of left calf ulcer 06/28/2015 showed no evidence for malignancy. Performing dressing changes with silver alginate. Tolerating Profore light bilaterally 3x/week. She returns to clinic and reports that her legs feel better with compression. No fever or chills. Less drainage. Objective Constitutional Pulse regular. Respirations normal and unlabored. Afebrile. Vitals Time Taken: 8:09 AM, Height: 68 in, Weight: 294 lbs, BMI: 44.7, Temperature: 97.7 F, Pulse: 96 bpm, Respiratory Rate: 18 breaths/min, Blood Pressure: 129/69 mmHg. Schwarzkopf, Delorese HMarland Kitchen ([id number]) General Notes: Bilateral calf ulcerations improved with compression. No significant cellulitis. Less necrotic tissue. Persistent 2+ pitting edema. Faintly palpable DP bilaterally. Triphasic on  the right. Biphasic on the left. Noncompressible. Integumentary (Hair, Skin) Wound #1 status is Open. Original cause of wound was Gradually Appeared. The wound is located on the Right,Anterior Lower Leg. The wound measures 18cm length x 18cm width x 0.2cm depth; 254.469cm^2 area and 50.894cm^3 volume. The wound is limited to skin breakdown. There is no tunneling or undermining noted. There is a large amount of serous drainage noted. The wound margin is thickened. There is small (1-33%) pink granulation within the wound bed. There is a large (67-100%) amount of necrotic tissue within the wound bed including Eschar and Adherent Slough. The periwound skin appearance exhibited: Scarring, Moist. The periwound skin appearance did not exhibit: Callus, Crepitus, Excoriation, Fluctuance, Friable, Induration, Localized Edema, Rash, Dry/Scaly, Maceration, Atrophie Blanche, Cyanosis, Ecchymosis, Hemosiderin Staining, Mottled, Pallor, Rubor, Erythema. Periwound temperature was noted as No Abnormality. The periwound has tenderness on palpation. Wound #10 status is Healed - Epithelialized. Original cause of wound was Gradually Appeared. The wound is located on the Left Gluteus. The wound measures 0cm length x 0cm width x 0cm depth; 0cm^2 area and 0cm^3 volume. There is no tunneling or undermining noted. There is a none present amount of drainage noted. There is no granulation within the wound bed. There is no necrotic tissue within the wound bed. Wound #11 status is Open. Original cause of wound was Gradually Appeared. The wound is located on the Right Gluteus. The wound measures 0cm length x 0cm width x 0cm depth; 0cm^2 area and 0cm^3 volume. There is no tunneling or undermining noted. There is a none present amount of drainage noted. The wound margin is flat and  intact. There is no granulation within the wound bed. There is no necrotic tissue within the wound bed. The periwound skin appearance did not  exhibit: Dry/Scaly, Maceration, Moist. Periwound temperature was noted as No Abnormality. The periwound has tenderness on palpation. Wound #3 status is Open. Original cause of wound was Gradually Appeared. The wound is located on the Left,Lateral Lower Leg. The wound measures 11.7cm length x 8cm width x 0.5cm depth; 73.513cm^2 area and 36.757cm^3 volume. The wound is limited to skin breakdown. There is no tunneling or undermining noted. There is a large amount of serous drainage noted. The wound margin is flat and intact. There is medium (34-66%) red granulation within the wound bed. There is a medium (34-66%) amount of necrotic tissue within the wound bed including Adherent Slough. The periwound skin appearance exhibited: Scarring, Moist. The periwound skin appearance did not exhibit: Callus, Crepitus, Excoriation, Fluctuance, Friable, Induration, Localized Edema, Rash, Dry/Scaly, Maceration, Atrophie Blanche, Cyanosis, Ecchymosis, Hemosiderin Staining, Mottled, Pallor, Rubor, Erythema. Periwound temperature was noted as No Abnormality. The periwound has tenderness on palpation. Wound #8 status is Open. Original cause of wound was Gradually Appeared. The wound is located on the Right,Posterior Lower Leg. The wound measures 7.6cm length x 6.3cm width x 0.3cm depth; 37.605cm^2 area and 11.281cm^3 volume. The wound is limited to skin breakdown. There is no tunneling or undermining noted. There is a large amount of serous drainage noted. The wound margin is flat and intact. There is medium (34-66%) red granulation within the wound bed. There is a medium (34-66%) amount of necrotic tissue within the wound bed including Eschar and Adherent Slough. The periwound skin appearance exhibited: Localized Edema, Moist. The periwound skin appearance did not exhibit: Callus, Crepitus, Excoriation, Fluctuance, Friable, Induration, Rash, Scarring, Dry/Scaly, Maceration, Atrophie Blanche, Cyanosis, Ecchymosis,  Hemosiderin Staining, Mottled, Pallor, Rubor, Erythema. Periwound temperature was noted as No Abnormality. The periwound has tenderness on palpation. Wound #9 status is Open. Original cause of wound was Gradually Appeared. The wound is located on the [facility], KENYON ESHLEMAN. ([id number]) Left,Posterior Lower Leg. The wound measures 2.7cm length x 1.2cm width x 0.2cm depth; 2.545cm^2 area and 0.509cm^3 volume. There is no tunneling or undermining noted. There is a large amount of serous drainage noted. The wound margin is thickened. There is no granulation within the wound bed. There is a large (67-100%) amount of necrotic tissue within the wound bed including Adherent Slough. The periwound skin appearance exhibited: Scarring, Moist. Periwound temperature was noted as No Abnormality. The periwound has tenderness on palpation. Assessment Active Problems ICD-10 E11.622 - Type 2 diabetes mellitus with other skin ulcer N18.6 - End stage renal disease L97.222 - Non-pressure chronic ulcer of left calf with fat layer exposed L97.212 - Non-pressure chronic ulcer of right calf with fat layer exposed R60.0 - Localized edema I89.0 - Lymphedema, not elsewhere classified Bilateral lower extremity phlebolymphedema and ulcerations. 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 There was a Skin/Subcutaneous Tissue Debridement ([id number]) debridement with total area of 48 sq cm performed by BURNS III, Melanie Crazier., MD. with the following instrument(s): Curette to remove Viable and Non-Viable tissue/material including Exudate, Fat, Fibrin/Slough, and Subcutaneous after achieving pain control using Other (lidocaine 4%). A time out was conducted prior to the start of the procedure. A Minimum amount of bleeding was controlled with Pressure. The procedure was tolerated well with a pain level of 0 throughout and a pain level of 0 following the procedure. Post  Debridement  Measurements: 18cm length x 18cm width x 0.3cm depth; 76.341cm^3 volume. 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 There was a Skin/Subcutaneous Tissue Debridement ([id number]) debridement with total area of 93.6 sq cm performed by BURNS III, Melanie Crazier., MD. with the following instrument(s): Curette to remove Viable and Non-Viable tissue/material including Exudate, Fat, Fibrin/Slough, and Subcutaneous after achieving pain control using Other (lidocaine 4%). A time out was conducted prior to the start of the procedure. A Minimum amount of bleeding was Broberg, Keyanni H. ([id number]) controlled with Pressure. The procedure was tolerated well with a pain level of 0 throughout and a pain level of 0 following the procedure. Post Debridement Measurements: 11.7cm length x 8cm width x 0.6cm depth; 44.108cm^3 volume. 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 There was a Skin/Subcutaneous Tissue Debridement ([id number]) debridement with total area of 24 sq cm performed by BURNS III, Melanie Crazier., MD. with the following instrument(s): Curette to remove Viable and Non-Viable tissue/material including Exudate, Fat, Fibrin/Slough, Skin, and Subcutaneous after achieving pain control using Other (lidocaine 4%). A time out was conducted prior to the start of the procedure. A Moderate amount of bleeding was controlled with Pressure. The procedure was tolerated well with a pain level of 0 throughout and a pain level of 0 following the procedure. Post Debridement Measurements: 7.6cm length x 6.3cm width x 0.4cm depth; 15.042cm^3 volume. 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 There was a Skin/Subcutaneous Tissue Debridement  ([id number]) debridement with total area of 3.24 sq cm performed by BURNS III, Melanie Crazier., MD. with the following instrument(s): Curette to remove Viable and Non-Viable tissue/material including Exudate, Fat, Fibrin/Slough, and Subcutaneous after achieving pain control using Other (lidocaine 4%). A time out was conducted prior to the start of the procedure. A Minimum amount of bleeding was controlled with Pressure. The procedure was tolerated well with a pain level of 0 throughout and a pain level of 0 following the procedure. Post Debridement Measurements: 2.7cm length x 1.2cm width x 0.3cm depth; 0.763cm^3 volume. Post procedure Diagnosis Wound #9: Same as Pre-Procedure Plan Wound Cleansing: Wound #1 Right,Anterior Lower Leg: Clean wound with Normal Saline. Cleanse wound with mild soap and water May Shower, gently pat wound dry prior to applying new dressing. Wound #11 Right Gluteus: Clean wound with Normal Saline. Cleanse wound with mild soap and water May Shower, gently pat wound dry prior to applying new dressing. Wound #3 Left,Lateral Lower Leg: Clean wound with Normal Saline. Cleanse wound with mild soap and water May Shower, gently pat wound dry prior to applying new dressing. Wound #8 Right,Posterior Lower Leg: Coran, Merelyn H. ([id number]) Clean wound with Normal Saline. Cleanse wound with mild soap and water May Shower, gently pat wound dry prior to applying new dressing. Wound #9 Left,Posterior Lower Leg: Clean wound with Normal Saline. Cleanse wound with mild soap and water May Shower, gently pat wound dry prior to applying new dressing. Anesthetic: Wound #1 Right,Anterior Lower Leg: Topical Lidocaine 4% cream applied to wound bed prior to debridement Wound #11 Right Gluteus: Topical Lidocaine 4% cream applied to wound bed prior to debridement Wound #3 Left,Lateral Lower Leg: Topical Lidocaine 4% cream applied to wound bed prior to debridement Wound #8  Right,Posterior Lower Leg: Topical  Lidocaine 4% cream applied to wound bed prior to debridement Wound #9 Left,Posterior Lower Leg: Topical Lidocaine 4% cream applied to wound bed prior to debridement Skin Barriers/Peri-Wound Care: Wound #1 Right,Anterior Lower Leg: Barrier cream Wound #11 Right Gluteus: Barrier cream Wound #3 Left,Lateral Lower Leg: Barrier cream Wound #8 Right,Posterior Lower Leg: Barrier cream Wound #9 Left,Posterior Lower Leg: Barrier cream Primary Wound Dressing: Wound #1 Right,Anterior Lower Leg: Aquacel Ag - or equivalent (No foam) Wound #11 Right Gluteus: Aquacel Ag - or equivalent (No foam) Wound #3 Left,Lateral Lower Leg: Aquacel Ag - or equivalent (No foam) Wound #8 Right,Posterior Lower Leg: Aquacel Ag - or equivalent (No foam) Wound #9 Left,Posterior Lower Leg: Aquacel Ag - or equivalent (No foam) Secondary Dressing: Wound #1 Right,Anterior Lower Leg: ABD pad - please add ABD pad to dressing Wound #11 Right Gluteus: ABD pad - please add ABD pad to dressing Wound #3 Left,Lateral Lower Leg: ABD pad - please add ABD pad to dressing Wound #8 Right,Posterior Lower Leg: ABD pad - please add ABD pad to dressing Wound #9 Left,Posterior Lower Leg: ABD pad - please add ABD pad to dressing Alleman, Palyn H. ([id number]) Dressing Change Frequency: Wound #1 Right,Anterior Lower Leg: Change Dressing Monday, Wednesday, Friday - Wednesday in wound care center Wound #11 Right Gluteus: Change Dressing Monday, Wednesday, Friday - Wednesday in wound care center Wound #3 Left,Lateral Lower Leg: Change Dressing Monday, Wednesday, Friday - Wednesday in wound care center Wound #8 Right,Posterior Lower Leg: Change Dressing Monday, Wednesday, Friday - Wednesday in wound care center Wound #9 Left,Posterior Lower Leg: Change Dressing Monday, Wednesday, Friday - Wednesday in wound care center Follow-up Appointments: Wound #1 Right,Anterior Lower Leg: Return  Appointment in 1 week. Wound #11 Right Gluteus: Return Appointment in 1 week. Wound #3 Left,Lateral Lower Leg: Return Appointment in 1 week. Wound #8 Right,Posterior Lower Leg: Return Appointment in 1 week. Wound #9 Left,Posterior Lower Leg: Return Appointment in 1 week. Edema Control: Wound #1 Right,Anterior Lower Leg: 3 Layer Compression System - Bilateral - Profore lite Elevate legs to the level of the heart and pump ankles as often as possible - Elevate legs at Dialysis Wound #11 Right Gluteus: 3 Layer Compression System - Bilateral - Profore lite Elevate legs to the level of the heart and pump ankles as often as possible - Elevate legs at Dialysis Wound #3 Left,Lateral Lower Leg: 3 Layer Compression System - Bilateral - Profore lite Elevate legs to the level of the heart and pump ankles as often as possible - Elevate legs at Dialysis Wound #8 Right,Posterior Lower Leg: 3 Layer Compression System - Bilateral - Profore lite Elevate legs to the level of the heart and pump ankles as often as possible - Elevate legs at Dialysis Wound #9 Left,Posterior Lower Leg: 3 Layer Compression System - Bilateral - Profore lite Elevate legs to the level of the heart and pump ankles as often as possible - Elevate legs at Dialysis Off-Loading: Wound #1 Right,Anterior Lower Leg: Turn and reposition every 2 hours Wound #11 Right Gluteus: Turn and reposition every 2 hours Wound #3 Left,Lateral Lower Leg: Turn and reposition every 2 hours Wound #8 Right,Posterior Lower Leg: Turn and reposition every 2 hours Wound #9 Left,Posterior Lower Leg: Turn and reposition every 2 hours Additional Orders / Instructions: Wound #1 Right,Anterior Lower Leg: Abascal, Justina H. ([id number]) Activity as tolerated Wound #11 Right Gluteus: Activity as tolerated Wound #3 Left,Lateral Lower Leg: Activity as tolerated Wound #8 Right,Posterior Lower Leg: Activity as tolerated Wound #  9 Left,Posterior Lower  Leg: Activity as tolerated Home Health: Wound #1 Right,Anterior Lower Leg: Continue Home Health Visits - [facility]: Monday, Wednesday and Friday **WRAP GOES FROM BASE OF TOES TO THREE FINGER WIDTHS BENEATH BEND IN KNEE. Home Health Nurse may visit PRN to address patient s wound care needs. FACE TO FACE ENCOUNTER: MEDICARE and MEDICAID PATIENTS: I certify that this patient is under my care and that I had a face-to-face encounter that meets the physician face-to-face encounter requirements with this patient on this date. The encounter with the patient was in whole or in part for the following MEDICAL CONDITION: (primary reason for Home Healthcare) MEDICAL NECESSITY: I certify, that based on my findings, NURSING services are a medically necessary home health service. HOME BOUND STATUS: I certify that my clinical findings support that this patient is homebound (i.e., Due to illness or injury, pt requires aid of supportive devices such as crutches, cane, wheelchairs, walkers, the use of special transportation or the assistance of another person to leave their place of residence. There is a normal inability to leave the home and doing so requires considerable and taxing effort. Other absences are for medical reasons / religious services and are infrequent or of short duration when for other reasons). If current dressing causes regression in wound condition, may D/C ordered dressing product/s and apply Normal Saline Moist Dressing daily until next Wound Healing Center / Other MD appointment. Notify Wound Healing Center of regression in wound condition at [phone]. Please direct any NON-WOUND related issues/requests for orders to patient's Primary Care Physician Wound #11 Right Gluteus: Continue Home Health Visits - [facility]: Monday, Wednesday and Friday **WRAP GOES FROM BASE OF TOES TO THREE FINGER WIDTHS BENEATH BEND IN KNEE. Home Health Nurse may visit PRN to address patient s wound care  needs. FACE TO FACE ENCOUNTER: MEDICARE and MEDICAID PATIENTS: I certify that this patient is under my care and that I had a face-to-face encounter that meets the physician face-to-face encounter requirements with this patient on this date. The encounter with the patient was in whole or in part for the following MEDICAL CONDITION: (primary reason for Home Healthcare) MEDICAL NECESSITY: I certify, that based on my findings, NURSING services are a medically necessary home health service. HOME BOUND STATUS: I certify that my clinical findings support that this patient is homebound (i.e., Due to illness or injury, pt requires aid of supportive devices such as crutches, cane, wheelchairs, walkers, the use of special transportation or the assistance of another person to leave their place of residence. There is a normal inability to leave the home and doing so requires considerable and taxing effort. Other absences are for medical reasons / religious services and are infrequent or of short duration when for other reasons). If current dressing causes regression in wound condition, may D/C ordered dressing product/s and apply Normal Saline Moist Dressing daily until next Wound Healing Center / Other MD appointment. Notify Wound Healing Center of regression in wound condition at [phone]. Please direct any NON-WOUND related issues/requests for orders to patient's Primary Care Physician Wound #3 Left,Lateral Lower Leg: Continue Home Health Visits - [facility]: Monday, Wednesday and Friday **WRAP GOES FROM BASE OF TOES TO THREE FINGER WIDTHS BENEATH BEND IN KNEE. Home Health Nurse may visit PRN to address patient s wound care needs. FACE TO FACE ENCOUNTER: MEDICARE and MEDICAID PATIENTS: I certify that this patient is under Mathia, Ayah H. ([id number]) my care and that I had a face-to-face encounter that meets the physician  face-to-face encounter requirements with this patient on this date. The  encounter with the patient was in whole or in part for the following MEDICAL CONDITION: (primary reason for Home Healthcare) MEDICAL NECESSITY: I certify, that based on my findings, NURSING services are a medically necessary home health service. HOME BOUND STATUS: I certify that my clinical findings support that this patient is homebound (i.e., Due to illness or injury, pt requires aid of supportive devices such as crutches, cane, wheelchairs, walkers, the use of special transportation or the assistance of another person to leave their place of residence. There is a normal inability to leave the home and doing so requires considerable and taxing effort. Other absences are for medical reasons / religious services and are infrequent or of short duration when for other reasons). If current dressing causes regression in wound condition, may D/C ordered dressing product/s and apply Normal Saline Moist Dressing daily until next Wound Healing Center / Other MD appointment. Notify Wound Healing Center of regression in wound condition at [phone]. Please direct any NON-WOUND related issues/requests for orders to patient's Primary Care Physician Wound #8 Right,Posterior Lower Leg: Continue Home Health Visits - [facility]: Monday, Wednesday and Friday **WRAP GOES FROM BASE OF TOES TO THREE FINGER WIDTHS BENEATH BEND IN KNEE. Home Health Nurse may visit PRN to address patient s wound care needs. FACE TO FACE ENCOUNTER: MEDICARE and MEDICAID PATIENTS: I certify that this patient is under my care and that I had a face-to-face encounter that meets the physician face-to-face encounter requirements with this patient on this date. The encounter with the patient was in whole or in part for the following MEDICAL CONDITION: (primary reason for Home Healthcare) MEDICAL NECESSITY: I certify, that based on my findings, NURSING services are a medically necessary home health service. HOME BOUND STATUS: I certify that  my clinical findings support that this patient is homebound (i.e., Due to illness or injury, pt requires aid of supportive devices such as crutches, cane, wheelchairs, walkers, the use of special transportation or the assistance of another person to leave their place of residence. There is a normal inability to leave the home and doing so requires considerable and taxing effort. Other absences are for medical reasons / religious services and are infrequent or of short duration when for other reasons). If current dressing causes regression in wound condition, may D/C ordered dressing product/s and apply Normal Saline Moist Dressing daily until next Wound Healing Center / Other MD appointment. Notify Wound Healing Center of regression in wound condition at [phone]. Please direct any NON-WOUND related issues/requests for orders to patient's Primary Care Physician Wound #9 Left,Posterior Lower Leg: Continue Home Health Visits - [facility]: Monday, Wednesday and Friday **WRAP GOES FROM BASE OF TOES TO THREE FINGER WIDTHS BENEATH BEND IN KNEE. Home Health Nurse may visit PRN to address patient s wound care needs. FACE TO FACE ENCOUNTER: MEDICARE and MEDICAID PATIENTS: I certify that this patient is under my care and that I had a face-to-face encounter that meets the physician face-to-face encounter requirements with this patient on this date. The encounter with the patient was in whole or in part for the following MEDICAL CONDITION: (primary reason for Home Healthcare) MEDICAL NECESSITY: I certify, that based on my findings, NURSING services are a medically necessary home health service. HOME BOUND STATUS: I certify that my clinical findings support that this patient is homebound (i.e., Due to illness or injury, pt requires aid of supportive devices such as crutches, cane, wheelchairs, walkers, the  use of special transportation or the assistance of another person to leave their place of residence.  There is a normal inability to leave the home and doing so requires considerable and taxing effort. Other absences are for medical reasons / religious services and are infrequent or of short duration when for other reasons). If current dressing causes regression in wound condition, may D/C ordered dressing product/s and apply Normal Saline Moist Dressing daily until next [facility] / Other MD appointment. Notify [facility] of regression in wound condition at [phone]. Please direct any NON-WOUND related issues/requests for orders to patient's Primary Care Physician [name], Mairin H. ([id number]) Continue with silver alginate and Profore light compression bandage 3 times weekly. Bilateral lower extremity venous ultrasound to assess for chronic venous insufficiency. Electronic Signature(s) Signed: 07/20/2015 8:51:38 AM By: Madelaine BhatBurns, III, Mccormick Macon MD Entered By: Madelaine BhatBurns, III, Tegan Burnside on 07/19/2015 09:34:22 Sunderlin, [name] EdelmanPAMELA H. ([id number]) -------------------------------------------------------------------------------- SuperBill Details Patient Name: Weisensel, Abbiegail H. Date of Service: 07/19/2015 Medical Record Patient Account Number: [id number] Number: Treating RN: Jul 25, 1955 (60 y.o. 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 R60.0 Localized edema I89.0 Lymphedema, not elsewhere classified Facility Procedures CPT4 Code: [id number] Description: 11042 - DEB SUBQ TISSUE 20 SQ CM/< ICD-10 Description Diagnosis L97.222 Non-pressure chronic  ulcer of left calf with fat l L97.212 Non-pressure chronic ulcer of right calf with fat Modifier: ayer exposed layer exposed Quantity: 1 CPT4 Code: [id number] Description: 11045 - DEB SUBQ TISS EA ADDL 20CM ICD-10 Description Diagnosis L97.222 Non-pressure chronic ulcer of left calf with fat l L97.212 Non-pressure chronic ulcer of right calf with fat Modifier: ayer exposed layer exposed Quantity: 8 Physician Procedures CPT4 Code: [id number] Description: 11042 - WC PHYS SUBQ TISS 20 SQ CM ICD-10 Description Diagnosis L97.222 Non-pressure chronic ulcer of left calf with fat la L97.212 Non-pressure chronic ulcer of right calf with fat l Modifier: yer exposed ayer exposed Quantity: 1 CPT4 Code: [id number] Wiswell, PAM Description: 11045 - WC PHYS SUBQ TISS EA ADDL 20 CM ELA H. ([id number]) Modifier: Quantity: 8 Electronic Signature(s) Signed: 07/20/2015 8:51:38 AM By: Madelaine BhatBurns, III, Kyaira Trantham MD Entered By: Madelaine BhatBurns, III, Keino Placencia on 07/19/2015 09:34:44

## 2015-07-20 NOTE — Progress Notes (Signed)
[name], [name] ([id number]) Visit Report for 07/19/2015 Arrival Information Details Patient Name: [name], [name]. Date of Service: 07/19/2015 8:00 AM Medical Record Patient Account Number: [id number] Number: Treating RN: Phillis Haggis 04-28-1956 (59 y.o. Other Clinician: Date of Birth/Sex: Female) Treating BURNS III, Primary Care Physician: Fidel Levy Physician/Extender: Zollie Beckers Referring Physician: Jones Broom in Treatment: 24 Visit Information History Since Last Visit All ordered tests and consults were completed: No Patient Arrived: Ambulatory Added or deleted any medications: No Arrival Time: 08:08 Any new allergies or adverse reactions: No Accompanied By: self Had a fall or experienced change in No Transfer Assistance: None activities of daily living that may affect Patient Identification Verified: Yes risk of falls: Secondary Verification Process Yes Signs or symptoms of abuse/neglect since last No Completed: visito Patient Requires Transmission- No Hospitalized since last visit: No Based Precautions: Pain Present Now: No Patient Has Alerts: Yes Patient Alerts: DMII ABI [place] Bilateral Electronic Signature(s) Signed: 07/19/2015 5:03:03 PM By: Alejandro Mulling Entered By: Alejandro Mulling on 07/19/2015 08:09:19 Mesick, Tinna Rexene Edison ([id number]) -------------------------------------------------------------------------------- Encounter Discharge Information Details Patient Name: [name], [name] Date of Service: 07/19/2015 8:00 AM Medical Record Patient Account Number: [id number] Number: Treating RN: Huel Coventry Feb 05, 1956 (59 y.o. Other Clinician: Date of Birth/Sex: Female) Treating BURNS III, Primary Care Physician: Fidel Levy Physician/Extender: Zollie Beckers Referring Physician: Jones Broom in Treatment: 24 Encounter Discharge Information Items Discharge Pain Level: 0 Discharge Condition:  Stable Ambulatory Status: Ambulatory Discharge Destination: Home Transportation: Private Auto Accompanied By: home Schedule Follow-up Appointment: Yes Medication Reconciliation completed and provided to Patient/Care Yes Markel Mergenthaler: Provided on Clinical Summary of Care: 07/19/2015 Form Type Recipient Paper Patient PM Electronic Signature(s) Signed: 07/19/2015 9:31:58 AM By: Gwenlyn Perking Entered By: Gwenlyn Perking on 07/19/2015 09:31:57 [name], [name] ([id number]) -------------------------------------------------------------------------------- Lower Extremity Assessment Details Patient Name: [name], [name] Date of Service: 07/19/2015 8:00 AM Medical Record Patient Account Number: [id number] Number: Treating RN: Phillis Haggis [birth year] (59 y.o. Other Clinician: Date of Birth/Sex: Female) Treating BURNS III, Primary Care Physician: Fidel Levy Physician/Extender: Zollie Beckers Referring Physician: Jones Broom in Treatment: 24 Edema Assessment Assessed: [Left: No] [Right: No] Edema: [Left: Yes] [Right: Yes] Calf Left: Right: Point of Measurement: 34 cm From Medial Instep 47.3 cm cm Ankle Left: Right: Point of Measurement: 10 cm From Medial Instep 25.4 cm cm Vascular Assessment Pulses: Posterior Tibial Dorsalis Pedis Palpable: [Left:Yes] [Right:Yes] Extremity colors, hair growth, and conditions: Extremity Color: [Left:Red] [Right:Red] Hair Growth on Extremity: [Left:No] [Right:No] Temperature of Extremity: [Left:Warm] [Right:Warm] Capillary Refill: [Left:< 3 seconds] [Right:< 3 seconds] Toe Nail Assessment Left: Right: Thick: No No Discolored: No No Deformed: No No Improper Length and Hygiene: No No Electronic Signature(s) Signed: 07/19/2015 5:03:03 PM By: Alejandro Mulling Entered By: Alejandro Mulling on 07/19/2015 08:23:57 [name], [name] ([id number]) [name], [name]  ([id number]) -------------------------------------------------------------------------------- Multi Wound Chart Details Patient Name: [name], [name] Date of Service: 07/19/2015 8:00 AM Medical Record Patient Account Number: [id number] Number: Treating RN: Phillis Haggis [birth year] (59 y.o. Other Clinician: Date of Birth/Sex: Female) Treating BURNS III, Primary Care Physician: Fidel Levy Physician/Extender: Zollie Beckers Referring Physician: Jones Broom in Treatment: 24 Vital Signs Height(in): 68 Pulse(bpm): 96 Weight(lbs): 294 Blood Pressure 129/69 (mmHg): Body Mass Index(BMI): 45 Temperature(F): 97.7 Respiratory Rate 18 (breaths/min): Photos: [1:No Photos] [10:No Photos] [11:No Photos] Wound Location: [1:Right Lower Leg - Anterior Left Gluteus] [11:Right Gluteus] Wounding Event: [1:Gradually Appeared] [10:Gradually Appeared] [11:Gradually Appeared] Primary Etiology: [1:Venous Leg  Ulcer] [10:Pressure Ulcer] [11:Pressure Ulcer] Comorbid History: [1:Hypertension, Type II Diabetes] [10:Hypertension, Type II Diabetes] [11:Hypertension, Type II Diabetes] Date Acquired: [1:01/09/2015] [10:05/31/2015] [11:05/31/2015] Weeks of Treatment: [1:24] [10:6] [11:6] Wound Status: [1:Open] [10:Healed - Epithelialized] [11:Open] Measurements L x W x D 18x18x0.2 [10:0x0x0] [11:0x0x0] (cm) Area (cm) : [1:254.469] [10:0] [11:0] Volume (cm) : [1:50.894] [10:0] [11:0] % Reduction in Area: [1:-541323.40%] [10:100.00%] [11:100.00%] % Reduction in Volume: -1017780.00% [10:100.00%] [11:100.00%] Classification: [1:Full Thickness Without Exposed Support Structures] [10:Category/Stage II] [11:Category/Stage II] HBO Classification: [1:Grade 2] [10:N/A] [11:N/A] Exudate Amount: [1:Large] [10:None Present] [11:None Present] Exudate Type: [1:Serous] [10:N/A] [11:N/A] Exudate Color: [1:amber] [10:N/A] [11:N/A] Wound Margin: [1:Thickened] [10:N/A] [11:Flat and  Intact] Granulation Amount: [1:Small (1-33%)] [10:None Present (0%)] [11:None Present (0%)] Granulation Quality: [1:Pink] [10:N/A] [11:N/A] Necrotic Amount: [1:Large (67-100%)] [10:None Present (0%)] [11:None Present (0%)] Necrotic Tissue: [1:Eschar, Adherent Slough N/A] [11:N/A] Exposed Structures: [10:N/A] [11:N/A] Fascia: No Fat: No Tendon: No Muscle: No Joint: No Bone: No Limited to Skin Breakdown Epithelialization: None Large (67-100%) Large (67-100%) Periwound Skin Texture: Scarring: Yes No Abnormalities Noted No Abnormalities Noted Edema: No Excoriation: No Induration: No Callus: No Crepitus: No Fluctuance: No Friable: No Rash: No Periwound Skin Moist: Yes No Abnormalities Noted Maceration: No Moisture: Maceration: No Moist: No Dry/Scaly: No Dry/Scaly: No Periwound Skin Color: Atrophie Blanche: No No Abnormalities Noted No Abnormalities Noted Cyanosis: No Ecchymosis: No Erythema: No Hemosiderin Staining: No Mottled: No Pallor: No Rubor: No Temperature: No Abnormality N/A No Abnormality Tenderness on Yes No Yes Palpation: Wound Preparation: Ulcer Cleansing: Ulcer Cleansing: Ulcer Cleansing: Rinsed/Irrigated with Rinsed/Irrigated with Rinsed/Irrigated with Saline Saline Saline Topical Anesthetic Topical Anesthetic Topical Anesthetic Applied: Other: lidocaine Applied: None Applied: None 4% Wound Number: [id number] [phone]  Photos: No Photos No Photos No Photos Wound Location: Left Lower Leg - Lateral Right Lower Leg - Left Lower Leg - Posterior Posterior Wounding Event: Gradually Appeared Gradually Appeared Gradually Appeared Primary Etiology: Calciphylaxis Calciphylaxis Calciphylaxis Comorbid History: Hypertension, Type II Hypertension, Type II Hypertension, Type II Diabetes Diabetes Diabetes Date Acquired: 01/09/2015 03/27/2015 03/13/2015 Weeks of Treatment: 24 15 12  [name], [name] ([id number]) Wound Status: Open Open Open Measurements L x W x D 11.7x8x0.5  7.6x6.3x0.3 2.7x1.2x0.2 (cm) Area (cm) : 73.513 37.605 2.545 Volume (cm) : 36.757 11.281 0.509 % Reduction in Area: -568.50% -2028.20% 83.80% % Reduction in Volume: -3241.50% -6273.40% 83.80% Classification: Full Thickness Without Full Thickness Without Full Thickness Without Exposed Support Exposed Support Exposed Support Structures Structures Structures HBO Classification: Grade 1 Grade 1 Grade 2 Exudate Amount: Large Large Large Exudate Type: Serous Serous Serous Exudate Color: amber amber amber Wound Margin: Flat and Intact Flat and Intact Thickened Granulation Amount: Medium (34-66%) Medium (34-66%) None Present (0%) Granulation Quality: Red Red, Hyper-granulation N/A Necrotic Amount: Medium (34-66%) Medium (34-66%) Large (67-100%) Necrotic Tissue: Adherent Slough Eschar, Adherent Slough Adherent Slough Exposed Structures: Fascia: No Fascia: No N/A Fat: No Fat: No Tendon: No Tendon: No Muscle: No Muscle: No Joint: No Joint: No Bone: No Bone: No Limited to Skin Limited to Skin Breakdown Breakdown Epithelialization: None None None Periwound Skin Texture: Scarring: Yes Edema: Yes Scarring: Yes Edema: No Excoriation: No Excoriation: No Induration: No Induration: No Callus: No Callus: No Crepitus: No Crepitus: No Fluctuance: No Fluctuance: No Friable: No Friable: No Rash: No Rash: No Scarring: No Periwound Skin Moist: Yes Moist: Yes Moist: Yes Moisture: Maceration: No Maceration: No Dry/Scaly: No Dry/Scaly: No Periwound Skin Color: Atrophie Blanche: No Atrophie Blanche: No No Abnormalities Noted Cyanosis: No Cyanosis: No Ecchymosis: No Ecchymosis:  No Erythema: No Erythema: No Hemosiderin Staining: No Hemosiderin Staining: No Mottled: No Mottled: No Pallor: No Pallor: No Rubor: No Rubor: No Temperature: No Abnormality No Abnormality No Abnormality Yes Yes Yes Pore, Brodie H. ([id number]) Tenderness on Palpation: Wound  Preparation: Ulcer Cleansing: Other: Ulcer Cleansing: Other: Ulcer Cleansing: soap and water soap and water Rinsed/Irrigated with Saline Topical Anesthetic Topical Anesthetic Applied: Other: lidocaine Applied: Other: lidocaine Topical Anesthetic 4% 4% Applied: Other: lidocaine 4% Treatment Notes Electronic Signature(s) Signed: 07/19/2015 5:03:03 PM By: Alejandro Mulling Entered By: Alejandro Mulling on 07/19/2015 08:39:49 [name], [name] ([id number]) -------------------------------------------------------------------------------- Multi-Disciplinary Care Plan Details Patient Name: [name], [name] Date of Service: 07/19/2015 8:00 AM Medical Record Patient Account Number: [id number] Number: Treating RN: Phillis Haggis 04-06-1956 (59 y.o. Other Clinician: Date of Birth/Sex: Female) Treating BURNS III, Primary Care Physician: Fidel Levy Physician/Extender: Zollie Beckers Referring Physician: Jones Broom in Treatment: 42 Active Inactive Orientation to the Wound Care Program Nursing Diagnoses: Knowledge deficit related to the wound healing center program Goals: Patient/caregiver will verbalize understanding of the Wound Healing Center Program Date Initiated: 01/30/2015 Goal Status: Active Interventions: Provide education on orientation to the wound center Notes: Pain, Acute or Chronic Nursing Diagnoses: Pain, acute or chronic: actual or potential Goals: Patient will verbalize adequate pain control and receive pain control interventions during procedures as needed Date Initiated: 01/30/2015 Goal Status: Active Patient/caregiver will verbalize adequate pain control between visits Date Initiated: 01/30/2015 Goal Status: Active Interventions: Assess comfort goal upon admission Encourage patient to take pain medications as prescribed Notes: Venous Leg Ulcer [name], [name] ([id number]) Nursing Diagnoses: Potential for venous Insuffiency (use before  diagnosis confirmed) Goals: Non-invasive venous studies are completed as ordered Date Initiated: 01/30/2015 Goal Status: Active Interventions: Assess peripheral edema status every visit. Notes: Wound/Skin Impairment Nursing Diagnoses: Impaired tissue integrity Goals: Ulcer/skin breakdown will have a volume reduction of 30% by week 4 Date Initiated: 01/30/2015 Goal Status: Active Ulcer/skin breakdown will have a volume reduction of 50% by week 8 Date Initiated: 01/30/2015 Goal Status: Active Ulcer/skin breakdown will have a volume reduction of 80% by week 12 Date Initiated: 01/30/2015 Goal Status: Active Ulcer/skin breakdown will heal within 14 weeks Date Initiated: 01/30/2015 Goal Status: Active Interventions: Assess ulceration(s) every visit Notes: Electronic Signature(s) Signed: 07/19/2015 5:03:03 PM By: Alejandro Mulling Entered By: Alejandro Mulling on 07/19/2015 08:39:35 [name], [name] ([id number]) -------------------------------------------------------------------------------- Pain Assessment Details Patient Name: Nies, Eleanore H. Date of Service: 07/19/2015 8:00 AM Medical Record Patient Account Number: [id number] Number: Treating RN: Phillis Haggis [birth year] (59 y.o. Other Clinician: Date of Birth/Sex: Female) Treating BURNS III, Primary Care Physician: Fidel Levy Physician/Extender: Zollie Beckers Referring Physician: Jones Broom in Treatment: 24 Active Problems Location of Pain Severity and Description of Pain Patient Has Paino No Site Locations Pain Management and Medication Current Pain Management: Electronic Signature(s) Signed: 07/19/2015 5:03:03 PM By: Alejandro Mulling Entered By: Alejandro Mulling on 07/19/2015 08:09:26 Stave, Gisella Rexene Edison ([id number]) -------------------------------------------------------------------------------- Patient/Caregiver Education Details Patient Name: Wahlquist, Esperanza H. Date of Service: 07/19/2015  8:00 AM Medical Record Patient Account Number: [id number] Number: Treating RN: Huel Coventry October 05, 1955 (59 y.o. Other Clinician: Date of Birth/Gender: Female) Treating BURNS III, Primary Care Physician: Fidel Levy Physician/Extender: Zollie Beckers Referring Physician: Jones Broom in Treatment: 24 Education Assessment Education Provided To: Patient Education Topics Provided Wound/Skin Impairment: Handouts: Other: do not get wraps wet Methods: Demonstration, Explain/Verbal Responses: State content correctly Electronic Signature(s) Signed: 07/19/2015 5:28:49 PM By: Elliot Gurney, RN, BSN, Kim  RN, BSN Entered By: Elliot Gurney, RN, BSN, Kim on 07/19/2015 09:25:55 [name], [name] ([id number]) -------------------------------------------------------------------------------- Wound Assessment Details Patient Name: Briones, Timmi H. Date of Service: 07/19/2015 8:00 AM Medical Record Patient Account Number: [id number] Number: Treating RN: Phillis Haggis 09/14/1955 (59 y.o. Other Clinician: Date of Birth/Sex: Female) Treating BURNS III, Primary Care Physician: Fidel Levy Physician/Extender: Zollie Beckers Referring Physician: Jones Broom in Treatment: 24 Wound Status Wound Number: 1 Primary Etiology: Venous Leg Ulcer Wound Location: Right Lower Leg - Anterior Wound Status: Open Wounding Event: Gradually Appeared Comorbid History: Hypertension, Type II Diabetes Date Acquired: 01/09/2015 Weeks Of Treatment: 24 Clustered Wound: No Photos Photo Uploaded By: Alejandro Mulling on 07/19/2015 15:08:55 Wound Measurements Length: (cm) 18 Width: (cm) 18 Depth: (cm) 0.2 Area: (cm) 254.469 Volume: (cm) 50.894 % Reduction in Area: -541323.4% % Reduction in Volume: -1017780% Epithelialization: None Tunneling: No Undermining: No Wound Description Full Thickness Without Classification: Exposed Support Structures Diabetic Severity Grade  2 (Wagner): Wound Margin: Thickened Exudate Amount: Large Exudate Type: Serous Exudate Color: amber Foul Odor After Cleansing: No Wound Bed Jocelyn, Tesslyn H. ([id number]) Granulation Amount: Small (1-33%) Exposed Structure Granulation Quality: Pink Fascia Exposed: No Necrotic Amount: Large (67-100%) Fat Layer Exposed: No Necrotic Quality: Eschar, Adherent Slough Tendon Exposed: No Muscle Exposed: No Joint Exposed: No Bone Exposed: No Limited to Skin Breakdown Periwound Skin Texture Texture Color No Abnormalities Noted: No No Abnormalities Noted: No Callus: No Atrophie Blanche: No Crepitus: No Cyanosis: No Excoriation: No Ecchymosis: No Fluctuance: No Erythema: No Friable: No Hemosiderin Staining: No Induration: No Mottled: No Localized Edema: No Pallor: No Rash: No Rubor: No Scarring: Yes Temperature / Pain Moisture Temperature: No Abnormality No Abnormalities Noted: No Tenderness on Palpation: Yes Dry / Scaly: No Maceration: No Moist: Yes Wound Preparation Ulcer Cleansing: Rinsed/Irrigated with Saline Topical Anesthetic Applied: Other: lidocaine 4%, Treatment Notes Wound #1 (Right, Anterior Lower Leg) 1. Cleansed with: Cleanse wound with antibacterial soap and water 2. Anesthetic Topical Lidocaine 4% cream to wound bed prior to debridement 3. Peri-wound Care: Barrier cream 4. Dressing Applied: Aquacel Ag 5. Secondary Dressing Applied ABD Pad 7. Secured with 3 Layer Compression System - Bilateral Senna, Kaily H. ([id number]) Electronic Signature(s) Signed: 07/19/2015 5:03:03 PM By: Alejandro Mulling Entered By: Alejandro Mulling on 07/19/2015 08:29:51 Sligh, Tamari Rexene Edison ([id number]) -------------------------------------------------------------------------------- Wound Assessment Details Patient Name: Jou, Mindie H. Date of Service: 07/19/2015 8:00 AM Medical Record Patient Account Number: [id number] Number: Treating RN:  Phillis Haggis 06/14/1956 (59 y.o. Other Clinician: Date of Birth/Sex: Female) Treating BURNS III, Primary Care Physician: Fidel Levy Physician/Extender: Zollie Beckers Referring Physician: Jones Broom in Treatment: 24 Wound Status Wound Number: 10 Primary Etiology: Pressure Ulcer Wound Location: Left Gluteus Wound Status: Healed - Epithelialized Wounding Event: Gradually Appeared Comorbid History: Hypertension, Type II Diabetes Date Acquired: 05/31/2015 Weeks Of Treatment: 6 Clustered Wound: No Photos Photo Uploaded By: Alejandro Mulling on 07/19/2015 15:09:19 Wound Measurements Length: (cm) 0 % Reduction in Width: (cm) 0 % Reduction in Depth: (cm) 0 Epithelializat Area: (cm) 0 Tunneling: Volume: (cm) 0 Undermining: Area: 100% Volume: 100% ion: Large (67-100%) No No Wound Description Classification: Category/Stage II Exudate Amount: None Present Foul Odor After Cleansing: No Wound Bed Granulation Amount: None Present (0%) Necrotic Amount: None Present (0%) Periwound Skin Texture Texture Color No Abnormalities Noted: No No Abnormalities Noted: No Dancy, Gae H. ([id number]) Moisture No Abnormalities Noted: No Wound Preparation Ulcer Cleansing: Rinsed/Irrigated with Saline Topical Anesthetic Applied: None Electronic Signature(s) Signed: 07/19/2015 5:03:03  PM By: Alejandro Mulling Entered By: Alejandro Mulling on 07/19/2015 08:33:31 Rogacki, Kanijah Rexene Edison ([id number]) -------------------------------------------------------------------------------- Wound Assessment Details Patient Name: Michels, Alizzon H. Date of Service: 07/19/2015 8:00 AM Medical Record Patient Account Number: [id number] Number: Treating RN: Phillis Haggis [birth year] (59 y.o. Other Clinician: Date of Birth/Sex: Female) Treating BURNS III, Primary Care Physician: Fidel Levy Physician/Extender: Zollie Beckers Referring Physician: Jones Broom in Treatment:  24 Wound Status Wound Number: 11 Primary Etiology: Pressure Ulcer Wound Location: Right Gluteus Wound Status: Open Wounding Event: Gradually Appeared Comorbid History: Hypertension, Type II Diabetes Date Acquired: 05/31/2015 Weeks Of Treatment: 6 Clustered Wound: No Photos Photo Uploaded By: Alejandro Mulling on 07/19/2015 15:09:26 Wound Measurements Length: (cm) 0 % Reduction in Width: (cm) 0 % Reduction in Depth: (cm) 0 Epithelializati Area: (cm) 0 Tunneling: Volume: (cm) 0 Undermining: Area: 100% Volume: 100% on: Large (67-100%) No No Wound Description Classification: Category/Stage II Wound Margin: Flat and Intact Exudate Amount: None Present Foul Odor After Cleansing: No Wound Bed Granulation Amount: None Present (0%) Necrotic Amount: None Present (0%) Periwound Skin Texture Texture Color Silveria, Bessy H. ([id number]) No Abnormalities Noted: No No Abnormalities Noted: No Moisture Temperature / Pain No Abnormalities Noted: No Temperature: No Abnormality Dry / Scaly: No Tenderness on Palpation: Yes Maceration: No Moist: No Wound Preparation Ulcer Cleansing: Rinsed/Irrigated with Saline Topical Anesthetic Applied: None Electronic Signature(s) Signed: 07/19/2015 5:03:03 PM By: Alejandro Mulling Entered By: Alejandro Mulling on 07/19/2015 08:33:59 Hosea, Malie H. ([id number]) -------------------------------------------------------------------------------- Wound Assessment Details Patient Name: Lowrey, Mrytle H. Date of Service: 07/19/2015 8:00 AM Medical Record Patient Account Number: [id number] Number: Treating RN: Phillis Haggis 1955/11/20 (59 y.o. Other Clinician: Date of Birth/Sex: Female) Treating BURNS III, Primary Care Physician: Fidel Levy Physician/Extender: Zollie Beckers Referring Physician: Jones Broom in Treatment: 24 Wound Status Wound Number: 3 Primary Etiology: Calciphylaxis Wound Location: Left Lower Leg  - Lateral Wound Status: Open Wounding Event: Gradually Appeared Comorbid History: Hypertension, Type II Diabetes Date Acquired: 01/09/2015 Weeks Of Treatment: 24 Clustered Wound: No Photos Photo Uploaded By: Alejandro Mulling on 07/19/2015 15:09:37 Wound Measurements Length: (cm) 11.7 Width: (cm) 8 Depth: (cm) 0.5 Area: (cm) 73.513 Volume: (cm) 36.757 % Reduction in Area: -568.5% % Reduction in Volume: -3241.5% Epithelialization: None Tunneling: No Undermining: No Wound Description Full Thickness Without Classification: Exposed Support Structures Diabetic Severity Grade 1 (Wagner): Wound Margin: Flat and Intact Exudate Amount: Large Exudate Type: Serous Exudate Color: amber Foul Odor After Cleansing: No Wound Bed Ikard, Glendola H. ([id number]) Granulation Amount: Medium (34-66%) Exposed Structure Granulation Quality: Red Fascia Exposed: No Necrotic Amount: Medium (34-66%) Fat Layer Exposed: No Necrotic Quality: Adherent Slough Tendon Exposed: No Muscle Exposed: No Joint Exposed: No Bone Exposed: No Limited to Skin Breakdown Periwound Skin Texture Texture Color No Abnormalities Noted: No No Abnormalities Noted: No Callus: No Atrophie Blanche: No Crepitus: No Cyanosis: No Excoriation: No Ecchymosis: No Fluctuance: No Erythema: No Friable: No Hemosiderin Staining: No Induration: No Mottled: No Localized Edema: No Pallor: No Rash: No Rubor: No Scarring: Yes Temperature / Pain Moisture Temperature: No Abnormality No Abnormalities Noted: No Tenderness on Palpation: Yes Dry / Scaly: No Maceration: No Moist: Yes Wound Preparation Ulcer Cleansing: Other: soap and water, Topical Anesthetic Applied: Other: lidocaine 4%, Electronic Signature(s) Signed: 07/19/2015 5:03:03 PM By: Alejandro Mulling Entered By: Alejandro Mulling on 07/19/2015 08:27:46 Littlefield, Tarena Rexene Edison  ([id number]) -------------------------------------------------------------------------------- Wound Assessment Details Patient Name: Lieser, Shundra H. Date of Service: 07/19/2015 8:00 AM Medical Record Patient Account Number: [id number]  Number: Treating RN: Phillis Haggisinkerton, Debi 09/29/1955 (59 y.o. Other Clinician: Date of Birth/Sex: Female) Treating BURNS III, Primary Care Physician: Fidel LevyHAWKINS JR, JAMES Physician/Extender: Zollie BeckersWALTER Referring Physician: Jones BroomHAWKINS JR, JAMES Weeks in Treatment: 24 Wound Status Wound Number: 8 Primary Etiology: Calciphylaxis Wound Location: Right Lower Leg - Posterior Wound Status: Open Wounding Event: Gradually Appeared Comorbid History: Hypertension, Type II Diabetes Date Acquired: 03/27/2015 Weeks Of Treatment: 15 Clustered Wound: No Photos Photo Uploaded By: Alejandro MullingPinkerton, Debra on 07/19/2015 15:03:25 Wound Measurements Length: (cm) 7.6 Width: (cm) 6.3 Depth: (cm) 0.3 Area: (cm) 37.605 Volume: (cm) 11.281 % Reduction in Area: -2028.2% % Reduction in Volume: -6273.4% Epithelialization: None Tunneling: No Undermining: No Wound Description Full Thickness Without Classification: Exposed Support Structures Diabetic Severity Grade 1 (Wagner): Wound Margin: Flat and Intact Exudate Amount: Large Exudate Type: Serous Exudate Color: amber Foul Odor After Cleansing: No Wound Bed Rickles, Mazzy H. ([id number]) Granulation Amount: Medium (34-66%) Exposed Structure Granulation Quality: Red, Hyper-granulation Fascia Exposed: No Necrotic Amount: Medium (34-66%) Fat Layer Exposed: No Necrotic Quality: Eschar, Adherent Slough Tendon Exposed: No Muscle Exposed: No Joint Exposed: No Bone Exposed: No Limited to Skin Breakdown Periwound Skin Texture Texture Color No Abnormalities Noted: No No Abnormalities Noted: No Callus: No Atrophie Blanche: No Crepitus: No Cyanosis: No Excoriation: No Ecchymosis: No Fluctuance: No Erythema:  No Friable: No Hemosiderin Staining: No Induration: No Mottled: No Localized Edema: Yes Pallor: No Rash: No Rubor: No Scarring: No Temperature / Pain Moisture Temperature: No Abnormality No Abnormalities Noted: No Tenderness on Palpation: Yes Dry / Scaly: No Maceration: No Moist: Yes Wound Preparation Ulcer Cleansing: Other: soap and water, Topical Anesthetic Applied: Other: lidocaine 4%, Electronic Signature(s) Signed: 07/19/2015 5:03:03 PM By: Alejandro MullingPinkerton, Debra Entered By: Alejandro MullingPinkerton, Debra on 07/19/2015 08:31:09 Barton, Lilliemae Rexene EdisonH. ([id number]) -------------------------------------------------------------------------------- Wound Assessment Details Patient Name: Pinkley, Maylin H. Date of Service: 07/19/2015 8:00 AM Medical Record Patient Account Number: [id number] Number: Treating RN: Phillis Haggisinkerton, Debi 09/29/1955 (59 y.o. Other Clinician: Date of Birth/Sex: Female) Treating BURNS III, Primary Care Physician: Fidel LevyHAWKINS JR, JAMES Physician/Extender: Zollie BeckersWALTER Referring Physician: Jones BroomHAWKINS JR, JAMES Weeks in Treatment: 24 Wound Status Wound Number: [id number] Primary Etiology: Calciphylaxis Wound Location: Left Lower Leg - Posterior Wound Status: Open Wounding Event: Gradually Appeared Comorbid History: Hypertension, Type II Diabetes Date Acquired: 03/13/2015 Weeks Of Treatment: 12 Clustered Wound: No Photos Photo Uploaded By: Alejandro MullingPinkerton, Debra on 07/19/2015 15:03:26 Wound Measurements Length: (cm) 2.7 Width: (cm) 1.2 Depth: (cm) 0.2 Area: (cm) 2.545 Volume: (cm) 0.509 % Reduction in Area: 83.8% % Reduction in Volume: 83.8% Epithelialization: None Tunneling: No Undermining: No Wound Description Full Thickness Without Classification: Exposed Support Structures Diabetic Severity Grade 2 (Wagner): Wound Margin: Thickened Exudate Amount: Large Exudate Type: Serous Exudate Color: amber Foul Odor After Cleansing: No Wound Bed Bilton, Biana H.  ([id number]) Granulation Amount: None Present (0%) Necrotic Amount: Large (67-100%) Necrotic Quality: Adherent Slough Periwound Skin Texture Texture Color No Abnormalities Noted: No No Abnormalities Noted: No Scarring: Yes Temperature / Pain Moisture Temperature: No Abnormality No Abnormalities Noted: No Tenderness on Palpation: Yes Moist: Yes Wound Preparation Ulcer Cleansing: Rinsed/Irrigated with Saline Topical Anesthetic Applied: Other: lidocaine 4%, Electronic Signature(s) Signed: 07/19/2015 5:03:03 PM By: Alejandro MullingPinkerton, Debra Entered By: Alejandro MullingPinkerton, Debra on 07/19/2015 08:27:15 Longton, Kobi Rexene EdisonH. ([id number]) -------------------------------------------------------------------------------- Vitals Details Patient Name: Pendley, Aldora H. Date of Service: 07/19/2015 8:00 AM Medical Record Patient Account Number: [id number] Number: Treating RN: Phillis Haggisinkerton, Debi 09/29/1955 (59 y.o. Other Clinician: Date of Birth/Sex: Female) Treating BURNS III, Primary Care Physician: Fidel LevyHAWKINS JR, JAMES Physician/Extender:  WALTER Referring Physician: Jones Broom in Treatment: 24 Vital Signs Time Taken: 08:09 Temperature (F): 97.7 Height (in): 68 Pulse (bpm): 96 Weight (lbs): 294 Respiratory Rate (breaths/min): 18 Body Mass Index (BMI): 44.7 Blood Pressure (mmHg): 129/69 Reference Range: 80 - 120 mg / dl Electronic Signature(s) Signed: 07/19/2015 5:03:03 PM By: Alejandro Mulling Entered By: Alejandro Mulling on 07/19/2015 08:13:41

## 2015-07-26 ENCOUNTER — Encounter: Payer: BC Managed Care – PPO | Admitting: Surgery

## 2015-07-26 DIAGNOSIS — L97222 Non-pressure chronic ulcer of left calf with fat layer exposed: Secondary | ICD-10-CM | POA: Diagnosis not present

## 2015-07-26 DIAGNOSIS — R600 Localized edema: Secondary | ICD-10-CM | POA: Diagnosis not present

## 2015-07-26 DIAGNOSIS — L97212 Non-pressure chronic ulcer of right calf with fat layer exposed: Secondary | ICD-10-CM | POA: Diagnosis not present

## 2015-07-26 DIAGNOSIS — E11622 Type 2 diabetes mellitus with other skin ulcer: Secondary | ICD-10-CM | POA: Diagnosis not present

## 2015-07-26 DIAGNOSIS — Z992 Dependence on renal dialysis: Secondary | ICD-10-CM | POA: Diagnosis not present

## 2015-07-26 DIAGNOSIS — N186 End stage renal disease: Secondary | ICD-10-CM | POA: Diagnosis not present

## 2015-07-27 NOTE — Progress Notes (Signed)
[name], [name] ([id number]) Visit Report for 07/26/2015 Chief Complaint Document Details Patient Name: [name], BRAY. Date of Service: 07/26/2015 8:00 AM Medical Record Patient Account Number: [id number] Number: Treating RN: Curtis Sites [birth year] (59 y.o. Other Clinician: Date of Birth/Sex: Female) Treating BURNS III, Primary Care Physician/Extender: Karn Cassis Physician: Referring Physician: Jones Broom in Treatment: 25 Information Obtained from: Patient Chief Complaint Chronic bilateral calf ulcers. Electronic Signature(s) Signed: 07/26/2015 3:52:06 PM By: Madelaine Bhat MD Entered By: Madelaine Bhat on 07/26/2015 13:26:27 [name], [name] [name] ([id number]) -------------------------------------------------------------------------------- Debridement Details Patient Name: [name], [name] Date of Service: 07/26/2015 8:00 AM Medical Record Patient Account Number: [id number] Number: Treating RN: Curtis Sites January 31, 1956 (59 y.o. Other Clinician: Date of Birth/Sex: Female) Treating BURNS III, Primary Care Physician/Extender: Karn Cassis Physician: Referring Physician: Jones Broom in Treatment: 25 Debridement Performed for Wound #1 Right,Anterior Lower Leg Assessment: Performed By: Physician BURNS III, Melanie Crazier., MD Debridement: Debridement Pre-procedure Yes Verification/Time Out Taken: Start Time: 09:00 Pain Control: Lidocaine 4% Topical Solution Level: Skin/Subcutaneous Tissue Total Area Debrided (L x 14.5 (cm) x 18 (cm) = 261 (cm) W): Tissue and other Viable, Non-Viable, Eschar, Fat, Fibrin/Slough, Subcutaneous material debrided: Instrument: Curette Bleeding: Minimum Hemostasis Achieved: Pressure End Time: 09:02 Procedural Pain: 0 Post Procedural Pain: 0 Response to Treatment: Procedure was tolerated well Post Debridement Measurements of Total Wound Length: (cm)  14.5 Width: (cm) 18 Depth: (cm) 0.3 Volume: (cm) 61.497 Post Procedure Diagnosis Same as Pre-procedure Electronic Signature(s) Signed: 07/26/2015 3:52:06 PM By: Madelaine Bhat MD Signed: 07/26/2015 5:21:59 PM By: Curtis Sites Entered By: Madelaine Bhat on 07/26/2015 13:25:20 [name], [name] ([id number]) [name], [name] ([id number]) -------------------------------------------------------------------------------- Debridement Details Patient Name: [name], [name] Date of Service: 07/26/2015 8:00 AM Medical Record Patient Account Number: [id number] Number: Treating RN: Curtis Sites 1956/06/25 (59 y.o. Other Clinician: Date of Birth/Sex: Female) Treating BURNS III, Primary Care Physician/Extender: Karn Cassis Physician: Referring Physician: Jones Broom in Treatment: 25 Debridement Performed for Wound #3 Left,Lateral Lower Leg Assessment: Performed By: Physician BURNS III, Melanie Crazier., MD Debridement: Debridement Pre-procedure Yes Verification/Time Out Taken: Start Time: 08:57 Pain Control: Lidocaine 4% Topical Solution Level: Skin/Subcutaneous Tissue Total Area Debrided (L x 10.6 (cm) x 3 (cm) = 31.8 (cm) W): Tissue and other Viable, Non-Viable, Eschar, Fat, Fibrin/Slough, Subcutaneous material debrided: Instrument: Curette Bleeding: Minimum Hemostasis Achieved: Pressure End Time: 08:59 Procedural Pain: 0 Post Procedural Pain: 0 Response to Treatment: Procedure was tolerated well Post Debridement Measurements of Total Wound Length: (cm) 10.6 Width: (cm) 3 Depth: (cm) 0.5 Volume: (cm) 12.488 Post Procedure Diagnosis Same as Pre-procedure Electronic Signature(s) Signed: 07/26/2015 3:52:06 PM By: Madelaine Bhat MD Signed: 07/26/2015 5:21:59 PM By: Curtis Sites Entered By: Madelaine Bhat on 07/26/2015 13:25:39 [name], [name] ([id number]) [name], [name]  ([id number]) -------------------------------------------------------------------------------- Debridement Details Patient Name: [name], [name] Date of Service: 07/26/2015 8:00 AM Medical Record Patient Account Number: [id number] Number: Treating RN: Curtis Sites 1955/08/12 (59 y.o. Other Clinician: Date of Birth/Sex: Female) Treating BURNS III, Primary Care Physician/Extender: Karn Cassis Physician: Referring Physician: Jones Broom in Treatment: 25 Debridement Performed for Wound #8 Right,Posterior Lower Leg Assessment: Performed By: Physician BURNS III, Melanie Crazier., MD Debridement: Debridement Pre-procedure Yes Verification/Time Out Taken: Start Time: 09:02 Pain Control: Lidocaine 4% Topical Solution Level: Skin/Subcutaneous Tissue Total Area Debrided (L x 7.5 (cm) x 5.5 (cm) = 41.25 (cm) W):  Tissue and other Viable, Non-Viable, Eschar, Fat, Fibrin/Slough, Subcutaneous material debrided: Instrument: Curette Bleeding: Minimum Hemostasis Achieved: Pressure End Time: 09:03 Procedural Pain: 0 Post Procedural Pain: 0 Response to Treatment: Procedure was tolerated well Post Debridement Measurements of Total Wound Length: (cm) 7.5 Width: (cm) 5.5 Depth: (cm) 0.4 Volume: (cm) 12.959 Post Procedure Diagnosis Same as Pre-procedure Electronic Signature(s) Signed: 07/26/2015 3:52:06 PM By: Madelaine Bhat MD Signed: 07/26/2015 5:21:59 PM By: Curtis Sites Entered By: Madelaine Bhat on 07/26/2015 13:25:58 [name], [name] ([id number]) [name], [name] ([id number]) -------------------------------------------------------------------------------- Debridement Details Patient Name: [name], [name] Date of Service: 07/26/2015 8:00 AM Medical Record Patient Account Number: [id number] Number: Treating RN: Curtis Sites 02/09/1956 (59 y.o. Other Clinician: Date of Birth/Sex: Female) Treating BURNS III, Primary  Care Physician/Extender: Karn Cassis Physician: Referring Physician: Jones Broom in Treatment: 25 Debridement Performed for Wound #9 Left,Posterior Lower Leg Assessment: Performed By: Physician BURNS III, Melanie Crazier., MD Debridement: Debridement Pre-procedure Yes Verification/Time Out Taken: Start Time: 08:59 Pain Control: Lidocaine 4% Topical Solution Level: Skin/Subcutaneous Tissue Total Area Debrided (L x 2.3 (cm) x 1.1 (cm) = 2.53 (cm) W): Tissue and other Viable, Non-Viable, Eschar, Fat, Fibrin/Slough, Subcutaneous material debrided: Instrument: Curette Bleeding: Minimum Hemostasis Achieved: Pressure End Time: 09:00 Procedural Pain: 0 Post Procedural Pain: 0 Response to Treatment: Procedure was tolerated well Post Debridement Measurements of Total Wound Length: (cm) 2.3 Width: (cm) 1.1 Depth: (cm) 0.3 Volume: (cm) 0.596 Post Procedure Diagnosis Same as Pre-procedure Electronic Signature(s) Signed: 07/26/2015 3:52:06 PM By: Madelaine Bhat MD Signed: 07/26/2015 5:21:59 PM By: Curtis Sites Entered By: Madelaine Bhat on 07/26/2015 13:26:17 [name], [name] [name] ([id number]) Wintle, Jameya H. ([id number]) -------------------------------------------------------------------------------- HPI Details Patient Name: Halvorsen, Tymika H. Date of Service: 07/26/2015 8:00 AM Medical Record Patient Account Number: [id number] Number: Treating RN: Curtis Sites [birth year] (59 y.o. Other Clinician: Date of Birth/Sex: Female) Treating BURNS III, Primary Care Physician/Extender: Karn Cassis Physician: Referring Physician: Jones Broom in Treatment: 25 History of Present Illness HPI Description: Pleasant 60 year old with h/o DM (Hgb A1c 7.4 in Aug 2016), ESRD (on hemodialysis since July 2016). Presented to her PCP Dr. Venora Maples with BLE ulcers since June 2016. Arterial ultrasound in August 2016 showed no  significant peripheral arterial disease on the right and mild tibioperoneal atherosclerotic disease on the left. Left lower extremity ultrasound in May 2016 showed no evidence for DVT. No assessment for venous insufficiency. Culture 04/19/2015 grew methicillin sensitive staph aureus (sensitive to tetracycline), Stenotrophomonas maltophilia, and Enterococcus faecalis. Completed course of doxycycline. Hospitalized in Nov 2016 for worsening cellulitis. Treated with IV antibiotics. No operative debridement or biopsy. Punch biopsy of left calf ulcer 06/28/2015 showed no evidence for malignancy. Bilateral lower extremity venous ultrasound 07/20/2015 showed no evidence for DVT or chronic venous insufficiency. Performing dressing changes with silver alginate. Tolerating Profore light bilaterally 3x/week. She returns to clinic and reports that her legs feel better with compression. No fever or chills. Less drainage. Electronic Signature(s) Signed: 07/26/2015 3:52:06 PM By: Madelaine Bhat MD Entered By: Madelaine Bhat on 07/26/2015 13:27:53 Bittick, [name] [name] ([id number]) -------------------------------------------------------------------------------- Physical Exam Details Patient Name: Toral, Ladona H. Date of Service: 07/26/2015 8:00 AM Medical Record Patient Account Number: [id number] Number: Treating RN: Curtis Sites 1955-07-17 (59 y.o. Other Clinician: Date of Birth/Sex: Female) Treating BURNS III, Primary Care Physician/Extender: Karn Cassis Physician: Referring Physician: Fidel Levy Weeks in Treatment: 25 Constitutional . Pulse regular. Respirations normal  and unlabored. Afebrile. . Notes Bilateral calf ulcerations improved with compression. No significant cellulitis. Less necrotic tissue. Persistent 2+ pitting edema. Faintly palpable DP bilaterally. Triphasic on the right. Biphasic on the left. Noncompressible. Electronic  Signature(s) Signed: 07/26/2015 3:52:06 PM By: Madelaine BhatBurns, III, Walter MD Entered By: Madelaine BhatBurns, III, Walter on 07/26/2015 13:28:18 Lehr, [name] EdelmanPAMELA H. ([id number]) -------------------------------------------------------------------------------- Physician Orders Details Patient Name: Burchill, Ronella H. Date of Service: 07/26/2015 8:00 AM Medical Record Patient Account Number: [id number] Number: Treating RN: Curtis SitesDorthy, Joanna 1955/08/03 (59 y.o. Other Clinician: Date of Birth/Sex: Female) Treating BURNS III, Primary Care Physician/Extender: Karn CassisWALTER HAWKINS JR, JAMES Physician: Referring Physician: Jones BroomHAWKINS JR, JAMES Weeks in Treatment: 25 Verbal / Phone Orders: Yes Clinician: Curtis Sitesorthy, Joanna Read Back and Verified: Yes Diagnosis Coding Wound Cleansing Wound #1 Right,Anterior Lower Leg o Clean wound with Normal Saline. o Cleanse wound with mild soap and water o May Shower, gently pat wound dry prior to applying new dressing. Wound #3 Left,Lateral Lower Leg o Clean wound with Normal Saline. o Cleanse wound with mild soap and water o May Shower, gently pat wound dry prior to applying new dressing. Wound #8 Right,Posterior Lower Leg o Clean wound with Normal Saline. o Cleanse wound with mild soap and water o May Shower, gently pat wound dry prior to applying new dressing. Wound #9 Left,Posterior Lower Leg o Clean wound with Normal Saline. o Cleanse wound with mild soap and water o May Shower, gently pat wound dry prior to applying new dressing. Anesthetic Wound #1 Right,Anterior Lower Leg o Topical Lidocaine 4% cream applied to wound bed prior to debridement Wound #3 Left,Lateral Lower Leg o Topical Lidocaine 4% cream applied to wound bed prior to debridement Wound #8 Right,Posterior Lower Leg o Topical Lidocaine 4% cream applied to wound bed prior to debridement Wound #9 Left,Posterior Lower Leg o Topical Lidocaine 4% cream applied to wound bed  prior to debridement Koziel, Laiah H. ([id number]) Skin Barriers/Peri-Wound Care Wound #1 Right,Anterior Lower Leg o Barrier cream Wound #3 Left,Lateral Lower Leg o Barrier cream Wound #8 Right,Posterior Lower Leg o Barrier cream Wound #9 Left,Posterior Lower Leg o Barrier cream Primary Wound Dressing Wound #1 Right,Anterior Lower Leg o Aquacel Ag - or equivalent (No foam) Wound #3 Left,Lateral Lower Leg o Aquacel Ag - or equivalent (No foam) Wound #8 Right,Posterior Lower Leg o Aquacel Ag - or equivalent (No foam) Wound #9 Left,Posterior Lower Leg o Aquacel Ag - or equivalent (No foam) Secondary Dressing Wound #1 Right,Anterior Lower Leg o ABD pad - please add ABD pad to dressing Wound #3 Left,Lateral Lower Leg o ABD pad - please add ABD pad to dressing Wound #8 Right,Posterior Lower Leg o ABD pad - please add ABD pad to dressing Wound #9 Left,Posterior Lower Leg o ABD pad - please add ABD pad to dressing Dressing Change Frequency Wound #1 Right,Anterior Lower Leg o Change Dressing Monday, Wednesday, Friday - Wednesday in wound care center Wound #3 Left,Lateral Lower Leg o Change Dressing Monday, Wednesday, Friday - Wednesday in wound care center Wound #8 Right,Posterior Lower Leg o Change Dressing Monday, Wednesday, Friday - Wednesday in wound care center [facility] Kalea H. ([id number]) Wound #9 Left,Posterior Lower Leg o Change Dressing Monday, Wednesday, Friday - Wednesday in wound care center Follow-up Appointments Wound #1 Right,Anterior Lower Leg o Return Appointment in 1 week. Wound #3 Left,Lateral Lower Leg o Return Appointment in 1 week. Wound #8 Right,Posterior Lower Leg o Return Appointment in 1 week. Wound #9 Left,Posterior Lower Leg o Return Appointment in  1 week. Edema Control Wound #1 Right,Anterior Lower Leg o 3 Layer Compression System - Bilateral - Profore lite o Elevate legs to the level of the heart  and pump ankles as often as possible - Elevate legs at Dialysis Wound #3 Left,Lateral Lower Leg o 3 Layer Compression System - Bilateral - Profore lite o Elevate legs to the level of the heart and pump ankles as often as possible - Elevate legs at Dialysis Wound #8 Right,Posterior Lower Leg o 3 Layer Compression System - Bilateral - Profore lite o Elevate legs to the level of the heart and pump ankles as often as possible - Elevate legs at Dialysis Wound #9 Left,Posterior Lower Leg o 3 Layer Compression System - Bilateral - Profore lite o Elevate legs to the level of the heart and pump ankles as often as possible - Elevate legs at Dialysis Off-Loading Wound #1 Right,Anterior Lower Leg o Turn and reposition every 2 hours Wound #3 Left,Lateral Lower Leg o Turn and reposition every 2 hours Wound #8 Right,Posterior Lower Leg o Turn and reposition every 2 hours Piccininni, Katana H. ([id number]) Wound #9 Left,Posterior Lower Leg o Turn and reposition every 2 hours Additional Orders / Instructions Wound #1 Right,Anterior Lower Leg o Activity as tolerated Wound #3 Left,Lateral Lower Leg o Activity as tolerated Wound #8 Right,Posterior Lower Leg o Activity as tolerated Wound #9 Left,Posterior Lower Leg o Activity as tolerated Home Health Wound #1 Right,Anterior Lower Leg o Continue Home Health Visits - Advanced: Monday, Wednesday and Friday **WRAP GOES FROM BASE OF TOES TO THREE FINGER WIDTHS BENEATH BEND IN KNEE. o Home Health Nurse may visit PRN to address patientos wound care needs. o FACE TO FACE ENCOUNTER: MEDICARE and MEDICAID PATIENTS: I certify that this patient is under my care and that I had a face-to-face encounter that meets the physician face-to-face encounter requirements with this patient on this date. The encounter with the patient was in whole or in part for the following MEDICAL CONDITION: (primary reason for Home Healthcare) MEDICAL  NECESSITY: I certify, that based on my findings, NURSING services are a medically necessary home health service. HOME BOUND STATUS: I certify that my clinical findings support that this patient is homebound (i.e., Due to illness or injury, pt requires aid of supportive devices such as crutches, cane, wheelchairs, walkers, the use of special transportation or the assistance of another person to leave their place of residence. There is a normal inability to leave the home and doing so requires considerable and taxing effort. Other absences are for medical reasons / religious services and are infrequent or of short duration when for other reasons). o If current dressing causes regression in wound condition, may D/C ordered dressing product/s and apply Normal Saline Moist Dressing daily until next Wound Healing Center / Other MD appointment. Notify Wound Healing Center of regression in wound condition at [phone]. o Please direct any NON-WOUND related issues/requests for orders to patient's Primary Care Physician Wound #3 Left,Lateral Lower Leg o Continue Home Health Visits - Advanced: Monday, Wednesday and Friday **WRAP GOES FROM BASE OF TOES TO THREE FINGER WIDTHS BENEATH BEND IN KNEE. o Home Health Nurse may visit PRN to address patientos wound care needs. o FACE TO FACE ENCOUNTER: MEDICARE and MEDICAID PATIENTS: I certify that this patient is under my care and that I had a face-to-face encounter that meets the physician face-to-face encounter requirements with this patient on this date. The encounter with the patient was in whole or in part for  the following MEDICAL CONDITION: (primary reason for Home Healthcare) MEDICAL NECESSITY: I certify, that based on my findings, NURSING services are a medically Spagnoli, Shatyra H. ([id number]) necessary home health service. HOME BOUND STATUS: I certify that my clinical findings support that this patient is homebound (i.e., Due to illness  or injury, pt requires aid of supportive devices such as crutches, cane, wheelchairs, walkers, the use of special transportation or the assistance of another person to leave their place of residence. There is a normal inability to leave the home and doing so requires considerable and taxing effort. Other absences are for medical reasons / religious services and are infrequent or of short duration when for other reasons). o If current dressing causes regression in wound condition, may D/C ordered dressing product/s and apply Normal Saline Moist Dressing daily until next Wound Healing Center / Other MD appointment. Notify Wound Healing Center of regression in wound condition at [phone]. o Please direct any NON-WOUND related issues/requests for orders to patient's Primary Care Physician Wound #8 Right,Posterior Lower Leg o Continue Home Health Visits - Advanced: Monday, Wednesday and Friday **WRAP GOES FROM BASE OF TOES TO THREE FINGER WIDTHS BENEATH BEND IN KNEE. o Home Health Nurse may visit PRN to address patientos wound care needs. o FACE TO FACE ENCOUNTER: MEDICARE and MEDICAID PATIENTS: I certify that this patient is under my care and that I had a face-to-face encounter that meets the physician face-to-face encounter requirements with this patient on this date. The encounter with the patient was in whole or in part for the following MEDICAL CONDITION: (primary reason for Home Healthcare) MEDICAL NECESSITY: I certify, that based on my findings, NURSING services are a medically necessary home health service. HOME BOUND STATUS: I certify that my clinical findings support that this patient is homebound (i.e., Due to illness or injury, pt requires aid of supportive devices such as crutches, cane, wheelchairs, walkers, the use of special transportation or the assistance of another person to leave their place of residence. There is a normal inability to leave the home and doing so  requires considerable and taxing effort. Other absences are for medical reasons / religious services and are infrequent or of short duration when for other reasons). o If current dressing causes regression in wound condition, may D/C ordered dressing product/s and apply Normal Saline Moist Dressing daily until next Wound Healing Center / Other MD appointment. Notify Wound Healing Center of regression in wound condition at [phone]. o Please direct any NON-WOUND related issues/requests for orders to patient's Primary Care Physician Wound #9 Left,Posterior Lower Leg o Continue Home Health Visits - Advanced: Monday, Wednesday and Friday **WRAP GOES FROM BASE OF TOES TO THREE FINGER WIDTHS BENEATH BEND IN KNEE. o Home Health Nurse may visit PRN to address patientos wound care needs. o FACE TO FACE ENCOUNTER: MEDICARE and MEDICAID PATIENTS: I certify that this patient is under my care and that I had a face-to-face encounter that meets the physician face-to-face encounter requirements with this patient on this date. The encounter with the patient was in whole or in part for the following MEDICAL CONDITION: (primary reason for Home Healthcare) MEDICAL NECESSITY: I certify, that based on my findings, NURSING services are a medically necessary home health service. HOME BOUND STATUS: I certify that my clinical findings support that this patient is homebound (i.e., Due to illness or injury, pt requires aid of supportive devices such as crutches, cane, wheelchairs, walkers, the use of special transportation or the assistance of another  person to leave their place of residence. There is a normal inability to leave the home and doing so requires considerable and taxing effort. Other Rueb, Aisha H. ([id number]) absences are for medical reasons / religious services and are infrequent or of short duration when for other reasons). o If current dressing causes regression in wound condition,  may D/C ordered dressing product/s and apply Normal Saline Moist Dressing daily until next Wound Healing Center / Other MD appointment. Notify Wound Healing Center of regression in wound condition at [phone]. o Please direct any NON-WOUND related issues/requests for orders to patient's Primary Care Physician Electronic Signature(s) Signed: 07/26/2015 3:52:06 PM By: Madelaine Bhat MD Signed: 07/26/2015 5:21:59 PM By: Curtis Sites Entered By: Curtis Sites on 07/26/2015 09:04:10 [place], [name] [name] ([id number]) -------------------------------------------------------------------------------- Problem List Details Patient Name: Otter, Elfie H. Date of Service: 07/26/2015 8:00 AM Medical Record Patient Account Number: [id number] Number: Treating RN: Curtis Sites 1955-08-04 (59 y.o. Other Clinician: Date of Birth/Sex: Female) Treating BURNS III, Primary Care Physician/Extender: Karn Cassis Physician: Referring Physician: Jones Broom in Treatment: 25 Active Problems ICD-10 Encounter Code Description Active Date Diagnosis E11.622 Type 2 diabetes mellitus with other skin ulcer 01/30/2015 Yes N18.6 End stage renal disease 01/30/2015 Yes L97.222 Non-pressure chronic ulcer of left calf with fat layer 01/30/2015 Yes exposed L97.212 Non-pressure chronic ulcer of right calf with fat layer 01/30/2015 Yes exposed R60.0 Localized edema 04/19/2015 Yes I89.0 Lymphedema, not elsewhere classified 07/12/2015 Yes Inactive Problems Resolved Problems Electronic Signature(s) Signed: 07/26/2015 3:52:06 PM By: Madelaine Bhat MD Entered By: Madelaine Bhat on 07/26/2015 13:24:57 Winograd, Zarra Rexene Edison ([id number]) -------------------------------------------------------------------------------- Progress Note Details Patient Name: [name], Rosezetta H. Date of Service: 07/26/2015 8:00 AM Medical Record Patient Account Number:  [id number] Number: Treating RN: Curtis Sites 13-May-1956 (59 y.o. Other Clinician: Date of Birth/Sex: Female) Treating BURNS III, Primary Care Physician/Extender: Karn Cassis Physician: Referring Physician: Jones Broom in Treatment: 25 Subjective Chief Complaint Information obtained from Patient Chronic bilateral calf ulcers. History of Present Illness (HPI) Pleasant 60 year old with h/o DM (Hgb A1c 7.4 in Aug 2016), ESRD (on hemodialysis since July 2016). Presented to her PCP Dr. Venora Maples with BLE ulcers since June 2016. 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 Left lower extremity ultrasound in May 2016 showed no evidence for DVT. No assessment for venous insufficiency. Culture 04/19/2015 grew methicillin sensitive staph aureus (sensitive to tetracycline), Stenotrophomonas maltophilia, and Enterococcus faecalis. Completed course of doxycycline. Hospitalized in Nov 2016 for worsening cellulitis. Treated with IV antibiotics. No operative debridement or biopsy. Punch biopsy of left calf ulcer 06/28/2015 showed no evidence for malignancy. Bilateral lower extremity venous ultrasound 07/20/2015 showed no evidence for DVT or chronic venous insufficiency. Performing dressing changes with silver alginate. Tolerating Profore light bilaterally 3x/week. She returns to clinic and reports that her legs feel better with compression. No fever or chills. Less drainage. Objective Constitutional Pulse regular. Respirations normal and unlabored. Afebrile. Vitals Time Taken: 8:18 AM, Height: 68 in, Weight: 294 lbs, BMI: 44.7, Temperature: 98.2 F, Pulse: 89 bpm, Respiratory Rate: 18 breaths/min, Blood Pressure: 149/76 mmHg. Larsson, Zailee HMarland Kitchen ([id number]) General Notes: Bilateral calf ulcerations improved with compression. No significant  cellulitis. Less necrotic tissue. Persistent 2+ pitting edema. Faintly palpable DP bilaterally. Triphasic on the right. Biphasic on the left. Noncompressible. Integumentary (Hair, Skin) Wound #1 status is Open. Original cause of wound was  Gradually Appeared. The wound is located on the Right,Anterior Lower Leg. The wound measures 14.5cm length x 18cm width x 0.2cm depth; 204.989cm^2 area and 40.998cm^3 volume. The wound is limited to skin breakdown. There is no tunneling or undermining noted. There is a large amount of serous drainage noted. The wound margin is thickened. There is medium (34-66%) pink granulation within the wound bed. There is a medium (34-66%) amount of necrotic tissue within the wound bed including Eschar and Adherent Slough. The periwound skin appearance exhibited: Scarring, Moist. The periwound skin appearance did not exhibit: Callus, Crepitus, Excoriation, Fluctuance, Friable, Induration, Localized Edema, Rash, Dry/Scaly, Maceration, Atrophie Blanche, Cyanosis, Ecchymosis, Hemosiderin Staining, Mottled, Pallor, Rubor, Erythema. Periwound temperature was noted as No Abnormality. The periwound has tenderness on palpation. Wound #3 status is Open. Original cause of wound was Gradually Appeared. The wound is located on the Left,Lateral Lower Leg. The wound measures 10.6cm length x 3cm width x 0.4cm depth; 24.976cm^2 area and 9.99cm^3 volume. The wound is limited to skin breakdown. There is no tunneling or undermining noted. There is a large amount of serous drainage noted. The wound margin is flat and intact. There is medium (34-66%) red granulation within the wound bed. There is a medium (34-66%) amount of necrotic tissue within the wound bed including Adherent Slough. The periwound skin appearance exhibited: Scarring, Moist. The periwound skin appearance did not exhibit: Callus, Crepitus, Excoriation, Fluctuance, Friable, Induration, Localized Edema, Rash, Dry/Scaly,  Maceration, Atrophie Blanche, Cyanosis, Ecchymosis, Hemosiderin Staining, Mottled, Pallor, Rubor, Erythema. Periwound temperature was noted as No Abnormality. The periwound has tenderness on palpation. Wound #8 status is Open. Original cause of wound was Gradually Appeared. The wound is located on the Right,Posterior Lower Leg. The wound measures 7.5cm length x 5.5cm width x 0.3cm depth; 32.398cm^2 area and 9.719cm^3 volume. The wound is limited to skin breakdown. There is no tunneling or undermining noted. There is a large amount of serous drainage noted. The wound margin is flat and intact. There is medium (34-66%) red granulation within the wound bed. There is a medium (34-66%) amount of necrotic tissue within the wound bed including Eschar and Adherent Slough. The periwound skin appearance exhibited: Localized Edema, Moist. The periwound skin appearance did not exhibit: Callus, Crepitus, Excoriation, Fluctuance, Friable, Induration, Rash, Scarring, Dry/Scaly, Maceration, Atrophie Blanche, Cyanosis, Ecchymosis, Hemosiderin Staining, Mottled, Pallor, Rubor, Erythema. Periwound temperature was noted as No Abnormality. The periwound has tenderness on palpation. Wound #9 status is Open. Original cause of wound was Gradually Appeared. The wound is located on the Left,Posterior Lower Leg. The wound measures 2.3cm length x 1.1cm width x 0.2cm depth; 1.987cm^2 area and 0.397cm^3 volume. There is no tunneling or undermining noted. There is a large amount of serous drainage noted. The wound margin is thickened. There is small (1-33%) pink granulation within the wound bed. There is a large (67-100%) amount of necrotic tissue within the wound bed including Adherent Slough. The periwound skin appearance exhibited: Scarring, Moist. Periwound temperature was noted as No Abnormality. The periwound has tenderness on palpation. HARLYM, GEHLING ([id number]) Assessment Active Problems ICD-10 E11.622 - Type  2 diabetes mellitus with other skin ulcer N18.6 - End stage renal disease L97.222 - Non-pressure chronic ulcer of left calf with fat layer exposed L97.212 - Non-pressure chronic ulcer of right calf with fat layer exposed R60.0 - Localized edema I89.0 - Lymphedema, not elsewhere classified Bilateral lower extremity venous stasis ulcerations. Procedures Wound #1 Wound #1 is a Venous Leg Ulcer located on the Right,Anterior Lower  Leg . There was a Skin/Subcutaneous Tissue Debridement ([id number]) debridement with total area of 261 sq cm performed by BURNS III, Melanie Crazier., MD. with the following instrument(s): Curette to remove Viable and Non-Viable tissue/material including Fat, Fibrin/Slough, Eschar, and Subcutaneous after achieving pain control using Lidocaine 4% Topical Solution. A time out was conducted prior to the start of the procedure. A Minimum amount of bleeding was controlled with Pressure. The procedure was tolerated well with a pain level of 0 throughout and a pain level of 0 following the procedure. Post Debridement Measurements: 14.5cm length x 18cm width x 0.3cm depth; 61.497cm^3 volume. Post procedure Diagnosis Wound #1: Same as Pre-Procedure Wound #3 Wound #3 is a Venous Leg Ulcer located on the Left,Lateral Lower Leg . There was a Skin/Subcutaneous Tissue Debridement ([id number]) debridement with total area of 31.8 sq cm performed by BURNS III, Melanie Crazier., MD. with the following instrument(s): Curette to remove Viable and Non-Viable tissue/material including Fat, Fibrin/Slough, Eschar, and Subcutaneous after achieving pain control using Lidocaine 4% Topical Solution. A time out was conducted prior to the start of the procedure. A Minimum amount of bleeding was controlled with Pressure. The procedure was tolerated well with a pain level of 0 throughout and a pain level of 0 following the procedure. Post Debridement Measurements: 10.6cm length x 3cm width x 0.5cm depth;  12.488cm^3 volume. Post procedure Diagnosis Wound #3: Same as Pre-Procedure Wound #8 Wound #8 is a Venous Leg Ulcer located on the Right,Posterior Lower Leg . There was a Skin/Subcutaneous Tissue Debridement ([id number]) debridement with total area of 41.25 sq cm performed by BURNS III, Melanie Crazier., MD. with the following instrument(s): Curette to remove Viable and Hotard, Shela H. ([id number]) Non-Viable tissue/material including Fat, Fibrin/Slough, Eschar, and Subcutaneous after achieving pain control using Lidocaine 4% Topical Solution. A time out was conducted prior to the start of the procedure. A Minimum amount of bleeding was controlled with Pressure. The procedure was tolerated well with a pain level of 0 throughout and a pain level of 0 following the procedure. Post Debridement Measurements: 7.5cm length x 5.5cm width x 0.4cm depth; 12.959cm^3 volume. Post procedure Diagnosis Wound #8: Same as Pre-Procedure Wound #9 Wound #9 is a Venous Leg Ulcer located on the Left,Posterior Lower Leg . There was a Skin/Subcutaneous Tissue Debridement ([id number]) debridement with total area of 2.53 sq cm performed by BURNS III, Melanie Crazier., MD. with the following instrument(s): Curette to remove Viable and Non-Viable tissue/material including Fat, Fibrin/Slough, Eschar, and Subcutaneous after achieving pain control using Lidocaine 4% Topical Solution. A time out was conducted prior to the start of the procedure. A Minimum amount of bleeding was controlled with Pressure. The procedure was tolerated well with a pain level of 0 throughout and a pain level of 0 following the procedure. Post Debridement Measurements: 2.3cm length x 1.1cm width x 0.3cm depth; 0.596cm^3 volume. Post procedure Diagnosis Wound #9: Same as Pre-Procedure Plan Wound Cleansing: Wound #1 Right,Anterior Lower Leg: Clean wound with Normal Saline. Cleanse wound with mild soap and water May Shower, gently pat wound dry prior to  applying new dressing. Wound #3 Left,Lateral Lower Leg: Clean wound with Normal Saline. Cleanse wound with mild soap and water May Shower, gently pat wound dry prior to applying new dressing. Wound #8 Right,Posterior Lower Leg: Clean wound with Normal Saline. Cleanse wound with mild soap and water May Shower, gently pat wound dry prior to applying new dressing. Wound #9 Left,Posterior Lower Leg: Clean wound with Normal Saline. Cleanse wound with  mild soap and water May Shower, gently pat wound dry prior to applying new dressing. Anesthetic: Wound #1 Right,Anterior Lower Leg: Topical Lidocaine 4% cream applied to wound bed prior to debridement Wound #3 Left,Lateral Lower Leg: Topical Lidocaine 4% cream applied to wound bed prior to debridement Wound #8 Right,Posterior Lower Leg: Topical Lidocaine 4% cream applied to wound bed prior to debridement Rapaport, Seraiah H. ([id number]) Wound #9 Left,Posterior Lower Leg: Topical Lidocaine 4% cream applied to wound bed prior to debridement Skin Barriers/Peri-Wound Care: Wound #1 Right,Anterior Lower Leg: Barrier cream Wound #3 Left,Lateral Lower Leg: Barrier cream Wound #8 Right,Posterior Lower Leg: Barrier cream Wound #9 Left,Posterior Lower Leg: Barrier cream Primary Wound Dressing: Wound #1 Right,Anterior Lower Leg: Aquacel Ag - or equivalent (No foam) Wound #3 Left,Lateral Lower Leg: Aquacel Ag - or equivalent (No foam) Wound #8 Right,Posterior Lower Leg: Aquacel Ag - or equivalent (No foam) Wound #9 Left,Posterior Lower Leg: Aquacel Ag - or equivalent (No foam) Secondary Dressing: Wound #1 Right,Anterior Lower Leg: ABD pad - please add ABD pad to dressing Wound #3 Left,Lateral Lower Leg: ABD pad - please add ABD pad to dressing Wound #8 Right,Posterior Lower Leg: ABD pad - please add ABD pad to dressing Wound #9 Left,Posterior Lower Leg: ABD pad - please add ABD pad to dressing Dressing Change Frequency: Wound #1  Right,Anterior Lower Leg: Change Dressing Monday, Wednesday, Friday - Wednesday in wound care center Wound #3 Left,Lateral Lower Leg: Change Dressing Monday, Wednesday, Friday - Wednesday in wound care center Wound #8 Right,Posterior Lower Leg: Change Dressing Monday, Wednesday, Friday - Wednesday in wound care center Wound #9 Left,Posterior Lower Leg: Change Dressing Monday, Wednesday, Friday - Wednesday in wound care center Follow-up Appointments: Wound #1 Right,Anterior Lower Leg: Return Appointment in 1 week. Wound #3 Left,Lateral Lower Leg: Return Appointment in 1 week. Wound #8 Right,Posterior Lower Leg: Return Appointment in 1 week. Wound #9 Left,Posterior Lower Leg: Return Appointment in 1 week. Edema Control: Wound #1 Right,Anterior Lower Leg: 3 Layer Compression System - Bilateral - Profore lite Elevate legs to the level of the heart and pump ankles as often as possible - Elevate legs at Dialysis Heyden, Kaelene H. ([id number]) Wound #3 Left,Lateral Lower Leg: 3 Layer Compression System - Bilateral - Profore lite Elevate legs to the level of the heart and pump ankles as often as possible - Elevate legs at Dialysis Wound #8 Right,Posterior Lower Leg: 3 Layer Compression System - Bilateral - Profore lite Elevate legs to the level of the heart and pump ankles as often as possible - Elevate legs at Dialysis Wound #9 Left,Posterior Lower Leg: 3 Layer Compression System - Bilateral - Profore lite Elevate legs to the level of the heart and pump ankles as often as possible - Elevate legs at Dialysis Off-Loading: Wound #1 Right,Anterior Lower Leg: Turn and reposition every 2 hours Wound #3 Left,Lateral Lower Leg: Turn and reposition every 2 hours Wound #8 Right,Posterior Lower Leg: Turn and reposition every 2 hours Wound #9 Left,Posterior Lower Leg: Turn and reposition every 2 hours Additional Orders / Instructions: Wound #1 Right,Anterior Lower Leg: Activity as  tolerated Wound #3 Left,Lateral Lower Leg: Activity as tolerated Wound #8 Right,Posterior Lower Leg: Activity as tolerated Wound #9 Left,Posterior Lower Leg: Activity as tolerated Home Health: Wound #1 Right,Anterior Lower Leg: Continue Home Health Visits - [facility]: Monday, Wednesday and Friday **WRAP GOES FROM BASE OF TOES TO THREE FINGER WIDTHS BENEATH BEND IN KNEE. Home Health Nurse may visit PRN to address patient s wound  care needs. FACE TO FACE ENCOUNTER: MEDICARE and MEDICAID PATIENTS: I certify that this patient is under my care and that I had a face-to-face encounter that meets the physician face-to-face encounter requirements with this patient on this date. The encounter with the patient was in whole or in part for the following MEDICAL CONDITION: (primary reason for Home Healthcare) MEDICAL NECESSITY: I certify, that based on my findings, NURSING services are a medically necessary home health service. HOME BOUND STATUS: I certify that my clinical findings support that this patient is homebound (i.e., Due to illness or injury, pt requires aid of supportive devices such as crutches, cane, wheelchairs, walkers, the use of special transportation or the assistance of another person to leave their place of residence. There is a normal inability to leave the home and doing so requires considerable and taxing effort. Other absences are for medical reasons / religious services and are infrequent or of short duration when for other reasons). If current dressing causes regression in wound condition, may D/C ordered dressing product/s and apply Normal Saline Moist Dressing daily until next Wound Healing Center / Other MD appointment. Notify Wound Healing Center of regression in wound condition at [phone]. Please direct any NON-WOUND related issues/requests for orders to patient's Primary Care Physician Wound #3 Left,Lateral Lower Leg: Continue Home Health Visits - [facility]: Monday,  Wednesday and Friday **WRAP GOES FROM BASE OF TOES TO THREE FINGER WIDTHS BENEATH BEND IN KNEE. Home Health Nurse may visit PRN to address patient s wound care needs. FACE TO FACE ENCOUNTER: MEDICARE and MEDICAID PATIENTS: I certify that this patient is under Sheen, Josanne H. ([id number]) my care and that I had a face-to-face encounter that meets the physician face-to-face encounter requirements with this patient on this date. The encounter with the patient was in whole or in part for the following MEDICAL CONDITION: (primary reason for Home Healthcare) MEDICAL NECESSITY: I certify, that based on my findings, NURSING services are a medically necessary home health service. HOME BOUND STATUS: I certify that my clinical findings support that this patient is homebound (i.e., Due to illness or injury, pt requires aid of supportive devices such as crutches, cane, wheelchairs, walkers, the use of special transportation or the assistance of another person to leave their place of residence. There is a normal inability to leave the home and doing so requires considerable and taxing effort. Other absences are for medical reasons / religious services and are infrequent or of short duration when for other reasons). If current dressing causes regression in wound condition, may D/C ordered dressing product/s and apply Normal Saline Moist Dressing daily until next Wound Healing Center / Other MD appointment. Notify Wound Healing Center of regression in wound condition at [phone]. Please direct any NON-WOUND related issues/requests for orders to patient's Primary Care Physician Wound #8 Right,Posterior Lower Leg: Continue Home Health Visits - [facility]: Monday, Wednesday and Friday **WRAP GOES FROM BASE OF TOES TO THREE FINGER WIDTHS BENEATH BEND IN KNEE. Home Health Nurse may visit PRN to address patient s wound care needs. FACE TO FACE ENCOUNTER: MEDICARE and MEDICAID PATIENTS: I certify that this  patient is under my care and that I had a face-to-face encounter that meets the physician face-to-face encounter requirements with this patient on this date. The encounter with the patient was in whole or in part for the following MEDICAL CONDITION: (primary reason for Home Healthcare) MEDICAL NECESSITY: I certify, that based on my findings, NURSING services are a medically necessary home health service.  HOME BOUND STATUS: I certify that my clinical findings support that this patient is homebound (i.e., Due to illness or injury, pt requires aid of supportive devices such as crutches, cane, wheelchairs, walkers, the use of special transportation or the assistance of another person to leave their place of residence. There is a normal inability to leave the home and doing so requires considerable and taxing effort. Other absences are for medical reasons / religious services and are infrequent or of short duration when for other reasons). If current dressing causes regression in wound condition, may D/C ordered dressing product/s and apply Normal Saline Moist Dressing daily until next Wound Healing Center / Other MD appointment. Notify Wound Healing Center of regression in wound condition at [phone]. Please direct any NON-WOUND related issues/requests for orders to patient's Primary Care Physician Wound #9 Left,Posterior Lower Leg: Continue Home Health Visits - [facility]: Monday, Wednesday and Friday **WRAP GOES FROM BASE OF TOES TO THREE FINGER WIDTHS BENEATH BEND IN KNEE. Home Health Nurse may visit PRN to address patient s wound care needs. FACE TO FACE ENCOUNTER: MEDICARE and MEDICAID PATIENTS: I certify that this patient is under my care and that I had a face-to-face encounter that meets the physician face-to-face encounter requirements with this patient on this date. The encounter with the patient was in whole or in part for the following MEDICAL CONDITION: (primary reason for Home  Healthcare) MEDICAL NECESSITY: I certify, that based on my findings, NURSING services are a medically necessary home health service. HOME BOUND STATUS: I certify that my clinical findings support that this patient is homebound (i.e., Due to illness or injury, pt requires aid of supportive devices such as crutches, cane, wheelchairs, walkers, the use of special transportation or the assistance of another person to leave their place of residence. There is a normal inability to leave the home and doing so requires considerable and taxing effort. Other absences are for medical reasons / religious services and are infrequent or of short duration when for other reasons). If current dressing causes regression in wound condition, may D/C ordered dressing product/s and apply Normal Saline Moist Dressing daily until next Wound Healing Center / Other MD appointment. Notify Wound Healing Center of regression in wound condition at [phone]. Please direct any NON-WOUND related issues/requests for orders to patient's Primary Care Physician ELIYANA, PAGLIARO ([id number]) Silver alginate. Profore light compression bandage 3 times weekly. Recent venous ultrasound results reviewed with patient. Electronic Signature(s) Signed: 07/26/2015 3:52:06 PM By: Madelaine Bhat MD Entered By: Madelaine Bhat on 07/26/2015 13:28:55 Brede, [name] [name] ([id number]) -------------------------------------------------------------------------------- SuperBill Details Patient Name: Caldas, Alicia H. Date of Service: 07/26/2015 Medical Record Patient Account Number: [id number] Number: Treating RN: Curtis Sites 21-Oct-1955 (59 y.o. 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 R60.0 Localized edema I89.0 Lymphedema, not elsewhere classified Facility Procedures CPT4 Code: [id number] Description: 11042 - DEB SUBQ TISSUE 20 SQ CM/< ICD-10 Description Diagnosis L97.222 Non-pressure chronic ulcer of left calf with fat l L97.212 Non-pressure chronic ulcer of right calf with fat Modifier: ayer exposed layer exposed Quantity: 1 CPT4 Code: [id number] Description: 11045 - DEB SUBQ TISS EA ADDL  20CM ICD-10 Description Diagnosis L97.222 Non-pressure chronic ulcer of left calf with fat l L97.212 Non-pressure chronic ulcer of right calf with fat Modifier: ayer exposed layer exposed Quantity: 16 Physician Procedures CPT4 Code: 1610960 Description: 11042 - WC PHYS SUBQ TISS 20 SQ CM ICD-10 Description Diagnosis L97.222 Non-pressure chronic ulcer of left calf with fat la L97.212 Non-pressure chronic ulcer of right calf with fat l Modifier: yer exposed ayer exposed Quantity: 1 CPT4 Code: [id number] Woolever, PAM Description: 11045 - WC PHYS SUBQ TISS EA ADDL 20 CM ELA H. ([id number]) Modifier: Quantity: 16 Electronic Signature(s) Signed: 07/26/2015 3:52:06 PM By: Madelaine Bhat MD Entered By: Madelaine Bhat on 07/26/2015 13:29:17

## 2015-07-27 NOTE — Progress Notes (Signed)
[name], [name] ([id number]) Visit Report for 07/26/2015 Arrival Information Details Patient Name: [name], [name]. Date of Service: 07/26/2015 8:00 AM Medical Record Patient Account Number: [id number] Number: Treating RN: Curtis Sites [birth year] (60 y.o. Other Clinician: Date of Birth/Sex: Female) Treating BURNS III, Primary Care Physician: Fidel Levy Physician/Extender: Zollie Beckers Referring Physician: Jones Broom in Treatment: 25 Visit Information History Since Last Visit Added or deleted any medications: No Patient Arrived: Ambulatory Any new allergies or adverse reactions: No Arrival Time: 08:14 Had a fall or experienced change in No Accompanied By: self activities of daily living that may affect Transfer Assistance: None risk of falls: Patient Identification Verified: Yes Signs or symptoms of abuse/neglect since last No Secondary Verification Process Yes visito Completed: Hospitalized since last visit: No Patient Requires Transmission- No Pain Present Now: No Based Precautions: Patient Has Alerts: Yes Patient Alerts: DMII ABI Daniel Bilateral Electronic Signature(s) Signed: 07/26/2015 5:21:59 PM By: Curtis Sites Entered By: Curtis Sites on 07/26/2015 08:14:34 Woodfin, Yicel Rexene Edison ([id number]) -------------------------------------------------------------------------------- Encounter Discharge Information Details Patient Name: [name], [name] Date of Service: 07/26/2015 8:00 AM Medical Record Patient Account Number: [id number] Number: Treating RN: Curtis Sites 1956-04-30 (60 y.o. Other Clinician: Date of Birth/Sex: Female) Treating BURNS III, Primary Care Physician: Fidel Levy Physician/Extender: Zollie Beckers Referring Physician: Jones Broom in Treatment: 25 Encounter Discharge Information Items Discharge Pain Level: 0 Discharge Condition: Stable Ambulatory Status: Ambulatory Discharge Destination:  Home Transportation: Private Auto Accompanied By: self Schedule Follow-up Appointment: Yes Medication Reconciliation completed and provided to Patient/Care No Blandon Offerdahl: Provided on Clinical Summary of Care: 07/26/2015 Form Type Recipient Paper Patient PM Electronic Signature(s) Signed: 07/26/2015 9:24:40 AM By: Gwenlyn Perking Entered By: Gwenlyn Perking on 07/26/2015 09:24:40 Hackman, Kanyia H. ([id number]) -------------------------------------------------------------------------------- Lower Extremity Assessment Details Patient Name: [name], [name] Date of Service: 07/26/2015 8:00 AM Medical Record Patient Account Number: [id number] Number: Treating RN: Curtis Sites 04-Feb-1956 (60 y.o. Other Clinician: Date of Birth/Sex: Female) Treating BURNS III, Primary Care Physician: Fidel Levy Physician/Extender: Zollie Beckers Referring Physician: Jones Broom in Treatment: 25 Edema Assessment Assessed: [Left: No] [Right: No] Edema: [Left: Yes] [Right: Yes] Calf Left: Right: Point of Measurement: 34 cm From Medial Instep 46 cm 45.4 cm Ankle Left: Right: Point of Measurement: 10 cm From Medial Instep 26.4 cm 24.8 cm Vascular Assessment Pulses: Posterior Tibial Dorsalis Pedis Palpable: [Left:Yes] [Right:Yes] Extremity colors, hair growth, and conditions: Extremity Color: [Left:Hyperpigmented] [Right:Hyperpigmented] Hair Growth on Extremity: [Left:No] [Right:No] Temperature of Extremity: [Left:Warm] [Right:Warm] Capillary Refill: [Left:< 3 seconds] [Right:< 3 seconds] Electronic Signature(s) Signed: 07/26/2015 5:21:59 PM By: Curtis Sites Entered By: Curtis Sites on 07/26/2015 08:37:34 Becka, Arthurine H. ([id number]) -------------------------------------------------------------------------------- Multi Wound Chart Details Patient Name: [name], [name] Date of Service: 07/26/2015 8:00 AM Medical Record Patient Account Number:  [id number] Number: Treating RN: Curtis Sites [birth year] (60 y.o. Other Clinician: Date of Birth/Sex: Female) Treating BURNS III, Primary Care Physician: Fidel Levy Physician/Extender: Zollie Beckers Referring Physician: Jones Broom in Treatment: 25 Vital Signs Height(in): 68 Pulse(bpm): 89 Weight(lbs): 294 Blood Pressure 149/76 (mmHg): Body Mass Index(BMI): 45 Temperature(F): 98.2 Respiratory Rate 18 (breaths/min): Photos: [1:No Photos] [3:No Photos] [8:No Photos] Wound Location: [1:Right Lower Leg - Anterior Left Lower Leg - Lateral] [8:Right Lower Leg - Posterior] Wounding Event: [1:Gradually Appeared] [3:Gradually Appeared] [8:Gradually Appeared] Primary Etiology: [1:Venous Leg Ulcer] [3:Venous Leg Ulcer] [8:Venous Leg Ulcer] Comorbid History: [1:Hypertension, Type II Diabetes] [3:Hypertension, Type II Diabetes] [8:Hypertension, Type II Diabetes] Date Acquired: [1:01/09/2015] [  3:01/09/2015] [8:03/27/2015] Weeks of Treatment: [1:25] [3:25] [8:16] Wound Status: [1:Open] [3:Open] [8:Open] Measurements L x W x D 14.5x18x0.2 [3:10.6x3x0.4] [8:7.5x5.5x0.3] (cm) Area (cm) : [1:204.989] [3:24.976] [8:32.398] Volume (cm) : [1:40.998] [3:9.99] [8:9.719] % Reduction in Area: [1:-436046.80%] [3:-127.10%] [8:-1733.50%] % Reduction in Volume: -819860.00% [3:-808.20%] [8:-5391.00%] Classification: [1:Full Thickness Without Exposed Support Structures] [3:Full Thickness Without Exposed Support Structures] [8:Full Thickness Without Exposed Support Structures] HBO Classification: [1:Grade 2] [3:Grade 1] [8:Grade 1] Exudate Amount: [1:Large] [3:Large] [8:Large] Exudate Type: [1:Serous] [3:Serous] [8:Serous] Exudate Color: [1:amber] [3:amber] [8:amber] Wound Margin: [1:Thickened] [3:Flat and Intact] [8:Flat and Intact] Granulation Amount: [1:Medium (34-66%)] [3:Medium (34-66%)] [8:Medium (34-66%)] Granulation Quality: [1:Pink] [3:Red] [8:Red,  Hyper-granulation] Necrotic Amount: [1:Medium (34-66%)] [3:Medium (34-66%)] [8:Medium (34-66%)] Necrotic Tissue: [1:Eschar, Adherent Slough] [3:Adherent Slough] [8:Eschar, Adherent Slough] Exposed Structures: Fascia: No Fascia: No Fascia: No Fat: No Fat: No Fat: No Tendon: No Tendon: No Tendon: No Muscle: No Muscle: No Muscle: No Joint: No Joint: No Joint: No Bone: No Bone: No Bone: No Limited to Skin Limited to Skin Limited to Skin Breakdown Breakdown Breakdown Epithelialization: None None None Periwound Skin Texture: Scarring: Yes Scarring: Yes Edema: Yes Edema: No Edema: No Excoriation: No Excoriation: No Excoriation: No Induration: No Induration: No Induration: No Callus: No Callus: No Callus: No Crepitus: No Crepitus: No Crepitus: No Fluctuance: No Fluctuance: No Fluctuance: No Friable: No Friable: No Friable: No Rash: No Rash: No Rash: No Scarring: No Periwound Skin Moist: Yes Moist: Yes Moist: Yes Moisture: Maceration: No Maceration: No Maceration: No Dry/Scaly: No Dry/Scaly: No Dry/Scaly: No Periwound Skin Color: Atrophie Blanche: No Atrophie Blanche: No Atrophie Blanche: No Cyanosis: No Cyanosis: No Cyanosis: No Ecchymosis: No Ecchymosis: No Ecchymosis: No Erythema: No Erythema: No Erythema: No Hemosiderin Staining: No Hemosiderin Staining: No Hemosiderin Staining: No Mottled: No Mottled: No Mottled: No Pallor: No Pallor: No Pallor: No Rubor: No Rubor: No Rubor: No Temperature: No Abnormality No Abnormality No Abnormality Tenderness on Yes Yes Yes Palpation: Wound Preparation: Ulcer Cleansing: Other: Ulcer Cleansing: Other: Ulcer Cleansing: Other: soap and water soap and water soap and water Topical Anesthetic Topical Anesthetic Topical Anesthetic Applied: Other: lidocaine Applied: Other: lidocaine Applied: Other: lidocaine 4% 4% 4% Wound Number: 9 N/A N/A Photos: No Photos N/A N/A Wound Location: Left Lower Leg -  Posterior N/A N/A Wounding Event: Gradually Appeared N/A N/A Primary Etiology: Venous Leg Ulcer N/A N/A Comorbid History: Hypertension, Type II N/A N/A Diabetes Date Acquired: 03/13/2015 N/A N/A Weeks of Treatment: 13 N/A N/A Wound Status: Open N/A N/A 2.3x1.1x0.2 N/A N/A Halk, Carline H. ([id number]) Measurements L x W x D (cm) Area (cm) : 1.987 N/A N/A Volume (cm) : 0.397 N/A N/A % Reduction in Area: 87.40% N/A N/A % Reduction in Volume: 87.40% N/A N/A Classification: Full Thickness Without N/A N/A Exposed Support Structures HBO Classification: Grade 2 N/A N/A Exudate Amount: Large N/A N/A Exudate Type: Serous N/A N/A Exudate Color: amber N/A N/A Wound Margin: Thickened N/A N/A Granulation Amount: Small (1-33%) N/A N/A Granulation Quality: Pink N/A N/A Necrotic Amount: Large (67-100%) N/A N/A Necrotic Tissue: Adherent Slough N/A N/A Exposed Structures: N/A N/A N/A Epithelialization: None N/A N/A Periwound Skin Texture: Scarring: Yes N/A N/A Periwound Skin Moist: Yes N/A N/A Moisture: Periwound Skin Color: No Abnormalities Noted N/A N/A Temperature: No Abnormality N/A N/A Tenderness on Yes N/A N/A Palpation: Wound Preparation: Ulcer Cleansing: N/A N/A Rinsed/Irrigated with Saline Topical Anesthetic Applied: Other: lidocaine 4% Treatment Notes Electronic Signature(s) Signed: 07/26/2015 5:21:59 PM By: Curtis Sites Entered By: Curtis Sites  on 07/26/2015 08:52:11 Sigel, Aaron EdelmanMELA H. ([id number]) -------------------------------------------------------------------------------- Multi-Disciplinary Care Plan Details Patient Name: [name], [name] Date of Service: 07/26/2015 8:00 AM Medical Record Patient Account Number: [id number] Number: Treating RN: Curtis SitesDorthy, Joanna 1956/04/25 (59 y.o. Other Clinician: Date of Birth/Sex: Female) Treating BURNS III, Primary Care Physician: Fidel LevyHAWKINS JR, JAMES Physician/Extender: Zollie BeckersWALTER Referring Physician: Jones BroomHAWKINS  JR, JAMES Weeks in Treatment: 25 Active Inactive Orientation to the Wound Care Program Nursing Diagnoses: Knowledge deficit related to the wound healing center program Goals: Patient/caregiver will verbalize understanding of the Wound Healing Center Program Date Initiated: 01/30/2015 Goal Status: Active Interventions: Provide education on orientation to the wound center Notes: Pain, Acute or Chronic Nursing Diagnoses: Pain, acute or chronic: actual or potential Goals: Patient will verbalize adequate pain control and receive pain control interventions during procedures as needed Date Initiated: 01/30/2015 Goal Status: Active Patient/caregiver will verbalize adequate pain control between visits Date Initiated: 01/30/2015 Goal Status: Active Interventions: Assess comfort goal upon admission Encourage patient to take pain medications as prescribed Notes: Venous Leg Ulcer Mehlhaff, Yukiko H. ([id number]) Nursing Diagnoses: Potential for venous Insuffiency (use before diagnosis confirmed) Goals: Non-invasive venous studies are completed as ordered Date Initiated: 01/30/2015 Goal Status: Active Interventions: Assess peripheral edema status every visit. Notes: Wound/Skin Impairment Nursing Diagnoses: Impaired tissue integrity Goals: Ulcer/skin breakdown will have a volume reduction of 30% by week 4 Date Initiated: 01/30/2015 Goal Status: Active Ulcer/skin breakdown will have a volume reduction of 50% by week 8 Date Initiated: 01/30/2015 Goal Status: Active Ulcer/skin breakdown will have a volume reduction of 80% by week 12 Date Initiated: 01/30/2015 Goal Status: Active Ulcer/skin breakdown will heal within 14 weeks Date Initiated: 01/30/2015 Goal Status: Active Interventions: Assess ulceration(s) every visit Notes: Electronic Signature(s) Signed: 07/26/2015 5:21:59 PM By: Curtis Sitesorthy, Joanna Entered By: Curtis Sitesorthy, Joanna on 07/26/2015 08:44:01 Stanwood, Aaron EdelmanPAMELA H.  ([id number]) -------------------------------------------------------------------------------- Patient/Caregiver Education Details Patient Name: Herrin, Akshita H. Date of Service: 07/26/2015 8:00 AM Medical Record Patient Account Number: [id number] Number: Treating RN: Curtis SitesDorthy, Joanna 1956/04/25 (59 y.o. Other Clinician: Date of Birth/Gender: Female) Treating BURNS III, Primary Care Physician: Fidel LevyHAWKINS JR, JAMES Physician/Extender: Zollie BeckersWALTER Referring Physician: Jones BroomHAWKINS JR, JAMES Weeks in Treatment: 25 Education Assessment Education Provided To: Patient Education Topics Provided Venous: Handouts: Other: continue wraps for wound care Methods: Explain/Verbal Responses: State content correctly Electronic Signature(s) Signed: 07/26/2015 5:21:59 PM By: Curtis Sitesorthy, Joanna Entered By: Curtis Sitesorthy, Joanna on 07/26/2015 09:22:55 [name], [name] ([id number]) -------------------------------------------------------------------------------- Wound Assessment Details Patient Name: [name], [name] Date of Service: 07/26/2015 8:00 AM Medical Record Patient Account Number: [id number] Number: Treating RN: Curtis SitesDorthy, Joanna 1956/04/25 (59 y.o. Other Clinician: Date of Birth/Sex: Female) Treating BURNS III, Primary Care Physician: Fidel LevyHAWKINS JR, JAMES Physician/Extender: Zollie BeckersWALTER Referring Physician: Jones BroomHAWKINS JR, JAMES Weeks in Treatment: 25 Wound Status Wound Number: 1 Primary Etiology: Venous Leg Ulcer Wound Location: Right Lower Leg - Anterior Wound Status: Open Wounding Event: Gradually Appeared Comorbid History: Hypertension, Type II Diabetes Date Acquired: 01/09/2015 Weeks Of Treatment: 25 Clustered Wound: No Photos Photo Uploaded By: Curtis Sitesorthy, Joanna on 07/26/2015 14:39:47 Wound Measurements Length: (cm) 14.5 Width: (cm) 18 Depth: (cm) 0.2 Area: (cm) 204.989 Volume: (cm) 40.998 % Reduction in Area: -436046.8% % Reduction in Volume: -819860% Epithelialization:  None Tunneling: No Undermining: No Wound Description Full Thickness Without Foul Odor After Classification: Exposed Support Structures Diabetic Severity Grade 2 (Wagner): Wound Margin: Thickened Exudate Amount: Large Exudate Type: Serous Exudate Color: amber Cleansing: No Wound Bed Shehadeh, Kaly H. ([id number]) Granulation Amount: Medium (34-66%) Exposed Structure Granulation Quality: Pink  Fascia Exposed: No Necrotic Amount: Medium (34-66%) Fat Layer Exposed: No Necrotic Quality: Eschar, Adherent Slough Tendon Exposed: No Muscle Exposed: No Joint Exposed: No Bone Exposed: No Limited to Skin Breakdown Periwound Skin Texture Texture Color No Abnormalities Noted: No No Abnormalities Noted: No Callus: No Atrophie Blanche: No Crepitus: No Cyanosis: No Excoriation: No Ecchymosis: No Fluctuance: No Erythema: No Friable: No Hemosiderin Staining: No Induration: No Mottled: No Localized Edema: No Pallor: No Rash: No Rubor: No Scarring: Yes Temperature / Pain Moisture Temperature: No Abnormality No Abnormalities Noted: No Tenderness on Palpation: Yes Dry / Scaly: No Maceration: No Moist: Yes Wound Preparation Ulcer Cleansing: Other: soap and water, Topical Anesthetic Applied: Other: lidocaine 4%, Treatment Notes Wound #1 (Right, Anterior Lower Leg) 1. Cleansed with: Cleanse wound with antibacterial soap and water 2. Anesthetic Topical Lidocaine 4% cream to wound bed prior to debridement 4. Dressing Applied: Aquacel Ag 5. Secondary Dressing Applied ABD Pad 7. Secured with 3 Layer Compression System - Bilateral Electronic Signature(s) Signed: 07/26/2015 5:21:59 PM By: Vilma Prader, Tawn Rexene Edison ([id number]) Entered By: Curtis Sites on 07/26/2015 08:42:23 Modi, Margaretha Rexene Edison ([id number]) -------------------------------------------------------------------------------- Wound Assessment Details Patient Name: [name], [name] Date of Service:  07/26/2015 8:00 AM Medical Record Patient Account Number: [id number] Number: Treating RN: Curtis Sites September 04, 1955 (59 y.o. Other Clinician: Date of Birth/Sex: Female) Treating BURNS III, Primary Care Physician: Fidel Levy Physician/Extender: Zollie Beckers Referring Physician: Jones Broom in Treatment: 25 Wound Status Wound Number: 3 Primary Etiology: Venous Leg Ulcer Wound Location: Left Lower Leg - Lateral Wound Status: Open Wounding Event: Gradually Appeared Comorbid History: Hypertension, Type II Diabetes Date Acquired: 01/09/2015 Weeks Of Treatment: 25 Clustered Wound: No Photos Photo Uploaded By: Curtis Sites on 07/26/2015 14:39:47 Wound Measurements Length: (cm) 10.6 Width: (cm) 3 Depth: (cm) 0.4 Area: (cm) 24.976 Volume: (cm) 9.99 % Reduction in Area: -127.1% % Reduction in Volume: -808.2% Epithelialization: None Tunneling: No Undermining: No Wound Description Full Thickness Without Foul Odor After Classification: Exposed Support Structures Diabetic Severity Grade 1 (Wagner): Wound Margin: Flat and Intact Exudate Amount: Large Exudate Type: Serous Exudate Color: amber Cleansing: No Wound Bed Winsett, Kinslee H. ([id number]) Granulation Amount: Medium (34-66%) Exposed Structure Granulation Quality: Red Fascia Exposed: No Necrotic Amount: Medium (34-66%) Fat Layer Exposed: No Necrotic Quality: Adherent Slough Tendon Exposed: No Muscle Exposed: No Joint Exposed: No Bone Exposed: No Limited to Skin Breakdown Periwound Skin Texture Texture Color No Abnormalities Noted: No No Abnormalities Noted: No Callus: No Atrophie Blanche: No Crepitus: No Cyanosis: No Excoriation: No Ecchymosis: No Fluctuance: No Erythema: No Friable: No Hemosiderin Staining: No Induration: No Mottled: No Localized Edema: No Pallor: No Rash: No Rubor: No Scarring: Yes Temperature / Pain Moisture Temperature: No Abnormality No  Abnormalities Noted: No Tenderness on Palpation: Yes Dry / Scaly: No Maceration: No Moist: Yes Wound Preparation Ulcer Cleansing: Other: soap and water, Topical Anesthetic Applied: Other: lidocaine 4%, Treatment Notes Wound #3 (Left, Lateral Lower Leg) 1. Cleansed with: Cleanse wound with antibacterial soap and water 2. Anesthetic Topical Lidocaine 4% cream to wound bed prior to debridement 4. Dressing Applied: Aquacel Ag 5. Secondary Dressing Applied ABD Pad 7. Secured with 3 Layer Compression System - Bilateral Electronic Signature(s) Signed: 07/26/2015 5:21:59 PM By: Vilma Prader, Sharetta Rexene Edison ([id number]) Entered By: Curtis Sites on 07/26/2015 08:42:39 Marulanda, Sanvi Rexene Edison ([id number]) -------------------------------------------------------------------------------- Wound Assessment Details Patient Name: [name], [name] Date of Service: 07/26/2015 8:00 AM Medical Record Patient Account Number: [id number] Number: Treating RN: Curtis Sites  18-Feb-1956 (60 y.o. Other Clinician: Date of Birth/Sex: Female) Treating BURNS III, Primary Care Physician: Fidel Levy Physician/Extender: Zollie Beckers Referring Physician: Jones Broom in Treatment: 25 Wound Status Wound Number: 8 Primary Etiology: Venous Leg Ulcer Wound Location: Right Lower Leg - Posterior Wound Status: Open Wounding Event: Gradually Appeared Comorbid History: Hypertension, Type II Diabetes Date Acquired: 03/27/2015 Weeks Of Treatment: 16 Clustered Wound: No Photos Photo Uploaded By: Curtis Sites on 07/26/2015 14:40:23 Wound Measurements Length: (cm) 7.5 Width: (cm) 5.5 Depth: (cm) 0.3 Area: (cm) 32.398 Volume: (cm) 9.719 % Reduction in Area: -1733.5% % Reduction in Volume: -5391% Epithelialization: None Tunneling: No Undermining: No Wound Description Full Thickness Without Foul Odor After Classification: Exposed Support Structures Diabetic Severity Grade  1 (Wagner): Wound Margin: Flat and Intact Exudate Amount: Large Exudate Type: Serous Exudate Color: amber Cleansing: No Wound Bed Apt, Charity H. ([id number]) Granulation Amount: Medium (34-66%) Exposed Structure Granulation Quality: Red, Hyper-granulation Fascia Exposed: No Necrotic Amount: Medium (34-66%) Fat Layer Exposed: No Necrotic Quality: Eschar, Adherent Slough Tendon Exposed: No Muscle Exposed: No Joint Exposed: No Bone Exposed: No Limited to Skin Breakdown Periwound Skin Texture Texture Color No Abnormalities Noted: No No Abnormalities Noted: No Callus: No Atrophie Blanche: No Crepitus: No Cyanosis: No Excoriation: No Ecchymosis: No Fluctuance: No Erythema: No Friable: No Hemosiderin Staining: No Induration: No Mottled: No Localized Edema: Yes Pallor: No Rash: No Rubor: No Scarring: No Temperature / Pain Moisture Temperature: No Abnormality No Abnormalities Noted: No Tenderness on Palpation: Yes Dry / Scaly: No Maceration: No Moist: Yes Wound Preparation Ulcer Cleansing: Other: soap and water, Topical Anesthetic Applied: Other: lidocaine 4%, Treatment Notes Wound #8 (Right, Posterior Lower Leg) 1. Cleansed with: Cleanse wound with antibacterial soap and water 2. Anesthetic Topical Lidocaine 4% cream to wound bed prior to debridement 4. Dressing Applied: Aquacel Ag 5. Secondary Dressing Applied ABD Pad 7. Secured with 3 Layer Compression System - Bilateral Electronic Signature(s) Signed: 07/26/2015 5:21:59 PM By: Vilma Prader, Tanda Rexene Edison ([id number]) Entered By: Curtis Sites on 07/26/2015 08:43:08 [name], [name] ([id number]) -------------------------------------------------------------------------------- Wound Assessment Details Patient Name: [name], [name] Date of Service: 07/26/2015 8:00 AM Medical Record Patient Account Number: [id number] Number: Treating RN: Curtis Sites 10/31/1955 (59 y.o.  Other Clinician: Date of Birth/Sex: Female) Treating BURNS III, Primary Care Physician: Fidel Levy Physician/Extender: Zollie Beckers Referring Physician: Jones Broom in Treatment: 25 Wound Status Wound Number: 9 Primary Etiology: Venous Leg Ulcer Wound Location: Left Lower Leg - Posterior Wound Status: Open Wounding Event: Gradually Appeared Comorbid History: Hypertension, Type II Diabetes Date Acquired: 03/13/2015 Weeks Of Treatment: 13 Clustered Wound: No Photos Photo Uploaded By: Curtis Sites on 07/26/2015 14:40:23 Wound Measurements Length: (cm) 2.3 Width: (cm) 1.1 Depth: (cm) 0.2 Area: (cm) 1.987 Volume: (cm) 0.397 % Reduction in Area: 87.4% % Reduction in Volume: 87.4% Epithelialization: None Tunneling: No Undermining: No Wound Description Full Thickness Without Classification: Exposed Support Structures Diabetic Severity Grade 2 (Wagner): Wound Margin: Thickened Exudate Amount: Large Exudate Type: Serous Exudate Color: amber Foul Odor After Cleansing: No Wound Bed Carbary, Thaily H. ([id number]) Granulation Amount: Small (1-33%) Granulation Quality: Pink Necrotic Amount: Large (67-100%) Necrotic Quality: Adherent Slough Periwound Skin Texture Texture Color No Abnormalities Noted: No No Abnormalities Noted: No Scarring: Yes Temperature / Pain Moisture Temperature: No Abnormality No Abnormalities Noted: No Tenderness on Palpation: Yes Moist: Yes Wound Preparation Ulcer Cleansing: Rinsed/Irrigated with Saline Topical Anesthetic Applied: Other: lidocaine 4%, Treatment Notes Wound #9 (Left, Posterior Lower Leg) 1. Cleansed  with: Cleanse wound with antibacterial soap and water 2. Anesthetic Topical Lidocaine 4% cream to wound bed prior to debridement 4. Dressing Applied: Aquacel Ag 5. Secondary Dressing Applied ABD Pad 7. Secured with 3 Layer Compression System - Bilateral Electronic Signature(s) Signed: 07/26/2015 5:21:59 PM  By: Curtis Sites Entered By: Curtis Sites on 07/26/2015 08:43:41 Russon, Averiana Rexene Edison ([id number]) -------------------------------------------------------------------------------- Vitals Details Patient Name: [name], [name] Date of Service: 07/26/2015 8:00 AM Medical Record Patient Account Number: [id number] Number: Treating RN: Curtis Sites 05-02-1956 (59 y.o. Other Clinician: Date of Birth/Sex: Female) Treating BURNS III, Primary Care Physician: Fidel Levy Physician/Extender: Zollie Beckers Referring Physician: Jones Broom in Treatment: 25 Vital Signs Time Taken: 08:18 Temperature (F): 98.2 Height (in): 68 Pulse (bpm): 89 Weight (lbs): 294 Respiratory Rate (breaths/min): 18 Body Mass Index (BMI): 44.7 Blood Pressure (mmHg): 149/76 Reference Range: 80 - 120 mg / dl Electronic Signature(s) Signed: 07/26/2015 5:21:59 PM By: Curtis Sites Entered By: Curtis Sites on 07/26/2015 08:18:35

## 2015-08-02 ENCOUNTER — Encounter: Payer: BC Managed Care – PPO | Admitting: Surgery

## 2015-08-02 DIAGNOSIS — L97212 Non-pressure chronic ulcer of right calf with fat layer exposed: Secondary | ICD-10-CM | POA: Diagnosis not present

## 2015-08-02 DIAGNOSIS — Z992 Dependence on renal dialysis: Secondary | ICD-10-CM | POA: Diagnosis not present

## 2015-08-02 DIAGNOSIS — E11622 Type 2 diabetes mellitus with other skin ulcer: Secondary | ICD-10-CM | POA: Diagnosis not present

## 2015-08-02 DIAGNOSIS — R600 Localized edema: Secondary | ICD-10-CM | POA: Diagnosis not present

## 2015-08-02 DIAGNOSIS — N186 End stage renal disease: Secondary | ICD-10-CM | POA: Diagnosis not present

## 2015-08-02 DIAGNOSIS — L97222 Non-pressure chronic ulcer of left calf with fat layer exposed: Secondary | ICD-10-CM | POA: Diagnosis not present

## 2015-08-03 NOTE — Progress Notes (Signed)
DEANNDRA, KIRLEY ([id number]) Visit Report for 08/02/2015 Arrival Information Details Patient Name: [name], [name]. Date of Service: 08/02/2015 8:00 AM Medical Record Patient Account Number: [id number] Number: Treating RN: Phillis Haggis [birth year] (59 y.o. Other Clinician: Date of Birth/Sex: Female) Treating BURNS III, Primary Care Physician: Fidel Levy Physician/Extender: Zollie Beckers Referring Physician: Jones Broom in Treatment: 26 Visit Information History Since Last Visit All ordered tests and consults were completed: No Patient Arrived: Ambulatory Added or deleted any medications: No Arrival Time: 08:08 Any new allergies or adverse reactions: No Accompanied By: self Had a fall or experienced change in No Transfer Assistance: None activities of daily living that may affect Patient Identification Verified: Yes risk of falls: Secondary Verification Process Yes Signs or symptoms of abuse/neglect since last No Completed: visito Patient Requires Transmission- No Hospitalized since last visit: No Based Precautions: Pain Present Now: No Patient Has Alerts: Yes Patient Alerts: DMII ABI [place]ott Bilateral Electronic Signature(s) Signed: 08/02/2015 5:35:56 PM By: Alejandro Mulling Entered By: Alejandro Mulling on 08/02/2015 08:09:28 Montel, [name] ([id number]) -------------------------------------------------------------------------------- Encounter Discharge Information Details Patient Name: [name], [name] Date of Service: 08/02/2015 8:00 AM Medical Record Patient Account Number: [id number] Number: Treating RN: Phillis Haggis [birth year] (59 y.o. Other Clinician: Date of Birth/Sex: Female) Treating BURNS III, Primary Care Physician: Fidel Levy Physician/Extender: Zollie Beckers Referring Physician: Jones Broom in Treatment: 89 Encounter Discharge Information Items Discharge Pain Level: 0 Discharge Condition:  Stable Ambulatory Status: Ambulatory Discharge Destination: Home Transportation: Private Auto Accompanied By: self Schedule Follow-up Appointment: Yes Medication Reconciliation completed and provided to Patient/Care Yes Winford Hehn: Provided on Clinical Summary of Care: 08/02/2015 Form Type Recipient Paper Patient PM Electronic Signature(s) Signed: 08/02/2015 9:15:21 AM By: Gwenlyn Perking Entered By: Gwenlyn Perking on 08/02/2015 09:15:20 [name], [name] ([id number]) -------------------------------------------------------------------------------- Lower Extremity Assessment Details Patient Name: [name], [name] Date of Service: 08/02/2015 8:00 AM Medical Record Patient Account Number: [id number] Number: Treating RN: Phillis Haggis Oct 28, 1955 (59 y.o. Other Clinician: Date of Birth/Sex: Female) Treating BURNS III, Primary Care Physician: Fidel Levy Physician/Extender: Zollie Beckers Referring Physician: Jones Broom in Treatment: 26 Edema Assessment Assessed: [Left: No] [Right: No] Edema: [Left: Yes] [Right: Yes] Calf Left: Right: Point of Measurement: cm From Medial Instep 49.5 cm 46 cm Ankle Left: Right: Point of Measurement: cm From Medial Instep 24.5 cm 24 cm Vascular Assessment Pulses: Posterior Tibial Dorsalis Pedis Palpable: [Left:Yes] [Right:Yes] Extremity colors, hair growth, and conditions: Extremity Color: [Left:Red] [Right:Red] Hair Growth on Extremity: [Left:No] [Right:No] Temperature of Extremity: [Left:Warm] [Right:Warm] Capillary Refill: [Left:< 3 seconds] [Right:< 3 seconds] Toe Nail Assessment Left: Right: Thick: No No Discolored: No No Deformed: No No Improper Length and Hygiene: No No Electronic Signature(s) Signed: 08/02/2015 5:35:56 PM By: Alejandro Mulling Entered By: Alejandro Mulling on 08/02/2015 08:20:22 Dominik, [name] ([id number]) Hupfer, [name]  ([id number]) -------------------------------------------------------------------------------- Multi Wound Chart Details Patient Name: [name], [name] Date of Service: 08/02/2015 8:00 AM Medical Record Patient Account Number: [id number] Number: Treating RN: Phillis Haggis 04-24-1956 (59 y.o. Other Clinician: Date of Birth/Sex: Female) Treating BURNS III, Primary Care Physician: Fidel Levy Physician/Extender: Zollie Beckers Referring Physician: Jones Broom in Treatment: 26 Vital Signs Height(in): 68 Pulse(bpm): 89 Weight(lbs): 294 Blood Pressure 125/62 (mmHg): Body Mass Index(BMI): 45 Temperature(F): 97.7 Respiratory Rate 20 (breaths/min): Photos: [1:No Photos] [3:No Photos] [8:No Photos] Wound Location: [1:Right Lower Leg - Anterior Left Lower Leg - Lateral] [8:Right Lower Leg - Posterior] Wounding Event: [1:Gradually Appeared] [3:Gradually Appeared] [  8:Gradually Appeared] Primary Etiology: [1:Venous Leg Ulcer] [3:Venous Leg Ulcer] [8:Venous Leg Ulcer] Comorbid History: [1:Hypertension, Type II Diabetes] [3:Hypertension, Type II Diabetes] [8:Hypertension, Type II Diabetes] Date Acquired: [1:01/09/2015] [3:01/09/2015] [8:03/27/2015] Weeks of Treatment: [1:26] [3:26] [8:17] Wound Status: [1:Open] [3:Open] [8:Open] Measurements L x W x D 20x21x0.3 [3:13.5x7.5x0.4] [8:7.4x6x0.3] (cm) Area (cm) : [1:329.867] [3:79.522] [8:34.872] Volume (cm) : [1:98.96] [3:31.809] [8:10.462] % Reduction in Area: [1:-701744.70%] [3:-623.20%] [8:-1873.50%] % Reduction in Volume: -1979100.00% [3:-2791.70%] [8:-5810.70%] Classification: [1:Full Thickness Without Exposed Support Structures] [3:Full Thickness Without Exposed Support Structures] [8:Full Thickness Without Exposed Support Structures] HBO Classification: [1:Grade 2] [3:Grade 1] [8:Grade 1] Exudate Amount: [1:Large] [3:Large] [8:Large] Exudate Type: [1:Serous] [3:Serous] [8:Serous] Exudate Color: [1:amber]  [3:amber] [8:amber] Wound Margin: [1:Thickened] [3:Flat and Intact] [8:Flat and Intact] Granulation Amount: [1:Medium (34-66%)] [3:Medium (34-66%)] [8:Medium (34-66%)] Granulation Quality: [1:Pink] [3:Red] [8:Red, Hyper-granulation] Necrotic Amount: [1:Medium (34-66%)] [3:Medium (34-66%)] [8:Medium (34-66%)] Necrotic Tissue: [1:Eschar, Adherent Slough] [3:Adherent Slough] [8:Eschar, Adherent Slough] Exposed Structures: Fascia: No Fascia: No Fascia: No Fat: No Fat: No Fat: No Tendon: No Tendon: No Tendon: No Muscle: No Muscle: No Muscle: No Joint: No Joint: No Joint: No Bone: No Bone: No Bone: No Limited to Skin Limited to Skin Limited to Skin Breakdown Breakdown Breakdown Epithelialization: None None None Periwound Skin Texture: Scarring: Yes Scarring: Yes Edema: Yes Edema: No Edema: No Excoriation: No Excoriation: No Excoriation: No Induration: No Induration: No Induration: No Callus: No Callus: No Callus: No Crepitus: No Crepitus: No Crepitus: No Fluctuance: No Fluctuance: No Fluctuance: No Friable: No Friable: No Friable: No Rash: No Rash: No Rash: No Scarring: No Periwound Skin Moist: Yes Moist: Yes Moist: Yes Moisture: Maceration: No Maceration: No Maceration: No Dry/Scaly: No Dry/Scaly: No Dry/Scaly: No Periwound Skin Color: Atrophie Blanche: No Atrophie Blanche: No Atrophie Blanche: No Cyanosis: No Cyanosis: No Cyanosis: No Ecchymosis: No Ecchymosis: No Ecchymosis: No Erythema: No Erythema: No Erythema: No Hemosiderin Staining: No Hemosiderin Staining: No Hemosiderin Staining: No Mottled: No Mottled: No Mottled: No Pallor: No Pallor: No Pallor: No Rubor: No Rubor: No Rubor: No Temperature: No Abnormality No Abnormality No Abnormality Tenderness on Yes Yes Yes Palpation: Wound Preparation: Ulcer Cleansing: Other: Ulcer Cleansing: Other: Ulcer Cleansing: Other: soap and water soap and water soap and water Topical  Anesthetic Topical Anesthetic Topical Anesthetic Applied: Other: lidocaine Applied: Other: lidocaine Applied: Other: lidocaine 4% 4% 4% Wound Number: 9 N/A N/A Photos: No Photos N/A N/A Wound Location: Left, Posterior Lower Leg N/A N/A Wounding Event: Gradually Appeared N/A N/A Primary Etiology: Venous Leg Ulcer N/A N/A Comorbid History: N/A N/A N/A Date Acquired: 03/13/2015 N/A N/A Weeks of Treatment: 14 N/A N/A Wound Status: Healed - Epithelialized N/A N/A Measurements L x W x D 0x0x0 N/A N/A (cm) Rohrer, Dashana H. ([id number]) Area (cm) : 0 N/A N/A Volume (cm) : 0 N/A N/A % Reduction in Area: 100.00% N/A N/A % Reduction in Volume: 100.00% N/A N/A Classification: Full Thickness Without N/A N/A Exposed Support Structures HBO Classification: N/A N/A N/A Exudate Amount: N/A N/A N/A Exudate Type: N/A N/A N/A Exudate Color: N/A N/A N/A Wound Margin: N/A N/A N/A Granulation Amount: N/A N/A N/A Granulation Quality: N/A N/A N/A Necrotic Amount: N/A N/A N/A Necrotic Tissue: N/A N/A N/A Exposed Structures: N/A N/A N/A Epithelialization: N/A N/A N/A Periwound Skin Texture: No Abnormalities Noted N/A N/A Periwound Skin No Abnormalities Noted N/A N/A Moisture: Periwound Skin Color: No Abnormalities Noted N/A N/A Temperature: N/A N/A N/A Tenderness on No N/A N/A Palpation: Wound Preparation: N/A N/A N/A  Treatment Notes Electronic Signature(s) Signed: 08/02/2015 5:35:56 PM By: Alejandro Mulling Entered By: Alejandro Mulling on 08/02/2015 08:41:48 Calk, [name] ([id number]) -------------------------------------------------------------------------------- Multi-Disciplinary Care Plan Details Patient Name: [name], [name] Date of Service: 08/02/2015 8:00 AM Medical Record Patient Account Number: [id number] Number: Treating RN: Phillis Haggis [birth year] (59 y.o. Other Clinician: Date of Birth/Sex: Female) Treating BURNS III, Primary Care Physician: Fidel Levy Physician/Extender: Zollie Beckers Referring Physician: Jones Broom in Treatment: 66 Active Inactive Orientation to the Wound Care Program Nursing Diagnoses: Knowledge deficit related to the wound healing center program Goals: Patient/caregiver will verbalize understanding of the Wound Healing Center Program Date Initiated: 01/30/2015 Goal Status: Active Interventions: Provide education on orientation to the wound center Notes: Pain, Acute or Chronic Nursing Diagnoses: Pain, acute or chronic: actual or potential Goals: Patient will verbalize adequate pain control and receive pain control interventions during procedures as needed Date Initiated: 01/30/2015 Goal Status: Active Patient/caregiver will verbalize adequate pain control between visits Date Initiated: 01/30/2015 Goal Status: Active Interventions: Assess comfort goal upon admission Encourage patient to take pain medications as prescribed Notes: Venous Leg Ulcer Mccullers, Loreal H. ([id number]) Nursing Diagnoses: Potential for venous Insuffiency (use before diagnosis confirmed) Goals: Non-invasive venous studies are completed as ordered Date Initiated: 01/30/2015 Goal Status: Active Interventions: Assess peripheral edema status every visit. Notes: Wound/Skin Impairment Nursing Diagnoses: Impaired tissue integrity Goals: Ulcer/skin breakdown will have a volume reduction of 30% by week 4 Date Initiated: 01/30/2015 Goal Status: Active Ulcer/skin breakdown will have a volume reduction of 50% by week 8 Date Initiated: 01/30/2015 Goal Status: Active Ulcer/skin breakdown will have a volume reduction of 80% by week 12 Date Initiated: 01/30/2015 Goal Status: Active Ulcer/skin breakdown will [name] within 14 weeks Date Initiated: 01/30/2015 Goal Status: Active Interventions: Assess ulceration(s) every visit Notes: Electronic Signature(s) Signed: 08/02/2015 5:35:56 PM By: Alejandro Mulling Entered By:  Alejandro Mulling on 08/02/2015 08:41:41 Uram, Myishia H. ([id number]) -------------------------------------------------------------------------------- Pain Assessment Details Patient Name: [name], [name] Date of Service: 08/02/2015 8:00 AM Medical Record Patient Account Number: [id number] Number: Treating RN: Phillis Haggis 10/04/1955 (59 y.o. Other Clinician: Date of Birth/Sex: Female) Treating BURNS III, Primary Care Physician: Fidel Levy Physician/Extender: Zollie Beckers Referring Physician: Jones Broom in Treatment: 26 Active Problems Location of Pain Severity and Description of Pain Patient Has Paino No Site Locations Pain Management and Medication Current Pain Management: Electronic Signature(s) Signed: 08/02/2015 5:35:56 PM By: Alejandro Mulling Entered By: Alejandro Mulling on 08/02/2015 08:09:34 Savant, Viktoriya Rexene [name] ([id number]) -------------------------------------------------------------------------------- Patient/Caregiver Education Details Patient Name: Naas, Shellyann H. Date of Service: 08/02/2015 8:00 AM Medical Record Patient Account Number: [id number] Number: Treating RN: Phillis Haggis Aug 27, 1955 (59 y.o. Other Clinician: Date of Birth/Gender: Female) Treating BURNS III, Primary Care Physician: Fidel Levy Physician/Extender: Zollie Beckers Referring Physician: Jones Broom in Treatment: 49 Education Assessment Education Provided To: Patient Education Topics Provided Wound/Skin Impairment: Handouts: Other: do not get wraps wet Methods: Demonstration, Explain/Verbal Responses: State content correctly Electronic Signature(s) Signed: 08/02/2015 5:35:56 PM By: Alejandro Mulling Entered By: Alejandro Mulling on 08/02/2015 08:51:58 Milazzo, Messiah H. ([id number]) -------------------------------------------------------------------------------- Wound Assessment Details Patient Name: [name], [name] Date of  Service: 08/02/2015 8:00 AM Medical Record Patient Account Number: [id number] Number: Treating RN: Phillis Haggis 06-23-1956 (59 y.o. Other Clinician: Date of Birth/Sex: Female) Treating BURNS III, Primary Care Physician: Fidel Levy Physician/Extender: Zollie Beckers Referring Physician: Jones Broom in Treatment: 26 Wound Status Wound Number: 1 Primary Etiology: Venous Leg Ulcer Wound Location:  Right Lower Leg - Anterior Wound Status: Open Wounding Event: Gradually Appeared Comorbid History: Hypertension, Type II Diabetes Date Acquired: 01/09/2015 Weeks Of Treatment: 26 Clustered Wound: No Photos Photo Uploaded By: Alejandro Mulling on 08/02/2015 17:21:58 Wound Measurements Length: (cm) 20 Width: (cm) 21 Depth: (cm) 0.3 Area: (cm) 329.867 Volume: (cm) 98.96 % Reduction in Area: -701744.7% % Reduction in Volume: -1979100% Epithelialization: None Tunneling: No Undermining: No Wound Description Full Thickness Without Classification: Exposed Support Structures Diabetic Severity Grade 2 (Wagner): Wound Margin: Thickened Exudate Amount: Large Exudate Type: Serous Exudate Color: amber Foul Odor After Cleansing: No Wound Bed Herms, Teresina H. ([id number]) Granulation Amount: Medium (34-66%) Exposed Structure Granulation Quality: Pink Fascia Exposed: No Necrotic Amount: Medium (34-66%) Fat Layer Exposed: No Necrotic Quality: Eschar, Adherent Slough Tendon Exposed: No Muscle Exposed: No Joint Exposed: No Bone Exposed: No Limited to Skin Breakdown Periwound Skin Texture Texture Color No Abnormalities Noted: No No Abnormalities Noted: No Callus: No Atrophie Blanche: No Crepitus: No Cyanosis: No Excoriation: No Ecchymosis: No Fluctuance: No Erythema: No Friable: No Hemosiderin Staining: No Induration: No Mottled: No Localized Edema: No Pallor: No Rash: No Rubor: No Scarring: Yes Temperature / Pain Moisture Temperature: No  Abnormality No Abnormalities Noted: No Tenderness on Palpation: Yes Dry / Scaly: No Maceration: No Moist: Yes Wound Preparation Ulcer Cleansing: Other: soap and water, Topical Anesthetic Applied: Other: lidocaine 4%, Treatment Notes Wound #1 (Right, Anterior Lower Leg) 1. Cleansed with: Cleanse wound with antibacterial soap and water 2. Anesthetic Topical Lidocaine 4% cream to wound bed prior to debridement 4. Dressing Applied: Aquacel Ag 5. Secondary Dressing Applied ABD Pad 7. Secured with 3 Layer Compression System - Bilateral Electronic Signature(s) Signed: 08/02/2015 5:35:56 PM By: Almedia Balls, Aminat Rexene [name] ([id number]) Entered By: Alejandro Mulling on 08/02/2015 08:30:36 Habib, [name] ([id number]) -------------------------------------------------------------------------------- Wound Assessment Details Patient Name: [name], [name] Date of Service: 08/02/2015 8:00 AM Medical Record Patient Account Number: [id number] Number: Treating RN: Phillis Haggis 1955-09-02 (59 y.o. Other Clinician: Date of Birth/Sex: Female) Treating BURNS III, Primary Care Physician: Fidel Levy Physician/Extender: Zollie Beckers Referring Physician: Jones Broom in Treatment: 26 Wound Status Wound Number: 3 Primary Etiology: Venous Leg Ulcer Wound Location: Left Lower Leg - Lateral Wound Status: Open Wounding Event: Gradually Appeared Comorbid History: Hypertension, Type II Diabetes Date Acquired: 01/09/2015 Weeks Of Treatment: 26 Clustered Wound: No Photos Photo Uploaded By: Alejandro Mulling on 08/02/2015 17:22:29 Wound Measurements Length: (cm) 13.5 Width: (cm) 7.5 Depth: (cm) 0.4 Area: (cm) 79.522 Volume: (cm) 31.809 % Reduction in Area: -623.2% % Reduction in Volume: -2791.7% Epithelialization: None Tunneling: No Undermining: No Wound Description Full Thickness Without Classification: Exposed Support Structures Diabetic  Severity Grade 1 (Wagner): Wound Margin: Flat and Intact Exudate Amount: Large Exudate Type: Serous Exudate Color: amber Foul Odor After Cleansing: No Wound Bed Bures, Kristol H. ([id number]) Granulation Amount: Medium (34-66%) Exposed Structure Granulation Quality: Red Fascia Exposed: No Necrotic Amount: Medium (34-66%) Fat Layer Exposed: No Necrotic Quality: Adherent Slough Tendon Exposed: No Muscle Exposed: No Joint Exposed: No Bone Exposed: No Limited to Skin Breakdown Periwound Skin Texture Texture Color No Abnormalities Noted: No No Abnormalities Noted: No Callus: No Atrophie Blanche: No Crepitus: No Cyanosis: No Excoriation: No Ecchymosis: No Fluctuance: No Erythema: No Friable: No Hemosiderin Staining: No Induration: No Mottled: No Localized Edema: No Pallor: No Rash: No Rubor: No Scarring: Yes Temperature / Pain Moisture Temperature: No Abnormality No Abnormalities Noted: No Tenderness on Palpation: Yes Dry / Scaly: No Maceration: No Moist:  Yes Wound Preparation Ulcer Cleansing: Other: soap and water, Topical Anesthetic Applied: Other: lidocaine 4%, Treatment Notes Wound #3 (Left, Lateral Lower Leg) 1. Cleansed with: Cleanse wound with antibacterial soap and water 2. Anesthetic Topical Lidocaine 4% cream to wound bed prior to debridement 4. Dressing Applied: Aquacel Ag 5. Secondary Dressing Applied ABD Pad 7. Secured with 3 Layer Compression System - Bilateral Electronic Signature(s) Signed: 08/02/2015 5:35:56 PM By: Almedia Balls, Candas Rexene [name] ([id number]) Entered By: Alejandro Mulling on 08/02/2015 08:36:36 [name], [name] ([id number]) -------------------------------------------------------------------------------- Wound Assessment Details Patient Name: [name], [name] Date of Service: 08/02/2015 8:00 AM Medical Record Patient Account Number: [id number] Number: Treating RN: Phillis Haggis 05-27-1956 (59  y.o. Other Clinician: Date of Birth/Sex: Female) Treating BURNS III, Primary Care Physician: Fidel Levy Physician/Extender: Zollie Beckers Referring Physician: Jones Broom in Treatment: 26 Wound Status Wound Number: 8 Primary Etiology: Venous Leg Ulcer Wound Location: Right Lower Leg - Posterior Wound Status: Open Wounding Event: Gradually Appeared Comorbid History: Hypertension, Type II Diabetes Date Acquired: 03/27/2015 Weeks Of Treatment: 17 Clustered Wound: No Photos Photo Uploaded By: Alejandro Mulling on 08/02/2015 17:22:29 Wound Measurements Length: (cm) 7.4 Width: (cm) 6 Depth: (cm) 0.3 Area: (cm) 34.872 Volume: (cm) 10.462 % Reduction in Area: -1873.5% % Reduction in Volume: -5810.7% Epithelialization: None Tunneling: No Undermining: No Wound Description Full Thickness Without Classification: Exposed Support Structures Diabetic Severity Grade 1 (Wagner): Wound Margin: Flat and Intact Exudate Amount: Large Exudate Type: Serous Exudate Color: amber Foul Odor After Cleansing: No Wound Bed [name], [name] ([id number]) Granulation Amount: Medium (34-66%) Exposed Structure Granulation Quality: Red, Hyper-granulation Fascia Exposed: No Necrotic Amount: Medium (34-66%) Fat Layer Exposed: No Necrotic Quality: Eschar, Adherent Slough Tendon Exposed: No Muscle Exposed: No Joint Exposed: No Bone Exposed: No Limited to Skin Breakdown Periwound Skin Texture Texture Color No Abnormalities Noted: No No Abnormalities Noted: No Callus: No Atrophie Blanche: No Crepitus: No Cyanosis: No Excoriation: No Ecchymosis: No Fluctuance: No Erythema: No Friable: No Hemosiderin Staining: No Induration: No Mottled: No Localized Edema: Yes Pallor: No Rash: No Rubor: No Scarring: No Temperature / Pain Moisture Temperature: No Abnormality No Abnormalities Noted: No Tenderness on Palpation: Yes Dry / Scaly: No Maceration: No Moist: Yes Wound  Preparation Ulcer Cleansing: Other: soap and water, Topical Anesthetic Applied: Other: lidocaine 4%, Treatment Notes Wound #8 (Right, Posterior Lower Leg) 1. Cleansed with: Cleanse wound with antibacterial soap and water 2. Anesthetic Topical Lidocaine 4% cream to wound bed prior to debridement 4. Dressing Applied: Aquacel Ag 5. Secondary Dressing Applied ABD Pad 7. Secured with 3 Layer Compression System - Bilateral Electronic Signature(s) Signed: 08/02/2015 5:35:56 PM By: Almedia Balls, Lanice Rexene [name] ([id number]) Entered By: Alejandro Mulling on 08/02/2015 08:32:55 [name], [name] ([id number]) -------------------------------------------------------------------------------- Wound Assessment Details Patient Name: [name], [name] Date of Service: 08/02/2015 8:00 AM Medical Record Patient Account Number: [id number] Number: Treating RN: Phillis Haggis [birth year] (59 y.o. Other Clinician: Date of Birth/Sex: Female) Treating BURNS III, Primary Care Physician: Fidel Levy Physician/Extender: Zollie Beckers Referring Physician: Jones Broom in Treatment: 26 Wound Status Wound Number: 9 Primary Etiology: Venous Leg Ulcer Wound Location: Left, Posterior Lower Leg Wound Status: Healed - Epithelialized Wounding Event: Gradually Appeared Date Acquired: 03/13/2015 Weeks Of Treatment: 14 Clustered Wound: No Photos Photo Uploaded By: Alejandro Mulling on 08/02/2015 17:23:00 Wound Measurements Length: (cm) 0 % Reducti Width: (cm) 0 % Reducti Depth: (cm) 0 Area: (cm) 0 Volume: (cm) 0 on in Area: 100% on in Volume: 100%  Wound Description Full Thickness Without Exposed Classification: Support Structures Periwound Skin Texture Texture Color No Abnormalities Noted: No No Abnormalities Noted: No Moisture No Abnormalities Noted: No Tritschler, Mahati H. ([id number]) Electronic Signature(s) Signed: 08/02/2015 5:35:56 PM By: Alejandro Mulling Entered By: Alejandro Mulling on 08/02/2015 08:34:34 Heart, Aniyla H. ([id number]) -------------------------------------------------------------------------------- Vitals Details Patient Name: [name], Konstance H. Date of Service: 08/02/2015 8:00 AM Medical Record Patient Account Number: [id number] Number: Treating RN: Phillis Haggis 1956/04/22 (59 y.o. Other Clinician: Date of Birth/Sex: Female) Treating BURNS III, Primary Care Physician: Fidel Levy Physician/Extender: Zollie Beckers Referring Physician: Jones Broom in Treatment: 26 Vital Signs Time Taken: 08:09 Temperature (F): 97.7 Height (in): 68 Pulse (bpm): 89 Weight (lbs): 294 Respiratory Rate (breaths/min): 20 Body Mass Index (BMI): 44.7 Blood Pressure (mmHg): 125/62 Reference Range: 80 - 120 mg / dl Electronic Signature(s) Signed: 08/02/2015 5:35:56 PM By: Alejandro Mulling Entered By: Alejandro Mulling on 08/02/2015 08:12:03

## 2015-08-03 NOTE — Progress Notes (Signed)
[name], [name] ([id number]) Visit Report for 08/02/2015 Chief Complaint Document Details Patient Name: [name] [name]. Date of Service: 08/02/2015 8:00 AM Medical Record Patient Account Number: [id number] Number: Treating RN: Phillis Haggis 06-25-1956 (60 y.o. Other Clinician: Date of Birth/Sex: Female) Treating BURNS III, Primary Care Physician/Extender: Karn Cassis Physician: Referring Physician: Jones Broom in Treatment: 26 Information Obtained from: Patient Chief Complaint Chronic bilateral calf ulcers. Electronic Signature(s) Signed: 08/02/2015 11:51:43 AM By: Madelaine Bhat MD Entered By: Madelaine Bhat on 08/02/2015 08:58:17 [name] ([id number]) -------------------------------------------------------------------------------- Debridement Details Patient Name: [name]. Date of Service: 08/02/2015 8:00 AM Medical Record Patient Account Number: [id number] Number: Treating RN: Phillis Haggis [birth year] (60 y.o. Other Clinician: Date of Birth/Sex: Female) Treating BURNS III, Primary Care Physician/Extender: Karn Cassis Physician: Referring Physician: Jones Broom in Treatment: 26 Debridement Performed for Wound #1 Right,Anterior Lower Leg Assessment: Performed By: Physician BURNS III, Melanie Crazier., MD Debridement: Debridement Pre-procedure Yes Verification/Time Out Taken: Start Time: 08:46 Pain Control: Other : lidocaine 4% cream Level: Skin/Subcutaneous Tissue Total Area Debrided (L x 4 (cm) x 5 (cm) = 20 (cm) W): Tissue and other Viable, Non-Viable, Exudate, Fat, Fibrin/Slough, Subcutaneous material debrided: Instrument: Curette Bleeding: Minimum Hemostasis Achieved: Pressure End Time: 08:56 Procedural Pain: 0 Post Procedural Pain: 0 Response to Treatment: Procedure was tolerated well Post Debridement Measurements of Total Wound Length: (cm)  20 Width: (cm) 21 Depth: (cm) 0.4 Volume: (cm) 131.947 Post Procedure Diagnosis Same as Pre-procedure Electronic Signature(s) Signed: 08/02/2015 11:51:43 AM By: Madelaine Bhat MD Signed: 08/02/2015 5:35:56 PM By: Alejandro Mulling Entered By: Madelaine Bhat on 08/02/2015 08:57:10 [name]. ([id number]) [name]. ([id number]) -------------------------------------------------------------------------------- Debridement Details Patient Name: [name]. Date of Service: 08/02/2015 8:00 AM Medical Record Patient Account Number: [id number] Number: Treating RN: Phillis Haggis September 23, 1955 (60 y.o. Other Clinician: Date of Birth/Sex: Female) Treating BURNS III, Primary Care Physician/Extender: Karn Cassis Physician: Referring Physician: Jones Broom in Treatment: 26 Debridement Performed for Wound #3 Left,Lateral Lower Leg Assessment: Performed By: Physician BURNS III, Melanie Crazier., MD Debridement: Debridement Pre-procedure Yes Verification/Time Out Taken: Start Time: 08:46 Pain Control: Other : lidocaine 4% cream Level: Skin/Subcutaneous Tissue Total Area Debrided (L x 13.5 (cm) x 7.5 (cm) = 101.25 (cm) W): Tissue and other Viable, Non-Viable, Exudate, Fat, Fibrin/Slough, Subcutaneous material debrided: Instrument: Curette Bleeding: Minimum Hemostasis Achieved: Pressure End Time: 08:56 Procedural Pain: 0 Post Procedural Pain: 0 Response to Treatment: Procedure was tolerated well Post Debridement Measurements of Total Wound Length: (cm) 13.5 Width: (cm) 7.5 Depth: (cm) 0.5 Volume: (cm) 39.761 Post Procedure Diagnosis Same as Pre-procedure Electronic Signature(s) Signed: 08/02/2015 11:51:43 AM By: Madelaine Bhat MD Signed: 08/02/2015 5:35:56 PM By: Alejandro Mulling Entered By: Madelaine Bhat on 08/02/2015 08:57:37 [name]. ([id number]) [name].  ([id number]) -------------------------------------------------------------------------------- Debridement Details Patient Name: [name]. Date of Service: 08/02/2015 8:00 AM Medical Record Patient Account Number: [id number] Number: Treating RN: Phillis Haggis 1955-11-29 (60 y.o. Other Clinician: Date of Birth/Sex: Female) Treating BURNS III, Primary Care Physician/Extender: Karn Cassis Physician: Referring Physician: Jones Broom in Treatment: 26 Debridement Performed for Wound #8 Right,Posterior Lower Leg Assessment: Performed By: Physician BURNS III, Melanie Crazier., MD Debridement: Debridement Pre-procedure Yes Verification/Time Out Taken: Start Time: 08:46 Pain Control: Other : lidocaine 4% cream Level: Skin/Subcutaneous Tissue Total Area Debrided (L x 7.4 (cm) x 6 (cm) =  44.4 (cm) W): Tissue and other Viable, Non-Viable, Exudate, Fat, Fibrin/Slough, Subcutaneous material debrided: Instrument: Curette Bleeding: Minimum Hemostasis Achieved: Pressure End Time: 08:56 Procedural Pain: 0 Post Procedural Pain: 0 Response to Treatment: Procedure was tolerated well Post Debridement Measurements of Total Wound Length: (cm) 7.4 Width: (cm) 6 Depth: (cm) 0.4 Volume: (cm) 13.949 Post Procedure Diagnosis Same as Pre-procedure Electronic Signature(s) Signed: 08/02/2015 11:51:43 AM By: Madelaine Bhat MD Signed: 08/02/2015 5:35:56 PM By: Alejandro Mulling Entered By: Madelaine Bhat on 08/02/2015 08:57:53 [name]. ([id number]) [name]. ([id number]) -------------------------------------------------------------------------------- HPI Details Patient Name: [name]. Date of Service: 08/02/2015 8:00 AM Medical Record Patient Account Number: [id number] Number: Treating RN: Phillis Haggis 15-Mar-1956 (60 y.o. Other Clinician: Date of Birth/Sex: Female) Treating BURNS III, Primary Care  Physician/Extender: Karn Cassis Physician: Referring Physician: Jones Broom in Treatment: 26 History of Present Illness HPI Description: Pleasant 60 year old with [name]/o DM (Hgb A1c 7.4 in Aug 2016), ESRD (on hemodialysis since July 2016). Presented to her PCP, Dr. Venora Maples, with BLE ulcers since June 2016. 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 Left lower extremity ultrasound in May 2016 showed no evidence for DVT. No assessment for venous insufficiency. Culture 04/19/2015 grew methicillin sensitive staph aureus (sensitive to tetracycline), Stenotrophomonas maltophilia, and Enterococcus faecalis. Completed course of doxycycline. Hospitalized in Nov 2016 for worsening cellulitis. Treated with IV antibiotics. No operative debridement or biopsy. Punch biopsy of left calf ulcer 06/28/2015 showed no evidence for malignancy. Bilateral lower extremity venous ultrasound 07/20/2015 showed no evidence for DVT or chronic venous insufficiency. Performing dressing changes with silver alginate. Tolerating Profore light bilaterally 3x/week. She returns to clinic and is without complaints. No significant pain. No fever or chills. Less drainage. Electronic Signature(s) Signed: 08/02/2015 11:51:43 AM By: Madelaine Bhat MD Entered By: Madelaine Bhat on 08/02/2015 09:16:40 Tabak, Aaron [name] ([id number]) -------------------------------------------------------------------------------- Physical Exam Details Patient Name: [name]. Date of Service: 08/02/2015 8:00 AM Medical Record Patient Account Number: [id number] Number: Treating RN: Phillis Haggis 1956/06/28 (59 y.o. Other Clinician: Date of Birth/Sex: Female) Treating BURNS III, Primary Care Physician/Extender: Karn Cassis Physician: Referring Physician: Fidel Levy Weeks in Treatment: 26 Constitutional . Pulse regular. Respirations normal and unlabored. Afebrile. . Notes Bilateral calf ulcerations improved with compression. No significant cellulitis. Less necrotic tissue. Increased granulation tissue. 2+ pitting edema, improved. Faintly palpable DP bilaterally. Triphasic on the right. Biphasic on the left. Noncompressible. Electronic Signature(s) Signed: 08/02/2015 11:51:43 AM By: Madelaine Bhat MD Entered By: Madelaine Bhat on 08/02/2015 09:17:32 Branden, Aaron [name] ([id number]) -------------------------------------------------------------------------------- Physician Orders Details Patient Name: Boyajian, Keyasia [name]. Date of Service: 08/02/2015 8:00 AM Medical Record Patient Account Number: [id number] Number: Treating RN: Phillis Haggis 20-Mar-1956 (59 y.o. Other Clinician: Date of Birth/Sex: Female) Treating BURNS III, Primary Care Physician/Extender: Karn Cassis Physician: Referring Physician: Jones Broom in Treatment: 47 Verbal / Phone Orders: Yes Clinician: Ashok Cordia, Debi Read Back and Verified: Yes Diagnosis Coding Wound Cleansing Wound #1 Right,Anterior Lower Leg o Clean wound with Normal Saline. o Cleanse wound with mild soap and water o May Shower, gently pat wound dry prior to applying new dressing. Wound #3 Left,Lateral Lower Leg o Clean wound with Normal Saline. o Cleanse wound with mild soap and water o May Shower, gently pat wound dry prior to applying new dressing. Wound #8  Right,Posterior Lower Leg o Clean wound with Normal Saline. o Cleanse wound with mild soap and water o May Shower, gently pat wound dry prior to applying new dressing. Anesthetic Wound #1 Right,Anterior Lower Leg o Topical Lidocaine 4% cream applied to wound bed prior to debridement Wound #3 Left,Lateral Lower Leg o Topical Lidocaine 4% cream applied to wound bed prior to  debridement Wound #8 Right,Posterior Lower Leg o Topical Lidocaine 4% cream applied to wound bed prior to debridement Skin Barriers/Peri-Wound Care Wound #1 Right,Anterior Lower Leg o Barrier cream Wound #3 Left,Lateral Lower Leg o Barrier cream Wound #8 Right,Posterior Lower Leg Aranas, [name]. ([id number]) o Barrier cream Primary Wound Dressing Wound #1 Right,Anterior Lower Leg o Aquacel Ag - or equivalent (No foam) Wound #3 Left,Lateral Lower Leg o Aquacel Ag - or equivalent (No foam) Wound #8 Right,Posterior Lower Leg o Aquacel Ag - or equivalent (No foam) Secondary Dressing Wound #1 Right,Anterior Lower Leg o ABD pad - please add ABD pad to dressing Wound #3 Left,Lateral Lower Leg o ABD pad - please add ABD pad to dressing Wound #8 Right,Posterior Lower Leg o ABD pad - please add ABD pad to dressing Dressing Change Frequency Wound #1 Right,Anterior Lower Leg o Change Dressing Monday, Wednesday, Friday - Wednesday in wound care center Wound #3 Left,Lateral Lower Leg o Change Dressing Monday, Wednesday, Friday - Wednesday in wound care center Wound #8 Right,Posterior Lower Leg o Change Dressing Monday, Wednesday, Friday - Wednesday in wound care center Follow-up Appointments Wound #1 Right,Anterior Lower Leg o Return Appointment in 1 week. Wound #3 Left,Lateral Lower Leg o Return Appointment in 1 week. Wound #8 Right,Posterior Lower Leg o Return Appointment in 1 week. Edema Control Wound #1 Right,Anterior Lower Leg o 3 Layer Compression System - Bilateral - Profore lite o Elevate legs to the level of the heart and pump ankles as often as possible - Elevate legs at Dialysis Falter, Malaika [name]. ([id number]) Wound #3 Left,Lateral Lower Leg o 3 Layer Compression System - Bilateral - Profore lite o Elevate legs to the level of the heart and pump ankles as often as possible - Elevate legs at Dialysis Wound #8 Right,Posterior  Lower Leg o 3 Layer Compression System - Bilateral - Profore lite o Elevate legs to the level of the heart and pump ankles as often as possible - Elevate legs at Dialysis Off-Loading Wound #1 Right,Anterior Lower Leg o Turn and reposition every 2 hours Wound #3 Left,Lateral Lower Leg o Turn and reposition every 2 hours Wound #8 Right,Posterior Lower Leg o Turn and reposition every 2 hours Additional Orders / Instructions Wound #1 Right,Anterior Lower Leg o Activity as tolerated Wound #3 Left,Lateral Lower Leg o Activity as tolerated Wound #8 Right,Posterior Lower Leg o Activity as tolerated Home Health Wound #1 Right,Anterior Lower Leg o Continue Home Health Visits - Advanced: Monday, Wednesday and Friday **WRAP GOES FROM BASE OF TOES TO THREE FINGER WIDTHS BENEATH BEND IN KNEE. o Home Health Nurse may visit PRN to address patientos wound care needs. o FACE TO FACE ENCOUNTER: MEDICARE and MEDICAID PATIENTS: I certify that this patient is under my care and that I had a face-to-face encounter that meets the physician face-to-face encounter requirements with this patient on this date. The encounter with the patient was in whole or in part for the following MEDICAL CONDITION: (primary reason for Home Healthcare) MEDICAL NECESSITY: I certify, that based on my findings, NURSING services are a medically necessary home health service. HOME BOUND STATUS: I  certify that my clinical findings support that this patient is homebound (i.e., Due to illness or injury, pt requires aid of supportive devices such as crutches, cane, wheelchairs, walkers, the use of special transportation or the assistance of another person to leave their place of residence. There is a normal inability to leave the home and doing so requires considerable and taxing effort. Other absences are for medical reasons / religious services and are infrequent or of short duration when for other  reasons). Nauta, Mady [name]. ([id number]) o If current dressing causes regression in wound condition, may D/C ordered dressing product/s and apply Normal Saline Moist Dressing daily until next Wound Healing Center / Other MD appointment. Notify Wound Healing Center of regression in wound condition at [phone]. o Please direct any NON-WOUND related issues/requests for orders to patient's Primary Care Physician Wound #3 Left,Lateral Lower Leg o Continue Home Health Visits - Advanced: Monday, Wednesday and Friday **WRAP GOES FROM BASE OF TOES TO THREE FINGER WIDTHS BENEATH BEND IN KNEE. o Home Health Nurse may visit PRN to address patientos wound care needs. o FACE TO FACE ENCOUNTER: MEDICARE and MEDICAID PATIENTS: I certify that this patient is under my care and that I had a face-to-face encounter that meets the physician face-to-face encounter requirements with this patient on this date. The encounter with the patient was in whole or in part for the following MEDICAL CONDITION: (primary reason for Home Healthcare) MEDICAL NECESSITY: I certify, that based on my findings, NURSING services are a medically necessary home health service. HOME BOUND STATUS: I certify that my clinical findings support that this patient is homebound (i.e., Due to illness or injury, pt requires aid of supportive devices such as crutches, cane, wheelchairs, walkers, the use of special transportation or the assistance of another person to leave their place of residence. There is a normal inability to leave the home and doing so requires considerable and taxing effort. Other absences are for medical reasons / religious services and are infrequent or of short duration when for other reasons). o If current dressing causes regression in wound condition, may D/C ordered dressing product/s and apply Normal Saline Moist Dressing daily until next Wound Healing Center / Other MD appointment. Notify Wound Healing  Center of regression in wound condition at [phone]. o Please direct any NON-WOUND related issues/requests for orders to patient's Primary Care Physician Wound #8 Right,Posterior Lower Leg o Continue Home Health Visits - Advanced: Monday, Wednesday and Friday **WRAP GOES FROM BASE OF TOES TO THREE FINGER WIDTHS BENEATH BEND IN KNEE. o Home Health Nurse may visit PRN to address patientos wound care needs. o FACE TO FACE ENCOUNTER: MEDICARE and MEDICAID PATIENTS: I certify that this patient is under my care and that I had a face-to-face encounter that meets the physician face-to-face encounter requirements with this patient on this date. The encounter with the patient was in whole or in part for the following MEDICAL CONDITION: (primary reason for Home Healthcare) MEDICAL NECESSITY: I certify, that based on my findings, NURSING services are a medically necessary home health service. HOME BOUND STATUS: I certify that my clinical findings support that this patient is homebound (i.e., Due to illness or injury, pt requires aid of supportive devices such as crutches, cane, wheelchairs, walkers, the use of special transportation or the assistance of another person to leave their place of residence. There is a normal inability to leave the home and doing so requires considerable and taxing effort. Other absences are for medical reasons /  religious services and are infrequent or of short duration when for other reasons). o If current dressing causes regression in wound condition, may D/C ordered dressing product/s and apply Normal Saline Moist Dressing daily until next Wound Healing Center / Other MD appointment. Notify Wound Healing Center of regression in wound condition at [phone]. o Please direct any NON-WOUND related issues/requests for orders to patient's Primary Care Physician BUFFY, EHLER ([id number]) Electronic Signature(s) Signed: 08/02/2015 11:51:43 AM By: Madelaine Bhat MD Signed: 08/02/2015 5:35:56 PM By: Alejandro Mulling Entered By: Alejandro Mulling on 08/02/2015 08:50:07 Pavlovich, Aaron [name] ([id number]) -------------------------------------------------------------------------------- Problem List Details Patient Name: [name]. Date of Service: 08/02/2015 8:00 AM Medical Record Patient Account Number: [id number] Number: Treating RN: Phillis Haggis 1956/04/28 (59 y.o. 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 Date of Service: 08/02/2015 8:00 AM Medical Record Patient Account Number: [id number] Number: Treating RN: Phillis Haggis 04/03/1956 (59 y.o. Other Clinician: Date of Birth/Sex: Female) Treating BURNS III, Primary Care Physician/Extender: Karn Cassis Physician: Referring Physician: Jones Broom in Treatment: 26 Subjective Chief Complaint Information obtained from Patient Chronic bilateral calf ulcers. History of Present Illness  (HPI) Pleasant 60 year old with [name]/o DM (Hgb A1c 7.4 in Aug 2016), ESRD (on hemodialysis since July 2016). Presented to her PCP, Dr. Venora Maples, with BLE ulcers since June 2016. 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 Left lower extremity ultrasound in May 2016 showed no evidence for DVT. No assessment for venous insufficiency. Culture 04/19/2015 grew methicillin sensitive staph aureus (sensitive to tetracycline), Stenotrophomonas maltophilia, and Enterococcus faecalis. Completed course of doxycycline. Hospitalized in Nov 2016 for worsening cellulitis. Treated with IV antibiotics. No operative debridement or biopsy. Punch biopsy of left calf ulcer 06/28/2015 showed no evidence for malignancy. Bilateral lower extremity venous ultrasound 07/20/2015 showed no evidence for DVT or chronic venous insufficiency. Performing dressing changes with silver alginate. Tolerating Profore light bilaterally 3x/week. She returns to clinic and is without complaints. No significant pain. No fever or chills. Less drainage. Objective Constitutional Pulse regular. Respirations normal and unlabored. Afebrile. Vitals Time Taken: 8:09 AM, Height: 68 in, Weight: 294 lbs, BMI: 44.7, Temperature: 97.7 F, Pulse: 89 bpm, Respiratory Rate: 20 breaths/min, Blood Pressure: 125/62 mmHg. Krygier, Jurnee HMarland Kitchen ([id number]) General Notes: Bilateral calf ulcerations improved with compression. No significant cellulitis. Less necrotic tissue. Increased granulation tissue. 2+ pitting edema, improved. Faintly palpable DP bilaterally. Triphasic on the right. Biphasic on the left. Noncompressible. Integumentary (Hair, Skin) Wound #1 status is Open. Original cause of wound was Gradually Appeared. The wound is located on the Right,Anterior Lower Leg. The wound measures 20cm length x 21cm width x 0.3cm depth;  329.867cm^2 area and 98.96cm^3 volume. The wound is  limited to skin breakdown. There is no tunneling or undermining noted. There is a large amount of serous drainage noted. The wound margin is thickened. There is medium (34-66%) pink granulation within the wound bed. There is a medium (34-66%) amount of necrotic tissue within the wound bed including Eschar and Adherent Slough. The periwound skin appearance exhibited: Scarring, Moist. The periwound skin appearance did not exhibit: Callus, Crepitus, Excoriation, Fluctuance, Friable, Induration, Localized Edema, Rash, Dry/Scaly, Maceration, Atrophie Blanche, Cyanosis, Ecchymosis, Hemosiderin Staining, Mottled, Pallor, Rubor, Erythema. Periwound temperature was noted as No Abnormality. The periwound has tenderness on palpation. Wound #3 status is Open. Original cause of wound was Gradually Appeared. The wound is located on the Left,Lateral Lower Leg. The wound measures 13.5cm length x 7.5cm width x 0.4cm depth; 79.522cm^2 area and 31.809cm^3 volume. The wound is limited to skin breakdown. There is no tunneling or undermining noted. There is a large amount of serous drainage noted. The wound margin is flat and intact. There is medium (34-66%) red granulation within the wound bed. There is a medium (34-66%) amount of necrotic tissue within the wound bed including Adherent Slough. The periwound skin appearance exhibited: Scarring, Moist. The periwound skin appearance did not exhibit: Callus, Crepitus, Excoriation, Fluctuance, Friable, Induration, Localized Edema, Rash, Dry/Scaly, Maceration, Atrophie Blanche, Cyanosis, Ecchymosis, Hemosiderin Staining, Mottled, Pallor, Rubor, Erythema. Periwound temperature was noted as No Abnormality. The periwound has tenderness on palpation. Wound #8 status is Open. Original cause of wound was Gradually Appeared. The wound is located on the Right,Posterior Lower Leg. The wound measures 7.4cm length x 6cm width x  0.3cm depth; 34.872cm^2 area and 10.462cm^3 volume. The wound is limited to skin breakdown. There is no tunneling or undermining noted. There is a large amount of serous drainage noted. The wound margin is flat and intact. There is medium (34-66%) red granulation within the wound bed. There is a medium (34-66%) amount of necrotic tissue within the wound bed including Eschar and Adherent Slough. The periwound skin appearance exhibited: Localized Edema, Moist. The periwound skin appearance did not exhibit: Callus, Crepitus, Excoriation, Fluctuance, Friable, Induration, Rash, Scarring, Dry/Scaly, Maceration, Atrophie Blanche, Cyanosis, Ecchymosis, Hemosiderin Staining, Mottled, Pallor, Rubor, Erythema. Periwound temperature was noted as No Abnormality. The periwound has tenderness on palpation. Wound #9 status is Healed - Epithelialized. Original cause of wound was Gradually Appeared. The wound is located on the Left,Posterior Lower Leg. The wound measures 0cm length x 0cm width x 0cm depth; 0cm^2 area and 0cm^3 volume. Assessment Active Problems KAMIYA, ACORD. ([id number]) ICD-10 E11.622 - Type 2 diabetes mellitus with other skin ulcer N18.6 - End stage renal disease L97.222 - Non-pressure chronic ulcer of left calf with fat layer exposed L97.212 - Non-pressure chronic ulcer of right calf with fat layer exposed R60.0 - Localized edema I89.0 - Lymphedema, not elsewhere classified Chronic bilateral lower extremity calf ulcerations. Lymphedema. 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 There was a Skin/Subcutaneous Tissue Debridement ([id number]) debridement with total area of 20 sq cm performed by BURNS III, Melanie Crazier., MD. with the following instrument(s): Curette to remove Viable and Non-Viable tissue/material including Exudate, Fat, Fibrin/Slough, and Subcutaneous after achieving pain control using Other (lidocaine 4% cream). A time  out was conducted prior to the start of the procedure. A Minimum amount of bleeding was controlled with Pressure. The procedure was tolerated well with a pain level of 0 throughout and a pain level of 0 following the procedure. Post Debridement  Measurements: 20cm length x 21cm width x 0.4cm depth; 131.947cm^3 volume. Post procedure Diagnosis Wound #1: Same as Pre-Procedure Wound #3 Wound #3 is a Venous Leg Ulcer located on the Left,Lateral Lower Leg . There was a Skin/Subcutaneous Tissue Debridement ([id number]) debridement with total area of 101.25 sq cm performed by BURNS III, Melanie Crazier., MD. with the following instrument(s): Curette to remove Viable and Non-Viable tissue/material including Exudate, Fat, Fibrin/Slough, and Subcutaneous after achieving pain control using Other (lidocaine 4% cream). A time out was conducted prior to the start of the procedure. A Minimum amount of bleeding was controlled with Pressure. The procedure was tolerated well with a pain level of 0 throughout and a pain level of 0 following the procedure. Post Debridement Measurements: 13.5cm length x 7.5cm width x 0.5cm depth; 39.761cm^3 volume. Post procedure Diagnosis Wound #3: Same as Pre-Procedure Wound #8 Wound #8 is a Venous Leg Ulcer located on the Right,Posterior Lower Leg . There was a Skin/Subcutaneous Tissue Debridement ([id number]) debridement with total area of 44.4 sq cm performed by BURNS III, Melanie Crazier., MD. with the following instrument(s): Curette to remove Viable and Non-Viable tissue/material including Exudate, Fat, Fibrin/Slough, and Subcutaneous after achieving pain control using Other (lidocaine 4% cream). A time out was conducted prior to the start of the procedure. A Minimum amount of bleeding was controlled with Pressure. The procedure was tolerated well with a pain level of 0 throughout and a pain level of 0 following the procedure. Post Debridement Measurements: 7.4cm Shin, Rionna [name].  ([id number]) length x 6cm width x 0.4cm depth; 13.949cm^3 volume. Post procedure Diagnosis Wound #8: Same as Pre-Procedure Plan Wound Cleansing: Wound #1 Right,Anterior Lower Leg: Clean wound with Normal Saline. Cleanse wound with mild soap and water May Shower, gently pat wound dry prior to applying new dressing. Wound #3 Left,Lateral Lower Leg: Clean wound with Normal Saline. Cleanse wound with mild soap and water May Shower, gently pat wound dry prior to applying new dressing. Wound #8 Right,Posterior Lower Leg: Clean wound with Normal Saline. Cleanse wound with mild soap and water May Shower, gently pat wound dry prior to applying new dressing. Anesthetic: Wound #1 Right,Anterior Lower Leg: Topical Lidocaine 4% cream applied to wound bed prior to debridement Wound #3 Left,Lateral Lower Leg: Topical Lidocaine 4% cream applied to wound bed prior to debridement Wound #8 Right,Posterior Lower Leg: Topical Lidocaine 4% cream applied to wound bed prior to debridement Skin Barriers/Peri-Wound Care: Wound #1 Right,Anterior Lower Leg: Barrier cream Wound #3 Left,Lateral Lower Leg: Barrier cream Wound #8 Right,Posterior Lower Leg: Barrier cream Primary Wound Dressing: Wound #1 Right,Anterior Lower Leg: Aquacel Ag - or equivalent (No foam) Wound #3 Left,Lateral Lower Leg: Aquacel Ag - or equivalent (No foam) Wound #8 Right,Posterior Lower Leg: Aquacel Ag - or equivalent (No foam) Secondary Dressing: Wound #1 Right,Anterior Lower Leg: ABD pad - please add ABD pad to dressing Wound #3 Left,Lateral Lower Leg: ABD pad - please add ABD pad to dressing Wound #8 Right,Posterior Lower Leg: Mcmanaman, Zharia [name]. ([id number]) ABD pad - please add ABD pad to dressing Dressing Change Frequency: Wound #1 Right,Anterior Lower Leg: Change Dressing Monday, Wednesday, Friday - Wednesday in wound care center Wound #3 Left,Lateral Lower Leg: Change Dressing Monday, Wednesday, Friday - Wednesday  in wound care center Wound #8 Right,Posterior Lower Leg: Change Dressing Monday, Wednesday, Friday - Wednesday in wound care center Follow-up Appointments: Wound #1 Right,Anterior Lower Leg: Return Appointment in 1 week. Wound #3 Left,Lateral Lower Leg: Return Appointment in 1 week. Wound #  8 Right,Posterior Lower Leg: Return Appointment in 1 week. Edema Control: Wound #1 Right,Anterior Lower Leg: 3 Layer Compression System - Bilateral - Profore lite Elevate legs to the level of the heart and pump ankles as often as possible - Elevate legs at Dialysis Wound #3 Left,Lateral Lower Leg: 3 Layer Compression System - Bilateral - Profore lite Elevate legs to the level of the heart and pump ankles as often as possible - Elevate legs at Dialysis Wound #8 Right,Posterior Lower Leg: 3 Layer Compression System - Bilateral - Profore lite Elevate legs to the level of the heart and pump ankles as often as possible - Elevate legs at Dialysis Off-Loading: Wound #1 Right,Anterior Lower Leg: Turn and reposition every 2 hours Wound #3 Left,Lateral Lower Leg: Turn and reposition every 2 hours Wound #8 Right,Posterior Lower Leg: Turn and reposition every 2 hours Additional Orders / Instructions: Wound #1 Right,Anterior Lower Leg: Activity as tolerated Wound #3 Left,Lateral Lower Leg: Activity as tolerated Wound #8 Right,Posterior Lower Leg: Activity as tolerated Home Health: Wound #1 Right,Anterior Lower Leg: Continue Home Health Visits - [facility]: Monday, Wednesday and Friday **WRAP GOES FROM BASE OF TOES TO THREE FINGER WIDTHS BENEATH BEND IN KNEE. Home Health Nurse may visit PRN to address patient s wound care needs. FACE TO FACE ENCOUNTER: MEDICARE and MEDICAID PATIENTS: I certify that this patient is under my care and that I had a face-to-face encounter that meets the physician face-to-face encounter requirements with this patient on this date. The encounter with the patient was in whole or  in part for the following MEDICAL CONDITION: (primary reason for Home Healthcare) MEDICAL NECESSITY: I certify, that based on my findings, NURSING services are a medically necessary home health service. HOME BOUND STATUS: I certify that my clinical findings support that this patient is homebound (i.e., Due to illness or injury, pt requires aid of supportive devices such as crutches, cane, wheelchairs, walkers, the use Jagodzinski, Aleshka [name]. ([id number]) of special transportation or the assistance of another person to leave their place of residence. There is a normal inability to leave the home and doing so requires considerable and taxing effort. Other absences are for medical reasons / religious services and are infrequent or of short duration when for other reasons). If current dressing causes regression in wound condition, may D/C ordered dressing product/s and apply Normal Saline Moist Dressing daily until next Wound Healing Center / Other MD appointment. Notify Wound Healing Center of regression in wound condition at [phone]. Please direct any NON-WOUND related issues/requests for orders to patient's Primary Care Physician Wound #3 Left,Lateral Lower Leg: Continue Home Health Visits - [facility]: Monday, Wednesday and Friday **WRAP GOES FROM BASE OF TOES TO THREE FINGER WIDTHS BENEATH BEND IN KNEE. Home Health Nurse may visit PRN to address patient s wound care needs. FACE TO FACE ENCOUNTER: MEDICARE and MEDICAID PATIENTS: I certify that this patient is under my care and that I had a face-to-face encounter that meets the physician face-to-face encounter requirements with this patient on this date. The encounter with the patient was in whole or in part for the following MEDICAL CONDITION: (primary reason for Home Healthcare) MEDICAL NECESSITY: I certify, that based on my findings, NURSING services are a medically necessary home health service. HOME BOUND STATUS: I certify that my clinical  findings support that this patient is homebound (i.e., Due to illness or injury, pt requires aid of supportive devices such as crutches, cane, wheelchairs, walkers, the use of special transportation or the assistance  of another person to leave their place of residence. There is a normal inability to leave the home and doing so requires considerable and taxing effort. Other absences are for medical reasons / religious services and are infrequent or of short duration when for other reasons). If current dressing causes regression in wound condition, may D/C ordered dressing product/s and apply Normal Saline Moist Dressing daily until next Wound Healing Center / Other MD appointment. Notify Wound Healing Center of regression in wound condition at [phone]. Please direct any NON-WOUND related issues/requests for orders to patient's Primary Care Physician Wound #8 Right,Posterior Lower Leg: Continue Home Health Visits - [facility]: Monday, Wednesday and Friday **WRAP GOES FROM BASE OF TOES TO THREE FINGER WIDTHS BENEATH BEND IN KNEE. Home Health Nurse may visit PRN to address patient s wound care needs. FACE TO FACE ENCOUNTER: MEDICARE and MEDICAID PATIENTS: I certify that this patient is under my care and that I had a face-to-face encounter that meets the physician face-to-face encounter requirements with this patient on this date. The encounter with the patient was in whole or in part for the following MEDICAL CONDITION: (primary reason for Home Healthcare) MEDICAL NECESSITY: I certify, that based on my findings, NURSING services are a medically necessary home health service. HOME BOUND STATUS: I certify that my clinical findings support that this patient is homebound (i.e., Due to illness or injury, pt requires aid of supportive devices such as crutches, cane, wheelchairs, walkers, the use of special transportation or the assistance of another person to leave their place of residence. There is  a normal inability to leave the home and doing so requires considerable and taxing effort. Other absences are for medical reasons / religious services and are infrequent or of short duration when for other reasons). If current dressing causes regression in wound condition, may D/C ordered dressing product/s and apply Normal Saline Moist Dressing daily until next Wound Healing Center / Other MD appointment. Notify Wound Healing Center of regression in wound condition at [phone]. Please direct any NON-WOUND related issues/requests for orders to patient's Primary Care Physician Continue with silver alginate and Profore light compression bandages 3 times weekly. BRENDALY, TOWNSEL ([id number]) Electronic Signature(s) Signed: 08/02/2015 11:51:43 AM By: Madelaine Bhat MD Entered By: Madelaine Bhat on 08/02/2015 09:18:31 Alton, Aaron [name] ([id number]) -------------------------------------------------------------------------------- SuperBill Details Patient Name: Flores, Jaskiran [name]. Date of Service: 08/02/2015 Medical Record Patient Account Number: [id number] Number: Treating RN: Phillis Haggis October 15, 1955 (59 y.o. 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-pressure chronic ulcer of right calf with fat layer exposed R60.0 Localized edema I89.0 Lymphedema, not elsewhere classified Facility Procedures CPT4 Code: 32440102 Description: 11042 - DEB SUBQ TISSUE 20 SQ CM/< ICD-10 Description Diagnosis L97.222 Non-pressure chronic ulcer of left calf with fat l L97.212 Non-pressure chronic ulcer of right calf  with fat Modifier: ayer exposed layer exposed Quantity: 1 CPT4 Code: [id number] Description: 11045 - DEB SUBQ TISS EA ADDL 20CM ICD-10 Description Diagnosis L97.222 Non-pressure chronic ulcer of left calf with fat l L97.212 Non-pressure chronic ulcer of right calf with fat Modifier: ayer exposed layer exposed Quantity: 8 Physician Procedures CPT4 Code: [id number] Description: 11042 - WC PHYS SUBQ TISS 20 SQ CM  ICD-10 Description Diagnosis L97.222 Non-pressure chronic ulcer of left calf with fat la L97.212 Non-pressure chronic ulcer of right calf with fat l Modifier: yer exposed ayer exposed Quantity: 1 CPT4 Code: [id number] Baylock, PAM Description: 11045 - WC PHYS SUBQ TISS EA ADDL 20 CM ELA [name]. ([id number]) Modifier: Quantity: 8 Electronic Signature(s) Signed: 08/02/2015 11:51:43 AM By: Madelaine Bhat MD Entered By: Madelaine Bhat on 08/02/2015 09:18:55

## 2015-08-09 ENCOUNTER — Encounter: Payer: BC Managed Care – PPO | Admitting: Surgery

## 2015-08-09 DIAGNOSIS — L97212 Non-pressure chronic ulcer of right calf with fat layer exposed: Secondary | ICD-10-CM | POA: Diagnosis not present

## 2015-08-09 DIAGNOSIS — L97222 Non-pressure chronic ulcer of left calf with fat layer exposed: Secondary | ICD-10-CM | POA: Diagnosis not present

## 2015-08-09 DIAGNOSIS — E11622 Type 2 diabetes mellitus with other skin ulcer: Secondary | ICD-10-CM | POA: Diagnosis not present

## 2015-08-09 DIAGNOSIS — Z992 Dependence on renal dialysis: Secondary | ICD-10-CM | POA: Diagnosis not present

## 2015-08-09 DIAGNOSIS — N186 End stage renal disease: Secondary | ICD-10-CM | POA: Diagnosis not present

## 2015-08-09 DIAGNOSIS — R600 Localized edema: Secondary | ICD-10-CM | POA: Diagnosis not present

## 2015-08-10 NOTE — Progress Notes (Signed)
[name], [name] ([id number]) Visit Report for 08/09/2015 Chief Complaint Document Details Patient Name: [name], [name] 08/09/2015 8:00 Date of Service: AM Medical Record [id number] Number: Patient Account Number: [id number] [birth year] (60 y.o. Treating RN: Phillis Haggis Date of Birth/Sex: Female) Other Clinician: Primary Care Physician: Fidel Levy Treating Evlyn Kanner Referring Physician: Fidel Levy Physician/Extender: Tania Ade in Treatment: 27 Information Obtained from: Patient Chief Complaint Chronic bilateral calf ulcers. Electronic Signature(s) Signed: 08/09/2015 8:54:14 AM By: Evlyn Kanner MD, FACS Entered By: Evlyn Kanner on 08/09/2015 08:54:14 Scott, [name] ([id number]) -------------------------------------------------------------------------------- Debridement Details Patient Name: [name]. 08/09/2015 8:00 Date of Service: AM Medical Record [id number] Number: Patient Account Number: [id number] 1955/10/01 (60 y.o. Treating RN: Phillis Haggis Date of Birth/Sex: Female) Other Clinician: Primary Care Physician: Jonna Clark, JAMES Treating Davidlee Jeanbaptiste Referring Physician: Fidel Levy Physician/Extender: Tania Ade in Treatment: 27 Debridement Performed for Wound #1 Right,Anterior Lower Leg Assessment: Performed By: Physician Evlyn Kanner, MD Debridement: Debridement Pre-procedure Yes Verification/Time Out Taken: Start Time: 08:34 Pain Control: Other : lidocaine 4% cream Level: Skin/Subcutaneous Tissue Total Area Debrided (L x 3 (cm) x 2 (cm) = 6 (cm) W): Tissue and other Viable, Non-Viable, Exudate, Fibrin/Slough, Subcutaneous material debrided: Instrument: Forceps Bleeding: Minimum Hemostasis Achieved: Pressure End Time: 08:49 Procedural Pain: 0 Post Procedural Pain: 0 Response to Treatment: Procedure was tolerated well Post Debridement Measurements of Total Wound Length: (cm) 9.5 Width: (cm) 20.5 Depth:  (cm) 0.4 Volume: (cm) 61.183 Post Procedure Diagnosis Same as Pre-procedure Electronic Signature(s) Signed: 08/09/2015 8:53:28 AM By: Evlyn Kanner MD, FACS Signed: 08/09/2015 5:42:28 PM By: Alejandro Mulling Entered By: Evlyn Kanner on 08/09/2015 08:53:28 Trine, Cambry Rexene [name] ([id number]) -------------------------------------------------------------------------------- Debridement Details Patient Name: [name] Furnace H. 08/09/2015 8:00 Date of Service: AM Medical Record [id number] Number: Patient Account Number: [id number] 05/14/1956 (60 y.o. Treating RN: Phillis Haggis Date of Birth/Sex: Female) Other Clinician: Primary Care Physician: Jonna Clark, JAMES Treating Ellyse Rotolo Referring Physician: Fidel Levy Physician/Extender: Tania Ade in Treatment: 27 Debridement Performed for Wound #3 Left,Lateral Lower Leg Assessment: Performed By: Physician Evlyn Kanner, MD Debridement: Debridement Pre-procedure Yes Verification/Time Out Taken: Start Time: 08:34 Pain Control: Other : lidocaine 4% cream Level: Skin/Subcutaneous Tissue Total Area Debrided (L x 5 (cm) x 3 (cm) = 15 (cm) W): Tissue and other Viable, Non-Viable, Exudate, Fibrin/Slough, Subcutaneous material debrided: Instrument: Forceps Bleeding: Minimum Hemostasis Achieved: Pressure End Time: 08:49 Procedural Pain: 0 Post Procedural Pain: 0 Response to Treatment: Procedure was tolerated well Post Debridement Measurements of Total Wound Length: (cm) 13.5 Width: (cm) 7.5 Depth: (cm) 0.5 Volume: (cm) 39.761 Post Procedure Diagnosis Same as Pre-procedure Electronic Signature(s) Signed: 08/09/2015 8:53:45 AM By: Evlyn Kanner MD, FACS Signed: 08/09/2015 5:42:28 PM By: Alejandro Mulling Entered By: Evlyn Kanner on 08/09/2015 08:53:45 Pretty, Amanada Rexene [name] ([id number]) -------------------------------------------------------------------------------- Debridement Details Patient Name: [name] Furnace H.  08/09/2015 8:00 Date of Service: AM Medical Record [id number] Number: Patient Account Number: [id number] [birth year] (60 y.o. Treating RN: Phillis Haggis Date of Birth/Sex: Female) Other Clinician: Primary Care Physician: Jonna Clark, JAMES Treating Evlyn Kanner Referring Physician: Fidel Levy Physician/Extender: Tania Ade in Treatment: 27 Debridement Performed for Wound #8 Right,Posterior Lower Leg Assessment: Performed By: Physician Evlyn Kanner, MD Debridement: Debridement Pre-procedure Yes Verification/Time Out Taken: Start Time: 08:34 Pain Control: Other : lidocaine 4% cream Level: Skin/Subcutaneous Tissue Total Area Debrided (L x 2 (cm) x 4 (cm) = 8 (cm) W): Tissue and other Viable, Non-Viable, Exudate, Fibrin/Slough, Subcutaneous material debrided: Instrument: Forceps Bleeding: Minimum Hemostasis Achieved: Pressure End Time: 08:49  Procedural Pain: 0 Post Procedural Pain: 0 Response to Treatment: Procedure was tolerated well Post Debridement Measurements of Total Wound Length: (cm) 6.2 Width: (cm) 7 Depth: (cm) 0.4 Volume: (cm) 13.635 Post Procedure Diagnosis Same as Pre-procedure Electronic Signature(s) Signed: 08/09/2015 8:54:08 AM By: Evlyn Kanner MD, FACS Signed: 08/09/2015 5:42:28 PM By: Alejandro Mulling Entered By: Evlyn Kanner on 08/09/2015 08:54:08 Bangerter, [name] ([id number]) -------------------------------------------------------------------------------- HPI Details Patient Name: [name]. 08/09/2015 8:00 Date of Service: AM Medical Record [id number] Number: Patient Account Number: [id number] [birth year] (60 y.o. Treating RN: Phillis Haggis Date of Birth/Sex: Female) Other Clinician: Primary Care Physician: Roger Shelter Referring Physician: Fidel Levy Physician/Extender: Tania Ade in Treatment: 27 History of Present Illness HPI Description: Pleasant 60 year old with h/o DM (Hgb A1c 7.4 in  Aug 2016), ESRD (on hemodialysis since July 2016). Presented to her PCP, Dr. Venora Maples, with BLE ulcers since June 2016. 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 Left lower extremity ultrasound in May 2016 showed no evidence for DVT. No assessment for venous insufficiency. Culture 04/19/2015 grew methicillin sensitive staph aureus (sensitive to tetracycline), Stenotrophomonas maltophilia, and Enterococcus faecalis. Completed course of doxycycline. Hospitalized in Nov 2016 for worsening cellulitis. Treated with IV antibiotics. No operative debridement or biopsy. Punch biopsy of left calf ulcer 06/28/2015 showed no evidence for malignancy. Bilateral lower extremity venous ultrasound 07/20/2015 showed no evidence for DVT or chronic venous insufficiency. Performing dressing changes with silver alginate. Tolerating Profore light bilaterally 3x/week. She returns to clinic and is without complaints. No significant pain. No fever or chills. Less drainage. 08/09/2015 -- details over the last several months noted. Continues to stay hemodialysis 3 times a week. Says she is improving slowly and the pain is much better Electronic Signature(s) Signed: 08/09/2015 8:54:49 AM By: Evlyn Kanner MD, FACS Entered By: Evlyn Kanner on 08/09/2015 08:54:49 Lansky, [name] ([id number]) -------------------------------------------------------------------------------- Physical Exam Details Patient Name: KAHLANI, GRABER 08/09/2015 8:00 Date of Service: AM Medical Record [id number] Number: Patient Account Number: [id number] 20-Feb-1956 (59 y.o. Treating RN: Phillis Haggis Date of Birth/Sex: Female) Other Clinician: Primary Care Physician: Fidel Levy Treating Evlyn Kanner Referring Physician: Fidel Levy Physician/Extender: Weeks in Treatment: 27 Constitutional . Pulse  regular. Respirations normal and unlabored. Afebrile. . Eyes Nonicteric. Reactive to light. Ears, Nose, Mouth, and Throat Lips, teeth, and gums WNL.Marland Kitchen Moist mucosa without lesions. Neck supple and nontender. No palpable supraclavicular or cervical adenopathy. Normal sized without goiter. Respiratory WNL. No retractions.. Cardiovascular Pedal Pulses WNL. No clubbing, cyanosis or edema. Lymphatic No adneopathy. No adenopathy. No adenopathy. Musculoskeletal Adexa without tenderness or enlargement.. Digits and nails w/o clubbing, cyanosis, infection, petechiae, ischemia, or inflammatory conditions.. Integumentary (Hair, Skin) No suspicious lesions. No crepitus or fluctuance. No peri-wound warmth or erythema. No masses.Marland Kitchen Psychiatric Judgement and insight Intact.. No evidence of depression, anxiety, or agitation.. Notes bilateral lower extremity ulceration with significant amount of necrotic debris and eschar and exudate which have been sharply dissected with forcep, moist saline gauze and abrasion. Much cleaner after debriding and has some healthy granulation tissue. No cellulitis but has a fungal infection on the feet and lower extremities Electronic Signature(s) Signed: 08/09/2015 8:55:45 AM By: Evlyn Kanner MD, FACS Entered By: Evlyn Kanner on 08/09/2015 08:55:45 Whittenberg, [name] ([id number]) -------------------------------------------------------------------------------- Physician Orders Details Patient Name: [name] Furnace H. 08/09/2015 8:00 Date of Service: AM Medical Record [id number] Number: Patient Account Number: [id number] [birth year] (59  y.o. Treating RN: Ashok Cordia, Debi Date of Birth/Sex: Female) Other Clinician: Primary Care Physician: Jonna Clark, JAMES Treating Evlyn Kanner Referring Physician: Fidel Levy Physician/Extender: Tania Ade in Treatment: 55 Verbal / Phone Orders: No Diagnosis Coding Patient Medications Allergies: Vicodin Notifications  Medication Indication Start End Antifungal (clotrimazole) 08/09/2015 DOSE topical 1 % cream - cream topical apply daily Electronic Signature(s) Signed: 08/09/2015 8:52:48 AM By: Evlyn Kanner MD, FACS Entered By: Evlyn Kanner on 08/09/2015 08:52:48 Keshishyan, [name] ([id number]) -------------------------------------------------------------------------------- Problem List Details Patient Name: [name]. 08/09/2015 8:00 Date of Service: AM Medical Record [id number] Number: Patient Account Number: [id number] 1955/08/08 (59 y.o. 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 08/09/2015 8:00 Date of Service: AM Medical Record [id number] Number: Patient Account Number: [id number] 28-Mar-1956 (59 y.o. Treating RN: Phillis Haggis Date of Birth/Sex: Female) Other Clinician: Primary Care Physician: Fidel Levy Treating Evlyn Kanner Referring Physician: Fidel Levy Physician/Extender: Tania Ade in Treatment:  27 Subjective Chief Complaint Information obtained from Patient Chronic bilateral calf ulcers. History of Present Illness (HPI) Pleasant 59 year old with h/o DM (Hgb A1c 7.4 in Aug 2016), ESRD (on hemodialysis since July 2016). Presented to her PCP, Dr. Venora Maples, with BLE ulcers since June 2016. 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 Left lower extremity ultrasound in May 2016 showed no evidence for DVT. No assessment for venous insufficiency. Culture 04/19/2015 grew methicillin sensitive staph aureus (sensitive to tetracycline), Stenotrophomonas maltophilia, and Enterococcus faecalis. Completed course of doxycycline. Hospitalized in Nov 2016 for worsening cellulitis. Treated with IV antibiotics. No operative debridement or biopsy. Punch biopsy of left calf ulcer 06/28/2015 showed no evidence for malignancy. Bilateral lower extremity venous ultrasound 07/20/2015 showed no evidence for DVT or chronic venous insufficiency. Performing dressing changes with silver alginate. Tolerating Profore light bilaterally 3x/week. She returns to clinic and is without complaints. No significant pain. No fever or chills. Less drainage. 08/09/2015 -- details over the last several months noted. Continues to stay hemodialysis 3 times a week. Says she is improving slowly and the pain is much better Objective Constitutional Pulse regular. Respirations normal and unlabored. Afebrile. Vitals Time Taken: 8:03 AM, Height: 68 in, Weight: 294 lbs, BMI: 44.7, Temperature: 98.2 F, Pulse: 85 bpm, Respiratory Rate: 20 breaths/min, Blood Pressure: 159/68 mmHg. Spillane, Whitley H. ([id number]) Eyes Nonicteric. Reactive to light. Ears, Nose, Mouth, and Throat Lips, teeth, and gums WNL.Marland Kitchen Moist mucosa without lesions. Neck supple and nontender. No palpable supraclavicular or cervical adenopathy.  Normal sized without goiter. Respiratory WNL. No retractions.. Cardiovascular Pedal Pulses WNL. No clubbing, cyanosis or edema. Lymphatic No adneopathy. No adenopathy. No adenopathy. Musculoskeletal Adexa without tenderness or enlargement.. Digits and nails w/o clubbing, cyanosis, infection, petechiae, ischemia, or inflammatory conditions.Marland Kitchen Psychiatric  Judgement and insight Intact.. No evidence of depression, anxiety, or agitation.. General Notes: bilateral lower extremity ulceration with significant amount of necrotic debris and eschar and exudate which have been sharply dissected with forcep, moist saline gauze and abrasion. Much cleaner after debriding and has some healthy granulation tissue. No cellulitis but has a fungal infection on the feet and lower extremities Integumentary (Hair, Skin) No suspicious lesions. No crepitus or fluctuance. No peri-wound warmth or erythema. No masses.. Wound #1 status is Open. Original cause of wound was Gradually Appeared. The wound is located on the Right,Anterior Lower Leg. The wound measures 9.5cm length x 20.5cm width x 0.3cm depth; 152.956cm^2 area and 45.887cm^3 volume. The wound is limited to skin breakdown. There is no tunneling or undermining noted. There is a large amount of serous drainage noted. The wound margin is thickened. There is medium (34-66%) pink granulation within the wound bed. There is a medium (34-66%) amount of necrotic tissue within the wound bed including Eschar and Adherent Slough. The periwound skin appearance exhibited: Scarring, Moist. The periwound skin appearance did not exhibit: Callus, Crepitus, Excoriation, Fluctuance, Friable, Induration, Localized Edema, Rash, Dry/Scaly, Maceration, Atrophie Blanche, Cyanosis, Ecchymosis, Hemosiderin Staining, Mottled, Pallor, Rubor, Erythema. Periwound temperature was noted as No Abnormality. The periwound has tenderness on palpation. Wound #3 status is Open. Original cause of  wound was Gradually Appeared. The wound is located on the Left,Lateral Lower Leg. The wound measures 13.5cm length x 7.5cm width x 0.4cm depth; 79.522cm^2 area and 31.809cm^3 volume. The wound is limited to skin breakdown. There is no tunneling or undermining noted. There is a large amount of serous drainage noted. The wound margin is flat and intact. [name], Korynn H. ([id number]) There is medium (34-66%) red granulation within the wound bed. There is a medium (34-66%) amount of necrotic tissue within the wound bed including Adherent Slough. The periwound skin appearance exhibited: Scarring, Moist. The periwound skin appearance did not exhibit: Callus, Crepitus, Excoriation, Fluctuance, Friable, Induration, Localized Edema, Rash, Dry/Scaly, Maceration, Atrophie Blanche, Cyanosis, Ecchymosis, Hemosiderin Staining, Mottled, Pallor, Rubor, Erythema. Periwound temperature was noted as No Abnormality. The periwound has tenderness on palpation. Wound #8 status is Open. Original cause of wound was Gradually Appeared. The wound is located on the Right,Posterior Lower Leg. The wound measures 6.2cm length x 7cm width x 0.3cm depth; 34.086cm^2 area and 10.226cm^3 volume. The wound is limited to skin breakdown. There is no tunneling or undermining noted. There is a large amount of serous drainage noted. The wound margin is flat and intact. There is medium (34-66%) red granulation within the wound bed. There is a medium (34-66%) amount of necrotic tissue within the wound bed including Eschar and Adherent Slough. The periwound skin appearance exhibited: Localized Edema, Moist. The periwound skin appearance did not exhibit: Callus, Crepitus, Excoriation, Fluctuance, Friable, Induration, Rash, Scarring, Dry/Scaly, Maceration, Atrophie Blanche, Cyanosis, Ecchymosis, Hemosiderin Staining, Mottled, Pallor, Rubor, Erythema. Periwound temperature was noted as No Abnormality. The periwound has tenderness on  palpation. Assessment Active Problems ICD-10 E11.622 - Type 2 diabetes mellitus with other skin ulcer N18.6 - End stage renal disease L97.222 - Non-pressure chronic ulcer of left calf with fat layer exposed L97.212 - Non-pressure chronic ulcer of right calf with fat layer exposed R60.0 - Localized edema I89.0 - Lymphedema, not elsewhere classified I will prescribe some Lotrisone cream 1% for the antifungal treatment and continue silver alginate and 3 layer compression wrap as before to be changed twice a week by home health. She will follow-up next week.  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 There was a Skin/Subcutaneous Tissue Debridement ([id number]) debridement with total area of 6 sq cm performed by Evlyn Kanner, MD. with the following instrument(s): Forceps to remove Viable and Non-Viable Ruppe, Mindee H. ([id number]) tissue/material including Exudate, Fibrin/Slough, and Subcutaneous after achieving pain control using Other (lidocaine 4% cream). A time out was conducted prior to the start of the procedure. A Minimum amount of bleeding was controlled with Pressure. The procedure was tolerated well with a pain level of 0 throughout and a pain level of 0 following the procedure. Post Debridement Measurements: 9.5cm length x 20.5cm width x 0.4cm depth; 61.183cm^3 volume. Post procedure Diagnosis Wound #1: Same as Pre-Procedure Wound #3 Wound #3 is a Venous Leg Ulcer located on the Left,Lateral Lower Leg . There was a Skin/Subcutaneous Tissue Debridement ([id number]) debridement with total area of 15 sq cm performed by Evlyn Kanner, MD. with the following instrument(s): Forceps to remove Viable and Non-Viable tissue/material including Exudate, Fibrin/Slough, and Subcutaneous after achieving pain control using Other (lidocaine 4% cream). A time out was conducted prior to the start of the procedure. A Minimum amount of bleeding  was controlled with Pressure. The procedure was tolerated well with a pain level of 0 throughout and a pain level of 0 following the procedure. Post Debridement Measurements: 13.5cm length x 7.5cm width x 0.5cm depth; 39.761cm^3 volume. Post procedure Diagnosis Wound #3: Same as Pre-Procedure Wound #8 Wound #8 is a Venous Leg Ulcer located on the Right,Posterior Lower Leg . There was a Skin/Subcutaneous Tissue Debridement ([id number]) debridement with total area of 8 sq cm performed by Evlyn Kanner, MD. with the following instrument(s): Forceps to remove Viable and Non-Viable tissue/material including Exudate, Fibrin/Slough, and Subcutaneous after achieving pain control using Other (lidocaine 4% cream). A time out was conducted prior to the start of the procedure. A Minimum amount of bleeding was controlled with Pressure. The procedure was tolerated well with a pain level of 0 throughout and a pain level of 0 following the procedure. Post Debridement Measurements: 6.2cm length x 7cm width x 0.4cm depth; 13.635cm^3 volume. Post procedure Diagnosis Wound #8: Same as Pre-Procedure Plan Wound Cleansing: Wound #1 Right,Anterior Lower Leg: Clean wound with Normal Saline. Cleanse wound with mild soap and water May Shower, gently pat wound dry prior to applying new dressing. Wound #3 Left,Lateral Lower Leg: Clean wound with Normal Saline. Cleanse wound with mild soap and water May Shower, gently pat wound dry prior to applying new dressing. Wound #8 Right,Posterior Lower Leg: Clean wound with Normal Saline. Cleanse wound with mild soap and water Jann, Casee H. ([id number]) May Shower, gently pat wound dry prior to applying new dressing. Anesthetic: Wound #1 Right,Anterior Lower Leg: Topical Lidocaine 4% cream applied to wound bed prior to debridement Wound #3 Left,Lateral Lower Leg: Topical Lidocaine 4% cream applied to wound bed prior to debridement Wound #8 Right,Posterior Lower  Leg: Topical Lidocaine 4% cream applied to wound bed prior to debridement Skin Barriers/Peri-Wound Care: Wound #1 Right,Anterior Lower Leg: Barrier cream Wound #3 Left,Lateral Lower Leg: Barrier cream Wound #8 Right,Posterior Lower Leg: Barrier cream Primary Wound Dressing: Wound #1 Right,Anterior Lower Leg: Aquacel Ag - or equivalent (No foam) Wound #3 Left,Lateral Lower Leg: Aquacel Ag - or equivalent (No foam) Wound #8 Right,Posterior Lower Leg: Aquacel Ag - or equivalent (No foam) Secondary Dressing: Wound #1 Right,Anterior Lower Leg: ABD pad - please add ABD pad to dressing Wound #3  Left,Lateral Lower Leg: ABD pad - please add ABD pad to dressing Wound #8 Right,Posterior Lower Leg: ABD pad - please add ABD pad to dressing Dressing Change Frequency: Wound #1 Right,Anterior Lower Leg: Change Dressing Monday, Wednesday, Friday - Wednesday in wound care center Wound #3 Left,Lateral Lower Leg: Change Dressing Monday, Wednesday, Friday - Wednesday in wound care center Wound #8 Right,Posterior Lower Leg: Change Dressing Monday, Wednesday, Friday - Wednesday in wound care center Follow-up Appointments: Wound #1 Right,Anterior Lower Leg: Return Appointment in 1 week. Wound #3 Left,Lateral Lower Leg: Return Appointment in 1 week. Wound #8 Right,Posterior Lower Leg: Return Appointment in 1 week. Edema Control: Wound #1 Right,Anterior Lower Leg: 3 Layer Compression System - Bilateral - Profore lite Elevate legs to the level of the heart and pump ankles as often as possible - Elevate legs at Dialysis Wound #3 Left,Lateral Lower Leg: 3 Layer Compression System - Bilateral - Profore lite Elevate legs to the level of the heart and pump ankles as often as possible - Elevate legs at Dialysis Wound #8 Right,Posterior Lower Leg: Kosek, Eriel H. ([id number]) 3 Layer Compression System - Bilateral - Profore lite Elevate legs to the level of the heart and pump ankles as often as  possible - Elevate legs at Dialysis Off-Loading: Wound #1 Right,Anterior Lower Leg: Turn and reposition every 2 hours Wound #3 Left,Lateral Lower Leg: Turn and reposition every 2 hours Wound #8 Right,Posterior Lower Leg: Turn and reposition every 2 hours Additional Orders / Instructions: Wound #1 Right,Anterior Lower Leg: Activity as tolerated Wound #3 Left,Lateral Lower Leg: Activity as tolerated Wound #8 Right,Posterior Lower Leg: Activity as tolerated Home Health: Wound #1 Right,Anterior Lower Leg: Continue Home Health Visits - [facility]: Monday, Wednesday and Friday **WRAP GOES FROM BASE OF TOES TO THREE FINGER WIDTHS BENEATH BEND IN KNEE. Home Health Nurse may visit PRN to address patient s wound care needs. FACE TO FACE ENCOUNTER: MEDICARE and MEDICAID PATIENTS: I certify that this patient is under my care and that I had a face-to-face encounter that meets the physician face-to-face encounter requirements with this patient on this date. The encounter with the patient was in whole or in part for the following MEDICAL CONDITION: (primary reason for Home Healthcare) MEDICAL NECESSITY: I certify, that based on my findings, NURSING services are a medically necessary home health service. HOME BOUND STATUS: I certify that my clinical findings support that this patient is homebound (i.e., Due to illness or injury, pt requires aid of supportive devices such as crutches, cane, wheelchairs, walkers, the use of special transportation or the assistance of another person to leave their place of residence. There is a normal inability to leave the home and doing so requires considerable and taxing effort. Other absences are for medical reasons / religious services and are infrequent or of short duration when for other reasons). If current dressing causes regression in wound condition, may D/C ordered dressing product/s and apply Normal Saline Moist Dressing daily until next Wound Healing Center /  Other MD appointment. Notify Wound Healing Center of regression in wound condition at [phone]. Please direct any NON-WOUND related issues/requests for orders to patient's Primary Care Physician Wound #3 Left,Lateral Lower Leg: Continue Home Health Visits - [facility]: Monday, Wednesday and Friday **WRAP GOES FROM BASE OF TOES TO THREE FINGER WIDTHS BENEATH BEND IN KNEE. Home Health Nurse may visit PRN to address patient s wound care needs. FACE TO FACE ENCOUNTER: MEDICARE and MEDICAID PATIENTS: I certify that this patient is under my care  and that I had a face-to-face encounter that meets the physician face-to-face encounter requirements with this patient on this date. The encounter with the patient was in whole or in part for the following MEDICAL CONDITION: (primary reason for Home Healthcare) MEDICAL NECESSITY: I certify, that based on my findings, NURSING services are a medically necessary home health service. HOME BOUND STATUS: I certify that my clinical findings support that this patient is homebound (i.e., Due to illness or injury, pt requires aid of supportive devices such as crutches, cane, wheelchairs, walkers, the use of special transportation or the assistance of another person to leave their place of residence. There is a normal inability to leave the home and doing so requires considerable and taxing effort. Other absences are for medical reasons / religious services and are infrequent or of short duration when for other reasons). If current dressing causes regression in wound condition, may D/C ordered dressing product/s and apply Normal Saline Moist Dressing daily until next Wound Healing Center / Other MD appointment. Notify [facility] ([id number]) [facility] of regression in wound condition at [phone]. Please direct any NON-WOUND related issues/requests for orders to patient's Primary Care Physician Wound #8 Right,Posterior Lower Leg: Continue Home  Health Visits - [facility]: Monday, Wednesday and Friday **WRAP GOES FROM BASE OF TOES TO THREE FINGER WIDTHS BENEATH BEND IN KNEE. Home Health Nurse may visit PRN to address patient s wound care needs. FACE TO FACE ENCOUNTER: MEDICARE and MEDICAID PATIENTS: I certify that this patient is under my care and that I had a face-to-face encounter that meets the physician face-to-face encounter requirements with this patient on this date. The encounter with the patient was in whole or in part for the following MEDICAL CONDITION: (primary reason for Home Healthcare) MEDICAL NECESSITY: I certify, that based on my findings, NURSING services are a medically necessary home health service. HOME BOUND STATUS: I certify that my clinical findings support that this patient is homebound (i.e., Due to illness or injury, pt requires aid of supportive devices such as crutches, cane, wheelchairs, walkers, the use of special transportation or the assistance of another person to leave their place of residence. There is a normal inability to leave the home and doing so requires considerable and taxing effort. Other absences are for medical reasons / religious services and are infrequent or of short duration when for other reasons). If current dressing causes regression in wound condition, may D/C ordered dressing product/s and apply Normal Saline Moist Dressing daily until next Wound Healing Center / Other MD appointment. Notify Wound Healing Center of regression in wound condition at [phone]. Please direct any NON-WOUND related issues/requests for orders to patient's Primary Care Physician Medications-please add to medication list.: Wound #1 Right,Anterior Lower Leg: Other: - Antifungal Cream to bilateral lower legs, NOT ON WOUNDS Wound #3 Left,Lateral Lower Leg: Other: - Antifungal Cream to bilateral lower legs, NOT ON WOUNDS Wound #8 Right,Posterior Lower Leg: Other: - Antifungal Cream to bilateral lower legs,  NOT ON WOUNDS I will prescribe some Lotrisone cream 1% for the antifungal treatment and continue silver alginate and 3 layer compression wrap as before to be changed twice a week by home health. She will follow-up next week. Electronic Signature(s) Signed: 08/09/2015 8:56:27 AM By: Evlyn Kanner MD, FACS Entered By: Evlyn Kanner on 08/09/2015 08:56:27 Burks, [name] ([id number]) -------------------------------------------------------------------------------- SuperBill Details Patient Name: Brunell, Lycia H. Date of Service: 08/09/2015 Medical Record Number: [id number] Patient Account Number: [id number] Date of Birth/Sex: December 30, 1955 (59 y.o.  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 R60.0 Localized edema I89.0 Lymphedema, not elsewhere classified Facility Procedures CPT4 Code: 16109604 Description: 11042 - DEB SUBQ TISSUE 20 SQ CM/< ICD-10 Description Diagnosis E11.622 Type 2 diabetes mellitus with other skin ulcer L97.222 Non-pressure chronic ulcer of left calf with fat l L97.212 Non-pressure chronic ulcer of right calf with fat I89.0  Lymphedema, not elsewhere classified Modifier: ayer exposed layer exposed Quantity: 1 CPT4 Code: [id number] Description: 11045 - DEB SUBQ TISS EA ADDL 20CM ICD-10 Description Diagnosis E11.622 Type 2 diabetes mellitus with other skin ulcer L97.222 Non-pressure chronic ulcer of left calf with fat l N18.6 End stage renal disease I89.0 Lymphedema, not elsewhere  classified Modifier: ayer exposed Quantity: 1 Physician  Procedures CPT4 Code: [id number] Mondesir, PAM Description: 11042 - WC PHYS SUBQ TISS 20 SQ CM ICD-10 Description Diagnosis E11.622 Type 2 diabetes mellitus with other skin ulcer L97.222 Non-pressure chronic ulcer of left calf with fat la ELA H. ([id number]) Modifier: yer exposed Quantity: 1 Electronic Signature(s) Signed: 08/09/2015 8:56:54 AM By: Evlyn Kanner MD, FACS Entered By: Evlyn Kanner on 08/09/2015 08:56:54

## 2015-08-10 NOTE — Progress Notes (Signed)
PRIM, MORACE ([id number]) Visit Report for 08/09/2015 Arrival Information Details Patient Name: [name], [name]. Date of Service: 08/09/2015 8:00 AM Medical Record Number: [id number] Patient Account Number: [id number] Date of Birth/Sex: 1955-11-14 (60 y.o. Female) Treating RN: Ashok Cordia, Debi Primary Care Physician: Fidel Levy Other Clinician: Referring Physician: Fidel Levy Treating Physician/Extender: Rudene Re in Treatment: 62 Visit Information History Since Last Visit All ordered tests and consults were completed: No Patient Arrived: Ambulatory Added or deleted any medications: No Arrival Time: 08:03 Any new allergies or adverse reactions: No Accompanied By: self Had a fall or experienced change in No Transfer Assistance: None activities of daily living that may affect Patient Identification Verified: Yes risk of falls: Secondary Verification Process Yes Signs or symptoms of abuse/neglect since last No Completed: visito Patient Requires Transmission- No Hospitalized since last visit: No Based Precautions: Pain Present Now: No Patient Has Alerts: Yes Patient Alerts: DMII ABI [place] Bilateral Electronic Signature(s) Signed: 08/09/2015 5:42:28 PM By: Alejandro Mulling Entered By: Alejandro Mulling on 08/09/2015 08:03:42 Duskin, [name] ([id number]) -------------------------------------------------------------------------------- Encounter Discharge Information Details Patient Name: [name], [name] Date of Service: 08/09/2015 8:00 AM Medical Record Number: [id number] Patient Account Number: [id number] Date of Birth/Sex: Nov 13, 1955 (60 y.o. Female) Treating RN: Ashok Cordia, Debi Primary Care Physician: Fidel Levy Other Clinician: Referring Physician: Fidel Levy Treating Physician/Extender: Rudene Re in Treatment: 62 Encounter Discharge Information Items Discharge Pain Level: 0 Discharge Condition:  Stable Ambulatory Status: Ambulatory Discharge Destination: Home Transportation: Private Auto Accompanied By: self Schedule Follow-up Appointment: Yes Medication Reconciliation completed and provided to Patient/Care Yes Hardin Harden[place]: Provided on Clinical Summary of Care: 08/09/2015 Form Type Recipient Paper Patient PM Electronic Signature(s) Signed: 08/09/2015 9:20:02 AM By: Gwenlyn Perking Entered By: Gwenlyn Perking on 08/09/2015 09:20:02 Tal, [name] ([id number]) -------------------------------------------------------------------------------- Lower Extremity Assessment Details Patient Name: [name], [name] Date of Service: 08/09/2015 8:00 AM Medical Record Number: [id number] Patient Account Number: [id number] Date of Birth/Sex: 06/22/1956 (60 y.o. Female) Treating RN: Ashok Cordia, Debi Primary Care Physician: Fidel Levy Other Clinician: Referring Physician: Fidel Levy Treating Physician/Extender: Rudene Re in Treatment: 62 Edema Assessment Assessed: [Left: No] [Right: No] E[Left: dema] [Right: :] Calf Left: Right: Point of Measurement: 34 cm From Medial Instep 45 cm 42.5 cm Ankle Left: Right: Point of Measurement: 10 cm From Medial Instep 26 cm 24.5 cm Vascular Assessment Pulses: Posterior Tibial Dorsalis Pedis Palpable: [Left:Yes] [Right:Yes] Extremity colors, hair growth, and conditions: Extremity Color: [Left:Red] [Right:Red] Hair Growth on Extremity: [Left:No] [Right:No] Temperature of Extremity: [Left:Warm] [Right:Warm] Capillary Refill: [Left:< 3 seconds] [Right:< 3 seconds] Toe Nail Assessment Left: Right: Thick: No No Discolored: No Deformed: No No Improper Length and Hygiene: No Electronic Signature(s) Signed: 08/09/2015 5:42:28 PM By: Alejandro Mulling Entered By: Alejandro Mulling on 08/09/2015 08:15:31 Gradilla, [name] ([id number]) -------------------------------------------------------------------------------- Multi Wound  Chart Details Patient Name: [name], [name] Date of Service: 08/09/2015 8:00 AM Medical Record Number: [id number] Patient Account Number: [id number] Date of Birth/Sex: 1956-02-11 (60 y.o. Female) Treating RN: Ashok Cordia, Debi Primary Care Physician: Fidel Levy Other Clinician: Referring Physician: Fidel Levy Treating Physician/Extender: Rudene Re in Treatment: 62 Vital Signs Height(in): 68 Pulse(bpm): 85 Weight(lbs): 294 Blood Pressure 159/68 (mmHg): Body Mass Index(BMI): 45 Temperature(F): 98.2 Respiratory Rate 20 (breaths/min): Photos: [1:No Photos] [3:No Photos] [8:No Photos] Wound Location: [1:Right Lower Leg - Anterior Left Lower Leg - Lateral] [8:Right Lower Leg - Posterior] Wounding Event: [1:Gradually Appeared] [3:Gradually Appeared] [8:Gradually Appeared] Primary Etiology: [1:Venous Leg Ulcer] [3:Venous Leg  Ulcer] [8:Venous Leg Ulcer] Comorbid History: [1:Hypertension, Type II Diabetes] [3:Hypertension, Type II Diabetes] [8:Hypertension, Type II Diabetes] Date Acquired: [1:01/09/2015] [3:01/09/2015] [8:03/27/2015] Weeks of Treatment: [1:27] [3:27] [8:18] Wound Status: [1:Open] [3:Open] [8:Open] Measurements L x W x D 9.5x20.5x0.3 [3:13.5x7.5x0.4] [8:6.2x7x0.3] (cm) Area (cm) : [1:152.956] [3:79.522] [8:34.086] Volume (cm) : [1:45.887] [3:31.809] [8:10.226] % Reduction in Area: [1:-325338.30%] [3:-623.20%] [8:-1829.00%] % Reduction in Volume: -917640.00% [3:-2791.70%] [8:-5677.40%] Classification: [1:Full Thickness Without Exposed Support Structures] [3:Full Thickness Without Exposed Support Structures] [8:Full Thickness Without Exposed Support Structures] HBO Classification: [1:Grade 2] [3:Grade 1] [8:Grade 1] Exudate Amount: [1:Large] [3:Large] [8:Large] Exudate Type: [1:Serous] [3:Serous] [8:Serous] Exudate Color: [1:amber] [3:amber] [8:amber] Wound Margin: [1:Thickened] [3:Flat and Intact] [8:Flat and Intact] Granulation Amount:  [1:Medium (34-66%)] [3:Medium (34-66%)] [8:Medium (34-66%)] Granulation Quality: [1:Pink] [3:Red] [8:Red, Hyper-granulation] Necrotic Amount: [1:Medium (34-66%)] [3:Medium (34-66%)] [8:Medium (34-66%)] Necrotic Tissue: [1:Eschar, Adherent Slough] [3:Adherent Slough] [8:Eschar, Adherent Slough] Exposed Structures: [1:Fascia: No Fat: No] [3:Fascia: No Fat: No] [8:Fascia: No Fat: No] Tendon: No Tendon: No Tendon: No Muscle: No Muscle: No Muscle: No Joint: No Joint: No Joint: No Bone: No Bone: No Bone: No Limited to Skin Limited to Skin Limited to Skin Breakdown Breakdown Breakdown Epithelialization: Small (1-33%) None None Periwound Skin Texture: Scarring: Yes Scarring: Yes Edema: Yes Edema: No Edema: No Excoriation: No Excoriation: No Excoriation: No Induration: No Induration: No Induration: No Callus: No Callus: No Callus: No Crepitus: No Crepitus: No Crepitus: No Fluctuance: No Fluctuance: No Fluctuance: No Friable: No Friable: No Friable: No Rash: No Rash: No Rash: No Scarring: No Periwound Skin Moist: Yes Moist: Yes Moist: Yes Moisture: Maceration: No Maceration: No Maceration: No Dry/Scaly: No Dry/Scaly: No Dry/Scaly: No Periwound Skin Color: Atrophie Blanche: No Atrophie Blanche: No Atrophie Blanche: No Cyanosis: No Cyanosis: No Cyanosis: No Ecchymosis: No Ecchymosis: No Ecchymosis: No Erythema: No Erythema: No Erythema: No Hemosiderin Staining: No Hemosiderin Staining: No Hemosiderin Staining: No Mottled: No Mottled: No Mottled: No Pallor: No Pallor: No Pallor: No Rubor: No Rubor: No Rubor: No Temperature: No Abnormality No Abnormality No Abnormality Tenderness on Yes Yes Yes Palpation: Wound Preparation: Ulcer Cleansing: Other: Ulcer Cleansing: Other: Ulcer Cleansing: Other: soap and water soap and water soap and water Topical Anesthetic Topical Anesthetic Topical Anesthetic Applied: Other: lidocaine Applied: Other: lidocaine  Applied: Other: lidocaine 4% 4% 4% Treatment Notes Electronic Signature(s) Signed: 08/09/2015 5:42:28 PM By: Alejandro Mulling Entered By: Alejandro Mulling on 08/09/2015 08:37:07 Markert, [name] ([id number]) -------------------------------------------------------------------------------- Multi-Disciplinary Care Plan Details Patient Name: [name], [name] Date of Service: 08/09/2015 8:00 AM Medical Record Number: [id number] Patient Account Number: [id number] Date of Birth/Sex: February 11, 1956 (59 y.o. Female) Treating RN: Ashok Cordia, Debi Primary Care Physician: Fidel Levy Other Clinician: Referring Physician: Fidel Levy Treating Physician/Extender: Rudene Re in Treatment: 19 Active Inactive Orientation to the Wound Care Program Nursing Diagnoses: Knowledge deficit related to the wound healing center program Goals: Patient/caregiver will verbalize understanding of the Wound Healing Center Program Date Initiated: 01/30/2015 Goal Status: Active Interventions: Provide education on orientation to the wound center Notes: Pain, Acute or Chronic Nursing Diagnoses: Pain, acute or chronic: actual or potential Goals: Patient will verbalize adequate pain control and receive pain control interventions during procedures as needed Date Initiated: 01/30/2015 Goal Status: Active Patient/caregiver will verbalize adequate pain control between visits Date Initiated: 01/30/2015 Goal Status: Active Interventions: Assess comfort goal upon admission Encourage patient to take pain medications as prescribed Notes: Venous Leg Ulcer Nursing Diagnoses: [name], [name] ([id number]) Potential for venous Insuffiency (use before diagnosis  confirmed) Goals: Non-invasive venous studies are completed as ordered Date Initiated: 01/30/2015 Goal Status: Active Interventions: Assess peripheral edema status every visit. Notes: Wound/Skin Impairment Nursing Diagnoses: Impaired  tissue integrity Goals: Ulcer/skin breakdown will have a volume reduction of 30% by week 4 Date Initiated: 01/30/2015 Goal Status: Active Ulcer/skin breakdown will have a volume reduction of 50% by week 8 Date Initiated: 01/30/2015 Goal Status: Active Ulcer/skin breakdown will have a volume reduction of 80% by week 12 Date Initiated: 01/30/2015 Goal Status: Active Ulcer/skin breakdown will heal within 14 weeks Date Initiated: 01/30/2015 Goal Status: Active Interventions: Assess ulceration(s) every visit Notes: Electronic Signature(s) Signed: 08/09/2015 5:42:28 PM By: Alejandro Mulling Entered By: Alejandro Mulling on 08/09/2015 08:36:42 [name], [name] ([id number]) -------------------------------------------------------------------------------- Pain Assessment Details Patient Name: [name], [name] Date of Service: 08/09/2015 8:00 AM Medical Record Number: [id number] Patient Account Number: [id number] Date of Birth/Sex: [birth year] (59 y.o. Female) Treating RN: Ashok Cordia, Debi Primary Care Physician: Fidel Levy Other Clinician: Referring Physician: Fidel Levy Treating Physician/Extender: Rudene Re in Treatment: 63 Active Problems Location of Pain Severity and Description of Pain Patient Has Paino No Site Locations Pain Management and Medication Current Pain Management: Electronic Signature(s) Signed: 08/09/2015 5:42:28 PM By: Alejandro Mulling Entered By: Alejandro Mulling on 08/09/2015 08:03:49 [name], [name] ([id number]) -------------------------------------------------------------------------------- Patient/Caregiver Education Details Patient Name: Pettigrew, Selisa H. Date of Service: 08/09/2015 8:00 AM Medical Record Number: [id number] Patient Account Number: [id number] Date of Birth/Gender: 04-Jun-1956 (59 y.o. Female) Treating RN: Ashok Cordia, Debi Primary Care Physician: Fidel Levy Other Clinician: Referring Physician: Fidel Levy Treating Physician/Extender: Rudene Re in Treatment: 70 Education Assessment Education Provided To: Patient Education Topics Provided Wound/Skin Impairment: Handouts: Other: do not get wraps wet Methods: Demonstration, Explain/Verbal Responses: State content correctly Electronic Signature(s) Signed: 08/09/2015 5:42:28 PM By: Alejandro Mulling Entered By: Alejandro Mulling on 08/09/2015 09:18:07 [name], [name] ([id number]) -------------------------------------------------------------------------------- Wound Assessment Details Patient Name: [name], [name] Date of Service: 08/09/2015 8:00 AM Medical Record Number: [id number] Patient Account Number: [id number] Date of Birth/Sex: [birth year] (59 y.o. Female) Treating RN: Ashok Cordia, Debi Primary Care Physician: Fidel Levy Other Clinician: Referring Physician: Fidel Levy Treating Physician/Extender: Rudene Re in Treatment: 62 Wound Status Wound Number: 1 Primary Etiology: Venous Leg Ulcer Wound Location: Right Lower Leg - Anterior Wound Status: Open Wounding Event: Gradually Appeared Comorbid History: Hypertension, Type II Diabetes Date Acquired: 01/09/2015 Weeks Of Treatment: 27 Clustered Wound: No Photos Photo Uploaded By: Alejandro Mulling on 08/09/2015 17:32:40 Wound Measurements Length: (cm) 9.5 Width: (cm) 20.5 Depth: (cm) 0.3 Area: (cm) 152.956 Volume: (cm) 45.887 % Reduction in Area: -325338.3% % Reduction in Volume: -917640% Epithelialization: Small (1-33%) Tunneling: No Undermining: No Wound Description Full Thickness Without Classification: Exposed Support Structures Diabetic Severity Grade 2 (Wagner): Wound Margin: Thickened Exudate Amount: Large Exudate Type: Serous Exudate Color: amber Foul Odor After Cleansing: No Wound Bed Granulation Amount: Medium (34-66%) Exposed Structure Granulation Quality: Pink Fascia Exposed: No [name], [name]  ([id number]) Necrotic Amount: Medium (34-66%) Fat Layer Exposed: No Necrotic Quality: Eschar, Adherent Slough Tendon Exposed: No Muscle Exposed: No Joint Exposed: No Bone Exposed: No Limited to Skin Breakdown Periwound Skin Texture Texture Color No Abnormalities Noted: No No Abnormalities Noted: No Callus: No Atrophie Blanche: No Crepitus: No Cyanosis: No Excoriation: No Ecchymosis: No Fluctuance: No Erythema: No Friable: No Hemosiderin Staining: No Induration: No Mottled: No Localized Edema: No Pallor: No Rash: No Rubor: No Scarring: Yes Temperature / Pain Moisture Temperature: No Abnormality No Abnormalities  Noted: No Tenderness on Palpation: Yes Dry / Scaly: No Maceration: No Moist: Yes Wound Preparation Ulcer Cleansing: Other: soap and water, Topical Anesthetic Applied: Other: lidocaine 4%, Treatment Notes Wound #1 (Right, Anterior Lower Leg) 1. Cleansed with: Cleanse wound with antibacterial soap and water 2. Anesthetic Topical Lidocaine 4% cream to wound bed prior to debridement 3. Peri-wound Care: Barrier cream 4. Dressing Applied: Aquacel Ag 5. Secondary Dressing Applied ABD Pad 7. Secured with Tape 3 Layer Compression System - Bilateral Notes [name] cream Neyman, Kelina H. ([id number]) Electronic Signature(s) Signed: 08/09/2015 5:42:28 PM By: Alejandro Mulling Entered By: Alejandro Mulling on 08/09/2015 08:23:31 Bondy, Rylen H. ([id number]) -------------------------------------------------------------------------------- Wound Assessment Details Patient Name: Ziebarth, Iniya H. Date of Service: 08/09/2015 8:00 AM Medical Record Number: [id number] Patient Account Number: [id number] Date of Birth/Sex: 1955-11-22 (59 y.o. Female) Treating RN: Ashok Cordia, Debi Primary Care Physician: Fidel Levy Other Clinician: Referring Physician: Fidel Levy Treating Physician/Extender: Rudene Re in Treatment: 62 Wound  Status Wound Number: 3 Primary Etiology: Venous Leg Ulcer Wound Location: Left Lower Leg - Lateral Wound Status: Open Wounding Event: Gradually Appeared Comorbid History: Hypertension, Type II Diabetes Date Acquired: 01/09/2015 Weeks Of Treatment: 27 Clustered Wound: No Photos Photo Uploaded By: Alejandro Mulling on 08/09/2015 17:33:09 Wound Measurements Length: (cm) 13.5 Width: (cm) 7.5 Depth: (cm) 0.4 Area: (cm) 79.522 Volume: (cm) 31.809 % Reduction in Area: -623.2% % Reduction in Volume: -2791.7% Epithelialization: None Tunneling: No Undermining: No Wound Description Full Thickness Without Classification: Exposed Support Structures Diabetic Severity Grade 1 (Wagner): Wound Margin: Flat and Intact Exudate Amount: Large Exudate Type: Serous Exudate Color: amber Foul Odor After Cleansing: No Wound Bed Granulation Amount: Medium (34-66%) Exposed Structure Granulation Quality: Red Fascia Exposed: No Servais, Prisila H. ([id number]) Necrotic Amount: Medium (34-66%) Fat Layer Exposed: No Necrotic Quality: Adherent Slough Tendon Exposed: No Muscle Exposed: No Joint Exposed: No Bone Exposed: No Limited to Skin Breakdown Periwound Skin Texture Texture Color No Abnormalities Noted: No No Abnormalities Noted: No Callus: No Atrophie Blanche: No Crepitus: No Cyanosis: No Excoriation: No Ecchymosis: No Fluctuance: No Erythema: No Friable: No Hemosiderin Staining: No Induration: No Mottled: No Localized Edema: No Pallor: No Rash: No Rubor: No Scarring: Yes Temperature / Pain Moisture Temperature: No Abnormality No Abnormalities Noted: No Tenderness on Palpation: Yes Dry / Scaly: No Maceration: No Moist: Yes Wound Preparation Ulcer Cleansing: Other: soap and water, Topical Anesthetic Applied: Other: lidocaine 4%, Treatment Notes Wound #3 (Left, Lateral Lower Leg) 1. Cleansed with: Cleanse wound with antibacterial soap and water 2.  Anesthetic Topical Lidocaine 4% cream to wound bed prior to debridement 3. Peri-wound Care: Barrier cream 4. Dressing Applied: Aquacel Ag 5. Secondary Dressing Applied ABD Pad 7. Secured with Tape 3 Layer Compression System - Bilateral Notes [name] cream Usrey, Jezebelle H. ([id number]) Electronic Signature(s) Signed: 08/09/2015 5:42:28 PM By: Alejandro Mulling Entered By: Alejandro Mulling on 08/09/2015 08:24:52 Reddy, Jacquelynn H. ([id number]) -------------------------------------------------------------------------------- Wound Assessment Details Patient Name: Goins, Jasmeet H. Date of Service: 08/09/2015 8:00 AM Medical Record Number: [id number] Patient Account Number: [id number] Date of Birth/Sex: February 16, 1956 (59 y.o. Female) Treating RN: Ashok Cordia, Debi Primary Care Physician: Fidel Levy Other Clinician: Referring Physician: Fidel Levy Treating Physician/Extender: Rudene Re in Treatment: 62 Wound Status Wound Number: 8 Primary Etiology: Venous Leg Ulcer Wound Location: Right Lower Leg - Posterior Wound Status: Open Wounding Event: Gradually Appeared Comorbid History: Hypertension, Type II Diabetes Date Acquired: 03/27/2015 Weeks Of Treatment: 18 Clustered Wound: No Photos Photo Uploaded By: Alejandro Mulling  on 08/09/2015 17:33:09 Wound Measurements Length: (cm) 6.2 Width: (cm) 7 Depth: (cm) 0.3 Area: (cm) 34.086 Volume: (cm) 10.226 % Reduction in Area: -1829% % Reduction in Volume: -5677.4% Epithelialization: None Tunneling: No Undermining: No Wound Description Full Thickness Without Classification: Exposed Support Structures Diabetic Severity Grade 1 (Wagner): Wound Margin: Flat and Intact Exudate Amount: Large Exudate Type: Serous Exudate Color: amber Foul Odor After Cleansing: No Wound Bed Granulation Amount: Medium (34-66%) Exposed Structure Granulation Quality: Red, Hyper-granulation Fascia Exposed: No Tse,  Laquanta H. ([id number]) Necrotic Amount: Medium (34-66%) Fat Layer Exposed: No Necrotic Quality: Eschar, Adherent Slough Tendon Exposed: No Muscle Exposed: No Joint Exposed: No Bone Exposed: No Limited to Skin Breakdown Periwound Skin Texture Texture Color No Abnormalities Noted: No No Abnormalities Noted: No Callus: No Atrophie Blanche: No Crepitus: No Cyanosis: No Excoriation: No Ecchymosis: No Fluctuance: No Erythema: No Friable: No Hemosiderin Staining: No Induration: No Mottled: No Localized Edema: Yes Pallor: No Rash: No Rubor: No Scarring: No Temperature / Pain Moisture Temperature: No Abnormality No Abnormalities Noted: No Tenderness on Palpation: Yes Dry / Scaly: No Maceration: No Moist: Yes Wound Preparation Ulcer Cleansing: Other: soap and water, Topical Anesthetic Applied: Other: lidocaine 4%, Treatment Notes Wound #8 (Right, Posterior Lower Leg) 1. Cleansed with: Cleanse wound with antibacterial soap and water 2. Anesthetic Topical Lidocaine 4% cream to wound bed prior to debridement 3. Peri-wound Care: Barrier cream 4. Dressing Applied: Aquacel Ag 5. Secondary Dressing Applied ABD Pad 7. Secured with Tape 3 Layer Compression System - Bilateral Notes [name] cream Risden, Geena H. ([id number]) Electronic Signature(s) Signed: 08/09/2015 5:42:28 PM By: Alejandro Mulling Entered By: Alejandro Mulling on 08/09/2015 08:18:07 Hodes, Selby H. ([id number]) -------------------------------------------------------------------------------- Vitals Details Patient Name: Goonan, Tanasia H. Date of Service: 08/09/2015 8:00 AM Medical Record Number: [id number] Patient Account Number: [id number] Date of Birth/Sex: 1956-05-27 (59 y.o. Female) Treating RN: Ashok Cordia, Debi Primary Care Physician: Fidel Levy Other Clinician: Referring Physician: Fidel Levy Treating Physician/Extender: Rudene Re in Treatment: 62 Vital  Signs Time Taken: 08:03 Temperature (F): 98.2 Height (in): 68 Pulse (bpm): 85 Weight (lbs): 294 Respiratory Rate (breaths/min): 20 Body Mass Index (BMI): 44.7 Blood Pressure (mmHg): 159/68 Reference Range: 80 - 120 mg / dl Electronic Signature(s) Signed: 08/09/2015 5:42:28 PM By: Alejandro Mulling Entered By: Alejandro Mulling on 08/09/2015 08:05:45

## 2015-08-15 DIAGNOSIS — Z992 Dependence on renal dialysis: Secondary | ICD-10-CM | POA: Diagnosis not present

## 2015-08-15 DIAGNOSIS — Z79899 Other long term (current) drug therapy: Secondary | ICD-10-CM | POA: Diagnosis not present

## 2015-08-15 DIAGNOSIS — E119 Type 2 diabetes mellitus without complications: Secondary | ICD-10-CM | POA: Insufficient documentation

## 2015-08-15 DIAGNOSIS — N186 End stage renal disease: Secondary | ICD-10-CM | POA: Diagnosis not present

## 2015-08-15 DIAGNOSIS — E1122 Type 2 diabetes mellitus with diabetic chronic kidney disease: Secondary | ICD-10-CM | POA: Diagnosis not present

## 2015-08-15 DIAGNOSIS — Z794 Long term (current) use of insulin: Secondary | ICD-10-CM | POA: Diagnosis not present

## 2015-08-16 ENCOUNTER — Encounter: Payer: BC Managed Care – PPO | Attending: Internal Medicine | Admitting: Internal Medicine

## 2015-08-16 DIAGNOSIS — R600 Localized edema: Secondary | ICD-10-CM | POA: Insufficient documentation

## 2015-08-16 DIAGNOSIS — I890 Lymphedema, not elsewhere classified: Secondary | ICD-10-CM | POA: Insufficient documentation

## 2015-08-16 DIAGNOSIS — E1122 Type 2 diabetes mellitus with diabetic chronic kidney disease: Secondary | ICD-10-CM | POA: Insufficient documentation

## 2015-08-16 DIAGNOSIS — L97222 Non-pressure chronic ulcer of left calf with fat layer exposed: Secondary | ICD-10-CM | POA: Diagnosis not present

## 2015-08-16 DIAGNOSIS — L97212 Non-pressure chronic ulcer of right calf with fat layer exposed: Secondary | ICD-10-CM | POA: Diagnosis not present

## 2015-08-16 DIAGNOSIS — L97811 Non-pressure chronic ulcer of other part of right lower leg limited to breakdown of skin: Secondary | ICD-10-CM | POA: Diagnosis not present

## 2015-08-16 DIAGNOSIS — I120 Hypertensive chronic kidney disease with stage 5 chronic kidney disease or end stage renal disease: Secondary | ICD-10-CM | POA: Diagnosis not present

## 2015-08-16 DIAGNOSIS — N186 End stage renal disease: Secondary | ICD-10-CM | POA: Insufficient documentation

## 2015-08-16 DIAGNOSIS — L97821 Non-pressure chronic ulcer of other part of left lower leg limited to breakdown of skin: Secondary | ICD-10-CM | POA: Diagnosis not present

## 2015-08-16 DIAGNOSIS — E11622 Type 2 diabetes mellitus with other skin ulcer: Secondary | ICD-10-CM | POA: Insufficient documentation

## 2015-08-16 DIAGNOSIS — Z992 Dependence on renal dialysis: Secondary | ICD-10-CM | POA: Diagnosis not present

## 2015-08-16 DIAGNOSIS — I87313 Chronic venous hypertension (idiopathic) with ulcer of bilateral lower extremity: Secondary | ICD-10-CM | POA: Diagnosis not present

## 2015-08-17 NOTE — Progress Notes (Signed)
CHEYANE, AYON ([id number]) Visit Report for 08/16/2015 Arrival Information Details Patient Name: [name], [name]. Date of Service: 08/16/2015 8:00 AM Medical Record Patient Account Number: [id number] Number: Treating RN: Phillis Haggis 1955/12/30 (59 y.o. Other Clinician: Date of Birth/Sex: Female) Treating ROBSON, MICHAEL Primary Care Physician/Extender: Comer Locket Physician: Referring Physician: Jones Broom in Treatment: 28 Visit Information History Since Last Visit All ordered tests and consults were completed: No Patient Arrived: Ambulatory Added or deleted any medications: No Arrival Time: 08:04 Any new allergies or adverse reactions: No Accompanied By: self Had a fall or experienced change in No Transfer Assistance: None activities of daily living that may affect Patient Identification Verified: Yes risk of falls: Secondary Verification Process Yes Signs or symptoms of abuse/neglect since last No Completed: visito Patient Requires Transmission- No Hospitalized since last visit: No Based Precautions: Pain Present Now: No Patient Has Alerts: Yes Patient Alerts: DMII ABI Faison Bilateral Electronic Signature(s) Signed: 08/16/2015 5:03:09 PM By: Alejandro Mulling Entered By: Alejandro Mulling on 08/16/2015 08:04:46 [name], [name] ([id number]) -------------------------------------------------------------------------------- Encounter Discharge Information Details Patient Name: [name], [name] Date of Service: 08/16/2015 8:00 AM Medical Record Patient Account Number: [id number] Number: Treating RN: Phillis Haggis [birth year] (59 y.o. Other Clinician: Date of Birth/Sex: Female) Treating ROBSON, MICHAEL Primary Care Physician/Extender: Comer Locket Physician: Referring Physician: Jones Broom in Treatment: 56 Encounter Discharge Information Items Discharge Pain Level: 0 Discharge Condition:  Stable Ambulatory Status: Ambulatory Discharge Destination: Home Transportation: Private Auto Accompanied By: self Schedule Follow-up Appointment: Yes Medication Reconciliation completed and provided to Patient/Care Yes Agatha Duplechain: Provided on Clinical Summary of Care: 09/13/2015 Form Type Recipient Paper Patient PM Electronic Signature(s) Signed: 08/16/2015 9:06:57 AM By: Gwenlyn Perking Entered By: Gwenlyn Perking on 08/16/2015 09:06:57 [name], [name] ([id number]) -------------------------------------------------------------------------------- Lower Extremity Assessment Details Patient Name: [name], [name] Date of Service: 08/16/2015 8:00 AM Medical Record Patient Account Number: [id number] Number: Treating RN: Phillis Haggis [birth year] (59 y.o. Other Clinician: Date of Birth/Sex: Female) Treating ROBSON, MICHAEL Primary Care Physician/Extender: Comer Locket Physician: Referring Physician: Jones Broom in Treatment: 28 Edema Assessment Assessed: [Left: No] [Right: No] E[Left: dema] [Right: :] Calf Left: Right: Point of Measurement: cm From Medial Instep 46.5 cm 41.5 cm Ankle Left: Right: Point of Measurement: cm From Medial Instep 25.5 cm 23.6 cm Vascular Assessment Pulses: Posterior Tibial Dorsalis Pedis Palpable: [Left:Yes] [Right:Yes] Extremity colors, hair growth, and conditions: Extremity Color: [Left:Red] [Right:Red] Temperature of Extremity: [Left:Warm] [Right:Warm] Capillary Refill: [Left:< 3 seconds] [Right:< 3 seconds] Toe Nail Assessment Left: Right: Thick: No No Discolored: No No Deformed: No No Improper Length and Hygiene: No No Electronic Signature(s) Signed: 08/16/2015 5:03:09 PM By: Alejandro Mulling Entered By: Alejandro Mulling on 08/16/2015 08:16:12 [name], [name] ([id number]) [name], [name] ([id number]) -------------------------------------------------------------------------------- Multi Wound Chart  Details Patient Name: [name], [name] Date of Service: 08/16/2015 8:00 AM Medical Record Patient Account Number: [id number] Number: Treating RN: Phillis Haggis [birth year] (59 y.o. Other Clinician: Date of Birth/Sex: Female) Treating ROBSON, MICHAEL Primary Care Physician/Extender: Comer Locket Physician: Referring Physician: Jones Broom in Treatment: 28 Vital Signs Height(in): 68 Pulse(bpm): 89 Weight(lbs): 294 Blood Pressure 147/78 (mmHg): Body Mass Index(BMI): 45 Temperature(F): 97.5 Respiratory Rate 20 (breaths/min): Photos: [1:No Photos] [3:No Photos] [8:No Photos] Wound Location: [1:Right Lower Leg - Anterior Left Lower Leg - Lateral] [8:Right Lower Leg - Posterior] Wounding Event: [1:Gradually Appeared] [3:Gradually Appeared] [8:Gradually Appeared] Primary Etiology: [1:Venous Leg Ulcer] [  3:Venous Leg Ulcer] [8:Venous Leg Ulcer] Comorbid History: [1:Hypertension, Type II Diabetes] [3:Hypertension, Type II Diabetes] [8:Hypertension, Type II Diabetes] Date Acquired: [1:01/09/2015] [3:01/09/2015] [8:03/27/2015] Weeks of Treatment: [1:28] [3:28] [8:19] Wound Status: [1:Open] [3:Open] [8:Open] Measurements L x W x D 9.2x2.7x0.3 [3:13.5x7x0.4] [8:9x6x0.3] (cm) Area (cm) : [1:19.509] [3:74.22] [8:42.412] Volume (cm) : [1:5.853] [3:29.688] [8:12.723] % Reduction in Area: [1:-41408.50%] [3:-575.00%] [8:-2300.20%] % Reduction in Volume: -116960.00% [3:-2598.90%] [8:-7088.10%] Classification: [1:Full Thickness Without Exposed Support Structures] [3:Full Thickness Without Exposed Support Structures] [8:Full Thickness Without Exposed Support Structures] HBO Classification: [1:Grade 2] [3:Grade 1] [8:Grade 1] Exudate Amount: [1:Large] [3:Large] [8:Large] Exudate Type: [1:Serous] [3:Serous] [8:Serous] Exudate Color: [1:amber] [3:amber] [8:amber] Wound Margin: [1:Thickened] [3:Flat and Intact] [8:Flat and Intact] Granulation Amount: [1:Medium  (34-66%)] [3:Small (1-33%)] [8:Medium (34-66%)] Granulation Quality: [1:Pink] [3:Red] [8:Red, Hyper-granulation] Necrotic Amount: [1:Medium (34-66%)] [3:Large (67-100%)] [8:Medium (34-66%)] Necrotic Tissue: Eschar, Adherent Slough Adherent [name], Adherent Slough Exposed Structures: Fascia: No Fascia: No Fascia: No Fat: No Fat: No Fat: No Tendon: No Tendon: No Tendon: No Muscle: No Muscle: No Muscle: No Joint: No Joint: No Joint: No Bone: No Bone: No Bone: No Limited to Skin Limited to Skin Limited to Skin Breakdown Breakdown Breakdown Epithelialization: Small (1-33%) None None Periwound Skin Texture: Scarring: Yes Scarring: Yes Edema: Yes Edema: No Edema: No Excoriation: No Excoriation: No Excoriation: No Induration: No Induration: No Induration: No Callus: No Callus: No Callus: No Crepitus: No Crepitus: No Crepitus: No Fluctuance: No Fluctuance: No Fluctuance: No Friable: No Friable: No Friable: No Rash: No Rash: No Rash: No Scarring: No Periwound Skin Moist: Yes Moist: Yes Moist: Yes Moisture: Maceration: No Maceration: No Maceration: No Dry/Scaly: No Dry/Scaly: No Dry/Scaly: No Periwound Skin Color: Atrophie Blanche: No Atrophie Blanche: No Atrophie Blanche: No Cyanosis: No Cyanosis: No Cyanosis: No Ecchymosis: No Ecchymosis: No Ecchymosis: No Erythema: No Erythema: No Erythema: No Hemosiderin Staining: No Hemosiderin Staining: No Hemosiderin Staining: No Mottled: No Mottled: No Mottled: No Pallor: No Pallor: No Pallor: No Rubor: No Rubor: No Rubor: No Temperature: No Abnormality No Abnormality No Abnormality Tenderness on Yes Yes Yes Palpation: Wound Preparation: Ulcer Cleansing: Other: Ulcer Cleansing: Other: Ulcer Cleansing: Other: soap and water soap and water soap and water Topical Anesthetic Topical Anesthetic Topical Anesthetic Applied: Other: lidocaine Applied: Other: lidocaine Applied: Other: lidocaine 4%  4% 4% Treatment Notes Electronic Signature(s) Signed: 08/16/2015 5:03:09 PM By: Alejandro Mulling Entered By: Alejandro Mulling on 08/16/2015 08:35:58 [name], [name] ([id number]) -------------------------------------------------------------------------------- Multi-Disciplinary Care Plan Details Patient Name: Groom, Caleyah H. Date of Service: 08/16/2015 8:00 AM Medical Record Patient Account Number: [id number] Number: Treating RN: Phillis Haggis [birth year] (59 y.o. Other Clinician: Date of Birth/Sex: Female) Treating ROBSON, MICHAEL Primary Care Physician/Extender: Comer Locket Physician: Referring Physician: Jones Broom in Treatment: 93 Active Inactive Orientation to the Wound Care Program Nursing Diagnoses: Knowledge deficit related to the wound healing center program Goals: Patient/caregiver will verbalize understanding of the Wound Healing Center Program Date Initiated: 01/30/2015 Goal Status: Active Interventions: Provide education on orientation to the wound center Notes: Pain, Acute or Chronic Nursing Diagnoses: Pain, acute or chronic: actual or potential Goals: Patient will verbalize adequate pain control and receive pain control interventions during procedures as needed Date Initiated: 01/30/2015 Goal Status: Active Patient/caregiver will verbalize adequate pain control between visits Date Initiated: 01/30/2015 Goal Status: Active Interventions: Assess comfort goal upon admission Encourage patient to take pain medications as prescribed Notes: [name], [name] ([id number]) Venous Leg Ulcer Nursing Diagnoses: Potential for venous Insuffiency (  use before diagnosis confirmed) Goals: Non-invasive venous studies are completed as ordered Date Initiated: 01/30/2015 Goal Status: Active Interventions: Assess peripheral edema status every visit. Notes: Wound/Skin Impairment Nursing Diagnoses: Impaired tissue  integrity Goals: Ulcer/skin breakdown will have a volume reduction of 30% by week 4 Date Initiated: 01/30/2015 Goal Status: Active Ulcer/skin breakdown will have a volume reduction of 50% by week 8 Date Initiated: 01/30/2015 Goal Status: Active Ulcer/skin breakdown will have a volume reduction of 80% by week 12 Date Initiated: 01/30/2015 Goal Status: Active Ulcer/skin breakdown will heal within 14 weeks Date Initiated: 01/30/2015 Goal Status: Active Interventions: Assess ulceration(s) every visit Notes: Electronic Signature(s) Signed: 08/16/2015 5:03:09 PM By: Alejandro Mulling Entered By: Alejandro Mulling on 08/16/2015 14:46:02 [name], [name] ([id number]) -------------------------------------------------------------------------------- Pain Assessment Details Patient Name: [name], Julliette H. Date of Service: 08/16/2015 8:00 AM Medical Record Patient Account Number: [id number] Number: Treating RN: Phillis Haggis 1956/01/13 (59 y.o. Other Clinician: Date of Birth/Sex: Female) Treating ROBSON, MICHAEL Primary Care Physician/Extender: Comer Locket Physician: Referring Physician: Jones Broom in Treatment: 28 Active Problems Location of Pain Severity and Description of Pain Patient Has Paino No Site Locations Pain Management and Medication Current Pain Management: Electronic Signature(s) Signed: 08/16/2015 5:03:09 PM By: Alejandro Mulling Entered By: Alejandro Mulling on 08/16/2015 08:04:54 [name], [name] ([id number]) -------------------------------------------------------------------------------- Patient/Caregiver Education Details Patient Name: Kovacich, Medha H. Date of Service: 08/16/2015 8:00 AM Medical Record Patient Account Number: [id number] Number: Treating RN: Phillis Haggis 20-Jan-1956 (59 y.o. Other Clinician: Date of Birth/Gender: Female) Treating ROBSON, MICHAEL Primary Care Physician/Extender: Comer Locket Physician: Weeks in Treatment: 28 Referring Physician: Fidel Levy Education Assessment Education Provided To: Patient Education Topics Provided Wound/Skin Impairment: Handouts: Other: do not get your wraps wet Methods: Demonstration, Explain/Verbal Responses: State content correctly Electronic Signature(s) Signed: 08/16/2015 5:03:09 PM By: Alejandro Mulling Entered By: Alejandro Mulling on 08/16/2015 09:03:09 Curley, Rebecah H. ([id number]) -------------------------------------------------------------------------------- Wound Assessment Details Patient Name: Muro, Marchia H. Date of Service: 08/16/2015 8:00 AM Medical Record Patient Account Number: [id number] Number: Treating RN: Phillis Haggis 14-Jan-1956 (59 y.o. Other Clinician: Date of Birth/Sex: Female) Treating ROBSON, MICHAEL Primary Care Physician/Extender: Comer Locket Physician: Referring Physician: Fidel Levy Weeks in Treatment: 28 Wound Status Wound Number: 1 Primary Etiology: Venous Leg Ulcer Wound Location: Right Lower Leg - Anterior Wound Status: Open Wounding Event: Gradually Appeared Comorbid History: Hypertension, Type II Diabetes Date Acquired: 01/09/2015 Weeks Of Treatment: 28 Clustered Wound: No Photos Photo Uploaded By: Alejandro Mulling on 08/16/2015 11:30:10 Wound Measurements Length: (cm) 9.2 Width: (cm) 2.7 Depth: (cm) 0.3 Area: (cm) 19.509 Volume: (cm) 5.853 % Reduction in Area: -41408.5% % Reduction in Volume: -116960% Epithelialization: Small (1-33%) Tunneling: No Undermining: No Wound Description Full Thickness Without Foul Odor Afte Classification: Exposed Support Structures Diabetic Severity Grade 2 (Wagner): Wound Margin: Thickened Exudate Amount: Large Exudate Type: Serous Exudate Color: amber Towers, Aleighna H. ([id number]) r Cleansing: No Wound Bed Granulation Amount: Medium (34-66%) Exposed Structure Granulation Quality:  Pink Fascia Exposed: No Necrotic Amount: Medium (34-66%) Fat Layer Exposed: No Necrotic Quality: Eschar, Adherent Slough Tendon Exposed: No Muscle Exposed: No Joint Exposed: No Bone Exposed: No Limited to Skin Breakdown Periwound Skin Texture Texture Color No Abnormalities Noted: No No Abnormalities Noted: No Callus: No Atrophie Blanche: No Crepitus: No Cyanosis: No Excoriation: No Ecchymosis: No Fluctuance: No Erythema: No Friable: No Hemosiderin Staining: No Induration: No Mottled: No Localized Edema: No Pallor: No Rash: No Rubor: No Scarring: Yes Temperature /  Pain Moisture Temperature: No Abnormality No Abnormalities Noted: No Tenderness on Palpation: Yes Dry / Scaly: No Maceration: No Moist: Yes Wound Preparation Ulcer Cleansing: Other: soap and water, Topical Anesthetic Applied: Other: lidocaine 4%, Treatment Notes Wound #1 (Right, Anterior Lower Leg) 1. Cleansed with: Cleanse wound with antibacterial soap and water 2. Anesthetic Topical Lidocaine 4% cream to wound bed prior to debridement 3. Peri-wound Care: Barrier cream 4. Dressing Applied: Aquacel Ag 5. Secondary Dressing Applied ABD Pad 7. Secured with Tape Tigges, Mykenzi H. ([id number]) 3 Layer Compression System - Bilateral Notes nystatin cream Electronic Signature(s) Signed: 08/16/2015 5:03:09 PM By: Alejandro Mulling Entered By: Alejandro Mulling on 08/16/2015 08:30:08 Kross, Kirin H. ([id number]) -------------------------------------------------------------------------------- Wound Assessment Details Patient Name: Malacara, Charniece H. Date of Service: 08/16/2015 8:00 AM Medical Record Patient Account Number: [id number] Number: Treating RN: Phillis Haggis 11-14-1955 (59 y.o. Other Clinician: Date of Birth/Sex: Female) Treating ROBSON, MICHAEL Primary Care Physician/Extender: Comer Locket Physician: Referring Physician: Fidel Levy Weeks in Treatment:  28 Wound Status Wound Number: 3 Primary Etiology: Venous Leg Ulcer Wound Location: Left Lower Leg - Lateral Wound Status: Open Wounding Event: Gradually Appeared Comorbid History: Hypertension, Type II Diabetes Date Acquired: 01/09/2015 Weeks Of Treatment: 28 Clustered Wound: No Photos Photo Uploaded By: Alejandro Mulling on 08/16/2015 11:30:10 Wound Measurements Length: (cm) 13.5 Width: (cm) 7 Depth: (cm) 0.4 Area: (cm) 74.22 Volume: (cm) 29.688 % Reduction in Area: -575% % Reduction in Volume: -2598.9% Epithelialization: None Tunneling: No Undermining: No Wound Description Full Thickness Without Classification: Exposed Support Structures Diabetic Severity Grade 1 (Wagner): Wound Margin: Flat and Intact Exudate Amount: Large Exudate Type: Serous Exudate Color: amber Petrik, Enza H. ([id number]) Foul Odor After Cleansing: No Wound Bed Granulation Amount: Small (1-33%) Exposed Structure Granulation Quality: Red Fascia Exposed: No Necrotic Amount: Large (67-100%) Fat Layer Exposed: No Necrotic Quality: Adherent Slough Tendon Exposed: No Muscle Exposed: No Joint Exposed: No Bone Exposed: No Limited to Skin Breakdown Periwound Skin Texture Texture Color No Abnormalities Noted: No No Abnormalities Noted: No Callus: No Atrophie Blanche: No Crepitus: No Cyanosis: No Excoriation: No Ecchymosis: No Fluctuance: No Erythema: No Friable: No Hemosiderin Staining: No Induration: No Mottled: No Localized Edema: No Pallor: No Rash: No Rubor: No Scarring: Yes Temperature / Pain Moisture Temperature: No Abnormality No Abnormalities Noted: No Tenderness on Palpation: Yes Dry / Scaly: No Maceration: No Moist: Yes Wound Preparation Ulcer Cleansing: Other: soap and water, Topical Anesthetic Applied: Other: lidocaine 4%, Treatment Notes Wound #3 (Left, Lateral Lower Leg) 1. Cleansed with: Cleanse wound with antibacterial soap and water 2.  Anesthetic Topical Lidocaine 4% cream to wound bed prior to debridement 3. Peri-wound Care: Barrier cream 4. Dressing Applied: Aquacel Ag 5. Secondary Dressing Applied ABD Pad 7. Secured with Tape Belling, Drusilla H. ([id number]) 3 Layer Compression System - Bilateral Notes nystatin cream Electronic Signature(s) Signed: 08/16/2015 5:03:09 PM By: Alejandro Mulling Entered By: Alejandro Mulling on 08/16/2015 08:26:49 Stanis, Arijana H. ([id number]) -------------------------------------------------------------------------------- Wound Assessment Details Patient Name: Umholtz, Zahria H. Date of Service: 08/16/2015 8:00 AM Medical Record Patient Account Number: [id number] Number: Treating RN: Phillis Haggis [birth year] (59 y.o. Other Clinician: Date of Birth/Sex: Female) Treating ROBSON, MICHAEL Primary Care Physician/Extender: Comer Locket Physician: Referring Physician: Fidel Levy Weeks in Treatment: 28 Wound Status Wound Number: 8 Primary Etiology: Venous Leg Ulcer Wound Location: Right Lower Leg - Posterior Wound Status: Open Wounding Event: Gradually Appeared Comorbid History: Hypertension, Type II Diabetes Date Acquired: 03/27/2015 Weeks Of Treatment: 19  Clustered Wound: No Photos Photo Uploaded By: Alejandro Mulling on 08/16/2015 11:30:25 Wound Measurements Length: (cm) 9 Width: (cm) 6 Depth: (cm) 0.3 Area: (cm) 42.412 Volume: (cm) 12.723 % Reduction in Area: -2300.2% % Reduction in Volume: -7088.1% Epithelialization: None Tunneling: No Undermining: No Wound Description Full Thickness Without Classification: Exposed Support Structures Diabetic Severity Grade 1 (Wagner): Wound Margin: Flat and Intact Exudate Amount: Large Exudate Type: Serous Exudate Color: amber Sarra, Glenisha H. ([id number]) Foul Odor After Cleansing: No Wound Bed Granulation Amount: Medium (34-66%) Exposed Structure Granulation Quality: Red,  Hyper-granulation Fascia Exposed: No Necrotic Amount: Medium (34-66%) Fat Layer Exposed: No Necrotic Quality: Eschar, Adherent Slough Tendon Exposed: No Muscle Exposed: No Joint Exposed: No Bone Exposed: No Limited to Skin Breakdown Periwound Skin Texture Texture Color No Abnormalities Noted: No No Abnormalities Noted: No Callus: No Atrophie Blanche: No Crepitus: No Cyanosis: No Excoriation: No Ecchymosis: No Fluctuance: No Erythema: No Friable: No Hemosiderin Staining: No Induration: No Mottled: No Localized Edema: Yes Pallor: No Rash: No Rubor: No Scarring: No Temperature / Pain Moisture Temperature: No Abnormality No Abnormalities Noted: No Tenderness on Palpation: Yes Dry / Scaly: No Maceration: No Moist: Yes Wound Preparation Ulcer Cleansing: Other: soap and water, Topical Anesthetic Applied: Other: lidocaine 4%, Treatment Notes Wound #8 (Right, Posterior Lower Leg) 1. Cleansed with: Cleanse wound with antibacterial soap and water 2. Anesthetic Topical Lidocaine 4% cream to wound bed prior to debridement 3. Peri-wound Care: Barrier cream 4. Dressing Applied: Aquacel Ag 5. Secondary Dressing Applied ABD Pad 7. Secured with Tape Rodriques, Ryane H. ([id number]) 3 Layer Compression System - Bilateral Notes nystatin cream Electronic Signature(s) Signed: 08/16/2015 5:03:09 PM By: Alejandro Mulling Entered By: Alejandro Mulling on 08/16/2015 08:28:59 Madill, Mekiah H. ([id number]) -------------------------------------------------------------------------------- Vitals Details Patient Name: Popwell, Onica H. Date of Service: 08/16/2015 8:00 AM Medical Record Patient Account Number: [id number] Number: Treating RN: Phillis Haggis Dec 19, 1955 (59 y.o. Other Clinician: Date of Birth/Sex: Female) Treating ROBSON, MICHAEL Primary Care Physician/Extender: Comer Locket Physician: Referring Physician: Jones Broom in Treatment:  28 Vital Signs Time Taken: 08:04 Temperature (F): 97.5 Height (in): 68 Pulse (bpm): 89 Weight (lbs): 294 Respiratory Rate (breaths/min): 20 Body Mass Index (BMI): 44.7 Blood Pressure (mmHg): 147/78 Reference Range: 80 - 120 mg / dl Electronic Signature(s) Signed: 08/16/2015 5:03:09 PM By: Alejandro Mulling Entered By: Alejandro Mulling on 08/16/2015 08:07:48

## 2015-08-17 NOTE — Progress Notes (Addendum)
NICLE, CONNOLE ([id number]) Visit Report for 08/16/2015 Chief Complaint Document Details Patient Name: [name], [name]. Date of Service: 08/16/2015 8:00 AM Medical Record Patient Account Number: [id number] Number: Treating RN: Phillis Haggis July 11, 1956 (59 y.o. Other Clinician: Date of Birth/Sex: Female) Treating ROBSON, MICHAEL Primary Care Physician/Extender: Comer Locket Physician: Referring Physician: Jones Broom in Treatment: 28 Information Obtained from: Patient Chief Complaint Chronic bilateral calf ulcers. Electronic Signature(s) Signed: 08/16/2015 4:36:57 PM By: Baltazar Najjar MD Entered By: Baltazar Najjar on 08/16/2015 08:55:33 [name], [name] ([id number]) -------------------------------------------------------------------------------- HPI Details Patient Name: [name], [name] Date of Service: 08/16/2015 8:00 AM Medical Record Patient Account Number: [id number] Number: Treating RN: Phillis Haggis 1955/11/11 (59 y.o. Other Clinician: Date of Birth/Sex: Female) Treating ROBSON, MICHAEL Primary Care Physician/Extender: Comer Locket Physician: Referring Physician: Jones Broom in Treatment: 28 History of Present Illness HPI Description: Pleasant 60 year old with h/o DM (Hgb A1c 7.4 in Aug 2016), ESRD (on hemodialysis since July 2016). Presented to her PCP, Dr. Venora Maples, with BLE ulcers since June 2016. 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 Left lower extremity ultrasound in May 2016 showed no evidence for DVT. No assessment for venous insufficiency. Culture 04/19/2015 grew methicillin sensitive staph aureus (sensitive to tetracycline), Stenotrophomonas maltophilia, and Enterococcus faecalis. Completed course of doxycycline. Hospitalized in Nov 2016 for worsening cellulitis.  Treated with IV antibiotics. No operative debridement or biopsy. Punch biopsy of left calf ulcer 06/28/2015 showed no evidence for malignancy. Bilateral lower extremity venous ultrasound 07/20/2015 showed no evidence for DVT or chronic venous insufficiency. Performing dressing changes with silver alginate. Tolerating Profore light bilaterally 3x/week. She returns to clinic and is without complaints. No significant pain. No fever or chills. Less drainage. 08/09/2015 -- details over the last several months noted. Continues to stay hemodialysis 3 times a week. Says she is improving slowly and the pain is much better 08/16/15; this patient has wounds on her bilateral lower extremities which is been present since June 2016. The exact etiology of this is not really clear although she was hospitalized in October for cellulitis. I'm not sure if this was felt to be bilateral. In any case she had a debridement last week. She is using Aquacel Ag. Per the nursing staff the wounds continue to do nicely Electronic Signature(s) Signed: 08/16/2015 4:36:57 PM By: Baltazar Najjar MD Entered By: Baltazar Najjar on 08/16/2015 08:57:26 [name], [name] ([id number]) -------------------------------------------------------------------------------- Physical Exam Details Patient Name: [name], [name] Date of Service: 08/16/2015 8:00 AM Medical Record Patient Account Number: [id number] Number: Treating RN: Phillis Haggis 1956/01/27 (59 y.o. Other Clinician: Date of Birth/Sex: Female) Treating ROBSON, MICHAEL Primary Care Physician/Extender: Comer Locket Physician: Referring Physician: Fidel Levy Weeks in Treatment: 28 Notes 08/16/15 wounds on her bilateral legs in the mid aspect of. All of the wound base is here appear to be reasonably healthy. I didn't think debridement was required today. She does have a fading fungal infection and we have been putting nystatin cream under the  wrap Electronic Signature(s) Signed: 08/16/2015 4:36:57 PM By: Baltazar Najjar MD Entered By: Baltazar Najjar on 08/16/2015 08:58:23 [name], [name] ([id number]) -------------------------------------------------------------------------------- Physician Orders Details Patient Name: [name], [name] Date of Service: 08/16/2015 8:00 AM Medical Record Patient Account Number: [id number] Number: Treating RN: Phillis Haggis [birth year] (59 y.o. Other Clinician: Date of Birth/Sex: Female) Treating ROBSON, MICHAEL Primary Care  Physician/Extender: Comer Locket Physician: Referring Physician: Jones Broom in Treatment: 46 Verbal / Phone Orders: Yes Clinician: Ashok Cordia, Debi Read Back and Verified: Yes Diagnosis Coding Wound Cleansing Wound #1 Right,Anterior Lower Leg o Clean wound with Normal Saline. o Cleanse wound with mild soap and water o May Shower, gently pat wound dry prior to applying new dressing. Wound #3 Left,Lateral Lower Leg o Clean wound with Normal Saline. o Cleanse wound with mild soap and water o May Shower, gently pat wound dry prior to applying new dressing. Wound #8 Right,Posterior Lower Leg o Clean wound with Normal Saline. o Cleanse wound with mild soap and water o May Shower, gently pat wound dry prior to applying new dressing. Anesthetic Wound #1 Right,Anterior Lower Leg o Topical Lidocaine 4% cream applied to wound bed prior to debridement Wound #3 Left,Lateral Lower Leg o Topical Lidocaine 4% cream applied to wound bed prior to debridement Wound #8 Right,Posterior Lower Leg o Topical Lidocaine 4% cream applied to wound bed prior to debridement Skin Barriers/Peri-Wound Care Wound #1 Right,Anterior Lower Leg o Barrier cream Wound #3 Left,Lateral Lower Leg o Barrier cream Wound #8 Right,Posterior Lower Leg [name], [name] ([id number]) o Barrier cream Primary Wound Dressing Wound #1  Right,Anterior Lower Leg o Aquacel Ag - or equivalent (No foam) Wound #3 Left,Lateral Lower Leg o Aquacel Ag - or equivalent (No foam) Wound #8 Right,Posterior Lower Leg o Aquacel Ag - or equivalent (No foam) Secondary Dressing Wound #1 Right,Anterior Lower Leg o ABD pad - please add ABD pad to dressing Wound #3 Left,Lateral Lower Leg o ABD pad - please add ABD pad to dressing Wound #8 Right,Posterior Lower Leg o ABD pad - please add ABD pad to dressing Dressing Change Frequency Wound #1 Right,Anterior Lower Leg o Change Dressing Monday, Wednesday, Friday - Wednesday in wound care center Wound #3 Left,Lateral Lower Leg o Change Dressing Monday, Wednesday, Friday - Wednesday in wound care center Wound #8 Right,Posterior Lower Leg o Change Dressing Monday, Wednesday, Friday - Wednesday in wound care center Follow-up Appointments Wound #1 Right,Anterior Lower Leg o Return Appointment in 1 week. Wound #3 Left,Lateral Lower Leg o Return Appointment in 1 week. Wound #8 Right,Posterior Lower Leg o Return Appointment in 1 week. Edema Control Wound #1 Right,Anterior Lower Leg o 3 Layer Compression System - Bilateral - Profore lite o Elevate legs to the level of the heart and pump ankles as often as possible - Elevate legs at Dialysis [name], [name] ([id number]) Wound #3 Left,Lateral Lower Leg o 3 Layer Compression System - Bilateral - Profore lite o Elevate legs to the level of the heart and pump ankles as often as possible - Elevate legs at Dialysis Wound #8 Right,Posterior Lower Leg o 3 Layer Compression System - Bilateral - Profore lite o Elevate legs to the level of the heart and pump ankles as often as possible - Elevate legs at Dialysis Off-Loading Wound #1 Right,Anterior Lower Leg o Turn and reposition every 2 hours Wound #3 Left,Lateral Lower Leg o Turn and reposition every 2 hours Wound #8 Right,Posterior Lower Leg o Turn  and reposition every 2 hours Additional Orders / Instructions Wound #1 Right,Anterior Lower Leg o Activity as tolerated Wound #3 Left,Lateral Lower Leg o Activity as tolerated Wound #8 Right,Posterior Lower Leg o Activity as tolerated Home Health Wound #1 Right,Anterior Lower Leg o Continue Home Health Visits - Advanced: Monday, Wednesday and Friday **WRAP GOES FROM BASE OF TOES TO THREE FINGER WIDTHS BENEATH BEND  IN KNEE. o Home Health Nurse may visit PRN to address patientos wound care needs. o FACE TO FACE ENCOUNTER: MEDICARE and MEDICAID PATIENTS: I certify that this patient is under my care and that I had a face-to-face encounter that meets the physician face-to-face encounter requirements with this patient on this date. The encounter with the patient was in whole or in part for the following MEDICAL CONDITION: (primary reason for Home Healthcare) MEDICAL NECESSITY: I certify, that based on my findings, NURSING services are a medically necessary home health service. HOME BOUND STATUS: I certify that my clinical findings support that this patient is homebound (i.e., Due to illness or injury, pt requires aid of supportive devices such as crutches, cane, wheelchairs, walkers, the use of special transportation or the assistance of another person to leave their place of residence. There is a normal inability to leave the home and doing so requires considerable and taxing effort. Other absences are for medical reasons / religious services and are infrequent or of short duration when for other reasons). [name], [name] ([id number]) o If current dressing causes regression in wound condition, may D/C ordered dressing product/s and apply Normal Saline Moist Dressing daily until next Wound Healing Center / Other MD appointment. Notify Wound Healing Center of regression in wound condition at [phone]. o Please direct any NON-WOUND related issues/requests for orders to  patient's Primary Care Physician Wound #3 Left,Lateral Lower Leg o Continue Home Health Visits - Advanced: Monday, Wednesday and Friday **WRAP GOES FROM BASE OF TOES TO THREE FINGER WIDTHS BENEATH BEND IN KNEE. o Home Health Nurse may visit PRN to address patientos wound care needs. o FACE TO FACE ENCOUNTER: MEDICARE and MEDICAID PATIENTS: I certify that this patient is under my care and that I had a face-to-face encounter that meets the physician face-to-face encounter requirements with this patient on this date. The encounter with the patient was in whole or in part for the following MEDICAL CONDITION: (primary reason for Home Healthcare) MEDICAL NECESSITY: I certify, that based on my findings, NURSING services are a medically necessary home health service. HOME BOUND STATUS: I certify that my clinical findings support that this patient is homebound (i.e., Due to illness or injury, pt requires aid of supportive devices such as crutches, cane, wheelchairs, walkers, the use of special transportation or the assistance of another person to leave their place of residence. There is a normal inability to leave the home and doing so requires considerable and taxing effort. Other absences are for medical reasons / religious services and are infrequent or of short duration when for other reasons). o If current dressing causes regression in wound condition, may D/C ordered dressing product/s and apply Normal Saline Moist Dressing daily until next Wound Healing Center / Other MD appointment. Notify Wound Healing Center of regression in wound condition at [phone]. o Please direct any NON-WOUND related issues/requests for orders to patient's Primary Care Physician Wound #8 Right,Posterior Lower Leg o Continue Home Health Visits - Advanced: Monday, Wednesday and Friday **WRAP GOES FROM BASE OF TOES TO THREE FINGER WIDTHS BENEATH BEND IN KNEE. o Home Health Nurse may visit PRN to  address patientos wound care needs. o FACE TO FACE ENCOUNTER: MEDICARE and MEDICAID PATIENTS: I certify that this patient is under my care and that I had a face-to-face encounter that meets the physician face-to-face encounter requirements with this patient on this date. The encounter with the patient was in whole or in part for the following MEDICAL CONDITION: (primary  reason for Home Healthcare) MEDICAL NECESSITY: I certify, that based on my findings, NURSING services are a medically necessary home health service. HOME BOUND STATUS: I certify that my clinical findings support that this patient is homebound (i.e., Due to illness or injury, pt requires aid of supportive devices such as crutches, cane, wheelchairs, walkers, the use of special transportation or the assistance of another person to leave their place of residence. There is a normal inability to leave the home and doing so requires considerable and taxing effort. Other absences are for medical reasons / religious services and are infrequent or of short duration when for other reasons). o If current dressing causes regression in wound condition, may D/C ordered dressing product/s and apply Normal Saline Moist Dressing daily until next Wound Healing Center / Other MD appointment. Notify Wound Healing Center of regression in wound condition at [phone]. o Please direct any NON-WOUND related issues/requests for orders to patient's Primary Care Physician [name], [name] ([id number]) Medications-please add to medication list. Wound #1 Right,Anterior Lower Leg o Other: - Antifungal Cream to bilateral lower legs, NOT ON WOUNDS Electronic Signature(s) Signed: 08/16/2015 4:36:57 PM By: Baltazar Najjar MD Signed: 08/16/2015 5:03:09 PM By: Alejandro Mulling Entered By: Alejandro Mulling on 08/16/2015 08:41:01 [name], [name] ([id number]) -------------------------------------------------------------------------------- Problem  List Details Patient Name: [name], [name] Date of Service: 08/16/2015 8:00 AM Medical Record Patient Account Number: [id number] Number: Treating RN: Phillis Haggis January 22, 1956 (59 y.o. 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 Date of Service: 08/16/2015 8:00 AM Medical Record Patient Account Number: [id number] Number: Treating RN: Phillis Haggis September 17, 1955 (59 y.o. Other Clinician: Date of Birth/Sex: Female) Treating ROBSON, MICHAEL Primary Care Physician/Extender: Comer Locket Physician: Referring Physician: Jones Broom in Treatment: 28 Subjective Chief Complaint Information obtained from Patient Chronic bilateral calf ulcers. History of Present Illness (HPI) Pleasant 60 year old with h/o DM (Hgb A1c 7.4 in Aug 2016), ESRD (on hemodialysis since July 2016). Presented to her PCP, Dr. Venora Maples, with BLE ulcers since June 2016. Arterial ultrasound in August 2016 showed no significant peripheral  arterial disease on the right and mild tibioperoneal atherosclerotic disease on the left. Left lower extremity ultrasound in May 2016 showed no evidence for DVT. No assessment for venous insufficiency. Culture 04/19/2015 grew methicillin sensitive staph aureus (sensitive to tetracycline), Stenotrophomonas maltophilia, and Enterococcus faecalis. Completed course of doxycycline. Hospitalized in Nov 2016 for worsening cellulitis. Treated with IV antibiotics. No operative debridement or biopsy. Punch biopsy of left calf ulcer 06/28/2015 showed no evidence for malignancy. Bilateral lower extremity venous ultrasound 07/20/2015 showed no evidence for DVT or chronic venous insufficiency. Performing dressing changes with silver alginate. Tolerating Profore light bilaterally 3x/week. She returns to clinic and is without complaints. No significant pain. No fever or chills. Less drainage. 08/09/2015 -- details  over the last several months noted. Continues to stay hemodialysis 3 times a week. Says she is improving slowly and the pain is much better 08/16/15; this patient has wounds on her bilateral lower extremities which is been present since June 2016. The exact etiology of this is not really clear although she was hospitalized in October for cellulitis. I'm not sure if this was felt to be bilateral. In any case she had a debridement last week. She is using Aquacel Ag. Per the nursing staff the wounds continue to do nicely Objective Shedrick, Nevaen H. ([id number]) Constitutional Vitals Time Taken: 8:04 AM, Height: 68 in, Weight: 294 lbs, BMI: 44.7, Temperature: 97.5 F, Pulse: 89 bpm, Respiratory Rate: 20 breaths/min, Blood Pressure: 147/78 mmHg. Integumentary (Hair, Skin) Wound #1 status is Open. Original cause of wound was Gradually Appeared. The wound is located on the Right,Anterior Lower Leg. The wound measures 9.2cm length x 2.7cm width x 0.3cm depth; 19.509cm^2 area and 5.853cm^3 volume. The  wound is limited to skin breakdown. There is no tunneling or undermining noted. There is a large amount of serous drainage noted. The wound margin is thickened. There is medium (34-66%) pink granulation within the wound bed. There is a medium (34-66%) amount of necrotic tissue within the wound bed including Eschar and Adherent Slough. The periwound skin appearance exhibited: Scarring, Moist. The periwound skin appearance did not exhibit: Callus, Crepitus, Excoriation, Fluctuance, Friable, Induration, Localized Edema, Rash, Dry/Scaly, Maceration, Atrophie Blanche, Cyanosis, Ecchymosis, Hemosiderin Staining, Mottled, Pallor, Rubor, Erythema. Periwound temperature was noted as No Abnormality. The periwound has tenderness on palpation. Wound #3 status is Open. Original cause of wound was Gradually Appeared. The wound is located on the Left,Lateral Lower Leg. The wound measures 13.5cm length x 7cm width x 0.4cm depth; 74.22cm^2 area and 29.688cm^3 volume. The wound is limited to skin breakdown. There is no tunneling or undermining noted. There is a large amount of serous drainage noted. The wound margin is flat and intact. There is small (1-33%) red granulation within the wound bed. There is a large (67-100%) amount of necrotic tissue within the wound bed including Adherent Slough. The periwound skin appearance exhibited: Scarring, Moist. The periwound skin appearance did not exhibit: Callus, Crepitus, Excoriation, Fluctuance, Friable, Induration, Localized Edema, Rash, Dry/Scaly, Maceration, Atrophie Blanche, Cyanosis, Ecchymosis, Hemosiderin Staining, Mottled, Pallor, Rubor, Erythema. Periwound temperature was noted as No Abnormality. The periwound has tenderness on palpation. Wound #8 status is Open. Original cause of wound was Gradually Appeared. The wound is located on the Right,Posterior Lower Leg. The wound measures 9cm length x 6cm width x 0.3cm depth; 42.412cm^2 area and 12.723cm^3 volume.  The wound is limited to skin breakdown. There is no tunneling or undermining noted. There is a large amount of serous drainage noted. The wound margin is flat and intact. There is medium (34-66%) red granulation within the wound bed. There is a medium (34-66%) amount of necrotic tissue within the wound bed including Eschar and Adherent Slough. The periwound skin appearance exhibited: Localized Edema, Moist. The periwound skin appearance did not exhibit: Callus, Crepitus, Excoriation, Fluctuance, Friable, Induration, Rash, Scarring, Dry/Scaly, Maceration, Atrophie Blanche, Cyanosis, Ecchymosis, Hemosiderin Staining, Mottled, Pallor, Rubor, Erythema. Periwound temperature was noted as No Abnormality. The periwound has tenderness on palpation. Assessment Active Problems ICD-10 E11.622 - Type 2 diabetes mellitus with other skin ulcer N18.6 - End stage renal disease [name], [name] ([id number]) N82.956 - Non-pressure chronic ulcer of left calf with fat layer exposed L97.212 - Non-pressure chronic ulcer of right calf  with fat layer exposed R60.0 - Localized edema I89.0 - Lymphedema, not elsewhere classified Plan Wound Cleansing: Wound #1 Right,Anterior Lower Leg: Clean wound with Normal Saline. Cleanse wound with mild soap and water May Shower, gently pat wound dry prior to applying new dressing. Wound #3 Left,Lateral Lower Leg: Clean wound with Normal Saline. Cleanse wound with mild soap and water May Shower, gently pat wound dry prior to applying new dressing. Wound #8 Right,Posterior Lower Leg: Clean wound with Normal Saline. Cleanse wound with mild soap and water May Shower, gently pat wound dry prior to applying new dressing. Anesthetic: Wound #1 Right,Anterior Lower Leg: Topical Lidocaine 4% cream applied to wound bed prior to debridement Wound #3 Left,Lateral Lower Leg: Topical Lidocaine 4% cream applied to wound bed prior to debridement Wound #8 Right,Posterior Lower  Leg: Topical Lidocaine 4% cream applied to wound bed prior to debridement Skin Barriers/Peri-Wound Care: Wound #1 Right,Anterior Lower Leg: Barrier cream Wound #3 Left,Lateral Lower Leg: Barrier cream Wound #8 Right,Posterior Lower Leg: Barrier cream Primary Wound Dressing: Wound #1 Right,Anterior Lower Leg: Aquacel Ag - or equivalent (No foam) Wound #3 Left,Lateral Lower Leg: Aquacel Ag - or equivalent (No foam) Wound #8 Right,Posterior Lower Leg: Aquacel Ag - or equivalent (No foam) Secondary Dressing: Wound #1 Right,Anterior Lower Leg: ABD pad - please add ABD pad to dressing Wound #3 Left,Lateral Lower Leg: Giebel, Melora H. ([id number]) ABD pad - please add ABD pad to dressing Wound #8 Right,Posterior Lower Leg: ABD pad - please add ABD pad to dressing Dressing Change Frequency: Wound #1 Right,Anterior Lower Leg: Change Dressing Monday, Wednesday, Friday - Wednesday in wound care center Wound #3 Left,Lateral Lower Leg: Change Dressing Monday, Wednesday, Friday - Wednesday in wound care center Wound #8 Right,Posterior Lower Leg: Change Dressing Monday, Wednesday, Friday - Wednesday in wound care center Follow-up Appointments: Wound #1 Right,Anterior Lower Leg: Return Appointment in 1 week. Wound #3 Left,Lateral Lower Leg: Return Appointment in 1 week. Wound #8 Right,Posterior Lower Leg: Return Appointment in 1 week. Edema Control: Wound #1 Right,Anterior Lower Leg: 3 Layer Compression System - Bilateral - Profore lite Elevate legs to the level of the heart and pump ankles as often as possible - Elevate legs at Dialysis Wound #3 Left,Lateral Lower Leg: 3 Layer Compression System - Bilateral - Profore lite Elevate legs to the level of the heart and pump ankles as often as possible - Elevate legs at Dialysis Wound #8 Right,Posterior Lower Leg: 3 Layer Compression System - Bilateral - Profore lite Elevate legs to the level of the heart and pump ankles as often as  possible - Elevate legs at Dialysis Off-Loading: Wound #1 Right,Anterior Lower Leg: Turn and reposition every 2 hours Wound #3 Left,Lateral Lower Leg: Turn and reposition every 2 hours Wound #8 Right,Posterior Lower Leg: Turn and reposition every 2 hours Additional Orders / Instructions: Wound #1 Right,Anterior Lower Leg: Activity as tolerated Wound #3 Left,Lateral Lower Leg: Activity as tolerated Wound #8 Right,Posterior Lower Leg: Activity as tolerated Home Health: Wound #1 Right,Anterior Lower Leg: Continue Home Health Visits - Advanced: Monday, Wednesday and Friday **WRAP GOES FROM BASE OF TOES TO THREE FINGER WIDTHS BENEATH BEND IN KNEE. Home Health Nurse may visit PRN to address patient s wound care needs. FACE TO FACE ENCOUNTER: MEDICARE and MEDICAID PATIENTS: I certify that this patient is under my care and that I had a face-to-face encounter that meets the physician face-to-face encounter requirements with this patient on this date. The encounter with the patient was in  whole or in part for the following MEDICAL CONDITION: (primary reason for Home Healthcare) MEDICAL NECESSITY: I certify, that based on my findings, NURSING services are a medically necessary home health service. HOME Strother, Israel H. ([id number]) BOUND STATUS: I certify that my clinical findings support that this patient is homebound (i.e., Due to illness or injury, pt requires aid of supportive devices such as crutches, cane, wheelchairs, walkers, the use of special transportation or the assistance of another person to leave their place of residence. There is a normal inability to leave the home and doing so requires considerable and taxing effort. Other absences are for medical reasons / religious services and are infrequent or of short duration when for other reasons). If current dressing causes regression in wound condition, may D/C ordered dressing product/s and apply Normal Saline Moist Dressing daily  until next Wound Healing Center / Other MD appointment. Notify Wound Healing Center of regression in wound condition at [phone]. Please direct any NON-WOUND related issues/requests for orders to patient's Primary Care Physician Wound #3 Left,Lateral Lower Leg: Continue Home Health Visits - [facility]: Monday, Wednesday and Friday **WRAP GOES FROM BASE OF TOES TO THREE FINGER WIDTHS BENEATH BEND IN KNEE. Home Health Nurse may visit PRN to address patient s wound care needs. FACE TO FACE ENCOUNTER: MEDICARE and MEDICAID PATIENTS: I certify that this patient is under my care and that I had a face-to-face encounter that meets the physician face-to-face encounter requirements with this patient on this date. The encounter with the patient was in whole or in part for the following MEDICAL CONDITION: (primary reason for Home Healthcare) MEDICAL NECESSITY: I certify, that based on my findings, NURSING services are a medically necessary home health service. HOME BOUND STATUS: I certify that my clinical findings support that this patient is homebound (i.e., Due to illness or injury, pt requires aid of supportive devices such as crutches, cane, wheelchairs, walkers, the use of special transportation or the assistance of another person to leave their place of residence. There is a normal inability to leave the home and doing so requires considerable and taxing effort. Other absences are for medical reasons / religious services and are infrequent or of short duration when for other reasons). If current dressing causes regression in wound condition, may D/C ordered dressing product/s and apply Normal Saline Moist Dressing daily until next Wound Healing Center / Other MD appointment. Notify Wound Healing Center of regression in wound condition at [phone]. Please direct any NON-WOUND related issues/requests for orders to patient's Primary Care Physician Wound #8 Right,Posterior Lower Leg: Continue Home  Health Visits - [facility]: Monday, Wednesday and Friday **WRAP GOES FROM BASE OF TOES TO THREE FINGER WIDTHS BENEATH BEND IN KNEE. Home Health Nurse may visit PRN to address patient s wound care needs. FACE TO FACE ENCOUNTER: MEDICARE and MEDICAID PATIENTS: I certify that this patient is under my care and that I had a face-to-face encounter that meets the physician face-to-face encounter requirements with this patient on this date. The encounter with the patient was in whole or in part for the following MEDICAL CONDITION: (primary reason for Home Healthcare) MEDICAL NECESSITY: I certify, that based on my findings, NURSING services are a medically necessary home health service. HOME BOUND STATUS: I certify that my clinical findings support that this patient is homebound (i.e., Due to illness or injury, pt requires aid of supportive devices such as crutches, cane, wheelchairs, walkers, the use of special transportation or the assistance of another person to leave  their place of residence. There is a normal inability to leave the home and doing so requires considerable and taxing effort. Other absences are for medical reasons / religious services and are infrequent or of short duration when for other reasons). If current dressing causes regression in wound condition, may D/C ordered dressing product/s and apply Normal Saline Moist Dressing daily until next Wound Healing Center / Other MD appointment. Notify Wound Healing Center of regression in wound condition at [phone]. Please direct any NON-WOUND related issues/requests for orders to patient's Primary Care Physician Medications-please add to medication list.: Wound #1 Right,Anterior Lower Leg: Other: - Antifungal Cream to bilateral lower legs, NOT ON WOUNDS Dapolito, Haruye H. ([id number]) #1 will continue with aquacel AG based dressings under a profore litewrap. #2 no debridement was required. Further debridement may be necessary. #3  nystatin cream to the erythematous areas which appear to be improving. #4 the exact etiology of these wounds is unclear presumably related to infection initially. Her situation appears to be gradually improving Electronic Signature(s) Signed: 08/22/2015 3:38:56 PM By: Baltazar Najjar MD Previous Signature: 08/16/2015 4:36:57 PM Version By: Baltazar Najjar MD Entered By: Baltazar Najjar on 08/22/2015 08:46:45 Turnbo, [name] ([id number]) -------------------------------------------------------------------------------- SuperBill Details Patient Name: Vandeusen, [name] Date of Service: 08/16/2015 Medical Record Patient Account Number: [id number] Number: Treating RN: Phillis Haggis 1956/07/13 (59 y.o. 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 Physician Procedures CPT4 Code: [id number] Description: [phone] - WC PHYS LEVEL 2 - EST PT ICD-10 Description Diagnosis L97.222 Non-pressure chronic ulcer of left calf with fat Modifier: layer exposed Quantity: 1 Electronic Signature(s) Signed: 08/16/2015 4:36:57 PM By: Baltazar Najjar MD Signed: 08/16/2015 5:03:09 PM By: Alejandro Mulling Entered By: Alejandro Mulling on 08/16/2015 11:49:07

## 2015-08-22 ENCOUNTER — Encounter: Payer: BC Managed Care – PPO | Admitting: Internal Medicine

## 2015-08-22 DIAGNOSIS — E11622 Type 2 diabetes mellitus with other skin ulcer: Secondary | ICD-10-CM | POA: Diagnosis not present

## 2015-08-22 DIAGNOSIS — L97821 Non-pressure chronic ulcer of other part of left lower leg limited to breakdown of skin: Secondary | ICD-10-CM | POA: Diagnosis not present

## 2015-08-22 DIAGNOSIS — I87313 Chronic venous hypertension (idiopathic) with ulcer of bilateral lower extremity: Secondary | ICD-10-CM | POA: Diagnosis not present

## 2015-08-22 DIAGNOSIS — L97811 Non-pressure chronic ulcer of other part of right lower leg limited to breakdown of skin: Secondary | ICD-10-CM | POA: Diagnosis not present

## 2015-08-25 NOTE — Progress Notes (Signed)
MAHAM, QUINTIN ([id number]) Visit Report for 08/22/2015 Arrival Information Details Patient Name: [name], [name]. Date of Service: 08/22/2015 8:00 AM Medical Record Patient Account Number: [id number] Number: Treating RN: Phillis Haggis [birth year] (60 y.o. Other Clinician: Date of Birth/Sex: Female) Treating ROBSON, MICHAEL Primary Care Physician/Extender: Comer Locket Physician: Referring Physician: Jones Broom in Treatment: 31 Visit Information History Since Last Visit All ordered tests and consults were completed: No Patient Arrived: Ambulatory Added or deleted any medications: No Arrival Time: 08:07 Any new allergies or adverse reactions: No Accompanied By: self Had a fall or experienced change in No Transfer Assistance: None activities of daily living that may affect Patient Requires Transmission- No risk of falls: Based Precautions: Signs or symptoms of abuse/neglect since last No Patient Has Alerts: Yes visito Patient Alerts: DMII Hospitalized since last visit: No ABI [place] Pain Present Now: No Bilateral Electronic Signature(s) Signed: 08/24/2015 5:51:12 PM By: Alejandro Mulling Entered By: Alejandro Mulling on 08/22/2015 08:07:39 [name] ([id number]) -------------------------------------------------------------------------------- Encounter Discharge Information Details Patient Name: [name], [name] Date of Service: 08/22/2015 8:00 AM Medical Record Patient Account Number: [id number] Number: Treating RN: Phillis Haggis 1955-09-18 (60 y.o. Other Clinician: Date of Birth/Sex: Female) Treating ROBSON, MICHAEL Primary Care Physician/Extender: Comer Locket Physician: Referring Physician: Jones Broom in Treatment: 37 Encounter Discharge Information Items Discharge Pain Level: 0 Discharge Condition: Stable Ambulatory Status: Ambulatory Discharge Destination: Home Transportation: Private  Auto Accompanied By: self Schedule Follow-up Appointment: Yes Medication Reconciliation completed and provided to Patient/Care Yes [name]: Provided on Clinical Summary of Care: 08/22/2015 Form Type Recipient Paper Patient PM Electronic Signature(s) Signed: 08/22/2015 9:30:22 AM By: Gwenlyn Perking Entered By: Gwenlyn Perking on 08/22/2015 09:30:22 [name] ([id number]) -------------------------------------------------------------------------------- Lower Extremity Assessment Details Patient Name: [name], [name] Date of Service: 08/22/2015 8:00 AM Medical Record Patient Account Number: [id number] Number: Treating RN: Phillis Haggis 1956-04-23 (60 y.o. Other Clinician: Date of Birth/Sex: Female) Treating ROBSON, MICHAEL Primary Care Physician/Extender: Comer Locket Physician: Referring Physician: Jones Broom in Treatment: 29 Edema Assessment Assessed: [Left: No] [Right: No] E[Left: dema] [Right: :] Calf Left: Right: Point of Measurement: 34 cm From Medial Instep 46.2 cm 42.5 cm Ankle Left: Right: Point of Measurement: 10 cm From Medial Instep 25.6 cm 24.4 cm Vascular Assessment Pulses: Posterior Tibial Dorsalis Pedis Palpable: [Left:Yes] [Right:Yes] Extremity colors, hair growth, and conditions: Extremity Color: [Left:Red] [Right:Red] Temperature of Extremity: [Left:Warm] [Right:Warm] Capillary Refill: [Left:< 3 seconds] [Right:< 3 seconds] Toe Nail Assessment Left: Right: Thick: No No Discolored: No No Deformed: No No Improper Length and Hygiene: No No Electronic Signature(s) Signed: 08/24/2015 5:51:12 PM By: Alejandro Mulling Entered By: Alejandro Mulling on 08/22/2015 08:17:36 Dolinger, Lynnlee H. ([id number]) [name], [name] ([id number]) -------------------------------------------------------------------------------- Multi Wound Chart Details Patient Name: [name], [name] Date of Service: 08/22/2015 8:00 AM Medical  Record Patient Account Number: [id number] Number: Treating RN: Phillis Haggis [birth year] (60 y.o. Other Clinician: Date of Birth/Sex: Female) Treating ROBSON, MICHAEL Primary Care Physician/Extender: Comer Locket Physician: Referring Physician: Jones Broom in Treatment: 29 Vital Signs Height(in): 68 Pulse(bpm): 78 Weight(lbs): 294 Blood Pressure 143/69 (mmHg): Body Mass Index(BMI): 45 Temperature(F): 97.6 Respiratory Rate 20 (breaths/min): Photos: [1:No Photos] [3:No Photos] [8:No Photos] Wound Location: [1:Right Lower Leg - Anterior Left Lower Leg - Lateral] [8:Right Lower Leg - Posterior] Wounding Event: [1:Gradually Appeared] [3:Gradually Appeared] [8:Gradually Appeared] Primary Etiology: [1:Venous Leg Ulcer] [3:Venous Leg Ulcer] [8:Venous Leg Ulcer] Comorbid  History: [1:Hypertension, Type II Diabetes] [3:Hypertension, Type II Diabetes] [8:Hypertension, Type II Diabetes] Date Acquired: [1:01/09/2015] [3:01/09/2015] [8:03/27/2015] Weeks of Treatment: [1:29] [3:29] [8:20] Wound Status: [1:Open] [3:Open] [8:Open] Measurements L x W x D 7x1.3x0.2 [3:11x3.5x0.3] [8:6.4x3.5x0.2] (cm) Area (cm) : [1:7.147] [3:30.238] [8:17.593] Volume (cm) : [1:1.429] [3:9.071] [8:3.519] % Reduction in Area: [1:-15106.40%] [3:-175.00%] [8:-895.60%] % Reduction in Volume: -28480.00% [3:-724.60%] [8:-1888.10%] Classification: [1:Full Thickness Without Exposed Support Structures] [3:Full Thickness Without Exposed Support Structures] [8:Full Thickness Without Exposed Support Structures] HBO Classification: [1:Grade 2] [3:Grade 1] [8:Grade 1] Exudate Amount: [1:Large] [3:Large] [8:Large] Exudate Type: [1:Serous] [3:Serous] [8:Serous] Exudate Color: [1:amber] [3:amber] [8:amber] Wound Margin: [1:Thickened] [3:Flat and Intact] [8:Flat and Intact] Granulation Amount: [1:Medium (34-66%)] [3:Small (1-33%)] [8:Medium (34-66%)] Granulation Quality: [1:Pink] [3:Red] [8:Red,  Hyper-granulation] Necrotic Amount: [1:Medium (34-66%)] [3:Large (67-100%)] [8:Medium (34-66%)] Necrotic Tissue: Eschar, Adherent Slough Adherent [name], Adherent Slough Exposed Structures: Fascia: No Fascia: No Fascia: No Fat: No Fat: No Fat: No Tendon: No Tendon: No Tendon: No Muscle: No Muscle: No Muscle: No Joint: No Joint: No Joint: No Bone: No Bone: No Bone: No Limited to Skin Limited to Skin Limited to Skin Breakdown Breakdown Breakdown Epithelialization: Small (1-33%) Small (1-33%) Small (1-33%) Periwound Skin Texture: Scarring: Yes Scarring: Yes Edema: Yes Edema: No Edema: No Excoriation: No Excoriation: No Excoriation: No Induration: No Induration: No Induration: No Callus: No Callus: No Callus: No Crepitus: No Crepitus: No Crepitus: No Fluctuance: No Fluctuance: No Fluctuance: No Friable: No Friable: No Friable: No Rash: No Rash: No Rash: No Scarring: No Periwound Skin Moist: Yes Moist: Yes Moist: Yes Moisture: Maceration: No Maceration: No Maceration: No Dry/Scaly: No Dry/Scaly: No Dry/Scaly: No Periwound Skin Color: Atrophie Blanche: No Atrophie Blanche: No Atrophie Blanche: No Cyanosis: No Cyanosis: No Cyanosis: No Ecchymosis: No Ecchymosis: No Ecchymosis: No Erythema: No Erythema: No Erythema: No Hemosiderin Staining: No Hemosiderin Staining: No Hemosiderin Staining: No Mottled: No Mottled: No Mottled: No Pallor: No Pallor: No Pallor: No Rubor: No Rubor: No Rubor: No Temperature: No Abnormality No Abnormality No Abnormality Tenderness on Yes Yes Yes Palpation: Wound Preparation: Ulcer Cleansing: Other: Ulcer Cleansing: Other: Ulcer Cleansing: Other: soap and water soap and water soap and water Topical Anesthetic Topical Anesthetic Topical Anesthetic Applied: Other: lidocaine Applied: Other: lidocaine Applied: Other: lidocaine 4% 4% 4% Treatment Notes Electronic Signature(s) Signed: 08/24/2015 5:51:12 PM  By: Alejandro Mulling Entered By: Alejandro Mulling on 08/22/2015 08:32:36 Pawelski, [name] ([id number]) -------------------------------------------------------------------------------- Multi-Disciplinary Care Plan Details Patient Name: [name], [name] Date of Service: 08/22/2015 8:00 AM Medical Record Patient Account Number: [id number] Number: Treating RN: Phillis Haggis 11-23-1955 (59 y.o. Other Clinician: Date of Birth/Sex: Female) Treating ROBSON, MICHAEL Primary Care Physician/Extender: Comer Locket Physician: Referring Physician: Jones Broom in Treatment: 43 Active Inactive Orientation to the Wound Care Program Nursing Diagnoses: Knowledge deficit related to the wound healing center program Goals: Patient/caregiver will verbalize understanding of the Wound Healing Center Program Date Initiated: 01/30/2015 Goal Status: Active Interventions: Provide education on orientation to the wound center Notes: Pain, Acute or Chronic Nursing Diagnoses: Pain, acute or chronic: actual or potential Goals: Patient will verbalize adequate pain control and receive pain control interventions during procedures as needed Date Initiated: 01/30/2015 Goal Status: Active Patient/caregiver will verbalize adequate pain control between visits Date Initiated: 01/30/2015 Goal Status: Active Interventions: Assess comfort goal upon admission Encourage patient to take pain medications as prescribed Notes: Rico, Dustina H. ([id number]) Venous Leg Ulcer Nursing Diagnoses: Potential for venous Insuffiency (use before diagnosis confirmed) Goals:  Non-invasive venous studies are completed as ordered Date Initiated: 01/30/2015 Goal Status: Active Interventions: Assess peripheral edema status every visit. Notes: Wound/Skin Impairment Nursing Diagnoses: Impaired tissue integrity Goals: Ulcer/skin breakdown will have a volume reduction of 30% by week 4 Date  Initiated: 01/30/2015 Goal Status: Active Ulcer/skin breakdown will have a volume reduction of 50% by week 8 Date Initiated: 01/30/2015 Goal Status: Active Ulcer/skin breakdown will have a volume reduction of 80% by week 12 Date Initiated: 01/30/2015 Goal Status: Active Ulcer/skin breakdown will heal within 14 weeks Date Initiated: 01/30/2015 Goal Status: Active Interventions: Assess ulceration(s) every visit Notes: Electronic Signature(s) Signed: 08/24/2015 5:51:12 PM By: Alejandro Mulling Entered By: Alejandro Mulling on 08/22/2015 08:32:28 [name], [name] ([id number]) -------------------------------------------------------------------------------- Pain Assessment Details Patient Name: [name], [name] Date of Service: 08/22/2015 8:00 AM Medical Record Patient Account Number: [id number] Number: Treating RN: Phillis Haggis 02-02-1956 (59 y.o. Other Clinician: Date of Birth/Sex: Female) Treating ROBSON, MICHAEL Primary Care Physician/Extender: Comer Locket Physician: Referring Physician: Jones Broom in Treatment: 29 Active Problems Location of Pain Severity and Description of Pain Patient Has Paino No Site Locations Pain Management and Medication Current Pain Management: Electronic Signature(s) Signed: 08/24/2015 5:51:12 PM By: Alejandro Mulling Entered By: Alejandro Mulling on 08/22/2015 08:07:46 [name] ([id number]) -------------------------------------------------------------------------------- Patient/Caregiver Education Details Patient Name: [name], [name] Date of Service: 08/22/2015 8:00 AM Medical Record Patient Account Number: [id number] Number: Treating RN: Phillis Haggis Apr 28, 1956 (59 y.o. Other Clinician: Date of Birth/Gender: Female) Treating ROBSON, MICHAEL Primary Care Physician/Extender: Comer Locket Physician: Weeks in Treatment: 29 Referring Physician: Fidel Levy Education  Assessment Education Provided To: Patient Education Topics Provided Wound/Skin Impairment: Handouts: Other: do not get wraps wet Methods: Demonstration, Explain/Verbal Responses: State content correctly Electronic Signature(s) Signed: 08/24/2015 5:51:12 PM By: Alejandro Mulling Entered By: Alejandro Mulling on 08/22/2015 08:54:03 Dancer, Anjelika H. ([id number]) -------------------------------------------------------------------------------- Wound Assessment Details Patient Name: [name], [name] Date of Service: 08/22/2015 8:00 AM Medical Record Patient Account Number: [id number] Number: Treating RN: Phillis Haggis 13-Jul-1956 (59 y.o. Other Clinician: Date of Birth/Sex: Female) Treating ROBSON, MICHAEL Primary Care Physician/Extender: Comer Locket Physician: Referring Physician: Fidel Levy Weeks in Treatment: 29 Wound Status Wound Number: 1 Primary Etiology: Venous Leg Ulcer Wound Location: Right Lower Leg - Anterior Wound Status: Open Wounding Event: Gradually Appeared Comorbid History: Hypertension, Type II Diabetes Date Acquired: 01/09/2015 Weeks Of Treatment: 29 Clustered Wound: No Photos Photo Uploaded By: Alejandro Mulling on 08/22/2015 11:22:51 Wound Measurements Length: (cm) 7 Width: (cm) 1.3 Depth: (cm) 0.2 Area: (cm) 7.147 Volume: (cm) 1.429 % Reduction in Area: -15106.4% % Reduction in Volume: -28480% Epithelialization: Small (1-33%) Wound Description Full Thickness Without Foul Odor Afte Classification: Exposed Support Structures Diabetic Severity Grade 2 (Wagner): Wound Margin: Thickened Exudate Amount: Large Exudate Type: Serous Exudate Color: amber Boxer, Graham H. ([id number]) r Cleansing: No Wound Bed Granulation Amount: Medium (34-66%) Exposed Structure Granulation Quality: Pink Fascia Exposed: No Necrotic Amount: Medium (34-66%) Fat Layer Exposed: No Necrotic Quality: Eschar, Adherent Slough Tendon  Exposed: No Muscle Exposed: No Joint Exposed: No Bone Exposed: No Limited to Skin Breakdown Periwound Skin Texture Texture Color No Abnormalities Noted: No No Abnormalities Noted: No Callus: No Atrophie Blanche: No Crepitus: No Cyanosis: No Excoriation: No Ecchymosis: No Fluctuance: No Erythema: No Friable: No Hemosiderin Staining: No Induration: No Mottled: No Localized Edema: No Pallor: No Rash: No Rubor: No Scarring: Yes Temperature / Pain Moisture Temperature: No Abnormality No Abnormalities Noted: No  Tenderness on Palpation: Yes Dry / Scaly: No Maceration: No Moist: Yes Wound Preparation Ulcer Cleansing: Other: soap and water, Topical Anesthetic Applied: Other: lidocaine 4%, Treatment Notes Wound #1 (Right, Anterior Lower Leg) 1. Cleansed with: Cleanse wound with antibacterial soap and water 2. Anesthetic Topical Lidocaine 4% cream to wound bed prior to debridement 3. Peri-wound Care: Antifungal cream Barrier cream 4. Dressing Applied: Iodoflex 5. Secondary Dressing Applied ABD Pad Dry Gauze Nie, Tavia H. ([id number]) 7. Secured with Tape 4 Layer Compression System - Bilateral Electronic Signature(s) Signed: 08/24/2015 5:51:12 PM By: Alejandro Mulling Entered By: Alejandro Mulling on 08/22/2015 08:28:43 Keiper, Mame Rexene [name] ([id number]) -------------------------------------------------------------------------------- Wound Assessment Details Patient Name: Diegel, Nikcole H. Date of Service: 08/22/2015 8:00 AM Medical Record Patient Account Number: [id number] Number: Treating RN: Phillis Haggis June 30, 1956 (59 y.o. Other Clinician: Date of Birth/Sex: Female) Treating ROBSON, MICHAEL Primary Care Physician/Extender: Comer Locket Physician: Referring Physician: Fidel Levy Weeks in Treatment: 29 Wound Status Wound Number: 3 Primary Etiology: Venous Leg Ulcer Wound Location: Left Lower Leg - Lateral Wound Status:  Open Wounding Event: Gradually Appeared Comorbid History: Hypertension, Type II Diabetes Date Acquired: 01/09/2015 Weeks Of Treatment: 29 Clustered Wound: No Photos Photo Uploaded By: Alejandro Mulling on 08/22/2015 11:22:52 Wound Measurements Length: (cm) 11 Width: (cm) 3.5 Depth: (cm) 0.3 Area: (cm) 30.238 Volume: (cm) 9.071 % Reduction in Area: -175% % Reduction in Volume: -724.6% Epithelialization: Small (1-33%) Tunneling: No Undermining: No Wound Description Full Thickness Without Foul Odor Afte Classification: Exposed Support Structures Diabetic Severity Grade 1 (Wagner): Wound Margin: Flat and Intact Exudate Amount: Large Exudate Type: Serous Exudate Color: amber Lacour, Antha H. ([id number]) r Cleansing: No Wound Bed Granulation Amount: Small (1-33%) Exposed Structure Granulation Quality: Red Fascia Exposed: No Necrotic Amount: Large (67-100%) Fat Layer Exposed: No Necrotic Quality: Adherent Slough Tendon Exposed: No Muscle Exposed: No Joint Exposed: No Bone Exposed: No Limited to Skin Breakdown Periwound Skin Texture Texture Color No Abnormalities Noted: No No Abnormalities Noted: No Callus: No Atrophie Blanche: No Crepitus: No Cyanosis: No Excoriation: No Ecchymosis: No Fluctuance: No Erythema: No Friable: No Hemosiderin Staining: No Induration: No Mottled: No Localized Edema: No Pallor: No Rash: No Rubor: No Scarring: Yes Temperature / Pain Moisture Temperature: No Abnormality No Abnormalities Noted: No Tenderness on Palpation: Yes Dry / Scaly: No Maceration: No Moist: Yes Wound Preparation Ulcer Cleansing: Other: soap and water, Topical Anesthetic Applied: Other: lidocaine 4%, Treatment Notes Wound #3 (Left, Lateral Lower Leg) 1. Cleansed with: Cleanse wound with antibacterial soap and water 2. Anesthetic Topical Lidocaine 4% cream to wound bed prior to debridement 3. Peri-wound Care: Antifungal cream Barrier  cream 4. Dressing Applied: Iodoflex 5. Secondary Dressing Applied ABD Pad Dry Gauze Schoenberg, Jade H. ([id number]) 7. Secured with Tape 4 Layer Compression System - Bilateral Electronic Signature(s) Signed: 08/24/2015 5:51:12 PM By: Alejandro Mulling Entered By: Alejandro Mulling on 08/22/2015 08:26:14 Branan, Roylene Rexene [name] ([id number]) -------------------------------------------------------------------------------- Wound Assessment Details Patient Name: Ambrose, Jayle H. Date of Service: 08/22/2015 8:00 AM Medical Record Patient Account Number: [id number] Number: Treating RN: Phillis Haggis 1956-07-02 (59 y.o. Other Clinician: Date of Birth/Sex: Female) Treating ROBSON, MICHAEL Primary Care Physician/Extender: Comer Locket Physician: Referring Physician: Fidel Levy Weeks in Treatment: 29 Wound Status Wound Number: 8 Primary Etiology: Venous Leg Ulcer Wound Location: Right Lower Leg - Posterior Wound Status: Open Wounding Event: Gradually Appeared Comorbid History: Hypertension, Type II Diabetes Date Acquired: 03/27/2015 Weeks Of Treatment: 20 Clustered Wound: No Photos Photo Uploaded By:  Alejandro Mulling on 08/22/2015 11:23:13 Wound Measurements Length: (cm) 6.4 Width: (cm) 3.5 Depth: (cm) 0.2 Area: (cm) 17.593 Volume: (cm) 3.519 % Reduction in Area: -895.6% % Reduction in Volume: -1888.1% Epithelialization: Small (1-33%) Tunneling: No Undermining: No Wound Description Full Thickness Without Foul Odor Afte Classification: Exposed Support Structures Diabetic Severity Grade 1 (Wagner): Wound Margin: Flat and Intact Exudate Amount: Large Exudate Type: Serous Exudate Color: amber Scarpati, Mikaella H. ([id number]) r Cleansing: No Wound Bed Granulation Amount: Medium (34-66%) Exposed Structure Granulation Quality: Red, Hyper-granulation Fascia Exposed: No Necrotic Amount: Medium (34-66%) Fat Layer Exposed: No Necrotic Quality:  Eschar, Adherent Slough Tendon Exposed: No Muscle Exposed: No Joint Exposed: No Bone Exposed: No Limited to Skin Breakdown Periwound Skin Texture Texture Color No Abnormalities Noted: No No Abnormalities Noted: No Callus: No Atrophie Blanche: No Crepitus: No Cyanosis: No Excoriation: No Ecchymosis: No Fluctuance: No Erythema: No Friable: No Hemosiderin Staining: No Induration: No Mottled: No Localized Edema: Yes Pallor: No Rash: No Rubor: No Scarring: No Temperature / Pain Moisture Temperature: No Abnormality No Abnormalities Noted: No Tenderness on Palpation: Yes Dry / Scaly: No Maceration: No Moist: Yes Wound Preparation Ulcer Cleansing: Other: soap and water, Topical Anesthetic Applied: Other: lidocaine 4%, Treatment Notes Wound #8 (Right, Posterior Lower Leg) 1. Cleansed with: Cleanse wound with antibacterial soap and water 2. Anesthetic Topical Lidocaine 4% cream to wound bed prior to debridement 3. Peri-wound Care: Antifungal cream Barrier cream 4. Dressing Applied: Iodoflex 5. Secondary Dressing Applied ABD Pad Dry Gauze Hinchliffe, Vung H. ([id number]) 7. Secured with Tape 4 Layer Compression System - Bilateral Electronic Signature(s) Signed: 08/24/2015 5:51:12 PM By: Alejandro Mulling Entered By: Alejandro Mulling on 08/22/2015 08:29:40 Musquiz, Collette Rexene [name] ([id number]) -------------------------------------------------------------------------------- Vitals Details Patient Name: Severin, Chamara H. Date of Service: 08/22/2015 8:00 AM Medical Record Patient Account Number: [id number] Number: Treating RN: Phillis Haggis 11-Sep-1955 (59 y.o. Other Clinician: Date of Birth/Sex: Female) Treating ROBSON, MICHAEL Primary Care Physician/Extender: Comer Locket Physician: Referring Physician: Jones Broom in Treatment: 29 Vital Signs Time Taken: 08:07 Temperature (F): 97.6 Height (in): 68 Pulse (bpm): 78 Weight (lbs):  294 Respiratory Rate (breaths/min): 20 Body Mass Index (BMI): 44.7 Blood Pressure (mmHg): 143/69 Reference Range: 80 - 120 mg / dl Electronic Signature(s) Signed: 08/24/2015 5:51:12 PM By: Alejandro Mulling Entered By: Alejandro Mulling on 08/22/2015 08:09:35

## 2015-08-25 NOTE — Progress Notes (Signed)
TOYE, ROUILLARD ([id number]) Visit Report for 08/22/2015 Chief Complaint Document Details Patient Name: [name], [name]. Date of Service: 08/22/2015 8:00 AM Medical Record Patient Account Number: [id number] Number: Treating RN: Phillis Haggis Dec 16, 1955 (60 y.o. Other Clinician: Date of Birth/Sex: Female) Treating Domonick Sittner Primary Care Physician/Extender: Comer Locket Physician: Referring Physician: Jones Broom in Treatment: 29 Information Obtained from: Patient Chief Complaint Chronic bilateral calf ulcers. Electronic Signature(s) Signed: 08/22/2015 3:38:56 PM By: Baltazar Najjar MD Entered By: Baltazar Najjar on 08/22/2015 09:09:46 Ciszewski, [name] ([id number]) -------------------------------------------------------------------------------- Debridement Details Patient Name: Gilbert, Vilma H. Date of Service: 08/22/2015 8:00 AM Medical Record Patient Account Number: [id number] Number: Treating RN: Phillis Haggis 12/05/1955 (60 y.o. Other Clinician: Date of Birth/Sex: Female) Treating Maxwel Meadowcroft Primary Care Physician/Extender: Comer Locket Physician: Referring Physician: Jones Broom in Treatment: 29 Debridement Performed for Wound #1 Right,Anterior Lower Leg Assessment: Performed By: Physician Maxwell Caul, MD Debridement: Debridement Pre-procedure Yes Verification/Time Out Taken: Start Time: 08:37 Pain Control: Other : lidocaine 4% cream Level: Skin/Subcutaneous Tissue Total Area Debrided (L x 7 (cm) x 1.3 (cm) = 9.1 (cm) W): Tissue and other Viable, Non-Viable, Exudate, Fibrin/Slough, Subcutaneous material debrided: Instrument: Curette Bleeding: Minimum Hemostasis Achieved: Pressure End Time: 08:54 Procedural Pain: 0 Post Procedural Pain: 0 Response to Treatment: Procedure was tolerated well Post Debridement Measurements of Total Wound Length: (cm) 7 Width: (cm)  1.3 Depth: (cm) 0.3 Volume: (cm) 2.144 Post Procedure Diagnosis Same as Pre-procedure Electronic Signature(s) Signed: 08/22/2015 3:38:56 PM By: Baltazar Najjar MD Signed: 08/24/2015 5:51:12 PM By: Alejandro Mulling Entered By: Baltazar Najjar on 08/22/2015 09:08:49 Odem, [name] ([id number]) Ayo, Sakari H. ([id number]) -------------------------------------------------------------------------------- Debridement Details Patient Name: [name], [name] Date of Service: 08/22/2015 8:00 AM Medical Record Patient Account Number: [id number] Number: Treating RN: Phillis Haggis Oct 09, 1955 (60 y.o. Other Clinician: Date of Birth/Sex: Female) Treating Valine Drozdowski Primary Care Physician/Extender: Comer Locket Physician: Referring Physician: Jones Broom in Treatment: 29 Debridement Performed for Wound #3 Left,Lateral Lower Leg Assessment: Performed By: Physician Maxwell Caul, MD Debridement: Debridement Pre-procedure Yes Verification/Time Out Taken: Start Time: 08:37 Pain Control: Other : lidocaine 4% cream Level: Skin/Subcutaneous Tissue Total Area Debrided (L x 11 (cm) x 3.5 (cm) = 38.5 (cm) W): Tissue and other Viable, Non-Viable, Exudate, Fibrin/Slough, Subcutaneous material debrided: Instrument: Curette Bleeding: Minimum Hemostasis Achieved: Pressure End Time: 08:54 Procedural Pain: 0 Post Procedural Pain: 0 Response to Treatment: Procedure was tolerated well Post Debridement Measurements of Total Wound Length: (cm) 11 Width: (cm) 3.5 Depth: (cm) 0.4 Volume: (cm) 12.095 Post Procedure Diagnosis Same as Pre-procedure Electronic Signature(s) Signed: 08/22/2015 3:38:56 PM By: Baltazar Najjar MD Signed: 08/24/2015 5:51:12 PM By: Alejandro Mulling Entered By: Baltazar Najjar on 08/22/2015 09:09:08 Dodds, [name] ([id number]) Cybulski, Zhanna H.  ([id number]) -------------------------------------------------------------------------------- Debridement Details Patient Name: [name], [name] Date of Service: 08/22/2015 8:00 AM Medical Record Patient Account Number: [id number] Number: Treating RN: Phillis Haggis 1956-03-30 (60 y.o. Other Clinician: Date of Birth/Sex: Female) Treating Vestal Markin Primary Care Physician/Extender: Comer Locket Physician: Referring Physician: Jones Broom in Treatment: 29 Debridement Performed for Wound #8 Right,Posterior Lower Leg Assessment: Performed By: Physician Maxwell Caul, MD Debridement: Debridement Pre-procedure Yes Verification/Time Out Taken: Start Time: 08:37 Pain Control: Other : lidocaine 4% cream Level: Skin/Subcutaneous Tissue Total Area Debrided (L x 6.4 (cm) x 3.5 (cm) = 22.4 (cm) W): Tissue and other Viable, Non-Viable, Exudate, Fibrin/Slough, Subcutaneous  material debrided: Instrument: Curette Bleeding: Minimum Hemostasis Achieved: Pressure End Time: 08:54 Procedural Pain: 0 Post Procedural Pain: 0 Response to Treatment: Procedure was tolerated well Post Debridement Measurements of Total Wound Length: (cm) 6.4 Width: (cm) 3.5 Depth: (cm) 0.3 Volume: (cm) 5.278 Post Procedure Diagnosis Same as Pre-procedure Electronic Signature(s) Signed: 08/22/2015 3:38:56 PM By: Baltazar Najjar MD Signed: 08/24/2015 5:51:12 PM By: Alejandro Mulling Entered By: Baltazar Najjar on 08/22/2015 09:09:27 Corvino, [name] ([id number]) Demary, Rasheen H. ([id number]) -------------------------------------------------------------------------------- HPI Details Patient Name: [name], [name] Date of Service: 08/22/2015 8:00 AM Medical Record Patient Account Number: [id number] Number: Treating RN: Phillis Haggis 28-Jan-1956 (60 y.o. Other Clinician: Date of Birth/Sex: Female) Treating Arlando Leisinger Primary Care  Physician/Extender: Comer Locket Physician: Referring Physician: Jones Broom in Treatment: 29 History of Present Illness HPI Description: Pleasant 60 year old with h/o DM (Hgb A1c 7.4 in Aug 2016), ESRD (on hemodialysis since July 2016). Presented to her PCP, Dr. Venora Maples, with BLE ulcers since June 2016. 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 Left lower extremity ultrasound in May 2016 showed no evidence for DVT. No assessment for venous insufficiency. Culture 04/19/2015 grew methicillin sensitive staph aureus (sensitive to tetracycline), Stenotrophomonas maltophilia, and Enterococcus faecalis. Completed course of doxycycline. Hospitalized in Nov 2016 for worsening cellulitis. Treated with IV antibiotics. No operative debridement or biopsy. Punch biopsy of left calf ulcer 06/28/2015 showed no evidence for malignancy. Bilateral lower extremity venous ultrasound 07/20/2015 showed no evidence for DVT or chronic venous insufficiency. Performing dressing changes with silver alginate. Tolerating Profore light bilaterally 3x/week. She returns to clinic and is without complaints. No significant pain. No fever or chills. Less drainage. 08/09/2015 -- details over the last several months noted. Continues to stay hemodialysis 3 times a week. Says she is improving slowly and the pain is much better 08/16/15; this patient has wounds on her bilateral lower extremities which is been present since June 2016. The exact etiology of this is not really clear although she was hospitalized in October for cellulitis. I'm not sure if this was felt to be bilateral. In any case she had a debridement last week. She is using Aquacel Ag. Per the nursing staff the wounds continue to do nicely 08/22/15; patient's wounds were covered in a surface eschar with surrounding slough for.  All of this underwent a surgical debridement including slough over the actual wound bed removing subcutaneous tissue. The wounds on the right lateral leg and right lateral leg all have improved quite nicely. She has been using Aquacel Ag Electronic Signature(s) Signed: 08/22/2015 3:38:56 PM By: Baltazar Najjar MD Entered By: Baltazar Najjar on 08/22/2015 09:12:26 Petraitis, [name] ([id number]) -------------------------------------------------------------------------------- Physical Exam Details Patient Name: Rheaume, Erinn H. Date of Service: 08/22/2015 8:00 AM Medical Record Patient Account Number: [id number] Number: Treating RN: Phillis Haggis [birth year] (59 y.o. Other Clinician: Date of Birth/Sex: Female) Treating Coner Gibbard Primary Care Physician/Extender: Comer Locket Physician: Referring Physician: Jones Broom in Treatment: 29 Notes Wound exam; after debridement all of the wounds appear to be quite healthy although there is still a gritty feeling surface eschar on the area on the left lateral leg. There is no evidence of surrounding infection. She still has a fair amount of edema using Profore light wraps Electronic Signature(s) Signed: 08/22/2015 3:38:56 PM By: Baltazar Najjar MD Entered By: Baltazar Najjar on 08/22/2015 09:13:56 Rebel, Elfie H. ([id number]) --------------------------------------------------------------------------------  Physician Orders Details Patient Name: YONA, KOSEK. Date of Service: 08/22/2015 8:00 AM Medical Record Patient Account Number: [id number] Number: Treating RN: Phillis Haggis March 15, 1956 (59 y.o. Other Clinician: Date of Birth/Sex: Female) Treating Gelsey Amyx Primary Care Physician/Extender: Comer Locket Physician: Referring Physician: Jones Broom in Treatment: 63 Verbal / Phone Orders: Yes Clinician: Ashok Cordia, Debi Read Back and Verified: Yes Diagnosis  Coding Wound Cleansing Wound #1 Right,Anterior Lower Leg o Clean wound with Normal Saline. o Cleanse wound with mild soap and water o May Shower, gently pat wound dry prior to applying new dressing. Wound #3 Left,Lateral Lower Leg o Clean wound with Normal Saline. o Cleanse wound with mild soap and water o May Shower, gently pat wound dry prior to applying new dressing. Wound #8 Right,Posterior Lower Leg o Clean wound with Normal Saline. o Cleanse wound with mild soap and water o May Shower, gently pat wound dry prior to applying new dressing. Anesthetic Wound #1 Right,Anterior Lower Leg o Topical Lidocaine 4% cream applied to wound bed prior to debridement Wound #3 Left,Lateral Lower Leg o Topical Lidocaine 4% cream applied to wound bed prior to debridement Wound #8 Right,Posterior Lower Leg o Topical Lidocaine 4% cream applied to wound bed prior to debridement Skin Barriers/Peri-Wound Care Wound #1 Right,Anterior Lower Leg o Antifungal cream o Barrier cream Wound #3 Left,Lateral Lower Leg o Antifungal cream o Barrier cream Thieme, Nicholette H. ([id number]) Wound #8 Right,Posterior Lower Leg o Antifungal cream o Barrier cream Primary Wound Dressing Wound #1 Right,Anterior Lower Leg o Iodoflex Wound #3 Left,Lateral Lower Leg o Iodoflex Wound #8 Right,Posterior Lower Leg o Iodoflex Secondary Dressing Wound #1 Right,Anterior Lower Leg o ABD pad - please add ABD pad to dressing o Dry Gauze Wound #3 Left,Lateral Lower Leg o ABD pad - please add ABD pad to dressing o Dry Gauze Wound #8 Right,Posterior Lower Leg o ABD pad - please add ABD pad to dressing o Dry Gauze Dressing Change Frequency Wound #1 Right,Anterior Lower Leg o Change Dressing Monday, Wednesday, Friday - Wednesday in wound care center Wound #3 Left,Lateral Lower Leg o Change Dressing Monday, Wednesday, Friday - Wednesday in wound care center Wound  #8 Right,Posterior Lower Leg o Change Dressing Monday, Wednesday, Friday - Wednesday in wound care center Follow-up Appointments Wound #1 Right,Anterior Lower Leg o Return Appointment in 1 week. Wound #3 Left,Lateral Lower Leg o Return Appointment in 1 week. Wound #8 Right,Posterior Lower Leg o Return Appointment in 1 week. Dyment, Nathalie H. ([id number]) Edema Control Wound #1 Right,Anterior Lower Leg o 4 Layer Compression System - Bilateral o Elevate legs to the level of the heart and pump ankles as often as possible - Elevate legs at Dialysis Wound #3 Left,Lateral Lower Leg o 4 Layer Compression System - Bilateral o Elevate legs to the level of the heart and pump ankles as often as possible - Elevate legs at Dialysis Wound #8 Right,Posterior Lower Leg o 4 Layer Compression System - Bilateral o Elevate legs to the level of the heart and pump ankles as often as possible - Elevate legs at Dialysis Off-Loading Wound #1 Right,Anterior Lower Leg o Turn and reposition every 2 hours Wound #3 Left,Lateral Lower Leg o Turn and reposition every 2 hours Wound #8 Right,Posterior Lower Leg o Turn and reposition every 2 hours Additional Orders / Instructions Wound #1 Right,Anterior Lower Leg o Activity as tolerated Wound #3 Left,Lateral Lower Leg o Activity as tolerated Wound #8 Right,Posterior Lower Leg o Activity as  tolerated Home Health Wound #1 Right,Anterior Lower Leg o Continue Home Health Visits - Advanced: Monday, Wednesday and Friday **WRAP GOES FROM BASE OF TOES TO THREE FINGER WIDTHS BENEATH BEND IN KNEE. Use Unna to anchor. o Home Health Nurse may visit PRN to address patientos wound care needs. o FACE TO FACE ENCOUNTER: MEDICARE and MEDICAID PATIENTS: I certify that this patient is under my care and that I had a face-to-face encounter that meets the physician face-to-face encounter requirements with this patient on this date. The  encounter with the patient was in whole or in part for the following MEDICAL CONDITION: (primary reason for Home Healthcare) MEDICAL NECESSITY: I certify, that based on my findings, NURSING services are a medically necessary home health service. HOME BOUND STATUS: I certify that my clinical findings support that this patient is homebound (i.e., Due to illness or injury, pt requires aid of Kemmerer, Bridgett H. ([id number]) supportive devices such as crutches, cane, wheelchairs, walkers, the use of special transportation or the assistance of another person to leave their place of residence. There is a normal inability to leave the home and doing so requires considerable and taxing effort. Other absences are for medical reasons / religious services and are infrequent or of short duration when for other reasons). o If current dressing causes regression in wound condition, may D/C ordered dressing product/s and apply Normal Saline Moist Dressing daily until next Wound Healing Center / Other MD appointment. Notify Wound Healing Center of regression in wound condition at [phone]. o Please direct any NON-WOUND related issues/requests for orders to patient's Primary Care Physician Wound #3 Left,Lateral Lower Leg o Continue Home Health Visits - Advanced: Monday, Wednesday and Friday **WRAP GOES FROM BASE OF TOES TO THREE FINGER WIDTHS BENEATH BEND IN KNEE. o Home Health Nurse may visit PRN to address patientos wound care needs. o FACE TO FACE ENCOUNTER: MEDICARE and MEDICAID PATIENTS: I certify that this patient is under my care and that I had a face-to-face encounter that meets the physician face-to-face encounter requirements with this patient on this date. The encounter with the patient was in whole or in part for the following MEDICAL CONDITION: (primary reason for Home Healthcare) MEDICAL NECESSITY: I certify, that based on my findings, NURSING services are a medically necessary home  health service. HOME BOUND STATUS: I certify that my clinical findings support that this patient is homebound (i.e., Due to illness or injury, pt requires aid of supportive devices such as crutches, cane, wheelchairs, walkers, the use of special transportation or the assistance of another person to leave their place of residence. There is a normal inability to leave the home and doing so requires considerable and taxing effort. Other absences are for medical reasons / religious services and are infrequent or of short duration when for other reasons). o If current dressing causes regression in wound condition, may D/C ordered dressing product/s and apply Normal Saline Moist Dressing daily until next Wound Healing Center / Other MD appointment. Notify Wound Healing Center of regression in wound condition at [phone]. o Please direct any NON-WOUND related issues/requests for orders to patient's Primary Care Physician Wound #8 Right,Posterior Lower Leg o Continue Home Health Visits - Advanced: Monday, Wednesday and Friday **WRAP GOES FROM BASE OF TOES TO THREE FINGER WIDTHS BENEATH BEND IN KNEE. o Home Health Nurse may visit PRN to address patientos wound care needs. o FACE TO FACE ENCOUNTER: MEDICARE and MEDICAID PATIENTS: I certify that this patient is under my care and that  I had a face-to-face encounter that meets the physician face-to-face encounter requirements with this patient on this date. The encounter with the patient was in whole or in part for the following MEDICAL CONDITION: (primary reason for Home Healthcare) MEDICAL NECESSITY: I certify, that based on my findings, NURSING services are a medically necessary home health service. HOME BOUND STATUS: I certify that my clinical findings support that this patient is homebound (i.e., Due to illness or injury, pt requires aid of supportive devices such as crutches, cane, wheelchairs, walkers, the use of  special transportation or the assistance of another person to leave their place of residence. There is a normal inability to leave the home and doing so requires considerable and taxing effort. Other absences are for medical reasons / religious services and are infrequent or of short duration when for other reasons). Fratus, Avionna H. ([id number]) o If current dressing causes regression in wound condition, may D/C ordered dressing product/s and apply Normal Saline Moist Dressing daily until next Wound Healing Center / Other MD appointment. Notify Wound Healing Center of regression in wound condition at [phone]. o Please direct any NON-WOUND related issues/requests for orders to patient's Primary Care Physician Medications-please add to medication list. Wound #1 Right,Anterior Lower Leg o Other: - Antifungal Cream to bilateral lower legs, NOT ON WOUNDS Electronic Signature(s) Signed: 08/22/2015 3:38:56 PM By: Baltazar Najjar MD Signed: 08/24/2015 5:51:12 PM By: Alejandro Mulling Entered By: Alejandro Mulling on 08/22/2015 09:32:26 Cheslock, Malayna H. ([id number]) -------------------------------------------------------------------------------- Problem List Details Patient Name: Gulledge, Leimomi H. Date of Service: 08/22/2015 8:00 AM Medical Record Patient Account Number: [id number] Number: Treating RN: Phillis Haggis Jun 17, 1956 (59 y.o. Other Clinician: Date of Birth/Sex: Female) Treating Tedra Coppernoll Primary Care Physician/Extender: Comer Locket Physician: Referring Physician: Jones Broom in Treatment: 62 Active Problems ICD-10 Encounter Code Description Active Date Diagnosis E11.622 Type 2 diabetes mellitus with other skin ulcer 01/30/2015 Yes N18.6 End stage renal disease 01/30/2015 Yes L97.222 Non-pressure chronic ulcer of left calf with fat layer 01/30/2015 Yes exposed L97.212 Non-pressure chronic ulcer of right calf with fat layer  01/30/2015 Yes exposed R60.0 Localized edema 04/19/2015 Yes I89.0 Lymphedema, not elsewhere classified 07/12/2015 Yes Inactive Problems Resolved Problems Electronic Signature(s) Signed: 08/22/2015 3:38:56 PM By: Baltazar Najjar MD Entered By: Baltazar Najjar on 08/22/2015 09:08:01 Kemler, [name] ([id number]) -------------------------------------------------------------------------------- Progress Note Details Patient Name: Grandinetti, Caddie H. Date of Service: 08/22/2015 8:00 AM Medical Record Patient Account Number: [id number] Number: Treating RN: Phillis Haggis [birth year] (59 y.o. Other Clinician: Date of Birth/Sex: Female) Treating Treazure Nery Primary Care Physician/Extender: Comer Locket Physician: Referring Physician: Jones Broom in Treatment: 59 Subjective Chief Complaint Information obtained from Patient Chronic bilateral calf ulcers. History of Present Illness (HPI) Pleasant 60 year old with h/o DM (Hgb A1c 7.4 in Aug 2016), ESRD (on hemodialysis since July 2016). Presented to her PCP, Dr. Venora Maples, with BLE ulcers since June 2016. 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 Left lower extremity ultrasound in May 2016 showed no evidence for DVT. No assessment for venous insufficiency. Culture 04/19/2015 grew methicillin sensitive staph aureus (sensitive to tetracycline), Stenotrophomonas maltophilia, and Enterococcus faecalis. Completed course of doxycycline. Hospitalized in Nov 2016 for worsening cellulitis. Treated with IV antibiotics. No operative debridement or biopsy. Punch biopsy of left calf ulcer 06/28/2015 showed no evidence for malignancy. Bilateral lower extremity venous ultrasound 07/20/2015 showed no evidence for DVT  or chronic venous insufficiency. Performing dressing changes with silver alginate.  Tolerating Profore light bilaterally 3x/week. She returns to clinic and is without complaints. No significant pain. No fever or chills. Less drainage. 08/09/2015 -- details over the last several months noted. Continues to stay hemodialysis 3 times a week. Says she is improving slowly and the pain is much better 08/16/15; this patient has wounds on her bilateral lower extremities which is been present since June 2016. The exact etiology of this is not really clear although she was hospitalized in October for cellulitis. I'm not sure if this was felt to be bilateral. In any case she had a debridement last week. She is using Aquacel Ag. Per the nursing staff the wounds continue to do nicely 08/22/15; patient's wounds were covered in a surface eschar with surrounding slough for. All of this underwent a surgical debridement including slough over the actual wound bed removing subcutaneous tissue. The wounds on the right lateral leg and right lateral leg all have improved quite nicely. She has been using Aquacel Ag Finnie, Bennye H. ([id number]) Objective Constitutional Vitals Time Taken: 8:07 AM, Height: 68 in, Weight: 294 lbs, BMI: 44.7, Temperature: 97.6 F, Pulse: 78 bpm, Respiratory Rate: 20 breaths/min, Blood Pressure: 143/69 mmHg. Integumentary (Hair, Skin) Wound #1 status is Open. Original cause of wound was Gradually Appeared. The wound is located on the Right,Anterior Lower Leg. The wound measures 7cm length x 1.3cm width x 0.2cm depth; 7.147cm^2 area and 1.429cm^3 volume. The wound is limited to skin breakdown. There is a large amount of serous drainage noted. The wound margin is thickened. There is medium (34-66%) pink granulation within the wound bed. There is a medium (34-66%) amount of necrotic tissue within the wound bed including Eschar and Adherent Slough. The periwound skin appearance exhibited: Scarring, Moist. The periwound skin appearance did not exhibit: Callus, Crepitus,  Excoriation, Fluctuance, Friable, Induration, Localized Edema, Rash, Dry/Scaly, Maceration, Atrophie Blanche, Cyanosis, Ecchymosis, Hemosiderin Staining, Mottled, Pallor, Rubor, Erythema. Periwound temperature was noted as No Abnormality. The periwound has tenderness on palpation. Wound #3 status is Open. Original cause of wound was Gradually Appeared. The wound is located on the Left,Lateral Lower Leg. The wound measures 11cm length x 3.5cm width x 0.3cm depth; 30.238cm^2 area and 9.071cm^3 volume. The wound is limited to skin breakdown. There is no tunneling or undermining noted. There is a large amount of serous drainage noted. The wound margin is flat and intact. There is small (1-33%) red granulation within the wound bed. There is a large (67-100%) amount of necrotic tissue within the wound bed including Adherent Slough. The periwound skin appearance exhibited: Scarring, Moist. The periwound skin appearance did not exhibit: Callus, Crepitus, Excoriation, Fluctuance, Friable, Induration, Localized Edema, Rash, Dry/Scaly, Maceration, Atrophie Blanche, Cyanosis, Ecchymosis, Hemosiderin Staining, Mottled, Pallor, Rubor, Erythema. Periwound temperature was noted as No Abnormality. The periwound has tenderness on palpation. Wound #8 status is Open. Original cause of wound was Gradually Appeared. The wound is located on the Right,Posterior Lower Leg. The wound measures 6.4cm length x 3.5cm width x 0.2cm depth; 17.593cm^2 area and 3.519cm^3 volume. The wound is limited to skin breakdown. There is no tunneling or undermining noted. There is a large amount of serous drainage noted. The wound margin is flat and intact. There is medium (34-66%) red granulation within the wound bed. There is a medium (34-66%) amount of necrotic tissue within the wound bed including Eschar and Adherent Slough. The periwound skin appearance exhibited: Localized Edema, Moist. The periwound skin appearance  did not exhibit:  Callus, Crepitus, Excoriation, Fluctuance, Friable, Induration, Rash, Scarring, Dry/Scaly, Maceration, Atrophie Blanche, Cyanosis, Ecchymosis, Hemosiderin Staining, Mottled, Pallor, Rubor, Erythema. Periwound temperature was noted as No Abnormality. The periwound has tenderness on palpation. Assessment Nucci, Jadah H. 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 There was a Skin/Subcutaneous Tissue Debridement (40981-19147) debridement with total area of 9.1 sq cm performed by Maxwell Caul, MD. with the following instrument(s): Curette to remove Viable and Non-Viable tissue/material including Exudate, Fibrin/Slough, and Subcutaneous after achieving pain control using Other (lidocaine 4% cream). A time out was conducted prior to the start of the procedure. A Minimum amount of bleeding was controlled with Pressure. The procedure was tolerated well with a pain level of 0 throughout and a pain level of 0 following the procedure. Post Debridement Measurements: 7cm length x 1.3cm width x 0.3cm depth; 2.144cm^3 volume. Post procedure Diagnosis Wound #1: Same as Pre-Procedure Wound #3 Wound #3 is a Venous Leg Ulcer located on the Left,Lateral Lower Leg . There was a Skin/Subcutaneous Tissue Debridement ([id number]) debridement with total area of 38.5 sq cm performed by Maxwell Caul, MD. with the following instrument(s): Curette to remove Viable and Non-Viable tissue/material including Exudate, Fibrin/Slough, and Subcutaneous after achieving pain control using Other (lidocaine 4% cream). A time out  was conducted prior to the start of the procedure. A Minimum amount of bleeding was controlled with Pressure. The procedure was tolerated well with a pain level of 0 throughout and a pain level of 0 following the procedure. Post Debridement Measurements: 11cm length x 3.5cm width x 0.4cm depth; 12.095cm^3 volume. Post procedure Diagnosis Wound #3: Same as Pre-Procedure Wound #8 Wound #8 is a Venous Leg Ulcer located on the Right,Posterior Lower Leg . There was a Skin/Subcutaneous Tissue Debridement ([id number]) debridement with total area of 22.4 sq cm performed by Maxwell Caul, MD. with the following instrument(s): Curette to remove Viable and Non-Viable tissue/material including Exudate, Fibrin/Slough, and Subcutaneous after achieving pain control using Other (lidocaine 4% cream). A time out was conducted prior to the start of the procedure. A Minimum amount of bleeding was controlled with Pressure. The procedure was tolerated well with a pain level of 0 throughout and a pain level of 0 following the procedure. Post Debridement Measurements: 6.4cm length x 3.5cm width x 0.3cm depth; 5.278cm^3 volume. Fesler, Geneal HMarland Kitchen ([id number]) Post procedure Diagnosis Wound #8: Same as Pre-Procedure Plan Wound Cleansing: Wound #1 Right,Anterior Lower Leg: Clean wound with Normal Saline. Cleanse wound with mild soap and water May Shower, gently pat wound dry prior to applying new dressing. Wound #3 Left,Lateral Lower Leg: Clean wound with Normal Saline. Cleanse wound with mild soap and water May Shower, gently pat wound dry prior to applying new dressing. Wound #8 Right,Posterior Lower Leg: Clean wound with Normal Saline. Cleanse wound with mild soap and water May Shower, gently pat wound dry prior to applying new dressing. Anesthetic: Wound #1 Right,Anterior Lower Leg: Topical Lidocaine 4% cream applied to wound bed prior to debridement Wound #3 Left,Lateral Lower Leg: Topical Lidocaine  4% cream applied to wound bed prior to debridement Wound #8 Right,Posterior  Lower Leg: Topical Lidocaine 4% cream applied to wound bed prior to debridement Skin Barriers/Peri-Wound Care: Wound #1 Right,Anterior Lower Leg: Barrier cream Wound #3 Left,Lateral Lower Leg: Antifungal cream Barrier cream Wound #8 Right,Posterior Lower Leg: Antifungal cream Barrier cream Primary Wound Dressing: Wound #1 Right,Anterior Lower Leg: Iodoflex Wound #3 Left,Lateral Lower Leg: Iodoflex Wound #8 Right,Posterior Lower Leg: Iodoflex Secondary Dressing: Wound #1 Right,Anterior Lower Leg: ABD pad - please add ABD pad to dressing Dry Gauze Wound #3 Left,Lateral Lower Leg: [place], Ashtynn H. ([id number]) ABD pad - please add ABD pad to dressing Dry Gauze Wound #8 Right,Posterior Lower Leg: ABD pad - please add ABD pad to dressing Dry Gauze Dressing Change Frequency: Wound #1 Right,Anterior Lower Leg: Change Dressing Monday, Wednesday, Friday - Wednesday in wound care center Wound #3 Left,Lateral Lower Leg: Change Dressing Monday, Wednesday, Friday - Wednesday in wound care center Wound #8 Right,Posterior Lower Leg: Change Dressing Monday, Wednesday, Friday - Wednesday in wound care center Follow-up Appointments: Wound #1 Right,Anterior Lower Leg: Return Appointment in 1 week. Wound #3 Left,Lateral Lower Leg: Return Appointment in 1 week. Wound #8 Right,Posterior Lower Leg: Return Appointment in 1 week. Edema Control: Wound #1 Right,Anterior Lower Leg: 4 Layer Compression System - Bilateral Elevate legs to the level of the heart and pump ankles as often as possible - Elevate legs at Dialysis Wound #3 Left,Lateral Lower Leg: 4 Layer Compression System - Bilateral Elevate legs to the level of the heart and pump ankles as often as possible - Elevate legs at Dialysis Wound #8 Right,Posterior Lower Leg: 4 Layer Compression System - Bilateral Elevate legs to the level of the heart and pump  ankles as often as possible - Elevate legs at Dialysis Off-Loading: Wound #1 Right,Anterior Lower Leg: Turn and reposition every 2 hours Wound #3 Left,Lateral Lower Leg: Turn and reposition every 2 hours Wound #8 Right,Posterior Lower Leg: Turn and reposition every 2 hours Additional Orders / Instructions: Wound #1 Right,Anterior Lower Leg: Activity as tolerated Wound #3 Left,Lateral Lower Leg: Activity as tolerated Wound #8 Right,Posterior Lower Leg: Activity as tolerated Home Health: Wound #1 Right,Anterior Lower Leg: Continue Home Health Visits - Advanced: Monday, Wednesday and Friday **WRAP GOES FROM BASE OF TOES TO THREE FINGER WIDTHS BENEATH BEND IN KNEE. Use Unna to anchor. Home Health Nurse may visit PRN to address patient s wound care needs. FACE TO FACE ENCOUNTER: MEDICARE and MEDICAID PATIENTS: I certify that this patient is under my care and that I had a face-to-face encounter that meets the physician face-to-face encounter requirements with this patient on this date. The encounter with the patient was in whole or in part for the [facility], Starlina H. ([id number]) following MEDICAL CONDITION: (primary reason for Home Healthcare) MEDICAL NECESSITY: I certify, that based on my findings, NURSING services are a medically necessary home health service. HOME BOUND STATUS: I certify that my clinical findings support that this patient is homebound (i.e., Due to illness or injury, pt requires aid of supportive devices such as crutches, cane, wheelchairs, walkers, the use of special transportation or the assistance of another person to leave their place of residence. There is a normal inability to leave the home and doing so requires considerable and taxing effort. Other absences are for medical reasons / religious services and are infrequent or of short duration when for other reasons). If current dressing causes regression in wound condition, may D/C ordered dressing product/s and  apply Normal Saline Moist Dressing daily until next Wound Healing Center / Other  MD appointment. Notify Wound Healing Center of regression in wound condition at [phone]. Please direct any NON-WOUND related issues/requests for orders to patient's Primary Care Physician Wound #3 Left,Lateral Lower Leg: Continue Home Health Visits - Advanced: Monday, Wednesday and Friday **WRAP GOES FROM BASE OF TOES TO THREE FINGER WIDTHS BENEATH BEND IN KNEE. Home Health Nurse may visit PRN to address patient s wound care needs. FACE TO FACE ENCOUNTER: MEDICARE and MEDICAID PATIENTS: I certify that this patient is under my care and that I had a face-to-face encounter that meets the physician face-to-face encounter requirements with this patient on this date. The encounter with the patient was in whole or in part for the following MEDICAL CONDITION: (primary reason for Home Healthcare) MEDICAL NECESSITY: I certify, that based on my findings, NURSING services are a medically necessary home health service. HOME BOUND STATUS: I certify that my clinical findings support that this patient is homebound (i.e., Due to illness or injury, pt requires aid of supportive devices such as crutches, cane, wheelchairs, walkers, the use of special transportation or the assistance of another person to leave their place of residence. There is a normal inability to leave the home and doing so requires considerable and taxing effort. Other absences are for medical reasons / religious services and are infrequent or of short duration when for other reasons). If current dressing causes regression in wound condition, may D/C ordered dressing product/s and apply Normal Saline Moist Dressing daily until next Wound Healing Center / Other MD appointment. Notify Wound Healing Center of regression in wound condition at [phone]. Please direct any NON-WOUND related issues/requests for orders to patient's Primary Care Physician Wound #8  Right,Posterior Lower Leg: Continue Home Health Visits - Advanced: Monday, Wednesday and Friday **WRAP GOES FROM BASE OF TOES TO THREE FINGER WIDTHS BENEATH BEND IN KNEE. Home Health Nurse may visit PRN to address patient s wound care needs. FACE TO FACE ENCOUNTER: MEDICARE and MEDICAID PATIENTS: I certify that this patient is under my care and that I had a face-to-face encounter that meets the physician face-to-face encounter requirements with this patient on this date. The encounter with the patient was in whole or in part for the following MEDICAL CONDITION: (primary reason for Home Healthcare) MEDICAL NECESSITY: I certify, that based on my findings, NURSING services are a medically necessary home health service. HOME BOUND STATUS: I certify that my clinical findings support that this patient is homebound (i.e., Due to illness or injury, pt requires aid of supportive devices such as crutches, cane, wheelchairs, walkers, the use of special transportation or the assistance of another person to leave their place of residence. There is a normal inability to leave the home and doing so requires considerable and taxing effort. Other absences are for medical reasons / religious services and are infrequent or of short duration when for other reasons). If current dressing causes regression in wound condition, may D/C ordered dressing product/s and apply Normal Saline Moist Dressing daily until next Wound Healing Center / Other MD appointment. Notify Wound Healing Center of regression in wound condition at [phone]. Please direct any NON-WOUND related issues/requests for orders to patient's Primary Care Physician Medications-please add to medication list.: Wound #1 Right,Anterior Lower Leg: Other: - Antifungal Cream to bilateral lower legs, NOT ON WOUNDS Robak, Celisa H. ([id number]) #1 Change aquacel AG to Iodoflex, Increase compression to porfore. ABD pads.May need an  advanced option Electronic Signature(s) Signed: 08/22/2015 3:38:56 PM By: Baltazar Najjar MD Entered By: Baltazar Najjar on  08/22/2015 09:15:28 Cockrum, Reiley HMarland Kitchen ([id number]) -------------------------------------------------------------------------------- SuperBill Details Patient Name: Dupin, Fatim H. Date of Service: 08/22/2015 Medical Record Patient Account Number: [id number] Number: Treating RN: Phillis Haggis 02-07-1956 (59 y.o. Other Clinician: Date of Birth/Sex: Female) Treating Tahj Lindseth Primary Care Physician/Extender: [place] Physician: Weeks in Treatment: 29 Referring Physician: Fidel Levy Diagnosis Coding ICD-10 Codes Code Description [id number] Type 2 diabetes mellitus with other skin ulcer N18.6 End stage renal disease L97.222 Non-pressure chronic ulcer of left calf with fat layer exposed L97.212 Non-pressure chronic ulcer of right calf with fat layer exposed R60.0 Localized edema I89.0 Lymphedema, not elsewhere classified Facility Procedures CPT4 Code: [id number] Description: 11042 - DEB SUBQ TISSUE 20 SQ CM/< ICD-10 Description Diagnosis L97.222 Non-pressure chronic ulcer of left calf with fat l Modifier: ayer exposed Quantity: 1 CPT4 Code: [id number] Description: 11045 - DEB SUBQ TISS EA ADDL 20CM ICD-10 Description Diagnosis L97.212 Non-pressure chronic ulcer of right calf with fat Modifier: layer exposed Quantity: 3 Physician Procedures CPT4 Code: [id number] Description: 11042 - WC PHYS SUBQ TISS 20 SQ CM ICD-10 Description Diagnosis L97.222 Non-pressure chronic ulcer of left calf with fat la Modifier: yer exposed Quantity: 1 CPT4 Code: [id number] Bhola, PAM Description: 11045 - WC PHYS SUBQ TISS EA ADDL 20 CM ICD-10 Description Diagnosis L97.212 Non-pressure chronic ulcer of right calf with fat l ELA H. ([id number]) Modifier: ayer exposed Quantity: 3 Electronic Signature(s) Signed: 08/22/2015 3:38:56 PM By: Baltazar Najjar  MD Entered By: Baltazar Najjar on 08/22/2015 09:16:35

## 2015-08-29 ENCOUNTER — Encounter: Payer: BC Managed Care – PPO | Admitting: Internal Medicine

## 2015-08-29 DIAGNOSIS — E11622 Type 2 diabetes mellitus with other skin ulcer: Secondary | ICD-10-CM | POA: Diagnosis not present

## 2015-08-29 DIAGNOSIS — I87313 Chronic venous hypertension (idiopathic) with ulcer of bilateral lower extremity: Secondary | ICD-10-CM | POA: Diagnosis not present

## 2015-08-29 DIAGNOSIS — L97821 Non-pressure chronic ulcer of other part of left lower leg limited to breakdown of skin: Secondary | ICD-10-CM | POA: Diagnosis not present

## 2015-08-29 DIAGNOSIS — L97811 Non-pressure chronic ulcer of other part of right lower leg limited to breakdown of skin: Secondary | ICD-10-CM | POA: Diagnosis not present

## 2015-08-31 NOTE — Progress Notes (Signed)
CELA, NEWCOM ([id number]) Visit Report for 08/29/2015 Arrival Information Details Patient Name: [name], [name]. Date of Service: 08/29/2015 8:00 AM Medical Record Patient Account Number: [id number] Number: Treating RN: Phillis Haggis May 16, 1956 (59 y.o. Other Clinician: Date of Birth/Sex: Female) Treating ROBSON, MICHAEL Primary Care Physician/Extender: Comer Locket Physician: Referring Physician: Jones Broom in Treatment: 30 Visit Information History Since Last Visit All ordered tests and consults were completed: No Patient Arrived: Ambulatory Added or deleted any medications: No Arrival Time: 08:15 Any new allergies or adverse reactions: No Accompanied By: self Had a fall or experienced change in No Transfer Assistance: None activities of daily living that may affect Patient Identification Verified: Yes risk of falls: Secondary Verification Process Yes Signs or symptoms of abuse/neglect since last No Completed: visito Patient Requires Transmission- No Hospitalized since last visit: No Based Precautions: Pain Present Now: No Patient Has Alerts: Yes Patient Alerts: DMII ABI Yznaga Bilateral Electronic Signature(s) Signed: 08/29/2015 4:05:48 PM By: Alejandro Mulling Entered By: Alejandro Mulling on 08/29/2015 08:15:40 [name], [name] ([id number]) -------------------------------------------------------------------------------- Encounter Discharge Information Details Patient Name: [name], [name] Date of Service: 08/29/2015 8:00 AM Medical Record Patient Account Number: [id number] Number: Treating RN: Phillis Haggis 1955/11/01 (59 y.o. Other Clinician: Date of Birth/Sex: Female) Treating ROBSON, MICHAEL Primary Care Physician/Extender: Comer Locket Physician: Referring Physician: Jones Broom in Treatment: 30 Encounter Discharge Information Items Discharge Pain Level: 0 Discharge Condition:  Stable Ambulatory Status: Ambulatory Discharge Destination: Home Transportation: Private Auto Accompanied By: SELF Schedule Follow-up Appointment: Yes Medication Reconciliation completed and provided to Patient/Care Yes Creg Gilmer: Provided on Clinical Summary of Care: 08/29/2015 Form Type Recipient Paper Patient PM Electronic Signature(s) Signed: 08/29/2015 9:20:42 AM By: Gwenlyn Perking Entered By: Gwenlyn Perking on 08/29/2015 09:20:42 [name], [name] ([id number]) -------------------------------------------------------------------------------- Lower Extremity Assessment Details Patient Name: [name], [name] Date of Service: 08/29/2015 8:00 AM Medical Record Patient Account Number: [id number] Number: Treating RN: Phillis Haggis [birth year] (59 y.o. Other Clinician: Date of Birth/Sex: Female) Treating ROBSON, MICHAEL Primary Care Physician/Extender: Comer Locket Physician: Referring Physician: Jones Broom in Treatment: 30 Edema Assessment Assessed: [Left: No] [Right: No] E[Left: dema] [Right: :] Calf Left: Right: Point of Measurement: cm From Medial Instep 41.5 cm 39 cm Ankle Left: Right: Point of Measurement: cm From Medial Instep 23.5 cm 22 cm Vascular Assessment Pulses: Posterior Tibial Dorsalis Pedis Palpable: [Left:Yes] [Right:Yes] Extremity colors, hair growth, and conditions: Extremity Color: [Left:Red] [Right:Red] Temperature of Extremity: [Left:Warm] [Right:Warm] Capillary Refill: [Left:> 3 seconds] [Right:> 3 seconds] Toe Nail Assessment Left: Right: Thick: No No Discolored: No No Deformed: No No Improper Length and Hygiene: No No Electronic Signature(s) Signed: 08/29/2015 4:05:48 PM By: Alejandro Mulling Entered By: Alejandro Mulling on 08/29/2015 08:30:37 [name], [name] ([id number]) [name], [name] ([id number]) -------------------------------------------------------------------------------- Multi Wound Chart  Details Patient Name: [name], [name] Date of Service: 08/29/2015 8:00 AM Medical Record Patient Account Number: [id number] Number: Treating RN: Phillis Haggis 1955-11-05 (59 y.o. Other Clinician: Date of Birth/Sex: Female) Treating ROBSON, MICHAEL Primary Care Physician/Extender: Comer Locket Physician: Referring Physician: Jones Broom in Treatment: 30 Vital Signs Height(in): 68 Pulse(bpm): 79 Weight(lbs): 294 Blood Pressure 158/80 (mmHg): Body Mass Index(BMI): 45 Temperature(F): 98.0 Respiratory Rate 20 (breaths/min): Photos: [1:No Photos] [3:No Photos] [8:No Photos] Wound Location: [1:Right Lower Leg - Anterior Left Lower Leg - Lateral] [8:Right Lower Leg - Posterior] Wounding Event: [1:Gradually Appeared] [3:Gradually Appeared] [8:Gradually Appeared] Primary Etiology: [1:Venous Leg Ulcer] [  3:Venous Leg Ulcer] [8:Venous Leg Ulcer] Comorbid History: [1:Hypertension, Type II Diabetes] [3:Hypertension, Type II Diabetes] [8:Hypertension, Type II Diabetes] Date Acquired: [1:01/09/2015] [3:01/09/2015] [8:03/27/2015] Weeks of Treatment: [1:30] [3:30] [8:21] Wound Status: [1:Open] [3:Open] [8:Open] Measurements L x W x D 5.5x1.3x0.1 [3:11x3.5x0.2] [8:5x2x0.2] (cm) Area (cm) : [1:5.616] [3:30.238] [8:7.854] Volume (cm) : [1:0.562] [3:6.048] [8:1.571] % Reduction in Area: [1:-11848.90%] [3:-175.00%] [8:-344.50%] % Reduction in Volume: -11140.00% [3:-449.80%] [8:-787.60%] Classification: [1:Full Thickness Without Exposed Support Structures] [3:Full Thickness Without Exposed Support Structures] [8:Full Thickness Without Exposed Support Structures] HBO Classification: [1:Grade 2] [3:Grade 1] [8:Grade 1] Exudate Amount: [1:Large] [3:Large] [8:Large] Exudate Type: [1:Serous] [3:Serous] [8:Serous] Exudate Color: [1:amber] [3:amber] [8:amber] Wound Margin: [1:Thickened] [3:Flat and Intact] [8:Flat and Intact] Granulation Amount: [1:Medium (34-66%)]  [3:Small (1-33%)] [8:Medium (34-66%)] Granulation Quality: [1:Pink] [3:Red] [8:Red, Hyper-granulation] Necrotic Amount: [1:Medium (34-66%)] [3:Large (67-100%)] [8:Medium (34-66%)] Necrotic Tissue: Eschar, Adherent Slough Adherent [name], Adherent Slough Exposed Structures: Fascia: No Fascia: No Fascia: No Fat: No Fat: No Fat: No Tendon: No Tendon: No Tendon: No Muscle: No Muscle: No Muscle: No Joint: No Joint: No Joint: No Bone: No Bone: No Bone: No Limited to Skin Limited to Skin Limited to Skin Breakdown Breakdown Breakdown Epithelialization: Small (1-33%) Small (1-33%) Small (1-33%) Periwound Skin Texture: Scarring: Yes Scarring: Yes Edema: Yes Edema: No Edema: No Excoriation: No Excoriation: No Excoriation: No Induration: No Induration: No Induration: No Callus: No Callus: No Callus: No Crepitus: No Crepitus: No Crepitus: No Fluctuance: No Fluctuance: No Fluctuance: No Friable: No Friable: No Friable: No Rash: No Rash: No Rash: No Scarring: No Periwound Skin Moist: Yes Moist: Yes Moist: Yes Moisture: Maceration: No Maceration: No Maceration: No Dry/Scaly: No Dry/Scaly: No Dry/Scaly: No Periwound Skin Color: Atrophie Blanche: No Atrophie Blanche: No Atrophie Blanche: No Cyanosis: No Cyanosis: No Cyanosis: No Ecchymosis: No Ecchymosis: No Ecchymosis: No Erythema: No Erythema: No Erythema: No Hemosiderin Staining: No Hemosiderin Staining: No Hemosiderin Staining: No Mottled: No Mottled: No Mottled: No Pallor: No Pallor: No Pallor: No Rubor: No Rubor: No Rubor: No Temperature: No Abnormality No Abnormality No Abnormality Tenderness on Yes Yes Yes Palpation: Wound Preparation: Ulcer Cleansing: Other: Ulcer Cleansing: Other: Ulcer Cleansing: Other: soap and water soap and water soap and water Topical Anesthetic Topical Anesthetic Topical Anesthetic Applied: Other: lidocaine Applied: Other: lidocaine Applied: Other:  lidocaine 4% 4% 4% Treatment Notes Electronic Signature(s) Signed: 08/29/2015 4:05:48 PM By: Alejandro Mulling Entered By: Alejandro Mulling on 08/29/2015 08:40:57 Kisling, [name] ([id number]) -------------------------------------------------------------------------------- Multi-Disciplinary Care Plan Details Patient Name: [name], [name] Date of Service: 08/29/2015 8:00 AM Medical Record Patient Account Number: [id number] Number: Treating RN: Phillis Haggis 17-Oct-1955 (59 y.o. Other Clinician: Date of Birth/Sex: Female) Treating ROBSON, MICHAEL Primary Care Physician/Extender: Comer Locket Physician: Referring Physician: Jones Broom in Treatment: 30 Active Inactive Orientation to the Wound Care Program Nursing Diagnoses: Knowledge deficit related to the wound healing center program Goals: Patient/caregiver will verbalize understanding of the Wound Healing Center Program Date Initiated: 01/30/2015 Goal Status: Active Interventions: Provide education on orientation to the wound center Notes: Pain, Acute or Chronic Nursing Diagnoses: Pain, acute or chronic: actual or potential Goals: Patient will verbalize adequate pain control and receive pain control interventions during procedures as needed Date Initiated: 01/30/2015 Goal Status: Active Patient/caregiver will verbalize adequate pain control between visits Date Initiated: 01/30/2015 Goal Status: Active Interventions: Assess comfort goal upon admission Encourage patient to take pain medications as prescribed Notes: Clayborn, Mavis H. ([id number]) Venous Leg Ulcer Nursing Diagnoses: Potential for  venous Insuffiency (use before diagnosis confirmed) Goals: Non-invasive venous studies are completed as ordered Date Initiated: 01/30/2015 Goal Status: Active Interventions: Assess peripheral edema status every visit. Notes: Wound/Skin Impairment Nursing Diagnoses: Impaired tissue  integrity Goals: Ulcer/skin breakdown will have a volume reduction of 30% by week 4 Date Initiated: 01/30/2015 Goal Status: Active Ulcer/skin breakdown will have a volume reduction of 50% by week 8 Date Initiated: 01/30/2015 Goal Status: Active Ulcer/skin breakdown will have a volume reduction of 80% by week 12 Date Initiated: 01/30/2015 Goal Status: Active Ulcer/skin breakdown will heal within 14 weeks Date Initiated: 01/30/2015 Goal Status: Active Interventions: Assess ulceration(s) every visit Notes: Electronic Signature(s) Signed: 08/29/2015 4:05:48 PM By: Alejandro Mulling Entered By: Alejandro Mulling on 08/29/2015 08:40:47 [name], [name] ([id number]) -------------------------------------------------------------------------------- Pain Assessment Details Patient Name: [name], [name] Date of Service: 08/29/2015 8:00 AM Medical Record Patient Account Number: [id number] Number: Treating RN: Phillis Haggis 1955/09/14 (59 y.o. Other Clinician: Date of Birth/Sex: Female) Treating ROBSON, MICHAEL Primary Care Physician/Extender: Comer Locket Physician: Referring Physician: Jones Broom in Treatment: 30 Active Problems Location of Pain Severity and Description of Pain Patient Has Paino No Site Locations Pain Management and Medication Current Pain Management: Electronic Signature(s) Signed: 08/29/2015 4:05:48 PM By: Alejandro Mulling Entered By: Alejandro Mulling on 08/29/2015 08:15:46 Lamos, [name] ([id number]) -------------------------------------------------------------------------------- Patient/Caregiver Education Details Patient Name: [name], [name] Date of Service: 08/29/2015 8:00 AM Medical Record Patient Account Number: [id number] Number: Treating RN: Phillis Haggis 22-Jan-1956 (59 y.o. Other Clinician: Date of Birth/Gender: Female) Treating ROBSON, MICHAEL Primary Care Physician/Extender: Comer Locket Physician: Weeks in Treatment: 30 Referring Physician: Fidel Levy Education Assessment Education Provided To: Patient Education Topics Provided Wound/Skin Impairment: Handouts: Other: do not get wraps wet Methods: Demonstration, Explain/Verbal Responses: State content correctly Electronic Signature(s) Signed: 08/29/2015 4:05:48 PM By: Alejandro Mulling Entered By: Alejandro Mulling on 08/29/2015 09:16:17 [name], Georgeann H. ([id number]) -------------------------------------------------------------------------------- Wound Assessment Details Patient Name: Everage, Tkai H. Date of Service: 08/29/2015 8:00 AM Medical Record Patient Account Number: [id number] Number: Treating RN: Phillis Haggis 1955/08/19 (59 y.o. Other Clinician: Date of Birth/Sex: Female) Treating ROBSON, MICHAEL Primary Care Physician/Extender: Comer Locket Physician: Referring Physician: Jones Broom in Treatment: 30 Wound Status Wound Number: 1 Primary Etiology: Venous Leg Ulcer Wound Location: Right Lower Leg - Anterior Wound Status: Open Wounding Event: Gradually Appeared Comorbid History: Hypertension, Type II Diabetes Date Acquired: 01/09/2015 Weeks Of Treatment: 30 Clustered Wound: No Photos Photo Uploaded By: Alejandro Mulling on 08/29/2015 15:25:16 Wound Measurements Length: (cm) 5.5 Width: (cm) 1.3 Depth: (cm) 0.1 Area: (cm) 5.616 Volume: (cm) 0.562 % Reduction in Area: -11848.9% % Reduction in Volume: -11140% Epithelialization: Small (1-33%) Tunneling: No Undermining: No Wound Description Full Thickness Without Foul Odor Afte Classification: Exposed Support Structures Diabetic Severity Grade 2 (Wagner): Wound Margin: Thickened Exudate Amount: Large Exudate Type: Serous Exudate Color: amber Bigler, Vicktoria H. ([id number]) r Cleansing: No Wound Bed Granulation Amount: Medium (34-66%) Exposed Structure Granulation Quality:  Pink Fascia Exposed: No Necrotic Amount: Medium (34-66%) Fat Layer Exposed: No Necrotic Quality: Eschar, Adherent Slough Tendon Exposed: No Muscle Exposed: No Joint Exposed: No Bone Exposed: No Limited to Skin Breakdown Periwound Skin Texture Texture Color No Abnormalities Noted: No No Abnormalities Noted: No Callus: No Atrophie Blanche: No Crepitus: No Cyanosis: No Excoriation: No Ecchymosis: No Fluctuance: No Erythema: No Friable: No Hemosiderin Staining: No Induration: No Mottled: No Localized Edema: No Pallor: No Rash: No Rubor: No Scarring: Yes  Temperature / Pain Moisture Temperature: No Abnormality No Abnormalities Noted: No Tenderness on Palpation: Yes Dry / Scaly: No Maceration: No Moist: Yes Wound Preparation Ulcer Cleansing: Other: soap and water, Topical Anesthetic Applied: Other: lidocaine 4%, Treatment Notes Wound #1 (Right, Anterior Lower Leg) 1. Cleansed with: Cleanse wound with antibacterial soap and water 2. Anesthetic Topical Lidocaine 4% cream to wound bed prior to debridement 3. Peri-wound Care: Barrier cream 4. Dressing Applied: Iodoflex 5. Secondary Dressing Applied ABD Pad 7. Secured with Tape Mckercher, Clydine H. ([id number]) 3 Layer Compression System - Bilateral Electronic Signature(s) Signed: 08/29/2015 4:05:48 PM By: Alejandro Mulling Entered By: Alejandro Mulling on 08/29/2015 08:33:46 Bakke, Avari H. ([id number]) -------------------------------------------------------------------------------- Wound Assessment Details Patient Name: Herbison, Kilynn H. Date of Service: 08/29/2015 8:00 AM Medical Record Patient Account Number: [id number] Number: Treating RN: Phillis Haggis [birth year] (59 y.o. Other Clinician: Date of Birth/Sex: Female) Treating ROBSON, MICHAEL Primary Care Physician/Extender: Comer Locket Physician: Referring Physician: Jones Broom in Treatment: 30 Wound Status Wound  Number: 3 Primary Etiology: Venous Leg Ulcer Wound Location: Left Lower Leg - Lateral Wound Status: Open Wounding Event: Gradually Appeared Comorbid History: Hypertension, Type II Diabetes Date Acquired: 01/09/2015 Weeks Of Treatment: 30 Clustered Wound: No Photos Photo Uploaded By: Alejandro Mulling on 08/29/2015 15:25:16 Wound Measurements Length: (cm) 11 Width: (cm) 3.5 Depth: (cm) 0.2 Area: (cm) 30.238 Volume: (cm) 6.048 % Reduction in Area: -175% % Reduction in Volume: -449.8% Epithelialization: Small (1-33%) Tunneling: No Undermining: No Wound Description Full Thickness Without Foul Odor Afte Classification: Exposed Support Structures Diabetic Severity Grade 1 (Wagner): Wound Margin: Flat and Intact Exudate Amount: Large Exudate Type: Serous Exudate Color: amber Braddock, Talyia H. ([id number]) r Cleansing: No Wound Bed Granulation Amount: Small (1-33%) Exposed Structure Granulation Quality: Red Fascia Exposed: No Necrotic Amount: Large (67-100%) Fat Layer Exposed: No Necrotic Quality: Adherent Slough Tendon Exposed: No Muscle Exposed: No Joint Exposed: No Bone Exposed: No Limited to Skin Breakdown Periwound Skin Texture Texture Color No Abnormalities Noted: No No Abnormalities Noted: No Callus: No Atrophie Blanche: No Crepitus: No Cyanosis: No Excoriation: No Ecchymosis: No Fluctuance: No Erythema: No Friable: No Hemosiderin Staining: No Induration: No Mottled: No Localized Edema: No Pallor: No Rash: No Rubor: No Scarring: Yes Temperature / Pain Moisture Temperature: No Abnormality No Abnormalities Noted: No Tenderness on Palpation: Yes Dry / Scaly: No Maceration: No Moist: Yes Wound Preparation Ulcer Cleansing: Other: soap and water, Topical Anesthetic Applied: Other: lidocaine 4%, Treatment Notes Wound #3 (Left, Lateral Lower Leg) 1. Cleansed with: Cleanse wound with antibacterial soap and water 2. Anesthetic Topical  Lidocaine 4% cream to wound bed prior to debridement 3. Peri-wound Care: Barrier cream 4. Dressing Applied: Iodoflex 5. Secondary Dressing Applied ABD Pad 7. Secured with Tape Fletchall, Amzie H. ([id number]) 3 Layer Compression System - Bilateral Electronic Signature(s) Signed: 08/29/2015 4:05:48 PM By: Alejandro Mulling Entered By: Alejandro Mulling on 08/29/2015 08:31:50 [place], Tanayia H. ([id number]) -------------------------------------------------------------------------------- Wound Assessment Details Patient Name: Ketchem, Dinna H. Date of Service: 08/29/2015 8:00 AM Medical Record Patient Account Number: [id number] Number: Treating RN: Phillis Haggis 12/18/1955 (59 y.o. Other Clinician: Date of Birth/Sex: Female) Treating ROBSON, MICHAEL Primary Care Physician/Extender: Comer Locket Physician: Referring Physician: Jones Broom in Treatment: 30 Wound Status Wound Number: 8 Primary Etiology: Venous Leg Ulcer Wound Location: Right Lower Leg - Posterior Wound Status: Open Wounding Event: Gradually Appeared Comorbid History: Hypertension, Type II Diabetes Date Acquired: 03/27/2015 Weeks Of Treatment: 21 Clustered Wound: No Photos  Photo Uploaded By: Alejandro Mulling on 08/29/2015 15:25:28 Wound Measurements Length: (cm) 5 Width: (cm) 2 Depth: (cm) 0.2 Area: (cm) 7.854 Volume: (cm) 1.571 % Reduction in Area: -344.5% % Reduction in Volume: -787.6% Epithelialization: Small (1-33%) Tunneling: No Wound Description Full Thickness Without Foul Odor Afte Classification: Exposed Support Structures Diabetic Severity Grade 1 (Wagner): Wound Margin: Flat and Intact Exudate Amount: Large Exudate Type: Serous Exudate Color: amber Ranta, Marijose H. ([id number]) r Cleansing: No Wound Bed Granulation Amount: Medium (34-66%) Exposed Structure Granulation Quality: Red, Hyper-granulation Fascia Exposed: No Necrotic Amount: Medium  (34-66%) Fat Layer Exposed: No Necrotic Quality: Eschar, Adherent Slough Tendon Exposed: No Muscle Exposed: No Joint Exposed: No Bone Exposed: No Limited to Skin Breakdown Periwound Skin Texture Texture Color No Abnormalities Noted: No No Abnormalities Noted: No Callus: No Atrophie Blanche: No Crepitus: No Cyanosis: No Excoriation: No Ecchymosis: No Fluctuance: No Erythema: No Friable: No Hemosiderin Staining: No Induration: No Mottled: No Localized Edema: Yes Pallor: No Rash: No Rubor: No Scarring: No Temperature / Pain Moisture Temperature: No Abnormality No Abnormalities Noted: No Tenderness on Palpation: Yes Dry / Scaly: No Maceration: No Moist: Yes Wound Preparation Ulcer Cleansing: Other: soap and water, Topical Anesthetic Applied: Other: lidocaine 4%, Treatment Notes Wound #8 (Right, Posterior Lower Leg) 1. Cleansed with: Cleanse wound with antibacterial soap and water 2. Anesthetic Topical Lidocaine 4% cream to wound bed prior to debridement 3. Peri-wound Care: Barrier cream 4. Dressing Applied: Iodoflex 5. Secondary Dressing Applied ABD Pad 7. Secured with Tape Leavelle, Noriko H. ([id number]) 3 Layer Compression System - Bilateral Electronic Signature(s) Signed: 08/29/2015 4:05:48 PM By: Alejandro Mulling Entered By: Alejandro Mulling on 08/29/2015 08:34:30 [place]ham, [place] Rexene [name] ([id number]) -------------------------------------------------------------------------------- Vitals Details Patient Name: Scaletta, Danyela H. Date of Service: 08/29/2015 8:00 AM Medical Record Patient Account Number: [id number] Number: Treating RN: Phillis Haggis [birth year] (59 y.o. Other Clinician: Date of Birth/Sex: Female) Treating ROBSON, MICHAEL Primary Care Physician/Extender: Comer Locket Physician: Referring Physician: Jones Broom in Treatment: 30 Vital Signs Time Taken: 08:15 Temperature (F): 98.0 Height (in): 68 Pulse  (bpm): 79 Weight (lbs): 294 Respiratory Rate (breaths/min): 20 Body Mass Index (BMI): 44.7 Blood Pressure (mmHg): 158/80 Reference Range: 80 - 120 mg / dl Electronic Signature(s) Signed: 08/29/2015 4:05:48 PM By: Alejandro Mulling Entered By: Alejandro Mulling on 08/29/2015 08:17:34

## 2015-08-31 NOTE — Progress Notes (Signed)
BREN, BORYS ([id number]) Visit Report for 08/29/2015 Chief Complaint Document Details Patient Name: [name], [name]. Date of Service: 08/29/2015 8:00 AM Medical Record Patient Account Number: [id number] Number: Treating RN: Phillis Haggis 02/01/1956 (59 y.o. Other Clinician: Date of Birth/Sex: Female) Treating Raquelle Pietro Primary Care Physician/Extender: Comer Locket Physician: Referring Physician: Jones Broom in Treatment: 30 Information Obtained from: Patient Chief Complaint Chronic bilateral calf ulcers. Electronic Signature(s) Signed: 08/30/2015 5:04:49 PM By: Baltazar Najjar MD Entered By: Baltazar Najjar on 08/29/2015 09:20:50 Goda, [name] ([id number]) -------------------------------------------------------------------------------- Debridement Details Patient Name: [name], [name] Date of Service: 08/29/2015 8:00 AM Medical Record Patient Account Number: [id number] Number: Treating RN: Phillis Haggis Dec 02, 1955 (59 y.o. Other Clinician: Date of Birth/Sex: Female) Treating Germain Koopmann Primary Care Physician/Extender: Comer Locket Physician: Referring Physician: Jones Broom in Treatment: 30 Debridement Performed for Wound #1 Right,Anterior Lower Leg Assessment: Performed By: Physician Maxwell Caul, MD Debridement: Debridement Pre-procedure Yes Verification/Time Out Taken: Start Time: 08:42 Pain Control: Other : lidocaine 4% cream Level: Skin/Subcutaneous Tissue Total Area Debrided (L x 5.5 (cm) x 1.3 (cm) = 7.15 (cm) W): Tissue and other Viable, Non-Viable, Exudate, Fibrin/Slough, Subcutaneous material debrided: Instrument: Curette Bleeding: Minimum Hemostasis Achieved: Pressure End Time: 08:52 Procedural Pain: 0 Post Procedural Pain: 0 Response to Treatment: Procedure was tolerated well Post Debridement Measurements of Total Wound Length: (cm) 5.5 Width: (cm)  1.3 Depth: (cm) 0.2 Volume: (cm) 1.123 Post Procedure Diagnosis Same as Pre-procedure Electronic Signature(s) Signed: 08/29/2015 4:05:48 PM By: Alejandro Mulling Signed: 08/30/2015 5:04:49 PM By: Baltazar Najjar MD Entered By: Baltazar Najjar on 08/29/2015 09:19:45 [name], [name] ([id number]) [name], [name] ([id number]) -------------------------------------------------------------------------------- Debridement Details Patient Name: [name], [name] Date of Service: 08/29/2015 8:00 AM Medical Record Patient Account Number: [id number] Number: Treating RN: Phillis Haggis Oct 13, 1955 (59 y.o. Other Clinician: Date of Birth/Sex: Female) Treating Liara Holm Primary Care Physician/Extender: Comer Locket Physician: Referring Physician: Jones Broom in Treatment: 30 Debridement Performed for Wound #3 Left,Lateral Lower Leg Assessment: Performed By: Physician Maxwell Caul, MD Debridement: Debridement Pre-procedure Yes Verification/Time Out Taken: Start Time: 08:42 Pain Control: Other : lidocaine 4% cream Level: Skin/Subcutaneous Tissue Total Area Debrided (L x 11 (cm) x 3.5 (cm) = 38.5 (cm) W): Tissue and other Viable, Non-Viable, Exudate, Fibrin/Slough, Subcutaneous material debrided: Instrument: Curette Bleeding: Minimum Hemostasis Achieved: Pressure End Time: 08:52 Procedural Pain: 0 Post Procedural Pain: 0 Response to Treatment: Procedure was tolerated well Post Debridement Measurements of Total Wound Length: (cm) 11 Width: (cm) 3.5 Depth: (cm) 0.3 Volume: (cm) 9.071 Post Procedure Diagnosis Same as Pre-procedure Electronic Signature(s) Signed: 08/29/2015 4:05:48 PM By: Alejandro Mulling Signed: 08/30/2015 5:04:49 PM By: Baltazar Najjar MD Entered By: Baltazar Najjar on 08/29/2015 09:20:09 [name], [name] ([id number]) [name], [name]  ([id number]) -------------------------------------------------------------------------------- Debridement Details Patient Name: [name], [name] Date of Service: 08/29/2015 8:00 AM Medical Record Patient Account Number: [id number] Number: Treating RN: Phillis Haggis 02/23/1956 (59 y.o. Other Clinician: Date of Birth/Sex: Female) Treating Denys Salinger Primary Care Physician/Extender: Comer Locket Physician: Referring Physician: Jones Broom in Treatment: 30 Debridement Performed for Wound #8 Right,Posterior Lower Leg Assessment: Performed By: Physician Maxwell Caul, MD Debridement: Debridement Pre-procedure Yes Verification/Time Out Taken: Start Time: 08:42 Pain Control: Other : lidocaine 4% cream Level: Skin/Subcutaneous Tissue Total Area Debrided (L x 5 (cm) x 2 (cm) = 10 (cm) W): Tissue and other Viable, Non-Viable, Exudate, Fibrin/Slough, Subcutaneous  material debrided: Instrument: Curette Bleeding: Minimum Hemostasis Achieved: Pressure End Time: 08:52 Procedural Pain: 0 Post Procedural Pain: 0 Response to Treatment: Procedure was tolerated well Post Debridement Measurements of Total Wound Length: (cm) 5 Width: (cm) 2 Depth: (cm) 0.3 Volume: (cm) 2.356 Post Procedure Diagnosis Same as Pre-procedure Electronic Signature(s) Signed: 08/29/2015 4:05:48 PM By: Alejandro Mulling Signed: 08/30/2015 5:04:49 PM By: Baltazar Najjar MD Entered By: Baltazar Najjar on 08/29/2015 09:20:33 [name], [name] ([id number]) [name], [name] ([id number]) -------------------------------------------------------------------------------- HPI Details Patient Name: [name], [name] Date of Service: 08/29/2015 8:00 AM Medical Record Patient Account Number: [id number] Number: Treating RN: Phillis Haggis [birth year] (59 y.o. Other Clinician: Date of Birth/Sex: Female) Treating Denijah Karrer Primary Care Physician/Extender:  Comer Locket Physician: Referring Physician: Jones Broom in Treatment: 30 History of Present Illness HPI Description: Pleasant 60 year old with h/o DM (Hgb A1c 7.4 in Aug 2016), ESRD (on hemodialysis since July 2016). Presented to her PCP, Dr. Venora Maples, with BLE ulcers since June 2016. 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 Left lower extremity ultrasound in May 2016 showed no evidence for DVT. No assessment for venous insufficiency. Culture 04/19/2015 grew methicillin sensitive staph aureus (sensitive to tetracycline), Stenotrophomonas maltophilia, and Enterococcus faecalis. Completed course of doxycycline. Hospitalized in Nov 2016 for worsening cellulitis. Treated with IV antibiotics. No operative debridement or biopsy. Punch biopsy of left calf ulcer 06/28/2015 showed no evidence for malignancy. Bilateral lower extremity venous ultrasound 07/20/2015 showed no evidence for DVT or chronic venous insufficiency. Performing dressing changes with silver alginate. Tolerating Profore light bilaterally 3x/week. She returns to clinic and is without complaints. No significant pain. No fever or chills. Less drainage. 08/09/2015 -- details over the last several months noted. Continues to stay hemodialysis 3 times a week. Says she is improving slowly and the pain is much better 08/16/15; this patient has wounds on her bilateral lower extremities which is been present since June 2016. The exact etiology of this is not really clear although she was hospitalized in October for cellulitis. I'm not sure if this was felt to be bilateral. In any case she had a debridement last week. She is using Aquacel Ag. Per the nursing staff the wounds continue to do nicely 08/22/15; patient's wounds were covered in a surface eschar with surrounding slough . All of this underwent  a surgical debridement including slough over the actual wound bed removing subcutaneous tissue. The wounds on the right lateral leg and right lateral leg all have improved quite nicely. She has been using Aquacel Ag 08/29/15 patient has bilateral leg wounds which are probably largely venous insufficiency. She does not have infection. All the wounds underwent surgical debridement with a curette. This was to remove fibrinous surface slough and circumferential callus and nonviable subcutaneous tissue and skin. She tolerates this quite well. I changed her to Iodoflex last week. [facility] Electronic Signature(s) Signed: 08/30/2015 5:04:49 PM By: Baltazar Najjar MD Entered By: Baltazar Najjar on 08/29/2015 09:22:14 [name], [name] ([id number]) Rosendahl, [name] ([id number]) -------------------------------------------------------------------------------- Physical Exam Details Patient Name: [name], Ashiah H. Date of Service: 08/29/2015 8:00 AM Medical Record Patient Account Number: [id number] Number: Treating RN: Phillis Haggis [birth year] (59 y.o. Other Clinician: Date of Birth/Sex: Female) Treating Tore Carreker Primary Care Physician/Extender: Comer Locket Physician: Referring Physician: Jones Broom in Treatment: 30 Cardiovascular Pedal pulses palpable and strong  bilaterally.. Edema present in both extremities. Mild edema. Notes Wound exam; after debridement of the wounds which are on the left lateral and right lateral calves. These clean up quite nicely. The surface of the ones especially on the left seem improved. There is no evidence of surrounding infection. At some point there was felt to be a tinea infection here I don't really see it. She did have rapid injuries however on her bilateral legs. I increased her compression the Profore as last week I'll reduce that this week Electronic Signature(s) Signed:  08/30/2015 5:04:49 PM By: Baltazar Najjar MD Entered By: Baltazar Najjar on 08/29/2015 09:23:20 Fetters, [name] ([id number]) -------------------------------------------------------------------------------- Physician Orders Details Patient Name: Zoss, Pressley H. Date of Service: 08/29/2015 8:00 AM Medical Record Patient Account Number: [id number] Number: Treating RN: Phillis Haggis [birth year] (59 y.o. Other Clinician: Date of Birth/Sex: Female) Treating Chamberlain Steinborn Primary Care Physician/Extender: Comer Locket Physician: Referring Physician: Jones Broom in Treatment: 30 Verbal / Phone Orders: Yes Clinician: Pinkerton, Debi Read Back and Verified: Yes Diagnosis Coding Wound Cleansing Wound #1 Right,Anterior Lower Leg o Clean wound with Normal Saline. o Cleanse wound with mild soap and water o May Shower, gently pat wound dry prior to applying new dressing. Wound #3 Left,Lateral Lower Leg o Clean wound with Normal Saline. o Cleanse wound with mild soap and water o May Shower, gently pat wound dry prior to applying new dressing. Wound #8 Right,Posterior Lower Leg o Clean wound with Normal Saline. o Cleanse wound with mild soap and water o May Shower, gently pat wound dry prior to applying new dressing. Anesthetic Wound #1 Right,Anterior Lower Leg o Topical Lidocaine 4% cream applied to wound bed prior to debridement Wound #3 Left,Lateral Lower Leg o Topical Lidocaine 4% cream applied to wound bed prior to debridement Wound #8 Right,Posterior Lower Leg o Topical Lidocaine 4% cream applied to wound bed prior to debridement Skin Barriers/Peri-Wound Care Wound #1 Right,Anterior Lower Leg o Antifungal cream - please use on legs not on wounds o Barrier cream - please use barrier cream around wounds Wound #3 Left,Lateral Lower Leg o Antifungal cream o Barrier cream Dick, Kayani H. ([id number]) Wound #8  Right,Posterior Lower Leg o Antifungal cream o Barrier cream Primary Wound Dressing Wound #1 Right,Anterior Lower Leg o Iodoflex Wound #3 Left,Lateral Lower Leg o Iodoflex Wound #8 Right,Posterior Lower Leg o Iodoflex Secondary Dressing Wound #1 Right,Anterior Lower Leg o ABD pad - please add ABD pad to dressing o Dry Gauze Wound #3 Left,Lateral Lower Leg o ABD pad - please add ABD pad to dressing o Dry Gauze Wound #8 Right,Posterior Lower Leg o ABD pad - please add ABD pad to dressing o Dry Gauze Dressing Change Frequency Wound #1 Right,Anterior Lower Leg o Change Dressing Monday, Wednesday, Friday - Wednesday in wound care center Wound #3 Left,Lateral Lower Leg o Change Dressing Monday, Wednesday, Friday - Wednesday in wound care center Wound #8 Right,Posterior Lower Leg o Change Dressing Monday, Wednesday, Friday - Wednesday in wound care center Follow-up Appointments Wound #1 Right,Anterior Lower Leg o Return Appointment in 1 week. Wound #3 Left,Lateral Lower Leg o Return Appointment in 1 week. Wound #8 Right,Posterior Lower Leg o Return Appointment in 1 week. Casco, Isabella H. ([id number]) Edema Control Wound #1 Right,Anterior Lower Leg o 3 Layer Compression System - Right Lower Extremity o Elevate legs to the level of the heart and pump ankles as often as possible - Elevate legs at Dialysis Wound #3 Left,Lateral Lower  Leg o 3 Layer Compression System - Left Lower Extremity o Elevate legs to the level of the heart and pump ankles as often as possible - Elevate legs at Dialysis Wound #8 Right,Posterior Lower Leg o 3 Layer Compression System - Right Lower Extremity o Elevate legs to the level of the heart and pump ankles as often as possible - Elevate legs at Dialysis Off-Loading Wound #1 Right,Anterior Lower Leg o Turn and reposition every 2 hours Wound #3 Left,Lateral Lower Leg o Turn and reposition every 2  hours Wound #8 Right,Posterior Lower Leg o Turn and reposition every 2 hours Additional Orders / Instructions Wound #1 Right,Anterior Lower Leg o Activity as tolerated Wound #3 Left,Lateral Lower Leg o Activity as tolerated Wound #8 Right,Posterior Lower Leg o Activity as tolerated Home Health Wound #1 Right,Anterior Lower Leg o Continue Home Health Visits - [facility]: Monday, Wednesday and Friday **WRAP GOES FROM BASE OF TOES TO THREE FINGER WIDTHS BENEATH BEND IN KNEE. Use Unna to anchor. PLEASE PLACE FUNGAL CREAM ON PT. o Home Health Nurse may visit PRN to address patientos wound care needs. o FACE TO FACE ENCOUNTER: MEDICARE and MEDICAID PATIENTS: I certify that this patient is under my care and that I had a face-to-face encounter that meets the physician face-to-face encounter requirements with this patient on this date. The encounter with the patient was in whole or in part for the following MEDICAL CONDITION: (primary reason for Home Healthcare) MEDICAL NECESSITY: I certify, that based on my findings, NURSING services are a medically necessary home health service. HOME BOUND STATUS: I certify that my clinical findings Yett, Psalm H. ([id number]) support that this patient is homebound (i.e., Due to illness or injury, pt requires aid of supportive devices such as crutches, cane, wheelchairs, walkers, the use of special transportation or the assistance of another person to leave their place of residence. There is a normal inability to leave the home and doing so requires considerable and taxing effort. Other absences are for medical reasons / religious services and are infrequent or of short duration when for other reasons). o If current dressing causes regression in wound condition, may D/C ordered dressing product/s and apply Normal Saline Moist Dressing daily until next Wound Healing Center / Other MD appointment. Notify Wound Healing Center of regression in  wound condition at [phone]. o Please direct any NON-WOUND related issues/requests for orders to patient's Primary Care Physician Wound #3 Left,Lateral Lower Leg o Continue Home Health Visits - [facility]: Monday, Wednesday and Friday **WRAP GOES FROM BASE OF TOES TO THREE FINGER WIDTHS BENEATH BEND IN KNEE. o Home Health Nurse may visit PRN to address patientos wound care needs. o FACE TO FACE ENCOUNTER: MEDICARE and MEDICAID PATIENTS: I certify that this patient is under my care and that I had a face-to-face encounter that meets the physician face-to-face encounter requirements with this patient on this date. The encounter with the patient was in whole or in part for the following MEDICAL CONDITION: (primary reason for Home Healthcare) MEDICAL NECESSITY: I certify, that based on my findings, NURSING services are a medically necessary home health service. HOME BOUND STATUS: I certify that my clinical findings support that this patient is homebound (i.e., Due to illness or injury, pt requires aid of supportive devices such as crutches, cane, wheelchairs, walkers, the use of special transportation or the assistance of another person to leave their place of residence. There is a normal inability to leave the home and doing so requires considerable and taxing  effort. Other absences are for medical reasons / religious services and are infrequent or of short duration when for other reasons). o If current dressing causes regression in wound condition, may D/C ordered dressing product/s and apply Normal Saline Moist Dressing daily until next Wound Healing Center / Other MD appointment. Notify Wound Healing Center of regression in wound condition at [phone]. o Please direct any NON-WOUND related issues/requests for orders to patient's Primary Care Physician Wound #8 Right,Posterior Lower Leg o Continue Home Health Visits - [facility]: Monday, Wednesday and Friday **WRAP GOES  FROM BASE OF TOES TO THREE FINGER WIDTHS BENEATH BEND IN KNEE. o Home Health Nurse may visit PRN to address patientos wound care needs. o FACE TO FACE ENCOUNTER: MEDICARE and MEDICAID PATIENTS: I certify that this patient is under my care and that I had a face-to-face encounter that meets the physician face-to-face encounter requirements with this patient on this date. The encounter with the patient was in whole or in part for the following MEDICAL CONDITION: (primary reason for Home Healthcare) MEDICAL NECESSITY: I certify, that based on my findings, NURSING services are a medically necessary home health service. HOME BOUND STATUS: I certify that my clinical findings support that this patient is homebound (i.e., Due to illness or injury, pt requires aid of supportive devices such as crutches, cane, wheelchairs, walkers, the use of special transportation or the assistance of another person to leave their place of residence. There is a normal inability to leave the home and doing so requires considerable and taxing effort. Other absences are for medical reasons / religious services and are infrequent or of short duration when for other reasons). Lalley, Faline H. ([id number]) o If current dressing causes regression in wound condition, may D/C ordered dressing product/s and apply Normal Saline Moist Dressing daily until next Wound Healing Center / Other MD appointment. Notify Wound Healing Center of regression in wound condition at [phone]. o Please direct any NON-WOUND related issues/requests for orders to patient's Primary Care Physician Medications-please add to medication list. Wound #1 Right,Anterior Lower Leg o Other: - Antifungal Cream to bilateral lower legs, NOT ON WOUNDS Notes Please use ABD pad to protect legs pt has some new blisters that have appeared. Electronic Signature(s) Signed: 08/29/2015 4:05:48 PM By: Alejandro Mulling Signed: 08/30/2015 5:04:49 PM By:  Baltazar Najjar MD Entered By: Alejandro Mulling on 08/29/2015 09:17:57 Buckner, [name] ([id number]) -------------------------------------------------------------------------------- Problem List Details Patient Name: Mesler, Shalom H. Date of Service: 08/29/2015 8:00 AM Medical Record Patient Account Number: [id number] Number: Treating RN: Phillis Haggis [birth year] (59 y.o. Other Clinician: Date of Birth/Sex: Female) Treating Madylin Fairbank Primary Care Physician/Extender: Comer Locket Physician: Referring Physician: Jones Broom in Treatment: 30 Active Problems ICD-10 Encounter Code Description Active Date Diagnosis E11.622 Type 2 diabetes mellitus with other skin ulcer 01/30/2015 Yes N18.6 End stage renal disease 01/30/2015 Yes L97.222 Non-pressure chronic ulcer of left calf with fat layer 01/30/2015 Yes exposed L97.212 Non-pressure chronic ulcer of right calf with fat layer 01/30/2015 Yes exposed R60.0 Localized edema 04/19/2015 Yes I89.0 Lymphedema, not elsewhere classified 07/12/2015 Yes Inactive Problems Resolved Problems Electronic Signature(s) Signed: 08/30/2015 5:04:49 PM By: Baltazar Najjar MD Entered By: Baltazar Najjar on 08/29/2015 09:19:27 [place]underburg, [name] ([id number]) -------------------------------------------------------------------------------- Progress Note Details Patient Name: Hulett, Rachelann H. Date of Service: 08/29/2015 8:00 AM Medical Record Patient Account Number: [id number] Number: Treating RN: Phillis Haggis [birth year] (59 y.o. Other Clinician: Date of Birth/Sex: Female) Treating Kaiana Marion Primary Care Physician/Extender: Greg Cutter  Freida Busman Physician: Referring Physician: Jones Broom in Treatment: 30 Subjective Chief Complaint Information obtained from Patient Chronic bilateral calf ulcers. History of Present Illness (HPI) Pleasant 60 year old with h/o DM (Hgb A1c 7.4 in Aug  2016), ESRD (on hemodialysis since July 2016). Presented to her PCP, Dr. Venora Maples, with BLE ulcers since June 2016. 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 Left lower extremity ultrasound in May 2016 showed no evidence for DVT. No assessment for venous insufficiency. Culture 04/19/2015 grew methicillin sensitive staph aureus (sensitive to tetracycline), Stenotrophomonas maltophilia, and Enterococcus faecalis. Completed course of doxycycline. Hospitalized in Nov 2016 for worsening cellulitis. Treated with IV antibiotics. No operative debridement or biopsy. Punch biopsy of left calf ulcer 06/28/2015 showed no evidence for malignancy. Bilateral lower extremity venous ultrasound 07/20/2015 showed no evidence for DVT or chronic venous insufficiency. Performing dressing changes with silver alginate. Tolerating Profore light bilaterally 3x/week. She returns to clinic and is without complaints. No significant pain. No fever or chills. Less drainage. 08/09/2015 -- details over the last several months noted. Continues to stay hemodialysis 3 times a week. Says she is improving slowly and the pain is much better 08/16/15; this patient has wounds on her bilateral lower extremities which is been present since June 2016. The exact etiology of this is not really clear although she was hospitalized in October for cellulitis. I'm not sure if this was felt to be bilateral. In any case she had a debridement last week. She is using Aquacel Ag. Per the nursing staff the wounds continue to do nicely 08/22/15; patient's wounds were covered in a surface eschar with surrounding slough . All of this underwent a surgical debridement including slough over the actual wound bed removing subcutaneous tissue. The wounds on the right lateral leg and right lateral leg all have improved quite nicely. She has been  using Aquacel Ag 08/29/15 patient has bilateral leg wounds which are probably largely venous insufficiency. She does not have infection. All the wounds underwent surgical debridement with a curette. This was to remove fibrinous surface slough and circumferential callus and nonviable subcutaneous tissue and skin. She tolerates this quite well. I changed her to Iodoflex last week. [facility] are [facility] Smucker, Azeneth H. ([id number]) Objective Constitutional Vitals Time Taken: 8:15 AM, Height: 68 in, Weight: 294 lbs, BMI: 44.7, Temperature: 98.0 F, Pulse: 79 bpm, Respiratory Rate: 20 breaths/min, Blood Pressure: 158/80 mmHg. Cardiovascular Pedal pulses palpable and strong bilaterally.. Edema present in both extremities. Mild edema. General Notes: Wound exam; after debridement of the wounds which are on the left lateral and right lateral calves. These clean up quite nicely. The surface of the ones especially on the left seem improved. There is no evidence of surrounding infection. At some point there was felt to be a tinea infection here I don't really see it. She did have rapid injuries however on her bilateral legs. I increased her compression the Profore as last week I'll reduce that this week Integumentary (Hair, Skin) Wound #1 status is Open. Original cause of wound was Gradually Appeared. The wound is located on the Right,Anterior Lower Leg. The wound measures 5.5cm length x 1.3cm width x 0.1cm depth; 5.616cm^2 area and 0.562cm^3 volume. The wound is limited to skin breakdown. There is no tunneling or undermining noted. There is a large amount of serous drainage noted. The wound margin is thickened. There is medium (34-66%)  pink granulation within the wound bed. There is a medium (34-66%) amount of necrotic tissue within the wound bed including Eschar and Adherent Slough. The periwound skin appearance exhibited: Scarring, Moist. The periwound skin appearance  did not exhibit: Callus, Crepitus, Excoriation, Fluctuance, Friable, Induration, Localized Edema, Rash, Dry/Scaly, Maceration, Atrophie Blanche, Cyanosis, Ecchymosis, Hemosiderin Staining, Mottled, Pallor, Rubor, Erythema. Periwound temperature was noted as No Abnormality. The periwound has tenderness on palpation. Wound #3 status is Open. Original cause of wound was Gradually Appeared. The wound is located on the Left,Lateral Lower Leg. The wound measures 11cm length x 3.5cm width x 0.2cm depth; 30.238cm^2 area and 6.048cm^3 volume. The wound is limited to skin breakdown. There is no tunneling or undermining noted. There is a large amount of serous drainage noted. The wound margin is flat and intact. There is small (1-33%) red granulation within the wound bed. There is a large (67-100%) amount of necrotic tissue within the wound bed including Adherent Slough. The periwound skin appearance exhibited: Scarring, Moist. The periwound skin appearance did not exhibit: Callus, Crepitus, Excoriation, Fluctuance, Friable, Induration, Localized Edema, Rash, Dry/Scaly, Maceration, Atrophie Blanche, Cyanosis, Ecchymosis, Hemosiderin Staining, Mottled, Pallor, Rubor, Erythema. Periwound temperature was noted as No Abnormality. The periwound has tenderness on palpation. Wound #8 status is Open. Original cause of wound was Gradually Appeared. The wound is located on the Right,Posterior Lower Leg. The wound measures 5cm length x 2cm width x 0.2cm depth; 7.854cm^2 area and 1.571cm^3 volume. The wound is limited to skin breakdown. There is no tunneling noted. There is a large amount of serous drainage noted. The wound margin is flat and intact. There is medium (34-66%) red Regnier, Damiah H. ([id number]) granulation within the wound bed. There is a medium (34-66%) amount of necrotic tissue within the wound bed including Eschar and Adherent Slough. The periwound skin appearance exhibited: Localized Edema, Moist.  The periwound skin appearance did not exhibit: Callus, Crepitus, Excoriation, Fluctuance, Friable, Induration, Rash, Scarring, Dry/Scaly, Maceration, Atrophie Blanche, Cyanosis, Ecchymosis, Hemosiderin Staining, Mottled, Pallor, Rubor, Erythema. Periwound temperature was noted as No Abnormality. The periwound has tenderness on palpation. 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 There was a Skin/Subcutaneous Tissue Debridement ([id number]) debridement with total area of 7.15 sq cm performed by Maxwell Caul, MD. with the following instrument(s): Curette to remove Viable and Non-Viable tissue/material including Exudate, Fibrin/Slough, and Subcutaneous after achieving pain control using Other (lidocaine 4% cream). A time out was conducted prior to the start of the procedure. A Minimum amount of bleeding was controlled with Pressure. The procedure was tolerated well with a pain level of 0 throughout and a pain level of 0 following the procedure. Post Debridement Measurements: 5.5cm length x 1.3cm width x 0.2cm depth; 1.123cm^3 volume. Post procedure Diagnosis Wound #1: Same as Pre-Procedure Wound #3 Wound #3 is a Venous Leg Ulcer located on the Left,Lateral Lower Leg . There was a Skin/Subcutaneous Tissue Debridement ([id number]) debridement with total area of 38.5 sq cm performed by Maxwell Caul, MD. with the following instrument(s): Curette to remove Viable and Non-Viable tissue/material including Exudate, Fibrin/Slough, and Subcutaneous after achieving pain control using Other (lidocaine  4% cream). A time out was conducted prior to the start of the procedure. A Minimum amount of bleeding was controlled with Pressure. The procedure was tolerated well with a pain level of 0 throughout and a pain level of 0 following the procedure. Post Debridement Measurements: 11cm length x 3.5cm width x 0.3cm depth; 9.071cm^3 volume. Finfrock, Alylah HMarland Kitchen ([id number]) Post procedure Diagnosis Wound #3: Same as Pre-Procedure Wound #8 Wound #8 is a Venous Leg Ulcer located on the Right,Posterior Lower Leg . There was a Skin/Subcutaneous Tissue Debridement ([id number]) debridement with total area of 10 sq cm performed by Maxwell Caul, MD. with the following instrument(s): Curette to remove Viable and Non-Viable tissue/material including Exudate, Fibrin/Slough, and Subcutaneous after achieving pain control using Other (lidocaine 4% cream). A time out was conducted prior to the start of the procedure. A Minimum amount of bleeding was controlled with Pressure. The procedure was tolerated well with a pain level of 0 throughout and a pain level of 0 following the procedure. Post Debridement Measurements: 5cm length x 2cm width x 0.3cm depth; 2.356cm^3 volume. Post procedure Diagnosis Wound #8: Same as Pre-Procedure Plan Wound Cleansing: Wound #1 Right,Anterior Lower Leg: Clean wound with Normal Saline. Cleanse wound with mild soap and water May Shower, gently pat wound dry prior to applying new dressing. Wound #3 Left,Lateral Lower Leg: Clean wound with Normal Saline. Cleanse wound with mild soap and water May Shower, gently pat wound dry prior to applying new dressing. Wound #8 Right,Posterior Lower Leg: Clean wound with Normal Saline. Cleanse wound with mild soap and water May Shower, gently pat wound dry prior to applying new dressing. Anesthetic: Wound #1 Right,Anterior Lower Leg: Topical Lidocaine 4% cream applied to wound bed prior to debridement Wound #3 Left,Lateral Lower  Leg: Topical Lidocaine 4% cream applied to wound bed prior to debridement Wound #8 Right,Posterior Lower Leg: Topical Lidocaine 4% cream applied to wound bed prior to debridement Skin Barriers/Peri-Wound Care: Wound #1 Right,Anterior Lower Leg: Antifungal cream - please use on legs not on wounds Barrier cream - please use barrier cream around wounds Wound #3 Left,Lateral Lower Leg: Antifungal cream Barrier cream Wound #8 Right,Posterior Lower Leg: Antifungal cream Baum, Jamekia H. ([id number]) Barrier cream Primary Wound Dressing: Wound #1 Right,Anterior Lower Leg: Iodoflex Wound #3 Left,Lateral Lower Leg: Iodoflex Wound #8 Right,Posterior Lower Leg: Iodoflex Secondary Dressing: Wound #1 Right,Anterior Lower Leg: ABD pad - please add ABD pad to dressing Dry Gauze Wound #3 Left,Lateral Lower Leg: ABD pad - please add ABD pad to dressing Dry Gauze Wound #8 Right,Posterior Lower Leg: ABD pad - please add ABD pad to dressing Dry Gauze Dressing Change Frequency: Wound #1 Right,Anterior Lower Leg: Change Dressing Monday, Wednesday, Friday - Wednesday in wound care center Wound #3 Left,Lateral Lower Leg: Change Dressing Monday, Wednesday, Friday - Wednesday in wound care center Wound #8 Right,Posterior Lower Leg: Change Dressing Monday, Wednesday, Friday - Wednesday in wound care center Follow-up Appointments: Wound #1 Right,Anterior Lower Leg: Return Appointment in 1 week. Wound #3 Left,Lateral Lower Leg: Return Appointment in 1 week. Wound #8 Right,Posterior Lower Leg: Return Appointment in 1 week. Edema Control: Wound #1 Right,Anterior Lower Leg: 3 Layer Compression System - Right Lower Extremity Elevate legs to the level of the heart and pump ankles as often as possible - Elevate legs at Dialysis Wound #3 Left,Lateral Lower Leg: 3 Layer Compression System - Left Lower Extremity Elevate legs to the level of the heart and pump ankles as often as possible - Elevate  legs at Dialysis Wound #8 Right,Posterior Lower Leg: 3 Layer Compression System - Right Lower Extremity Elevate legs to the level of the heart and pump ankles as often as possible - Elevate legs at Dialysis Off-Loading: Wound #1 Right,Anterior Lower Leg: Turn and reposition every 2 hours Wound #3 Left,Lateral Lower Leg: Turn and reposition every 2 hours Wound #8 Right,Posterior Lower Leg: Turn and reposition every 2 hours Additional Orders / Instructions: Wound #1 Right,Anterior Lower Leg: Heming, Marinna H. ([id number]) Activity as tolerated Wound #3 Left,Lateral Lower Leg: Activity as tolerated Wound #8 Right,Posterior Lower Leg: Activity as tolerated Home Health: Wound #1 Right,Anterior Lower Leg: Continue Home Health Visits - [facility]: Monday, Wednesday and Friday **WRAP GOES FROM BASE OF TOES TO THREE FINGER WIDTHS BENEATH BEND IN KNEE. Use Unna to anchor. PLEASE PLACE FUNGAL CREAM ON PT. Home Health Nurse may visit PRN to address patient s wound care needs. FACE TO FACE ENCOUNTER: MEDICARE and MEDICAID PATIENTS: I certify that this patient is under my care and that I had a face-to-face encounter that meets the physician face-to-face encounter requirements with this patient on this date. The encounter with the patient was in whole or in part for the following MEDICAL CONDITION: (primary reason for Home Healthcare) MEDICAL NECESSITY: I certify, that based on my findings, NURSING services are a medically necessary home health service. HOME BOUND STATUS: I certify that my clinical findings support that this patient is homebound (i.e., Due to illness or injury, pt requires aid of supportive devices such as crutches, cane, wheelchairs, walkers, the use of special transportation or the assistance of another person to leave their place of residence. There is a normal inability to leave the home and doing so requires considerable and taxing effort. Other absences are for medical  reasons / religious services and are infrequent or of short duration when for other reasons). If current dressing causes regression in wound condition, may D/C ordered dressing product/s and apply Normal Saline Moist Dressing daily until next Wound Healing Center / Other MD appointment. Notify Wound Healing Center of regression in wound condition at [phone]. Please direct any NON-WOUND related issues/requests for orders to patient's Primary Care Physician Wound #3 Left,Lateral Lower Leg: Continue Home Health Visits - [facility]: Monday, Wednesday and Friday **WRAP GOES FROM BASE OF TOES TO THREE FINGER WIDTHS BENEATH BEND IN KNEE. Home Health Nurse may visit PRN to address patient s wound care needs. FACE TO FACE ENCOUNTER: MEDICARE and MEDICAID PATIENTS: I certify that this patient is under my care and that I had a face-to-face encounter that meets the physician face-to-face encounter requirements with this patient on this date. The encounter with the patient was in whole or in part for the following MEDICAL CONDITION: (primary reason for Home Healthcare) MEDICAL NECESSITY: I certify, that based on my findings, NURSING services are a medically necessary home health service. HOME BOUND STATUS: I certify that my clinical findings support that this patient is homebound (i.e., Due to illness or injury, pt requires aid of supportive devices such as crutches, cane, wheelchairs, walkers, the use of special transportation or the assistance of another person to leave their place of residence. There is a normal inability to leave the home and doing so requires considerable and taxing effort. Other absences are for medical reasons / religious services and are infrequent or of short duration when for other reasons). If current dressing causes regression in wound condition, may D/C ordered dressing product/s and apply Normal Saline Moist Dressing daily until next  Wound Healing Center / Other MD  appointment. Notify Wound Healing Center of regression in wound condition at [phone]. Please direct any NON-WOUND related issues/requests for orders to patient's Primary Care Physician Wound #8 Right,Posterior Lower Leg: Continue Home Health Visits - [facility]: Monday, Wednesday and Friday **WRAP GOES FROM BASE OF TOES TO THREE FINGER WIDTHS BENEATH BEND IN KNEE. Home Health Nurse may visit PRN to address patient s wound care needs. FACE TO FACE ENCOUNTER: MEDICARE and MEDICAID PATIENTS: I certify that this patient is under my care and that I had a face-to-face encounter that meets the physician face-to-face encounter requirements with this patient on this date. The encounter with the patient was in whole or in part for the following MEDICAL CONDITION: (primary reason for Home Healthcare) MEDICAL NECESSITY: I certify, Troia, Gioia H. ([id number]) that based on my findings, NURSING services are a medically necessary home health service. HOME BOUND STATUS: I certify that my clinical findings support that this patient is homebound (i.e., Due to illness or injury, pt requires aid of supportive devices such as crutches, cane, wheelchairs, walkers, the use of special transportation or the assistance of another person to leave their place of residence. There is a normal inability to leave the home and doing so requires considerable and taxing effort. Other absences are for medical reasons / religious services and are infrequent or of short duration when for other reasons). If current dressing causes regression in wound condition, may D/C ordered dressing product/s and apply Normal Saline Moist Dressing daily until next Wound Healing Center / Other MD appointment. Notify Wound Healing Center of regression in wound condition at [phone]. Please direct any NON-WOUND related issues/requests for orders to patient's Primary Care Physician Medications-please add to medication list.: Wound #1  Right,Anterior Lower Leg: Other: - Antifungal Cream to bilateral lower legs, NOT ON WOUNDS General Notes: Please use ABD pad to protect legs pt has some new blisters that have appeared. #1 we used Iodoflex based dressings to her entire wound area covered by ABD pads. #2 gauze to the areas that appear to be wrap injury #3 back down to Profore lites from [employer]) Signed: 08/30/2015 5:04:49 PM By: Baltazar Najjar MD Entered By: Baltazar Najjar on 08/29/2015 09:26:30 Leavell, [name] ([id number]) -------------------------------------------------------------------------------- SuperBill Details Patient Name: Gagen, Malessa H. Date of Service: 08/29/2015 Medical Record Patient Account Number: [id number] Number: Treating RN: Phillis Haggis February 23, 1956 (59 y.o. Other Clinician: Date of Birth/Sex: Female) Treating Araya Roel Primary Care Physician/Extender: Comer Locket Physician: Weeks in Treatment: 30 Referring Physician: Fidel Levy Diagnosis Coding ICD-10 Codes Code Description [phone] Type 2 diabetes mellitus with other skin ulcer N18.6 End stage renal disease L97.222 Non-pressure chronic ulcer of left calf with fat layer exposed L97.212 Non-pressure chronic ulcer of right calf with fat layer exposed R60.0 Localized edema I89.0 Lymphedema, not elsewhere classified Facility Procedures CPT4 Code: [id number] Description: 11042 - DEB SUBQ TISSUE 20 SQ CM/< ICD-10 Description Diagnosis E11.622 Type 2 diabetes mellitus with other skin ulcer Modifier: Quantity: 1 CPT4 Code: [id number] Description: 11045 - DEB SUBQ TISS EA ADDL 20CM ICD-10 Description Diagnosis E11.622 Type 2 diabetes mellitus with other skin ulcer Modifier: Quantity: 2 Physician Procedures CPT4 Code: [id number] Description: 11042 - WC PHYS SUBQ TISS 20 SQ CM ICD-10 Description Diagnosis E11.622 Type 2 diabetes mellitus with other skin ulcer Modifier: Quantity:  1 CPT4 Code: [id number] Aguado, PAMEL Description: 11045 - WC PHYS SUBQ TISS EA ADDL 20 CM ICD-10 Description Diagnosis E11.622 Type 2  diabetes mellitus with other skin ulcer A H. ([id number]) Modifier: Quantity: 2 Electronic Signature(s) Signed: 08/30/2015 5:04:49 PM By: Baltazar Najjar MD Entered By: Baltazar Najjar on 08/29/2015 09:27:12

## 2015-09-05 ENCOUNTER — Ambulatory Visit: Payer: BC Managed Care – PPO | Admitting: Family Medicine

## 2015-09-06 ENCOUNTER — Encounter: Payer: BC Managed Care – PPO | Admitting: Internal Medicine

## 2015-09-06 DIAGNOSIS — I87313 Chronic venous hypertension (idiopathic) with ulcer of bilateral lower extremity: Secondary | ICD-10-CM | POA: Diagnosis not present

## 2015-09-06 DIAGNOSIS — L97811 Non-pressure chronic ulcer of other part of right lower leg limited to breakdown of skin: Secondary | ICD-10-CM | POA: Diagnosis not present

## 2015-09-06 DIAGNOSIS — E11622 Type 2 diabetes mellitus with other skin ulcer: Secondary | ICD-10-CM | POA: Diagnosis not present

## 2015-09-06 DIAGNOSIS — L97821 Non-pressure chronic ulcer of other part of left lower leg limited to breakdown of skin: Secondary | ICD-10-CM | POA: Diagnosis not present

## 2015-09-07 NOTE — Progress Notes (Signed)
JAMEELA, MICHNA ([id number]) Visit Report for 09/06/2015 Chief Complaint Document Details Patient Name: [name], [name]. Date of Service: 09/06/2015 8:00 AM Medical Record Patient Account Number: [id number] Number: Treating RN: Phillis Haggis 1956-04-23 (60 y.o. Other Clinician: Date of Birth/Sex: Female) Treating ROBSON, MICHAEL Primary Care Physician/Extender: Comer Locket Physician: Referring Physician: Jones Broom in Treatment: 31 Information Obtained from: Patient Chief Complaint Chronic bilateral calf ulcers. Electronic Signature(s) Signed: 09/06/2015 5:19:28 PM By: Baltazar Najjar MD Entered By: Baltazar Najjar on 09/06/2015 09:28:21 Harville, [name] ([id number]) -------------------------------------------------------------------------------- Debridement Details Patient Name: Ugarte, Keyonni H. Date of Service: 09/06/2015 8:00 AM Medical Record Patient Account Number: [id number] Number: Treating RN: Phillis Haggis 09-13-1955 (60 y.o. Other Clinician: Date of Birth/Sex: Female) Treating ROBSON, MICHAEL Primary Care Physician/Extender: Comer Locket Physician: Referring Physician: Jones Broom in Treatment: 31 Debridement Performed for Wound #3 Left,Lateral Lower Leg Assessment: Performed By: Physician Maxwell Caul, MD Debridement: Debridement Pre-procedure Yes Verification/Time Out Taken: Start Time: 08:51 Pain Control: Other : lidocaine 4% cream Level: Skin/Subcutaneous Tissue Total Area Debrided (L x 7.7 (cm) x 3.5 (cm) = 26.95 (cm) W): Tissue and other Viable, Non-Viable, Exudate, Fibrin/Slough, Subcutaneous material debrided: Instrument: Curette Bleeding: Minimum Hemostasis Achieved: Pressure End Time: 08:58 Procedural Pain: 0 Post Procedural Pain: 0 Response to Treatment: Procedure was tolerated well Post Debridement Measurements of Total Wound Length: (cm) 7.7 Width: (cm)  3.5 Depth: (cm) 0.3 Volume: (cm) 6.35 Post Procedure Diagnosis Same as Pre-procedure Electronic Signature(s) Signed: 09/06/2015 5:19:28 PM By: Baltazar Najjar MD Signed: 09/06/2015 5:40:13 PM By: Alejandro Mulling Entered By: Baltazar Najjar on 09/06/2015 09:27:48 Grieco, [name] ([id number]) Rosenwald, Candiss H. ([id number]) -------------------------------------------------------------------------------- Debridement Details Patient Name: Wisinski, Alera H. Date of Service: 09/06/2015 8:00 AM Medical Record Patient Account Number: [id number] Number: Treating RN: Phillis Haggis 20-Apr-1956 (60 y.o. Other Clinician: Date of Birth/Sex: Female) Treating ROBSON, MICHAEL Primary Care Physician/Extender: Comer Locket Physician: Referring Physician: Jones Broom in Treatment: 31 Debridement Performed for Wound #8 Right,Posterior Lower Leg Assessment: Performed By: Physician Maxwell Caul, MD Debridement: Debridement Pre-procedure Yes Verification/Time Out Taken: Start Time: 08:51 Pain Control: Other : lidocaine 4% cream Level: Skin/Subcutaneous Tissue Total Area Debrided (L x 4 (cm) x 1 (cm) = 4 (cm) W): Tissue and other Viable, Non-Viable, Exudate, Fibrin/Slough, Subcutaneous material debrided: Instrument: Curette Bleeding: Minimum Hemostasis Achieved: Pressure End Time: 08:58 Procedural Pain: 0 Post Procedural Pain: 0 Response to Treatment: Procedure was tolerated well Post Debridement Measurements of Total Wound Length: (cm) 4 Width: (cm) 1 Depth: (cm) 0.2 Volume: (cm) 0.628 Post Procedure Diagnosis Same as Pre-procedure Electronic Signature(s) Signed: 09/06/2015 5:19:28 PM By: Baltazar Najjar MD Signed: 09/06/2015 5:40:13 PM By: Alejandro Mulling Entered By: Baltazar Najjar on 09/06/2015 09:28:01 Co, [name] ([id number]) Frazzini, Darline H.  ([id number]) -------------------------------------------------------------------------------- HPI Details Patient Name: Hutchinson, Dhwani H. Date of Service: 09/06/2015 8:00 AM Medical Record Patient Account Number: [id number] Number: Treating RN: Phillis Haggis [birth year] (60 y.o. Other Clinician: Date of Birth/Sex: Female) Treating ROBSON, MICHAEL Primary Care Physician/Extender: Comer Locket Physician: Referring Physician: Jones Broom in Treatment: 31 History of Present Illness HPI Description: Pleasant 60 year old with h/o DM (Hgb A1c 7.4 in Aug 2016), ESRD (on hemodialysis since July 2016). Presented to her PCP, Dr. Venora Maples, with BLE ulcers since June 2016. 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  Left lower extremity ultrasound in May 2016 showed no evidence for DVT. No assessment for venous insufficiency. Culture 04/19/2015 grew methicillin sensitive staph aureus (sensitive to tetracycline), Stenotrophomonas maltophilia, and Enterococcus faecalis. Completed course of doxycycline. Hospitalized in Nov 2016 for worsening cellulitis. Treated with IV antibiotics. No operative debridement or biopsy. Punch biopsy of left calf ulcer 06/28/2015 showed no evidence for malignancy. Bilateral lower extremity venous ultrasound 07/20/2015 showed no evidence for DVT or chronic venous insufficiency. Performing dressing changes with silver alginate. Tolerating Profore light bilaterally 3x/week. She returns to clinic and is without complaints. No significant pain. No fever or chills. Less drainage. 08/09/2015 -- details over the last several months noted. Continues to stay hemodialysis 3 times a week. Says she is improving slowly and the pain is much better 08/16/15; this patient has wounds on her bilateral lower extremities which is been present  since June 2016. The exact etiology of this is not really clear although she was hospitalized in October for cellulitis. I'm not sure if this was felt to be bilateral. In any case she had a debridement last week. She is using Aquacel Ag. Per the nursing staff the wounds continue to do nicely 08/22/15; patient's wounds were covered in a surface eschar with surrounding slough . All of this underwent a surgical debridement including slough over the actual wound bed removing subcutaneous tissue. The wounds on the right lateral leg and right lateral leg all have improved quite nicely. She has been using Aquacel Ag 08/29/15 patient has bilateral leg wounds which are probably largely venous insufficiency. She does not have infection. All the wounds underwent surgical debridement with a curette. This was to remove fibrinous surface slough and circumferential callus and nonviable subcutaneous tissue and skin. She tolerates this quite well. I changed her to Iodoflex last week. [facility] 09/06/15; her wounds continued to improve both in appearance and in volume. We had reduced her from a Profore to a Profore light last week due to blistering on her skin. The blistering is better but her edema control is not so good. She still has itchy excoriated scan which makes me wonder whether she might be reacting to the surface layer of the Profore wraps. Her wounds required surgical debridement but again both of them look better. PATRINA, ANDREAS ([id number]) Electronic Signature(s) Signed: 09/06/2015 5:19:28 PM By: Baltazar Najjar MD Entered By: Baltazar Najjar on 09/06/2015 11:91:47 [name] ([id number]) -------------------------------------------------------------------------------- Physical Exam Details Patient Name: Rostron, Shizue H. Date of Service: 09/06/2015 8:00 AM Medical Record Patient Account Number: [id number] Number: Treating RN:  Phillis Haggis 11-26-1955 (59 y.o. Other Clinician: Date of Birth/Sex: Female) Treating ROBSON, MICHAEL Primary Care Physician/Extender: Comer Locket Physician: Referring Physician: Jones Broom in Treatment: 31 Constitutional Supine Blood Pressure is within target range for patient.. Pulse regular and within target range for patient.. Temperature is normal and within the target range for the patient.. Cardiovascular Pedal pulses palpable and strong bilaterally.. Edema present in both extremities.. Notes Wound exam; areas are on her anterior lateral lower legs bilaterally. Healthy looking base of. Still requiring surgical debridement and remove fibrinous surface slough and nonviable circumferential skin and soft tissue. The wounds cleaned up quite nicely. There is no evidence of cellulitis. Her edema control today is not so good and the skin still looks irritated and excoriated. I am very doubtful that this represents a tinea infection however Electronic Signature(s) Signed: 09/06/2015 5:19:28 PM By: Baltazar Najjar MD Entered  By: Baltazar Najjar on 09/06/2015 09:31:00 Recker, [name] ([id number]) -------------------------------------------------------------------------------- Physician Orders Details Patient Name: Dunagan, Deah H. Date of Service: 09/06/2015 8:00 AM Medical Record Patient Account Number: [id number] Number: Treating RN: Phillis Haggis October 11, 1955 (59 y.o. Other Clinician: Date of Birth/Sex: Female) Treating ROBSON, MICHAEL Primary Care Physician/Extender: Comer Locket Physician: Referring Physician: Jones Broom in Treatment: 33 Verbal / Phone Orders: Yes Clinician: Ashok Cordia, Debi Read Back and Verified: Yes Diagnosis Coding Wound Cleansing Wound #1 Right,Anterior Lower Leg o Clean wound with Normal Saline. o Cleanse wound with mild soap and water o May Shower, gently pat wound dry prior to  applying new dressing. Wound #3 Left,Lateral Lower Leg o Clean wound with Normal Saline. o Cleanse wound with mild soap and water o May Shower, gently pat wound dry prior to applying new dressing. Wound #8 Right,Posterior Lower Leg o Clean wound with Normal Saline. o Cleanse wound with mild soap and water o May Shower, gently pat wound dry prior to applying new dressing. Anesthetic Wound #1 Right,Anterior Lower Leg o Topical Lidocaine 4% cream applied to wound bed prior to debridement Wound #3 Left,Lateral Lower Leg o Topical Lidocaine 4% cream applied to wound bed prior to debridement Wound #8 Right,Posterior Lower Leg o Topical Lidocaine 4% cream applied to wound bed prior to debridement Skin Barriers/Peri-Wound Care Wound #1 Right,Anterior Lower Leg o Antifungal cream - please use on legs not on wounds, and TCA o Barrier cream - please use barrier cream around wounds Wound #3 Left,Lateral Lower Leg o Antifungal cream - please use on legs not on wounds, and TCA o Barrier cream Camero, Graciela H. ([id number]) Wound #8 Right,Posterior Lower Leg o Antifungal cream - please use on legs not on wounds, and TCA o Barrier cream Primary Wound Dressing Wound #1 Right,Anterior Lower Leg o Iodoflex Wound #3 Left,Lateral Lower Leg o Iodoflex Wound #8 Right,Posterior Lower Leg o Iodoflex Secondary Dressing Wound #1 Right,Anterior Lower Leg o ABD pad - please add ABD pad to dressing o Dry Gauze Wound #3 Left,Lateral Lower Leg o ABD pad - please add ABD pad to dressing o Dry Gauze Wound #8 Right,Posterior Lower Leg o ABD pad - please add ABD pad to dressing o Dry Gauze Dressing Change Frequency Wound #1 Right,Anterior Lower Leg o Change dressing every week Wound #3 Left,Lateral Lower Leg o Change dressing every week Wound #8 Right,Posterior Lower Leg o Change dressing every week Follow-up Appointments Wound #1 Right,Anterior  Lower Leg o Return Appointment in 1 week. Wound #3 Left,Lateral Lower Leg o Return Appointment in 1 week. Wound #8 Right,Posterior Lower Leg o Return Appointment in 1 week. RENI, HAUSNER ([id number]) Edema Control Wound #1 Right,Anterior Lower Leg o Unna Boots Bilaterally o Elevate legs to the level of the heart and pump ankles as often as possible - Elevate legs at Dialysis Wound #3 Left,Lateral Lower Leg o Unna Boots Bilaterally o Elevate legs to the level of the heart and pump ankles as often as possible - Elevate legs at Dialysis Wound #8 Right,Posterior Lower Leg o Unna Boots Bilaterally o Elevate legs to the level of the heart and pump ankles as often as possible - Elevate legs at Dialysis Off-Loading Wound #1 Right,Anterior Lower Leg o Turn and reposition every 2 hours Wound #3 Left,Lateral Lower Leg o Turn and reposition every 2 hours Wound #8 Right,Posterior Lower Leg o Turn and reposition every 2 hours Additional Orders / Instructions Wound #1 Right,Anterior Lower Leg o Activity as  tolerated Wound #3 Left,Lateral Lower Leg o Activity as tolerated Wound #8 Right,Posterior Lower Leg o Activity as tolerated Home Health Wound #1 Right,Anterior Lower Leg o D/C Home Health Services - May discontinue home health pt is going to be be placed in bilateral unna boots and these are only to be changed weekly in the office. Wound #3 Left,Lateral Lower Leg o D/C Home Health Services - May discontinue home health pt is going to be be placed in bilateral unna boots and these are only to be changed weekly in the office. Wound #8 Right,Posterior Lower Leg o D/C Home Health Services - May discontinue home health pt is going to be be placed in bilateral unna boots and these are only to be changed weekly in the office. Segoviano, Aviona H. ([id number]) Medications-please add to medication list. Wound #1 Right,Anterior Lower Leg o Other: -  Antifungal Cream to bilateral lower legs, NOT ON WOUNDS, and TCA. Electronic Signature(s) Signed: 09/06/2015 5:19:28 PM By: Baltazar Najjar MD Signed: 09/06/2015 5:40:13 PM By: Alejandro Mulling Entered By: Alejandro Mulling on 09/06/2015 08:59:22 Asher, [name] ([id number]) -------------------------------------------------------------------------------- Problem List Details Patient Name: Goodreau, Clora H. Date of Service: 09/06/2015 8:00 AM Medical Record Patient Account Number: [id number] Number: Treating RN: Phillis Haggis [birth year] (59 y.o. 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 Date of Service: 09/06/2015 8:00 AM Medical Record Patient Account Number: [id number] Number: Treating RN: Phillis Haggis [birth year] (59 y.o. Other Clinician: Date of Birth/Sex: Female) Treating ROBSON, MICHAEL Primary Care Physician/Extender: Comer Locket Physician: Referring Physician: Jones Broom in Treatment:  31 Subjective Chief Complaint Information obtained from Patient Chronic bilateral calf ulcers. History of Present Illness (HPI) Pleasant 60 year old with h/o DM (Hgb A1c 7.4 in Aug 2016), ESRD (on hemodialysis since July 2016). Presented to her PCP, Dr. Venora Maples, with BLE ulcers since June 2016. 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 Left lower extremity ultrasound in May 2016 showed no evidence for DVT. No assessment for venous insufficiency. Culture 04/19/2015 grew methicillin sensitive staph aureus (sensitive to tetracycline), Stenotrophomonas maltophilia, and Enterococcus faecalis. Completed course of doxycycline. Hospitalized in Nov 2016 for worsening cellulitis. Treated with IV antibiotics. No operative debridement or biopsy. Punch biopsy of left calf ulcer 06/28/2015 showed no evidence for malignancy. Bilateral lower extremity venous ultrasound 07/20/2015 showed no evidence for DVT or chronic venous insufficiency. Performing dressing changes with silver alginate. Tolerating Profore light bilaterally 3x/week. She returns to clinic and is without complaints. No significant pain. No fever or chills. Less drainage. 08/09/2015 -- details over the last several months noted. Continues to stay hemodialysis 3 times a week. Says she is improving slowly and the pain is much better 08/16/15; this patient has wounds  on her bilateral lower extremities which is been present since June 2016. The exact etiology of this is not really clear although she was hospitalized in October for cellulitis. I'm not sure if this was felt to be bilateral. In any case she had a debridement last week. She is using Aquacel Ag. Per the nursing staff the wounds continue to do nicely 08/22/15; patient's wounds were covered in a surface eschar with surrounding slough . All of this underwent a surgical  debridement including slough over the actual wound bed removing subcutaneous tissue. The wounds on the right lateral leg and right lateral leg all have improved quite nicely. She has been using Aquacel Ag 08/29/15 patient has bilateral leg wounds which are probably largely venous insufficiency. She does not have infection. All the wounds underwent surgical debridement with a curette. This was to remove fibrinous surface slough and circumferential callus and nonviable subcutaneous tissue and skin. She tolerates this quite well. I changed her to Iodoflex last week. Dress[facility] are being [facility] Klutz, Kearah H. ([id number]) 09/06/15; her wounds continued to improve both in appearance and in volume. We had reduced her from a Profore to a Profore light last week due to blistering on her skin. The blistering is better but her edema control is not so good. She still has itchy excoriated scan which makes me wonder whether she might be reacting to the surface layer of the Profore wraps. Her wounds required surgical debridement but again both of them look better. Objective Constitutional Supine Blood Pressure is within target range for patient.. Pulse regular and within target range for patient.. Temperature is normal and within the target range for the patient.. Vitals Time Taken: 8:09 AM, Height: 68 in, Weight: 294 lbs, BMI: 44.7, Temperature: 97.7 F, Pulse: 88 bpm, Respiratory Rate: 20 breaths/min, Blood Pressure: 148/75 mmHg. Cardiovascular Pedal pulses palpable and strong bilaterally.. Edema present in both extremities.. General Notes: Wound exam; areas are on her anterior lateral lower legs bilaterally. Healthy looking base of. Still requiring surgical debridement and remove fibrinous surface slough and nonviable circumferential skin and soft tissue. The wounds cleaned up quite nicely. There is no evidence of cellulitis. Her edema control today is not so good and the skin  still looks irritated and excoriated. I am very doubtful that this represents a tinea infection however Integumentary (Hair, Skin) Wound #1 status is Open. Original cause of wound was Gradually Appeared. The wound is located on the Right,Anterior Lower Leg. The wound measures 0.5cm length x 0.5cm width x 0.1cm depth; 0.196cm^2 area and 0.02cm^3 volume. The wound is limited to skin breakdown. There is no tunneling or undermining noted. There is a large amount of serous drainage noted. The wound margin is thickened. There is medium (34-66%) pink granulation within the wound bed. There is a medium (34-66%) amount of necrotic tissue within the wound bed including Eschar and Adherent Slough. The periwound skin appearance exhibited: Scarring, Moist. The periwound skin appearance did not exhibit: Callus, Crepitus, Excoriation, Fluctuance, Friable, Induration, Localized Edema, Rash, Dry/Scaly, Maceration, Atrophie Blanche, Cyanosis, Ecchymosis, Hemosiderin Staining, Mottled, Pallor, Rubor, Erythema. Periwound temperature was noted as No Abnormality. The periwound has tenderness on palpation. Wound #3 status is Open. Original cause of wound was Gradually Appeared. The wound is located on the Left,Lateral Lower Leg. The wound measures 7.7cm length x 3.5cm width x 0.2cm depth; 21.166cm^2 area and 4.233cm^3 volume. The wound is limited to skin breakdown. There is no tunneling or undermining noted. There is a large  amount of serous drainage noted. The wound margin is flat and intact. There is small (1-33%) red granulation within the wound bed. There is a large (67-100%) amount of necrotic tissue within the wound bed including Adherent Slough. The periwound skin appearance exhibited: Scarring, Moist. The periwound skin appearance did not exhibit: Callus, Crepitus, Excoriation, Fluctuance, Friable, Yin, Angellina H. ([id number]) Induration, Localized Edema, Rash, Dry/Scaly, Maceration, Atrophie Blanche,  Cyanosis, Ecchymosis, Hemosiderin Staining, Mottled, Pallor, Rubor, Erythema. Periwound temperature was noted as No Abnormality. The periwound has tenderness on palpation. Wound #8 status is Open. Original cause of wound was Gradually Appeared. The wound is located on the Right,Posterior Lower Leg. The wound measures 4cm length x 1cm width x 0.1cm depth; 3.142cm^2 area and 0.314cm^3 volume. The wound is limited to skin breakdown. There is no tunneling or undermining noted. There is a large amount of serous drainage noted. The wound margin is flat and intact. There is medium (34-66%) red granulation within the wound bed. There is a medium (34-66%) amount of necrotic tissue within the wound bed including Eschar and Adherent Slough. The periwound skin appearance exhibited: Localized Edema, Moist. The periwound skin appearance did not exhibit: Callus, Crepitus, Excoriation, Fluctuance, Friable, Induration, Rash, Scarring, Dry/Scaly, Maceration, Atrophie Blanche, Cyanosis, Ecchymosis, Hemosiderin Staining, Mottled, Pallor, Rubor, Erythema. Periwound temperature was noted as No Abnormality. The periwound has tenderness on palpation. Assessment Active Problems ICD-10 E11.622 - Type 2 diabetes mellitus with other skin ulcer N18.6 - End stage renal disease L97.222 - Non-pressure chronic ulcer of left calf with fat layer exposed L97.212 - Non-pressure chronic ulcer of right calf with fat layer exposed R60.0 - Localized edema I89.0 - Lymphedema, not elsewhere classified Procedures Wound #3 Wound #3 is a Venous Leg Ulcer located on the Left,Lateral Lower Leg . There was a Skin/Subcutaneous Tissue Debridement ([id number]) debridement with total area of 26.95 sq cm performed by Maxwell Caul, MD. with the following instrument(s): Curette to remove Viable and Non-Viable tissue/material including Exudate, Fibrin/Slough, and Subcutaneous after achieving pain control using Other (lidocaine 4% cream).  A time out was conducted prior to the start of the procedure. A Minimum amount of bleeding was controlled with Pressure. The procedure was tolerated well with a pain level of 0 throughout and a pain level of 0 following the procedure. Post Debridement Measurements: 7.7cm length x 3.5cm width x 0.3cm depth; 6.35cm^3 volume. Post procedure Diagnosis Wound #3: Same as Pre-Procedure Wound #8 Boman, Leshae H. ([id number]) Wound #8 is a Venous Leg Ulcer located on the Right,Posterior Lower Leg . There was a Skin/Subcutaneous Tissue Debridement ([id number]) debridement with total area of 4 sq cm performed by Maxwell Caul, MD. with the following instrument(s): Curette to remove Viable and Non-Viable tissue/material including Exudate, Fibrin/Slough, and Subcutaneous after achieving pain control using Other (lidocaine 4% cream). A time out was conducted prior to the start of the procedure. A Minimum amount of bleeding was controlled with Pressure. The procedure was tolerated well with a pain level of 0 throughout and a pain level of 0 following the procedure. Post Debridement Measurements: 4cm length x 1cm width x 0.2cm depth; 0.628cm^3 volume. Post procedure Diagnosis Wound #8: Same as Pre-Procedure Plan Wound Cleansing: Wound #1 Right,Anterior Lower Leg: Clean wound with Normal Saline. Cleanse wound with mild soap and water May Shower, gently pat wound dry prior to applying new dressing. Wound #3 Left,Lateral Lower Leg: Clean wound with Normal Saline. Cleanse wound with mild soap and water May Shower, gently pat wound dry prior to  applying new dressing. Wound #8 Right,Posterior Lower Leg: Clean wound with Normal Saline. Cleanse wound with mild soap and water May Shower, gently pat wound dry prior to applying new dressing. Anesthetic: Wound #1 Right,Anterior Lower Leg: Topical Lidocaine 4% cream applied to wound bed prior to debridement Wound #3 Left,Lateral Lower Leg: Topical  Lidocaine 4% cream applied to wound bed prior to debridement Wound #8 Right,Posterior Lower Leg: Topical Lidocaine 4% cream applied to wound bed prior to debridement Skin Barriers/Peri-Wound Care: Wound #1 Right,Anterior Lower Leg: Antifungal cream - please use on legs not on wounds, and TCA Barrier cream - please use barrier cream around wounds Wound #3 Left,Lateral Lower Leg: Antifungal cream - please use on legs not on wounds, and TCA Barrier cream Wound #8 Right,Posterior Lower Leg: Antifungal cream - please use on legs not on wounds, and TCA Barrier cream Primary Wound Dressing: Wound #1 Right,Anterior Lower Leg: Iodoflex Filla, Leeya H. ([id number]) Wound #3 Left,Lateral Lower Leg: Iodoflex Wound #8 Right,Posterior Lower Leg: Iodoflex Secondary Dressing: Wound #1 Right,Anterior Lower Leg: ABD pad - please add ABD pad to dressing Dry Gauze Wound #3 Left,Lateral Lower Leg: ABD pad - please add ABD pad to dressing Dry Gauze Wound #8 Right,Posterior Lower Leg: ABD pad - please add ABD pad to dressing Dry Gauze Dressing Change Frequency: Wound #1 Right,Anterior Lower Leg: Change dressing every week Wound #3 Left,Lateral Lower Leg: Change dressing every week Wound #8 Right,Posterior Lower Leg: Change dressing every week Follow-up Appointments: Wound #1 Right,Anterior Lower Leg: Return Appointment in 1 week. Wound #3 Left,Lateral Lower Leg: Return Appointment in 1 week. Wound #8 Right,Posterior Lower Leg: Return Appointment in 1 week. Edema Control: Wound #1 Right,Anterior Lower Leg: Unna Boots Bilaterally Elevate legs to the level of the heart and pump ankles as often as possible - Elevate legs at Dialysis Wound #3 Left,Lateral Lower Leg: Unna Boots Bilaterally Elevate legs to the level of the heart and pump ankles as often as possible - Elevate legs at Dialysis Wound #8 Right,Posterior Lower Leg: Unna Boots Bilaterally Elevate legs to the level of the heart  and pump ankles as often as possible - Elevate legs at Dialysis Off-Loading: Wound #1 Right,Anterior Lower Leg: Turn and reposition every 2 hours Wound #3 Left,Lateral Lower Leg: Turn and reposition every 2 hours Wound #8 Right,Posterior Lower Leg: Turn and reposition every 2 hours Additional Orders / Instructions: Wound #1 Right,Anterior Lower Leg: Activity as tolerated Wound #3 Left,Lateral Lower Leg: Activity as tolerated Wound #8 Right,Posterior Lower Leg: Sweney, Stepheny H. ([id number]) Activity as tolerated Home Health: Wound #1 Right,Anterior Lower Leg: D/C Home Health Services - May discontinue home health pt is going to be be placed in bilateral unna boots and these are only to be changed weekly in the office. Wound #3 Left,Lateral Lower Leg: D/C Home Health Services - May discontinue home health pt is going to be be placed in bilateral unna boots and these are only to be changed weekly in the office. Wound #8 Right,Posterior Lower Leg: D/C Home Health Services - May discontinue home health pt is going to be be placed in bilateral unna boots and these are only to be changed weekly in the office. Medications-please add to medication list.: Wound #1 Right,Anterior Lower Leg: Other: - Antifungal Cream to bilateral lower legs, NOT ON WOUNDS, and TCA. o #1 we changed her to her boots today out of concern for some form of contact dermatitis #2 we mixed a combination of nystatin and triamcinolone on on  her legs. #3 we are going to try to keep the Unna boots on all week, discharged home health #4 continue the Iodoflex placed dressings to the wounds which seemed to help with recurrent fibrinous slough. #5 no evidence of infection or ischemia is seen Electronic Signature(s) Signed: 09/06/2015 5:19:28 PM By: Baltazar Najjar MD Entered By: Baltazar Najjar on 09/06/2015 09:32:46 Rens, [name]  ([id number]) -------------------------------------------------------------------------------- SuperBill Details Patient Name: Patnaude, Yaretzi H. Date of Service: 09/06/2015 Medical Record Patient Account Number: [id number] Number: Treating RN: Phillis Haggis Apr 14, 1956 (59 y.o. 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 R60.0 Localized edema I89.0 Lymphedema, not elsewhere classified Facility Procedures CPT4 Code: [id number] Description: 11042 - DEB SUBQ TISSUE 20 SQ CM/< ICD-10 Description Diagnosis L97.222 Non-pressure chronic ulcer of left calf with fat l Modifier: ayer exposed Quantity: 1 CPT4 Code: [id number] Description: 11045 - DEB SUBQ TISS EA ADDL 20CM ICD-10 Description Diagnosis L97.212 Non-pressure chronic ulcer of right calf with fat Modifier: layer exposed Quantity: 1 Physician Procedures CPT4 Code: [id number] Description: 11042 - WC PHYS SUBQ TISS 20 SQ CM ICD-10 Description Diagnosis L97.222 Non-pressure chronic ulcer of left calf with fat la Modifier: yer exposed Quantity: 1 CPT4 Code: [id number] [place], PAM Description: 11045 - WC PHYS SUBQ TISS EA ADDL 20 CM ICD-10 Description Diagnosis L97.212 Non-pressure chronic ulcer of right calf with fat l ELA H. ([id number]) Modifier: ayer exposed Quantity: 1 Electronic Signature(s) Signed: 09/06/2015 5:19:28 PM By: Baltazar Najjar MD Entered By: Baltazar Najjar on 09/06/2015 09:33:32

## 2015-09-07 NOTE — Progress Notes (Signed)
[name], [name] ([id number]) Visit Report for 09/06/2015 Arrival Information Details Patient Name: [name], [name]. Date of Service: 09/06/2015 8:00 AM Medical Record Patient Account Number: [id number] Number: Treating RN: Phillis Haggis October 27, 1955 (60 y.o. Other Clinician: Date of Birth/Sex: Female) Treating [name], [name] Primary Care Physician/Extender: Comer Locket Physician: Referring Physician: Jones Broom in Treatment: 31 Visit Information History Since Last Visit All ordered tests and consults were completed: No Patient Arrived: Ambulatory Added or deleted any medications: No Arrival Time: 08:08 Any new allergies or adverse reactions: No Accompanied By: self Had a fall or experienced change in No Transfer Assistance: None activities of daily living that may affect Patient Identification Verified: Yes risk of falls: Secondary Verification Process Yes Signs or symptoms of abuse/neglect since last No Completed: visito Patient Requires Transmission- No Hospitalized since last visit: No Based Precautions: Pain Present Now: No Patient Has Alerts: Yes Patient Alerts: DMII ABI [place] Bilateral Electronic Signature(s) Signed: 09/06/2015 5:40:13 PM By: Alejandro Mulling Entered By: Alejandro Mulling on 09/06/2015 08:09:38 Bruning, Karalynn Rexene Edison ([id number]) -------------------------------------------------------------------------------- Encounter Discharge Information Details Patient Name: [name], [name] Date of Service: 09/06/2015 8:00 AM Medical Record Patient Account Number: [id number] Number: Treating RN: Phillis Haggis [birth year] (60 y.o. Other Clinician: Date of Birth/Sex: Female) Treating [name], [name] Primary Care Physician/Extender: Comer Locket Physician: Referring Physician: Jones Broom in Treatment: 64 Encounter Discharge Information Items Discharge Pain Level: 0 Discharge Condition:  Stable Ambulatory Status: Ambulatory Discharge Destination: Home Transportation: Private Auto Accompanied By: self Schedule Follow-up Appointment: Yes Medication Reconciliation completed and provided to Patient/Care Yes Jachob Mcclean: Provided on Clinical Summary of Care: 09/06/2015 Form Type Recipient Paper Patient PM Electronic Signature(s) Signed: 09/06/2015 9:23:46 AM By: Gwenlyn Perking Entered By: Gwenlyn Perking on 09/06/2015 09:23:46 Allbaugh, Edell H. ([id number]) -------------------------------------------------------------------------------- Lower Extremity Assessment Details Patient Name: [name], [name] Date of Service: 09/06/2015 8:00 AM Medical Record Patient Account Number: [id number] Number: Treating RN: Phillis Haggis Aug 13, 1955 (60 y.o. Other Clinician: Date of Birth/Sex: Female) Treating [name], [name] Primary Care Physician/Extender: Comer Locket Physician: Referring Physician: Jones Broom in Treatment: 31 Edema Assessment Assessed: [Left: No] [Right: No] Edema: [Left: Yes] [Right: Yes] Calf Left: Right: Point of Measurement: 34 cm From Medial Instep 44 cm 42 cm Ankle Left: Right: Point of Measurement: 10 cm From Medial Instep 27 cm 25 cm Vascular Assessment Pulses: Posterior Tibial Dorsalis Pedis Palpable: [Left:Yes] [Right:Yes] Extremity colors, hair growth, and conditions: Extremity Color: [Left:Red] [Right:Red] Temperature of Extremity: [Left:Warm] [Right:Warm] Capillary Refill: [Left:< 3 seconds] [Right:< 3 seconds] Toe Nail Assessment Left: Right: Thick: No No Discolored: No No Deformed: No No Improper Length and Hygiene: No No Electronic Signature(s) Signed: 09/06/2015 5:40:13 PM By: Alejandro Mulling Entered By: Alejandro Mulling on 09/06/2015 08:28:25 Hlavac, Mariyana H. ([id number]) Grabill, Evangelina H. ([id number]) -------------------------------------------------------------------------------- Multi Wound  Chart Details Patient Name: [name], [name] Date of Service: 09/06/2015 8:00 AM Medical Record Patient Account Number: [id number] Number: Treating RN: Phillis Haggis 11-Nov-1955 (60 y.o. Other Clinician: Date of Birth/Sex: Female) Treating [name], [name] Primary Care Physician/Extender: Comer Locket Physician: Referring Physician: Jones Broom in Treatment: 31 Vital Signs Height(in): 68 Pulse(bpm): 88 Weight(lbs): 294 Blood Pressure 148/75 (mmHg): Body Mass Index(BMI): 45 Temperature(F): 97.7 Respiratory Rate 20 (breaths/min): Photos: [1:No Photos] [3:No Photos] [8:No Photos] Wound Location: [1:Right Lower Leg - Anterior Left Lower Leg - Lateral] [8:Right Lower Leg - Posterior] Wounding Event: [1:Gradually Appeared] [3:Gradually Appeared] [8:Gradually Appeared] Primary Etiology: [  1:Venous Leg Ulcer] [3:Venous Leg Ulcer] [8:Venous Leg Ulcer] Comorbid History: [1:Hypertension, Type II Diabetes] [3:Hypertension, Type II Diabetes] [8:Hypertension, Type II Diabetes] Date Acquired: [1:01/09/2015] [3:01/09/2015] [8:03/27/2015] Weeks of Treatment: [1:31] [3:31] [8:22] Wound Status: [1:Open] [3:Open] [8:Open] Measurements L x W x D 0.5x0.5x0.1 [3:7.7x3.5x0.2] [8:4x1x0.1] (cm) Area (cm) : [1:0.196] [3:21.166] [8:3.142] Volume (cm) : [1:0.02] [3:4.233] [8:0.314] % Reduction in Area: [1:-317.00%] [3:-92.50%] [8:-77.80%] % Reduction in Volume: -300.00% [3:-284.80%] [8:-77.40%] Classification: [1:Full Thickness Without Exposed Support Structures] [3:Full Thickness Without Exposed Support Structures] [8:Full Thickness Without Exposed Support Structures] HBO Classification: [1:Grade 2] [3:Grade 1] [8:Grade 1] Exudate Amount: [1:Large] [3:Large] [8:Large] Exudate Type: [1:Serous] [3:Serous] [8:Serous] Exudate Color: [1:amber] [3:amber] [8:amber] Wound Margin: [1:Thickened] [3:Flat and Intact] [8:Flat and Intact] Granulation Amount: [1:Medium (34-66%)]  [3:Small (1-33%)] [8:Medium (34-66%)] Granulation Quality: [1:Pink] [3:Red] [8:Red, Hyper-granulation] Necrotic Amount: [1:Medium (34-66%)] [3:Large (67-100%)] [8:Medium (34-66%)] Necrotic Tissue: Eschar, Adherent Slough Adherent [name], Adherent Slough Exposed Structures: Fascia: No Fascia: No Fascia: No Fat: No Fat: No Fat: No Tendon: No Tendon: No Tendon: No Muscle: No Muscle: No Muscle: No Joint: No Joint: No Joint: No Bone: No Bone: No Bone: No Limited to Skin Limited to Skin Limited to Skin Breakdown Breakdown Breakdown Epithelialization: Small (1-33%) Small (1-33%) Small (1-33%) Periwound Skin Texture: Scarring: Yes Scarring: Yes Edema: Yes Edema: No Edema: No Excoriation: No Excoriation: No Excoriation: No Induration: No Induration: No Induration: No Callus: No Callus: No Callus: No Crepitus: No Crepitus: No Crepitus: No Fluctuance: No Fluctuance: No Fluctuance: No Friable: No Friable: No Friable: No Rash: No Rash: No Rash: No Scarring: No Periwound Skin Moist: Yes Moist: Yes Moist: Yes Moisture: Maceration: No Maceration: No Maceration: No Dry/Scaly: No Dry/Scaly: No Dry/Scaly: No Periwound Skin Color: Atrophie Blanche: No Atrophie Blanche: No Atrophie Blanche: No Cyanosis: No Cyanosis: No Cyanosis: No Ecchymosis: No Ecchymosis: No Ecchymosis: No Erythema: No Erythema: No Erythema: No Hemosiderin Staining: No Hemosiderin Staining: No Hemosiderin Staining: No Mottled: No Mottled: No Mottled: No Pallor: No Pallor: No Pallor: No Rubor: No Rubor: No Rubor: No Temperature: No Abnormality No Abnormality No Abnormality Tenderness on Yes Yes Yes Palpation: Wound Preparation: Ulcer Cleansing: Other: Ulcer Cleansing: Other: Ulcer Cleansing: Other: soap and water soap and water soap and water Topical Anesthetic Topical Anesthetic Topical Anesthetic Applied: Other: lidocaine Applied: Other: lidocaine Applied: Other:  lidocaine 4% 4% 4% Treatment Notes Electronic Signature(s) Signed: 09/06/2015 5:40:13 PM By: Alejandro Mulling Entered By: Alejandro Mulling on 09/06/2015 08:34:11 Fosdick, [name] ([id number]) -------------------------------------------------------------------------------- Multi-Disciplinary Care Plan Details Patient Name: Gadson, Aletha H. Date of Service: 09/06/2015 8:00 AM Medical Record Patient Account Number: [id number] Number: Treating RN: Phillis Haggis Jul 08, 1956 (59 y.o. Other Clinician: Date of Birth/Sex: Female) Treating [name], [name] Primary Care Physician/Extender: Comer Locket Physician: Referring Physician: Jones Broom in Treatment: 54 Active Inactive Orientation to the Wound Care Program Nursing Diagnoses: Knowledge deficit related to the wound healing center program Goals: Patient/caregiver will verbalize understanding of the Wound Healing Center Program Date Initiated: 01/30/2015 Goal Status: Active Interventions: Provide education on orientation to the wound center Notes: Pain, Acute or Chronic Nursing Diagnoses: Pain, acute or chronic: actual or potential Goals: Patient will verbalize adequate pain control and receive pain control interventions during procedures as needed Date Initiated: 01/30/2015 Goal Status: Active Patient/caregiver will verbalize adequate pain control between visits Date Initiated: 01/30/2015 Goal Status: Active Interventions: Assess comfort goal upon admission Encourage patient to take pain medications as prescribed Notes: Baumbach, Henny H. ([id number]) Venous Leg Ulcer Nursing  Diagnoses: Potential for venous Insuffiency (use before diagnosis confirmed) Goals: Non-invasive venous studies are completed as ordered Date Initiated: 01/30/2015 Goal Status: Active Interventions: Assess peripheral edema status every visit. Notes: Wound/Skin Impairment Nursing Diagnoses: Impaired tissue  integrity Goals: Ulcer/skin breakdown will have a volume reduction of 30% by week 4 Date Initiated: 01/30/2015 Goal Status: Active Ulcer/skin breakdown will have a volume reduction of 50% by week 8 Date Initiated: 01/30/2015 Goal Status: Active Ulcer/skin breakdown will have a volume reduction of 80% by week 12 Date Initiated: 01/30/2015 Goal Status: Active Ulcer/skin breakdown will heal within 14 weeks Date Initiated: 01/30/2015 Goal Status: Active Interventions: Assess ulceration(s) every visit Notes: Electronic Signature(s) Signed: 09/06/2015 5:40:13 PM By: Alejandro Mulling Entered By: Alejandro Mulling on 09/06/2015 08:34:04 Mehlhoff, Tracye H. ([id number]) -------------------------------------------------------------------------------- Pain Assessment Details Patient Name: Nanney, Ziya H. Date of Service: 09/06/2015 8:00 AM Medical Record Patient Account Number: [id number] Number: Treating RN: Phillis Haggis 1956-05-11 (59 y.o. Other Clinician: Date of Birth/Sex: Female) Treating [name], [name] Primary Care Physician/Extender: Comer Locket Physician: Referring Physician: Jones Broom in Treatment: 31 Active Problems Location of Pain Severity and Description of Pain Patient Has Paino No Site Locations Pain Management and Medication Current Pain Management: Electronic Signature(s) Signed: 09/06/2015 5:40:13 PM By: Alejandro Mulling Entered By: Alejandro Mulling on 09/06/2015 08:09:43 Ranganathan, Ludean Rexene Edison ([id number]) -------------------------------------------------------------------------------- Patient/Caregiver Education Details Patient Name: Gamarra, Ettamae H. Date of Service: 09/06/2015 8:00 AM Medical Record Patient Account Number: [id number] Number: Treating RN: Phillis Haggis 04-04-1956 (59 y.o. Other Clinician: Date of Birth/Gender: Female) Treating [name], [name] Primary Care Physician/Extender: Comer Locket Physician: Weeks in Treatment: 31 Referring Physician: Fidel Levy Education Assessment Education Provided To: Patient Education Topics Provided Wound/Skin Impairment: Handouts: Other: do not get your wraps wet Methods: Demonstration, Explain/Verbal Responses: State content correctly Electronic Signature(s) Signed: 09/06/2015 5:40:13 PM By: Alejandro Mulling Entered By: Alejandro Mulling on 09/06/2015 09:01:27 Louis, Siani H. ([id number]) -------------------------------------------------------------------------------- Wound Assessment Details Patient Name: Hyser, Keina H. Date of Service: 09/06/2015 8:00 AM Medical Record Patient Account Number: [id number] Number: Treating RN: Phillis Haggis 1955/07/21 (59 y.o. Other Clinician: Date of Birth/Sex: Female) Treating [name], [name] Primary Care Physician/Extender: Comer Locket Physician: Referring Physician: Jones Broom in Treatment: 31 Wound Status Wound Number: 1 Primary Etiology: Venous Leg Ulcer Wound Location: Right Lower Leg - Anterior Wound Status: Open Wounding Event: Gradually Appeared Comorbid History: Hypertension, Type II Diabetes Date Acquired: 01/09/2015 Weeks Of Treatment: 31 Clustered Wound: No Photos Photo Uploaded By: Alejandro Mulling on 09/06/2015 16:53:32 Wound Measurements Length: (cm) 0.5 Width: (cm) 0.5 Depth: (cm) 0.1 Area: (cm) 0.196 Volume: (cm) 0.02 % Reduction in Area: -317% % Reduction in Volume: -300% Epithelialization: Small (1-33%) Tunneling: No Undermining: No Wound Description Full Thickness Without Foul Odor Afte Classification: Exposed Support Structures Diabetic Severity Grade 2 (Wagner): Wound Margin: Thickened Exudate Amount: Large Exudate Type: Serous Exudate Color: amber Bhattacharyya, Aariah H. ([id number]) r Cleansing: No Wound Bed Granulation Amount: Medium (34-66%) Exposed Structure Granulation Quality:  Pink Fascia Exposed: No Necrotic Amount: Medium (34-66%) Fat Layer Exposed: No Necrotic Quality: Eschar, Adherent Slough Tendon Exposed: No Muscle Exposed: No Joint Exposed: No Bone Exposed: No Limited to Skin Breakdown Periwound Skin Texture Texture Color No Abnormalities Noted: No No Abnormalities Noted: No Callus: No Atrophie Blanche: No Crepitus: No Cyanosis: No Excoriation: No Ecchymosis: No Fluctuance: No Erythema: No Friable: No Hemosiderin Staining: No Induration: No Mottled: No Localized Edema: No Pallor: No Rash: No  Rubor: No Scarring: Yes Temperature / Pain Moisture Temperature: No Abnormality No Abnormalities Noted: No Tenderness on Palpation: Yes Dry / Scaly: No Maceration: No Moist: Yes Wound Preparation Ulcer Cleansing: Other: soap and water, Topical Anesthetic Applied: Other: lidocaine 4%, Treatment Notes Wound #1 (Right, Anterior Lower Leg) 1. Cleansed with: Cleanse wound with antibacterial soap and water 2. Anesthetic Topical Lidocaine 4% cream to wound bed prior to debridement 3. Peri-wound Care: Barrier cream 4. Dressing Applied: Other dressing (specify in notes) 5. Secondary Dressing Applied ABD Pad 7. Secured with Tape Heffern, Monteen H. ([id number]) Unna Boots Bilaterally Notes Iodoflex, TCA, Nystatin cream Electronic Signature(s) Signed: 09/06/2015 5:40:13 PM By: Alejandro Mulling Entered By: Alejandro Mulling on 09/06/2015 08:33:52 Lauricella, Kierah H. ([id number]) -------------------------------------------------------------------------------- Wound Assessment Details Patient Name: Dershem, Dusti H. Date of Service: 09/06/2015 8:00 AM Medical Record Patient Account Number: [id number] Number: Treating RN: Phillis Haggis 03-15-1956 (59 y.o. Other Clinician: Date of Birth/Sex: Female) Treating [name], [name] Primary Care Physician/Extender: Comer Locket Physician: Referring Physician: Jones Broom in Treatment: 31 Wound Status Wound Number: 3 Primary Etiology: Venous Leg Ulcer Wound Location: Left Lower Leg - Lateral Wound Status: Open Wounding Event: Gradually Appeared Comorbid History: Hypertension, Type II Diabetes Date Acquired: 01/09/2015 Weeks Of Treatment: 31 Clustered Wound: No Photos Photo Uploaded By: Alejandro Mulling on 09/06/2015 16:53:56 Wound Measurements Length: (cm) 7.7 Width: (cm) 3.5 Depth: (cm) 0.2 Area: (cm) 21.166 Volume: (cm) 4.233 % Reduction in Area: -92.5% % Reduction in Volume: -284.8% Epithelialization: Small (1-33%) Tunneling: No Undermining: No Wound Description Full Thickness Without Foul Odor Afte Classification: Exposed Support Structures Diabetic Severity Grade 1 (Wagner): Wound Margin: Flat and Intact Exudate Amount: Large Exudate Type: Serous Exudate Color: amber Dory, Estera H. ([id number]) r Cleansing: No Wound Bed Granulation Amount: Small (1-33%) Exposed Structure Granulation Quality: Red Fascia Exposed: No Necrotic Amount: Large (67-100%) Fat Layer Exposed: No Necrotic Quality: Adherent Slough Tendon Exposed: No Muscle Exposed: No Joint Exposed: No Bone Exposed: No Limited to Skin Breakdown Periwound Skin Texture Texture Color No Abnormalities Noted: No No Abnormalities Noted: No Callus: No Atrophie Blanche: No Crepitus: No Cyanosis: No Excoriation: No Ecchymosis: No Fluctuance: No Erythema: No Friable: No Hemosiderin Staining: No Induration: No Mottled: No Localized Edema: No Pallor: No Rash: No Rubor: No Scarring: Yes Temperature / Pain Moisture Temperature: No Abnormality No Abnormalities Noted: No Tenderness on Palpation: Yes Dry / Scaly: No Maceration: No Moist: Yes Wound Preparation Ulcer Cleansing: Other: soap and water, Topical Anesthetic Applied: Other: lidocaine 4%, Treatment Notes Wound #3 (Left, Lateral Lower Leg) 1. Cleansed with: Cleanse wound with  antibacterial soap and water 2. Anesthetic Topical Lidocaine 4% cream to wound bed prior to debridement 3. Peri-wound Care: Barrier cream 4. Dressing Applied: Other dressing (specify in notes) 5. Secondary Dressing Applied ABD Pad 7. Secured with Tape Lagrow, Rahma H. ([id number]) Unna Boots Bilaterally Notes Iodoflex, TCA, Nystatin cream Electronic Signature(s) Signed: 09/06/2015 5:40:13 PM By: Alejandro Mulling Entered By: Alejandro Mulling on 09/06/2015 08:30:03 Bogdan, Olena Rexene Edison ([id number]) -------------------------------------------------------------------------------- Wound Assessment Details Patient Name: Novell, Amilya H. Date of Service: 09/06/2015 8:00 AM Medical Record Patient Account Number: [id number] Number: Treating RN: Phillis Haggis 1956-05-11 (59 y.o. Other Clinician: Date of Birth/Sex: Female) Treating [name], [name] Primary Care Physician/Extender: Comer Locket Physician: Referring Physician: Jones Broom in Treatment: 31 Wound Status Wound Number: 8 Primary Etiology: Venous Leg Ulcer Wound Location: Right Lower Leg - Posterior Wound Status: Open Wounding Event: Gradually Appeared Comorbid  History: Hypertension, Type II Diabetes Date Acquired: 03/27/2015 Weeks Of Treatment: 22 Clustered Wound: No Photos Photo Uploaded By: Alejandro Mulling on 09/06/2015 16:53:56 Wound Measurements Length: (cm) 4 Width: (cm) 1 Depth: (cm) 0.1 Area: (cm) 3.142 Volume: (cm) 0.314 % Reduction in Area: -77.8% % Reduction in Volume: -77.4% Epithelialization: Small (1-33%) Tunneling: No Undermining: No Wound Description Full Thickness Without Foul Odor Afte Classification: Exposed Support Structures Diabetic Severity Grade 1 (Wagner): Wound Margin: Flat and Intact Exudate Amount: Large Exudate Type: Serous Exudate Color: amber Gerdts, Demyah H. ([id number]) r Cleansing: No Wound Bed Granulation Amount: Medium  (34-66%) Exposed Structure Granulation Quality: Red, Hyper-granulation Fascia Exposed: No Necrotic Amount: Medium (34-66%) Fat Layer Exposed: No Necrotic Quality: Eschar, Adherent Slough Tendon Exposed: No Muscle Exposed: No Joint Exposed: No Bone Exposed: No Limited to Skin Breakdown Periwound Skin Texture Texture Color No Abnormalities Noted: No No Abnormalities Noted: No Callus: No Atrophie Blanche: No Crepitus: No Cyanosis: No Excoriation: No Ecchymosis: No Fluctuance: No Erythema: No Friable: No Hemosiderin Staining: No Induration: No Mottled: No Localized Edema: Yes Pallor: No Rash: No Rubor: No Scarring: No Temperature / Pain Moisture Temperature: No Abnormality No Abnormalities Noted: No Tenderness on Palpation: Yes Dry / Scaly: No Maceration: No Moist: Yes Wound Preparation Ulcer Cleansing: Other: soap and water, Topical Anesthetic Applied: Other: lidocaine 4%, Treatment Notes Wound #8 (Right, Posterior Lower Leg) 1. Cleansed with: Cleanse wound with antibacterial soap and water 2. Anesthetic Topical Lidocaine 4% cream to wound bed prior to debridement 3. Peri-wound Care: Barrier cream 4. Dressing Applied: Other dressing (specify in notes) 5. Secondary Dressing Applied ABD Pad 7. Secured with Tape Demicco, Ruhi H. ([id number]) Unna Boots Bilaterally Notes Iodoflex, TCA, Nystatin cream Electronic Signature(s) Signed: 09/06/2015 5:40:13 PM By: Alejandro Mulling Entered By: Alejandro Mulling on 09/06/2015 08:32:42 Bautch, Detta Rexene Edison ([id number]) -------------------------------------------------------------------------------- Vitals Details Patient Name: South, Shekina H. Date of Service: 09/06/2015 8:00 AM Medical Record Patient Account Number: [id number] Number: Treating RN: Phillis Haggis 25-Feb-1956 (59 y.o. Other Clinician: Date of Birth/Sex: Female) Treating [name], [name] Primary Care Physician/Extender: Comer Locket Physician: Referring Physician: Jones Broom in Treatment: 31 Vital Signs Time Taken: 08:09 Temperature (F): 97.7 Height (in): 68 Pulse (bpm): 88 Weight (lbs): 294 Respiratory Rate (breaths/min): 20 Body Mass Index (BMI): 44.7 Blood Pressure (mmHg): 148/75 Reference Range: 80 - 120 mg / dl Electronic Signature(s) Signed: 09/06/2015 5:40:13 PM By: Alejandro Mulling Entered By: Alejandro Mulling on 09/06/2015 08:12:15

## 2015-09-11 DIAGNOSIS — D631 Anemia in chronic kidney disease: Secondary | ICD-10-CM | POA: Diagnosis not present

## 2015-09-12 ENCOUNTER — Encounter: Payer: BC Managed Care – PPO | Admitting: Internal Medicine

## 2015-09-12 DIAGNOSIS — Z992 Dependence on renal dialysis: Secondary | ICD-10-CM | POA: Diagnosis not present

## 2015-09-12 DIAGNOSIS — I87313 Chronic venous hypertension (idiopathic) with ulcer of bilateral lower extremity: Secondary | ICD-10-CM | POA: Diagnosis not present

## 2015-09-12 DIAGNOSIS — N186 End stage renal disease: Secondary | ICD-10-CM | POA: Diagnosis not present

## 2015-09-12 DIAGNOSIS — L97811 Non-pressure chronic ulcer of other part of right lower leg limited to breakdown of skin: Secondary | ICD-10-CM | POA: Diagnosis not present

## 2015-09-12 DIAGNOSIS — L97821 Non-pressure chronic ulcer of other part of left lower leg limited to breakdown of skin: Secondary | ICD-10-CM | POA: Diagnosis not present

## 2015-09-12 DIAGNOSIS — E1122 Type 2 diabetes mellitus with diabetic chronic kidney disease: Secondary | ICD-10-CM | POA: Diagnosis not present

## 2015-09-12 DIAGNOSIS — Z794 Long term (current) use of insulin: Secondary | ICD-10-CM | POA: Diagnosis not present

## 2015-09-12 DIAGNOSIS — Z79899 Other long term (current) drug therapy: Secondary | ICD-10-CM | POA: Diagnosis not present

## 2015-09-12 DIAGNOSIS — E11622 Type 2 diabetes mellitus with other skin ulcer: Secondary | ICD-10-CM | POA: Diagnosis not present

## 2015-09-13 NOTE — Progress Notes (Signed)
DANNISHA, ECKMANN ([id number]) Visit Report for 09/12/2015 Arrival Information Details Patient Name: JOCHEBED, BILLS. Date of Service: 09/12/2015 8:00 AM Medical Record Patient Account Number: [id number] Number: Treating RN: Phillis Haggis [birth year] (60 y.o. Other Clinician: Date of Birth/Sex: Female) Treating ROBSON, MICHAEL Primary Care Physician/Extender: Comer Locket Physician: Referring Physician: Jones Broom in Treatment: 32 Visit Information History Since Last Visit All ordered tests and consults were completed: No Patient Arrived: Ambulatory Added or deleted any medications: No Arrival Time: 08:07 Any new allergies or adverse reactions: No Accompanied By: self Had a fall or experienced change in No Transfer Assistance: None activities of daily living that may affect Patient Identification Verified: Yes risk of falls: Secondary Verification Process Yes Signs or symptoms of abuse/neglect since last No Completed: visito Patient Requires Transmission- No Hospitalized since last visit: No Based Precautions: Pain Present Now: No Patient Has Alerts: Yes Patient Alerts: DMII ABI Warren Bilateral Electronic Signature(s) Signed: 09/12/2015 4:47:05 PM By: Alejandro Mulling Entered By: Alejandro Mulling on 09/12/2015 08:07:52 Limb, Olimpia Rexene Edison ([id number]) -------------------------------------------------------------------------------- Encounter Discharge Information Details Patient Name: Axtell, Corinne H. Date of Service: 09/12/2015 8:00 AM Medical Record Patient Account Number: [id number] Number: Treating RN: Phillis Haggis 06/07/1956 (60 y.o. Other Clinician: Date of Birth/Sex: Female) Treating ROBSON, MICHAEL Primary Care Physician/Extender: Comer Locket Physician: Referring Physician: Jones Broom in Treatment: 11 Encounter Discharge Information Items Discharge Pain Level: 0 Discharge Condition:  Stable Ambulatory Status: Ambulatory Discharge Destination: Home Transportation: Private Auto Accompanied By: self Schedule Follow-up Appointment: Yes Medication Reconciliation completed and provided to Patient/Care Yes Jerilynn Feldmeier: Provided on Clinical Summary of Care: 09/12/2015 Form Type Recipient Paper Patient PM Electronic Signature(s) Signed: 09/12/2015 9:21:42 AM By: Gwenlyn Perking Entered By: Gwenlyn Perking on 09/12/2015 09:21:42 Purpura, Areta Rexene Edison ([id number]) -------------------------------------------------------------------------------- Lower Extremity Assessment Details Patient Name: Lanuza, Peytan H. Date of Service: 09/12/2015 8:00 AM Medical Record Patient Account Number: [id number] Number: Treating RN: Phillis Haggis [birth year] (60 y.o. Other Clinician: Date of Birth/Sex: Female) Treating ROBSON, MICHAEL Primary Care Physician/Extender: Comer Locket Physician: Referring Physician: Jones Broom in Treatment: 32 Edema Assessment Assessed: [Left: No] [Right: No] E[Left: dema] [Right: :] Calf Left: Right: Point of Measurement: cm From Medial Instep 42.5 cm 41 cm Ankle Left: Right: Point of Measurement: cm From Medial Instep 26.5 cm 25 cm Vascular Assessment Pulses: Posterior Tibial Dorsalis Pedis Palpable: [Left:Yes] [Right:Yes] Extremity colors, hair growth, and conditions: Extremity Color: [Left:Red] [Right:Red] Temperature of Extremity: [Left:Warm] [Right:Warm] Capillary Refill: [Left:< 3 seconds] [Right:< 3 seconds] Toe Nail Assessment Left: Right: Thick: No No Discolored: No No Deformed: No No Improper Length and Hygiene: No No Electronic Signature(s) Signed: 09/12/2015 4:47:05 PM By: Alejandro Mulling Entered By: Alejandro Mulling on 09/12/2015 08:29:05 Evers, Jaiden H. ([id number]) Wheeler, Cali H. ([id number]) -------------------------------------------------------------------------------- Multi Wound Chart  Details Patient Name: Graveline, Jancie H. Date of Service: 09/12/2015 8:00 AM Medical Record Patient Account Number: [id number] Number: Treating RN: Phillis Haggis May 08, 1956 (60 y.o. 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 Leg Ulcer] [  3:Venous Leg Ulcer] [8:Venous Leg Ulcer] Comorbid History: [1:Hypertension, Type II Diabetes] [3:Hypertension, Type II Diabetes] [8:Hypertension, Type II Diabetes] Date Acquired: [1:01/09/2015] [3:01/09/2015] [8:03/27/2015] Weeks of Treatment: [1:32] [3:32] [8:23] Wound Status: [1:Open] [3:Open] [8:Open] Measurements L x W x D 0.2x0.2x0.2 [3:8x2x0.2] [8:2.9x1x0.1] (cm) Area (cm) : [1:0.031] [3:12.566] [8:2.278] Volume (cm) : [1:0.006] [3:2.513] [8:0.228] % Reduction in Area: [1:34.00%] [3:-14.30%] [8:-28.90%] % Reduction in Volume: -20.00% [3:-128.50%] [8:-28.80%] Classification: [1:Full Thickness Without Exposed Support Structures] [3:Full Thickness Without Exposed Support Structures] [8:Full Thickness Without Exposed Support Structures] HBO Classification: [1:Grade 2] [3:Grade 1] [8:Grade 1] Exudate Amount: [1:Large] [3:Large] [8:Large] Exudate Type: [1:Serous] [3:Serosanguineous] [8:Serous] Exudate Color: [1:amber] [3:red, brown] [8:amber] Wound Margin: [1:Thickened] [3:Flat and Intact] [8:Flat and Intact] Granulation Amount: [1:Medium  (34-66%)] [3:Small (1-33%)] [8:Medium (34-66%)] Granulation Quality: [1:Pink] [3:Red] [8:Red, Hyper-granulation] Necrotic Amount: [1:Medium (34-66%)] [3:Large (67-100%)] [8:Medium (34-66%)] Necrotic Tissue: Eschar, Adherent Slough Adherent [name], Adherent Slough Exposed Structures: Fascia: No Fascia: No Fascia: No Fat: No Fat: No Fat: No Tendon: No Tendon: No Tendon: No Muscle: No Muscle: No Muscle: No Joint: No Joint: No Joint: No Bone: No Bone: No Bone: No Limited to Skin Limited to Skin Limited to Skin Breakdown Breakdown Breakdown Epithelialization: Small (1-33%) Small (1-33%) Small (1-33%) Periwound Skin Texture: Scarring: Yes Scarring: Yes Edema: Yes Edema: No Edema: No Excoriation: No Excoriation: No Excoriation: No Induration: No Induration: No Induration: No Callus: No Callus: No Callus: No Crepitus: No Crepitus: No Crepitus: No Fluctuance: No Fluctuance: No Fluctuance: No Friable: No Friable: No Friable: No Rash: No Rash: No Rash: No Scarring: No Periwound Skin Moist: Yes Moist: Yes Moist: Yes Moisture: Maceration: No Maceration: No Maceration: No Dry/Scaly: No Dry/Scaly: No Dry/Scaly: No Periwound Skin Color: Atrophie Blanche: No Atrophie Blanche: No Atrophie Blanche: No Cyanosis: No Cyanosis: No Cyanosis: No Ecchymosis: No Ecchymosis: No Ecchymosis: No Erythema: No Erythema: No Erythema: No Hemosiderin Staining: No Hemosiderin Staining: No Hemosiderin Staining: No Mottled: No Mottled: No Mottled: No Pallor: No Pallor: No Pallor: No Rubor: No Rubor: No Rubor: No Temperature: No Abnormality No Abnormality No Abnormality Tenderness on Yes Yes Yes Palpation: Wound Preparation: Ulcer Cleansing: Other: Ulcer Cleansing: Other: Ulcer Cleansing: Other: soap and water soap and water soap and water Topical Anesthetic Topical Anesthetic Topical Anesthetic Applied: Other: lidocaine Applied: Other: lidocaine Applied:  Other: lidocaine 4% 4% 4% Treatment Notes Electronic Signature(s) Signed: 09/12/2015 4:47:05 PM By: Alejandro Mulling Entered By: Alejandro Mulling on 09/12/2015 08:38:36 Oelkers, [name] ([id number]) -------------------------------------------------------------------------------- Multi-Disciplinary Care Plan Details Patient Name: Arreaga, Clemence H. Date of Service: 09/12/2015 8:00 AM Medical Record Patient Account Number: [id number] Number: Treating RN: Phillis Haggis March 07, 1956 (59 y.o. Other Clinician: Date of Birth/Sex: Female) Treating ROBSON, MICHAEL Primary Care Physician/Extender: Comer Locket Physician: Referring Physician: Jones Broom in Treatment: 63 Active Inactive Orientation to the Wound Care Program Nursing Diagnoses: Knowledge deficit related to the wound healing center program Goals: Patient/caregiver will verbalize understanding of the Wound Healing Center Program Date Initiated: 01/30/2015 Goal Status: Active Interventions: Provide education on orientation to the wound center Notes: Pain, Acute or Chronic Nursing Diagnoses: Pain, acute or chronic: actual or potential Goals: Patient will verbalize adequate pain control and receive pain control interventions during procedures as needed Date Initiated: 01/30/2015 Goal Status: Active Patient/caregiver will verbalize adequate pain control between visits Date Initiated: 01/30/2015 Goal Status: Active Interventions: Assess comfort goal upon admission Encourage patient to take pain medications as prescribed Notes: Kawecki, Jaislyn H. ([id number]) Venous Leg Ulcer Nursing Diagnoses: Potential  for venous Insuffiency (use before diagnosis confirmed) Goals: Non-invasive venous studies are completed as ordered Date Initiated: 01/30/2015 Goal Status: Active Interventions: Assess peripheral edema status every visit. Notes: Wound/Skin Impairment Nursing Diagnoses: Impaired tissue  integrity Goals: Ulcer/skin breakdown will have a volume reduction of 30% by week 4 Date Initiated: 01/30/2015 Goal Status: Active Ulcer/skin breakdown will have a volume reduction of 50% by week 8 Date Initiated: 01/30/2015 Goal Status: Active Ulcer/skin breakdown will have a volume reduction of 80% by week 12 Date Initiated: 01/30/2015 Goal Status: Active Ulcer/skin breakdown will heal within 14 weeks Date Initiated: 01/30/2015 Goal Status: Active Interventions: Assess ulceration(s) every visit Notes: Electronic Signature(s) Signed: 09/12/2015 4:47:05 PM By: Alejandro Mulling Entered By: Alejandro Mulling on 09/12/2015 08:38:28 Hackley, Delfina H. ([id number]) -------------------------------------------------------------------------------- Pain Assessment Details Patient Name: Thackeray, Shakeita H. Date of Service: 09/12/2015 8:00 AM Medical Record Patient Account Number: [id number] Number: Treating RN: Phillis Haggis Sep 07, 1955 (59 y.o. Other Clinician: Date of Birth/Sex: Female) Treating ROBSON, MICHAEL Primary Care Physician/Extender: Comer Locket Physician: Referring Physician: Jones Broom in Treatment: 40 Active Problems Location of Pain Severity and Description of Pain Patient Has Paino No Site Locations Pain Management and Medication Current Pain Management: Electronic Signature(s) Signed: 09/12/2015 4:47:05 PM By: Alejandro Mulling Entered By: Alejandro Mulling on 09/12/2015 08:09:45 Holstein, Nazirah Rexene Edison ([id number]) -------------------------------------------------------------------------------- Patient/Caregiver Education Details Patient Name: Hilleary, Dresden H. Date of Service: 09/12/2015 8:00 AM Medical Record Patient Account Number: [id number] Number: Treating RN: Phillis Haggis Mar 05, 1956 (59 y.o. Other Clinician: Date of Birth/Gender: Female) Treating ROBSON, MICHAEL Primary Care Physician/Extender: Comer Locket Physician: Weeks in Treatment: 32 Referring Physician: Fidel Levy Education Assessment Education Provided To: Patient Education Topics Provided Wound/Skin Impairment: Handouts: Other: do not get wraps wet Methods: Demonstration, Explain/Verbal Responses: State content correctly Electronic Signature(s) Signed: 09/12/2015 4:47:05 PM By: Alejandro Mulling Entered By: Alejandro Mulling on 09/12/2015 09:18:00 Keegan, Mannat H. ([id number]) -------------------------------------------------------------------------------- Wound Assessment Details Patient Name: Dolinar, Shelsea H. Date of Service: 09/12/2015 8:00 AM Medical Record Patient Account Number: [id number] Number: Treating RN: Phillis Haggis 08-17-1955 (59 y.o. Other Clinician: Date of Birth/Sex: Female) Treating ROBSON, MICHAEL Primary Care Physician/Extender: Comer Locket Physician: Referring Physician: Jones Broom in Treatment: 32 Wound Status Wound Number: 1 Primary Etiology: Venous Leg Ulcer Wound Location: Right Lower Leg - Anterior Wound Status: Open Wounding Event: Gradually Appeared Comorbid History: Hypertension, Type II Diabetes Date Acquired: 01/09/2015 Weeks Of Treatment: 32 Clustered Wound: No Photos Photo Uploaded By: Alejandro Mulling on 09/12/2015 16:25:10 Wound Measurements Length: (cm) 0.2 Width: (cm) 0.2 Depth: (cm) 0.2 Area: (cm) 0.031 Volume: (cm) 0.006 % Reduction in Area: 34% % Reduction in Volume: -20% Epithelialization: Small (1-33%) Tunneling: No Undermining: No Wound Description Full Thickness Without Foul Odor Afte Classification: Exposed Support Structures Diabetic Severity Grade 2 (Wagner): Wound Margin: Thickened Exudate Amount: Large Exudate Type: Serous Exudate Color: amber Polack, Nhu H. ([id number]) r Cleansing: No Wound Bed Granulation Amount: Medium (34-66%) Exposed Structure Granulation Quality: Pink Fascia  Exposed: No Necrotic Amount: Medium (34-66%) Fat Layer Exposed: No Necrotic Quality: Eschar, Adherent Slough Tendon Exposed: No Muscle Exposed: No Joint Exposed: No Bone Exposed: No Limited to Skin Breakdown Periwound Skin Texture Texture Color No Abnormalities Noted: No No Abnormalities Noted: No Callus: No Atrophie Blanche: No Crepitus: No Cyanosis: No Excoriation: No Ecchymosis: No Fluctuance: No Erythema: No Friable: No Hemosiderin Staining: No Induration: No Mottled: No Localized Edema: No Pallor: No Rash: No Rubor: No Scarring:  Yes Temperature / Pain Moisture Temperature: No Abnormality No Abnormalities Noted: No Tenderness on Palpation: Yes Dry / Scaly: No Maceration: No Moist: Yes Wound Preparation Ulcer Cleansing: Other: soap and water, Topical Anesthetic Applied: Other: lidocaine 4%, Treatment Notes Wound #1 (Right, Anterior Lower Leg) 1. Cleansed with: Cleanse wound with antibacterial soap and water 2. Anesthetic Topical Lidocaine 4% cream to wound bed prior to debridement 3. Peri-wound Care: Barrier cream 4. Dressing Applied: Iodoflex 5. Secondary Dressing Applied ABD Pad 7. Secured with Tape Revelle, Orvella H. ([id number]) Unna Boots Bilaterally Notes TCA ointment, Nystatin cream Electronic Signature(s) Signed: 09/12/2015 4:47:05 PM By: Alejandro Mulling Entered By: Alejandro Mulling on 09/12/2015 08:34:49 Fernicola, Vearl H. ([id number]) -------------------------------------------------------------------------------- Wound Assessment Details Patient Name: Cornia, Brianna H. Date of Service: 09/12/2015 8:00 AM Medical Record Patient Account Number: [id number] Number: Treating RN: Phillis Haggis 1955/08/31 (59 y.o. Other Clinician: Date of Birth/Sex: Female) Treating ROBSON, MICHAEL Primary Care Physician/Extender: Comer Locket Physician: Referring Physician: Jones Broom in Treatment: 32 Wound  Status Wound Number: 3 Primary Etiology: Venous Leg Ulcer Wound Location: Left Lower Leg - Lateral Wound Status: Open Wounding Event: Gradually Appeared Comorbid History: Hypertension, Type II Diabetes Date Acquired: 01/09/2015 Weeks Of Treatment: 32 Clustered Wound: No Photos Photo Uploaded By: Alejandro Mulling on 09/12/2015 16:25:11 Wound Measurements Length: (cm) 8 Width: (cm) 2 Depth: (cm) 0.2 Area: (cm) 12.566 Volume: (cm) 2.513 % Reduction in Area: -14.3% % Reduction in Volume: -128.5% Epithelialization: Small (1-33%) Tunneling: No Undermining: No Wound Description Full Thickness Without Exposed Foul Odor Classification: Support Structures Diabetic Severity Grade 1 (Wagner): Wound Margin: Flat and Intact Exudate Amount: Large Exudate Type: Serosanguineous Exudate Color: red, brown Puglia, Raevyn H. ([id number]) After Cleansing: No Wound Bed Granulation Amount: Small (1-33%) Exposed Structure Granulation Quality: Red Fascia Exposed: No Necrotic Amount: Large (67-100%) Fat Layer Exposed: No Necrotic Quality: Adherent Slough Tendon Exposed: No Muscle Exposed: No Joint Exposed: No Bone Exposed: No Limited to Skin Breakdown Periwound Skin Texture Texture Color No Abnormalities Noted: No No Abnormalities Noted: No Callus: No Atrophie Blanche: No Crepitus: No Cyanosis: No Excoriation: No Ecchymosis: No Fluctuance: No Erythema: No Friable: No Hemosiderin Staining: No Induration: No Mottled: No Localized Edema: No Pallor: No Rash: No Rubor: No Scarring: Yes Temperature / Pain Moisture Temperature: No Abnormality No Abnormalities Noted: No Tenderness on Palpation: Yes Dry / Scaly: No Maceration: No Moist: Yes Wound Preparation Ulcer Cleansing: Other: soap and water, Topical Anesthetic Applied: Other: lidocaine 4%, Treatment Notes Wound #3 (Left, Lateral Lower Leg) 1. Cleansed with: Cleanse wound with antibacterial soap and water 2.  Anesthetic Topical Lidocaine 4% cream to wound bed prior to debridement 3. Peri-wound Care: Barrier cream 4. Dressing Applied: Iodoflex 5. Secondary Dressing Applied ABD Pad 7. Secured with Tape Digiulio, Aury H. ([id number]) Unna Boots Bilaterally Notes TCA ointment, Nystatin cream Electronic Signature(s) Signed: 09/12/2015 4:47:05 PM By: Alejandro Mulling Entered By: Alejandro Mulling on 09/12/2015 08:31:56 Samarin, Amilah H. ([id number]) -------------------------------------------------------------------------------- Wound Assessment Details Patient Name: Nessel, Dorthey H. Date of Service: 09/12/2015 8:00 AM Medical Record Patient Account Number: [id number] Number: Treating RN: Phillis Haggis 1956-04-02 (59 y.o. Other Clinician: Date of Birth/Sex: Female) Treating ROBSON, MICHAEL Primary Care Physician/Extender: Comer Locket Physician: Referring Physician: Jones Broom in Treatment: 32 Wound Status Wound Number: 8 Primary Etiology: Venous Leg Ulcer Wound Location: Right Lower Leg - Posterior Wound Status: Open Wounding Event: Gradually Appeared Comorbid History: Hypertension, Type II Diabetes Date Acquired: 03/27/2015 Weeks Of Treatment:  23 Clustered Wound: No Photos Photo Uploaded By: Alejandro Mulling on 09/12/2015 16:25:29 Wound Measurements Length: (cm) 2.9 Width: (cm) 1 Depth: (cm) 0.1 Area: (cm) 2.278 Volume: (cm) 0.228 % Reduction in Area: -28.9% % Reduction in Volume: -28.8% Epithelialization: Small (1-33%) Tunneling: No Undermining: No Wound Description Full Thickness Without Foul Odor Afte Classification: Exposed Support Structures Diabetic Severity Grade 1 (Wagner): Wound Margin: Flat and Intact Exudate Amount: Large Exudate Type: Serous Exudate Color: amber Dearmas, Hibo H. ([id number]) r Cleansing: No Wound Bed Granulation Amount: Medium (34-66%) Exposed Structure Granulation Quality: Red,  Hyper-granulation Fascia Exposed: No Necrotic Amount: Medium (34-66%) Fat Layer Exposed: No Necrotic Quality: Eschar, Adherent Slough Tendon Exposed: No Muscle Exposed: No Joint Exposed: No Bone Exposed: No Limited to Skin Breakdown Periwound Skin Texture Texture Color No Abnormalities Noted: No No Abnormalities Noted: No Callus: No Atrophie Blanche: No Crepitus: No Cyanosis: No Excoriation: No Ecchymosis: No Fluctuance: No Erythema: No Friable: No Hemosiderin Staining: No Induration: No Mottled: No Localized Edema: Yes Pallor: No Rash: No Rubor: No Scarring: No Temperature / Pain Moisture Temperature: No Abnormality No Abnormalities Noted: No Tenderness on Palpation: Yes Dry / Scaly: No Maceration: No Moist: Yes Wound Preparation Ulcer Cleansing: Other: soap and water, Topical Anesthetic Applied: Other: lidocaine 4%, Treatment Notes Wound #8 (Right, Posterior Lower Leg) 1. Cleansed with: Cleanse wound with antibacterial soap and water 2. Anesthetic Topical Lidocaine 4% cream to wound bed prior to debridement 3. Peri-wound Care: Barrier cream 4. Dressing Applied: Iodoflex 5. Secondary Dressing Applied ABD Pad 7. Secured with Tape Stavola, Kinberly H. ([id number]) Unna Boots Bilaterally Notes TCA ointment, Nystatin cream Electronic Signature(s) Signed: 09/12/2015 4:47:05 PM By: Alejandro Mulling Entered By: Alejandro Mulling on 09/12/2015 08:36:02 Franzel, Elberta Rexene Edison ([id number]) -------------------------------------------------------------------------------- Vitals Details Patient Name: Dome, Antanasia H. Date of Service: 09/12/2015 8:00 AM Medical Record Patient Account Number: [id number] Number: Treating RN: Phillis Haggis May 06, 1956 (59 y.o. Other Clinician: Date of Birth/Sex: Female) Treating ROBSON, MICHAEL Primary Care Physician/Extender: Comer Locket Physician: Referring Physician: Jones Broom in Treatment:  32 Vital Signs Time Taken: 08:09 Temperature (F): 98.0 Height (in): 68 Pulse (bpm): 86 Weight (lbs): 294 Respiratory Rate (breaths/min): 20 Body Mass Index (BMI): 44.7 Blood Pressure (mmHg): 148/71 Reference Range: 80 - 120 mg / dl Electronic Signature(s) Signed: 09/12/2015 4:47:05 PM By: Alejandro Mulling Entered By: Alejandro Mulling on 09/12/2015 08:13:27

## 2015-09-13 NOTE — Progress Notes (Signed)
TAYEN, NARANG ([id number]) Visit Report for 09/12/2015 Chief Complaint Document Details Patient Name: LORANN, TANI. Date of Service: 09/12/2015 8:00 AM Medical Record Patient Account Number: [id number] Number: Treating RN: Phillis Haggis [birth year] (60 y.o. Other Clinician: Date of Birth/Sex: Female) Treating Treylen Gibbs Primary Care Physician/Extender: Comer Locket Physician: Referring Physician: Jones Broom in Treatment: 32 Information Obtained from: Patient Chief Complaint Chronic bilateral calf ulcers. Electronic Signature(s) Signed: 09/13/2015 8:09:37 AM By: Baltazar Najjar MD Entered By: Baltazar Najjar on 09/12/2015 08:55:57 Capri, [name] ([id number]) -------------------------------------------------------------------------------- Debridement Details Patient Name: Coopman, Ludy H. Date of Service: 09/12/2015 8:00 AM Medical Record Patient Account Number: [id number] Number: Treating RN: Phillis Haggis April 13, 1956 (60 y.o. Other Clinician: Date of Birth/Sex: Female) Treating Lis Savitt Primary Care Physician/Extender: Comer Locket Physician: Referring Physician: Jones Broom in Treatment: 32 Debridement Performed for Wound #3 Left,Lateral Lower Leg Assessment: Performed By: Physician Maxwell Caul, MD Debridement: Debridement Pre-procedure Yes Verification/Time Out Taken: Start Time: 08:45 Pain Control: Other : lidocaine 4% cream Level: Skin/Subcutaneous Tissue Total Area Debrided (L x 8 (cm) x 2 (cm) = 16 (cm) W): Tissue and other Viable, Non-Viable, Exudate, Fibrin/Slough, Subcutaneous material debrided: Instrument: Curette Bleeding: Minimum Hemostasis Achieved: Pressure End Time: 08:52 Procedural Pain: 0 Post Procedural Pain: 0 Response to Treatment: Procedure was tolerated well Post Debridement Measurements of Total Wound Length: (cm) 8 Width: (cm) 2 Depth:  (cm) 0.2 Volume: (cm) 2.513 Post Procedure Diagnosis Same as Pre-procedure Electronic Signature(s) Signed: 09/12/2015 4:47:05 PM By: Alejandro Mulling Signed: 09/13/2015 8:09:37 AM By: Baltazar Najjar MD Entered By: Baltazar Najjar on 09/12/2015 08:55:18 Vanhook, [name] ([id number]) Mcgrail, Ranessa H. ([id number]) -------------------------------------------------------------------------------- Debridement Details Patient Name: Metts, Tanisia H. Date of Service: 09/12/2015 8:00 AM Medical Record Patient Account Number: [id number] Number: Treating RN: Phillis Haggis 1956-06-10 (60 y.o. Other Clinician: Date of Birth/Sex: Female) Treating Alianna Wurster Primary Care Physician/Extender: Comer Locket Physician: Referring Physician: Jones Broom in Treatment: 32 Debridement Performed for Wound #8 Right,Posterior Lower Leg Assessment: Performed By: Physician Maxwell Caul, MD Debridement: Debridement Pre-procedure Yes Verification/Time Out Taken: Start Time: 08:45 Pain Control: Other : lidocaine 4% cream Level: Skin/Subcutaneous Tissue Total Area Debrided (L x 2.9 (cm) x 1 (cm) = 2.9 (cm) W): Tissue and other Viable, Non-Viable, Exudate, Fibrin/Slough, Subcutaneous material debrided: Instrument: Curette Bleeding: Minimum Hemostasis Achieved: Pressure End Time: 08:52 Procedural Pain: 0 Post Procedural Pain: 0 Response to Treatment: Procedure was tolerated well Post Debridement Measurements of Total Wound Length: (cm) 2.9 Width: (cm) 1 Depth: (cm) 0.1 Volume: (cm) 0.228 Post Procedure Diagnosis Same as Pre-procedure Electronic Signature(s) Signed: 09/12/2015 4:47:05 PM By: Alejandro Mulling Signed: 09/13/2015 8:09:37 AM By: Baltazar Najjar MD Entered By: Baltazar Najjar on 09/12/2015 08:55:38 Gries, [name] ([id number]) Mcluckie, Naijah H.  ([id number]) -------------------------------------------------------------------------------- HPI Details Patient Name: Westenberger, Davanee H. Date of Service: 09/12/2015 8:00 AM Medical Record Patient Account Number: [id number] Number: Treating RN: Phillis Haggis 12-10-1955 (60 y.o. Other Clinician: Date of Birth/Sex: Female) Treating Tylon Kemmerling Primary Care Physician/Extender: Comer Locket Physician: Referring Physician: Jones Broom in Treatment: 32 History of Present Illness HPI Description: Pleasant 60 year old with h/o DM (Hgb A1c 7.4 in Aug 2016), ESRD (on hemodialysis since July 2016). Presented to her PCP, Dr. Venora Maples, with BLE ulcers since June 2016. 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  Left lower extremity ultrasound in May 2016 showed no evidence for DVT. No assessment for venous insufficiency. Culture 04/19/2015 grew methicillin sensitive staph aureus (sensitive to tetracycline), Stenotrophomonas maltophilia, and Enterococcus faecalis. Completed course of doxycycline. Hospitalized in Nov 2016 for worsening cellulitis. Treated with IV antibiotics. No operative debridement or biopsy. Punch biopsy of left calf ulcer 06/28/2015 showed no evidence for malignancy. Bilateral lower extremity venous ultrasound 07/20/2015 showed no evidence for DVT or chronic venous insufficiency. Performing dressing changes with silver alginate. Tolerating Profore light bilaterally 3x/week. She returns to clinic and is without complaints. No significant pain. No fever or chills. Less drainage. 08/09/2015 -- details over the last several months noted. Continues to stay hemodialysis 3 times a week. Says she is improving slowly and the pain is much better 08/16/15; this patient has wounds on her bilateral lower extremities which is been present  since June 2016. The exact etiology of this is not really clear although she was hospitalized in October for cellulitis. I'm not sure if this was felt to be bilateral. In any case she had a debridement last week. She is using Aquacel Ag. Per the nursing staff the wounds continue to do nicely 08/22/15; patient's wounds were covered in a surface eschar with surrounding slough . All of this underwent a surgical debridement including slough over the actual wound bed removing subcutaneous tissue. The wounds on the right lateral leg and right lateral leg all have improved quite nicely. She has been using Aquacel Ag 08/29/15 patient has bilateral leg wounds which are probably largely venous insufficiency. She does not have infection. All the wounds underwent surgical debridement with a curette. This was to remove fibrinous surface slough and circumferential callus and nonviable subcutaneous tissue and skin. She tolerates this quite well. I changed her to Iodoflex last week. Dress[facility] 09/06/15; her wounds continued to improve both in appearance and in volume. We had reduced her from a Profore to a Profore light last week due to blistering on her skin. The blistering is better but her edema control is not so good. She still has itchy excoriated scan which makes me wonder whether she might be reacting to the surface layer of the Profore wraps. Her wounds required surgical debridement but again both of them look better. VENDETTA, PITTINGER ([id number]) 09/12/15 continued improvement in the appearance of the wounds undergoing a surgical debridement of circumferential callus, fibrinous surface slough and nonviable subcutaneous tissue. Her skin ears a lot better leading me to believe she had some form of contact dermatitis with the skin contact layer of both the Profore in Profore light wraps. Electronic Signature(s) Signed: 09/13/2015 8:09:37 AM By: Baltazar Najjar  MD Entered By: Baltazar Najjar on 09/12/2015 08:59:12 Gotham, [name] ([id number]) -------------------------------------------------------------------------------- Physical Exam Details Patient Name: Dechert, Tajia H. Date of Service: 09/12/2015 8:00 AM Medical Record Patient Account Number: [id number] Number: Treating RN: Phillis Haggis Feb 19, 1956 (59 y.o. Other Clinician: Date of Birth/Sex: Female) Treating Acey Woodfield Primary Care Physician/Extender: Comer Locket Physician: Referring Physician: Jones Broom in Treatment: 32 Constitutional Supine Blood Pressure is within target range for patient.Marland Kitchen Respirations regular, non-labored and within target range.. Temperature is normal and within the target range for the patient.. Cardiovascular Pedal pulses palpable and strong bilaterally.. Extremities are free of varicosities, clubbing or edema. Peripheral pulses strong and equal. Capillary refill < 3 seconds.. Notes Wound exam; the area on the left lateral and right lateral lower leg appeared to be  improving on both sides. Post-debridement healthy looking granulation with surrounding epithelialization. The skin on her legs looks a lot better. Edema control is adequate Electronic Signature(s) Signed: 09/13/2015 8:09:37 AM By: Baltazar Najjar MD Entered By: Baltazar Najjar on 09/12/2015 09:00:27 Tesoriero, [name] ([id number]) -------------------------------------------------------------------------------- Physician Orders Details Patient Name: Rybolt, Tahj H. Date of Service: 09/12/2015 8:00 AM Medical Record Patient Account Number: [id number] Number: Treating RN: Phillis Haggis November 08, 1955 (59 y.o. Other Clinician: Date of Birth/Sex: Female) Treating Samuel Mcpeek Primary Care Physician/Extender: Comer Locket Physician: Referring Physician: Jones Broom in Treatment: 57 Verbal / Phone Orders:  Yes Clinician: Ashok Cordia, Debi Read Back and Verified: Yes Diagnosis Coding Wound Cleansing Wound #1 Right,Anterior Lower Leg o Clean wound with Normal Saline. o Cleanse wound with mild soap and water o May Shower, gently pat wound dry prior to applying new dressing. Wound #3 Left,Lateral Lower Leg o Clean wound with Normal Saline. o Cleanse wound with mild soap and water o May Shower, gently pat wound dry prior to applying new dressing. Wound #8 Right,Posterior Lower Leg o Clean wound with Normal Saline. o Cleanse wound with mild soap and water o May Shower, gently pat wound dry prior to applying new dressing. Anesthetic Wound #1 Right,Anterior Lower Leg o Topical Lidocaine 4% cream applied to wound bed prior to debridement Wound #3 Left,Lateral Lower Leg o Topical Lidocaine 4% cream applied to wound bed prior to debridement Wound #8 Right,Posterior Lower Leg o Topical Lidocaine 4% cream applied to wound bed prior to debridement Skin Barriers/Peri-Wound Care Wound #1 Right,Anterior Lower Leg o Antifungal cream - please use on legs not on wounds, and TCA o Barrier cream - please use barrier cream around wounds Wound #3 Left,Lateral Lower Leg o Antifungal cream - please use on legs not on wounds, and TCA o Barrier cream Feasel, Tahra H. ([id number]) Wound #8 Right,Posterior Lower Leg o Antifungal cream - please use on legs not on wounds, and TCA o Barrier cream Primary Wound Dressing Wound #1 Right,Anterior Lower Leg o Iodoflex Wound #3 Left,Lateral Lower Leg o Iodoflex Wound #8 Right,Posterior Lower Leg o Iodoflex Secondary Dressing Wound #1 Right,Anterior Lower Leg o ABD pad - please add ABD pad to dressing o Dry Gauze Wound #3 Left,Lateral Lower Leg o ABD pad - please add ABD pad to dressing o Dry Gauze Wound #8 Right,Posterior Lower Leg o ABD pad - please add ABD pad to dressing o Dry Gauze Dressing Change  Frequency Wound #1 Right,Anterior Lower Leg o Change dressing every week Wound #3 Left,Lateral Lower Leg o Change dressing every week Wound #8 Right,Posterior Lower Leg o Change dressing every week Follow-up Appointments Wound #1 Right,Anterior Lower Leg o Return Appointment in 1 week. Wound #3 Left,Lateral Lower Leg o Return Appointment in 1 week. Wound #8 Right,Posterior Lower Leg o Return Appointment in 1 week. MALLARY, KREGER ([id number]) Edema Control Wound #1 Right,Anterior Lower Leg o Unna Boots Bilaterally o Elevate legs to the level of the heart and pump ankles as often as possible - Elevate legs at Dialysis Wound #3 Left,Lateral Lower Leg o Unna Boots Bilaterally o Elevate legs to the level of the heart and pump ankles as often as possible - Elevate legs at Dialysis Wound #8 Right,Posterior Lower Leg o Unna Boots Bilaterally o Elevate legs to the level of the heart and pump ankles as often as possible - Elevate legs at Dialysis Off-Loading Wound #1 Right,Anterior Lower Leg o Turn and reposition every 2 hours Wound #  3 Left,Lateral Lower Leg o Turn and reposition every 2 hours Wound #8 Right,Posterior Lower Leg o Turn and reposition every 2 hours Additional Orders / Instructions Wound #1 Right,Anterior Lower Leg o Activity as tolerated Wound #3 Left,Lateral Lower Leg o Activity as tolerated Wound #8 Right,Posterior Lower Leg o Activity as tolerated Medications-please add to medication list. Wound #1 Right,Anterior Lower Leg o Other: - Antifungal Cream to bilateral lower legs, NOT ON WOUNDS, and TCA. Electronic Signature(s) Signed: 09/12/2015 4:47:05 PM By: Alejandro Mulling Signed: 09/13/2015 8:09:37 AM By: Baltazar Najjar MD Entered By: Alejandro Mulling on 09/12/2015 08:52:59 Kalan, [name] ([id number]) -------------------------------------------------------------------------------- Problem List Details Patient  Name: Hodgens, Jahnia H. Date of Service: 09/12/2015 8:00 AM Medical Record Patient Account Number: [id number] Number: Treating RN: Phillis Haggis 11/23/1955 (59 y.o. 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 Date of Service: 09/12/2015 8:00 AM Medical Record Patient Account Number: [id number] Number: Treating RN: Phillis Haggis [birth year] (59 y.o. Other Clinician: Date of Birth/Sex: Female) Treating Shanetha Bradham Primary Care Physician/Extender: Comer Locket Physician: Referring Physician: Jones Broom in Treatment: 32 Subjective Chief Complaint Information obtained from Patient Chronic bilateral calf ulcers. History of Present Illness (HPI) Pleasant 60 year old with h/o DM (Hgb A1c 7.4 in Aug 2016), ESRD (on hemodialysis since July 2016). Presented to her PCP, Dr. Venora Maples, with BLE ulcers since June 2016. Arterial ultrasound in August 2016 showed no significant peripheral arterial disease on the  right and mild tibioperoneal atherosclerotic disease on the left. Left lower extremity ultrasound in May 2016 showed no evidence for DVT. No assessment for venous insufficiency. Culture 04/19/2015 grew methicillin sensitive staph aureus (sensitive to tetracycline), Stenotrophomonas maltophilia, and Enterococcus faecalis. Completed course of doxycycline. Hospitalized in Nov 2016 for worsening cellulitis. Treated with IV antibiotics. No operative debridement or biopsy. Punch biopsy of left calf ulcer 06/28/2015 showed no evidence for malignancy. Bilateral lower extremity venous ultrasound 07/20/2015 showed no evidence for DVT or chronic venous insufficiency. Performing dressing changes with silver alginate. Tolerating Profore light bilaterally 3x/week. She returns to clinic and is without complaints. No significant pain. No fever or chills. Less drainage. 08/09/2015 -- details over the last several months noted. Continues to stay hemodialysis 3 times a week. Says she is improving slowly and the pain is much better 08/16/15; this patient has wounds on her bilateral lower extremities which is been present since June 2016. The exact etiology of this is not really clear although she was hospitalized in October for cellulitis. I'm not sure if this was felt to be bilateral. In any case she had a debridement last week. She is using Aquacel Ag. Per the nursing staff the wounds continue to do nicely 08/22/15; patient's wounds were covered in a surface eschar with surrounding slough . All of this underwent a surgical  debridement including slough over the actual wound bed removing subcutaneous tissue. The wounds on the right lateral leg and right lateral leg all have improved quite nicely. She has been using Aquacel Ag 08/29/15 patient has bilateral leg wounds which are probably largely venous insufficiency. She does not have infection. All the wounds underwent surgical debridement with a curette. This was to  remove fibrinous surface slough and circumferential callus and nonviable subcutaneous tissue and skin. She tolerates this quite well. I changed her to Iodoflex last week. [facility] are [facility] Kari, Jaci H. ([id number]) 09/06/15; her wounds continued to improve both in appearance and in volume. We had reduced her from a Profore to a Profore light last week due to blistering on her skin. The blistering is better but her edema control is not so good. She still has itchy excoriated scan which makes me wonder whether she might be reacting to the surface layer of the Profore wraps. Her wounds required surgical debridement but again both of them look better. 09/12/15 continued improvement in the appearance of the wounds undergoing a surgical debridement of circumferential callus, fibrinous surface slough and nonviable subcutaneous tissue. Her skin ears a lot better leading me to believe she had some form of contact dermatitis with the skin contact layer of both the Profore in Profore light wraps. Objective Constitutional Supine Blood Pressure is within target range for patient.Marland Kitchen Respirations regular, non-labored and within target range.. Temperature is normal and within the target range for the patient.. Vitals Time Taken: 8:09 AM, Height: 68 in, Weight: 294 lbs, BMI: 44.7, Temperature: 98.0 F, Pulse: 86 bpm, Respiratory Rate: 20 breaths/min, Blood Pressure: 148/71 mmHg. Cardiovascular Pedal pulses palpable and strong bilaterally.. Extremities are free of varicosities, clubbing or edema. Peripheral pulses strong and equal. Capillary refill < 3 seconds.. General Notes: Wound exam; the area on the left lateral and right lateral lower leg appeared to be improving on both sides. Post-debridement healthy looking granulation with surrounding epithelialization. The skin on her legs looks a lot better. Edema control is adequate Integumentary (Hair, Skin) Wound #1 status is  Open. Original cause of wound was Gradually Appeared. The wound is located on the Right,Anterior Lower Leg. The wound measures 0.2cm length x 0.2cm width x 0.2cm depth; 0.031cm^2 area and 0.006cm^3 volume. The wound is limited to skin breakdown. There is no tunneling or undermining noted. There is a large amount of serous drainage noted. The wound margin is thickened. There is medium (34-66%) pink granulation within the wound bed. There is a medium (34-66%) amount of necrotic tissue within the wound bed including Eschar and Adherent Slough. The periwound skin appearance exhibited: Scarring, Moist. The periwound skin appearance did not exhibit: Callus, Crepitus, Excoriation, Fluctuance, Friable, Induration, Localized Edema, Rash, Dry/Scaly, Maceration, Atrophie Blanche, Cyanosis, Ecchymosis, Hemosiderin Staining, Mottled, Pallor, Rubor, Erythema. Periwound temperature was noted as No Abnormality. The periwound has tenderness on palpation. Wound #3 status is Open. Original cause of wound was Gradually Appeared. The wound is located on the Left,Lateral Lower Leg. The wound measures 8cm length x 2cm width x 0.2cm depth; 12.566cm^2 area and 2.513cm^3 volume. The wound is limited to skin breakdown. There is no tunneling or undermining noted. There is a large amount of serosanguineous drainage noted. The wound margin is flat and intact. There is Rallis, Michaella H. ([id number]) small (1-33%) red granulation within the wound bed. There is a large (67-100%) amount of necrotic tissue within the wound bed including Adherent Slough. The periwound skin appearance exhibited:  Scarring, Moist. The periwound skin appearance did not exhibit: Callus, Crepitus, Excoriation, Fluctuance, Friable, Induration, Localized Edema, Rash, Dry/Scaly, Maceration, Atrophie Blanche, Cyanosis, Ecchymosis, Hemosiderin Staining, Mottled, Pallor, Rubor, Erythema. Periwound temperature was noted as No Abnormality. The periwound has  tenderness on palpation. Wound #8 status is Open. Original cause of wound was Gradually Appeared. The wound is located on the Right,Posterior Lower Leg. The wound measures 2.9cm length x 1cm width x 0.1cm depth; 2.278cm^2 area and 0.228cm^3 volume. The wound is limited to skin breakdown. There is no tunneling or undermining noted. There is a large amount of serous drainage noted. The wound margin is flat and intact. There is medium (34-66%) red granulation within the wound bed. There is a medium (34-66%) amount of necrotic tissue within the wound bed including Eschar and Adherent Slough. The periwound skin appearance exhibited: Localized Edema, Moist. The periwound skin appearance did not exhibit: Callus, Crepitus, Excoriation, Fluctuance, Friable, Induration, Rash, Scarring, Dry/Scaly, Maceration, Atrophie Blanche, Cyanosis, Ecchymosis, Hemosiderin Staining, Mottled, Pallor, Rubor, Erythema. Periwound temperature was noted as No Abnormality. The periwound has tenderness on palpation. Assessment Active Problems ICD-10 E11.622 - Type 2 diabetes mellitus with other skin ulcer N18.6 - End stage renal disease L97.222 - Non-pressure chronic ulcer of left calf with fat layer exposed L97.212 - Non-pressure chronic ulcer of right calf with fat layer exposed R60.0 - Localized edema I89.0 - Lymphedema, not elsewhere classified Procedures Wound #3 Wound #3 is a Venous Leg Ulcer located on the Left,Lateral Lower Leg . There was a Skin/Subcutaneous Tissue Debridement ([id number]) debridement with total area of 16 sq cm performed by Maxwell Caul, MD. with the following instrument(s): Curette to remove Viable and Non-Viable tissue/material including Exudate, Fibrin/Slough, and Subcutaneous after achieving pain control using Other (lidocaine 4% cream). A time out was conducted prior to the start of the procedure. A Minimum amount of bleeding was controlled with Pressure. The procedure was tolerated  well with a pain level of 0 throughout and a pain level of 0 following the procedure. Post Debridement Measurements: 8cm length x 2cm width x 0.2cm depth; 2.513cm^3 volume. Post procedure Diagnosis Wound #3: Same as Pre-Procedure Coyle, Malone H. ([id number]) Wound #8 Wound #8 is a Venous Leg Ulcer located on the Right,Posterior Lower Leg . There was a Skin/Subcutaneous Tissue Debridement ([id number]) debridement with total area of 2.9 sq cm performed by Maxwell Caul, MD. with the following instrument(s): Curette to remove Viable and Non-Viable tissue/material including Exudate, Fibrin/Slough, and Subcutaneous after achieving pain control using Other (lidocaine 4% cream). A time out was conducted prior to the start of the procedure. A Minimum amount of bleeding was controlled with Pressure. The procedure was tolerated well with a pain level of 0 throughout and a pain level of 0 following the procedure. Post Debridement Measurements: 2.9cm length x 1cm width x 0.1cm depth; 0.228cm^3 volume. Post procedure Diagnosis Wound #8: Same as Pre-Procedure Plan Wound Cleansing: Wound #1 Right,Anterior Lower Leg: Clean wound with Normal Saline. Cleanse wound with mild soap and water May Shower, gently pat wound dry prior to applying new dressing. Wound #3 Left,Lateral Lower Leg: Clean wound with Normal Saline. Cleanse wound with mild soap and water May Shower, gently pat wound dry prior to applying new dressing. Wound #8 Right,Posterior Lower Leg: Clean wound with Normal Saline. Cleanse wound with mild soap and water May Shower, gently pat wound dry prior to applying new dressing. Anesthetic: Wound #1 Right,Anterior Lower Leg: Topical Lidocaine 4% cream applied to wound bed prior to  debridement Wound #3 Left,Lateral Lower Leg: Topical Lidocaine 4% cream applied to wound bed prior to debridement Wound #8 Right,Posterior Lower Leg: Topical Lidocaine 4% cream applied to wound bed prior to  debridement Skin Barriers/Peri-Wound Care: Wound #1 Right,Anterior Lower Leg: Antifungal cream - please use on legs not on wounds, and TCA Barrier cream - please use barrier cream around wounds Wound #3 Left,Lateral Lower Leg: Antifungal cream - please use on legs not on wounds, and TCA Barrier cream Wound #8 Right,Posterior Lower Leg: Antifungal cream - please use on legs not on wounds, and TCA Barrier cream Zinda, Madysyn H. ([id number]) Primary Wound Dressing: Wound #1 Right,Anterior Lower Leg: Iodoflex Wound #3 Left,Lateral Lower Leg: Iodoflex Wound #8 Right,Posterior Lower Leg: Iodoflex Secondary Dressing: Wound #1 Right,Anterior Lower Leg: ABD pad - please add ABD pad to dressing Dry Gauze Wound #3 Left,Lateral Lower Leg: ABD pad - please add ABD pad to dressing Dry Gauze Wound #8 Right,Posterior Lower Leg: ABD pad - please add ABD pad to dressing Dry Gauze Dressing Change Frequency: Wound #1 Right,Anterior Lower Leg: Change dressing every week Wound #3 Left,Lateral Lower Leg: Change dressing every week Wound #8 Right,Posterior Lower Leg: Change dressing every week Follow-up Appointments: Wound #1 Right,Anterior Lower Leg: Return Appointment in 1 week. Wound #3 Left,Lateral Lower Leg: Return Appointment in 1 week. Wound #8 Right,Posterior Lower Leg: Return Appointment in 1 week. Edema Control: Wound #1 Right,Anterior Lower Leg: Unna Boots Bilaterally Elevate legs to the level of the heart and pump ankles as often as possible - Elevate legs at Dialysis Wound #3 Left,Lateral Lower Leg: Unna Boots Bilaterally Elevate legs to the level of the heart and pump ankles as often as possible - Elevate legs at Dialysis Wound #8 Right,Posterior Lower Leg: Unna Boots Bilaterally Elevate legs to the level of the heart and pump ankles as often as possible - Elevate legs at Dialysis Off-Loading: Wound #1 Right,Anterior Lower Leg: Turn and reposition every 2 hours Wound  #3 Left,Lateral Lower Leg: Turn and reposition every 2 hours Wound #8 Right,Posterior Lower Leg: Turn and reposition every 2 hours Additional Orders / Instructions: Wound #1 Right,Anterior Lower Leg: Activity as tolerated Levi, Sammantha H. ([id number]) Wound #3 Left,Lateral Lower Leg: Activity as tolerated Wound #8 Right,Posterior Lower Leg: Activity as tolerated Medications-please add to medication list.: Wound #1 Right,Anterior Lower Leg: Other: - Antifungal Cream to bilateral lower legs, NOT ON WOUNDS, and TCA. I am happy with the conditon of her wounds bilaterally. Continue iodoflex under Unna wrap which seems to have had a positive effect on her skin (contact dermatitiis from contact layer of profore) I have asked her to bring her juxta lites next week Electronic Signature(s) Signed: 09/13/2015 8:09:37 AM By: Baltazar Najjar MD Entered By: Baltazar Najjar on 09/12/2015 09:03:05 Wicklund, [name] ([id number]) -------------------------------------------------------------------------------- SuperBill Details Patient Name: Barretta, Alleyah H. Date of Service: 09/12/2015 Medical Record Patient Account Number: [id number] Number: Treating RN: Phillis Haggis 1955/09/06 (59 y.o. Other Clinician: Date of Birth/Sex: Female) Treating Markevious Ehmke Primary Care Physician/Extender: [place]er [place] Physician: Weeks in Treatment: 32 Referring Physician: Fidel Levy Diagnosis Coding ICD-10 Codes Code Description [phone] Type 2 diabetes mellitus with other skin ulcer N18.6 End stage renal disease L97.222 Non-pressure chronic ulcer of left calf with fat layer exposed L97.212 Non-pressure chronic ulcer of right calf with fat layer exposed R60.0 Localized edema I89.0 Lymphedema, not elsewhere classified Facility Procedures CPT4 Code: [id number] Description: 11042 - DEB SUBQ TISSUE 20 SQ CM/< ICD-10 Description Diagnosis L97.222  Non-pressure chronic ulcer of left  calf with fat l Modifier: ayer exposed Quantity: 1 Physician Procedures CPT4 Code: 1610960 Description: 11042 - WC PHYS SUBQ TISS 20 SQ CM ICD-10 Description Diagnosis L97.222 Non-pressure chronic ulcer of left calf with fat l Modifier: ayer exposed Quantity: 1 Electronic Signature(s) Signed: 09/13/2015 8:09:37 AM By: Baltazar Najjar MD Entered By: Baltazar Najjar on 09/12/2015 09:03:41

## 2015-09-14 ENCOUNTER — Ambulatory Visit (INDEPENDENT_AMBULATORY_CARE_PROVIDER_SITE_OTHER): Payer: BC Managed Care – PPO | Admitting: Family Medicine

## 2015-09-14 ENCOUNTER — Encounter: Payer: Self-pay | Admitting: Family Medicine

## 2015-09-14 VITALS — BP 140/80 | HR 78 | Temp 98.3°F | Resp 16 | Ht 66.0 in | Wt 253.6 lb

## 2015-09-14 DIAGNOSIS — I10 Essential (primary) hypertension: Secondary | ICD-10-CM

## 2015-09-14 DIAGNOSIS — N184 Chronic kidney disease, stage 4 (severe): Secondary | ICD-10-CM

## 2015-09-14 DIAGNOSIS — E119 Type 2 diabetes mellitus without complications: Secondary | ICD-10-CM

## 2015-09-14 LAB — POCT GLYCOSYLATED HEMOGLOBIN (HGB A1C): Hemoglobin A1C: 8.9

## 2015-09-14 NOTE — Progress Notes (Signed)
Name: [name]   MRN: [id number]    DOB: 04-10-1956   Date:09/14/2015       Progress Note  Subjective  Chief Complaint  Chief Complaint  Patient presents with  . Diabetes    a1c 8.4 05/30/2015  Average BS reading:     Diabetes Pertinent negatives for hypoglycemia include no dizziness, headaches or tremors. Pertinent negatives for diabetes include no blurred vision, no chest pain, no weakness and no weight loss.   Here for f/u of HBP and DM and chronic renal failure (on dialysis).   Her leg ulcers are almost totally healed.  She has seen Dr. Mervyn Skeeters.....    , Endocrine at [facility] for her DM.  Seen last week and meds not changed.   She may move to [place] in the near future.   No problem-specific assessment & plan notes found for this encounter.   Past Medical History  Diagnosis Date  . Chronic kidney disease   . Hypertension   . Diabetes mellitus without complication [facility])     Past Surgical History  Procedure Laterality Date  . Eye surgery  bil cataracts  . Av fistula placement Left 11/23/2014    Procedure: ARTERIOVENOUS (AV) FISTULA CREATION;  Surgeon: Annice Needy, MD;  Location: [facility] ORS;  Service: Vascular;  Laterality: Left;  . Peripheral vascular catheterization N/A 02/10/2015    Procedure: Dialysis/Perma Catheter Insertion;  Surgeon: Annice Needy, MD;  Location: [facility] INVASIVE CV LAB;  Service: Cardiovascular;  Laterality: N/A;  . Peripheral vascular catheterization N/A 03/13/2015    Procedure: A/V Shuntogram/Fistulagram;  Surgeon: Annice Needy, MD;  Location: [facility] INVASIVE CV LAB;  Service: Cardiovascular;  Laterality: N/A;  . Peripheral vascular catheterization N/A 03/13/2015    Procedure: A/V Shunt Intervention;  Surgeon: Annice Needy, MD;  Location: [facility] INVASIVE CV LAB;  Service: Cardiovascular;  Laterality: N/A;  . Peripheral vascular catheterization Left 04/27/2015    Procedure: A/V Shuntogram/Fistulagram;  Surgeon: Annice Needy, MD;  Location: [facility] INVASIVE CV LAB;   Service: Cardiovascular;  Laterality: Left;  . Peripheral vascular catheterization N/A 04/27/2015    Procedure: A/V Shunt Intervention;  Surgeon: Annice Needy, MD;  Location: [facility] INVASIVE CV LAB;  Service: Cardiovascular;  Laterality: N/A;  . Peripheral vascular catheterization N/A 05/11/2015    Procedure: Dialysis/Perma Catheter Removal;  Surgeon: Annice Needy, MD;  Location: [facility] INVASIVE CV LAB;  Service: Cardiovascular;  Laterality: N/A;    Family History  Problem Relation Age of Onset  . COPD Mother   . Cancer Father     Social History   Social History  . Marital Status: Widowed    Spouse Name: N/A  . Number of Children: N/A  . Years of Education: N/A   Occupational History  . Not on file.   Social History Main Topics  . Smoking status: Never Smoker   . Smokeless tobacco: Never Used  . Alcohol Use: No  . Drug Use: No  . Sexual Activity: No   Other Topics Concern  . Not on file   Social History Narrative     Current outpatient prescriptions:  .  carvedilol (COREG) 3.125 MG tablet, TAKE ONE TABLET BY MOUTH TWICE DAILY (Patient taking differently: TAKE ONE TABLET BY MOUTH TWICE DAILY on Tuesday, Thursday, Saturday, and Sunday), Disp: 60 tablet, Rfl: 6 .  carvedilol (COREG) 3.125 MG tablet, Take 3.125 mg by mouth every evening. On Monday, Wednesday, and Friday, Disp: , Rfl:  .  furosemide (LASIX) 80 MG  tablet, Take 80 mg by mouth daily. On Tuesday, Thursday, Saturday, and Sunday, Disp: , Rfl:  .  insulin aspart (NOVOLOG) 100 UNIT/ML FlexPen, Inject 6-8 Units into the skin 3 (three) times daily with meals. , Disp: , Rfl:  .  Insulin Detemir (LEVEMIR FLEXPEN) 100 UNIT/ML Pen, Inject 36 Units into the skin daily at 10 pm. , Disp: , Rfl:  .  Lancets (FREESTYLE) lancets, USE TO CHECK BLOOD SUGAR 4 TIMES DAILY, Disp: 200 each, Rfl: 12 .  lidocaine-prilocaine (EMLA) cream, Apply 1 application topically 3 (three) times a week. On Monday, Wednesday, and Friday, before dialysis,  Disp: , Rfl:  .  losartan (COZAAR) 50 MG tablet, TAKE ONE TABLET BY MOUTH ONCE DAILY, Disp: 30 tablet, Rfl: 12 .  collagenase (SANTYL) ointment, Apply topically. Reported on 09/14/2015, Disp: , Rfl:  .  loratadine (CLARITIN) 10 MG tablet, Take 10 mg by mouth daily. Reported on 09/14/2015, Disp: , Rfl:   Allergies  Allergen Reactions  . Codeine Nausea Only    hydrocodone  . Vicodin [Hydrocodone-Acetaminophen] Nausea And Vomiting     Review of Systems  Constitutional: Negative for fever, chills, weight loss and malaise/fatigue.  HENT: Negative for hearing loss.   Eyes: Negative for blurred vision and double vision.  Respiratory: Negative for cough, shortness of breath and wheezing.   Cardiovascular: Negative for chest pain, palpitations and leg swelling.  Gastrointestinal: Negative for heartburn, abdominal pain and blood in stool.  Genitourinary: Negative for dysuria, urgency and frequency.  Musculoskeletal: Negative for myalgias and joint pain.  Skin: Negative for itching and rash.       Bilateral leg ulcers slowly resolving.  Neurological: Negative for dizziness, tremors, weakness and headaches.  Psychiatric/Behavioral: Negative for depression and substance abuse. The patient does not have insomnia.       Objective  Filed Vitals:   09/14/15 0954 09/14/15 1103  BP: 159/78 140/80  Pulse: 78   Temp: 98.3 F (36.8 C)   TempSrc: Oral   Resp: 16   Height:  (1.676 m)   Weight: 253 lb 9.6 oz (115.032 kg)     Physical Exam  Constitutional: She is oriented to person, place, and time and well-developed, well-nourished, and in no distress. No distress.  HENT:  Head: Normocephalic and atraumatic.  Eyes: Conjunctivae and EOM are normal. Pupils are equal, round, and reactive to light. No scleral icterus.  Neck: Normal range of motion. Neck supple. Carotid bruit is not present. No thyromegaly present.  Cardiovascular: Normal rate and regular rhythm.  Exam reveals no gallop and no  friction rub.   Murmur heard.  Systolic murmur is present with a grade of 3/6  Throughout.  Pulmonary/Chest: Effort normal and breath sounds normal. No respiratory distress. She has no wheezes. She has no rales.  Musculoskeletal:  Has UNNA boots on bilaterally for leg ulcers and swelling.  Lymphadenopathy:    She has no cervical adenopathy.  Neurological: She is alert and oriented to person, place, and time.  Vitals reviewed.      Recent Results (from the past 2160 hour(s))  Surgical pathology     Status: None   Collection Time: 06/28/15 12:00 AM  Result Value Ref Range   SURGICAL PATHOLOGY      Surgical Pathology CASE: [id number] PATIENT: Baylynn Hooton Surgical Pathology Report     SPECIMEN SUBMITTED: A. Skin, left lateral lower leg, biopsy  CLINICAL HISTORY: None provided  PRE-OPERATIVE DIAGNOSIS: Chronic leg ulcers  POST-OPERATIVE DIAGNOSIS: None provided  DIAGNOSIS: A. SKIN, LEFT LATERAL LOWER LEG; BIOPSY: - CHRONIC ULCER WITH ADJACENT GRANULATION TISSUE, AND THICK FIBRINOUS EXUDATE WITH FOCAL CALCIFICATION. - NEGATIVE FOR MALIGNANCY IN THIS SPECIMEN.  Comment: The ulcer and exudate are present at the deep edge of the sample, so it is not possible to evaluate for underlying vasculitis or panniculitis. No granulomas are seen.   GROSS DESCRIPTION: A. Labeled: left lateral lower leg Tissue fragment(s): 1 Size: 0.2 cm punch of tissue excised up to 0.4 cm Description: punch, slightly distorted, inked blue  Entirely submitted in one cassette(s).  Final Diagnosis performed by Ronald Lobo, MD.  Electronically signed 06/29/2015 11: 00:25AM    The electronic signature indicates that the named Attending Pathologist has evaluated the specimen  Technical component performed at [facility], [address], [place], [place] [address] Lab: [phone] Dir: Titus [name]. Cato Mulligan, MD  Professional component performed at [facility], [address] [place], [place], [place] [address] Lab: [phone] Dir: Georgiann Cocker. Rubinas, MD    POCT HgB A1C     Status: Abnormal   Collection Time: 09/14/15 10:21 AM  Result Value Ref Range   Hemoglobin A1C 8.9      Assessment & Plan  Problem List Items Addressed This Visit      Cardiovascular and Mediastinum   Essential (primary) hypertension     Endocrine   Diabetes mellitus, type 2 (HCC) - Primary   Relevant Medications   insulin aspart (NOVOLOG) 100 UNIT/ML FlexPen   Insulin Detemir (LEVEMIR FLEXPEN) 100 UNIT/ML Pen   Other Relevant Orders   POCT HgB A1C (Completed)     Genitourinary   Chronic kidney disease (CKD), stage IV (severe) (HCC)      Meds ordered this encounter  Medications  . insulin aspart (NOVOLOG) 100 UNIT/ML FlexPen    Sig: Inject 6-8 Units into the skin 3 (three) times daily with meals.   . Insulin Detemir (LEVEMIR FLEXPEN) 100 UNIT/ML Pen    Sig: Inject 36 Units into the skin daily at 10 pm.   . collagenase (SANTYL) ointment    Sig: Apply topically. Reported on 09/14/2015  . DISCONTD: doxycycline (VIBRA-TABS) 100 MG tablet    Sig: Reported on 09/14/2015   1. Type 2 diabetes mellitus without complication, without long-term current use of insulin (HCC) Discuss with her Endocrinologist re: sugar control. - POCT HgB A1C-8.9  2. Essential (primary) hypertension Cont. meds  3. Chronic kidney disease (CKD), stage IV (severe) (HCC) Cont. Dialysis

## 2015-09-18 DIAGNOSIS — D631 Anemia in chronic kidney disease: Secondary | ICD-10-CM | POA: Diagnosis not present

## 2015-09-19 ENCOUNTER — Encounter: Payer: BC Managed Care – PPO | Attending: Internal Medicine | Admitting: Internal Medicine

## 2015-09-19 DIAGNOSIS — L97222 Non-pressure chronic ulcer of left calf with fat layer exposed: Secondary | ICD-10-CM | POA: Insufficient documentation

## 2015-09-19 DIAGNOSIS — E1122 Type 2 diabetes mellitus with diabetic chronic kidney disease: Secondary | ICD-10-CM | POA: Insufficient documentation

## 2015-09-19 DIAGNOSIS — L97811 Non-pressure chronic ulcer of other part of right lower leg limited to breakdown of skin: Secondary | ICD-10-CM | POA: Diagnosis not present

## 2015-09-19 DIAGNOSIS — I890 Lymphedema, not elsewhere classified: Secondary | ICD-10-CM | POA: Insufficient documentation

## 2015-09-19 DIAGNOSIS — L97212 Non-pressure chronic ulcer of right calf with fat layer exposed: Secondary | ICD-10-CM | POA: Insufficient documentation

## 2015-09-19 DIAGNOSIS — I87313 Chronic venous hypertension (idiopathic) with ulcer of bilateral lower extremity: Secondary | ICD-10-CM | POA: Diagnosis not present

## 2015-09-19 DIAGNOSIS — N186 End stage renal disease: Secondary | ICD-10-CM | POA: Insufficient documentation

## 2015-09-19 DIAGNOSIS — E11622 Type 2 diabetes mellitus with other skin ulcer: Secondary | ICD-10-CM | POA: Diagnosis present

## 2015-09-19 DIAGNOSIS — I120 Hypertensive chronic kidney disease with stage 5 chronic kidney disease or end stage renal disease: Secondary | ICD-10-CM | POA: Diagnosis not present

## 2015-09-19 DIAGNOSIS — L97821 Non-pressure chronic ulcer of other part of left lower leg limited to breakdown of skin: Secondary | ICD-10-CM | POA: Diagnosis not present

## 2015-09-19 DIAGNOSIS — Z992 Dependence on renal dialysis: Secondary | ICD-10-CM | POA: Diagnosis not present

## 2015-09-21 NOTE — Progress Notes (Signed)
[name], [name] ([id number]) Visit Report for 09/19/2015 Arrival Information Details Patient Name: [name], [name]. Date of Service: 09/19/2015 8:00 AM Medical Record Patient Account Number: [id number] Number: Treating RN: Phillis Haggis 1956/01/11 (60 y.o. Other Clinician: Date of Birth/Sex: Female) Treating ROBSON, MICHAEL Primary Care Physician/Extender: Comer Locket Physician: Referring Physician: Jones Broom in Treatment: 58 Visit Information History Since Last Visit All ordered tests and consults were completed: No Patient Arrived: Ambulatory Added or deleted any medications: No Arrival Time: 08:15 Any new allergies or adverse reactions: No Accompanied By: self Had a fall or experienced change in No Transfer Assistance: None activities of daily living that may affect Patient Identification Verified: Yes risk of falls: Secondary Verification Process Yes Signs or symptoms of abuse/neglect since last No Completed: visito Patient Requires Transmission- No Hospitalized since last visit: No Based Precautions: Pain Present Now: No Patient Has Alerts: Yes Patient Alerts: DMII ABI  Bilateral Electronic Signature(s) Signed: 09/19/2015 5:37:49 PM By: Alejandro Mulling Entered By: Alejandro Mulling on 09/19/2015 08:15:52 Hunnicutt, Bethzy Rexene Edison ([id number]) -------------------------------------------------------------------------------- Encounter Discharge Information Details Patient Name: Belknap, Aishani H. Date of Service: 09/19/2015 8:00 AM Medical Record Patient Account Number: [id number] Number: Treating RN: Phillis Haggis 03-14-1956 (60 y.o. Other Clinician: Date of Birth/Sex: Female) Treating ROBSON, MICHAEL Primary Care Physician/Extender: Comer Locket Physician: Referring Physician: Jones Broom in Treatment: 46 Encounter Discharge Information Items Discharge Pain Level: 0 Discharge Condition:  Stable Ambulatory Status: Ambulatory Discharge Destination: Home Transportation: Private Auto Accompanied By: self Schedule Follow-up Appointment: Yes Medication Reconciliation completed and provided to Patient/Care Yes Nakoa Ganus: Provided on Clinical Summary of Care: 09/19/2015 Form Type Recipient Paper Patient PM Electronic Signature(s) Signed: 09/19/2015 5:37:49 PM By: Alejandro Mulling Previous Signature: 09/19/2015 9:45:38 AM Version By: Gwenlyn Perking Entered By: Alejandro Mulling on 09/19/2015 09:54:59 Mohler, Evelean H. ([id number]) -------------------------------------------------------------------------------- Lower Extremity Assessment Details Patient Name: Minteer, Crystallee H. Date of Service: 09/19/2015 8:00 AM Medical Record Patient Account Number: [id number] Number: Treating RN: Phillis Haggis 18-Aug-1955 (60 y.o. Other Clinician: Date of Birth/Sex: Female) Treating ROBSON, MICHAEL Primary Care Physician/Extender: Comer Locket Physician: Referring Physician: Jones Broom in Treatment: 33 Edema Assessment Assessed: [Left: No] [Right: No] Edema: [Left: Yes] [Right: Yes] Calf Left: Right: Point of Measurement: 34 cm From Medial Instep 41.6 cm 40.5 cm Ankle Left: Right: Point of Measurement: 10 cm From Medial Instep 27 cm 25 cm Vascular Assessment Pulses: Posterior Tibial Dorsalis Pedis Palpable: [Left:Yes] [Right:Yes] Extremity colors, hair growth, and conditions: Extremity Color: [Left:Red] [Right:Red] Temperature of Extremity: [Left:Warm] [Right:Warm] Capillary Refill: [Left:< 3 seconds] [Right:< 3 seconds] Toe Nail Assessment Left: Right: Thick: No No Discolored: No No Deformed: No No Improper Length and Hygiene: No No Electronic Signature(s) Signed: 09/19/2015 5:37:49 PM By: Alejandro Mulling Entered By: Alejandro Mulling on 09/19/2015 08:38:01 Bunney, Shantasia H. ([id number]) Dolores, Lanay H.  ([id number]) -------------------------------------------------------------------------------- Multi Wound Chart Details Patient Name: Lufkin, Giannamarie H. Date of Service: 09/19/2015 8:00 AM Medical Record Patient Account Number: [id number] Number: Treating RN: Phillis Haggis 12-Oct-1955 (60 y.o. Other Clinician: Date of Birth/Sex: Female) Treating ROBSON, MICHAEL Primary Care Physician/Extender: Comer Locket Physician: Referring Physician: Jones Broom in Treatment: 33 Vital Signs Height(in): 68 Pulse(bpm): 73 Weight(lbs): 294 Blood Pressure 161/92 (mmHg): Body Mass Index(BMI): 45 Temperature(F): 98.1 Respiratory Rate 20 (breaths/min): Photos: [1:No Photos] [3:No Photos] [8:No Photos] Wound Location: [1:Right Lower Leg - Anterior Left Lower Leg - Lateral] [8:Right Lower Leg - Posterior] Wounding  Event: [1:Gradually Appeared] [3:Gradually Appeared] [8:Gradually Appeared] Primary Etiology: [1:Venous Leg Ulcer] [3:Venous Leg Ulcer] [8:Venous Leg Ulcer] Comorbid History: [1:Hypertension, Type II Diabetes] [3:Hypertension, Type II Diabetes] [8:Hypertension, Type II Diabetes] Date Acquired: [1:01/09/2015] [3:01/09/2015] [8:03/27/2015] Weeks of Treatment: [1:33] [3:33] [8:24] Wound Status: [1:Open] [3:Open] [8:Open] Measurements L x W x D 0.1x0.1x0.2 [3:7x2x0.2] [8:0.2x0.2x0.1] (cm) Area (cm) : [1:0.008] [3:10.996] [8:0.031] Volume (cm) : [1:0.002] [3:2.199] [8:0.003] % Reduction in Area: [1:83.00%] [3:0.00%] [8:98.20%] % Reduction in Volume: 60.00% [3:-99.90%] [8:98.30%] Classification: [1:Full Thickness Without Exposed Support Structures] [3:Full Thickness Without Exposed Support Structures] [8:Full Thickness Without Exposed Support Structures] HBO Classification: [1:Grade 2] [3:Grade 1] [8:Grade 1] Exudate Amount: [1:Large] [3:Large] [8:Large] Exudate Type: [1:Serous] [3:Serosanguineous] [8:Serous] Exudate Color: [1:amber] [3:red, brown]  [8:amber] Wound Margin: [1:Thickened] [3:Flat and Intact] [8:Flat and Intact] Granulation Amount: [1:Large (67-100%)] [3:Large (67-100%)] [8:Small (1-33%)] Granulation Quality: [1:Pink] [3:Red] [8:Red, Hyper-granulation] Necrotic Amount: [1:Small (1-33%)] [3:Small (1-33%)] [8:Small (1-33%)] Necrotic Tissue: Eschar, Adherent Slough Adherent [name], Adherent Slough Exposed Structures: Fascia: No Fascia: No Fascia: No Fat: No Fat: No Fat: No Tendon: No Tendon: No Tendon: No Muscle: No Muscle: No Muscle: No Joint: No Joint: No Joint: No Bone: No Bone: No Bone: No Limited to Skin Limited to Skin Limited to Skin Breakdown Breakdown Breakdown Epithelialization: Small (1-33%) Small (1-33%) Small (1-33%) Periwound Skin Texture: Scarring: Yes Scarring: Yes Edema: Yes Edema: No Edema: No Excoriation: No Excoriation: No Excoriation: No Induration: No Induration: No Induration: No Callus: No Callus: No Callus: No Crepitus: No Crepitus: No Crepitus: No Fluctuance: No Fluctuance: No Fluctuance: No Friable: No Friable: No Friable: No Rash: No Rash: No Rash: No Scarring: No Periwound Skin Moist: Yes Moist: Yes Moist: Yes Moisture: Maceration: No Maceration: No Maceration: No Dry/Scaly: No Dry/Scaly: No Dry/Scaly: No Periwound Skin Color: Atrophie Blanche: No Atrophie Blanche: No Atrophie Blanche: No Cyanosis: No Cyanosis: No Cyanosis: No Ecchymosis: No Ecchymosis: No Ecchymosis: No Erythema: No Erythema: No Erythema: No Hemosiderin Staining: No Hemosiderin Staining: No Hemosiderin Staining: No Mottled: No Mottled: No Mottled: No Pallor: No Pallor: No Pallor: No Rubor: No Rubor: No Rubor: No Temperature: No Abnormality No Abnormality No Abnormality Tenderness on Yes Yes Yes Palpation: Wound Preparation: Ulcer Cleansing: Other: Ulcer Cleansing: Other: Ulcer Cleansing: Other: soap and water soap and water soap and water Topical Anesthetic  Topical Anesthetic Topical Anesthetic Applied: Other: lidocaine Applied: Other: lidocaine Applied: Other: lidocaine 4% 4% 4% Treatment Notes Electronic Signature(s) Signed: 09/19/2015 5:37:49 PM By: Alejandro Mulling Entered By: Alejandro Mulling on 09/19/2015 08:45:35 Mcfetridge, Walker Rexene Edison ([id number]) -------------------------------------------------------------------------------- Multi-Disciplinary Care Plan Details Patient Name: Ledo, Zykia H. Date of Service: 09/19/2015 8:00 AM Medical Record Patient Account Number: [id number] Number: Treating RN: Phillis Haggis 14-Jun-1956 (59 y.o. Other Clinician: Date of Birth/Sex: Female) Treating ROBSON, MICHAEL Primary Care Physician/Extender: Comer Locket Physician: Referring Physician: Jones Broom in Treatment: 29 Active Inactive Orientation to the Wound Care Program Nursing Diagnoses: Knowledge deficit related to the wound healing center program Goals: Patient/caregiver will verbalize understanding of the Wound Healing Center Program Date Initiated: 01/30/2015 Goal Status: Active Interventions: Provide education on orientation to the wound center Notes: Pain, Acute or Chronic Nursing Diagnoses: Pain, acute or chronic: actual or potential Goals: Patient will verbalize adequate pain control and receive pain control interventions during procedures as needed Date Initiated: 01/30/2015 Goal Status: Active Patient/caregiver will verbalize adequate pain control between visits Date Initiated: 01/30/2015 Goal Status: Active Interventions: Assess comfort goal upon admission Encourage patient to take pain medications as  prescribed Notes: HANIA, CERONE ([id number]) Venous Leg Ulcer Nursing Diagnoses: Potential for venous Insuffiency (use before diagnosis confirmed) Goals: Non-invasive venous studies are completed as ordered Date Initiated: 01/30/2015 Goal Status: Active Interventions: Assess  peripheral edema status every visit. Notes: Wound/Skin Impairment Nursing Diagnoses: Impaired tissue integrity Goals: Ulcer/skin breakdown will have a volume reduction of 30% by week 4 Date Initiated: 01/30/2015 Goal Status: Active Ulcer/skin breakdown will have a volume reduction of 50% by week 8 Date Initiated: 01/30/2015 Goal Status: Active Ulcer/skin breakdown will have a volume reduction of 80% by week 12 Date Initiated: 01/30/2015 Goal Status: Active Ulcer/skin breakdown will heal within 14 weeks Date Initiated: 01/30/2015 Goal Status: Active Interventions: Assess ulceration(s) every visit Notes: Electronic Signature(s) Signed: 09/19/2015 5:37:49 PM By: Alejandro Mulling Entered By: Alejandro Mulling on 09/19/2015 08:45:29 Frater, Marisabel H. ([id number]) -------------------------------------------------------------------------------- Pain Assessment Details Patient Name: Barbuto, Ryen H. Date of Service: 09/19/2015 8:00 AM Medical Record Patient Account Number: [id number] Number: Treating RN: Phillis Haggis [birth year] (59 y.o. Other Clinician: Date of Birth/Sex: Female) Treating ROBSON, MICHAEL Primary Care Physician/Extender: Comer Locket Physician: Referring Physician: Jones Broom in Treatment: 31 Active Problems Location of Pain Severity and Description of Pain Patient Has Paino No Site Locations Pain Management and Medication Current Pain Management: Electronic Signature(s) Signed: 09/19/2015 5:37:49 PM By: Alejandro Mulling Entered By: Alejandro Mulling on 09/19/2015 08:16:00 Dizon, Breeze Rexene Edison ([id number]) -------------------------------------------------------------------------------- Patient/Caregiver Education Details Patient Name: Matheny, Naquisha H. Date of Service: 09/19/2015 8:00 AM Medical Record Patient Account Number: [id number] Number: Treating RN: Phillis Haggis [birth year] (59 y.o. Other Clinician: Date of  Birth/Gender: Female) Treating ROBSON, MICHAEL Primary Care Physician/Extender: Comer Locket Physician: Weeks in Treatment: 33 Referring Physician: Fidel Levy Education Assessment Education Provided To: Patient Education Topics Provided Wound/Skin Impairment: Handouts: Other: do not get wraps wet Methods: Demonstration, Explain/Verbal Responses: State content correctly Electronic Signature(s) Signed: 09/19/2015 5:37:49 PM By: Alejandro Mulling Entered By: Alejandro Mulling on 09/19/2015 09:55:32 Hand, Rhiannan H. ([id number]) -------------------------------------------------------------------------------- Wound Assessment Details Patient Name: Younts, Malaka H. Date of Service: 09/19/2015 8:00 AM Medical Record Patient Account Number: [id number] Number: Treating RN: Phillis Haggis 1955/11/22 (59 y.o. Other Clinician: Date of Birth/Sex: Female) Treating ROBSON, MICHAEL Primary Care Physician/Extender: Comer Locket Physician: Referring Physician: Fidel Levy Weeks in Treatment: 33 Wound Status Wound Number: 1 Primary Etiology: Venous Leg Ulcer Wound Location: Right Lower Leg - Anterior Wound Status: Open Wounding Event: Gradually Appeared Comorbid History: Hypertension, Type II Diabetes Date Acquired: 01/09/2015 Weeks Of Treatment: 33 Clustered Wound: No Photos Photo Uploaded By: Alejandro Mulling on 09/19/2015 17:29:38 Wound Measurements Length: (cm) 0.1 Width: (cm) 0.1 Depth: (cm) 0.2 Area: (cm) 0.008 Volume: (cm) 0.002 % Reduction in Area: 83% % Reduction in Volume: 60% Epithelialization: Small (1-33%) Tunneling: No Undermining: No Wound Description Full Thickness Without Foul Odor Afte Classification: Exposed Support Structures Diabetic Severity Grade 2 (Wagner): Wound Margin: Thickened Exudate Amount: Large Exudate Type: Serous Exudate Color: amber Liebert, Melisia H. ([id number]) r Cleansing: No Wound  Bed Granulation Amount: Large (67-100%) Exposed Structure Granulation Quality: Pink Fascia Exposed: No Necrotic Amount: Small (1-33%) Fat Layer Exposed: No Necrotic Quality: Eschar, Adherent Slough Tendon Exposed: No Muscle Exposed: No Joint Exposed: No Bone Exposed: No Limited to Skin Breakdown Periwound Skin Texture Texture Color No Abnormalities Noted: No No Abnormalities Noted: No Callus: No Atrophie Blanche: No Crepitus: No Cyanosis: No Excoriation: No Ecchymosis: No Fluctuance: No Erythema: No Friable: No Hemosiderin Staining: No Induration: No  Mottled: No Localized Edema: No Pallor: No Rash: No Rubor: No Scarring: Yes Temperature / Pain Moisture Temperature: No Abnormality No Abnormalities Noted: No Tenderness on Palpation: Yes Dry / Scaly: No Maceration: No Moist: Yes Wound Preparation Ulcer Cleansing: Other: soap and water, Topical Anesthetic Applied: Other: lidocaine 4%, Treatment Notes Wound #1 (Right, Anterior Lower Leg) 1. Cleansed with: Cleanse wound with antibacterial soap and water 2. Anesthetic Topical Lidocaine 4% cream to wound bed prior to debridement 3. Peri-wound Care: Barrier cream 4. Dressing Applied: Other dressing (specify in notes) 7. Secured with [employer] Bilaterally Notes [name], Diona H. ([id number]) TCA ointment, Nystatin cream, Iodosorb ointment Electronic Signature(s) Signed: 09/19/2015 5:37:49 PM By: Alejandro MullingPinkerton, Debra Entered By: Alejandro MullingPinkerton, Debra on 09/19/2015 08:41:57 Forse, Aldea H. ([id number]) -------------------------------------------------------------------------------- Wound Assessment Details Patient Name: Burgher, Celsa H. Date of Service: 09/19/2015 8:00 AM Medical Record Patient Account Number: [id number] Number: Treating RN: Phillis Haggisinkerton, Debi [birth year] (59 y.o. Other Clinician: Date of Birth/Sex: Female) Treating ROBSON, MICHAEL Primary Care Physician/Extender: Comer LocketG HAWKINS JR,  JAMES Physician: Referring Physician: Fidel LevyHAWKINS JR, JAMES Weeks in Treatment: 33 Wound Status Wound Number: 3 Primary Etiology: Venous Leg Ulcer Wound Location: Left Lower Leg - Lateral Wound Status: Open Wounding Event: Gradually Appeared Comorbid History: Hypertension, Type II Diabetes Date Acquired: 01/09/2015 Weeks Of Treatment: 33 Clustered Wound: No Photos Photo Uploaded By: Alejandro MullingPinkerton, Debra on 09/19/2015 17:29:38 Wound Measurements Length: (cm) 7 Width: (cm) 2 Depth: (cm) 0.2 Area: (cm) 10.996 Volume: (cm) 2.199 % Reduction in Area: 0% % Reduction in Volume: -99.9% Epithelialization: Small (1-33%) Tunneling: No Undermining: No Wound Description Full Thickness Without Exposed Foul Odor Classification: Support Structures Diabetic Severity Grade 1 (Wagner): Wound Margin: Flat and Intact Exudate Amount: Large Exudate Type: Serosanguineous Exudate Color: red, brown Mallis, Madeleine H. ([id number]) After Cleansing: No Wound Bed Granulation Amount: Large (67-100%) Exposed Structure Granulation Quality: Red Fascia Exposed: No Necrotic Amount: Small (1-33%) Fat Layer Exposed: No Necrotic Quality: Adherent Slough Tendon Exposed: No Muscle Exposed: No Joint Exposed: No Bone Exposed: No Limited to Skin Breakdown Periwound Skin Texture Texture Color No Abnormalities Noted: No No Abnormalities Noted: No Callus: No Atrophie Blanche: No Crepitus: No Cyanosis: No Excoriation: No Ecchymosis: No Fluctuance: No Erythema: No Friable: No Hemosiderin Staining: No Induration: No Mottled: No Localized Edema: No Pallor: No Rash: No Rubor: No Scarring: Yes Temperature / Pain Moisture Temperature: No Abnormality No Abnormalities Noted: No Tenderness on Palpation: Yes Dry / Scaly: No Maceration: No Moist: Yes Wound Preparation Ulcer Cleansing: Other: soap and water, Topical Anesthetic Applied: Other: lidocaine 4%, Treatment Notes Wound #3 (Left, Lateral  Lower Leg) 1. Cleansed with: Cleanse wound with antibacterial soap and water 2. Anesthetic Topical Lidocaine 4% cream to wound bed prior to debridement 3. Peri-wound Care: Barrier cream 4. Dressing Applied: Iodoflex 7. Secured with [employer] Bilaterally Notes [name], Mckensey H. ([id number]) TCA ointment, Nystatin cream Electronic Signature(s) Signed: 09/19/2015 5:37:49 PM By: Alejandro MullingPinkerton, Debra Entered By: Alejandro MullingPinkerton, Debra on 09/19/2015 08:39:15 Melgarejo, Judea H. ([id number]) -------------------------------------------------------------------------------- Wound Assessment Details Patient Name: Troup, Avleen H. Date of Service: 09/19/2015 8:00 AM Medical Record Patient Account Number: [id number] Number: Treating RN: Phillis Haggisinkerton, Debi [birth year] (59 y.o. Other Clinician: Date of Birth/Sex: Female) Treating ROBSON, MICHAEL Primary Care Physician/Extender: Comer LocketG HAWKINS JR, JAMES Physician: Referring Physician: Jones BroomHAWKINS JR, JAMES Weeks in Treatment: 33 Wound Status Wound Number: 8 Primary Etiology: Venous Leg Ulcer Wound Location: Right Lower Leg - Posterior Wound Status: Open Wounding Event: Gradually Appeared Comorbid History: Hypertension, Type II Diabetes  Date Acquired: 03/27/2015 Weeks Of Treatment: 24 Clustered Wound: No Photos Photo Uploaded By: Alejandro Mulling on 09/19/2015 17:29:46 Wound Measurements Length: (cm) 0.2 Width: (cm) 0.2 Depth: (cm) 0.1 Area: (cm) 0.031 Volume: (cm) 0.003 % Reduction in Area: 98.2% % Reduction in Volume: 98.3% Epithelialization: Small (1-33%) Tunneling: No Undermining: No Wound Description Full Thickness Without Foul Odor Afte Classification: Exposed Support Structures Diabetic Severity Grade 1 (Wagner): Wound Margin: Flat and Intact Exudate Amount: Large Exudate Type: Serous Exudate Color: amber Nova, Kimba H. ([id number]) r Cleansing: No Wound Bed Granulation Amount: Small (1-33%) Exposed  Structure Granulation Quality: Red, Hyper-granulation Fascia Exposed: No Necrotic Amount: Small (1-33%) Fat Layer Exposed: No Necrotic Quality: Eschar, Adherent Slough Tendon Exposed: No Muscle Exposed: No Joint Exposed: No Bone Exposed: No Limited to Skin Breakdown Periwound Skin Texture Texture Color No Abnormalities Noted: No No Abnormalities Noted: No Callus: No Atrophie Blanche: No Crepitus: No Cyanosis: No Excoriation: No Ecchymosis: No Fluctuance: No Erythema: No Friable: No Hemosiderin Staining: No Induration: No Mottled: No Localized Edema: Yes Pallor: No Rash: No Rubor: No Scarring: No Temperature / Pain Moisture Temperature: No Abnormality No Abnormalities Noted: No Tenderness on Palpation: Yes Dry / Scaly: No Maceration: No Moist: Yes Wound Preparation Ulcer Cleansing: Other: soap and water, Topical Anesthetic Applied: Other: lidocaine 4%, Treatment Notes Wound #8 (Right, Posterior Lower Leg) 1. Cleansed with: Cleanse wound with antibacterial soap and water 2. Anesthetic Topical Lidocaine 4% cream to wound bed prior to debridement 3. Peri-wound Care: Barrier cream 4. Dressing Applied: Iodoflex 7. Secured with [employer] Bilaterally Notes NELLE, SAYED. ([id number]) TCA ointment, Nystatin cream Electronic Signature(s) Signed: 09/19/2015 5:37:49 PM By: Alejandro Mulling Entered By: Alejandro Mulling on 09/19/2015 08:43:04 Messing, Sharnette H. ([id number]) -------------------------------------------------------------------------------- Vitals Details Patient Name: Nyce, Hanah H. Date of Service: 09/19/2015 8:00 AM Medical Record Patient Account Number: [id number] Number: Treating RN: Phillis Haggis 02/08/1956 (59 y.o. Other Clinician: Date of Birth/Sex: Female) Treating ROBSON, MICHAEL Primary Care Physician/Extender: Comer Locket Physician: Referring Physician: Jones Broom in Treatment:  6 Vital Signs Time Taken: 08:16 Temperature (F): 98.1 Height (in): 68 Pulse (bpm): 73 Weight (lbs): 294 Respiratory Rate (breaths/min): 20 Body Mass Index (BMI): 44.7 Blood Pressure (mmHg): 161/92 Reference Range: 80 - 120 mg / dl Electronic Signature(s) Signed: 09/19/2015 5:37:49 PM By: Alejandro Mulling Entered By: Alejandro Mulling on 09/19/2015 08:20:06

## 2015-09-21 NOTE — Progress Notes (Signed)
[name], [name] ([id number]) Visit Report for 09/19/2015 Chief Complaint Document Details Patient Name: [name], [name]. Date of Service: 09/19/2015 8:00 AM Medical Record Patient Account Number: [id number] Number: Treating RN: Phillis Haggis Sep 20, 1955 (60 y.o. Other Clinician: Date of Birth/Sex: Female) Treating Dajanique Robley Primary Care Physician/Extender: Comer Locket Physician: Referring Physician: Jones Broom in Treatment: 62 Information Obtained from: Patient Chief Complaint Chronic bilateral calf ulcers. Electronic Signature(s) Signed: 09/20/2015 4:53:46 PM By: Baltazar Najjar MD Entered By: Baltazar Najjar on 09/19/2015 10:56:23 Myricks, [name] ([id number]) -------------------------------------------------------------------------------- HPI Details Patient Name: [name], [name] Date of Service: 09/19/2015 8:00 AM Medical Record Patient Account Number: [id number] Number: Treating RN: Phillis Haggis 1955-08-29 (60 y.o. Other Clinician: Date of Birth/Sex: Female) Treating Natsuko Kelsay Primary Care Physician/Extender: Comer Locket Physician: Referring Physician: Jones Broom in Treatment: 30 History of Present Illness HPI Description: Pleasant 60 year old with h/o DM (Hgb A1c 7.4 in Aug 2016), ESRD (on hemodialysis since July 2016). Presented to her PCP, Dr. Venora Maples, with BLE ulcers since June 2016. 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 Left lower extremity ultrasound in May 2016 showed no evidence for DVT. No assessment for venous insufficiency. Culture 04/19/2015 grew methicillin sensitive staph aureus (sensitive to tetracycline), Stenotrophomonas maltophilia, and Enterococcus faecalis. Completed course of doxycycline. Hospitalized in Nov 2016 for worsening cellulitis.  Treated with IV antibiotics. No operative debridement or biopsy. Punch biopsy of left calf ulcer 06/28/2015 showed no evidence for malignancy. Bilateral lower extremity venous ultrasound 07/20/2015 showed no evidence for DVT or chronic venous insufficiency. Performing dressing changes with silver alginate. Tolerating Profore light bilaterally 3x/week. She returns to clinic and is without complaints. No significant pain. No fever or chills. Less drainage. 08/09/2015 -- details over the last several months noted. Continues to stay hemodialysis 3 times a week. Says she is improving slowly and the pain is much better 08/16/15; this patient has wounds on her bilateral lower extremities which is been present since June 2016. The exact etiology of this is not really clear although she was hospitalized in October for cellulitis. I'm not sure if this was felt to be bilateral. In any case she had a debridement last week. She is using Aquacel Ag. Per the nursing staff the wounds continue to do nicely 08/22/15; patient's wounds were covered in a surface eschar with surrounding slough . All of this underwent a surgical debridement including slough over the actual wound bed removing subcutaneous tissue. The wounds on the right lateral leg and right lateral leg all have improved quite nicely. She has been using Aquacel Ag 08/29/15 patient has bilateral leg wounds which are probably largely venous insufficiency. She does not have infection. All the wounds underwent surgical debridement with a curette. This was to remove fibrinous surface slough and circumferential callus and nonviable subcutaneous tissue and skin. She tolerates this quite well. I changed her to Iodoflex last week. Dressings are [facility] 09/06/15; her wounds continued to improve both in appearance and in volume. We had reduced her from a Profore to a Profore light last week due to blistering on her skin. The blistering is  better but her edema control is not so good. She still has itchy excoriated scan which makes me wonder whether she might be reacting to the surface layer of the Profore wraps. Her wounds required surgical debridement but again both  of them look better. CALIANA, SPIRES ([id number]) 09/12/15 continued improvement in the appearance of the wounds undergoing a surgical debridement of circumferential callus, fibrinous surface slough and nonviable subcutaneous tissue. Her skin is a lot better leading me to believe she had some form of contact dermatitis with the skin contact layer of both the Profore in Profore light wraps. 09/19/15 the patient's wounds which are predominantly on the left anterior lateral leg are improved no debridement was required. On the right anterior lateral leg she has a very small open wound with some depth and a superficial wound posterior to that. We have been using Iodoflex Electronic Signature(s) Signed: 09/20/2015 4:53:46 PM By: Baltazar Najjar MD Entered By: Baltazar Najjar on 09/19/2015 10:58:33 Withey, [name] ([id number]) -------------------------------------------------------------------------------- Physical Exam Details Patient Name: [name], [name] Date of Service: 09/19/2015 8:00 AM Medical Record Patient Account Number: [id number] Number: Treating RN: Phillis Haggis 05/25/1956 (60 y.o. Other Clinician: Date of Birth/Sex: Female) Treating Danniela Mcbrearty Primary Care Physician/Extender: Comer Locket Physician: Referring Physician: Jones Broom in Treatment: 33 Notes On exam; the area on the left lateral and right lateral lower legs appear to be improving. There is a small pinpoint area on the right which will not be possible to put any form of dressing in. We'll therefore use Iodosorb continue Iodoflex to 2 wounds on the anterior lateral left leg and the remaining wound on the anterior lateral right leg. Her edema  control is satisfactory. The contact dermatitis from presumably an allergy to the contact layer of the Profore-type dressings has resolved Electronic Signature(s) Signed: 09/20/2015 4:53:46 PM By: Baltazar Najjar MD Entered By: Baltazar Najjar on 09/19/2015 10:59:51 Ramroop, [name] ([id number]) -------------------------------------------------------------------------------- Physician Orders Details Patient Name: Mannella, Kiriana H. Date of Service: 09/19/2015 8:00 AM Medical Record Patient Account Number: [id number] Number: Treating RN: Phillis Haggis 1956/03/31 (60 y.o. Other Clinician: Date of Birth/Sex: Female) Treating Monta Police Primary Care Physician/Extender: Comer Locket Physician: Referring Physician: Jones Broom in Treatment: 40 Verbal / Phone Orders: Yes Clinician: Ashok Cordia, Debi Read Back and Verified: Yes Diagnosis Coding Wound Cleansing Wound #1 Right,Anterior Lower Leg o Clean wound with Normal Saline. o Cleanse wound with mild soap and water o May Shower, gently pat wound dry prior to applying new dressing. Wound #3 Left,Lateral Lower Leg o Clean wound with Normal Saline. o Cleanse wound with mild soap and water o May Shower, gently pat wound dry prior to applying new dressing. Wound #8 Right,Posterior Lower Leg o Clean wound with Normal Saline. o Cleanse wound with mild soap and water o May Shower, gently pat wound dry prior to applying new dressing. Anesthetic Wound #1 Right,Anterior Lower Leg o Topical Lidocaine 4% cream applied to wound bed prior to debridement Wound #3 Left,Lateral Lower Leg o Topical Lidocaine 4% cream applied to wound bed prior to debridement Wound #8 Right,Posterior Lower Leg o Topical Lidocaine 4% cream applied to wound bed prior to debridement Skin Barriers/Peri-Wound Care Wound #1 Right,Anterior Lower Leg o Antifungal cream - please use on legs not on wounds, and  TCA o Barrier cream - please use barrier cream around wounds Wound #3 Left,Lateral Lower Leg o Antifungal cream - please use on legs not on wounds, and TCA o Barrier cream Bookbinder, Henley H. ([id number]) Wound #8 Right,Posterior Lower Leg o Antifungal cream - please use on legs not on wounds, and TCA o Barrier cream Primary Wound Dressing Wound #1 Right,Anterior Lower Leg o Iodosorb Ointment  Wound #3 Left,Lateral Lower Leg o Iodoflex Wound #8 Right,Posterior Lower Leg o Iodoflex Dressing Change Frequency Wound #1 Right,Anterior Lower Leg o Change dressing every week Wound #3 Left,Lateral Lower Leg o Change dressing every week Wound #8 Right,Posterior Lower Leg o Change dressing every week Follow-up Appointments Wound #1 Right,Anterior Lower Leg o Return Appointment in 1 week. o Return Appointment in 1 week. Wound #3 Left,Lateral Lower Leg o Return Appointment in 1 week. Wound #8 Right,Posterior Lower Leg o Return Appointment in 1 week. Edema Control Wound #1 Right,Anterior Lower Leg o Unna Boots Bilaterally o Elevate legs to the level of the heart and pump ankles as often as possible - Elevate legs at Dialysis Wound #3 Left,Lateral Lower Leg o Unna Boots Bilaterally o Elevate legs to the level of the heart and pump ankles as often as possible - Elevate legs at Dialysis Wound #8 Right,Posterior Lower Leg Spielmann, Meredyth H. ([id number]) o Unna Boots Bilaterally o Elevate legs to the level of the heart and pump ankles as often as possible - Elevate legs at Dialysis Off-Loading Wound #1 Right,Anterior Lower Leg o Turn and reposition every 2 hours Wound #3 Left,Lateral Lower Leg o Turn and reposition every 2 hours Wound #8 Right,Posterior Lower Leg o Turn and reposition every 2 hours Additional Orders / Instructions Wound #1 Right,Anterior Lower Leg o Activity as tolerated Wound #3 Left,Lateral Lower Leg o Activity  as tolerated Wound #8 Right,Posterior Lower Leg o Activity as tolerated Medications-please add to medication list. Wound #1 Right,Anterior Lower Leg o Other: - Antifungal Cream to bilateral lower legs, NOT ON WOUNDS, and TCA. Electronic Signature(s) Signed: 09/19/2015 5:37:49 PM By: Alejandro Mulling Signed: 09/20/2015 4:53:46 PM By: Baltazar Najjar MD Entered By: Alejandro Mulling on 09/19/2015 09:45:53 Spadaccini, [name] ([id number]) -------------------------------------------------------------------------------- Problem List Details Patient Name: Ryback, Tasheena H. Date of Service: 09/19/2015 8:00 AM Medical Record Patient Account Number: [id number] Number: Treating RN: Phillis Haggis 27-Apr-1956 (60 y.o. 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 Date of Service: 09/19/2015 8:00 AM Medical Record Patient Account Number: [id number] Number: Treating RN: Phillis Haggis [birth year] (60 y.o. Other Clinician: Date of Birth/Sex: Female)  Treating Dawne Casali Primary Care Physician/Extender: Comer Locket Physician: Referring Physician: Jones Broom in Treatment: 81 Subjective Chief Complaint Information obtained from Patient Chronic bilateral calf ulcers. History of Present Illness (HPI) Pleasant 60 year old with h/o DM (Hgb A1c 7.4 in Aug 2016), ESRD (on hemodialysis since July 2016). Presented to her PCP, Dr. Venora Maples, with BLE ulcers since June 2016. 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 Left lower extremity ultrasound in May 2016 showed no evidence for DVT. No assessment for venous insufficiency. Culture 04/19/2015 grew methicillin sensitive staph aureus (  sensitive to tetracycline), Stenotrophomonas maltophilia, and Enterococcus faecalis. Completed course of doxycycline. Hospitalized in Nov 2016 for worsening cellulitis. Treated with IV antibiotics. No operative debridement or biopsy. Punch biopsy of left calf ulcer 06/28/2015 showed no evidence for malignancy. Bilateral lower extremity venous ultrasound 07/20/2015 showed no evidence for DVT or chronic venous insufficiency. Performing dressing changes with silver alginate. Tolerating Profore light bilaterally 3x/week. She returns to clinic and is without complaints. No significant pain. No fever or chills. Less drainage. 08/09/2015 -- details over the last several months noted. Continues to stay hemodialysis 3 times a week. Says she is improving slowly and the pain is much better 08/16/15; this patient has wounds on her bilateral lower extremities which is been present since June 2016. The exact etiology of this is not really clear although she was hospitalized in October for cellulitis. I'm not sure if this was felt to be bilateral. In any case she had a debridement last week. She is using Aquacel Ag. Per the nursing staff  the wounds continue to do nicely 08/22/15; patient's wounds were covered in a surface eschar with surrounding slough . All of this underwent a surgical debridement including slough over the actual wound bed removing subcutaneous tissue. The wounds on the right lateral leg and right lateral leg all have improved quite nicely. She has been using Aquacel Ag 08/29/15 patient has bilateral leg wounds which are probably largely venous insufficiency. She does not have infection. All the wounds underwent surgical debridement with a curette. This was to remove fibrinous surface slough and circumferential callus and nonviable subcutaneous tissue and skin. She tolerates this quite well. I changed her to Iodoflex last week. Dressings are being changed by [facility] Snuffer, Manpreet H. ([id number]) 09/06/15; her wounds continued to improve both in appearance and in volume. We had reduced her from a Profore to a Profore light last week due to blistering on her skin. The blistering is better but her edema control is not so good. She still has itchy excoriated scan which makes me wonder whether she might be reacting to the surface layer of the Profore wraps. Her wounds required surgical debridement but again both of them look better. 09/12/15 continued improvement in the appearance of the wounds undergoing a surgical debridement of circumferential callus, fibrinous surface slough and nonviable subcutaneous tissue. Her skin is a lot better leading me to believe she had some form of contact dermatitis with the skin contact layer of both the Profore in Profore light wraps. 09/19/15 the patient's wounds which are predominantly on the left anterior lateral leg are improved no debridement was required. On the right anterior lateral leg she has a very small open wound with some depth and a superficial wound posterior to that. We have been using Iodoflex Objective Constitutional Vitals Time Taken: 8:16 AM, Height: 68  in, Weight: 294 lbs, BMI: 44.7, Temperature: 98.1 F, Pulse: 73 bpm, Respiratory Rate: 20 breaths/min, Blood Pressure: 161/92 mmHg. Integumentary (Hair, Skin) Wound #1 status is Open. Original cause of wound was Gradually Appeared. The wound is located on the Right,Anterior Lower Leg. The wound measures 0.1cm length x 0.1cm width x 0.2cm depth; 0.008cm^2 area and 0.002cm^3 volume. The wound is limited to skin breakdown. There is no tunneling or undermining noted. There is a large amount of serous drainage noted. The wound margin is thickened. There is large (67-100%) pink granulation within the wound bed. There is a small (1-33%) amount of necrotic tissue within the wound bed including Eschar and Adherent  Slough. The periwound skin appearance exhibited: Scarring, Moist. The periwound skin appearance did not exhibit: Callus, Crepitus, Excoriation, Fluctuance, Friable, Induration, Localized Edema, Rash, Dry/Scaly, Maceration, Atrophie Blanche, Cyanosis, Ecchymosis, Hemosiderin Staining, Mottled, Pallor, Rubor, Erythema. Periwound temperature was noted as No Abnormality. The periwound has tenderness on palpation. Wound #3 status is Open. Original cause of wound was Gradually Appeared. The wound is located on the Left,Lateral Lower Leg. The wound measures 7cm length x 2cm width x 0.2cm depth; 10.996cm^2 area and 2.199cm^3 volume. The wound is limited to skin breakdown. There is no tunneling or undermining noted. There is a large amount of serosanguineous drainage noted. The wound margin is flat and intact. There is large (67-100%) red granulation within the wound bed. There is a small (1-33%) amount of necrotic tissue within the wound bed including Adherent Slough. The periwound skin appearance exhibited: Scarring, Moist. The periwound skin appearance did not exhibit: Callus, Crepitus, Excoriation, Fluctuance, Friable, Induration, Localized Edema, Rash, Dry/Scaly, Maceration, Atrophie Blanche,  Cyanosis, Ecchymosis, Hemosiderin Staining, Mottled, Pallor, Rubor, Erythema. Periwound temperature was noted as No Abnormality. The periwound has tenderness on palpation. Wound #8 status is Open. Original cause of wound was Gradually Appeared. The wound is located on the Right,Posterior Lower Leg. The wound measures 0.2cm length x 0.2cm width x 0.1cm depth; 0.031cm^2 Alvis, Nocole H. ([id number]) area and 0.003cm^3 volume. The wound is limited to skin breakdown. There is no tunneling or undermining noted. There is a large amount of serous drainage noted. The wound margin is flat and intact. There is small (1-33%) red granulation within the wound bed. There is a small (1-33%) amount of necrotic tissue within the wound bed including Eschar and Adherent Slough. The periwound skin appearance exhibited: Localized Edema, Moist. The periwound skin appearance did not exhibit: Callus, Crepitus, Excoriation, Fluctuance, Friable, Induration, Rash, Scarring, Dry/Scaly, Maceration, Atrophie Blanche, Cyanosis, Ecchymosis, Hemosiderin Staining, Mottled, Pallor, Rubor, Erythema. Periwound temperature was noted as No Abnormality. The periwound has tenderness on palpation. Assessment Active Problems ICD-10 E11.622 - Type 2 diabetes mellitus with other skin ulcer N18.6 - End stage renal disease L97.222 - Non-pressure chronic ulcer of left calf with fat layer exposed L97.212 - Non-pressure chronic ulcer of right calf with fat layer exposed R60.0 - Localized edema I89.0 - Lymphedema, not elsewhere classified Plan Wound Cleansing: Wound #1 Right,Anterior Lower Leg: Clean wound with Normal Saline. Cleanse wound with mild soap and water May Shower, gently pat wound dry prior to applying new dressing. Wound #3 Left,Lateral Lower Leg: Clean wound with Normal Saline. Cleanse wound with mild soap and water May Shower, gently pat wound dry prior to applying new dressing. Wound #8 Right,Posterior Lower  Leg: Clean wound with Normal Saline. Cleanse wound with mild soap and water May Shower, gently pat wound dry prior to applying new dressing. Anesthetic: Wound #1 Right,Anterior Lower Leg: Topical Lidocaine 4% cream applied to wound bed prior to debridement Wound #3 Left,Lateral Lower Leg: Topical Lidocaine 4% cream applied to wound bed prior to debridement Wound #8 Right,Posterior Lower Leg: Eichelberger, Alex H. ([id number]) Topical Lidocaine 4% cream applied to wound bed prior to debridement Skin Barriers/Peri-Wound Care: Wound #1 Right,Anterior Lower Leg: Antifungal cream - please use on legs not on wounds, and TCA Barrier cream - please use barrier cream around wounds Wound #3 Left,Lateral Lower Leg: Antifungal cream - please use on legs not on wounds, and TCA Barrier cream Wound #8 Right,Posterior Lower Leg: Antifungal cream - please use on legs not on wounds, and TCA Barrier cream  Primary Wound Dressing: Wound #1 Right,Anterior Lower Leg: Iodosorb Ointment Wound #3 Left,Lateral Lower Leg: Iodoflex Wound #8 Right,Posterior Lower Leg: Iodoflex Dressing Change Frequency: Wound #1 Right,Anterior Lower Leg: Change dressing every week Wound #3 Left,Lateral Lower Leg: Change dressing every week Wound #8 Right,Posterior Lower Leg: Change dressing every week Follow-up Appointments: Wound #1 Right,Anterior Lower Leg: Return Appointment in 1 week. Return Appointment in 1 week. Wound #3 Left,Lateral Lower Leg: Return Appointment in 1 week. Wound #8 Right,Posterior Lower Leg: Return Appointment in 1 week. Edema Control: Wound #1 Right,Anterior Lower Leg: Unna Boots Bilaterally Elevate legs to the level of the heart and pump ankles as often as possible - Elevate legs at Dialysis Wound #3 Left,Lateral Lower Leg: Unna Boots Bilaterally Elevate legs to the level of the heart and pump ankles as often as possible - Elevate legs at Dialysis Wound #8 Right,Posterior Lower Leg: Unna  Boots Bilaterally Elevate legs to the level of the heart and pump ankles as often as possible - Elevate legs at Dialysis Off-Loading: Wound #1 Right,Anterior Lower Leg: Turn and reposition every 2 hours Wound #3 Left,Lateral Lower Leg: Turn and reposition every 2 hours Wound #8 Right,Posterior Lower Leg: Turn and reposition every 2 hours Additional Orders / InstructionsCHERYE, GAERTNER ([id number]) Wound #1 Right,Anterior Lower Leg: Activity as tolerated Wound #3 Left,Lateral Lower Leg: Activity as tolerated Wound #8 Right,Posterior Lower Leg: Activity as tolerated Medications-please add to medication list.: Wound #1 Right,Anterior Lower Leg: Other: - Antifungal Cream to bilateral lower legs, NOT ON WOUNDS, and TCA. Iodosorb ointment to the pinpoint area on the right. Iodoflex to the remainng wounds. Umma wrap Electronic Signature(s) Signed: 09/20/2015 4:53:46 PM By: Baltazar Najjar MD Entered By: Baltazar Najjar on 09/19/2015 11:01:13 Mcclure, [name] ([id number]) -------------------------------------------------------------------------------- SuperBill Details Patient Name: Dillow, Khelani H. Date of Service: 09/19/2015 Medical Record Patient Account Number: [id number] Number: Treating RN: Phillis Haggis Apr 08, 1956 (60 y.o. Other Clinician: Date of Birth/Sex: Female) Treating Candise Crabtree Primary Care Physician/Extender: Comer Locket Physician: Weeks in Treatment: 33 Referring Physician: Fidel Levy Diagnosis Coding ICD-10 Codes Code Description [phone] Type 2 diabetes mellitus with other skin ulcer N18.6 End stage renal disease L97.222 Non-pressure chronic ulcer of left calf with fat layer exposed L97.212 Non-pressure chronic ulcer of right calf with fat layer exposed R60.0 Localized edema I89.0 Lymphedema, not elsewhere classified Facility Procedures CPT4 Code: [id number] Description: 29580 - APPLY UNNA BOOT/PROFO  BILATERAL Modifier: Quantity: 1 Physician Procedures CPT4 Code: [id number] Description: 99202 - WC PHYS LEVEL 2 - NEW PT ICD-10 Description Diagnosis L97.222 Non-pressure chronic ulcer of left calf with fat l Modifier: ayer exposed Quantity: 1 Electronic Signature(s) Signed: 09/19/2015 5:37:49 PM By: Alejandro Mulling Signed: 09/20/2015 4:53:46 PM By: Baltazar Najjar MD Entered By: Alejandro Mulling on 09/19/2015 17:21:25

## 2015-09-25 DIAGNOSIS — Z992 Dependence on renal dialysis: Secondary | ICD-10-CM | POA: Diagnosis not present

## 2015-09-25 DIAGNOSIS — D509 Iron deficiency anemia, unspecified: Secondary | ICD-10-CM | POA: Diagnosis not present

## 2015-09-25 DIAGNOSIS — N2581 Secondary hyperparathyroidism of renal origin: Secondary | ICD-10-CM | POA: Diagnosis not present

## 2015-09-25 DIAGNOSIS — N186 End stage renal disease: Secondary | ICD-10-CM | POA: Diagnosis not present

## 2015-09-26 ENCOUNTER — Encounter: Payer: BC Managed Care – PPO | Admitting: Internal Medicine

## 2015-09-26 DIAGNOSIS — I87313 Chronic venous hypertension (idiopathic) with ulcer of bilateral lower extremity: Secondary | ICD-10-CM | POA: Diagnosis not present

## 2015-09-26 DIAGNOSIS — L97821 Non-pressure chronic ulcer of other part of left lower leg limited to breakdown of skin: Secondary | ICD-10-CM | POA: Diagnosis not present

## 2015-09-26 DIAGNOSIS — L97811 Non-pressure chronic ulcer of other part of right lower leg limited to breakdown of skin: Secondary | ICD-10-CM | POA: Diagnosis not present

## 2015-09-26 DIAGNOSIS — E11622 Type 2 diabetes mellitus with other skin ulcer: Secondary | ICD-10-CM | POA: Diagnosis not present

## 2015-09-28 NOTE — Progress Notes (Signed)
[name], Bellina H. ([id number]) Visit Report for 09/26/2015 Arrival Information Details Patient Name: [name], [name] Date of Service: 09/26/2015 8:00 AM Medical Record Patient Account Number: [id number] Number: Treating RN: Phillis Haggisinkerton, Debi 1955-10-22 (60 y.o. Other Clinician: Date of Birth/Sex: Female) Treating ROBSON, MICHAEL Primary Care Physician/Extender: Comer LocketG HAWKINS JR, JAMES Physician: Referring Physician: Jones BroomHAWKINS JR, JAMES Weeks in Treatment: 34 Visit Information History Since Last Visit All ordered tests and consults were completed: No Patient Arrived: Ambulatory Added or deleted any medications: No Arrival Time: 08:03 Any new allergies or adverse reactions: No Accompanied By: self Had a fall or experienced change in No Transfer Assistance: None activities of daily living that may affect Patient Identification Verified: Yes risk of falls: Secondary Verification Process Yes Signs or symptoms of abuse/neglect since last No Completed: visito Patient Requires Transmission- No Hospitalized since last visit: No Based Precautions: Pain Present Now: No Patient Has Alerts: Yes Patient Alerts: DMII ABI [place] Bilateral Electronic Signature(s) Signed: 09/27/2015 4:57:15 PM By: Alejandro MullingPinkerton, Debra Entered By: Alejandro MullingPinkerton, Debra on 09/26/2015 08:04:14 [name], [name] ([id number]) -------------------------------------------------------------------------------- Encounter Discharge Information Details Patient Name: [name], [name] Date of Service: 09/26/2015 8:00 AM Medical Record Patient Account Number: [id number] Number: Treating RN: Phillis Haggisinkerton, Debi 1955-10-22 (60 y.o. Other Clinician: Date of Birth/Sex: Female) Treating ROBSON, MICHAEL Primary Care Physician/Extender: Comer LocketG HAWKINS JR, JAMES Physician: Referring Physician: Jones BroomHAWKINS JR, JAMES Weeks in Treatment: 6534 Encounter Discharge Information Items Discharge Pain Level: 0 Discharge Condition:  Stable Ambulatory Status: Ambulatory Discharge Destination: Home Private Transportation: Auto Accompanied By: self Schedule Follow-up Appointment: Yes Medication Reconciliation completed and Yes provided to Patient/Care Makalynn Berwanger: Clinical Summary of Care: Electronic Signature(s) Signed: 09/27/2015 4:57:15 PM By: Alejandro MullingPinkerton, Debra Entered By: Alejandro MullingPinkerton, Debra on 09/26/2015 08:36:56 [name], [name] ([id number]) -------------------------------------------------------------------------------- Lower Extremity Assessment Details Patient Name: [name], [name] Date of Service: 09/26/2015 8:00 AM Medical Record Patient Account Number: [id number] Number: Treating RN: Phillis Haggisinkerton, Debi 1955-10-22 (60 y.o. Other Clinician: Date of Birth/Sex: Female) Treating ROBSON, MICHAEL Primary Care Physician/Extender: Comer LocketG HAWKINS JR, JAMES Physician: Referring Physician: Jones BroomHAWKINS JR, JAMES Weeks in Treatment: 34 Edema Assessment Assessed: [Left: No] [Right: No] E[Left: dema] [Right: :] Calf Left: Right: Point of Measurement: cm From Medial Instep 42 cm 39.5 cm Ankle Left: Right: Point of Measurement: cm From Medial Instep 26.5 cm 24.5 cm Vascular Assessment Pulses: Posterior Tibial Dorsalis Pedis Palpable: [Left:Yes] [Right:Yes] Extremity colors, hair growth, and conditions: Extremity Color: [Left:Red] [Right:Red] Temperature of Extremity: [Left:Warm] [Right:Warm] Capillary Refill: [Left:< 3 seconds] [Right:< 3 seconds] Toe Nail Assessment Left: Right: Thick: No No Discolored: No No Deformed: No No Improper Length and Hygiene: No No Electronic Signature(s) Signed: 09/27/2015 4:57:15 PM By: Alejandro MullingPinkerton, Debra Entered By: Alejandro MullingPinkerton, Debra on 09/26/2015 08:13:46 [name], [name] ([id number]) [name], [name] ([id number]) -------------------------------------------------------------------------------- Multi Wound Chart Details Patient Name: [name], [name] Date of  Service: 09/26/2015 8:00 AM Medical Record Patient Account Number: [id number] Number: Treating RN: Phillis Haggisinkerton, Debi 1955-10-22 (60 y.o. Other Clinician: Date of Birth/Sex: Female) Treating ROBSON, MICHAEL Primary Care Physician/Extender: Comer LocketG HAWKINS JR, JAMES Physician: Referring Physician: Jones BroomHAWKINS JR, JAMES Weeks in Treatment: 34 Vital Signs Height(in): 68 Pulse(bpm): 85 Weight(lbs): 294 Blood Pressure 153/75 (mmHg): Body Mass Index(BMI): 45 Temperature(F): 98.1 Respiratory Rate 20 (breaths/min): Photos: [1:No Photos] [3:No Photos] [8:No Photos] Wound Location: [1:Right Lower Leg - Anterior Left Lower Leg - Lateral] [8:Right Lower Leg - Posterior] Wounding Event: [1:Gradually Appeared] [3:Gradually Appeared] [8:Gradually Appeared] Primary Etiology: [1:Venous Leg Ulcer] [3:Venous Leg Ulcer] [8:Venous Leg Ulcer] Comorbid History: [1:Hypertension,  Type II Diabetes] [3:Hypertension, Type II Diabetes] [8:Hypertension, Type II Diabetes] Date Acquired: [1:01/09/2015] [3:01/09/2015] [8:03/27/2015] Weeks of Treatment: [1:34] [3:34] [8:25] Wound Status: [1:Open] [3:Open] [8:Open] Measurements L x W x D 0.1x0.1x0.1 [3:7x2x0.1] [8:2.5x1x0.1] (cm) Area (cm) : [1:0.008] [3:10.996] [8:1.963] Volume (cm) : [1:0.001] [3:1.1] [8:0.196] % Reduction in Area: [1:83.00%] [3:0.00%] [8:-11.10%] % Reduction in Volume: 80.00% [3:0.00%] [8:-10.70%] Classification: [1:Full Thickness Without Exposed Support Structures] [3:Full Thickness Without Exposed Support Structures] [8:Full Thickness Without Exposed Support Structures] HBO Classification: [1:Grade 2] [3:Grade 1] [8:Grade 1] Exudate Amount: [1:Large] [3:Large] [8:Large] Exudate Type: [1:Serous] [3:Serosanguineous] [8:Serous] Exudate Color: [1:amber] [3:red, brown] [8:amber] Wound Margin: [1:Thickened] [3:Flat and Intact] [8:Flat and Intact] Granulation Amount: [1:None Present (0%)] [3:Large (67-100%)] [8:Large (67-100%)] Granulation  Quality: [1:N/A] [3:Red] [8:Red, Hyper-granulation] Necrotic Amount: [1:Large (67-100%)] [3:Small (1-33%)] [8:Small (1-33%)] Necrotic Tissue: Eschar, Adherent Slough Adherent [name], Adherent Slough Exposed Structures: Fascia: No Fascia: No Fascia: No Fat: No Fat: No Fat: No Tendon: No Tendon: No Tendon: No Muscle: No Muscle: No Muscle: No Joint: No Joint: No Joint: No Bone: No Bone: No Bone: No Limited to Skin Limited to Skin Limited to Skin Breakdown Breakdown Breakdown Epithelialization: Small (1-33%) Small (1-33%) None Periwound Skin Texture: Scarring: Yes Scarring: Yes Edema: Yes Edema: No Edema: No Excoriation: No Excoriation: No Excoriation: No Induration: No Induration: No Induration: No Callus: No Callus: No Callus: No Crepitus: No Crepitus: No Crepitus: No Fluctuance: No Fluctuance: No Fluctuance: No Friable: No Friable: No Friable: No Rash: No Rash: No Rash: No Scarring: No Periwound Skin Moist: Yes Moist: Yes Moist: Yes Moisture: Maceration: No Maceration: No Maceration: No Dry/Scaly: No Dry/Scaly: No Dry/Scaly: No Periwound Skin Color: Atrophie Blanche: No Atrophie Blanche: No Atrophie Blanche: No Cyanosis: No Cyanosis: No Cyanosis: No Ecchymosis: No Ecchymosis: No Ecchymosis: No Erythema: No Erythema: No Erythema: No Hemosiderin Staining: No Hemosiderin Staining: No Hemosiderin Staining: No Mottled: No Mottled: No Mottled: No Pallor: No Pallor: No Pallor: No Rubor: No Rubor: No Rubor: No Temperature: No Abnormality No Abnormality No Abnormality Tenderness on Yes Yes Yes Palpation: Wound Preparation: Ulcer Cleansing: Other: Ulcer Cleansing: Other: Ulcer Cleansing: Other: soap and water soap and water soap and water Topical Anesthetic Topical Anesthetic Topical Anesthetic Applied: Other: lidocaine Applied: Other: lidocaine Applied: Other: lidocaine 4% 4% 4% Treatment Notes Electronic Signature(s) Signed:  09/27/2015 4:57:15 PM By: Alejandro Mulling Entered By: Alejandro Mulling on 09/26/2015 08:25:00 Faw, [name] ([id number]) -------------------------------------------------------------------------------- Multi-Disciplinary Care Plan Details Patient Name: [name], [name] Date of Service: 09/26/2015 8:00 AM Medical Record Patient Account Number: [id number] Number: Treating RN: Phillis Haggis 1956/04/11 (59 y.o. Other Clinician: Date of Birth/Sex: Female) Treating ROBSON, MICHAEL Primary Care Physician/Extender: Comer Locket Physician: Referring Physician: Jones Broom in Treatment: 28 Active Inactive Orientation to the Wound Care Program Nursing Diagnoses: Knowledge deficit related to the wound healing center program Goals: Patient/caregiver will verbalize understanding of the Wound Healing Center Program Date Initiated: 01/30/2015 Goal Status: Active Interventions: Provide education on orientation to the wound center Notes: Pain, Acute or Chronic Nursing Diagnoses: Pain, acute or chronic: actual or potential Goals: Patient will verbalize adequate pain control and receive pain control interventions during procedures as needed Date Initiated: 01/30/2015 Goal Status: Active Patient/caregiver will verbalize adequate pain control between visits Date Initiated: 01/30/2015 Goal Status: Active Interventions: Assess comfort goal upon admission Encourage patient to take pain medications as prescribed Notes: [name], [name] ([id number]) Venous Leg Ulcer Nursing Diagnoses: Potential for venous Insuffiency (use before diagnosis confirmed) Goals: Non-invasive  venous studies are completed as ordered Date Initiated: 01/30/2015 Goal Status: Active Interventions: Assess peripheral edema status every visit. Notes: Wound/Skin Impairment Nursing Diagnoses: Impaired tissue integrity Goals: Ulcer/skin breakdown will have a volume reduction of 30%  by week 4 Date Initiated: 01/30/2015 Goal Status: Active Ulcer/skin breakdown will have a volume reduction of 50% by week 8 Date Initiated: 01/30/2015 Goal Status: Active Ulcer/skin breakdown will have a volume reduction of 80% by week 12 Date Initiated: 01/30/2015 Goal Status: Active Ulcer/skin breakdown will heal within 14 weeks Date Initiated: 01/30/2015 Goal Status: Active Interventions: Assess ulceration(s) every visit Notes: Electronic Signature(s) Signed: 09/27/2015 4:57:15 PM By: Alejandro Mulling Entered By: Alejandro Mulling on 09/26/2015 08:24:38 [name], [name] ([id number]) -------------------------------------------------------------------------------- Pain Assessment Details Patient Name: [name], [name] Date of Service: 09/26/2015 8:00 AM Medical Record Patient Account Number: [id number] Number: Treating RN: Phillis Haggis [birth year] (59 y.o. Other Clinician: Date of Birth/Sex: Female) Treating ROBSON, MICHAEL Primary Care Physician/Extender: Comer Locket Physician: Referring Physician: Jones Broom in Treatment: 10 Active Problems Location of Pain Severity and Description of Pain Patient Has Paino No Site Locations Pain Management and Medication Current Pain Management: Electronic Signature(s) Signed: 09/27/2015 4:57:15 PM By: Alejandro Mulling Entered By: Alejandro Mulling on 09/26/2015 08:04:19 [name], [name] ([id number]) -------------------------------------------------------------------------------- Patient/Caregiver Education Details Patient Name: Voght, Taeko H. Date of Service: 09/26/2015 8:00 AM Medical Record Patient Account Number: [id number] Number: Treating RN: Phillis Haggis [birth year] (59 y.o. Other Clinician: Date of Birth/Gender: Female) Treating ROBSON, MICHAEL Primary Care Physician/Extender: Comer Locket Physician: Weeks in Treatment: 34 Referring Physician: Fidel Levy Education Assessment Education Provided To: Patient Education Topics Provided Wound/Skin Impairment: Handouts: Other: do not get wraps wet Methods: Demonstration, Explain/Verbal Responses: State content correctly Electronic Signature(s) Signed: 09/27/2015 4:57:15 PM By: Alejandro Mulling Entered By: Alejandro Mulling on 09/26/2015 08:37:10 [name], [name] H. ([id number]) -------------------------------------------------------------------------------- Wound Assessment Details Patient Name: Ruder, Yasmene H. Date of Service: 09/26/2015 8:00 AM Medical Record Patient Account Number: [id number] Number: Treating RN: Phillis Haggis [birth year] (59 y.o. Other Clinician: Date of Birth/Sex: Female) Treating ROBSON, MICHAEL Primary Care Physician/Extender: Comer Locket Physician: Referring Physician: Jones Broom in Treatment: 34 Wound Status Wound Number: 1 Primary Etiology: Venous Leg Ulcer Wound Location: Right Lower Leg - Anterior Wound Status: Open Wounding Event: Gradually Appeared Comorbid History: Hypertension, Type II Diabetes Date Acquired: 01/09/2015 Weeks Of Treatment: 34 Clustered Wound: No Photos Photo Uploaded By: Alejandro Mulling on 09/26/2015 14:12:11 Wound Measurements Length: (cm) 0.1 Width: (cm) 0.1 Depth: (cm) 0.1 Area: (cm) 0.008 Volume: (cm) 0.001 % Reduction in Area: 83% % Reduction in Volume: 80% Epithelialization: Small (1-33%) Tunneling: No Undermining: No Wound Description Full Thickness Without Foul Odor Afte Classification: Exposed Support Structures Diabetic Severity Grade 2 (Wagner): Wound Margin: Thickened Exudate Amount: Large Exudate Type: Serous Exudate Color: amber Koeller, Ramiya H. ([id number]) r Cleansing: No Wound Bed Granulation Amount: None Present (0%) Exposed Structure Necrotic Amount: Large (67-100%) Fascia Exposed: No Necrotic Quality: Eschar, Adherent Slough Fat Layer  Exposed: No Tendon Exposed: No Muscle Exposed: No Joint Exposed: No Bone Exposed: No Limited to Skin Breakdown Periwound Skin Texture Texture Color No Abnormalities Noted: No No Abnormalities Noted: No Callus: No Atrophie Blanche: No Crepitus: No Cyanosis: No Excoriation: No Ecchymosis: No Fluctuance: No Erythema: No Friable: No Hemosiderin Staining: No Induration: No Mottled: No Localized Edema: No Pallor: No Rash: No Rubor: No Scarring: Yes Temperature / Pain Moisture Temperature: No Abnormality No Abnormalities Noted:  No Tenderness on Palpation: Yes Dry / Scaly: No Maceration: No Moist: Yes Wound Preparation Ulcer Cleansing: Other: soap and water, Topical Anesthetic Applied: Other: lidocaine 4%, Treatment Notes Wound #1 (Right, Anterior Lower Leg) 1. Cleansed with: Cleanse wound with antibacterial soap and water 2. Anesthetic Topical Lidocaine 4% cream to wound bed prior to debridement 3. Peri-wound Care: Barrier cream Other peri-wound care (specify in notes) 4. Dressing Applied: Iodosorb Ointment Iodoflex 5. Secondary Dressing Applied ABD Pad Pennella, Aiyannah H. ([id number]) 7. Secured with Tape Unna Boots Bilaterally Notes Nystatin, TCA Electronic Signature(s) Signed: 09/27/2015 4:57:15 PM By: Alejandro Mulling Entered By: Alejandro Mulling on 09/26/2015 08:22:27 Gladney, Bianna Rexene [name] ([id number]) -------------------------------------------------------------------------------- Wound Assessment Details Patient Name: Millican, Zilphia H. Date of Service: 09/26/2015 8:00 AM Medical Record Patient Account Number: [id number] Number: Treating RN: Phillis Haggis 1955-11-12 (59 y.o. Other Clinician: Date of Birth/Sex: Female) Treating ROBSON, MICHAEL Primary Care Physician/Extender: Comer Locket Physician: Referring Physician: Jones Broom in Treatment: 34 Wound Status Wound Number: 3 Primary Etiology: Venous Leg  Ulcer Wound Location: Left Lower Leg - Lateral Wound Status: Open Wounding Event: Gradually Appeared Comorbid History: Hypertension, Type II Diabetes Date Acquired: 01/09/2015 Weeks Of Treatment: 34 Clustered Wound: No Photos Photo Uploaded By: Alejandro Mulling on 09/26/2015 14:12:12 Wound Measurements Length: (cm) 7 Width: (cm) 2 Depth: (cm) 0.1 Area: (cm) 10.996 Volume: (cm) 1.1 % Reduction in Area: 0% % Reduction in Volume: 0% Epithelialization: Small (1-33%) Tunneling: No Undermining: No Wound Description Full Thickness Without Exposed Foul Odor Classification: Support Structures Diabetic Severity Grade 1 (Wagner): Wound Margin: Flat and Intact Exudate Amount: Large Exudate Type: Serosanguineous Exudate Color: red, brown Breeze, Adilynne H. ([id number]) After Cleansing: No Wound Bed Granulation Amount: Large (67-100%) Exposed Structure Granulation Quality: Red Fascia Exposed: No Necrotic Amount: Small (1-33%) Fat Layer Exposed: No Necrotic Quality: Adherent Slough Tendon Exposed: No Muscle Exposed: No Joint Exposed: No Bone Exposed: No Limited to Skin Breakdown Periwound Skin Texture Texture Color No Abnormalities Noted: No No Abnormalities Noted: No Callus: No Atrophie Blanche: No Crepitus: No Cyanosis: No Excoriation: No Ecchymosis: No Fluctuance: No Erythema: No Friable: No Hemosiderin Staining: No Induration: No Mottled: No Localized Edema: No Pallor: No Rash: No Rubor: No Scarring: Yes Temperature / Pain Moisture Temperature: No Abnormality No Abnormalities Noted: No Tenderness on Palpation: Yes Dry / Scaly: No Maceration: No Moist: Yes Wound Preparation Ulcer Cleansing: Other: soap and water, Topical Anesthetic Applied: Other: lidocaine 4%, Treatment Notes Wound #3 (Left, Lateral Lower Leg) 1. Cleansed with: Cleanse wound with antibacterial soap and water 2. Anesthetic Topical Lidocaine 4% cream to wound bed prior to  debridement 3. Peri-wound Care: Barrier cream Other peri-wound care (specify in notes) 4. Dressing Applied: Iodosorb Ointment Iodoflex 5. Secondary Dressing Applied ABD Pad Balsam, Maddy H. ([id number]) 7. Secured with Tape Unna Boots Bilaterally Notes Nystatin, TCA Electronic Signature(s) Signed: 09/27/2015 4:57:15 PM By: Alejandro Mulling Entered By: Alejandro Mulling on 09/26/2015 08:23:40 Bernard, Elenna Rexene [name] ([id number]) -------------------------------------------------------------------------------- Wound Assessment Details Patient Name: Hallam, Ambriel H. Date of Service: 09/26/2015 8:00 AM Medical Record Patient Account Number: [id number] Number: Treating RN: Phillis Haggis 05/11/1956 (59 y.o. Other Clinician: Date of Birth/Sex: Female) Treating ROBSON, MICHAEL Primary Care Physician/Extender: Comer Locket Physician: Referring Physician: Jones Broom in Treatment: 34 Wound Status Wound Number: 8 Primary Etiology: Venous Leg Ulcer Wound Location: Right Lower Leg - Posterior Wound Status: Open Wounding Event: Gradually Appeared Comorbid History: Hypertension, Type II Diabetes Date Acquired: 03/27/2015 Weeks Of  Treatment: 25 Clustered Wound: No Photos Photo Uploaded By: Alejandro Mulling on 09/26/2015 14:12:25 Wound Measurements Length: (cm) 2.5 Width: (cm) 1 Depth: (cm) 0.1 Area: (cm) 1.963 Volume: (cm) 0.196 % Reduction in Area: -11.1% % Reduction in Volume: -10.7% Epithelialization: None Tunneling: No Undermining: No Wound Description Full Thickness Without Classification: Exposed Support Structures Diabetic Severity Grade 1 (Wagner): Wound Margin: Flat and Intact Exudate Amount: Large Exudate Type: Serous Exudate Color: amber Tormey, Evalina H. ([id number]) Foul Odor After Cleansing: No Wound Bed Granulation Amount: Large (67-100%) Exposed Structure Granulation Quality: Red, Hyper-granulation Fascia Exposed:  No Necrotic Amount: Small (1-33%) Fat Layer Exposed: No Necrotic Quality: Eschar, Adherent Slough Tendon Exposed: No Muscle Exposed: No Joint Exposed: No Bone Exposed: No Limited to Skin Breakdown Periwound Skin Texture Texture Color No Abnormalities Noted: No No Abnormalities Noted: No Callus: No Atrophie Blanche: No Crepitus: No Cyanosis: No Excoriation: No Ecchymosis: No Fluctuance: No Erythema: No Friable: No Hemosiderin Staining: No Induration: No Mottled: No Localized Edema: Yes Pallor: No Rash: No Rubor: No Scarring: No Temperature / Pain Moisture Temperature: No Abnormality No Abnormalities Noted: No Tenderness on Palpation: Yes Dry / Scaly: No Maceration: No Moist: Yes Wound Preparation Ulcer Cleansing: Other: soap and water, Topical Anesthetic Applied: Other: lidocaine 4%, Treatment Notes Wound #8 (Right, Posterior Lower Leg) 1. Cleansed with: Cleanse wound with antibacterial soap and water 2. Anesthetic Topical Lidocaine 4% cream to wound bed prior to debridement 3. Peri-wound Care: Barrier cream Other peri-wound care (specify in notes) 4. Dressing Applied: Iodosorb Ointment Iodoflex 5. Secondary Dressing Applied ABD Pad Plaisted, Roshini H. ([id number]) 7. Secured with Tape Unna Boots Bilaterally Notes Nystatin, TCA Electronic Signature(s) Signed: 09/27/2015 4:57:15 PM By: Alejandro Mulling Entered By: Alejandro Mulling on 09/26/2015 08:21:54 Villers, Kavina Rexene [name] ([id number]) -------------------------------------------------------------------------------- Vitals Details Patient Name: Swiger, Yazlyn H. Date of Service: 09/26/2015 8:00 AM Medical Record Patient Account Number: [id number] Number: Treating RN: Phillis Haggis [birth year] (59 y.o. Other Clinician: Date of Birth/Sex: Female) Treating ROBSON, MICHAEL Primary Care Physician/Extender: Comer Locket Physician: Referring Physician: Jones Broom in  Treatment: 34 Vital Signs Time Taken: 08:04 Temperature (F): 98.1 Height (in): 68 Pulse (bpm): 85 Weight (lbs): 294 Respiratory Rate (breaths/min): 20 Body Mass Index (BMI): 44.7 Blood Pressure (mmHg): 153/75 Reference Range: 80 - 120 mg / dl Electronic Signature(s) Signed: 09/27/2015 4:57:15 PM By: Alejandro Mulling Entered By: Alejandro Mulling on 09/26/2015 08:06:44

## 2015-09-28 NOTE — Progress Notes (Signed)
[name] ([id number]) Visit Report for 09/26/2015 Chief Complaint Document Details Patient Name: [name], [name]. Date of Service: 09/26/2015 8:00 AM Medical Record Patient Account Number: [id number] Number: Treating RN: Phillis Haggis 08-26-1955 (60 y.o. Other Clinician: Date of Birth/Sex: Female) Treating ROBSON, MICHAEL Primary Care Physician/Extender: Comer Locket Physician: Referring Physician: Jones Broom in Treatment: 59 Information Obtained from: Patient Chief Complaint Chronic bilateral calf ulcers. Electronic Signature(s) Signed: 09/27/2015 8:13:25 AM By: Baltazar Najjar MD Entered By: Baltazar Najjar on 09/26/2015 08:47:58 [name], [name] ([id number]) -------------------------------------------------------------------------------- Debridement Details Patient Name: [name], [name]. Date of Service: 09/26/2015 8:00 AM Medical Record Patient Account Number: [id number] Number: Treating RN: Phillis Haggis 1956-05-12 (60 y.o. Other Clinician: Date of Birth/Sex: Female) Treating ROBSON, MICHAEL Primary Care Physician/Extender: Comer Locket Physician: Referring Physician: Jones Broom in Treatment: 34 Debridement Performed for Wound #1 Right,Anterior Lower Leg Assessment: Performed By: Physician Maxwell Caul, MD Debridement: Debridement Pre-procedure Yes Verification/Time Out Taken: Start Time: 08:30 Pain Control: Other : lidocaine 4% cream Level: Skin/Subcutaneous Tissue Total Area Debrided (L x 0.1 (cm) x 0.1 (cm) = 0.01 (cm) W): Tissue and other Viable, Non-Viable, Exudate, Fibrin/Slough, Subcutaneous material debrided: Instrument: Curette Bleeding: None End Time: 08:32 Procedural Pain: 0 Post Procedural Pain: 0 Response to Treatment: Procedure was tolerated well Post Debridement Measurements of Total Wound Length: (cm) 0.1 Width: (cm) 0.1 Depth: (cm) 0.2 Volume:  (cm) 0.002 Post Procedure Diagnosis Same as Pre-procedure Electronic Signature(s) Signed: 09/27/2015 8:13:25 AM By: Baltazar Najjar MD Signed: 09/27/2015 4:57:15 PM By: Alejandro Mulling Entered By: Baltazar Najjar on 09/26/2015 08:47:19 [name], [name] ([id number]) -------------------------------------------------------------------------------- Debridement Details Patient Name: [name], [name]. Date of Service: 09/26/2015 8:00 AM Medical Record Patient Account Number: [id number] Number: Treating RN: Phillis Haggis 03/25/1956 (60 y.o. Other Clinician: Date of Birth/Sex: Female) Treating ROBSON, MICHAEL Primary Care Physician/Extender: Comer Locket Physician: Referring Physician: Jones Broom in Treatment: 34 Debridement Performed for Wound #3 Left,Lateral Lower Leg Assessment: Performed By: Physician Maxwell Caul, MD Debridement: Debridement Pre-procedure Yes Verification/Time Out Taken: Start Time: 08:32 Pain Control: Other : lidocaine 4% cream Level: Skin/Subcutaneous Tissue Total Area Debrided (L x 7 (cm) x 2 (cm) = 14 (cm) W): Tissue and other Viable, Non-Viable, Exudate, Fibrin/Slough, Subcutaneous material debrided: Instrument: Curette Bleeding: None End Time: 08:34 Procedural Pain: 0 Post Procedural Pain: 0 Response to Treatment: Procedure was tolerated well Post Debridement Measurements of Total Wound Length: (cm) 7 Width: (cm) 2 Depth: (cm) 0.1 Volume: (cm) 1.1 Post Procedure Diagnosis Same as Pre-procedure Electronic Signature(s) Signed: 09/27/2015 8:13:25 AM By: Baltazar Najjar MD Signed: 09/27/2015 4:57:15 PM By: Alejandro Mulling Entered By: Baltazar Najjar on 09/26/2015 08:47:39 [name], [name] ([id number]) -------------------------------------------------------------------------------- HPI Details Patient Name: [name], [name]. Date of Service: 09/26/2015 8:00 AM Medical Record Patient Account  Number: [id number] Number: Treating RN: Phillis Haggis July 28, 1955 (60 y.o. Other Clinician: Date of Birth/Sex: Female) Treating ROBSON, MICHAEL Primary Care Physician/Extender: Comer Locket Physician: Referring Physician: Jones Broom in Treatment: 33 History of Present Illness HPI Description: Pleasant 60 year old with [name]/o DM (Hgb A1c 7.4 in Aug 2016), ESRD (on hemodialysis since July 2016). Presented to her PCP, Dr. Venora Maples, with BLE ulcers since June 2016. 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 Left lower extremity ultrasound in May 2016 showed no evidence for DVT. No assessment  for venous insufficiency. Culture 04/19/2015 grew methicillin sensitive staph aureus (sensitive to tetracycline), Stenotrophomonas maltophilia, and Enterococcus faecalis. Completed course of doxycycline. Hospitalized in Nov 2016 for worsening cellulitis. Treated with IV antibiotics. No operative debridement or biopsy. Punch biopsy of left calf ulcer 06/28/2015 showed no evidence for malignancy. Bilateral lower extremity venous ultrasound 07/20/2015 showed no evidence for DVT or chronic venous insufficiency. Performing dressing changes with silver alginate. Tolerating Profore light bilaterally 3x/week. She returns to clinic and is without complaints. No significant pain. No fever or chills. Less drainage. 08/09/2015 -- details over the last several months noted. Continues to stay hemodialysis 3 times a week. Says she is improving slowly and the pain is much better 08/16/15; this patient has wounds on her bilateral lower extremities which is been present since June 2016. The exact etiology of this is not really clear although she was hospitalized in October for cellulitis. I'm not sure if this was felt to be bilateral. In any case she had a debridement last  week. She is using Aquacel Ag. Per the nursing staff the wounds continue to do nicely 08/22/15; patient's wounds were covered in a surface eschar with surrounding slough . All of this underwent a surgical debridement including slough over the actual wound bed removing subcutaneous tissue. The wounds on the right lateral leg and right lateral leg all have improved quite nicely. She has been using Aquacel Ag 08/29/15 patient has bilateral leg wounds which are probably largely venous insufficiency. She does not have infection. All the wounds underwent surgical debridement with a curette. This was to remove fibrinous surface slough and circumferential callus and nonviable subcutaneous tissue and skin. She tolerates this quite well. I changed her to Iodoflex last week. Dressings are [facility] 09/06/15; her wounds continued to improve both in appearance and in volume. We had reduced her from a Profore to a Profore light last week due to blistering on her skin. The blistering is better but her edema control is not so good. She still has itchy excoriated scan which makes me wonder whether she might be reacting to the surface layer of the Profore wraps. Her wounds required surgical debridement but again both of them look better. [name], Lun [name]. ([id number]) 09/12/15 continued improvement in the appearance of the wounds undergoing a surgical debridement of circumferential callus, fibrinous surface slough and nonviable subcutaneous tissue. Her skin is a lot better leading me to believe she had some form of contact dermatitis with the skin contact layer of both the Profore in Profore light wraps. 09/19/15 the patient's wounds which are predominantly on the left anterior lateral leg are improved no debridement was required. On the right anterior lateral leg she has a very small open wound with some depth and a superficial wound posterior to that. We have been using Iodoflex 09/26/15;  the patient continues to make good improvement in her wound care. Surgical debridement done to remove surface slough callus and nonviable subcutaneous tissue although this looks considerably better. 2 small open areas on the lateral aspect of the left leg have a healthy granulated base. She has a pinpoint area on the right. We have been using Iodoflex on the left and Iodosorb on the right Electronic Signature(s) Signed: 09/27/2015 8:13:25 AM By: Baltazar Najjarobson, Michael MD Entered By: Baltazar Najjarobson, Michael on 09/26/2015 08:49:00 [name], [name] EdelmanPAMELA [name]. ([id number]) -------------------------------------------------------------------------------- Physical Exam Details Patient Name: [name], [name]. Date of Service: 09/26/2015 8:00 AM Medical Record Patient Account Number: [id number] Number: Treating RN: Ashok CordiaPinkerton,  Debi [birth year] (60 y.o. Other Clinician: Date of Birth/Sex: Female) Treating ROBSON, MICHAEL Primary Care Physician/Extender: Comer Locket Physician: Referring Physician: Jones Broom in Treatment: 34 Notes Wound exam; the area on the left lateral leg and right lateral leg continue improvement. There is no evidence of infection. She had an allergic reaction to the contact layer Profore therefore we have been using Unna boots and these are being tolerated well. Peripheral pulses are palpable. Electronic Signature(s) Signed: 09/27/2015 8:13:25 AM By: Baltazar Najjar MD Entered By: Baltazar Najjar on 09/26/2015 08:50:34 [name], [name] ([id number]) -------------------------------------------------------------------------------- Physician Orders Details Patient Name: [name], [name]. Date of Service: 09/26/2015 8:00 AM Medical Record Patient Account Number: [id number] Number: Treating RN: Phillis Haggis [birth year] (59 y.o. Other Clinician: Date of Birth/Sex: Female) Treating ROBSON, MICHAEL Primary Care Physician/Extender: Comer Locket Physician: Referring Physician: Jones Broom in Treatment: 75 Verbal / Phone Orders: Yes Clinician: Ashok Cordia, Debi Read Back and Verified: Yes Diagnosis Coding Wound Cleansing Wound #1 Right,Anterior Lower Leg o Clean wound with Normal Saline. o Cleanse wound with mild soap and water o May Shower, gently pat wound dry prior to applying new dressing. Wound #3 Left,Lateral Lower Leg o Clean wound with Normal Saline. o Cleanse wound with mild soap and water o May Shower, gently pat wound dry prior to applying new dressing. Wound #8 Right,Posterior Lower Leg o Clean wound with Normal Saline. o Cleanse wound with mild soap and water o May Shower, gently pat wound dry prior to applying new dressing. Anesthetic Wound #1 Right,Anterior Lower Leg o Topical Lidocaine 4% cream applied to wound bed prior to debridement Wound #3 Left,Lateral Lower Leg o Topical Lidocaine 4% cream applied to wound bed prior to debridement Wound #8 Right,Posterior Lower Leg o Topical Lidocaine 4% cream applied to wound bed prior to debridement Skin Barriers/Peri-Wound Care Wound #1 Right,Anterior Lower Leg o Antifungal cream - please use on legs not on wounds, and TCA o Barrier cream - please use barrier cream around wounds Wound #3 Left,Lateral Lower Leg o Antifungal cream - please use on legs not on wounds, and TCA o Barrier cream [name], [name]. ([id number]) Wound #8 Right,Posterior Lower Leg o Antifungal cream - please use on legs not on wounds, and TCA o Barrier cream Primary Wound Dressing Wound #1 Right,Anterior Lower Leg o Iodosorb Ointment Wound #3 Left,Lateral Lower Leg o Iodoflex Wound #8 Right,Posterior Lower Leg o Iodoflex Dressing Change Frequency Wound #1 Right,Anterior Lower Leg o Change dressing every week Wound #3 Left,Lateral Lower Leg o Change dressing every week Wound #8 Right,Posterior Lower Leg o Change  dressing every week Follow-up Appointments Wound #1 Right,Anterior Lower Leg o Return Appointment in 1 week. o Return Appointment in 1 week. Wound #3 Left,Lateral Lower Leg o Return Appointment in 1 week. Wound #8 Right,Posterior Lower Leg o Return Appointment in 1 week. Edema Control Wound #1 Right,Anterior Lower Leg o Unna Boots Bilaterally o Elevate legs to the level of the heart and pump ankles as often as possible - Elevate legs at Dialysis Wound #3 Left,Lateral Lower Leg o Unna Boots Bilaterally o Elevate legs to the level of the heart and pump ankles as often as possible - Elevate legs at Dialysis Wound #8 Right,Posterior Lower Leg [name], [name]. ([id number]) o Unna Boots Bilaterally o Elevate legs to the level of the heart and pump ankles as often as possible - Elevate legs at Dialysis Off-Loading Wound #1 Right,Anterior Lower Leg o Turn  and reposition every 2 hours Wound #3 Left,Lateral Lower Leg o Turn and reposition every 2 hours Wound #8 Right,Posterior Lower Leg o Turn and reposition every 2 hours Additional Orders / Instructions Wound #1 Right,Anterior Lower Leg o Activity as tolerated Wound #3 Left,Lateral Lower Leg o Activity as tolerated Wound #8 Right,Posterior Lower Leg o Activity as tolerated Medications-please add to medication list. Wound #1 Right,Anterior Lower Leg o Other: - Antifungal Cream to bilateral lower legs, NOT ON WOUNDS, and TCA. Electronic Signature(s) Signed: 09/27/2015 8:13:25 AM By: Baltazar Najjar MD Signed: 09/27/2015 4:57:15 PM By: Alejandro Mulling Entered By: Alejandro Mulling on 09/26/2015 08:35:28 Stang, [name] ([id number]) -------------------------------------------------------------------------------- Problem List Details Patient Name: Galanti, Trish [name]. Date of Service: 09/26/2015 8:00 AM Medical Record Patient Account Number: [id number] Number: Treating RN: Phillis Haggis May 04, 1956 (59 y.o. 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 Date of Service: 09/26/2015 8:00 AM Medical Record Patient Account Number: [id number] Number: Treating RN: Phillis Haggis Jan 26, 1956 (59 y.o. Other Clinician: Date of Birth/Sex: Female) Treating ROBSON, MICHAEL Primary Care Physician/Extender: Comer Locket Physician: Referring Physician: Jones Broom in Treatment: 61 Subjective Chief Complaint Information obtained from Patient Chronic bilateral calf ulcers. History of Present Illness (HPI) Pleasant 60 year old with [name]/o DM (Hgb A1c 7.4 in Aug 2016), ESRD (on hemodialysis since July 2016). Presented to her PCP, Dr. Venora Maples, with BLE ulcers since June 2016. 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 Left lower extremity ultrasound in May 2016 showed no evidence for DVT. No assessment for  venous insufficiency. Culture 04/19/2015 grew methicillin sensitive staph aureus (sensitive to tetracycline), Stenotrophomonas maltophilia, and Enterococcus faecalis. Completed course of doxycycline. Hospitalized in Nov 2016 for worsening cellulitis. Treated with IV antibiotics. No operative debridement or biopsy. Punch biopsy of left calf ulcer 06/28/2015 showed no evidence for malignancy. Bilateral lower extremity venous ultrasound 07/20/2015 showed no evidence for DVT or chronic venous insufficiency. Performing dressing changes with silver alginate. Tolerating Profore light bilaterally 3x/week. She returns to clinic and is without complaints. No significant pain. No fever or chills. Less drainage. 08/09/2015 -- details over the last several months noted. Continues to stay hemodialysis 3 times a week. Says she is improving slowly and the pain is much better 08/16/15; this patient has wounds on her bilateral lower extremities which is been present since June 2016. The exact etiology of this is not really clear although she was hospitalized in October for cellulitis. I'm not sure if this was felt to be bilateral. In any case she had a debridement last week. She is using Aquacel Ag. Per the nursing staff the wounds continue to do nicely 08/22/15; patient's wounds were covered in a surface eschar with surrounding slough .  All of this underwent a surgical debridement including slough over the actual wound bed removing subcutaneous tissue. The wounds on the right lateral leg and right lateral leg all have improved quite nicely. She has been using Aquacel Ag 08/29/15 patient has bilateral leg wounds which are probably largely venous insufficiency. She does not have infection. All the wounds underwent surgical debridement with a curette. This was to remove fibrinous surface slough and circumferential callus and nonviable subcutaneous tissue and skin. She tolerates this quite well. I changed her to  Iodoflex last week. Dressings are being [facility] Lipe, Lace [name]. ([id number]) 09/06/15; her wounds continued to improve both in appearance and in volume. We had reduced her from a Profore to a Profore light last week due to blistering on her skin. The blistering is better but her edema control is not so good. She still has itchy excoriated scan which makes me wonder whether she might be reacting to the surface layer of the Profore wraps. Her wounds required surgical debridement but again both of them look better. 09/12/15 continued improvement in the appearance of the wounds undergoing a surgical debridement of circumferential callus, fibrinous surface slough and nonviable subcutaneous tissue. Her skin is a lot better leading me to believe she had some form of contact dermatitis with the skin contact layer of both the Profore in Profore light wraps. 09/19/15 the patient's wounds which are predominantly on the left anterior lateral leg are improved no debridement was required. On the right anterior lateral leg she has a very small open wound with some depth and a superficial wound posterior to that. We have been using Iodoflex 09/26/15; the patient continues to make good improvement in her wound care. Surgical debridement done to remove surface slough callus and nonviable subcutaneous tissue although this looks considerably better. 2 small open areas on the lateral aspect of the left leg have a healthy granulated base. She has a pinpoint area on the right. We have been using Iodoflex on the left and Iodosorb on the right Objective Constitutional Vitals Time Taken: 8:04 AM, Height: 68 in, Weight: 294 lbs, BMI: 44.7, Temperature: 98.1 F, Pulse: 85 bpm, Respiratory Rate: 20 breaths/min, Blood Pressure: 153/75 mmHg. Integumentary (Hair, Skin) Wound #1 status is Open. Original cause of wound was Gradually Appeared. The wound is located on the Right,Anterior Lower Leg. The wound  measures 0.1cm length x 0.1cm width x 0.1cm depth; 0.008cm^2 area and 0.001cm^3 volume. The wound is limited to skin breakdown. There is no tunneling or undermining noted. There is a large amount of serous drainage noted. The wound margin is thickened. There is no granulation within the wound bed. There is a large (67-100%) amount of necrotic tissue within the wound bed including Eschar and Adherent Slough. The periwound skin appearance exhibited: Scarring, Moist. The periwound skin appearance did not exhibit: Callus, Crepitus, Excoriation, Fluctuance, Friable, Induration, Localized Edema, Rash, Dry/Scaly, Maceration, Atrophie Blanche, Cyanosis, Ecchymosis, Hemosiderin Staining, Mottled, Pallor, Rubor, Erythema. Periwound temperature was noted as No Abnormality. The periwound has tenderness on palpation. Wound #3 status is Open. Original cause of wound was Gradually Appeared. The wound is located on the Left,Lateral Lower Leg. The wound measures 7cm length x 2cm width x 0.1cm depth; 10.996cm^2 area and 1.1cm^3 volume. The wound is limited to skin breakdown. There is no tunneling or undermining noted. There is a large amount of serosanguineous drainage noted. The wound margin is flat and intact. There is large (67-100%) red granulation within the wound bed. There  is a small (1-33%) amount of necrotic tissue within the wound bed including Adherent Slough. The periwound skin appearance exhibited: Scarring, Moist. The periwound skin appearance did not exhibit: Callus, Crepitus, Excoriation, Fluctuance, Friable, Induration, Localized Edema, Rash, Dry/Scaly, Maceration, Atrophie Blanche, Cyanosis, Ecchymosis, Hemosiderin Staining, Mottled, Pallor, Rubor, Erythema. Periwound temperature was noted as No Carachure, Traeh [name]. ([id number]) Abnormality. The periwound has tenderness on palpation. Wound #8 status is Open. Original cause of wound was Gradually Appeared. The wound is located on  the Right,Posterior Lower Leg. The wound measures 2.5cm length x 1cm width x 0.1cm depth; 1.963cm^2 area and 0.196cm^3 volume. The wound is limited to skin breakdown. There is no tunneling or undermining noted. There is a large amount of serous drainage noted. The wound margin is flat and intact. There is large (67-100%) red granulation within the wound bed. There is a small (1-33%) amount of necrotic tissue within the wound bed including Eschar and Adherent Slough. The periwound skin appearance exhibited: Localized Edema, Moist. The periwound skin appearance did not exhibit: Callus, Crepitus, Excoriation, Fluctuance, Friable, Induration, Rash, Scarring, Dry/Scaly, Maceration, Atrophie Blanche, Cyanosis, Ecchymosis, Hemosiderin Staining, Mottled, Pallor, Rubor, Erythema. Periwound temperature was noted as No Abnormality. The periwound has tenderness on palpation. 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 There was a Skin/Subcutaneous Tissue Debridement ([id number]) debridement with total area of 0.01 sq cm performed by Maxwell Caul, MD. with the following instrument(s): Curette to remove Viable and Non-Viable tissue/material including Exudate, Fibrin/Slough, and Subcutaneous after achieving pain control using Other (lidocaine 4% cream). A time out was conducted prior to the start of the procedure. There was no bleeding. The procedure was tolerated well with a pain level of 0 throughout and a pain level of 0 following the procedure. Post Debridement Measurements: 0.1cm length x 0.1cm width x 0.2cm  depth; 0.002cm^3 volume. Post procedure Diagnosis Wound #1: Same as Pre-Procedure Wound #3 Wound #3 is a Venous Leg Ulcer located on the Left,Lateral Lower Leg . There was a Skin/Subcutaneous Tissue Debridement ([id number]) debridement with total area of 14 sq cm performed by Harley Hallmark, Leslieanne [name]. ([id number]) Venetia Night, MD. with the following instrument(s): Curette to remove Viable and Non-Viable tissue/material including Exudate, Fibrin/Slough, and Subcutaneous after achieving pain control using Other (lidocaine 4% cream). A time out was conducted prior to the start of the procedure. There was no bleeding. The procedure was tolerated well with a pain level of 0 throughout and a pain level of 0 following the procedure. Post Debridement Measurements: 7cm length x 2cm width x 0.1cm depth; 1.1cm^3 volume. Post procedure Diagnosis Wound #3: Same as Pre-Procedure Plan Wound Cleansing: Wound #1 Right,Anterior Lower Leg: Clean wound with Normal Saline. Cleanse wound with mild soap and water May Shower, gently pat wound dry prior to applying new dressing. Wound #3 Left,Lateral Lower Leg: Clean wound with Normal Saline. Cleanse wound with mild soap and water May Shower, gently pat wound dry prior to applying new dressing. Wound #8 Right,Posterior Lower Leg: Clean wound with Normal Saline. Cleanse wound with mild soap and water May Shower, gently pat wound dry prior to applying new dressing. Anesthetic: Wound #  1 Right,Anterior Lower Leg: Topical Lidocaine 4% cream applied to wound bed prior to debridement Wound #3 Left,Lateral Lower Leg: Topical Lidocaine 4% cream applied to wound bed prior to debridement Wound #8 Right,Posterior Lower Leg: Topical Lidocaine 4% cream applied to wound bed prior to debridement Skin Barriers/Peri-Wound Care: Wound #1 Right,Anterior Lower Leg: Antifungal cream - please use on legs not on wounds, and TCA Barrier cream - please use barrier cream around  wounds Wound #3 Left,Lateral Lower Leg: Antifungal cream - please use on legs not on wounds, and TCA Barrier cream Wound #8 Right,Posterior Lower Leg: Antifungal cream - please use on legs not on wounds, and TCA Barrier cream Primary Wound Dressing: Wound #1 Right,Anterior Lower Leg: Iodosorb Ointment Wound #3 Left,Lateral Lower Leg: Iodoflex Wound #8 Right,Posterior Lower Leg: Markwardt, Latosha [name]. ([id number]) Iodoflex Dressing Change Frequency: Wound #1 Right,Anterior Lower Leg: Change dressing every week Wound #3 Left,Lateral Lower Leg: Change dressing every week Wound #8 Right,Posterior Lower Leg: Change dressing every week Follow-up Appointments: Wound #1 Right,Anterior Lower Leg: Return Appointment in 1 week. Return Appointment in 1 week. Wound #3 Left,Lateral Lower Leg: Return Appointment in 1 week. Wound #8 Right,Posterior Lower Leg: Return Appointment in 1 week. Edema Control: Wound #1 Right,Anterior Lower Leg: Unna Boots Bilaterally Elevate legs to the level of the heart and pump ankles as often as possible - Elevate legs at Dialysis Wound #3 Left,Lateral Lower Leg: Unna Boots Bilaterally Elevate legs to the level of the heart and pump ankles as often as possible - Elevate legs at Dialysis Wound #8 Right,Posterior Lower Leg: Unna Boots Bilaterally Elevate legs to the level of the heart and pump ankles as often as possible - Elevate legs at Dialysis Off-Loading: Wound #1 Right,Anterior Lower Leg: Turn and reposition every 2 hours Wound #3 Left,Lateral Lower Leg: Turn and reposition every 2 hours Wound #8 Right,Posterior Lower Leg: Turn and reposition every 2 hours Additional Orders / Instructions: Wound #1 Right,Anterior Lower Leg: Activity as tolerated Wound #3 Left,Lateral Lower Leg: Activity as tolerated Wound #8 Right,Posterior Lower Leg: Activity as tolerated Medications-please add to medication list.: Wound #1 Right,Anterior Lower Leg: Other: -  Antifungal Cream to bilateral lower legs, NOT ON WOUNDS, and TCA. Continue Iodoflex on the left x2 and iodosorb on the right Moehring, Shalece [name]. ([id number]) Roland Rack Electronic Signature(s) Signed: 09/27/2015 8:13:25 AM By: Baltazar Najjar MD Entered By: Baltazar Najjar on 09/26/2015 08:51:27 Dibuono, [name] ([id number]) -------------------------------------------------------------------------------- SuperBill Details Patient Name: Munger, Vita [name]. Date of Service: 09/26/2015 Medical Record Patient Account Number: [id number] Number: Treating RN: Phillis Haggis Jul 25, 1955 (59 y.o. 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 R60.0 Localized edema I89.0 Lymphedema, not elsewhere classified Facility Procedures CPT4 Code: [id number] Description: 11042 - DEB SUBQ TISSUE 20 SQ CM/< ICD-10 Description Diagnosis L97.222 Non-pressure chronic ulcer of left calf with fat l Modifier: ayer exposed Quantity: 1 Physician Procedures CPT4 Code: [id number] Description: 11042 - WC PHYS SUBQ TISS 20 SQ CM ICD-10 Description Diagnosis L97.222 Non-pressure chronic ulcer of left calf with fat l Modifier: ayer exposed Quantity: 1 Electronic Signature(s) Signed: 09/27/2015 8:13:25 AM By: Baltazar Najjar MD Entered By: Baltazar Najjar on  09/26/2015 08:51:55 

## 2015-10-03 ENCOUNTER — Encounter: Payer: BC Managed Care – PPO | Admitting: Internal Medicine

## 2015-10-03 DIAGNOSIS — I87313 Chronic venous hypertension (idiopathic) with ulcer of bilateral lower extremity: Secondary | ICD-10-CM | POA: Diagnosis not present

## 2015-10-03 DIAGNOSIS — E11622 Type 2 diabetes mellitus with other skin ulcer: Secondary | ICD-10-CM | POA: Diagnosis not present

## 2015-10-03 DIAGNOSIS — L97821 Non-pressure chronic ulcer of other part of left lower leg limited to breakdown of skin: Secondary | ICD-10-CM | POA: Diagnosis not present

## 2015-10-03 DIAGNOSIS — L97811 Non-pressure chronic ulcer of other part of right lower leg limited to breakdown of skin: Secondary | ICD-10-CM | POA: Diagnosis not present

## 2015-10-06 NOTE — Progress Notes (Signed)
[name], [name] ([id number]) Visit Report for 10/03/2015 Chief Complaint Document Details Patient Name: [name], [name]. Date of Service: 10/03/2015 8:00 AM Medical Record Patient Account Number: [id number] Number: Treating RN: Phillis Haggis Mar 03, 1956 (59 y.o. Other Clinician: Date of Birth/Sex: Female) Treating ROBSON, MICHAEL Primary Care Physician/Extender: Comer Locket Physician: Referring Physician: Jones Broom in Treatment: 35 Information Obtained from: Patient Chief Complaint Chronic bilateral calf ulcers. Electronic Signature(s) Signed: 10/04/2015 4:33:06 PM By: Baltazar Najjar MD Entered By: Baltazar Najjar on 10/03/2015 08:55:29 [name] ([id number]) -------------------------------------------------------------------------------- Debridement Details Patient Name: [name], [name] Date of Service: 10/03/2015 8:00 AM Medical Record Patient Account Number: [id number] Number: Treating RN: Phillis Haggis Oct 20, 1955 (59 y.o. Other Clinician: Date of Birth/Sex: Female) Treating ROBSON, MICHAEL Primary Care Physician/Extender: Comer Locket Physician: Referring Physician: Jones Broom in Treatment: 35 Debridement Performed for Wound #3 Left,Lateral Lower Leg Assessment: Performed By: Physician Maxwell Caul, MD Debridement: Debridement Pre-procedure Yes Verification/Time Out Taken: Start Time: 08:46 Pain Control: Other : lidocaine 4% cream Level: Skin/Subcutaneous Tissue Total Area Debrided (L x 0.5 (cm) x 0.5 (cm) = 0.25 (cm) W): Tissue and other Viable, Non-Viable, Exudate, Fibrin/Slough, Subcutaneous material debrided: Instrument: Curette Bleeding: Minimum Hemostasis Achieved: Pressure End Time: 08:48 Procedural Pain: 0 Post Procedural Pain: 0 Response to Treatment: Procedure was tolerated well Post Debridement Measurements of Total Wound Length: (cm) 0.5 Width: (cm)  0.5 Depth: (cm) 0.1 Volume: (cm) 0.02 Post Procedure Diagnosis Same as Pre-procedure Electronic Signature(s) Signed: 10/03/2015 4:57:17 PM By: Alejandro Mulling Signed: 10/04/2015 4:33:06 PM By: Baltazar Najjar MD Entered By: Baltazar Najjar on 10/03/2015 08:54:50 [name] ([id number]) Hargens, Sherel H. ([id number]) -------------------------------------------------------------------------------- Debridement Details Patient Name: [name], [name] Date of Service: 10/03/2015 8:00 AM Medical Record Patient Account Number: [id number] Number: Treating RN: Phillis Haggis 1955-08-10 (59 y.o. Other Clinician: Date of Birth/Sex: Female) Treating ROBSON, MICHAEL Primary Care Physician/Extender: Comer Locket Physician: Referring Physician: Jones Broom in Treatment: 35 Debridement Performed for Wound #8 Right,Posterior Lower Leg Assessment: Performed By: Physician Maxwell Caul, MD Debridement: Debridement Pre-procedure Yes Verification/Time Out Taken: Start Time: 08:43 Pain Control: Other : lidocaine 4% cream Level: Skin/Subcutaneous Tissue Total Area Debrided (L x 2.5 (cm) x 1 (cm) = 2.5 (cm) W): Tissue and other Viable, Non-Viable, Exudate, Fibrin/Slough, Subcutaneous material debrided: Instrument: Curette Bleeding: Minimum Hemostasis Achieved: Pressure End Time: 08:46 Procedural Pain: 0 Post Procedural Pain: 0 Response to Treatment: Procedure was tolerated well Post Debridement Measurements of Total Wound Length: (cm) 2.5 Width: (cm) 1 Depth: (cm) 0.1 Volume: (cm) 0.196 Post Procedure Diagnosis Same as Pre-procedure Electronic Signature(s) Signed: 10/03/2015 4:57:17 PM By: Alejandro Mulling Signed: 10/04/2015 4:33:06 PM By: Baltazar Najjar MD Entered By: Baltazar Najjar on 10/03/2015 08:55:05 [name] ([id number]) Haigh, Ruhi H.  ([id number]) -------------------------------------------------------------------------------- HPI Details Patient Name: [name], [name] Date of Service: 10/03/2015 8:00 AM Medical Record Patient Account Number: [id number] Number: Treating RN: Phillis Haggis April 06, 1956 (59 y.o. Other Clinician: Date of Birth/Sex: Female) Treating ROBSON, MICHAEL Primary Care Physician/Extender: Comer Locket Physician: Referring Physician: Jones Broom in Treatment: 35 History of Present Illness HPI Description: Pleasant 60 year old with h/o DM (Hgb A1c 7.4 in Aug 2016), ESRD (on hemodialysis since July 2016). Presented to her PCP, Dr. Venora Maples, with BLE ulcers since June 2016. 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  Left lower extremity ultrasound in May 2016 showed no evidence for DVT. No assessment for venous insufficiency. Culture 04/19/2015 grew methicillin sensitive staph aureus (sensitive to tetracycline), Stenotrophomonas maltophilia, and Enterococcus faecalis. Completed course of doxycycline. Hospitalized in Nov 2016 for worsening cellulitis. Treated with IV antibiotics. No operative debridement or biopsy. Punch biopsy of left calf ulcer 06/28/2015 showed no evidence for malignancy. Bilateral lower extremity venous ultrasound 07/20/2015 showed no evidence for DVT or chronic venous insufficiency. Performing dressing changes with silver alginate. Tolerating Profore light bilaterally 3x/week. She returns to clinic and is without complaints. No significant pain. No fever or chills. Less drainage. 08/09/2015 -- details over the last several months noted. Continues to stay hemodialysis 3 times a week. Says she is improving slowly and the pain is much better 08/16/15; this patient has wounds on her bilateral lower extremities which is been present  since June 2016. The exact etiology of this is not really clear although she was hospitalized in October for cellulitis. I'm not sure if this was felt to be bilateral. In any case she had a debridement last week. She is using Aquacel Ag. Per the nursing staff the wounds continue to do nicely 08/22/15; patient's wounds were covered in a surface eschar with surrounding slough . All of this underwent a surgical debridement including slough over the actual wound bed removing subcutaneous tissue. The wounds on the right lateral leg and right lateral leg all have improved quite nicely. She has been using Aquacel Ag 08/29/15 patient has bilateral leg wounds which are probably largely venous insufficiency. She does not have infection. All the wounds underwent surgical debridement with a curette. This was to remove fibrinous surface slough and circumferential callus and nonviable subcutaneous tissue and skin. She tolerates this quite well. I changed her to Iodoflex last week. Dressings are being changed [facility] 09/06/15; her wounds continued to improve both in appearance and in volume. We had reduced her from a Profore to a Profore light last week due to blistering on her skin. The blistering is better but her edema control is not so good. She still has itchy excoriated scan which makes me wonder whether she might be reacting to the surface layer of the Profore wraps. Her wounds required surgical debridement but again both of them look better. [name], [name] ([id number]) 09/12/15 continued improvement in the appearance of the wounds undergoing a surgical debridement of circumferential callus, fibrinous surface slough and nonviable subcutaneous tissue. Her skin is a lot better leading me to believe she had some form of contact dermatitis with the skin contact layer of both the Profore in Profore light wraps. 09/19/15 the patient's wounds which are predominantly on the left anterior lateral leg  are improved no debridement was required. On the right anterior lateral leg she has a very small open wound with some depth and a superficial wound posterior to that. We have been using Iodoflex 09/26/15; the patient continues to make good improvement in her wound care. Surgical debridement done to remove surface slough callus and nonviable subcutaneous tissue although this looks considerably better. 2 small open areas on the lateral aspect of the left leg have a healthy granulated base. She has a pinpoint area on the right. We have been using Iodoflex on the left and Iodosorb on the right 10/03/15 I filled out a pinpoint wound on the left leg. One of the areas on the right leg is healed as well. The other area on the right leg is small but  very dry. Surprisingly the superficial area on the left leg posterior to the pinpoint open area is the one area that has not progressed towards healing Electronic Signature(s) Signed: 10/04/2015 4:33:06 PM By: Baltazar Najjar MD Entered By: Baltazar Najjar on 10/03/2015 08:57:16 Irigoyen, Aaron [name] ([id number]) -------------------------------------------------------------------------------- Physical Exam Details Patient Name: [name], [name] Date of Service: 10/03/2015 8:00 AM Medical Record Patient Account Number: [id number] Number: Treating RN: Phillis Haggis 05-Oct-1955 (59 y.o. Other Clinician: Date of Birth/Sex: Female) Treating ROBSON, MICHAEL Primary Care Physician/Extender: Comer Locket Physician: Referring Physician: Jones Broom in Treatment: 35 Notes Wound exam; peripheral pulses are intact there is no evidence of surrounding infection. The 2 open areas on her left leg on the lateral aspect are carefully examined. The area most distal is healed. The proximal areas just about closed over. On the right leg pinpoint open area I think is closed as well. The very superficial area posteriorly to the pinpoint area  which I thought would be the easiest petechial here is still open. This had adherent Iodoflex on it which I removed with a curet. The base of the wound was debridement of fibrinous surface eschar. Electronic Signature(s) Signed: 10/04/2015 4:33:06 PM By: Baltazar Najjar MD Entered By: Baltazar Najjar on 10/03/2015 08:59:32 Trafton, Aaron [name] ([id number]) -------------------------------------------------------------------------------- Physician Orders Details Patient Name: [name], [name] Date of Service: 10/03/2015 8:00 AM Medical Record Patient Account Number: [id number] Number: Treating RN: Phillis Haggis 12-09-1955 (59 y.o. Other Clinician: Date of Birth/Sex: Female) Treating ROBSON, MICHAEL Primary Care Physician/Extender: Comer Locket Physician: Referring Physician: Jones Broom in Treatment: 70 Verbal / Phone Orders: Yes Clinician: Pinkerton, Debi Read Back and Verified: Yes Diagnosis Coding Wound Cleansing Wound #3 Left,Lateral Lower Leg o Clean wound with Normal Saline. o Cleanse wound with mild soap and water o May Shower, gently pat wound dry prior to applying new dressing. Wound #8 Right,Posterior Lower Leg o Clean wound with Normal Saline. o Cleanse wound with mild soap and water o May Shower, gently pat wound dry prior to applying new dressing. Anesthetic Wound #3 Left,Lateral Lower Leg o Topical Lidocaine 4% cream applied to wound bed prior to debridement Wound #8 Right,Posterior Lower Leg o Topical Lidocaine 4% cream applied to wound bed prior to debridement Skin Barriers/Peri-Wound Care Wound #3 Left,Lateral Lower Leg o Barrier cream o Moisturizing lotion Wound #8 Right,Posterior Lower Leg o Moisturizing lotion Primary Wound Dressing Wound #3 Left,Lateral Lower Leg o Prisma Ag Wound #8 Right,Posterior Lower Leg o Prisma Ag [name], [name] ([id number]) Secondary Dressing Wound #3  Left,Lateral Lower Leg o Dry Gauze Wound #8 Right,Posterior Lower Leg o Dry Gauze Dressing Change Frequency Wound #3 Left,Lateral Lower Leg o Change dressing every week Wound #8 Right,Posterior Lower Leg o Change dressing every week Follow-up Appointments Wound #3 Left,Lateral Lower Leg o Return Appointment in 1 week. Wound #8 Right,Posterior Lower Leg o Return Appointment in 1 week. Edema Control Wound #3 Left,Lateral Lower Leg o Unna Boots Bilaterally o Elevate legs to the level of the heart and pump ankles as often as possible - Elevate legs at Dialysis Wound #8 Right,Posterior Lower Leg o Unna Boots Bilaterally o Elevate legs to the level of the heart and pump ankles as often as possible - Elevate legs at Dialysis Off-Loading Wound #3 Left,Lateral Lower Leg o Turn and reposition every 2 hours Wound #8 Right,Posterior Lower Leg o Turn and reposition every 2 hours Additional Orders / Instructions Wound #3 Left,Lateral Lower  Leg o Activity as tolerated Wound #8 Right,Posterior Lower Leg o Activity as tolerated [name], [name] ([id number]) Electronic Signature(s) Signed: 10/03/2015 4:57:17 PM By: Alejandro Mulling Signed: 10/04/2015 4:33:06 PM By: Baltazar Najjar MD Entered By: Alejandro Mulling on 10/03/2015 08:53:24 [name], [name] ([id number]) -------------------------------------------------------------------------------- Problem List Details Patient Name: Hulbert, Marrianne H. Date of Service: 10/03/2015 8:00 AM Medical Record Patient Account Number: [id number] Number: Treating RN: Phillis Haggis 1955/12/08 (59 y.o. Other Clinician: Date of Birth/Sex: Female) Treating ROBSON, MICHAEL Primary Care Physician/Extender: Comer Locket Physician: Referring Physician: Fidel Levy Weeks in Treatment: 76 Active Problems ICD-10 Encounter Code Description Active Date Diagnosis E11.622 Type 2 diabetes mellitus with  other skin ulcer 01/30/2015 Yes N18.6 End stage renal disease 01/30/2015 Yes L97.222 Non-pressure chronic ulcer of left calf with fat layer 01/30/2015 Yes exposed L97.212 Non-pressure chronic ulcer of right calf with fat layer 01/30/2015 Yes exposed R60.0 Localized edema 04/19/2015 Yes I89.0 Lymphedema, not elsewhere classified 07/12/2015 Yes Inactive Problems Resolved Problems Electronic Signature(s) Signed: 10/04/2015 4:33:06 PM By: Baltazar Najjar MD Entered By: Baltazar Najjar on 10/03/2015 08:54:12 Vanacker, Aaron [name] ([id number]) -------------------------------------------------------------------------------- Progress Note Details Patient Name: [name], [name] Date of Service: 10/03/2015 8:00 AM Medical Record Patient Account Number: [id number] Number: Treating RN: Phillis Haggis 03-Oct-1955 (59 y.o. Other Clinician: Date of Birth/Sex: Female) Treating ROBSON, MICHAEL Primary Care Physician/Extender: Comer Locket Physician: Referring Physician: Jones Broom in Treatment: 35 Subjective Chief Complaint Information obtained from Patient Chronic bilateral calf ulcers. History of Present Illness (HPI) Pleasant 60 year old with h/o DM (Hgb A1c 7.4 in Aug 2016), ESRD (on hemodialysis since July 2016). Presented to her PCP, Dr. Venora Maples, with BLE ulcers since June 2016. 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 Left lower extremity ultrasound in May 2016 showed no evidence for DVT. No assessment for venous insufficiency. Culture 04/19/2015 grew methicillin sensitive staph aureus (sensitive to tetracycline), Stenotrophomonas maltophilia, and Enterococcus faecalis. Completed course of doxycycline. Hospitalized in Nov 2016 for worsening cellulitis. Treated with IV antibiotics. No operative debridement or biopsy. Punch biopsy of left  calf ulcer 06/28/2015 showed no evidence for malignancy. Bilateral lower extremity venous ultrasound 07/20/2015 showed no evidence for DVT or chronic venous insufficiency. Performing dressing changes with silver alginate. Tolerating Profore light bilaterally 3x/week. She returns to clinic and is without complaints. No significant pain. No fever or chills. Less drainage. 08/09/2015 -- details over the last several months noted. Continues to stay hemodialysis 3 times a week. Says she is improving slowly and the pain is much better 08/16/15; this patient has wounds on her bilateral lower extremities which is been present since June 2016. The exact etiology of this is not really clear although she was hospitalized in October for cellulitis. I'm not sure if this was felt to be bilateral. In any case she had a debridement last week. She is using Aquacel Ag. Per the nursing staff the wounds continue to do nicely 08/22/15; patient's wounds were covered in a surface eschar with surrounding slough . All of this underwent a surgical debridement including slough over the actual wound bed removing subcutaneous tissue. The wounds on the right lateral leg and right lateral leg all have improved quite nicely. She has been using Aquacel Ag 08/29/15 patient has bilateral leg wounds which are probably largely venous insufficiency. She does not have infection. All the wounds underwent surgical debridement with a curette. This  was to remove fibrinous surface slough and circumferential callus and nonviable subcutaneous tissue and skin. She tolerates this quite well. I changed her to Iodoflex last week. Dressings are being changed by [facility] Ice, Joyous H. ([id number]) 09/06/15; her wounds continued to improve both in appearance and in volume. We had reduced her from a Profore to a Profore light last week due to blistering on her skin. The blistering is better but her edema control is not so good. She still  has itchy excoriated scan which makes me wonder whether she might be reacting to the surface layer of the Profore wraps. Her wounds required surgical debridement but again both of them look better. 09/12/15 continued improvement in the appearance of the wounds undergoing a surgical debridement of circumferential callus, fibrinous surface slough and nonviable subcutaneous tissue. Her skin is a lot better leading me to believe she had some form of contact dermatitis with the skin contact layer of both the Profore in Profore light wraps. 09/19/15 the patient's wounds which are predominantly on the left anterior lateral leg are improved no debridement was required. On the right anterior lateral leg she has a very small open wound with some depth and a superficial wound posterior to that. We have been using Iodoflex 09/26/15; the patient continues to make good improvement in her wound care. Surgical debridement done to remove surface slough callus and nonviable subcutaneous tissue although this looks considerably better. 2 small open areas on the lateral aspect of the left leg have a healthy granulated base. She has a pinpoint area on the right. We have been using Iodoflex on the left and Iodosorb on the right 10/03/15 I filled out a pinpoint wound on the left leg. One of the areas on the right leg is healed as well. The other area on the right leg is small but very dry. Surprisingly the superficial area on the left leg posterior to the pinpoint open area is the one area that has not progressed towards healing Objective Constitutional Vitals Time Taken: 8:07 AM, Height: 68 in, Weight: 294 lbs, BMI: 44.7, Temperature: 98.0 F, Pulse: 82 bpm, Respiratory Rate: 20 breaths/min, Blood Pressure: 166/96 mmHg. Integumentary (Hair, Skin) Wound #1 status is Healed - Epithelialized. Original cause of wound was Gradually Appeared. The wound is located on the Right,Anterior Lower Leg. The wound measures 0cm length  x 0cm width x 0cm depth; 0cm^2 area and 0cm^3 volume. The wound is limited to skin breakdown. There is no tunneling or undermining noted. There is a none present amount of drainage noted. The wound margin is thickened. There is large (67-100%) pink granulation within the wound bed. There is no necrotic tissue within the wound bed. The periwound skin appearance exhibited: Scarring. The periwound skin appearance did not exhibit: Callus, Crepitus, Excoriation, Fluctuance, Friable, Induration, Localized Edema, Rash, Dry/Scaly, Maceration, Moist, Atrophie Blanche, Cyanosis, Ecchymosis, Hemosiderin Staining, Mottled, Pallor, Rubor, Erythema. Periwound temperature was noted as No Abnormality. The periwound has tenderness on palpation. Wound #3 status is Open. Original cause of wound was Gradually Appeared. The wound is located on the Left,Lateral Lower Leg. The wound measures 0.5cm length x 0.5cm width x 0.1cm depth; 0.196cm^2 area and 0.02cm^3 volume. The wound is limited to skin breakdown. There is a large amount of serosanguineous drainage noted. The wound margin is flat and intact. There is small (1-33%) red granulation within the wound bed. There is a large (67-100%) amount of necrotic tissue within the wound bed including Eschar and Adherent Slough. The periwound  skin appearance exhibited: Scarring, Moist. The [name], [name] ([id number]) periwound skin appearance did not exhibit: Callus, Crepitus, Excoriation, Fluctuance, Friable, Induration, Localized Edema, Rash, Dry/Scaly, Maceration, Atrophie Blanche, Cyanosis, Ecchymosis, Hemosiderin Staining, Mottled, Pallor, Rubor, Erythema. Periwound temperature was noted as No Abnormality. The periwound has tenderness on palpation. Wound #8 status is Open. Original cause of wound was Gradually Appeared. The wound is located on the Right,Posterior Lower Leg. The wound measures 2.5cm length x 1cm width x 0.1cm depth; 1.963cm^2 area and 0.196cm^3  volume. The wound is limited to skin breakdown. There is no tunneling or undermining noted. There is a large amount of serosanguineous drainage noted. The wound margin is flat and intact. There is large (67-100%) red granulation within the wound bed. There is a small (1-33%) amount of necrotic tissue within the wound bed including Eschar and Adherent Slough. The periwound skin appearance exhibited: Localized Edema, Moist. The periwound skin appearance did not exhibit: Callus, Crepitus, Excoriation, Fluctuance, Friable, Induration, Rash, Scarring, Dry/Scaly, Maceration, Atrophie Blanche, Cyanosis, Ecchymosis, Hemosiderin Staining, Mottled, Pallor, Rubor, Erythema. Periwound temperature was noted as No Abnormality. The periwound has tenderness on palpation. Assessment Active Problems ICD-10 E11.622 - Type 2 diabetes mellitus with other skin ulcer N18.6 - End stage renal disease L97.222 - Non-pressure chronic ulcer of left calf with fat layer exposed L97.212 - Non-pressure chronic ulcer of right calf with fat layer exposed R60.0 - Localized edema I89.0 - Lymphedema, not elsewhere classified Procedures Wound #3 Wound #3 is a Venous Leg Ulcer located on the Left,Lateral Lower Leg . There was a Skin/Subcutaneous Tissue Debridement ([id number]) debridement with total area of 0.25 sq cm performed by Maxwell Caul, MD. with the following instrument(s): Curette to remove Viable and Non-Viable tissue/material including Exudate, Fibrin/Slough, and Subcutaneous after achieving pain control using Other (lidocaine 4% cream). A time out was conducted prior to the start of the procedure. A Minimum amount of bleeding was controlled with Pressure. The procedure was tolerated well with a pain level of 0 throughout and a pain level of 0 following the procedure. Post Debridement Measurements: 0.5cm length x 0.5cm width x 0.1cm depth; 0.02cm^3 volume. Post procedure Diagnosis Wound #3: Same as  Pre-Procedure Paulick, [name] H. ([id number]) Wound #8 Wound #8 is a Venous Leg Ulcer located on the Right,Posterior Lower Leg . There was a Skin/Subcutaneous Tissue Debridement ([id number]) debridement with total area of 2.5 sq cm performed by Maxwell Caul, MD. with the following instrument(s): Curette to remove Viable and Non-Viable tissue/material including Exudate, Fibrin/Slough, and Subcutaneous after achieving pain control using Other (lidocaine 4% cream). A time out was conducted prior to the start of the procedure. A Minimum amount of bleeding was controlled with Pressure. The procedure was tolerated well with a pain level of 0 throughout and a pain level of 0 following the procedure. Post Debridement Measurements: 2.5cm length x 1cm width x 0.1cm depth; 0.196cm^3 volume. Post procedure Diagnosis Wound #8: Same as Pre-Procedure Plan Wound Cleansing: Wound #3 Left,Lateral Lower Leg: Clean wound with Normal Saline. Cleanse wound with mild soap and water May Shower, gently pat wound dry prior to applying new dressing. Wound #8 Right,Posterior Lower Leg: Clean wound with Normal Saline. Cleanse wound with mild soap and water May Shower, gently pat wound dry prior to applying new dressing. Anesthetic: Wound #3 Left,Lateral Lower Leg: Topical Lidocaine 4% cream applied to wound bed prior to debridement Wound #8 Right,Posterior Lower Leg: Topical Lidocaine 4% cream applied to wound bed prior to debridement Skin Barriers/Peri-Wound Care: Wound #  3 Left,Lateral Lower Leg: Barrier cream Moisturizing lotion Wound #8 Right,Posterior Lower Leg: Moisturizing lotion Primary Wound Dressing: Wound #3 Left,Lateral Lower Leg: Prisma Ag Wound #8 Right,Posterior Lower Leg: Prisma Ag Secondary Dressing: Wound #3 Left,Lateral Lower Leg: Dry Gauze Wound #8 Right,Posterior Lower Leg: Dry Gauze Dressing Change Frequency: Wound #3 Left,Lateral Lower Leg: Rackham, Kaylia H.  ([id number]) Change dressing every week Wound #8 Right,Posterior Lower Leg: Change dressing every week Follow-up Appointments: Wound #3 Left,Lateral Lower Leg: Return Appointment in 1 week. Wound #8 Right,Posterior Lower Leg: Return Appointment in 1 week. Edema Control: Wound #3 Left,Lateral Lower Leg: Unna Boots Bilaterally Elevate legs to the level of the heart and pump ankles as often as possible - Elevate legs at Dialysis Wound #8 Right,Posterior Lower Leg: Unna Boots Bilaterally Elevate legs to the level of the heart and pump ankles as often as possible - Elevate legs at Dialysis Off-Loading: Wound #3 Left,Lateral Lower Leg: Turn and reposition every 2 hours Wound #8 Right,Posterior Lower Leg: Turn and reposition every 2 hours Additional Orders / Instructions: Wound #3 Left,Lateral Lower Leg: Activity as tolerated Wound #8 Right,Posterior Lower Leg: Activity as tolerated 2/4 open areas are healed. wound beds are dry change from iodflex or prisma with hydrogel,unna Electronic Signature(s) Signed: 10/04/2015 4:33:06 PM By: Baltazar Najjar MD Entered By: Baltazar Najjar on 10/03/2015 09:00:52 Engebretson, Aaron [name] ([id number]) -------------------------------------------------------------------------------- SuperBill Details Patient Name: Hafer, Avarey H. Date of Service: 10/03/2015 Medical Record Patient Account Number: [id number] Number: Treating RN: Phillis Haggis 21-Jan-1956 (59 y.o. 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 R60.0  Localized edema I89.0 Lymphedema, not elsewhere classified Facility Procedures CPT4 Code: 62952841 Description: 11042 - DEB SUBQ TISSUE 20 SQ CM/< ICD-10 Description Diagnosis L97.222 Non-pressure chronic ulcer of left calf with fat l L97.212 Non-pressure chronic ulcer of right calf with fat Modifier: ayer exposed layer exposed Quantity: 1 Physician Procedures CPT4 Code: 3244010 Description: 11042 - WC PHYS SUBQ TISS 20 SQ CM ICD-10 Description Diagnosis L97.222 Non-pressure chronic ulcer of left calf with fat l L97.212 Non-pressure chronic ulcer of right calf with fat Modifier: ayer exposed layer exposed Quantity: 1 Electronic Signature(s) Signed: 10/04/2015 4:33:06 PM By: Baltazar Najjar MD Entered By: Baltazar Najjar on 10/03/2015 09:01:29

## 2015-10-06 NOTE — Progress Notes (Signed)
[name], [name] ([id number]) Visit Report for 10/03/2015 Arrival Information Details Patient Name: [name], [name]. Date of Service: 10/03/2015 8:00 AM Medical Record Patient Account Number: [id number] Number: Treating RN: Phillis Haggis 1955-12-24 (60 y.o. Other Clinician: Date of Birth/Sex: Female) Treating ROBSON, MICHAEL Primary Care Physician/Extender: Comer Locket Physician: Referring Physician: Jones Broom in Treatment: 35 Visit Information History Since Last Visit All ordered tests and consults were completed: No Patient Arrived: Ambulatory Added or deleted any medications: No Arrival Time: 08:06 Any new allergies or adverse reactions: No Accompanied By: self Had a fall or experienced change in No Transfer Assistance: None activities of daily living that may affect Patient Identification Verified: Yes risk of falls: Secondary Verification Process Yes Signs or symptoms of abuse/neglect since last No Completed: visito Patient Requires Transmission- No Hospitalized since last visit: No Based Precautions: Pain Present Now: No Patient Has Alerts: Yes Patient Alerts: DMII ABI Michie Bilateral Electronic Signature(s) Signed: 10/03/2015 4:57:17 PM By: Alejandro Mulling Entered By: Alejandro Mulling on 10/03/2015 08:07:04 [name], Pritika Rexene [name] ([id number]) -------------------------------------------------------------------------------- Encounter Discharge Information Details Patient Name: [name], [name] Date of Service: 10/03/2015 8:00 AM Medical Record Patient Account Number: [id number] Number: Treating RN: Phillis Haggis January 29, 1956 (60 y.o. Other Clinician: Date of Birth/Sex: Female) Treating ROBSON, MICHAEL Primary Care Physician/Extender: Comer Locket Physician: Referring Physician: Jones Broom in Treatment: 66 Encounter Discharge Information Items Discharge Pain Level: 0 Discharge Condition:  Stable Ambulatory Status: Ambulatory Discharge Destination: Home Transportation: Private Auto Accompanied By: self Schedule Follow-up Appointment: Yes Medication Reconciliation completed and provided to Patient/Care Yes Loa Idler: Provided on Clinical Summary of Care: 10/03/2015 Form Type Recipient Paper Patient PM Electronic Signature(s) Signed: 10/03/2015 9:14:33 AM By: Gwenlyn Perking Entered By: Gwenlyn Perking on 10/03/2015 09:14:33 Feig, Anieya H. ([id number]) -------------------------------------------------------------------------------- Lower Extremity Assessment Details Patient Name: Darnell, Allizon H. Date of Service: 10/03/2015 8:00 AM Medical Record Patient Account Number: [id number] Number: Treating RN: Phillis Haggis 01/03/1956 (60 y.o. Other Clinician: Date of Birth/Sex: Female) Treating ROBSON, MICHAEL Primary Care Physician/Extender: Comer Locket Physician: Referring Physician: Jones Broom in Treatment: 35 Edema Assessment Assessed: [Left: No] [Right: No] Edema: [Left: Yes] [Right: Yes] Calf Left: Right: Point of Measurement: 34 cm From Medial Instep 41.8 cm 40.2 cm Ankle Left: Right: Point of Measurement: 10 cm From Medial Instep 27 cm 25 cm Vascular Assessment Pulses: Posterior Tibial Dorsalis Pedis Palpable: [Left:Yes] [Right:Yes] Extremity colors, hair growth, and conditions: Extremity Color: [Left:Red] [Right:Red] Temperature of Extremity: [Left:Warm] [Right:Warm] Capillary Refill: [Left:< 3 seconds] [Right:< 3 seconds] Toe Nail Assessment Left: Right: Thick: No No Discolored: No No Deformed: No No Improper Length and Hygiene: No No Electronic Signature(s) Signed: 10/03/2015 4:57:17 PM By: Alejandro Mulling Entered By: Alejandro Mulling on 10/03/2015 08:22:39 Hogue, Nylia H. ([id number]) Sickman, Jovonna H. ([id number]) -------------------------------------------------------------------------------- Multi  Wound Chart Details Patient Name: [name], [name] Date of Service: 10/03/2015 8:00 AM Medical Record Patient Account Number: [id number] Number: Treating RN: Phillis Haggis Jul 28, 1955 (60 y.o. Other Clinician: Date of Birth/Sex: Female) Treating ROBSON, MICHAEL Primary Care Physician/Extender: Comer Locket Physician: Referring Physician: Jones Broom in Treatment: 35 Vital Signs Height(in): 68 Pulse(bpm): 82 Weight(lbs): 294 Blood Pressure 166/96 (mmHg): Body Mass Index(BMI): 45 Temperature(F): 98.0 Respiratory Rate 20 (breaths/min): Photos: [1:No Photos] [3:No Photos] [8:No Photos] Wound Location: [1:Right Lower Leg - Anterior Left Lower Leg - Lateral] [8:Right Lower Leg - Posterior] Wounding Event: [1:Gradually Appeared] [3:Gradually Appeared] [8:Gradually Appeared] Primary Etiology: [  1:Venous Leg Ulcer] [3:Venous Leg Ulcer] [8:Venous Leg Ulcer] Comorbid History: [1:Hypertension, Type II Diabetes] [3:Hypertension, Type II Diabetes] [8:Hypertension, Type II Diabetes] Date Acquired: [1:01/09/2015] [3:01/09/2015] [8:03/27/2015] Weeks of Treatment: [1:35] [3:35] [8:26] Wound Status: [1:Open] [3:Open] [8:Open] Measurements L x W x D 0.1x0.1x0.1 [3:0.5x0.5x0.1] [8:2.5x1x0.1] (cm) Area (cm) : [1:0.008] [3:0.196] [8:1.963] Volume (cm) : [1:0.001] [3:0.02] [8:0.196] % Reduction in Area: [1:83.00%] [3:98.20%] [8:-11.10%] % Reduction in Volume: 80.00% [3:98.20%] [8:-10.70%] Classification: [1:Full Thickness Without Exposed Support Structures] [3:Full Thickness Without Exposed Support Structures] [8:Full Thickness Without Exposed Support Structures] HBO Classification: [1:Grade 2] [3:Grade 1] [8:Grade 1] Exudate Amount: [1:None Present] [3:Large] [8:Large] Exudate Type: [1:N/A] [3:Serosanguineous] [8:Serosanguineous] Exudate Color: [1:N/A] [3:red, brown] [8:red, brown] Wound Margin: [1:Thickened] [3:Flat and Intact] [8:Flat and Intact] Granulation  Amount: [1:Large (67-100%)] [3:Small (1-33%)] [8:Large (67-100%)] Granulation Quality: [1:Pink] [3:Red] [8:Red, Hyper-granulation] Necrotic Amount: [1:None Present (0%)] [3:Large (67-100%)] [8:Small (1-33%)] Necrotic Tissue: N/A Eschar, Adherent Slough Eschar, Adherent Slough Exposed Structures: Fascia: No Fascia: No Fascia: No Fat: No Fat: No Fat: No Tendon: No Tendon: No Tendon: No Muscle: No Muscle: No Muscle: No Joint: No Joint: No Joint: No Bone: No Bone: No Bone: No Limited to Skin Limited to Skin Limited to Skin Breakdown Breakdown Breakdown Epithelialization: None Small (1-33%) None Periwound Skin Texture: Scarring: Yes Scarring: Yes Edema: Yes Edema: No Edema: No Excoriation: No Excoriation: No Excoriation: No Induration: No Induration: No Induration: No Callus: No Callus: No Callus: No Crepitus: No Crepitus: No Crepitus: No Fluctuance: No Fluctuance: No Fluctuance: No Friable: No Friable: No Friable: No Rash: No Rash: No Rash: No Scarring: No Periwound Skin Maceration: No Moist: Yes Moist: Yes Moisture: Moist: No Maceration: No Maceration: No Dry/Scaly: No Dry/Scaly: No Dry/Scaly: No Periwound Skin Color: Atrophie Blanche: No Atrophie Blanche: No Atrophie Blanche: No Cyanosis: No Cyanosis: No Cyanosis: No Ecchymosis: No Ecchymosis: No Ecchymosis: No Erythema: No Erythema: No Erythema: No Hemosiderin Staining: No Hemosiderin Staining: No Hemosiderin Staining: No Mottled: No Mottled: No Mottled: No Pallor: No Pallor: No Pallor: No Rubor: No Rubor: No Rubor: No Temperature: No Abnormality No Abnormality No Abnormality Tenderness on Yes Yes Yes Palpation: Wound Preparation: Ulcer Cleansing: Other: Ulcer Cleansing: Other: Ulcer Cleansing: Other: soap and water soap and water soap and water Topical Anesthetic Topical Anesthetic Topical Anesthetic Applied: Other: lidocaine Applied: Other: lidocaine Applied: Other:  lidocaine 4% 4% 4% Treatment Notes Electronic Signature(s) Signed: 10/03/2015 4:57:17 PM By: Alejandro Mulling Entered By: Alejandro Mulling on 10/03/2015 08:28:30 Stockburger, [name] ([id number]) -------------------------------------------------------------------------------- Multi-Disciplinary Care Plan Details Patient Name: Topor, Flor H. Date of Service: 10/03/2015 8:00 AM Medical Record Patient Account Number: [id number] Number: Treating RN: Phillis Haggis [birth year] (59 y.o. Other Clinician: Date of Birth/Sex: Female) Treating ROBSON, MICHAEL Primary Care Physician/Extender: Comer Locket Physician: Referring Physician: Jones Broom in Treatment: 33 Active Inactive Orientation to the Wound Care Program Nursing Diagnoses: Knowledge deficit related to the wound healing center program Goals: Patient/caregiver will verbalize understanding of the Wound Healing Center Program Date Initiated: 01/30/2015 Goal Status: Active Interventions: Provide education on orientation to the wound center Notes: Pain, Acute or Chronic Nursing Diagnoses: Pain, acute or chronic: actual or potential Goals: Patient will verbalize adequate pain control and receive pain control interventions during procedures as needed Date Initiated: 01/30/2015 Goal Status: Active Patient/caregiver will verbalize adequate pain control between visits Date Initiated: 01/30/2015 Goal Status: Active Interventions: Assess comfort goal upon admission Encourage patient to take pain medications as prescribed Notes: Keilman, Beckie H. ([id number]) Venous Leg Ulcer  Nursing Diagnoses: Potential for venous Insuffiency (use before diagnosis confirmed) Goals: Non-invasive venous studies are completed as ordered Date Initiated: 01/30/2015 Goal Status: Active Interventions: Assess peripheral edema status every visit. Notes: Wound/Skin Impairment Nursing Diagnoses: Impaired tissue  integrity Goals: Ulcer/skin breakdown will have a volume reduction of 30% by week 4 Date Initiated: 01/30/2015 Goal Status: Active Ulcer/skin breakdown will have a volume reduction of 50% by week 8 Date Initiated: 01/30/2015 Goal Status: Active Ulcer/skin breakdown will have a volume reduction of 80% by week 12 Date Initiated: 01/30/2015 Goal Status: Active Ulcer/skin breakdown will heal within 14 weeks Date Initiated: 01/30/2015 Goal Status: Active Interventions: Assess ulceration(s) every visit Notes: Electronic Signature(s) Signed: 10/03/2015 4:57:17 PM By: Alejandro MullingPinkerton, Debra Entered By: Alejandro MullingPinkerton, Debra on 10/03/2015 08:28:06 Desouza, Reonna H. ([id number]) -------------------------------------------------------------------------------- Pain Assessment Details Patient Name: Sokolowski, Persis H. Date of Service: 10/03/2015 8:00 AM Medical Record Patient Account Number: [id number] Number: Treating RN: Phillis Haggisinkerton, Debi August 08, 1955 (59 y.o. Other Clinician: Date of Birth/Sex: Female) Treating ROBSON, MICHAEL Primary Care Physician/Extender: Comer LocketG HAWKINS JR, JAMES Physician: Referring Physician: Jones BroomHAWKINS JR, JAMES Weeks in Treatment: 3835 Active Problems Location of Pain Severity and Description of Pain Patient Has Paino No Site Locations Pain Management and Medication Current Pain Management: Electronic Signature(s) Signed: 10/03/2015 4:57:17 PM By: Alejandro MullingPinkerton, Debra Entered By: Alejandro MullingPinkerton, Debra on 10/03/2015 08:07:09 Antonini, Japneet Rexene EdisonH. ([id number]) -------------------------------------------------------------------------------- Patient/Caregiver Education Details Patient Name: Moist, Chasty H. Date of Service: 10/03/2015 8:00 AM Medical Record Patient Account Number: [id number] Number: Treating RN: Phillis Haggisinkerton, Debi August 08, 1955 (59 y.o. Other Clinician: Date of Birth/Gender: Female) Treating ROBSON, MICHAEL Primary Care Physician/Extender: Comer LocketG HAWKINS JR,  JAMES Physician: Weeks in Treatment: 35 Referring Physician: Fidel LevyHAWKINS JR, JAMES Education Assessment Education Provided To: Patient Education Topics Provided Wound/Skin Impairment: Handouts: Other: do not get wraps wet Methods: Demonstration, Explain/Verbal Responses: State content correctly Electronic Signature(s) Signed: 10/03/2015 4:57:17 PM By: Alejandro MullingPinkerton, Debra Entered By: Alejandro MullingPinkerton, Debra on 10/03/2015 09:12:14 Macpherson, Hollie H. ([id number]) -------------------------------------------------------------------------------- Wound Assessment Details Patient Name: Tester, Daney H. Date of Service: 10/03/2015 8:00 AM Medical Record Patient Account Number: [id number] Number: Treating RN: Phillis Haggisinkerton, Debi August 08, 1955 (59 y.o. Other Clinician: Date of Birth/Sex: Female) Treating ROBSON, MICHAEL Primary Care Physician/Extender: Comer LocketG HAWKINS JR, JAMES Physician: Referring Physician: Jones BroomHAWKINS JR, JAMES Weeks in Treatment: 35 Wound Status Wound Number: 1 Primary Etiology: Venous Leg Ulcer Wound Location: Right, Anterior Lower Leg Wound Status: Healed - Epithelialized Wounding Event: Gradually Appeared Comorbid History: Hypertension, Type II Diabetes Date Acquired: 01/09/2015 Weeks Of Treatment: 35 Clustered Wound: No Photos Photo Uploaded By: Alejandro MullingPinkerton, Debra on 10/03/2015 12:56:33 Wound Measurements Length: (cm) 0 % Reduction i Width: (cm) 0 % Reduction i Depth: (cm) 0 Epithelializa Area: (cm) 0 Tunneling: Volume: (cm) 0 Undermining: n Area: 100% n Volume: 100% tion: None No No Wound Description Full Thickness Without Classification: Exposed Support Structures Diabetic Severity Grade 2 (Wagner): Wound Margin: Thickened Exudate Amount: None Present Foul Odor After Cleansing: No Wound Bed Granulation Amount: Large (67-100%) Exposed Structure Coupland, Danella H. ([id number]) Granulation Quality: Pink Fascia Exposed: No Necrotic Amount: None Present  (0%) Fat Layer Exposed: No Tendon Exposed: No Muscle Exposed: No Joint Exposed: No Bone Exposed: No Limited to Skin Breakdown Periwound Skin Texture Texture Color No Abnormalities Noted: No No Abnormalities Noted: No Callus: No Atrophie Blanche: No Crepitus: No Cyanosis: No Excoriation: No Ecchymosis: No Fluctuance: No Erythema: No Friable: No Hemosiderin Staining: No Induration: No Mottled: No Localized Edema: No Pallor: No Rash: No Rubor: No Scarring: Yes Temperature / Pain  Moisture Temperature: No Abnormality No Abnormalities Noted: No Tenderness on Palpation: Yes Dry / Scaly: No Maceration: No Moist: No Wound Preparation Ulcer Cleansing: Other: soap and water, Topical Anesthetic Applied: Other: lidocaine 4%, Electronic Signature(s) Signed: 10/03/2015 4:57:17 PM By: Alejandro Mulling Entered By: Alejandro Mulling on 10/03/2015 08:51:44 Hutchins, Addalynn H. ([id number]) -------------------------------------------------------------------------------- Wound Assessment Details Patient Name: Mounts, Jaylan H. Date of Service: 10/03/2015 8:00 AM Medical Record Patient Account Number: [id number] Number: Treating RN: Phillis Haggis 1956-05-26 (59 y.o. Other Clinician: Date of Birth/Sex: Female) Treating ROBSON, MICHAEL Primary Care Physician/Extender: Comer Locket Physician: Referring Physician: Jones Broom in Treatment: 35 Wound Status Wound Number: 3 Primary Etiology: Venous Leg Ulcer Wound Location: Left Lower Leg - Lateral Wound Status: Open Wounding Event: Gradually Appeared Comorbid History: Hypertension, Type II Diabetes Date Acquired: 01/09/2015 Weeks Of Treatment: 35 Clustered Wound: No Photos Photo Uploaded By: Alejandro Mulling on 10/03/2015 12:56:47 Wound Measurements Length: (cm) 0.5 Width: (cm) 0.5 Depth: (cm) 0.1 Area: (cm) 0.196 Volume: (cm) 0.02 % Reduction in Area: 98.2% % Reduction in Volume:  98.2% Epithelialization: Small (1-33%) Wound Description Full Thickness Without Exposed Foul Odor Classification: Support Structures Diabetic Severity Grade 1 (Wagner): Wound Margin: Flat and Intact Exudate Amount: Large Exudate Type: Serosanguineous Exudate Color: red, brown Piercefield, Deborha H. ([id number]) After Cleansing: No Wound Bed Granulation Amount: Small (1-33%) Exposed Structure Granulation Quality: Red Fascia Exposed: No Necrotic Amount: Large (67-100%) Fat Layer Exposed: No Necrotic Quality: Eschar, Adherent Slough Tendon Exposed: No Muscle Exposed: No Joint Exposed: No Bone Exposed: No Limited to Skin Breakdown Periwound Skin Texture Texture Color No Abnormalities Noted: No No Abnormalities Noted: No Callus: No Atrophie Blanche: No Crepitus: No Cyanosis: No Excoriation: No Ecchymosis: No Fluctuance: No Erythema: No Friable: No Hemosiderin Staining: No Induration: No Mottled: No Localized Edema: No Pallor: No Rash: No Rubor: No Scarring: Yes Temperature / Pain Moisture Temperature: No Abnormality No Abnormalities Noted: No Tenderness on Palpation: Yes Dry / Scaly: No Maceration: No Moist: Yes Wound Preparation Ulcer Cleansing: Other: soap and water, Topical Anesthetic Applied: Other: lidocaine 4%, Treatment Notes Wound #3 (Left, Lateral Lower Leg) 1. Cleansed with: Cleanse wound with antibacterial soap and water 2. Anesthetic Topical Lidocaine 4% cream to wound bed prior to debridement 3. Peri-wound Care: Moisturizing lotion 4. Dressing Applied: Prisma Ag 5. Secondary Dressing Applied Dry Gauze 7. Secured with Tape Weisenberger, Caliya H. ([id number]) Erick Blinks Bilaterally Electronic Signature(s) Signed: 10/03/2015 4:57:17 PM By: Alejandro Mulling Entered By: Alejandro Mulling on 10/03/2015 08:27:54 Andreasen, Colleen Rexene [name] ([id number]) -------------------------------------------------------------------------------- Wound Assessment  Details Patient Name: [name], Sherell H. Date of Service: 10/03/2015 8:00 AM Medical Record Patient Account Number: [id number] Number: Treating RN: Phillis Haggis April 12, 1956 (59 y.o. Other Clinician: Date of Birth/Sex: Female) Treating ROBSON, MICHAEL Primary Care Physician/Extender: Comer Locket Physician: Referring Physician: Fidel Levy Weeks in Treatment: 35 Wound Status Wound Number: 8 Primary Etiology: Venous Leg Ulcer Wound Location: Right Lower Leg - Posterior Wound Status: Open Wounding Event: Gradually Appeared Comorbid History: Hypertension, Type II Diabetes Date Acquired: 03/27/2015 Weeks Of Treatment: 26 Clustered Wound: No Photos Photo Uploaded By: Alejandro Mulling on 10/03/2015 12:56:58 Wound Measurements Length: (cm) 2.5 Width: (cm) 1 Depth: (cm) 0.1 Area: (cm) 1.963 Volume: (cm) 0.196 % Reduction in Area: -11.1% % Reduction in Volume: -10.7% Epithelialization: None Tunneling: No Undermining: No Wound Description Full Thickness Without Exposed Foul Odor Classification: Support Structures Diabetic Severity Grade 1 (Wagner): Wound Margin: Flat and Intact Exudate Amount: Large Exudate Type:  Serosanguineous Exudate Color: red, brown Grenier, Patte H. ([id number]) After Cleansing: No Wound Bed Granulation Amount: Large (67-100%) Exposed Structure Granulation Quality: Red, Hyper-granulation Fascia Exposed: No Necrotic Amount: Small (1-33%) Fat Layer Exposed: No Necrotic Quality: Eschar, Adherent Slough Tendon Exposed: No Muscle Exposed: No Joint Exposed: No Bone Exposed: No Limited to Skin Breakdown Periwound Skin Texture Texture Color No Abnormalities Noted: No No Abnormalities Noted: No Callus: No Atrophie Blanche: No Crepitus: No Cyanosis: No Excoriation: No Ecchymosis: No Fluctuance: No Erythema: No Friable: No Hemosiderin Staining: No Induration: No Mottled: No Localized Edema: Yes Pallor:  No Rash: No Rubor: No Scarring: No Temperature / Pain Moisture Temperature: No Abnormality No Abnormalities Noted: No Tenderness on Palpation: Yes Dry / Scaly: No Maceration: No Moist: Yes Wound Preparation Ulcer Cleansing: Other: soap and water, Topical Anesthetic Applied: Other: lidocaine 4%, Treatment Notes Wound #8 (Right, Posterior Lower Leg) 1. Cleansed with: Cleanse wound with antibacterial soap and water 2. Anesthetic Topical Lidocaine 4% cream to wound bed prior to debridement 3. Peri-wound Care: Moisturizing lotion 4. Dressing Applied: Prisma Ag 5. Secondary Dressing Applied Dry Gauze 7. Secured with Tape Kunesh, Jakita H. ([id number]) Erick Blinks Bilaterally Electronic Signature(s) Signed: 10/03/2015 4:57:17 PM By: Alejandro Mulling Entered By: Alejandro Mulling on 10/03/2015 08:24:35 Piccini, Nakyiah Rexene [name] ([id number]) -------------------------------------------------------------------------------- Vitals Details Patient Name: Ryser, Shealyn H. Date of Service: 10/03/2015 8:00 AM Medical Record Patient Account Number: [id number] Number: Treating RN: Phillis Haggis 1955-11-07 (59 y.o. Other Clinician: Date of Birth/Sex: Female) Treating ROBSON, MICHAEL Primary Care Physician/Extender: Comer Locket Physician: Referring Physician: Jones Broom in Treatment: 35 Vital Signs Time Taken: 08:07 Temperature (F): 98.0 Height (in): 68 Pulse (bpm): 82 Weight (lbs): 294 Respiratory Rate (breaths/min): 20 Body Mass Index (BMI): 44.7 Blood Pressure (mmHg): 166/96 Reference Range: 80 - 120 mg / dl Electronic Signature(s) Signed: 10/03/2015 4:57:17 PM By: Alejandro Mulling Entered By: Alejandro Mulling on 10/03/2015 08:10:49

## 2015-10-10 ENCOUNTER — Encounter: Payer: BC Managed Care – PPO | Admitting: Internal Medicine

## 2015-10-10 DIAGNOSIS — L97811 Non-pressure chronic ulcer of other part of right lower leg limited to breakdown of skin: Secondary | ICD-10-CM | POA: Diagnosis not present

## 2015-10-10 DIAGNOSIS — L97821 Non-pressure chronic ulcer of other part of left lower leg limited to breakdown of skin: Secondary | ICD-10-CM | POA: Diagnosis not present

## 2015-10-10 DIAGNOSIS — E11622 Type 2 diabetes mellitus with other skin ulcer: Secondary | ICD-10-CM | POA: Diagnosis not present

## 2015-10-10 DIAGNOSIS — I87313 Chronic venous hypertension (idiopathic) with ulcer of bilateral lower extremity: Secondary | ICD-10-CM | POA: Diagnosis not present

## 2015-10-12 NOTE — Progress Notes (Signed)
[name] ([id number]) Visit Report for 10/10/2015 Arrival Information Details Patient Name: [name] [name]. Date of Service: 10/10/2015 8:00 AM Medical Record Patient Account Number: [id number] Number: Treating RN: Phillis Haggis [birth year] (59 y.o. Other Clinician: Date of Birth/Sex: Female) Treating ROBSON, MICHAEL Primary Care Physician/Extender: Comer Locket Physician: Referring Physician: Jones Broom in Treatment: 5 Visit Information History Since Last Visit All ordered tests and consults were completed: No Patient Arrived: Ambulatory Added or deleted any medications: No Arrival Time: 08:09 Any new allergies or adverse reactions: No Accompanied By: self Had a fall or experienced change in No Transfer Assistance: None activities of daily living that may affect Patient Identification Verified: Yes risk of falls: Secondary Verification Process Yes Signs or symptoms of abuse/neglect since last No Completed: visito Patient Requires Transmission- No Hospitalized since last visit: No Based Precautions: Pain Present Now: No Patient Has Alerts: Yes Patient Alerts: DMII ABI [place] Bilateral Electronic Signature(s) Signed: 10/11/2015 6:14:28 PM By: Alejandro Mulling Entered By: Alejandro Mulling on 10/10/2015 08:09:38 [name] ([id number]) -------------------------------------------------------------------------------- Encounter Discharge Information Details Patient Name: [name]. Date of Service: 10/10/2015 8:00 AM Medical Record Patient Account Number: [id number] Number: Treating RN: Phillis Haggis 1955-12-19 (59 y.o. Other Clinician: Date of Birth/Sex: Female) Treating ROBSON, MICHAEL Primary Care Physician/Extender: Comer Locket Physician: Referring Physician: Jones Broom in Treatment: 66 Encounter Discharge Information Items Discharge Pain Level: 0 Discharge Condition:  Stable Ambulatory Status: Ambulatory Discharge Destination: Home Transportation: Private Auto Accompanied By: self Schedule Follow-up Appointment: Yes Medication Reconciliation completed and provided to Patient/Care Yes [name]: Provided on Clinical Summary of Care: 10/10/2015 Form Type Recipient Paper Patient PM Electronic Signature(s) Signed: 10/10/2015 9:07:37 AM By: Gwenlyn Perking Entered By: Gwenlyn Perking on 10/10/2015 09:07:37 [name] ([id number]) -------------------------------------------------------------------------------- Lower Extremity Assessment Details Patient Name: [name]. Date of Service: 10/10/2015 8:00 AM Medical Record Patient Account Number: [id number] Number: Treating RN: Phillis Haggis 02-Dec-1955 (59 y.o. Other Clinician: Date of Birth/Sex: Female) Treating ROBSON, MICHAEL Primary Care Physician/Extender: Comer Locket Physician: Referring Physician: Jones Broom in Treatment: 36 Edema Assessment Assessed: [Left: No] [Right: No] E[Left: dema] [Right: :] Calf Left: Right: Point of Measurement: cm From Medial Instep 43.2 cm 40.5 cm Ankle Left: Right: Point of Measurement: cm From Medial Instep 27.5 cm 25.5 cm Vascular Assessment Pulses: Posterior Tibial Dorsalis Pedis Palpable: [Left:Yes] [Right:Yes] Extremity colors, hair growth, and conditions: Extremity Color: [Left:Red] [Right:Red] Temperature of Extremity: [Left:Warm] [Right:Warm] Capillary Refill: [Left:< 3 seconds] [Right:< 3 seconds] Dependent Rubor: [Left:No] Toe Nail Assessment Left: Right: Thick: No No Discolored: No No Deformed: No No Improper Length and Hygiene: No Electronic Signature(s) Signed: 10/11/2015 6:14:28 PM By: [name] ([id number]) Entered By: Alejandro Mulling on 10/10/2015 08:21:24 [name].  ([id number]) -------------------------------------------------------------------------------- Multi Wound Chart Details Patient Name: [name]. Date of Service: 10/10/2015 8:00 AM Medical Record Patient Account Number: [id number] Number: Treating RN: Phillis Haggis [birth year] (59 y.o. Other Clinician: Date of Birth/Sex: Female) Treating ROBSON, MICHAEL Primary Care Physician/Extender: Comer Locket Physician: Referring Physician: Jones Broom in Treatment: 36 Vital Signs Height(in): 68 Pulse(bpm): 80 Weight(lbs): 294 Blood Pressure 145/72 (mmHg): Body Mass Index(BMI): 45 Temperature(F): 98.1 Respiratory Rate 20 (breaths/min): Photos: [3:No Photos] [8:No Photos] [N/A:N/A] Wound Location: [3:Left Lower Leg - Lateral] [8:Right Lower Leg - Posterior] [N/A:N/A] Wounding Event: [3:Gradually Appeared] [8:Gradually Appeared] [N/A:N/A] Primary Etiology: [3:Venous Leg Ulcer] [8:Venous Leg Ulcer] [N/A:N/A]  Comorbid History: [3:Hypertension, Type II Diabetes] [8:Hypertension, Type II Diabetes] [N/A:N/A] Date Acquired: [3:01/09/2015] [8:03/27/2015] [N/A:N/A] Weeks of Treatment: [3:36] [8:27] [N/A:N/A] Wound Status: [3:Open] [8:Open] [N/A:N/A] Measurements L x W x D 0.4x0.4x0.1 [8:1.5x0.6x0.1] [N/A:N/A] (cm) Area (cm) : [3:0.126] [8:0.707] [N/A:N/A] Volume (cm) : [3:0.013] [8:0.071] [N/A:N/A] % Reduction in Area: [3:98.90%] [8:60.00%] [N/A:N/A] % Reduction in Volume: 98.80% [8:59.90%] [N/A:N/A] Classification: [3:Full Thickness Without Exposed Support Structures] [8:Full Thickness Without Exposed Support Structures] [N/A:N/A] HBO Classification: [3:Grade 1] [8:Grade 1] [N/A:N/A] Exudate Amount: [3:Large] [8:Large] [N/A:N/A] Exudate Type: [3:Serosanguineous] [8:Serosanguineous] [N/A:N/A] Exudate Color: [3:red, brown] [8:red, brown] [N/A:N/A] Wound Margin: [3:Flat and Intact] [8:Flat and Intact] [N/A:N/A] Granulation Amount: [3:None Present (0%)]  [8:Medium (34-66%)] [N/A:N/A] Granulation Quality: [3:N/A] [8:Red, Hyper-granulation] [N/A:N/A] Necrotic Amount: [3:Large (67-100%)] [8:Medium (34-66%)] [N/A:N/A] Necrotic Tissue: Eschar, Adherent Slough Eschar, Adherent Slough N/A Exposed Structures: Fascia: No Fascia: No N/A Fat: No Fat: No Tendon: No Tendon: No Muscle: No Muscle: No Joint: No Joint: No Bone: No Bone: No Limited to Skin Limited to Skin Breakdown Breakdown Epithelialization: Small (1-33%) None N/A Periwound Skin Texture: Scarring: Yes Edema: Yes N/A Edema: No Excoriation: No Excoriation: No Induration: No Induration: No Callus: No Callus: No Crepitus: No Crepitus: No Fluctuance: No Fluctuance: No Friable: No Friable: No Rash: No Rash: No Scarring: No Periwound Skin Moist: Yes Moist: Yes N/A Moisture: Maceration: No Maceration: No Dry/Scaly: No Dry/Scaly: No Periwound Skin Color: Atrophie Blanche: No Atrophie Blanche: No N/A Cyanosis: No Cyanosis: No Ecchymosis: No Ecchymosis: No Erythema: No Erythema: No Hemosiderin Staining: No Hemosiderin Staining: No Mottled: No Mottled: No Pallor: No Pallor: No Rubor: No Rubor: No Temperature: No Abnormality No Abnormality N/A Tenderness on Yes Yes N/A Palpation: Wound Preparation: Ulcer Cleansing: Other: Ulcer Cleansing: Other: N/A soap and water soap and water Topical Anesthetic Topical Anesthetic Applied: Other: lidocaine Applied: Other: lidocaine 4% 4% Treatment Notes Electronic Signature(s) Signed: 10/11/2015 6:14:28 PM By: Alejandro Mulling Entered By: Alejandro Mulling on 10/10/2015 08:39:58 Passarelli, [name] ([id number]) -------------------------------------------------------------------------------- Multi-Disciplinary Care Plan Details Patient Name: [name]. Date of Service: 10/10/2015 8:00 AM Medical Record Patient Account Number: [id number] Number: Treating RN: Phillis Haggis 1955-09-11 (59 y.o. Other  Clinician: Date of Birth/Sex: Female) Treating ROBSON, MICHAEL Primary Care Physician/Extender: Comer Locket Physician: Referring Physician: Jones Broom in Treatment: 36 Active Inactive Orientation to the Wound Care Program Nursing Diagnoses: Knowledge deficit related to the wound healing center program Goals: Patient/caregiver will verbalize understanding of the Wound Healing Center Program Date Initiated: 01/30/2015 Goal Status: Active Interventions: Provide education on orientation to the wound center Notes: Pain, Acute or Chronic Nursing Diagnoses: Pain, acute or chronic: actual or potential Goals: Patient will verbalize adequate pain control and receive pain control interventions during procedures as needed Date Initiated: 01/30/2015 Goal Status: Active Patient/caregiver will verbalize adequate pain control between visits Date Initiated: 01/30/2015 Goal Status: Active Interventions: Assess comfort goal upon admission Encourage patient to take pain medications as prescribed Notes: [name]. ([id number]) Venous Leg Ulcer Nursing Diagnoses: Potential for venous Insuffiency (use before diagnosis confirmed) Goals: Non-invasive venous studies are completed as ordered Date Initiated: 01/30/2015 Goal Status: Active Interventions: Assess peripheral edema status every visit. Notes: Wound/Skin Impairment Nursing Diagnoses: Impaired tissue integrity Goals: Ulcer/skin breakdown will have a volume reduction of 30% by week 4 Date Initiated: 01/30/2015 Goal Status: Active Ulcer/skin breakdown will have a volume reduction of 50% by week 8 Date Initiated: 01/30/2015 Goal Status: Active Ulcer/skin breakdown will have a volume reduction of 80% by week  12 Date Initiated: 01/30/2015 Goal Status: Active Ulcer/skin breakdown will heal within 14 weeks Date Initiated: 01/30/2015 Goal Status: Active Interventions: Assess ulceration(s) every  visit Notes: Electronic Signature(s) Signed: 10/11/2015 6:14:28 PM By: Alejandro MullingPinkerton, Debra Entered By: Alejandro MullingPinkerton, Debra on 10/10/2015 08:39:49 [name]. ([id number]) -------------------------------------------------------------------------------- Pain Assessment Details Patient Name: [name]. Date of Service: 10/10/2015 8:00 AM Medical Record Patient Account Number: [id number] Number: Treating RN: Phillis Haggisinkerton, Debi [birth year] (59 y.o. Other Clinician: Date of Birth/Sex: Female) Treating ROBSON, MICHAEL Primary Care Physician/Extender: Comer LocketG HAWKINS JR, JAMES Physician: Referring Physician: Jones BroomHAWKINS JR, JAMES Weeks in Treatment: 4436 Active Problems Location of Pain Severity and Description of Pain Patient Has Paino No Site Locations Pain Management and Medication Current Pain Management: Electronic Signature(s) Signed: 10/11/2015 6:14:28 PM By: Alejandro MullingPinkerton, Debra Entered By: Alejandro MullingPinkerton, Debra on 10/10/2015 08:09:45 Kleier, Kamilya Rexene EdisonH. ([id number]) -------------------------------------------------------------------------------- Patient/Caregiver Education Details Patient Name: Mast, [name]. Date of Service: 10/10/2015 8:00 AM Medical Record Patient Account Number: [id number] Number: Treating RN: Phillis Haggisinkerton, Debi [birth year] (59 y.o. Other Clinician: Date of Birth/Gender: Female) Treating ROBSON, MICHAEL Primary Care Physician/Extender: Comer LocketG HAWKINS JR, JAMES Physician: Weeks in Treatment: 36 Referring Physician: Fidel LevyHAWKINS JR, JAMES Education Assessment Education Provided To: Patient Education Topics Provided Wound/Skin Impairment: Handouts: Other: do not get wraps wet Methods: Demonstration, Explain/Verbal Responses: State content correctly Electronic Signature(s) Signed: 10/11/2015 6:14:28 PM By: Alejandro MullingPinkerton, Debra Entered By: Alejandro MullingPinkerton, Debra on 10/10/2015 09:05:19 Faires, Numa [name].  ([id number]) -------------------------------------------------------------------------------- Wound Assessment Details Patient Name: [name]. Date of Service: 10/10/2015 8:00 AM Medical Record Patient Account Number: [id number] Number: Treating RN: Phillis Haggisinkerton, Debi [birth year] (59 y.o. Other Clinician: Date of Birth/Sex: Female) Treating ROBSON, MICHAEL Primary Care Physician/Extender: Comer LocketG HAWKINS JR, JAMES Physician: Referring Physician: Fidel LevyHAWKINS JR, JAMES Weeks in Treatment: 36 Wound Status Wound Number: 3 Primary Etiology: Venous Leg Ulcer Wound Location: Left Lower Leg - Lateral Wound Status: Open Wounding Event: Gradually Appeared Comorbid History: Hypertension, Type II Diabetes Date Acquired: 01/09/2015 Weeks Of Treatment: 36 Clustered Wound: No Photos Photo Uploaded By: Alejandro MullingPinkerton, Debra on 10/10/2015 16:58:16 Wound Measurements Length: (cm) 0.4 Width: (cm) 0.4 Depth: (cm) 0.1 Area: (cm) 0.126 Volume: (cm) 0.013 % Reduction in Area: 98.9% % Reduction in Volume: 98.8% Epithelialization: Small (1-33%) Tunneling: No Undermining: No Wound Description Full Thickness Without Exposed Foul Odor Classification: Support Structures Diabetic Severity Grade 1 (Wagner): Wound Margin: Flat and Intact Exudate Amount: Large Exudate Type: Serosanguineous Exudate Color: red, brown [name]. ([id number]) After Cleansing: No Wound Bed Granulation Amount: None Present (0%) Exposed Structure Necrotic Amount: Large (67-100%) Fascia Exposed: No Necrotic Quality: Eschar, Adherent Slough Fat Layer Exposed: No Tendon Exposed: No Muscle Exposed: No Joint Exposed: No Bone Exposed: No Limited to Skin Breakdown Periwound Skin Texture Texture Color No Abnormalities Noted: No No Abnormalities Noted: No Callus: No Atrophie Blanche: No Crepitus: No Cyanosis: No Excoriation: No Ecchymosis: No Fluctuance: No Erythema: No Friable: No Hemosiderin  Staining: No Induration: No Mottled: No Localized Edema: No Pallor: No Rash: No Rubor: No Scarring: Yes Temperature / Pain Moisture Temperature: No Abnormality No Abnormalities Noted: No Tenderness on Palpation: Yes Dry / Scaly: No Maceration: No Moist: Yes Wound Preparation Ulcer Cleansing: Other: soap and water, Topical Anesthetic Applied: Other: lidocaine 4%, Treatment Notes Wound #3 (Left, Lateral Lower Leg) 1. Cleansed with: Cleanse wound with antibacterial soap and water 2. Anesthetic Topical Lidocaine 4% cream to wound bed prior to debridement 3. Peri-wound Care: Barrier cream 4. Dressing Applied: Prisma Ag 5. Secondary Dressing Applied Dry Gauze  7. Secured with Tape [name]. ([id number]) Erick Blinks Bilaterally Electronic Signature(s) Signed: 10/11/2015 6:14:28 PM By: Alejandro Mulling Entered By: Alejandro Mulling on 10/10/2015 08:30:45 Demas, Narya Rexene [name] ([id number]) -------------------------------------------------------------------------------- Wound Assessment Details Patient Name: [name]. Date of Service: 10/10/2015 8:00 AM Medical Record Patient Account Number: [id number] Number: Treating RN: Phillis Haggis February 02, 1956 (59 y.o. Other Clinician: Date of Birth/Sex: Female) Treating ROBSON, MICHAEL Primary Care Physician/Extender: Comer Locket Physician: Referring Physician: Fidel Levy Weeks in Treatment: 36 Wound Status Wound Number: 8 Primary Etiology: Venous Leg Ulcer Wound Location: Right Lower Leg - Posterior Wound Status: Open Wounding Event: Gradually Appeared Comorbid History: Hypertension, Type II Diabetes Date Acquired: 03/27/2015 Weeks Of Treatment: 27 Clustered Wound: No Photos Photo Uploaded By: Alejandro Mulling on 10/10/2015 16:58:21 Wound Measurements Length: (cm) 1.5 Width: (cm) 0.6 Depth: (cm) 0.1 Area: (cm) 0.707 Volume: (cm) 0.071 % Reduction in Area: 60% % Reduction in  Volume: 59.9% Epithelialization: None Tunneling: No Undermining: No Wound Description Full Thickness Without Exposed Foul Odor A Classification: Support Structures Diabetic Severity Grade 1 (Wagner): Wound Margin: Flat and Intact Exudate Amount: Large Exudate Type: Serosanguineous Exudate Color: red, brown [name]. ([id number]) fter Cleansing: No Wound Bed Granulation Amount: Medium (34-66%) Exposed Structure Granulation Quality: Red, Hyper-granulation Fascia Exposed: No Necrotic Amount: Medium (34-66%) Fat Layer Exposed: No Necrotic Quality: Eschar, Adherent Slough Tendon Exposed: No Muscle Exposed: No Joint Exposed: No Bone Exposed: No Limited to Skin Breakdown Periwound Skin Texture Texture Color No Abnormalities Noted: No No Abnormalities Noted: No Callus: No Atrophie Blanche: No Crepitus: No Cyanosis: No Excoriation: No Ecchymosis: No Fluctuance: No Erythema: No Friable: No Hemosiderin Staining: No Induration: No Mottled: No Localized Edema: Yes Pallor: No Rash: No Rubor: No Scarring: No Temperature / Pain Moisture Temperature: No Abnormality No Abnormalities Noted: No Tenderness on Palpation: Yes Dry / Scaly: No Maceration: No Moist: Yes Wound Preparation Ulcer Cleansing: Other: soap and water, Topical Anesthetic Applied: Other: lidocaine 4%, Treatment Notes Wound #8 (Right, Posterior Lower Leg) 1. Cleansed with: Cleanse wound with antibacterial soap and water 2. Anesthetic Topical Lidocaine 4% cream to wound bed prior to debridement 3. Peri-wound Care: Barrier cream 4. Dressing Applied: Prisma Ag 5. Secondary Dressing Applied Dry Gauze 7. Secured with Tape Karel, Nevaeh [name]. ([id number]) Erick Blinks Bilaterally Electronic Signature(s) Signed: 10/11/2015 6:14:28 PM By: Alejandro Mulling Entered By: Alejandro Mulling on 10/10/2015 08:31:30 Nieves, [name]  ([id number]) -------------------------------------------------------------------------------- Vitals Details Patient Name: Pranger, Ellora [name]. Date of Service: 10/10/2015 8:00 AM Medical Record Patient Account Number: [id number] Number: Treating RN: Phillis Haggis 07/26/1955 (59 y.o. Other Clinician: Date of Birth/Sex: Female) Treating ROBSON, MICHAEL Primary Care Physician/Extender: Comer Locket Physician: Referring Physician: Jones Broom in Treatment: 36 Vital Signs Time Taken: 08:12 Temperature (F): 98.1 Height (in): 68 Pulse (bpm): 80 Weight (lbs): 294 Respiratory Rate (breaths/min): 20 Body Mass Index (BMI): 44.7 Blood Pressure (mmHg): 145/72 Reference Range: 80 - 120 mg / dl Electronic Signature(s) Signed: 10/11/2015 6:14:28 PM By: Alejandro Mulling Entered By: Alejandro Mulling on 10/10/2015 08:13:36

## 2015-10-12 NOTE — Progress Notes (Signed)
VENORA, KAUTZMAN ([id number]) Visit Report for 10/10/2015 Chief Complaint Document Details Patient Name: VELIA, PAMER. Date of Service: 10/10/2015 8:00 AM Medical Record Patient Account Number: [id number] Number: Treating RN: Phillis Haggis 1956-07-14 (60 y.o. Other Clinician: Date of Birth/Sex: Female) Treating Lathen Seal Primary Care Physician/Extender: Comer Locket Physician: Referring Physician: Jones Broom in Treatment: 86 Information Obtained from: Patient Chief Complaint Chronic bilateral calf ulcers. Electronic Signature(s) Signed: 10/10/2015 6:33:23 PM By: Baltazar Najjar MD Entered By: Baltazar Najjar on 10/10/2015 08:58:29 Krenn, [name] ([id number]) -------------------------------------------------------------------------------- Debridement Details Patient Name: Fleener, Karolynn H. Date of Service: 10/10/2015 8:00 AM Medical Record Patient Account Number: [id number] Number: Treating RN: Phillis Haggis 1956/03/02 (60 y.o. Other Clinician: Date of Birth/Sex: Female) Treating Sofia Vanmeter Primary Care Physician/Extender: Comer Locket Physician: Referring Physician: Jones Broom in Treatment: 36 Debridement Performed for Wound #3 Left,Lateral Lower Leg Assessment: Performed By: Physician Maxwell Caul, MD Debridement: Debridement Pre-procedure Yes Verification/Time Out Taken: Start Time: 08:40 Pain Control: Other : lidocaine 4% cream Level: Skin/Subcutaneous Tissue Total Area Debrided (L x 0.4 (cm) x 0.4 (cm) = 0.16 (cm) W): Tissue and other Viable, Non-Viable, Exudate, Fibrin/Slough, Subcutaneous material debrided: Instrument: Curette Bleeding: Minimum Hemostasis Achieved: Pressure End Time: 08:43 Procedural Pain: 0 Post Procedural Pain: 0 Response to Treatment: Procedure was tolerated well Post Debridement Measurements of Total Wound Length: (cm) 0.4 Width: (cm)  0.4 Depth: (cm) 0.1 Volume: (cm) 0.013 Post Procedure Diagnosis Same as Pre-procedure Electronic Signature(s) Signed: 10/10/2015 6:33:23 PM By: Baltazar Najjar MD Signed: 10/11/2015 6:14:28 PM By: Alejandro Mulling Entered By: Baltazar Najjar on 10/10/2015 08:57:45 Schewe, [name] ([id number]) Flanagan, Ilena H. ([id number]) -------------------------------------------------------------------------------- Debridement Details Patient Name: Revels, Taina H. Date of Service: 10/10/2015 8:00 AM Medical Record Patient Account Number: [id number] Number: Treating RN: Phillis Haggis March 21, 1956 (60 y.o. Other Clinician: Date of Birth/Sex: Female) Treating Orbin Mayeux Primary Care Physician/Extender: Comer Locket Physician: Referring Physician: Jones Broom in Treatment: 36 Debridement Performed for Wound #8 Right,Posterior Lower Leg Assessment: Performed By: Physician Maxwell Caul, MD Debridement: Debridement Pre-procedure Yes Verification/Time Out Taken: Start Time: 08:45 Pain Control: Other : lidocaine 4% cream Level: Skin/Subcutaneous Tissue Total Area Debrided (L x 1.5 (cm) x 0.6 (cm) = 0.9 (cm) W): Tissue and other Viable, Non-Viable, Exudate, Fibrin/Slough, Subcutaneous material debrided: Instrument: Curette Bleeding: Minimum Hemostasis Achieved: Pressure End Time: 08:48 Procedural Pain: 0 Post Procedural Pain: 0 Response to Treatment: Procedure was tolerated well Post Debridement Measurements of Total Wound Length: (cm) 1.5 Width: (cm) 0.6 Depth: (cm) 0.1 Volume: (cm) 0.071 Post Procedure Diagnosis Same as Pre-procedure Electronic Signature(s) Signed: 10/10/2015 6:33:23 PM By: Baltazar Najjar MD Signed: 10/11/2015 6:14:28 PM By: Alejandro Mulling Entered By: Baltazar Najjar on 10/10/2015 08:58:12 Leiter, [name] ([id number]) Dowdell, Tyshika H.  ([id number]) -------------------------------------------------------------------------------- HPI Details Patient Name: Chopin, Oza H. Date of Service: 10/10/2015 8:00 AM Medical Record Patient Account Number: [id number] Number: Treating RN: Phillis Haggis 1955/08/09 (60 y.o. Other Clinician: Date of Birth/Sex: Female) Treating Leandre Wien Primary Care Physician/Extender: Comer Locket Physician: Referring Physician: Jones Broom in Treatment: 60 History of Present Illness HPI Description: Pleasant 60 year old with h/o DM (Hgb A1c 7.4 in Aug 2016), ESRD (on hemodialysis since July 2016). Presented to her PCP, Dr. Venora Maples, with BLE ulcers since June 2016. 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  Left lower extremity ultrasound in May 2016 showed no evidence for DVT. No assessment for venous insufficiency. Culture 04/19/2015 grew methicillin sensitive staph aureus (sensitive to tetracycline), Stenotrophomonas maltophilia, and Enterococcus faecalis. Completed course of doxycycline. Hospitalized in Nov 2016 for worsening cellulitis. Treated with IV antibiotics. No operative debridement or biopsy. Punch biopsy of left calf ulcer 06/28/2015 showed no evidence for malignancy. Bilateral lower extremity venous ultrasound 07/20/2015 showed no evidence for DVT or chronic venous insufficiency. Performing dressing changes with silver alginate. Tolerating Profore light bilaterally 3x/week. She returns to clinic and is without complaints. No significant pain. No fever or chills. Less drainage. 08/09/2015 -- details over the last several months noted. Continues to stay hemodialysis 3 times a week. Says she is improving slowly and the pain is much better 08/16/15; this patient has wounds on her bilateral lower extremities which is been present  since June 2016. The exact etiology of this is not really clear although she was hospitalized in October for cellulitis. I'm not sure if this was felt to be bilateral. In any case she had a debridement last week. She is using Aquacel Ag. Per the nursing staff the wounds continue to do nicely 08/22/15; patient's wounds were covered in a surface eschar with surrounding slough . All of this underwent a surgical debridement including slough over the actual wound bed removing subcutaneous tissue. The wounds on the right lateral leg and right lateral leg all have improved quite nicely. She has been using Aquacel Ag 08/29/15 patient has bilateral leg wounds which are probably largely venous insufficiency. She does not have infection. All the wounds underwent surgical debridement with a curette. This was to remove fibrinous surface slough and circumferential callus and nonviable subcutaneous tissue and skin. She tolerates this quite well. I changed her to Iodoflex last week. Dressings are [facility] 09/06/15; her wounds continued to improve both in appearance and in volume. We had reduced her from a Profore to a Profore light last week due to blistering on her skin. The blistering is better but her edema control is not so good. She still has itchy excoriated scan which makes me wonder whether she might be reacting to the surface layer of the Profore wraps. Her wounds required surgical debridement but again both of them look better. TAMEKIA, ROTTER ([id number]) 09/12/15 continued improvement in the appearance of the wounds undergoing a surgical debridement of circumferential callus, fibrinous surface slough and nonviable subcutaneous tissue. Her skin is a lot better leading me to believe she had some form of contact dermatitis with the skin contact layer of both the Profore in Profore light wraps. 09/19/15 the patient's wounds which are predominantly on the left anterior lateral leg  are improved no debridement was required. On the right anterior lateral leg she has a very small open wound with some depth and a superficial wound posterior to that. We have been using Iodoflex 09/26/15; the patient continues to make good improvement in her wound care. Surgical debridement done to remove surface slough callus and nonviable subcutaneous tissue although this looks considerably better. 2 small open areas on the lateral aspect of the left leg have a healthy granulated base. She has a pinpoint area on the right. We have been using Iodoflex on the left and Iodosorb on the right 10/03/15 I filled out a pinpoint wound on the left leg. One of the areas on the right leg is healed as well. The other area on the right leg is small but  very dry. Surprisingly the superficial area on the left leg posterior to the pinpoint open area is the one area that has not progressed towards healing 10/10/15 the patient has a small open area on the left leg after debridement surface eschar and nonviable subcutaneous tissue. Surprisingly the most substantial remaining open area is on the right lateral leg. We changed to [facility] last week because of the dry wound surface Electronic Signature(s) Signed: 10/10/2015 6:33:23 PM By: Baltazar Najjar MD Entered By: Baltazar Najjar on 10/10/2015 08:59:48 Aye, [name] ([id number]) -------------------------------------------------------------------------------- Physical Exam Details Patient Name: Schorr, Junetta H. Date of Service: 10/10/2015 8:00 AM Medical Record Patient Account Number: [id number] Number: Treating RN: Phillis Haggis [birth year] (59 y.o. Other Clinician: Date of Birth/Sex: Female) Treating Jolane Bankhead Primary Care Physician/Extender: Comer Locket Physician: Referring Physician: Jones Broom in Treatment: 36 Notes Wound exam; peripheral pulses are intact there is no evidence of infection.  Her edema is adequately controlled. She had 2 open areas on her left lateral leg only one has any opening and this is reasonably tiny with healthy granulation on the right there is more substantial wound although it is small and appears healthy. It was to provided as well of eschar and nonviable subcutaneous tissue. Electronic Signature(s) Signed: 10/10/2015 6:33:23 PM By: Baltazar Najjar MD Entered By: Baltazar Najjar on 10/10/2015 09:00:51 Holway, [name] ([id number]) -------------------------------------------------------------------------------- Physician Orders Details Patient Name: Isa, Masaye H. Date of Service: 10/10/2015 8:00 AM Medical Record Patient Account Number: [id number] Number: Treating RN: Phillis Haggis [birth year] (59 y.o. Other Clinician: Date of Birth/Sex: Female) Treating Monae Topping Primary Care Physician/Extender: Comer Locket Physician: Referring Physician: Jones Broom in Treatment: 85 Verbal / Phone Orders: Yes Clinician: Ashok Cordia, Debi Read Back and Verified: Yes Diagnosis Coding Wound Cleansing Wound #3 Left,Lateral Lower Leg o Clean wound with Normal Saline. o Cleanse wound with mild soap and water o May Shower, gently pat wound dry prior to applying new dressing. Wound #8 Right,Posterior Lower Leg o Clean wound with Normal Saline. o Cleanse wound with mild soap and water o May Shower, gently pat wound dry prior to applying new dressing. Anesthetic Wound #3 Left,Lateral Lower Leg o Topical Lidocaine 4% cream applied to wound bed prior to debridement Wound #8 Right,Posterior Lower Leg o Topical Lidocaine 4% cream applied to wound bed prior to debridement Skin Barriers/Peri-Wound Care Wound #3 Left,Lateral Lower Leg o Barrier cream o Moisturizing lotion Wound #8 Right,Posterior Lower Leg o Moisturizing lotion Primary Wound Dressing Wound #3 Left,Lateral Lower Leg o Prisma  Ag Wound #8 Right,Posterior Lower Leg o Prisma Ag Ansell, Yoali H. ([id number]) Secondary Dressing Wound #3 Left,Lateral Lower Leg o Dry Gauze Wound #8 Right,Posterior Lower Leg o Dry Gauze Dressing Change Frequency Wound #3 Left,Lateral Lower Leg o Change dressing every week Wound #8 Right,Posterior Lower Leg o Change dressing every week Follow-up Appointments Wound #3 Left,Lateral Lower Leg o Return Appointment in 1 week. Wound #8 Right,Posterior Lower Leg o Return Appointment in 1 week. Edema Control Wound #3 Left,Lateral Lower Leg o Unna Boots Bilaterally o Elevate legs to the level of the heart and pump ankles as often as possible - Elevate legs at Dialysis Wound #8 Right,Posterior Lower Leg o Unna Boots Bilaterally o Elevate legs to the level of the heart and pump ankles as often as possible - Elevate legs at Dialysis Off-Loading Wound #3 Left,Lateral Lower Leg o Turn and reposition every 2 hours Wound #8 Right,Posterior Lower  Leg o Turn and reposition every 2 hours Additional Orders / Instructions Wound #3 Left,Lateral Lower Leg o Activity as tolerated Wound #8 Right,Posterior Lower Leg o Activity as tolerated Decamp, Lashannon H. ([id number]) Electronic Signature(s) Signed: 10/10/2015 6:33:23 PM By: Baltazar Najjar MD Signed: 10/11/2015 6:14:28 PM By: Alejandro Mulling Entered By: Alejandro Mulling on 10/10/2015 08:46:34 Kallenberger, Lashell H. ([id number]) -------------------------------------------------------------------------------- Problem List Details Patient Name: Jensen, Cynethia H. Date of Service: 10/10/2015 8:00 AM Medical Record Patient Account Number: [id number] Number: Treating RN: Phillis Haggis 08-24-1955 (59 y.o. Other Clinician: Date of Birth/Sex: Female) Treating Cesario Weidinger Primary Care Physician/Extender: Comer Locket Physician: Referring Physician: Fidel Levy Weeks in Treatment:  48 Active Problems ICD-10 Encounter Code Description Active Date Diagnosis E11.622 Type 2 diabetes mellitus with other skin ulcer 01/30/2015 Yes N18.6 End stage renal disease 01/30/2015 Yes L97.222 Non-pressure chronic ulcer of left calf with fat layer 01/30/2015 Yes exposed L97.212 Non-pressure chronic ulcer of right calf with fat layer 01/30/2015 Yes exposed R60.0 Localized edema 04/19/2015 Yes I89.0 Lymphedema, not elsewhere classified 07/12/2015 Yes Inactive Problems Resolved Problems Electronic Signature(s) Signed: 10/10/2015 6:33:23 PM By: Baltazar Najjar MD Entered By: Baltazar Najjar on 10/10/2015 08:56:55 Wurzer, [name] ([id number]) -------------------------------------------------------------------------------- Progress Note Details Patient Name: Scheunemann, Tarena H. Date of Service: 10/10/2015 8:00 AM Medical Record Patient Account Number: [id number] Number: Treating RN: Phillis Haggis 1956/04/28 (59 y.o. Other Clinician: Date of Birth/Sex: Female) Treating Tasman Zapata Primary Care Physician/Extender: Comer Locket Physician: Referring Physician: Jones Broom in Treatment: 35 Subjective Chief Complaint Information obtained from Patient Chronic bilateral calf ulcers. History of Present Illness (HPI) Pleasant 60 year old with h/o DM (Hgb A1c 7.4 in Aug 2016), ESRD (on hemodialysis since July 2016). Presented to her PCP, Dr. Venora Maples, with BLE ulcers since June 2016. 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 Left lower extremity ultrasound in May 2016 showed no evidence for DVT. No assessment for venous insufficiency. Culture 04/19/2015 grew methicillin sensitive staph aureus (sensitive to tetracycline), Stenotrophomonas maltophilia, and Enterococcus faecalis. Completed course of doxycycline. Hospitalized in Nov  2016 for worsening cellulitis. Treated with IV antibiotics. No operative debridement or biopsy. Punch biopsy of left calf ulcer 06/28/2015 showed no evidence for malignancy. Bilateral lower extremity venous ultrasound 07/20/2015 showed no evidence for DVT or chronic venous insufficiency. Performing dressing changes with silver alginate. Tolerating Profore light bilaterally 3x/week. She returns to clinic and is without complaints. No significant pain. No fever or chills. Less drainage. 08/09/2015 -- details over the last several months noted. Continues to stay hemodialysis 3 times a week. Says she is improving slowly and the pain is much better 08/16/15; this patient has wounds on her bilateral lower extremities which is been present since June 2016. The exact etiology of this is not really clear although she was hospitalized in October for cellulitis. I'm not sure if this was felt to be bilateral. In any case she had a debridement last week. She is using Aquacel Ag. Per the nursing staff the wounds continue to do nicely 08/22/15; patient's wounds were covered in a surface eschar with surrounding slough . All of this underwent a surgical debridement including slough over the actual wound bed removing subcutaneous tissue. The wounds on the right lateral leg and right lateral leg all have improved quite nicely. She has been using Aquacel Ag 08/29/15 patient has bilateral leg wounds which are probably largely venous  insufficiency. She does not have infection. All the wounds underwent surgical debridement with a curette. This was to remove fibrinous surface slough and circumferential callus and nonviable subcutaneous tissue and skin. She tolerates this quite well. I changed her to Iodoflex last week. Dressings are being [facility] Melching, Carlynn H. ([id number]) 09/06/15; her wounds continued to improve both in appearance and in volume. We had reduced her from a Profore to a Profore  light last week due to blistering on her skin. The blistering is better but her edema control is not so good. She still has itchy excoriated scan which makes me wonder whether she might be reacting to the surface layer of the Profore wraps. Her wounds required surgical debridement but again both of them look better. 09/12/15 continued improvement in the appearance of the wounds undergoing a surgical debridement of circumferential callus, fibrinous surface slough and nonviable subcutaneous tissue. Her skin is a lot better leading me to believe she had some form of contact dermatitis with the skin contact layer of both the Profore in Profore light wraps. 09/19/15 the patient's wounds which are predominantly on the left anterior lateral leg are improved no debridement was required. On the right anterior lateral leg she has a very small open wound with some depth and a superficial wound posterior to that. We have been using Iodoflex 09/26/15; the patient continues to make good improvement in her wound care. Surgical debridement done to remove surface slough callus and nonviable subcutaneous tissue although this looks considerably better. 2 small open areas on the lateral aspect of the left leg have a healthy granulated base. She has a pinpoint area on the right. We have been using Iodoflex on the left and Iodosorb on the right 10/03/15 I filled out a pinpoint wound on the left leg. One of the areas on the right leg is healed as well. The other area on the right leg is small but very dry. Surprisingly the superficial area on the left leg posterior to the pinpoint open area is the one area that has not progressed towards healing 10/10/15 the patient has a small open area on the left leg after debridement surface eschar and nonviable subcutaneous tissue. Surprisingly the most substantial remaining open area is on the right lateral leg. We changed to [facility] last week because of the dry wound  surface Objective Constitutional Vitals Time Taken: 8:12 AM, Height: 68 in, Weight: 294 lbs, BMI: 44.7, Temperature: 98.1 F, Pulse: 80 bpm, Respiratory Rate: 20 breaths/min, Blood Pressure: 145/72 mmHg. Integumentary (Hair, Skin) Wound #3 status is Open. Original cause of wound was Gradually Appeared. The wound is located on the Left,Lateral Lower Leg. The wound measures 0.4cm length x 0.4cm width x 0.1cm depth; 0.126cm^2 area and 0.013cm^3 volume. The wound is limited to skin breakdown. There is no tunneling or undermining noted. There is a large amount of serosanguineous drainage noted. The wound margin is flat and intact. There is no granulation within the wound bed. There is a large (67-100%) amount of necrotic tissue within the wound bed including Eschar and Adherent Slough. The periwound skin appearance exhibited: Scarring, Moist. The periwound skin appearance did not exhibit: Callus, Crepitus, Excoriation, Fluctuance, Friable, Induration, Localized Edema, Rash, Dry/Scaly, Maceration, Atrophie Blanche, Cyanosis, Ecchymosis, Hemosiderin Staining, Mottled, Pallor, Rubor, Erythema. Periwound temperature was noted as No Abnormality. The periwound has tenderness on palpation. Wound #8 status is Open. Original cause of wound was Gradually Appeared. The wound is located on the Right,Posterior Lower  Leg. The wound measures 1.5cm length x 0.6cm width x 0.1cm depth; 0.707cm^2 area and 0.071cm^3 volume. The wound is limited to skin breakdown. There is no tunneling or undermining Stech, Ramla H. ([id number]) noted. There is a large amount of serosanguineous drainage noted. The wound margin is flat and intact. There is medium (34-66%) red granulation within the wound bed. There is a medium (34-66%) amount of necrotic tissue within the wound bed including Eschar and Adherent Slough. The periwound skin appearance exhibited: Localized Edema, Moist. The periwound skin appearance did not exhibit:  Callus, Crepitus, Excoriation, Fluctuance, Friable, Induration, Rash, Scarring, Dry/Scaly, Maceration, Atrophie Blanche, Cyanosis, Ecchymosis, Hemosiderin Staining, Mottled, Pallor, Rubor, Erythema. Periwound temperature was noted as No Abnormality. The periwound has tenderness on palpation. Assessment Active Problems ICD-10 E11.622 - Type 2 diabetes mellitus with other skin ulcer N18.6 - End stage renal disease L97.222 - Non-pressure chronic ulcer of left calf with fat layer exposed L97.212 - Non-pressure chronic ulcer of right calf with fat layer exposed R60.0 - Localized edema I89.0 - Lymphedema, not elsewhere classified Procedures Wound #3 Wound #3 is a Venous Leg Ulcer located on the Left,Lateral Lower Leg . There was a Skin/Subcutaneous Tissue Debridement ([id number]) debridement with total area of 0.16 sq cm performed by Maxwell CaulOBSON, Breyer Tejera G, MD. with the following instrument(s): Curette to remove Viable and Non-Viable tissue/material including Exudate, Fibrin/Slough, and Subcutaneous after achieving pain control using Other (lidocaine 4% cream). A time out was conducted prior to the start of the procedure. A Minimum amount of bleeding was controlled with Pressure. The procedure was tolerated well with a pain level of 0 throughout and a pain level of 0 following the procedure. Post Debridement Measurements: 0.4cm length x 0.4cm width x 0.1cm depth; 0.013cm^3 volume. Post procedure Diagnosis Wound #3: Same as Pre-Procedure Wound #8 Wound #8 is a Venous Leg Ulcer located on the Right,Posterior Lower Leg . There was a Skin/Subcutaneous Tissue Debridement ([id number]) debridement with total area of 0.9 sq cm performed by Maxwell CaulOBSON, Xinyi Batton G, MD. with the following instrument(s): Curette to remove Viable and Non-Viable tissue/material including Exudate, Fibrin/Slough, and Subcutaneous after achieving pain control using Other (lidocaine 4% cream). A time out was conducted prior to the  start of the procedure. A Minimum amount of bleeding was controlled with Pressure. The procedure was tolerated well with a pain level of 0 throughout and a pain level of 0 following the procedure. Post Debridement Measurements: 1.5cm length x 0.6cm width Inks, Audrina H. ([id number]) x 0.1cm depth; 0.071cm^3 volume. Post procedure Diagnosis Wound #8: Same as Pre-Procedure Plan Wound Cleansing: Wound #3 Left,Lateral Lower Leg: Clean wound with Normal Saline. Cleanse wound with mild soap and water May Shower, gently pat wound dry prior to applying new dressing. Wound #8 Right,Posterior Lower Leg: Clean wound with Normal Saline. Cleanse wound with mild soap and water May Shower, gently pat wound dry prior to applying new dressing. Anesthetic: Wound #3 Left,Lateral Lower Leg: Topical Lidocaine 4% cream applied to wound bed prior to debridement Wound #8 Right,Posterior Lower Leg: Topical Lidocaine 4% cream applied to wound bed prior to debridement Skin Barriers/Peri-Wound Care: Wound #3 Left,Lateral Lower Leg: Barrier cream Moisturizing lotion Wound #8 Right,Posterior Lower Leg: Moisturizing lotion Primary Wound Dressing: Wound #3 Left,Lateral Lower Leg: Prisma Ag Wound #8 Right,Posterior Lower Leg: Prisma Ag Secondary Dressing: Wound #3 Left,Lateral Lower Leg: Dry Gauze Wound #8 Right,Posterior Lower Leg: Dry Gauze Dressing Change Frequency: Wound #3 Left,Lateral Lower Leg: Change dressing every week Wound #8 Right,Posterior Lower Leg: Change  dressing every week Follow-up Appointments: Wound #3 Left,Lateral Lower Leg: Return Appointment in 1 week. Wound #8 Right,Posterior Lower Leg: Return Appointment in 1 week. TRINITA, DEVLIN ([id number]) Edema Control: Wound #3 Left,Lateral Lower Leg: Unna Boots Bilaterally Elevate legs to the level of the heart and pump ankles as often as possible - Elevate legs at Dialysis Wound #8 Right,Posterior Lower Leg: Unna Boots  Bilaterally Elevate legs to the level of the heart and pump ankles as often as possible - Elevate legs at Dialysis Off-Loading: Wound #3 Left,Lateral Lower Leg: Turn and reposition every 2 hours Wound #8 Right,Posterior Lower Leg: Turn and reposition every 2 hours Additional Orders / Instructions: Wound #3 Left,Lateral Lower Leg: Activity as tolerated Wound #8 Right,Posterior Lower Leg: Activity as tolerated Contineu prisma/hydrogel,unna all wounds are progressing towards closure Electronic Signature(s) Signed: 10/10/2015 6:33:23 PM By: Baltazar Najjar MD Entered By: Baltazar Najjar on 10/10/2015 09:03:01 Inscore, [name] ([id number]) -------------------------------------------------------------------------------- SuperBill Details Patient Name: Fill, Cherylanne H. Date of Service: 10/10/2015 Medical Record Patient Account Number: [id number] Number: Treating RN: Phillis Haggis 1955/10/15 (59 y.o. 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 right calf with fat layer exposed R60.0 Localized edema I89.0 Lymphedema, not elsewhere classified Facility Procedures CPT4 Code: 21308657 Description: 11042 - DEB SUBQ TISSUE 20 SQ CM/< ICD-10 Description Diagnosis L97.222 Non-pressure chronic ulcer of left calf with fat l Modifier: ayer exposed Quantity: 1 Physician Procedures CPT4 Code: 8469629 Description: 11042 - WC PHYS SUBQ TISS 20 SQ CM ICD-10 Description Diagnosis L97.222 Non-pressure chronic ulcer of left calf with fat l Modifier: ayer exposed Quantity: 1 Electronic  Signature(s) Signed: 10/10/2015 6:33:23 PM By: Baltazar Najjar MD Entered By: Baltazar Najjar on 10/10/2015 09:03:32

## 2015-10-18 ENCOUNTER — Encounter: Payer: BC Managed Care – PPO | Attending: Internal Medicine | Admitting: Internal Medicine

## 2015-10-18 DIAGNOSIS — N186 End stage renal disease: Secondary | ICD-10-CM | POA: Diagnosis not present

## 2015-10-18 DIAGNOSIS — I87313 Chronic venous hypertension (idiopathic) with ulcer of bilateral lower extremity: Secondary | ICD-10-CM | POA: Diagnosis not present

## 2015-10-18 DIAGNOSIS — I120 Hypertensive chronic kidney disease with stage 5 chronic kidney disease or end stage renal disease: Secondary | ICD-10-CM | POA: Insufficient documentation

## 2015-10-18 DIAGNOSIS — R600 Localized edema: Secondary | ICD-10-CM | POA: Diagnosis not present

## 2015-10-18 DIAGNOSIS — E11622 Type 2 diabetes mellitus with other skin ulcer: Secondary | ICD-10-CM | POA: Diagnosis not present

## 2015-10-18 DIAGNOSIS — Z992 Dependence on renal dialysis: Secondary | ICD-10-CM | POA: Insufficient documentation

## 2015-10-18 DIAGNOSIS — L97212 Non-pressure chronic ulcer of right calf with fat layer exposed: Secondary | ICD-10-CM | POA: Insufficient documentation

## 2015-10-18 DIAGNOSIS — E1122 Type 2 diabetes mellitus with diabetic chronic kidney disease: Secondary | ICD-10-CM | POA: Insufficient documentation

## 2015-10-18 DIAGNOSIS — I890 Lymphedema, not elsewhere classified: Secondary | ICD-10-CM | POA: Diagnosis not present

## 2015-10-18 DIAGNOSIS — L97222 Non-pressure chronic ulcer of left calf with fat layer exposed: Secondary | ICD-10-CM | POA: Diagnosis not present

## 2015-10-18 DIAGNOSIS — L97811 Non-pressure chronic ulcer of other part of right lower leg limited to breakdown of skin: Secondary | ICD-10-CM | POA: Diagnosis not present

## 2015-10-18 DIAGNOSIS — L97821 Non-pressure chronic ulcer of other part of left lower leg limited to breakdown of skin: Secondary | ICD-10-CM | POA: Diagnosis not present

## 2015-10-24 DIAGNOSIS — Z794 Long term (current) use of insulin: Secondary | ICD-10-CM | POA: Diagnosis not present

## 2015-10-24 DIAGNOSIS — E1122 Type 2 diabetes mellitus with diabetic chronic kidney disease: Secondary | ICD-10-CM | POA: Diagnosis not present

## 2015-10-24 DIAGNOSIS — Z992 Dependence on renal dialysis: Secondary | ICD-10-CM | POA: Diagnosis not present

## 2015-10-24 DIAGNOSIS — N186 End stage renal disease: Secondary | ICD-10-CM | POA: Diagnosis not present

## 2015-10-24 DIAGNOSIS — Z79899 Other long term (current) drug therapy: Secondary | ICD-10-CM | POA: Diagnosis not present

## 2015-10-24 NOTE — Progress Notes (Signed)
[name], Charlena H. ([id number]) Visit Report for 10/18/2015 Arrival Information Details Patient Name: [name], [name] Date of Service: 10/18/2015 8:00 AM Medical Record Patient Account Number: [id number] Number: Treating RN: Phillis Haggisinkerton, Debi [birth year] (59 y.o. Other Clinician: Date of Birth/Sex: Female) Treating ROBSON, MICHAEL Primary Care Physician/Extender: Comer LocketG HAWKINS JR, JAMES Physician: Referring Physician: Jones BroomHAWKINS JR, JAMES Weeks in Treatment: 37 Visit Information History Since Last Visit All ordered tests and consults were completed: No Patient Arrived: Ambulatory Added or deleted any medications: No Arrival Time: 08:07 Any new allergies or adverse reactions: No Accompanied By: self Had a fall or experienced change in No Transfer Assistance: None activities of daily living that may affect Patient Identification Verified: Yes risk of falls: Secondary Verification Process Yes Signs or symptoms of abuse/neglect since last No Completed: visito Patient Requires Transmission- No Hospitalized since last visit: No Based Precautions: Pain Present Now: No Patient Has Alerts: Yes Patient Alerts: DMII ABI [place] Bilateral Electronic Signature(s) Signed: 10/24/2015 4:14:54 PM By: Alejandro MullingPinkerton, Debra Entered By: Alejandro MullingPinkerton, Debra on 10/18/2015 08:07:56 Karwowski, Kemonie Rexene EdisonH. ([id number]) -------------------------------------------------------------------------------- Encounter Discharge Information Details Patient Name: [name], [name] Date of Service: 10/18/2015 8:00 AM Medical Record Patient Account Number: [id number] Number: Treating RN: Phillis Haggisinkerton, Debi [birth year] (59 y.o. Other Clinician: Date of Birth/Sex: Female) Treating ROBSON, MICHAEL Primary Care Physician/Extender: Comer LocketG HAWKINS JR, JAMES Physician: Referring Physician: Jones BroomHAWKINS JR, JAMES Weeks in Treatment: 8037 Encounter Discharge Information Items Discharge Pain Level: 0 Discharge Condition:  Stable Ambulatory Status: Ambulatory Discharge Destination: Home Transportation: Private Auto Accompanied By: self Schedule Follow-up Appointment: Yes Medication Reconciliation completed and provided to Patient/Care Yes Tashiya Souders: Provided on Clinical Summary of Care: 10/18/2015 Form Type Recipient Paper Patient PM Electronic Signature(s) Signed: 10/18/2015 8:53:41 AM By: Gwenlyn PerkingMoore, Shelia Entered By: Gwenlyn PerkingMoore, Shelia on 10/18/2015 08:53:41 Catino, Michal H. ([id number]) -------------------------------------------------------------------------------- Lower Extremity Assessment Details Patient Name: [name], [name] Date of Service: 10/18/2015 8:00 AM Medical Record Patient Account Number: [id number] Number: Treating RN: Phillis Haggisinkerton, Debi [birth year] (59 y.o. Other Clinician: Date of Birth/Sex: Female) Treating ROBSON, MICHAEL Primary Care Physician/Extender: Comer LocketG HAWKINS JR, JAMES Physician: Referring Physician: Jones BroomHAWKINS JR, JAMES Weeks in Treatment: 37 Edema Assessment Assessed: [Left: No] [Right: No] E[Left: dema] [Right: :] Calf Left: Right: Point of Measurement: 34 cm From Medial Instep 42.5 cm 40.5 cm Ankle Left: Right: Point of Measurement: 10 cm From Medial Instep 26.5 cm 25 cm Vascular Assessment Pulses: Posterior Tibial Dorsalis Pedis Palpable: [Left:Yes] [Right:Yes] Extremity colors, hair growth, and conditions: Extremity Color: [Left:Red] [Right:Red] Temperature of Extremity: [Left:Warm] [Right:Warm] Capillary Refill: [Left:< 3 seconds] [Right:< 3 seconds] Toe Nail Assessment Left: Right: Thick: No No Discolored: No No Deformed: No No Improper Length and Hygiene: No No Electronic Signature(s) Signed: 10/24/2015 4:14:54 PM By: Alejandro MullingPinkerton, Debra Entered By: Alejandro MullingPinkerton, Debra on 10/18/2015 08:23:40 Mcelmurry, Remington H. ([id number]) Nanna, Teneka H. ([id number]) -------------------------------------------------------------------------------- Multi Wound  Chart Details Patient Name: [name], [name] Date of Service: 10/18/2015 8:00 AM Medical Record Patient Account Number: [id number] Number: Treating RN: Phillis Haggisinkerton, Debi [birth year] (59 y.o. 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] [8:Venous Leg Ulcer] [N/A:N/A]  Comorbid History: [3:Hypertension, Type II Diabetes] [8:Hypertension, Type II Diabetes] [N/A:N/A] Date Acquired: [3:01/09/2015] [8:03/27/2015] [N/A:N/A] Weeks of Treatment: [3:37] [8:28] [N/A:N/A] Wound Status: [3:Open] [8:Open] [N/A:N/A] Measurements L x W x D 0.1x0.1x0.1 [8:0.1x0.1x0.1] [N/A:N/A] (cm) Area (cm) : [3:0.008] [8:0.008] [N/A:N/A] Volume (cm) : [3:0.001] [8:0.001] [N/A:N/A] % Reduction in Area: [3:99.90%] [8:99.50%] [N/A:N/A] % Reduction in Volume: 99.90% [8:99.40%] [N/A:N/A] Classification: [3:Full Thickness Without Exposed Support Structures] [8:Full Thickness Without Exposed Support Structures] [N/A:N/A] HBO Classification: [3:Grade 1] [8:Grade 1] [N/A:N/A] Exudate Amount: [3:Large] [8:Large] [N/A:N/A] Exudate Type: [3:Serosanguineous] [8:Serosanguineous] [N/A:N/A] Exudate Color: [3:red, brown] [8:red, brown] [N/A:N/A] Wound Margin: [3:Flat and Intact] [8:Flat and Intact] [N/A:N/A] Granulation Amount: [3:None Present (0%)] [8:Medium (34-66%)] [N/A:N/A] Granulation Quality: [3:N/A] [8:Red, Hyper-granulation] [N/A:N/A] Necrotic  Amount: [3:Large (67-100%)] [8:Medium (34-66%)] [N/A:N/A] Necrotic Tissue: Eschar, Adherent Slough Eschar, Adherent Slough N/A Exposed Structures: Fascia: No Fascia: No N/A Fat: No Fat: No Tendon: No Tendon: No Muscle: No Muscle: No Joint: No Joint: No Bone: No Bone: No Limited to Skin Limited to Skin Breakdown Breakdown Epithelialization: Small (1-33%) None N/A Periwound Skin Texture: Scarring: Yes Edema: Yes N/A Edema: No Excoriation: No Excoriation: No Induration: No Induration: No Callus: No Callus: No Crepitus: No Crepitus: No Fluctuance: No Fluctuance: No Friable: No Friable: No Rash: No Rash: No Scarring: No Periwound Skin Moist: Yes Moist: Yes N/A Moisture: Maceration: No Maceration: No Dry/Scaly: No Dry/Scaly: No Periwound Skin Color: Atrophie Blanche: No Atrophie Blanche: No N/A Cyanosis: No Cyanosis: No Ecchymosis: No Ecchymosis: No Erythema: No Erythema: No Hemosiderin Staining: No Hemosiderin Staining: No Mottled: No Mottled: No Pallor: No Pallor: No Rubor: No Rubor: No Temperature: No Abnormality No Abnormality N/A Tenderness on Yes Yes N/A Palpation: Wound Preparation: Ulcer Cleansing: Other: Ulcer Cleansing: Other: N/A soap and water soap and water Topical Anesthetic Topical Anesthetic Applied: Other: lidocaine Applied: Other: lidocaine 4% 4% Treatment Notes Electronic Signature(s) Signed: 10/24/2015 4:14:54 PM By: Alejandro Mulling Entered By: Alejandro Mulling on 10/18/2015 08:32:37 Minnie, [name] ([id number]) -------------------------------------------------------------------------------- Multi-Disciplinary Care Plan Details Patient Name: [name], [name] Date of Service: 10/18/2015 8:00 AM Medical Record Patient Account Number: [id number] Number: Treating RN: Phillis Haggis Dec 29, 1955 (59 y.o. Other Clinician: Date of Birth/Sex: Female) Treating ROBSON, MICHAEL Primary Care Physician/Extender: Comer Locket Physician: Referring Physician: Jones Broom in Treatment: 66 Active Inactive Orientation to the Wound Care Program Nursing Diagnoses: Knowledge deficit related to the wound healing center program Goals: Patient/caregiver will verbalize understanding of the Wound Healing Center Program Date Initiated: 01/30/2015 Goal Status: Active Interventions: Provide education on orientation to the wound center Notes: Pain, Acute or Chronic Nursing Diagnoses: Pain, acute or chronic: actual or potential Goals: Patient will verbalize adequate pain control and receive pain control interventions during procedures as needed Date Initiated: 01/30/2015 Goal Status: Active Patient/caregiver will verbalize adequate pain control between visits Date Initiated: 01/30/2015 Goal Status: Active Interventions: Assess comfort goal upon admission Encourage patient to take pain medications as prescribed Notes: Torbeck, Percy H. ([id number]) Venous Leg Ulcer Nursing Diagnoses: Potential for venous Insuffiency (use before diagnosis confirmed) Goals: Non-invasive venous studies are completed as ordered Date Initiated: 01/30/2015 Goal Status: Active Interventions: Assess peripheral edema status every visit. Notes: Wound/Skin Impairment Nursing Diagnoses: Impaired tissue integrity Goals: Ulcer/skin breakdown will have a volume reduction of 30% by week 4 Date Initiated: 01/30/2015 Goal Status: Active Ulcer/skin breakdown will have a volume reduction of 50% by week 8 Date Initiated: 01/30/2015 Goal Status: Active Ulcer/skin breakdown will have a volume reduction of 80% by week  12 Date Initiated: 01/30/2015 Goal Status: Active Ulcer/skin breakdown will heal within 14 weeks Date Initiated: 01/30/2015 Goal Status: Active Interventions: Assess ulceration(s) every visit Notes: Electronic Signature(s) Signed: 10/24/2015 4:14:54 PM By: Alejandro Mulling Entered By: Alejandro Mulling on  10/18/2015 08:32:30 [name], [name] ([id number]) -------------------------------------------------------------------------------- Pain Assessment Details Patient Name: [name], [name] Date of Service: 10/18/2015 8:00 AM Medical Record Patient Account Number: [id number] Number: Treating RN: Phillis Haggis 20-May-1956 (59 y.o. Other Clinician: Date of Birth/Sex: Female) Treating ROBSON, MICHAEL Primary Care Physician/Extender: Comer Locket Physician: Referring Physician: Jones Broom in Treatment: 37 Active Problems Location of Pain Severity and Description of Pain Patient Has Paino No Site Locations Pain Management and Medication Current Pain Management: Electronic Signature(s) Signed: 10/24/2015 4:14:54 PM By: Alejandro Mulling Entered By: Alejandro Mulling on 10/18/2015 08:08:03 [name], [name] ([id number]) -------------------------------------------------------------------------------- Patient/Caregiver Education Details Patient Name: Dupras, Anarie H. Date of Service: 10/18/2015 8:00 AM Medical Record Patient Account Number: [id number] Number: Treating RN: Phillis Haggis February 05, 1956 (59 y.o. Other Clinician: Date of Birth/Gender: Female) Treating ROBSON, MICHAEL Primary Care Physician/Extender: Comer Locket Physician: Weeks in Treatment: 37 Referring Physician: Fidel Levy Education Assessment Education Provided To: Patient Education Topics Provided Wound/Skin Impairment: Handouts: Other: do not get wraps wet Methods: Demonstration, Explain/Verbal Responses: State content correctly Electronic Signature(s) Signed: 10/24/2015 4:14:54 PM By: Alejandro Mulling Entered By: Alejandro Mulling on 10/18/2015 08:52:42 [name], [name] ([id number]) -------------------------------------------------------------------------------- Wound Assessment Details Patient Name: Gaccione, Sakari H. Date of Service: 10/18/2015  8:00 AM Medical Record Patient Account Number: [id number] Number: Treating RN: Phillis Haggis 1955-09-28 (59 y.o. Other Clinician: Date of Birth/Sex: Female) Treating ROBSON, MICHAEL Primary Care Physician/Extender: Comer Locket Physician: Referring Physician: Fidel Levy Weeks in Treatment: 37 Wound Status Wound Number: 3 Primary Etiology: Venous Leg Ulcer Wound Location: Left Lower Leg - Lateral Wound Status: Open Wounding Event: Gradually Appeared Comorbid History: Hypertension, Type II Diabetes Date Acquired: 01/09/2015 Weeks Of Treatment: 37 Clustered Wound: No Photos Photo Uploaded By: Alejandro Mulling on 10/18/2015 15:49:47 Wound Measurements Length: (cm) 0.1 Width: (cm) 0.1 Depth: (cm) 0.1 Area: (cm) 0.008 Volume: (cm) 0.001 % Reduction in Area: 99.9% % Reduction in Volume: 99.9% Epithelialization: Small (1-33%) Tunneling: No Undermining: No Wound Description Full Thickness Without Exposed Foul Odor Classification: Support Structures Diabetic Severity Grade 1 (Wagner): Wound Margin: Flat and Intact Exudate Amount: Large Exudate Type: Serosanguineous Exudate Color: red, brown Sheckler, [name] ([id number]) After Cleansing: No Wound Bed Granulation Amount: None Present (0%) Exposed Structure Necrotic Amount: Large (67-100%) Fascia Exposed: No Necrotic Quality: Eschar, Adherent Slough Fat Layer Exposed: No Tendon Exposed: No Muscle Exposed: No Joint Exposed: No Bone Exposed: No Limited to Skin Breakdown Periwound Skin Texture Texture Color No Abnormalities Noted: No No Abnormalities Noted: No Callus: No Atrophie Blanche: No Crepitus: No Cyanosis: No Excoriation: No Ecchymosis: No Fluctuance: No Erythema: No Friable: No Hemosiderin Staining: No Induration: No Mottled: No Localized Edema: No Pallor: No Rash: No Rubor: No Scarring: Yes Temperature / Pain Moisture Temperature: No Abnormality No Abnormalities  Noted: No Tenderness on Palpation: Yes Dry / Scaly: No Maceration: No Moist: Yes Wound Preparation Ulcer Cleansing: Other: soap and water, Topical Anesthetic Applied: Other: lidocaine 4%, Treatment Notes Wound #3 (Left, Lateral Lower Leg) 1. Cleansed with: Cleanse wound with antibacterial soap and water 2. Anesthetic Topical Lidocaine 4% cream to wound bed prior to debridement 4. Dressing Applied: Aquacel Ag 5. Secondary Dressing Applied Dry Gauze 7. Secured with Adult nurse  Boots Bilaterally Bearse, MARIALENA WOLLEN ([id number]) Electronic Signature(s) Signed: 10/24/2015 4:14:54 PM By: Alejandro Mulling Entered By: Alejandro Mulling on 10/18/2015 08:24:09 [name], [name] ([id number]) -------------------------------------------------------------------------------- Wound Assessment Details Patient Name: [name], [name] H. Date of Service: 10/18/2015 8:00 AM Medical Record Patient Account Number: [id number] Number: Treating RN: Phillis Haggis [birth year] (59 y.o. Other Clinician: Date of Birth/Sex: Female) Treating ROBSON, MICHAEL Primary Care Physician/Extender: Comer Locket Physician: Referring Physician: Fidel Levy Weeks in Treatment: 37 Wound Status Wound Number: 8 Primary Etiology: Venous Leg Ulcer Wound Location: Right Lower Leg - Posterior Wound Status: Open Wounding Event: Gradually Appeared Comorbid History: Hypertension, Type II Diabetes Date Acquired: 03/27/2015 Weeks Of Treatment: 28 Clustered Wound: No Photos Photo Uploaded By: Alejandro Mulling on 10/18/2015 15:49:47 Wound Measurements Length: (cm) 0.1 Width: (cm) 0.1 Depth: (cm) 0.1 Area: (cm) 0.008 Volume: (cm) 0.001 % Reduction in Area: 99.5% % Reduction in Volume: 99.4% Epithelialization: None Tunneling: No Undermining: No Wound Description Full Thickness Without Exposed Foul Odor A Classification: Support Structures Diabetic Severity Grade 1 (Wagner): Wound  Margin: Flat and Intact Exudate Amount: Large Exudate Type: Serosanguineous Exudate Color: red, brown Boomershine, Forever H. ([id number]) fter Cleansing: No Wound Bed Granulation Amount: Medium (34-66%) Exposed Structure Granulation Quality: Red, Hyper-granulation Fascia Exposed: No Necrotic Amount: Medium (34-66%) Fat Layer Exposed: No Necrotic Quality: Eschar, Adherent Slough Tendon Exposed: No Muscle Exposed: No Joint Exposed: No Bone Exposed: No Limited to Skin Breakdown Periwound Skin Texture Texture Color No Abnormalities Noted: No No Abnormalities Noted: No Callus: No Atrophie Blanche: No Crepitus: No Cyanosis: No Excoriation: No Ecchymosis: No Fluctuance: No Erythema: No Friable: No Hemosiderin Staining: No Induration: No Mottled: No Localized Edema: Yes Pallor: No Rash: No Rubor: No Scarring: No Temperature / Pain Moisture Temperature: No Abnormality No Abnormalities Noted: No Tenderness on Palpation: Yes Dry / Scaly: No Maceration: No Moist: Yes Wound Preparation Ulcer Cleansing: Other: soap and water, Topical Anesthetic Applied: Other: lidocaine 4%, Treatment Notes Wound #8 (Right, Posterior Lower Leg) 1. Cleansed with: Cleanse wound with antibacterial soap and water 2. Anesthetic Topical Lidocaine 4% cream to wound bed prior to debridement 4. Dressing Applied: Aquacel Ag 5. Secondary Dressing Applied Dry Gauze 7. Secured with [employer] Bilaterally AMMA, CREAR ([id number]) Electronic Signature(s) Signed: 10/24/2015 4:14:54 PM By: Alejandro Mulling Entered By: Alejandro Mulling on 10/18/2015 08:24:40 Hyun, [name] ([id number]) -------------------------------------------------------------------------------- Vitals Details Patient Name: Nofsinger, Jessly H. Date of Service: 10/18/2015 8:00 AM Medical Record Patient Account Number: [id number] Number: Treating RN: Phillis Haggis 1955-07-16 (59 y.o. Other  Clinician: Date of Birth/Sex: Female) Treating ROBSON, MICHAEL Primary Care Physician/Extender: Comer Locket Physician: Referring Physician: Jones Broom in Treatment: 37 Vital Signs Time Taken: 08:08 Temperature (F): 97.7 Height (in): 68 Pulse (bpm): 77 Weight (lbs): 294 Respiratory Rate (breaths/min): 20 Body Mass Index (BMI): 44.7 Blood Pressure (mmHg): 151/82 Reference Range: 80 - 120 mg / dl Electronic Signature(s) Signed: 10/24/2015 4:14:54 PM By: Alejandro Mulling Entered By: Alejandro Mulling on 10/18/2015 08:09:47

## 2015-10-24 NOTE — Progress Notes (Signed)
VENETTA, KNEE ([id number]) Visit Report for 10/18/2015 Chief Complaint Document Details Patient Name: [name], [name]. Date of Service: 10/18/2015 8:00 AM Medical Record Patient Account Number: [id number] Number: Treating RN: Phillis Haggis 1956-05-06 (60 y.o. Other Clinician: Date of Birth/Sex: Female) Treating ROBSON, MICHAEL Primary Care Physician/Extender: Comer Locket Physician: Referring Physician: Jones Broom in Treatment: 37 Information Obtained from: Patient Chief Complaint Chronic bilateral calf ulcers. Electronic Signature(s) Signed: 10/18/2015 4:30:01 PM By: Baltazar Najjar MD Entered By: Baltazar Najjar on 10/18/2015 08:43:10 Astorga, [name] ([id number]) -------------------------------------------------------------------------------- Debridement Details Patient Name: [name], [name] Date of Service: 10/18/2015 8:00 AM Medical Record Patient Account Number: [id number] Number: Treating RN: Phillis Haggis 1955-12-04 (60 y.o. Other Clinician: Date of Birth/Sex: Female) Treating ROBSON, MICHAEL Primary Care Physician/Extender: Comer Locket Physician: Referring Physician: Jones Broom in Treatment: 37 Debridement Performed for Wound #3 Left,Lateral Lower Leg Assessment: Performed By: Physician Maxwell Caul, MD Debridement: Debridement Pre-procedure Yes Verification/Time Out Taken: Start Time: 08:34 Pain Control: Other : lidocaine 4% cream Level: Skin/Subcutaneous Tissue Total Area Debrided (L x 0.1 (cm) x 0.1 (cm) = 0.01 (cm) W): Tissue and other Viable, Non-Viable, Exudate, Fibrin/Slough, Subcutaneous material debrided: Instrument: Curette Bleeding: Minimum Hemostasis Achieved: Pressure End Time: 08:36 Procedural Pain: 0 Post Procedural Pain: 0 Response to Treatment: Procedure was tolerated well Post Debridement Measurements of Total Wound Length: (cm) 0.2 Width: (cm)  0.2 Depth: (cm) 0.1 Volume: (cm) 0.003 Post Procedure Diagnosis Same as Pre-procedure Electronic Signature(s) Signed: 10/18/2015 4:30:01 PM By: Baltazar Najjar MD Signed: 10/24/2015 4:14:54 PM By: Alejandro Mulling Entered By: Baltazar Najjar on 10/18/2015 08:42:35 Regal, [name] ([id number]) Flanigan, Blessyn H. ([id number]) -------------------------------------------------------------------------------- Debridement Details Patient Name: [name], [name] Date of Service: 10/18/2015 8:00 AM Medical Record Patient Account Number: [id number] Number: Treating RN: Phillis Haggis 1955/11/19 (60 y.o. Other Clinician: Date of Birth/Sex: Female) Treating ROBSON, MICHAEL Primary Care Physician/Extender: Comer Locket Physician: Referring Physician: Jones Broom in Treatment: 37 Debridement Performed for Wound #8 Right,Posterior Lower Leg Assessment: Performed By: Physician Maxwell Caul, MD Debridement: Debridement Pre-procedure Yes Verification/Time Out Taken: Start Time: 08:36 Pain Control: Other : lidocaine 4% cream Level: Skin/Subcutaneous Tissue Total Area Debrided (L x 0.1 (cm) x 0.1 (cm) = 0.01 (cm) W): Tissue and other Viable, Non-Viable, Exudate, Fibrin/Slough, Subcutaneous material debrided: Instrument: Curette Bleeding: Minimum Hemostasis Achieved: Pressure End Time: 08:36 Procedural Pain: 0 Post Procedural Pain: 0 Response to Treatment: Procedure was tolerated well Post Debridement Measurements of Total Wound Length: (cm) 0.3 Width: (cm) 0.3 Depth: (cm) 0.1 Volume: (cm) 0.007 Post Procedure Diagnosis Same as Pre-procedure Electronic Signature(s) Signed: 10/18/2015 4:30:01 PM By: Baltazar Najjar MD Signed: 10/24/2015 4:14:54 PM By: Alejandro Mulling Entered By: Baltazar Najjar on 10/18/2015 08:42:55 Edberg, [name] ([id number]) Newmark, Sonia H.  ([id number]) -------------------------------------------------------------------------------- HPI Details Patient Name: [name], [name] Date of Service: 10/18/2015 8:00 AM Medical Record Patient Account Number: [id number] Number: Treating RN: Phillis Haggis 01/08/1956 (60 y.o. Other Clinician: Date of Birth/Sex: Female) Treating ROBSON, MICHAEL Primary Care Physician/Extender: Comer Locket Physician: Referring Physician: Jones Broom in Treatment: 37 History of Present Illness HPI Description: Pleasant 60 year old with h/o DM (Hgb A1c 7.4 in Aug 2016), ESRD (on hemodialysis since July 2016). Presented to her PCP, Dr. Venora Maples, with BLE ulcers since June 2016. 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  Left lower extremity ultrasound in May 2016 showed no evidence for DVT. No assessment for venous insufficiency. Culture 04/19/2015 grew methicillin sensitive staph aureus (sensitive to tetracycline), Stenotrophomonas maltophilia, and Enterococcus faecalis. Completed course of doxycycline. Hospitalized in Nov 2016 for worsening cellulitis. Treated with IV antibiotics. No operative debridement or biopsy. Punch biopsy of left calf ulcer 06/28/2015 showed no evidence for malignancy. Bilateral lower extremity venous ultrasound 07/20/2015 showed no evidence for DVT or chronic venous insufficiency. Performing dressing changes with silver alginate. Tolerating Profore light bilaterally 3x/week. She returns to clinic and is without complaints. No significant pain. No fever or chills. Less drainage. 08/09/2015 -- details over the last several months noted. Continues to stay hemodialysis 3 times a week. Says she is improving slowly and the pain is much better 08/16/15; this patient has wounds on her bilateral lower extremities which is been present  since June 2016. The exact etiology of this is not really clear although she was hospitalized in October for cellulitis. I'm not sure if this was felt to be bilateral. In any case she had a debridement last week. She is using Aquacel Ag. Per the nursing staff the wounds continue to do nicely 08/22/15; patient's wounds were covered in a surface eschar with surrounding slough . All of this underwent a surgical debridement including slough over the actual wound bed removing subcutaneous tissue. The wounds on the right lateral leg and right lateral leg all have improved quite nicely. She has been using Aquacel Ag 08/29/15 patient has bilateral leg wounds which are probably largely venous insufficiency. She does not have infection. All the wounds underwent surgical debridement with a curette. This was to remove fibrinous surface slough and circumferential callus and nonviable subcutaneous tissue and skin. She tolerates this quite well. I changed her to Iodoflex last week. Dress[facility] are [facility] 09/06/15; her wounds continued to improve both in appearance and in volume. We had reduced her from a Profore to a Profore light last week due to blistering on her skin. The blistering is better but her edema control is not so good. She still has itchy excoriated scan which makes me wonder whether she might be reacting to the surface layer of the Profore wraps. Her wounds required surgical debridement but again both of them look better. MARLISE, FAHR ([id number]) 09/12/15 continued improvement in the appearance of the wounds undergoing a surgical debridement of circumferential callus, fibrinous surface slough and nonviable subcutaneous tissue. Her skin is a lot better leading me to believe she had some form of contact dermatitis with the skin contact layer of both the Profore in Profore light wraps. 09/19/15 the patient's wounds which are predominantly on the left anterior lateral leg  are improved no debridement was required. On the right anterior lateral leg she has a very small open wound with some depth and a superficial wound posterior to that. We have been using Iodoflex 09/26/15; the patient continues to make good improvement in her wound care. Surgical debridement done to remove surface slough callus and nonviable subcutaneous tissue although this looks considerably better. 2 small open areas on the lateral aspect of the left leg have a healthy granulated base. She has a pinpoint area on the right. We have been using Iodoflex on the left and Iodosorb on the right 10/03/15 I filled out a pinpoint wound on the left leg. One of the areas on the right leg is healed as well. The other area on the right leg is small but  very dry. Surprisingly the superficial area on the left leg posterior to the pinpoint open area is the one area that has not progressed towards healing 10/10/15 the patient has a small open area on the left leg after debridement surface eschar and nonviable subcutaneous tissue. Surprisingly the most substantial remaining open area is on the right lateral leg. We changed to [facility] last week because of the dry wound surface 10/18/15; small open area on the left leg after debridement of surface eschar and nonviable subcutaneous tissue. Right leg wound appears to come down in size from last week but also required debridement. There appears to be some surface drainage coming out of the wound. Electronic Signature(s) Signed: 10/18/2015 4:30:01 PM By: Baltazar Najjar MD Entered By: Baltazar Najjar on 10/18/2015 08:44:15 Louischarles, [name] ([id number]) -------------------------------------------------------------------------------- Physical Exam Details Patient Name: Asato, Alysha H. Date of Service: 10/18/2015 8:00 AM Medical Record Patient Account Number: [id number] Number: Treating RN: Phillis Haggis 1956/03/24 (59 y.o. Other  Clinician: Date of Birth/Sex: Female) Treating ROBSON, MICHAEL Primary Care Physician/Extender: Comer Locket Physician: Referring Physician: Jones Broom in Treatment: 37 Notes Wound exam; no evidence of infection. Both areas have surface eschar and nonviable subcutaneous tissue removed. This week the area on the right is smaller the area on the left appears to be larger. Inferior wound on the left has a divot however all of this appears to be epithelialized. Electronic Signature(s) Signed: 10/18/2015 4:30:01 PM By: Baltazar Najjar MD Entered By: Baltazar Najjar on 10/18/2015 08:45:12 Westberg, [name] ([id number]) -------------------------------------------------------------------------------- Physician Orders Details Patient Name: Music, Fizza H. Date of Service: 10/18/2015 8:00 AM Medical Record Patient Account Number: [id number] Number: Treating RN: Phillis Haggis [birth year] (60 y.o. Other Clinician: Date of Birth/Sex: Female) Treating ROBSON, MICHAEL Primary Care Physician/Extender: Comer Locket Physician: Referring Physician: Jones Broom in Treatment: 51 Verbal / Phone Orders: Yes Clinician: Ashok Cordia, Debi Read Back and Verified: Yes Diagnosis Coding Wound Cleansing Wound #3 Left,Lateral Lower Leg o Clean wound with Normal Saline. o Cleanse wound with mild soap and water o May Shower, gently pat wound dry prior to applying new dressing. Wound #8 Right,Posterior Lower Leg o Clean wound with Normal Saline. o Cleanse wound with mild soap and water o May Shower, gently pat wound dry prior to applying new dressing. Anesthetic Wound #3 Left,Lateral Lower Leg o Topical Lidocaine 4% cream applied to wound bed prior to debridement Wound #8 Right,Posterior Lower Leg o Topical Lidocaine 4% cream applied to wound bed prior to debridement Skin Barriers/Peri-Wound Care Wound #3 Left,Lateral Lower Leg o  Barrier cream o Moisturizing lotion Wound #8 Right,Posterior Lower Leg o Moisturizing lotion Primary Wound Dressing Wound #3 Left,Lateral Lower Leg o Aquacel Ag Wound #8 Right,Posterior Lower Leg o Aquacel Ag Mcaleer, Lawana H. ([id number]) Secondary Dressing Wound #3 Left,Lateral Lower Leg o Dry Gauze Wound #8 Right,Posterior Lower Leg o Dry Gauze Dressing Change Frequency Wound #3 Left,Lateral Lower Leg o Change dressing every week Wound #8 Right,Posterior Lower Leg o Change dressing every week Follow-up Appointments Wound #3 Left,Lateral Lower Leg o Return Appointment in 1 week. Wound #8 Right,Posterior Lower Leg o Return Appointment in 1 week. Edema Control Wound #3 Left,Lateral Lower Leg o Unna Boots Bilaterally o Elevate legs to the level of the heart and pump ankles as often as possible - Elevate legs at Dialysis Wound #8 Right,Posterior Lower Leg o Unna Boots Bilaterally o Elevate legs to the level of the heart and  pump ankles as often as possible - Elevate legs at Dialysis Off-Loading Wound #3 Left,Lateral Lower Leg o Turn and reposition every 2 hours Wound #8 Right,Posterior Lower Leg o Turn and reposition every 2 hours Additional Orders / Instructions Wound #3 Left,Lateral Lower Leg o Activity as tolerated Wound #8 Right,Posterior Lower Leg o Activity as tolerated Tobler, Ranette HMarland Kitchen ([id number]) Electronic Signature(s) Signed: 10/18/2015 4:30:01 PM By: Baltazar Najjar MD Signed: 10/24/2015 4:14:54 PM By: Alejandro Mulling Entered By: Alejandro Mulling on 10/18/2015 08:39:32 Dufresne, Savanha H. ([id number]) -------------------------------------------------------------------------------- Problem List Details Patient Name: Card, [name] Date of Service: 10/18/2015 8:00 AM Medical Record Patient Account Number: [id number] Number: Treating RN: Phillis Haggis [birth year] (60 y.o. 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 Date of Service: 10/18/2015 8:00 AM Medical Record Patient Account Number: [id number] Number: Treating RN: Phillis Haggis 01/27/1956 (60 y.o. Other Clinician: Date of Birth/Sex: Female) Treating ROBSON, MICHAEL Primary Care Physician/Extender: Comer Locket Physician: Referring Physician: Jones Broom in Treatment: 37 Subjective Chief Complaint Information obtained from Patient Chronic bilateral calf ulcers. History of Present Illness (HPI) Pleasant 60 year old with h/o DM (Hgb A1c 7.4 in Aug 2016), ESRD (on hemodialysis since July 2016). Presented to her PCP, Dr. Venora Maples, with BLE ulcers since June 2016. 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 Left lower extremity ultrasound in May 2016 showed no evidence for DVT. No assessment for venous insufficiency. Culture 04/19/2015 grew  methicillin sensitive staph aureus (sensitive to tetracycline), Stenotrophomonas maltophilia, and Enterococcus faecalis. Completed course of doxycycline. Hospitalized in Nov 2016 for worsening cellulitis. Treated with IV antibiotics. No operative debridement or biopsy. Punch biopsy of left calf ulcer 06/28/2015 showed no evidence for malignancy. Bilateral lower extremity venous ultrasound 07/20/2015 showed no evidence for DVT or chronic venous insufficiency. Performing dressing changes with silver alginate. Tolerating Profore light bilaterally 3x/week. She returns to clinic and is without complaints. No significant pain. No fever or chills. Less drainage. 08/09/2015 -- details over the last several months noted. Continues to stay hemodialysis 3 times a week. Says she is improving slowly and the pain is much better 08/16/15; this patient has wounds on her bilateral lower extremities which is been present since June 2016. The exact etiology of this is not really clear although she was hospitalized in October for cellulitis. I'm not sure if this was felt to be bilateral. In any case she had a debridement last week. She is using Aquacel Ag. Per the nursing staff the wounds continue to do nicely 08/22/15; patient's wounds were covered in a surface eschar with surrounding slough . All of this underwent a surgical debridement including slough over the actual wound bed removing subcutaneous tissue. The wounds on the right  lateral leg and right lateral leg all have improved quite nicely. She has been using Aquacel Ag 08/29/15 patient has bilateral leg wounds which are probably largely venous insufficiency. She does not have infection. All the wounds underwent surgical debridement with a curette. This was to remove fibrinous surface slough and circumferential callus and nonviable subcutaneous tissue and skin. She tolerates this quite well. I changed her to Iodoflex last week. Dress[facility] are being [facility] Wilcock, Genita H. ([id number]) 09/06/15; her wounds continued to improve both in appearance and in volume. We had reduced her from a Profore to a Profore light last week due to blistering on her skin. The blistering is better but her edema control is not so good. She still has itchy excoriated scan which makes me wonder whether she might be reacting to the surface layer of the Profore wraps. Her wounds required surgical debridement but again both of them look better. 09/12/15 continued improvement in the appearance of the wounds undergoing a surgical debridement of circumferential callus, fibrinous surface slough and nonviable subcutaneous tissue. Her skin is a lot better leading me to believe she had some form of contact dermatitis with the skin contact layer of both the Profore in Profore light wraps. 09/19/15 the patient's wounds which are predominantly on the left anterior lateral leg are improved no debridement was required. On the right anterior lateral leg she has a very small open wound with some depth and a superficial wound posterior to that. We have been using Iodoflex 09/26/15; the patient continues to make good improvement in her wound care. Surgical debridement done to remove surface slough callus and nonviable subcutaneous tissue although this looks considerably better. 2 small open areas on the lateral aspect of the left leg have a healthy granulated base. She has a pinpoint area on the right. We have been using Iodoflex on the left and Iodosorb on the right 10/03/15 I filled out a pinpoint wound on the left leg. One of the areas on the right leg is healed as well. The other area on the right leg is small but very dry. Surprisingly the superficial area on the left leg posterior to the pinpoint open area is the one area that has not progressed towards healing 10/10/15 the patient has a small open area on the left leg after debridement surface eschar and  nonviable subcutaneous tissue. Surprisingly the most substantial remaining open area is on the right lateral leg. We changed to [facility] last week because of the dry wound surface 10/18/15; small open area on the left leg after debridement of surface eschar and nonviable subcutaneous tissue. Right leg wound appears to come down in size from last week but also required debridement. There appears to be some surface drainage coming out of the wound. Objective Constitutional Vitals Time Taken: 8:08 AM, Height: 68 in, Weight: 294 lbs, BMI: 44.7, Temperature: 97.7 F, Pulse: 77 bpm, Respiratory Rate: 20 breaths/min, Blood Pressure: 151/82 mmHg. Integumentary (Hair, Skin) Wound #3 status is Open. Original cause of wound was Gradually Appeared. The wound is located on the Left,Lateral Lower Leg. The wound measures 0.1cm length x 0.1cm width x 0.1cm depth; 0.008cm^2 area and 0.001cm^3 volume. The wound is limited to skin breakdown. There is no tunneling or undermining noted. There is a large amount of serosanguineous drainage noted. The wound margin is flat and intact. There is no granulation within the wound bed. There is a large (67-100%) amount of necrotic tissue within the wound bed  including Eschar and Adherent Slough. The periwound skin appearance exhibited: Scarring, Moist. The periwound skin appearance did not exhibit: Callus, Crepitus, Excoriation, Fluctuance, Friable, Induration, Localized Edema, Rash, Dry/Scaly, Maceration, Atrophie Blanche, Cyanosis, Ecchymosis, Hemosiderin Staining, Mottled, Pallor, Rubor, Erythema. Periwound temperature was noted as No Abnormality. The periwound has tenderness on palpation. Chaisson, Olena H. ([id number]) Wound #8 status is Open. Original cause of wound was Gradually Appeared. The wound is located on the Right,Posterior Lower Leg. The wound measures 0.1cm length x 0.1cm width x 0.1cm depth; 0.008cm^2 area and 0.001cm^3 volume. The wound is  limited to skin breakdown. There is no tunneling or undermining noted. There is a large amount of serosanguineous drainage noted. The wound margin is flat and intact. There is medium (34-66%) red granulation within the wound bed. There is a medium (34-66%) amount of necrotic tissue within the wound bed including Eschar and Adherent Slough. The periwound skin appearance exhibited: Localized Edema, Moist. The periwound skin appearance did not exhibit: Callus, Crepitus, Excoriation, Fluctuance, Friable, Induration, Rash, Scarring, Dry/Scaly, Maceration, Atrophie Blanche, Cyanosis, Ecchymosis, Hemosiderin Staining, Mottled, Pallor, Rubor, Erythema. Periwound temperature was noted as No Abnormality. The periwound has tenderness on palpation. Assessment Active Problems ICD-10 E11.622 - Type 2 diabetes mellitus with other skin ulcer N18.6 - End stage renal disease L97.222 - Non-pressure chronic ulcer of left calf with fat layer exposed L97.212 - Non-pressure chronic ulcer of right calf with fat layer exposed R60.0 - Localized edema I89.0 - Lymphedema, not elsewhere classified Procedures Wound #3 Wound #3 is a Venous Leg Ulcer located on the Left,Lateral Lower Leg . There was a Skin/Subcutaneous Tissue Debridement ([id number]) debridement with total area of 0.01 sq cm performed by Maxwell CaulOBSON, MICHAEL G, MD. with the following instrument(s): Curette to remove Viable and Non-Viable tissue/material including Exudate, Fibrin/Slough, and Subcutaneous after achieving pain control using Other (lidocaine 4% cream). A time out was conducted prior to the start of the procedure. A Minimum amount of bleeding was controlled with Pressure. The procedure was tolerated well with a pain level of 0 throughout and a pain level of 0 following the procedure. Post Debridement Measurements: 0.2cm length x 0.2cm width x 0.1cm depth; 0.003cm^3 volume. Post procedure Diagnosis Wound #3: Same as Pre-Procedure Wound #8 Wound  #8 is a Venous Leg Ulcer located on the Right,Posterior Lower Leg . There was a Skin/Subcutaneous Tissue Debridement ([id number]) debridement with total area of 0.01 sq cm performed by Maxwell CaulOBSON, MICHAEL G, MD. with the following instrument(s): Curette to remove Viable and Non-Viable tissue/material including Exudate, Fibrin/Slough, and Subcutaneous after achieving pain control Berg, Adonai H. ([id number]) using Other (lidocaine 4% cream). A time out was conducted prior to the start of the procedure. A Minimum amount of bleeding was controlled with Pressure. The procedure was tolerated well with a pain level of 0 throughout and a pain level of 0 following the procedure. Post Debridement Measurements: 0.3cm length x 0.3cm width x 0.1cm depth; 0.007cm^3 volume. Post procedure Diagnosis Wound #8: Same as Pre-Procedure Plan Wound Cleansing: Wound #3 Left,Lateral Lower Leg: Clean wound with Normal Saline. Cleanse wound with mild soap and water May Shower, gently pat wound dry prior to applying new dressing. Wound #8 Right,Posterior Lower Leg: Clean wound with Normal Saline. Cleanse wound with mild soap and water May Shower, gently pat wound dry prior to applying new dressing. Anesthetic: Wound #3 Left,Lateral Lower Leg: Topical Lidocaine 4% cream applied to wound bed prior to debridement Wound #8 Right,Posterior Lower Leg: Topical Lidocaine 4% cream applied to wound bed  prior to debridement Skin Barriers/Peri-Wound Care: Wound #3 Left,Lateral Lower Leg: Barrier cream Moisturizing lotion Wound #8 Right,Posterior Lower Leg: Moisturizing lotion Primary Wound Dressing: Wound #3 Left,Lateral Lower Leg: Aquacel Ag Wound #8 Right,Posterior Lower Leg: Aquacel Ag Secondary Dressing: Wound #3 Left,Lateral Lower Leg: Dry Gauze Wound #8 Right,Posterior Lower Leg: Dry Gauze Dressing Change Frequency: Wound #3 Left,Lateral Lower Leg: Change dressing every week Wound #8 Right,Posterior Lower  Leg: Change dressing every week Follow-up Appointments: Wound #3 Left,Lateral Lower Leg: Jurewicz, Mahoganie H. ([id number]) Return Appointment in 1 week. Wound #8 Right,Posterior Lower Leg: Return Appointment in 1 week. Edema Control: Wound #3 Left,Lateral Lower Leg: Unna Boots Bilaterally Elevate legs to the level of the heart and pump ankles as often as possible - Elevate legs at Dialysis Wound #8 Right,Posterior Lower Leg: Unna Boots Bilaterally Elevate legs to the level of the heart and pump ankles as often as possible - Elevate legs at Dialysis Off-Loading: Wound #3 Left,Lateral Lower Leg: Turn and reposition every 2 hours Wound #8 Right,Posterior Lower Leg: Turn and reposition every 2 hours Additional Orders / Instructions: Wound #3 Left,Lateral Lower Leg: Activity as tolerated Wound #8 Right,Posterior Lower Leg: Activity as tolerated change to silver alginate. odrainage Unna; did not tolerate profore Electronic Signature(s) Signed: 10/18/2015 4:30:01 PM By: Baltazar Najjar MD Entered By: Baltazar Najjar on 10/18/2015 08:46:02 Schlemmer, [name] ([id number]) -------------------------------------------------------------------------------- SuperBill Details Patient Name: Cedillos, Berdina H. Date of Service: 10/18/2015 Medical Record Patient Account Number: [id number] Number: Treating RN: Phillis Haggis 1956-01-27 (60 y.o. Other Clinician: Date of Birth/Sex: Female) Treating ROBSON, MICHAEL Primary Care Physician/Extender: Comer Locket Physician: Weeks in Treatment: 37 Referring Physician: Fidel Levy Diagnosis Coding ICD-10 Codes Code Description [phone] Type 2 diabetes mellitus with other skin ulcer N18.6 End stage renal disease L97.222 Non-pressure chronic ulcer of left calf with fat layer exposed L97.212 Non-pressure chronic ulcer of right calf with fat layer exposed R60.0 Localized edema I89.0 Lymphedema, not elsewhere  classified Facility Procedures CPT4 Code: 21308657 Description: 11042 - DEB SUBQ TISSUE 20 SQ CM/< ICD-10 Description Diagnosis L97.222 Non-pressure chronic ulcer of left calf with fat l Modifier: ayer exposed Quantity: 1 Physician Procedures CPT4 Code: 8469629 Description: 11042 - WC PHYS SUBQ TISS 20 SQ CM ICD-10 Description Diagnosis L97.222 Non-pressure chronic ulcer of left calf with fat l Modifier: ayer exposed Quantity: 1 Electronic Signature(s) Signed: 10/18/2015 4:30:01 PM By: Baltazar Najjar MD Entered By: Baltazar Najjar on 10/18/2015 08:46:29

## 2015-10-25 ENCOUNTER — Encounter: Payer: BC Managed Care – PPO | Admitting: Internal Medicine

## 2015-10-25 DIAGNOSIS — E11628 Type 2 diabetes mellitus with other skin complications: Secondary | ICD-10-CM | POA: Diagnosis not present

## 2015-10-25 DIAGNOSIS — Z872 Personal history of diseases of the skin and subcutaneous tissue: Secondary | ICD-10-CM | POA: Diagnosis not present

## 2015-10-25 DIAGNOSIS — E11622 Type 2 diabetes mellitus with other skin ulcer: Secondary | ICD-10-CM | POA: Diagnosis not present

## 2015-10-25 DIAGNOSIS — Z09 Encounter for follow-up examination after completed treatment for conditions other than malignant neoplasm: Secondary | ICD-10-CM | POA: Diagnosis not present

## 2015-10-26 NOTE — Progress Notes (Signed)
[name] [name] ([id number]) Visit Report for 10/25/2015 Chief Complaint Document Details Patient Name: [name], [name]. Date of Service: 10/25/2015 8:00 AM Medical Record Patient Account Number: [id number] Number: Treating RN: Phillis [name] 09/30/1955 (60 y.o. Other Clinician: Date of Birth/Sex: Female) Treating [name] [name] Primary Care Physician/Extender: [name] [name] Physician: Referring Physician: Jones [name] in Treatment: 58 Information Obtained from: Patient Chief Complaint Chronic bilateral calf ulcers. Electronic Signature(s) Signed: 10/25/2015 4:27:19 PM By: [name] Najjar MD Entered By: [name] [name] on 10/25/2015 08:37:38 [name] [name] ([id number]) -------------------------------------------------------------------------------- HPI Details Patient Name: [name] [name] H. Date of Service: 10/25/2015 8:00 AM Medical Record Patient Account Number: [id number] Number: Treating RN: Phillis [name] 01/11/1956 (60 y.o. Other Clinician: Date of Birth/Sex: Female) Treating [name] [name] Primary Care Physician/Extender: [name] [name] Physician: Referring Physician: Jones [name] in Treatment: 13 History of Present Illness HPI Description: Pleasant 60 year old with h/o DM (Hgb A1c 7.4 in Aug 2016), ESRD (on hemodialysis since July 2016). Presented to her PCP, [name] [name], with BLE ulcers since June 2016. 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 Left lower extremity ultrasound in May 2016 showed no evidence for DVT. No assessment for venous insufficiency. Culture 04/19/2015 grew methicillin sensitive staph aureus (sensitive to tetracycline), Stenotrophomonas maltophilia, and Enterococcus faecalis. Completed course of doxycycline. Hospitalized in Nov 2016 for worsening  cellulitis. Treated with IV antibiotics. No operative debridement or biopsy. Punch biopsy of left calf ulcer 06/28/2015 showed no evidence for malignancy. Bilateral lower extremity venous ultrasound 07/20/2015 showed no evidence for DVT or chronic venous insufficiency. Performing dressing changes with silver alginate. Tolerating Profore light bilaterally 3x/week. She returns to clinic and is without complaints. No significant pain. No fever or chills. Less drainage. 08/09/2015 -- details over the last several months noted. Continues to stay hemodialysis 3 times a week. Says she is improving slowly and the pain is much better 08/16/15; this patient has wounds on her bilateral lower extremities which is been present since June 2016. The exact etiology of this is not really clear although she was hospitalized in October for cellulitis. I'm not sure if this was felt to be bilateral. In any case she had a debridement last week. She is using Aquacel Ag. Per the nursing staff the wounds continue to do nicely 08/22/15; patient's wounds were covered in a surface eschar with surrounding slough . All of this underwent a surgical debridement including slough over the actual wound bed removing subcutaneous tissue. The wounds on the right lateral leg and right lateral leg all have improved quite nicely. She has been using Aquacel Ag 08/29/15 patient has bilateral leg wounds which are probably largely venous insufficiency. She does not have infection. All the wounds underwent surgical debridement with a curette. This was to remove fibrinous surface slough and circumferential callus and nonviable subcutaneous tissue and skin. She tolerates this quite well. I changed her to Iodoflex last week. Dressings [facility] 09/06/15; her wounds continued to improve both in appearance and in volume. We had reduced her from a Profore to a Profore light last week due to blistering on her skin. The  blistering is better but her edema control is not so good. She still has itchy excoriated scan which makes me wonder whether she might be reacting to the surface layer of the Profore wraps. Her wounds required surgical debridement but again both  of them look better. [name] PomfretMCDONALD, [name] H. ([id number]) 09/12/15 continued improvement in the appearance of the wounds undergoing a surgical debridement of circumferential callus, fibrinous surface slough and nonviable subcutaneous tissue. Her skin is a lot better leading me to believe she had some form of contact dermatitis with the skin contact layer of both the Profore in Profore light wraps. 09/19/15 the patient's wounds which are predominantly on the left anterior lateral leg are improved no debridement was required. On the right anterior lateral leg she has a very small open wound with some depth and a superficial wound posterior to that. We have been using Iodoflex 09/26/15; the patient continues to make good improvement in her wound care. Surgical debridement done to remove surface slough callus and nonviable subcutaneous tissue although this looks considerably better. 2 small open areas on the lateral aspect of the left leg have a healthy granulated base. She has a pinpoint area on the right. We have been using Iodoflex on the left and Iodosorb on the right 10/03/15 I filled out a pinpoint wound on the left leg. One of the areas on the right leg is healed as well. The other area on the right leg is small but very dry. Surprisingly the superficial area on the left leg posterior to the pinpoint open area is the one area that has not progressed towards healing 10/10/15 the patient has a small open area on the left leg after debridement surface eschar and nonviable subcutaneous tissue. Surprisingly the most substantial remaining open area is on the right lateral leg. [facility] week because of the dry wound surface 10/18/15;  small open area on the left leg after debridement of surface eschar and nonviable subcutaneous tissue. Right leg wound appears to come down in size from last week but also required debridement. There appears to be some surface drainage coming out of the wound. 10/25/15; careful removal of surface eschar done over a small area on the right and a more substantial area on the left lateral area with careful debridement there is no open area here. The patient can be discharged today. She has her juxtalite stockings Electronic Signature(s) Signed: 10/25/2015 4:27:19 PM By: [name] Najjarobson, Michael MD Entered By: [name] Najjarobson, [name] on 10/25/2015 08:40:02 [name] [name] EdelmanPAMELA H. ([id number]) -------------------------------------------------------------------------------- Physical Exam Details Patient Name: Loman, Geanette H. Date of Service: 10/25/2015 8:00 AM Medical Record Patient Account Number: [id number] Number: Treating RN: Phillis Haggisinkerton, Debi 1956-01-22 (59 y.o. Other Clinician: Date of Birth/Sex: Female) Treating [name] [name] Primary Care Physician/Extender: [name] LocketG HAWKINS JR, JAMES Physician: Referring Physician: Jones BroomHAWKINS JR, JAMES Weeks in Treatment: 3438 Constitutional Supine Blood Pressure is within target range for patient.. Pulse regular and within target range for patient.. Temperature is normal and within the target range for the patient.. Patient's appearance is neat and clean. Appears in no acute distress. Well nourished and well developed.. Eyes Conjunctivae clear. No discharge.. Cardiovascular Pedal pulses palpable and strong bilaterally.. Edema present in both extremities. Edema control is adequate. Lymphatic No lymph nodes in the popliteal or inguinal area. Psychiatric No evidence of depression, anxiety, or agitation. Calm, cooperative, and communicative. Appropriate interactions and affect.. Notes Wound exam; no evidence of infection. Both areas have a slight surface eschar which  I removed carefully with a curet the. There is no open area on either side. Given the extent of the eschar on the left I was somewhat surprised by this finding nevertheless I see nothing but healthy epithelium Electronic Signature(s) Signed: 10/25/2015 4:27:19 PM  By: [name] Najjar MD Entered By: [name] [name] on 10/25/2015 08:46:46 [name] [name] ([id number]) -------------------------------------------------------------------------------- Physician Orders Details Patient Name: [name] [name] H. Date of Service: 10/25/2015 8:00 AM Medical Record Patient Account Number: [id number] Number: Treating RN: Phillis [name] [birth year] (59 y.o. Other Clinician: Date of Birth/Sex: Female) Treating [name] [name] Primary Care Physician/Extender: [name] [name] Physician: Referring Physician: Jones [name] in Treatment: 32 Verbal / Phone Orders: Yes Clinician: Ashok Cordia, Debi Read Back and Verified: Yes Diagnosis Coding Discharge From [facility] o Discharge from [facility] - Moisturize legs, wear compression stockings. Call us if you need anything. Electronic Signature(s) Signed: 10/25/2015 4:27:19 PM By: [name] Najjar MD Signed: 10/25/2015 4:40:51 PM By: Alejandro Mulling Entered By: Alejandro Mulling on 10/25/2015 08:37:04 Girardot, [name] [name] ([id number]) -------------------------------------------------------------------------------- Problem List Details Patient Name: [name] [name] H. Date of Service: 10/25/2015 8:00 AM Medical Record Patient Account Number: [id number] Number: Treating RN: Phillis [name] [birth year] (59 y.o. Other Clinician: Date of Birth/Sex: Female) Treating [name] [name] Primary Care Physician/Extender: [name] [name] Physician: Referring Physician: Fidel Levy Weeks in Treatment: 103 Active Problems ICD-10 Encounter Code Description Active Date Diagnosis E11.622 Type 2 diabetes mellitus  with other skin ulcer 01/30/2015 Yes N18.6 End stage renal disease 01/30/2015 Yes R60.0 Localized edema 04/19/2015 Yes I89.0 Lymphedema, not elsewhere classified 07/12/2015 Yes Inactive Problems Resolved Problems ICD-10 Code Description Active Date Resolved Date L97.222 Non-pressure chronic ulcer of left calf with fat layer 01/30/2015 01/30/2015 exposed L97.212 Non-pressure chronic ulcer of right calf with fat layer 01/30/2015 01/30/2015 exposed Electronic Signature(s) Signed: 10/25/2015 4:27:19 PM By: [name] Najjar MD Entered By: [name] [name] on 10/25/2015 08:37:21 Storr, [name] [name] ([id number]) [name] [name] H. ([id number]) -------------------------------------------------------------------------------- Progress Note Details Patient Name: [name] [name] H. Date of Service: 10/25/2015 8:00 AM Medical Record Patient Account Number: [id number] Number: Treating RN: Phillis [name] 1956/03/02 (59 y.o. Other Clinician: Date of Birth/Sex: Female) Treating [name] [name] Primary Care Physician/Extender: [name] [name] Physician: Referring Physician: Jones [name] in Treatment: 35 Subjective Chief Complaint Information obtained from Patient Chronic bilateral calf ulcers. History of Present Illness (HPI) Pleasant 60 year old with h/o DM (Hgb A1c 7.4 in Aug 2016), ESRD (on hemodialysis since July 2016). Presented to her PCP, [name] [name], with BLE ulcers since June 2016. 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 Left lower extremity ultrasound in May 2016 showed no evidence for DVT. No assessment for venous insufficiency. Culture 04/19/2015 grew methicillin sensitive staph aureus (sensitive to tetracycline), Stenotrophomonas maltophilia, and Enterococcus faecalis. Completed course of doxycycline. Hospitalized in Nov 2016 for worsening  cellulitis. Treated with IV antibiotics. No operative debridement or biopsy. Punch biopsy of left calf ulcer 06/28/2015 showed no evidence for malignancy. Bilateral lower extremity venous ultrasound 07/20/2015 showed no evidence for DVT or chronic venous insufficiency. Performing dressing changes with silver alginate. Tolerating Profore light bilaterally 3x/week. She returns to clinic and is without complaints. No significant pain. No fever or chills. Less drainage. 08/09/2015 -- details over the last several months noted. Continues to stay hemodialysis 3 times a week. Says she is improving slowly and the pain is much better 08/16/15; this patient has wounds on her bilateral lower extremities which is been present since June 2016. The exact etiology of this is not really clear although she was hospitalized in October for cellulitis. I'm not sure if this was felt to be bilateral. In any  case she had a debridement last week. She is using Aquacel Ag. Per the nursing staff the wounds continue to do nicely 08/22/15; patient's wounds were covered in a surface eschar with surrounding slough . All of this underwent a surgical debridement including slough over the actual wound bed removing subcutaneous tissue. The wounds on the right lateral leg and right lateral leg all have improved quite nicely. She has been using Aquacel Ag 08/29/15 patient has bilateral leg wounds which are probably largely venous insufficiency. She does not have infection. All the wounds underwent surgical debridement with a curette. This was to remove fibrinous surface slough and circumferential callus and nonviable subcutaneous tissue and skin. She tolerates this quite well. I changed her to Iodoflex last week. Dress[facility] are being [facility] [name] [name] H. ([id number]) 09/06/15; her wounds continued to improve both in appearance and in volume. We had reduced her from a Profore to a Profore light last week due to  blistering on her skin. The blistering is better but her edema control is not so good. She still has itchy excoriated scan which makes me wonder whether she might be reacting to the surface layer of the Profore wraps. Her wounds required surgical debridement but again both of them look better. 09/12/15 continued improvement in the appearance of the wounds undergoing a surgical debridement of circumferential callus, fibrinous surface slough and nonviable subcutaneous tissue. Her skin is a lot better leading me to believe she had some form of contact dermatitis with the skin contact layer of both the Profore in Profore light wraps. 09/19/15 the patient's wounds which are predominantly on the left anterior lateral leg are improved no debridement was required. On the right anterior lateral leg she has a very small open wound with some depth and a superficial wound posterior to that. We have been using Iodoflex 09/26/15; the patient continues to make good improvement in her wound care. Surgical debridement done to remove surface slough callus and nonviable subcutaneous tissue although this looks considerably better. 2 small open areas on the lateral aspect of the left leg have a healthy granulated base. She has a pinpoint area on the right. We have been using Iodoflex on the left and Iodosorb on the right 10/03/15 I filled out a pinpoint wound on the left leg. One of the areas on the right leg is healed as well. The other area on the right leg is small but very dry. Surprisingly the superficial area on the left leg posterior to the pinpoint open area is the one area that has not progressed towards healing 10/10/15 the patient has a small open area on the left leg after debridement surface eschar and nonviable subcutaneous tissue. Surprisingly the most substantial remaining open area is on the right lateral leg. We changed to [facility] last week because of the dry wound surface 10/18/15; small  open area on the left leg after debridement of surface eschar and nonviable subcutaneous tissue. Right leg wound appears to come down in size from last week but also required debridement. There appears to be some surface drainage coming out of the wound. 10/25/15; careful removal of surface eschar done over a small area on the right and a more substantial area on the left lateral area with careful debridement there is no open area here. The patient can be discharged today. She has her juxtalite stockings Objective Constitutional Vitals Time Taken: 8:08 AM, Height: 68 in, Weight: 294 lbs, BMI: 44.7, Temperature: 97.9 F, Pulse:  84 bpm, Respiratory Rate: 20 breaths/min, Blood Pressure: 136/57 mmHg. Integumentary (Hair, Skin) Wound #3 status is Open. Original cause of wound was Gradually Appeared. The wound is located on the Left,Lateral Lower Leg. The wound measures 0cm length x 0cm width x 0cm depth; 0cm^2 area and 0cm^3 volume. The wound is limited to skin breakdown. There is no tunneling or undermining noted. There is a none present amount of drainage noted. The wound margin is flat and intact. There is no granulation within the wound bed. There is no necrotic tissue within the wound bed. The periwound skin appearance exhibited: Scarring. The periwound skin appearance did not exhibit: Callus, Crepitus, Excoriation, Fluctuance, Friable, Induration, Localized Edema, Rash, Dry/Scaly, Maceration, Moist, Atrophie Blanche, Cyanosis, Ecchymosis, Hemosiderin Staining, Mottled, Pallor, Rubor, Erythema. Periwound temperature [name] [name] H. ([id number]) was noted as No Abnormality. The periwound has tenderness on palpation. Wound #8 status is Open. Original cause of wound was Gradually Appeared. The wound is located on the Right,Posterior Lower Leg. The wound measures 0cm length x 0cm width x 0cm depth; 0cm^2 area and 0cm^3 volume. The wound is limited to skin breakdown. There is no tunneling or  undermining noted. There is a none present amount of drainage noted. The wound margin is flat and intact. There is no granulation within the wound bed. There is no necrotic tissue within the wound bed. The periwound skin appearance exhibited: Scarring. The periwound skin appearance did not exhibit: Callus, Crepitus, Excoriation, Fluctuance, Friable, Induration, Localized Edema, Rash, Dry/Scaly, Maceration, Moist, Atrophie Blanche, Cyanosis, Ecchymosis, Hemosiderin Staining, Mottled, Pallor, Rubor, Erythema. Periwound temperature was noted as No Abnormality. The periwound has tenderness on palpation. Assessment Active Problems ICD-10 E11.622 - Type 2 diabetes mellitus with other skin ulcer N18.6 - End stage renal disease R60.0 - Localized edema I89.0 - Lymphedema, not elsewhere classified Plan Discharge From [facility]: Discharge from Wound Care Center - Moisturize legs, wear compression stockings. Call us if you need anything. There is no open area on either leg identified. Edema control is adequate. She was shown how to apply juxta-lite stokings. advised in leg lubrication and elevtion especially at dialysis.She can be discharged Electronic Signature(s) Signed: 10/25/2015 4:27:19 PM By: [name] Najjar MD Entered By: [name] [name] on 10/25/2015 08:48:00 Brunell, [name] [name] ([id number]) Jacuinde, Jacquese Rexene Edison ([id number]) -------------------------------------------------------------------------------- SuperBill Details Patient Name: [name] [name] H. Date of Service: 10/25/2015 Medical Record Patient Account Number: [id number] Number: Treating RN: Phillis [name] [birth year] (59 y.o. Other Clinician: Date of Birth/Sex: Female) Treating [name] [name] Primary Care Physician/Extender: [name] [name] Physician: Weeks in Treatment: 38 Referring Physician: Fidel Levy Diagnosis Coding ICD-10 Codes Code Description [phone] Type 2 diabetes mellitus with other  skin ulcer N18.6 End stage renal disease R60.0 Localized edema I89.0 Lymphedema, not elsewhere classified Facility Procedures CPT4 Code: [id number] Description: [phone] - WOUND CARE VISIT-LEV 2 EST PT Modifier: Quantity: 1 Physician Procedures CPT4 Code: 4401027 Description: 99213 - WC PHYS LEVEL 3 - EST PT ICD-10 Description Diagnosis E11.622 Type 2 diabetes mellitus with other skin ulcer Modifier: Quantity: 1 Electronic Signature(s) Signed: 10/25/2015 4:27:19 PM By: [name] Najjar MD Signed: 10/25/2015 4:40:51 PM By: Alejandro Mulling Entered By: Alejandro Mulling on 10/25/2015 13:27:49

## 2015-10-26 NOTE — Progress Notes (Signed)
LAKEESHA, FONTANILLA ([id number]) Visit Report for 10/25/2015 Arrival Information Details Patient Name: [name], [name]. Date of Service: 10/25/2015 8:00 AM Medical Record Patient Account Number: [id number] Number: Treating RN: Phillis Haggis [birth year] (59 y.o. Other Clinician: Date of Birth/Sex: Female) Treating ROBSON, MICHAEL Primary Care Physician/Extender: Comer Locket Physician: Referring Physician: Jones Broom in Treatment: 92 Visit Information History Since Last Visit All ordered tests and consults were completed: No Patient Arrived: Ambulatory Added or deleted any medications: No Arrival Time: 08:08 Any new allergies or adverse reactions: No Accompanied By: self Had a fall or experienced change in No Transfer Assistance: None activities of daily living that may affect Patient Identification Verified: Yes risk of falls: Secondary Verification Process Yes Signs or symptoms of abuse/neglect since last No Completed: visito Patient Requires Transmission- No Hospitalized since last visit: No Based Precautions: Pain Present Now: No Patient Has Alerts: Yes Patient Alerts: DMII ABI [place] Bilateral Electronic Signature(s) Signed: 10/25/2015 4:40:51 PM By: Alejandro Mulling Entered By: Alejandro Mulling on 10/25/2015 08:08:25 Killgore, Christan Rexene Edison ([id number]) -------------------------------------------------------------------------------- Clinic Level of Care Assessment Details Patient Name: Diamant, Chizaram H. Date of Service: 10/25/2015 8:00 AM Medical Record Patient Account Number: [id number] Number: Treating RN: Phillis Haggis 1956/04/22 (59 y.o. Other Clinician: Date of Birth/Sex: Female) Treating ROBSON, MICHAEL Primary Care Physician/Extender: Comer Locket Physician: Referring Physician: Jones Broom in Treatment: 67 Clinic Level of Care Assessment Items TOOL 4 Quantity Score  - Use when only an  EandM is performed on FOLLOW-UP visit 0 ASSESSMENTS - Nursing Assessment / Reassessment  - Reassessment of Co-morbidities (includes updates in patient status) 0 X - Reassessment of Adherence to Treatment Plan 1 5 ASSESSMENTS - Wound and Skin Assessment / Reassessment  - Simple Wound Assessment / Reassessment - one wound 0 X - Complex Wound Assessment / Reassessment - multiple wounds 2 5  - Dermatologic / Skin Assessment (not related to wound area) 0 ASSESSMENTS - Focused Assessment  - Circumferential Edema Measurements - multi extremities 0  - Nutritional Assessment / Counseling / Intervention 0  - Lower Extremity Assessment (monofilament, tuning fork, pulses) 0  - Peripheral Arterial Disease Assessment (using hand held doppler) 0 ASSESSMENTS - Ostomy and/or Continence Assessment and Care  - Incontinence Assessment and Management 0  - Ostomy Care Assessment and Management (repouching, etc.) 0 PROCESS - Coordination of Care X - Simple Patient / Family Education for ongoing care 1 15  - Complex (extensive) Patient / Family Education for ongoing care 0  - Staff obtains Consents, Records, Test Results / Process Orders 0 Grindstaff, Malikah H. ([id number])  - Staff telephones HHA, Nursing Homes / Clarify orders / etc 0  - Routine Transfer to another Facility (non-emergent condition) 0  - Routine Hospital Admission (non-emergent condition) 0  - New Admissions / Manufacturing engineer / Ordering NPWT, Apligraf, etc. 0  - Emergency Hospital Admission (emergent condition) 0  - Simple Discharge Coordination 0 X - Complex (extensive) Discharge Coordination 1 15 PROCESS - Special Needs  - Pediatric / Minor Patient Management 0  - Isolation Patient Management 0  - Hearing / Language / Visual special needs 0  - Assessment of Community assistance (transportation, D/C planning, etc.) 0  - Additional assistance / Altered mentation 0  - Support Surface(s)  Assessment (bed, cushion, seat, etc.) 0 INTERVENTIONS - Wound Cleansing / Measurement  - Simple Wound Cleansing - one wound 0 X - Complex Wound Cleansing - multiple wounds 2 5 X -  Wound Imaging (photographs - any number of wounds) 1 5  - Wound Tracing (instead of photographs) 0  - Simple Wound Measurement - one wound 0  - Complex Wound Measurement - multiple wounds 0 INTERVENTIONS - Wound Dressings  - Small Wound Dressing one or multiple wounds 0  - Medium Wound Dressing one or multiple wounds 0  - Large Wound Dressing one or multiple wounds 0  - Application of Medications - topical 0  - Application of Medications - injection 0 Hillock, Melaine H. ([id number]) INTERVENTIONS - Miscellaneous  - External ear exam 0  - Specimen Collection (cultures, biopsies, blood, body fluids, etc.) 0  - Specimen(s) / Culture(s) sent or taken to Lab for analysis 0  - Patient Transfer (multiple staff / Michiel Sites Lift / Similar devices) 0  - Simple Staple / Suture removal (25 or less) 0  - Complex Staple / Suture removal (26 or more) 0  - Hypo / Hyperglycemic Management (close monitor of Blood Glucose) 0  - Ankle / Brachial Index (ABI) - do not check if billed separately 0 X - Vital Signs 1 5 Has the patient been seen at the hospital within the last three years: Yes Total Score: 65 Level Of Care: New/Established - Level 2 Electronic Signature(s) Signed: 10/25/2015 4:40:51 PM By: Alejandro Mulling Entered By: Alejandro Mulling on 10/25/2015 13:27:40 Turnipseed, [name] ([id number]) -------------------------------------------------------------------------------- Encounter Discharge Information Details Patient Name: Meddings, Amberle H. Date of Service: 10/25/2015 8:00 AM Medical Record Patient Account Number: [id number] Number: Treating RN: Phillis Haggis February 21, 1956 (59 y.o. Other Clinician: Date of Birth/Sex: Female) Treating ROBSON, MICHAEL Primary Care  Physician/Extender: Comer Locket Physician: Referring Physician: Jones Broom in Treatment: 14 Encounter Discharge Information Items Discharge Pain Level: 0 Discharge Condition: Stable Ambulatory Status: Ambulatory Discharge Destination: Home Transportation: Private Auto Accompanied By: self Schedule Follow-up Appointment: No Medication Reconciliation completed and provided to Patient/Care Yes Sadler Teschner: Provided on Clinical Summary of Care: 10/25/2015 Form Type Recipient Paper Patient CL Electronic Signature(s) Signed: 10/25/2015 8:46:39 AM By: Gwenlyn Perking Entered By: Gwenlyn Perking on 10/25/2015 08:46:39 Giebel, Dmiya H. ([id number]) -------------------------------------------------------------------------------- Lower Extremity Assessment Details Patient Name: Vieth, Milee H. Date of Service: 10/25/2015 8:00 AM Medical Record Patient Account Number: [id number] Number: Treating RN: Phillis Haggis [birth year] (59 y.o. Other Clinician: Date of Birth/Sex: Female) Treating ROBSON, MICHAEL Primary Care Physician/Extender: Comer Locket Physician: Referring Physician: Jones Broom in Treatment: 38 Edema Assessment Assessed: [Left: No] [Right: No] E[Left: dema] [Right: :] Calf Left: Right: Point of Measurement: cm From Medial Instep 45 cm 40.5 cm Ankle Left: Right: Point of Measurement: cm From Medial Instep 27.5 cm 25.2 cm Vascular Assessment Pulses: Posterior Tibial Dorsalis Pedis Palpable: [Left:Yes] [Right:Yes] Extremity colors, hair growth, and conditions: Extremity Color: [Left:Red] [Right:Red] Temperature of Extremity: [Left:Warm] [Right:Warm] Capillary Refill: [Left:< 3 seconds] [Right:< 3 seconds] Toe Nail Assessment Left: Right: Thick: No No Discolored: No No Deformed: No No Improper Length and Hygiene: No No Electronic Signature(s) Signed: 10/25/2015 4:40:51 PM By: Alejandro Mulling Entered By:  Alejandro Mulling on 10/25/2015 08:16:47 Moyano, Makell H. ([id number]) Woolford, Frimy H. ([id number]) -------------------------------------------------------------------------------- Multi Wound Chart Details Patient Name: Banker, Sadhana H. Date of Service: 10/25/2015 8:00 AM Medical Record Patient Account Number: [id number] Number: Treating RN: Phillis Haggis 1955-08-15 (59 y.o. Other Clinician: Date of Birth/Sex: Female) Treating ROBSON, MICHAEL Primary Care Physician/Extender: Comer Locket Physician: Referring Physician: Jones Broom in Treatment: 14 Vital Signs Height(in): 68 Pulse(bpm): 84 Weight(lbs):  294 Blood Pressure 136/57 (mmHg): Body Mass Index(BMI): 45 Temperature(F): 97.9 Respiratory Rate 20 (breaths/min): Photos: [3:No Photos] [8:No Photos] [N/A:N/A] Wound Location: [3:Left Lower Leg - Lateral] [8:Right Lower Leg - Posterior] [N/A:N/A] Wounding Event: [3:Gradually Appeared] [8:Gradually Appeared] [N/A:N/A] Primary Etiology: [3:Venous Leg Ulcer] [8:Venous Leg Ulcer] [N/A:N/A] Comorbid History: [3:Hypertension, Type II Diabetes] [8:Hypertension, Type II Diabetes] [N/A:N/A] Date Acquired: [3:01/09/2015] [8:03/27/2015] [N/A:N/A] Weeks of Treatment: [3:38] [8:29] [N/A:N/A] Wound Status: [3:Open] [8:Open] [N/A:N/A] Measurements L x W x D 0x0x0 [8:0x0x0] [N/A:N/A] (cm) Area (cm) : [3:0] [8:0] [N/A:N/A] Volume (cm) : [3:0] [8:0] [N/A:N/A] % Reduction in Area: [3:100.00%] [8:100.00%] [N/A:N/A] % Reduction in Volume: 100.00% [8:100.00%] [N/A:N/A] Classification: [3:Full Thickness Without Exposed Support Structures] [8:Full Thickness Without Exposed Support Structures] [N/A:N/A] HBO Classification: [3:Grade 1] [8:Grade 1] [N/A:N/A] Exudate Amount: [3:None Present] [8:None Present] [N/A:N/A] Wound Margin: [3:Flat and Intact] [8:Flat and Intact] [N/A:N/A] Granulation Amount: [3:None Present (0%)] [8:None Present (0%)]  [N/A:N/A] Necrotic Amount: [3:None Present (0%)] [8:None Present (0%)] [N/A:N/A] Exposed Structures: [3:Fascia: No Fat: No Tendon: No] [8:Fascia: No Fat: No Tendon: No] [N/A:N/A] Muscle: No Muscle: No Joint: No Joint: No Bone: No Bone: No Limited to Skin Limited to Skin Breakdown Breakdown Epithelialization: Large (67-100%) Large (67-100%) N/A Periwound Skin Texture: Scarring: Yes Scarring: Yes N/A Edema: No Edema: No Excoriation: No Excoriation: No Induration: No Induration: No Callus: No Callus: No Crepitus: No Crepitus: No Fluctuance: No Fluctuance: No Friable: No Friable: No Rash: No Rash: No Periwound Skin Maceration: No Maceration: No N/A Moisture: Moist: No Moist: No Dry/Scaly: No Dry/Scaly: No Periwound Skin Color: Atrophie Blanche: No Atrophie Blanche: No N/A Cyanosis: No Cyanosis: No Ecchymosis: No Ecchymosis: No Erythema: No Erythema: No Hemosiderin Staining: No Hemosiderin Staining: No Mottled: No Mottled: No Pallor: No Pallor: No Rubor: No Rubor: No Temperature: No Abnormality No Abnormality N/A Tenderness on Yes Yes N/A Palpation: Wound Preparation: Ulcer Cleansing: Other: Ulcer Cleansing: Other: N/A soap and water soap and water Topical Anesthetic Topical Anesthetic Applied: None Applied: None Treatment Notes Electronic Signature(s) Signed: 10/25/2015 4:40:51 PM By: Alejandro Mulling Entered By: Alejandro Mulling on 10/25/2015 08:35:49 Kowalski, Arvie Rexene Edison ([id number]) -------------------------------------------------------------------------------- Multi-Disciplinary Care Plan Details Patient Name: Mcfate, Tyrene H. Date of Service: 10/25/2015 8:00 AM Medical Record Patient Account Number: [id number] Number: Treating RN: Phillis Haggis [birth year] (59 y.o. Other Clinician: Date of Birth/Sex: Female) Treating ROBSON, MICHAEL Primary Care Physician/Extender: Comer Locket Physician: Referring Physician: Jones Broom in Treatment: 74 Active Inactive Electronic Signature(s) Signed: 10/25/2015 4:40:51 PM By: Alejandro Mulling Entered By: Alejandro Mulling on 10/25/2015 13:27:00 Delorenzo, Berniece H. ([id number]) -------------------------------------------------------------------------------- Pain Assessment Details Patient Name: Acri, Amma H. Date of Service: 10/25/2015 8:00 AM Medical Record Patient Account Number: [id number] Number: Treating RN: Phillis Haggis [birth year] (59 y.o. Other Clinician: Date of Birth/Sex: Female) Treating ROBSON, MICHAEL Primary Care Physician/Extender: Comer Locket Physician: Referring Physician: Jones Broom in Treatment: 68 Active Problems Location of Pain Severity and Description of Pain Patient Has Paino No Site Locations Pain Management and Medication Current Pain Management: Electronic Signature(s) Signed: 10/25/2015 4:40:51 PM By: Alejandro Mulling Entered By: Alejandro Mulling on 10/25/2015 08:08:32 Ivanoff, [name] ([id number]) -------------------------------------------------------------------------------- Patient/Caregiver Education Details Patient Name: Urich, Erinn H. Date of Service: 10/25/2015 8:00 AM Medical Record Patient Account Number: [id number] Number: Treating RN: Phillis Haggis 13-Jun-1956 (59 y.o. Other Clinician: Date of Birth/Gender: Female) Treating ROBSON, MICHAEL Primary Care Physician/Extender: Comer Locket Physician: Weeks in Treatment: 38 Referring Physician: Fidel Levy Education Assessment  Education Provided To: Patient Education Topics Provided Wound/Skin Impairment: Handouts: Other: Moisturize legs, wear compression stockings. Call us if you need anything Methods: Explain/Verbal Responses: State content correctly Electronic Signature(s) Signed: 10/25/2015 4:40:51 PM By: Alejandro MullingPinkerton, Debra Entered By: Alejandro MullingPinkerton, Debra on 10/25/2015  08:37:48 Kindler, Lakeasha H. ([id number]) -------------------------------------------------------------------------------- Wound Assessment Details Patient Name: Bonano, Camry H. Date of Service: 10/25/2015 8:00 AM Medical Record Patient Account Number: [id number] Number: Treating RN: Phillis Haggisinkerton, Debi 07-08-1956 (59 y.o. Other Clinician: Date of Birth/Sex: Female) Treating ROBSON, MICHAEL Primary Care Physician/Extender: Comer LocketG HAWKINS JR, JAMES Physician: Referring Physician: Fidel LevyHAWKINS JR, JAMES Weeks in Treatment: 38 Wound Status Wound Number: [id number] Primary Etiology: Venous Leg Ulcer Wound Location: Left Lower Leg - Lateral Wound Status: Open Wounding Event: Gradually Appeared Comorbid History: Hypertension, Type II Diabetes Date Acquired: 01/09/2015 Weeks Of Treatment: 38 Clustered Wound: No Photos Photo Uploaded By: Alejandro MullingPinkerton, Debra on 10/25/2015 16:31:23 Wound Measurements Length: (cm) 0 % Reduction i Width: (cm) 0 % Reduction i Depth: (cm) 0 Epithelializa Area: (cm) 0 Tunneling: Volume: (cm) 0 Undermining: n Area: 100% n Volume: 100% tion: Large (67-100%) No No Wound Description Full Thickness Without Classification: Exposed Support Structures Diabetic Severity Grade 1 (Wagner): Wound Margin: Flat and Intact Exudate Amount: None Present Foul Odor After Cleansing: No Wound Bed Granulation Amount: None Present (0%) Exposed Structure Gato, Jiayi H. ([id number]) Necrotic Amount: None Present (0%) Fascia Exposed: No Fat Layer Exposed: No Tendon Exposed: No Muscle Exposed: No Joint Exposed: No Bone Exposed: No Limited to Skin Breakdown Periwound Skin Texture Texture Color No Abnormalities Noted: No No Abnormalities Noted: No Callus: No Atrophie Blanche: No Crepitus: No Cyanosis: No Excoriation: No Ecchymosis: No Fluctuance: No Erythema: No Friable: No Hemosiderin Staining: No Induration: No Mottled: No Localized Edema: No Pallor:  No Rash: No Rubor: No Scarring: Yes Temperature / Pain Moisture Temperature: No Abnormality No Abnormalities Noted: No Tenderness on Palpation: Yes Dry / Scaly: No Maceration: No Moist: No Wound Preparation Ulcer Cleansing: Other: soap and water, Topical Anesthetic Applied: None Electronic Signature(s) Signed: 10/25/2015 4:40:51 PM By: Alejandro MullingPinkerton, Debra Entered By: Alejandro MullingPinkerton, Debra on 10/25/2015 08:34:38 Justen, Denim H. ([id number]) -------------------------------------------------------------------------------- Wound Assessment Details Patient Name: Baksh, Kaneisha H. Date of Service: 10/25/2015 8:00 AM Medical Record Patient Account Number: [id number] Number: Treating RN: Phillis Haggisinkerton, Debi 07-08-1956 (59 y.o. Other Clinician: Date of Birth/Sex: Female) Treating ROBSON, MICHAEL Primary Care Physician/Extender: Comer LocketG HAWKINS JR, JAMES Physician: Referring Physician: Fidel LevyHAWKINS JR, JAMES Weeks in Treatment: 38 Wound Status Wound Number: 8 Primary Etiology: Venous Leg Ulcer Wound Location: Right Lower Leg - Posterior Wound Status: Open Wounding Event: Gradually Appeared Comorbid History: Hypertension, Type II Diabetes Date Acquired: 03/27/2015 Weeks Of Treatment: 29 Clustered Wound: No Photos Photo Uploaded By: Alejandro MullingPinkerton, Debra on 10/25/2015 16:31:23 Wound Measurements Length: (cm) 0 % Reduction i Width: (cm) 0 % Reduction i Depth: (cm) 0 Epithelializa Area: (cm) 0 Tunneling: Volume: (cm) 0 Undermining: n Area: 100% n Volume: 100% tion: Large (67-100%) No No Wound Description Full Thickness Without Classification: Exposed Support Structures Diabetic Severity Grade 1 (Wagner): Wound Margin: Flat and Intact Exudate Amount: None Present Foul Odor After Cleansing: No Wound Bed Granulation Amount: None Present (0%) Exposed Structure Molesky, Daron H. ([id number]) Necrotic Amount: None Present (0%) Fascia Exposed: No Fat Layer Exposed: No Tendon  Exposed: No Muscle Exposed: No Joint Exposed: No Bone Exposed: No Limited to Skin Breakdown Periwound Skin Texture Texture Color No Abnormalities Noted: No No Abnormalities Noted: No Callus: No Atrophie Blanche: No Crepitus: No Cyanosis: No  Excoriation: No Ecchymosis: No Fluctuance: No Erythema: No Friable: No Hemosiderin Staining: No Induration: No Mottled: No Localized Edema: No Pallor: No Rash: No Rubor: No Scarring: Yes Temperature / Pain Moisture Temperature: No Abnormality No Abnormalities Noted: No Tenderness on Palpation: Yes Dry / Scaly: No Maceration: No Moist: No Wound Preparation Ulcer Cleansing: Other: soap and water, Topical Anesthetic Applied: None Electronic Signature(s) Signed: 10/25/2015 4:40:51 PM By: Alejandro Mulling Entered By: Alejandro Mulling on 10/25/2015 08:35:20 Pfeifer, Lonie Rexene Edison ([id number]) -------------------------------------------------------------------------------- Vitals Details Patient Name: Urbanczyk, Jaycey H. Date of Service: 10/25/2015 8:00 AM Medical Record Patient Account Number: [id number] Number: Treating RN: Phillis Haggis [birth year] (59 y.o. Other Clinician: Date of Birth/Sex: Female) Treating ROBSON, MICHAEL Primary Care Physician/Extender: Comer Locket Physician: Referring Physician: Jones Broom in Treatment: 56 Vital Signs Time Taken: 08:08 Temperature (F): 97.9 Height (in): 68 Pulse (bpm): 84 Weight (lbs): 294 Respiratory Rate (breaths/min): 20 Body Mass Index (BMI): 44.7 Blood Pressure (mmHg): 136/57 Reference Range: 80 - 120 mg / dl Electronic Signature(s) Signed: 10/25/2015 4:40:51 PM By: Alejandro Mulling Entered By: Alejandro Mulling on 10/25/2015 08:11:25

## 2015-12-19 ENCOUNTER — Ambulatory Visit (INDEPENDENT_AMBULATORY_CARE_PROVIDER_SITE_OTHER): Payer: BC Managed Care – PPO | Admitting: Family Medicine

## 2015-12-19 ENCOUNTER — Encounter: Payer: Self-pay | Admitting: Family Medicine

## 2015-12-19 VITALS — BP 108/66 | HR 94 | Temp 98.0°F | Resp 16 | Ht 66.0 in | Wt 253.0 lb

## 2015-12-19 DIAGNOSIS — N184 Chronic kidney disease, stage 4 (severe): Secondary | ICD-10-CM | POA: Diagnosis not present

## 2015-12-19 DIAGNOSIS — N186 End stage renal disease: Secondary | ICD-10-CM | POA: Diagnosis not present

## 2015-12-19 DIAGNOSIS — E119 Type 2 diabetes mellitus without complications: Secondary | ICD-10-CM

## 2015-12-19 DIAGNOSIS — I10 Essential (primary) hypertension: Secondary | ICD-10-CM

## 2015-12-19 DIAGNOSIS — Z794 Long term (current) use of insulin: Secondary | ICD-10-CM | POA: Diagnosis not present

## 2015-12-19 DIAGNOSIS — Z79899 Other long term (current) drug therapy: Secondary | ICD-10-CM | POA: Diagnosis not present

## 2015-12-19 DIAGNOSIS — E1122 Type 2 diabetes mellitus with diabetic chronic kidney disease: Secondary | ICD-10-CM | POA: Diagnosis not present

## 2015-12-19 DIAGNOSIS — E785 Hyperlipidemia, unspecified: Secondary | ICD-10-CM | POA: Diagnosis not present

## 2015-12-19 DIAGNOSIS — Z992 Dependence on renal dialysis: Secondary | ICD-10-CM | POA: Diagnosis not present

## 2015-12-19 LAB — POCT GLYCOSYLATED HEMOGLOBIN (HGB A1C): Hemoglobin A1C: 9.9

## 2015-12-19 NOTE — Progress Notes (Signed)
Name: [name]   MRN: [id number]    DOB: 1956-07-07   Date:12/19/2015       Progress Note  Subjective  Chief Complaint  Chief Complaint  Patient presents with  . Diabetes  . Hypertension    HPI Here for f/u of HBP and elevated heart rate.  She is diabetic, but sees Dr. Everitt Amber at [facility].  Has appt with her today.  No problem-specific assessment & plan notes found for this encounter.   Past Medical History  Diagnosis Date  . Chronic kidney disease   . Hypertension   . Diabetes mellitus without complication [facility])     Past Surgical History  Procedure Laterality Date  . Eye surgery  bil cataracts  . Av fistula placement Left 11/23/2014    Procedure: ARTERIOVENOUS (AV) FISTULA CREATION;  Surgeon: Annice Needy, MD;  Location: [facility] ORS;  Service: Vascular;  Laterality: Left;  . Peripheral vascular catheterization N/A 02/10/2015    Procedure: Dialysis/Perma Catheter Insertion;  Surgeon: Annice Needy, MD;  Location: [facility] INVASIVE CV LAB;  Service: Cardiovascular;  Laterality: N/A;  . Peripheral vascular catheterization N/A 03/13/2015    Procedure: A/V Shuntogram/Fistulagram;  Surgeon: Annice Needy, MD;  Location: [facility] INVASIVE CV LAB;  Service: Cardiovascular;  Laterality: N/A;  . Peripheral vascular catheterization N/A 03/13/2015    Procedure: A/V Shunt Intervention;  Surgeon: Annice Needy, MD;  Location: [facility] INVASIVE CV LAB;  Service: Cardiovascular;  Laterality: N/A;  . Peripheral vascular catheterization Left 04/27/2015    Procedure: A/V Shuntogram/Fistulagram;  Surgeon: Annice Needy, MD;  Location: [facility] INVASIVE CV LAB;  Service: Cardiovascular;  Laterality: Left;  . Peripheral vascular catheterization N/A 04/27/2015    Procedure: A/V Shunt Intervention;  Surgeon: Annice Needy, MD;  Location: [facility] INVASIVE CV LAB;  Service: Cardiovascular;  Laterality: N/A;  . Peripheral vascular catheterization N/A 05/11/2015    Procedure: Dialysis/Perma Catheter Removal;  Surgeon: Annice Needy, MD;  Location: [facility] INVASIVE CV LAB;  Service: Cardiovascular;  Laterality: N/A;    Family History  Problem Relation Age of Onset  . COPD Mother   . Cancer Father     Social History   Social History  . Marital Status: Widowed    Spouse Name: N/A  . Number of Children: N/A  . Years of Education: N/A   Occupational History  . Not on file.   Social History Main Topics  . Smoking status: Never Smoker   . Smokeless tobacco: Never Used  . Alcohol Use: No  . Drug Use: No  . Sexual Activity: No   Other Topics Concern  . Not on file   Social History Narrative     Current outpatient prescriptions:  .  carvedilol (COREG) 3.125 MG tablet, TAKE ONE TABLET BY MOUTH TWICE DAILY (Patient taking differently: TAKE ONE TABLET BY MOUTH TWICE DAILY on Tuesday, Thursday, Saturday, and Sunday), Disp: 60 tablet, Rfl: 6 .  carvedilol (COREG) 3.125 MG tablet, Take 3.125 mg by mouth every evening. On Monday, Wednesday, and Friday, Disp: , Rfl:  .  collagenase (SANTYL) ointment, Apply topically. Reported on 09/14/2015, Disp: , Rfl:  .  furosemide (LASIX) 80 MG tablet, Take 80 mg by mouth daily. On Tuesday, Thursday, Saturday, and Sunday, Disp: , Rfl:  .  insulin aspart (NOVOLOG) 100 UNIT/ML FlexPen, Inject 6-8 Units into the skin 3 (three) times daily with meals. , Disp: , Rfl:  .  Insulin Detemir (LEVEMIR FLEXPEN) 100 UNIT/ML Pen, Inject 36 Units into  the skin daily at 10 pm. , Disp: , Rfl:  .  Lancets (FREESTYLE) lancets, USE TO CHECK BLOOD SUGAR 4 TIMES DAILY, Disp: 200 each, Rfl: 12 .  lidocaine-prilocaine (EMLA) cream, Apply 1 application topically 3 (three) times a week. On Monday, Wednesday, and Friday, before dialysis, Disp: , Rfl:  .  SENSIPAR 30 MG tablet, Take 30 mg by mouth daily. Reported on 12/19/2015, Disp: , Rfl:   Allergies  Allergen Reactions  . Codeine Nausea Only    hydrocodone  . Vicodin [Hydrocodone-Acetaminophen] Nausea And Vomiting     Review of Systems   Constitutional: Negative for fever, chills, weight loss and malaise/fatigue.  HENT: Negative for hearing loss.   Eyes: Negative for blurred vision and double vision.  Respiratory: Negative for cough, shortness of breath and wheezing.   Cardiovascular: Negative for chest pain, palpitations and leg swelling.  Gastrointestinal: Negative for heartburn, abdominal pain and blood in stool.  Genitourinary: Negative for dysuria, urgency and frequency.  Musculoskeletal: Negative for myalgias and joint pain.  Skin: Negative for rash.  Neurological: Negative for dizziness, tremors, weakness and headaches.      Objective  Filed Vitals:   12/19/15 0818  BP: 108/66  Pulse: 94  Temp: 98 F (36.7 C)  TempSrc: Oral  Resp: 16  Height: 5\' 6"  (1.676 m)  Weight: 253 lb (114.76 kg)    Physical Exam  Constitutional: She is oriented to person, place, and time and well-developed, well-nourished, and in no distress. No distress.  HENT:  Head: Normocephalic and atraumatic.  Eyes: Conjunctivae and EOM are normal. Pupils are equal, round, and reactive to light. No scleral icterus.  Neck: Normal range of motion. Neck supple. No thyromegaly present.  Cardiovascular: Normal rate and regular rhythm.   Murmur heard.  Systolic murmur is present with a grade of 3/6  Throughout and radiating to L carotid  Pulmonary/Chest: Effort normal and breath sounds normal. No respiratory distress. She has no wheezes. She has no rales.  Musculoskeletal: She exhibits edema (trace bilateral pedal edema).  Lymphadenopathy:    She has no cervical adenopathy.  Neurological: She is alert and oriented to person, place, and time.       Recent Results (from the past 2160 hour(s))  POCT HgB A1C     Status: Abnormal   Collection Time: 12/19/15  8:33 AM  Result Value Ref Range   Hemoglobin A1C 9.9%      Assessment & Plan  Problem List Items Addressed This Visit      Cardiovascular and Mediastinum   Essential  (primary) hypertension     Endocrine   Diabetes mellitus, type 2 (HCC) - Primary   Relevant Orders   POCT HgB A1C (Completed)     Genitourinary   Chronic kidney disease (CKD), stage IV (severe) (HCC)      Meds ordered this encounter  Medications  . SENSIPAR 30 MG tablet    Sig: Take 30 mg by mouth daily. Reported on 12/19/2015    1. Type 2 diabetes mellitus without complication, without long-term current use of insulin (HCC) Cont insulins and f/u with Endocrine [facility]. - POCT HgB A1C-9.9  2. Essential (primary) hypertension Cont Coreg  3. Chronic kidney disease (CKD), stage IV (severe) (HCC) Cont Kidney cedter

## 2015-12-19 NOTE — Patient Instructions (Signed)
Discuss insulin change with Endocrine

## 2015-12-25 ENCOUNTER — Other Ambulatory Visit: Payer: Self-pay | Admitting: Vascular Surgery

## 2015-12-28 ENCOUNTER — Ambulatory Visit
Admission: RE | Admit: 2015-12-28 | Discharge: 2015-12-28 | Disposition: A | Discharge status: Home / Self Care | Payer: BC Managed Care – PPO | Source: Ambulatory Visit | Attending: Vascular Surgery | Admitting: Vascular Surgery

## 2015-12-28 ENCOUNTER — Encounter
Admission: RE | Disposition: A | Discharge status: Home / Self Care | Payer: Self-pay | Source: Ambulatory Visit | Attending: Vascular Surgery

## 2015-12-28 DIAGNOSIS — N184 Chronic kidney disease, stage 4 (severe): Secondary | ICD-10-CM | POA: Insufficient documentation

## 2015-12-28 DIAGNOSIS — Z992 Dependence on renal dialysis: Secondary | ICD-10-CM | POA: Insufficient documentation

## 2015-12-28 DIAGNOSIS — Z809 Family history of malignant neoplasm, unspecified: Secondary | ICD-10-CM | POA: Insufficient documentation

## 2015-12-28 DIAGNOSIS — Z836 Family history of other diseases of the respiratory system: Secondary | ICD-10-CM | POA: Insufficient documentation

## 2015-12-28 DIAGNOSIS — Z79899 Other long term (current) drug therapy: Secondary | ICD-10-CM | POA: Insufficient documentation

## 2015-12-28 DIAGNOSIS — E1122 Type 2 diabetes mellitus with diabetic chronic kidney disease: Secondary | ICD-10-CM | POA: Diagnosis not present

## 2015-12-28 DIAGNOSIS — Z794 Long term (current) use of insulin: Secondary | ICD-10-CM | POA: Insufficient documentation

## 2015-12-28 DIAGNOSIS — I129 Hypertensive chronic kidney disease with stage 1 through stage 4 chronic kidney disease, or unspecified chronic kidney disease: Secondary | ICD-10-CM | POA: Diagnosis not present

## 2015-12-28 DIAGNOSIS — Z885 Allergy status to narcotic agent status: Secondary | ICD-10-CM | POA: Diagnosis not present

## 2015-12-28 DIAGNOSIS — T82868A Thrombosis of vascular prosthetic devices, implants and grafts, initial encounter: Secondary | ICD-10-CM | POA: Insufficient documentation

## 2015-12-28 HISTORY — PX: PERIPHERAL VASCULAR CATHETERIZATION: SHX172C

## 2015-12-28 LAB — POTASSIUM (ARMC VASCULAR LAB ONLY): Potassium (ARMC vascular lab): 3.7 (ref 3.5–5.1)

## 2015-12-28 SURGERY — A/V SHUNTOGRAM/FISTULAGRAM
Anesthesia: Moderate Sedation | Site: Arm Upper | Laterality: Left

## 2015-12-28 MED ORDER — HEPARIN (PORCINE) IN NACL 2-0.9 UNIT/ML-% IJ SOLN
INTRAMUSCULAR | Status: AC
  Filled 2015-12-28: qty 1000

## 2015-12-28 MED ORDER — HEPARIN SODIUM (PORCINE) 1000 UNIT/ML IJ SOLN
INTRAMUSCULAR | Status: DC | PRN
  Administered 2015-12-28: 3000 [IU] via INTRAVENOUS

## 2015-12-28 MED ORDER — HYDROMORPHONE HCL 1 MG/ML IJ SOLN: 1.0000 mg | Freq: Once | INTRAMUSCULAR | Status: DC

## 2015-12-28 MED ORDER — FAMOTIDINE 20 MG PO TABS: 40.0000 mg | ORAL_TABLET | ORAL | Status: DC | PRN

## 2015-12-28 MED ORDER — LIDOCAINE-EPINEPHRINE (PF) 1 %-1:200000 IJ SOLN
INTRAMUSCULAR | Status: DC | PRN
  Administered 2015-12-28: 10 mL via INTRADERMAL

## 2015-12-28 MED ORDER — FENTANYL CITRATE (PF) 100 MCG/2ML IJ SOLN
INTRAMUSCULAR | Status: AC
  Filled 2015-12-28: qty 2

## 2015-12-28 MED ORDER — ALTEPLASE 2 MG IJ SOLR
INTRAMUSCULAR | Status: DC | PRN
  Administered 2015-12-28: 4 mg

## 2015-12-28 MED ORDER — ONDANSETRON HCL 4 MG/2ML IJ SOLN
INTRAMUSCULAR | Status: AC
  Administered 2015-12-28: 4 mg via INTRAVENOUS
  Filled 2015-12-28: qty 2

## 2015-12-28 MED ORDER — ONDANSETRON HCL 4 MG/2ML IJ SOLN
4.0000 mg | Freq: Four times a day (QID) | INTRAMUSCULAR | Status: DC | PRN
  Administered 2015-12-28: 4 mg via INTRAVENOUS

## 2015-12-28 MED ORDER — FENTANYL CITRATE (PF) 100 MCG/2ML IJ SOLN
INTRAMUSCULAR | Status: DC | PRN
  Administered 2015-12-28 (×4): 50 ug via INTRAVENOUS

## 2015-12-28 MED ORDER — METHYLPREDNISOLONE SODIUM SUCC 125 MG IJ SOLR: 125.0000 mg | INTRAMUSCULAR | Status: DC | PRN

## 2015-12-28 MED ORDER — MIDAZOLAM HCL 2 MG/2ML IJ SOLN
INTRAMUSCULAR | Status: DC | PRN
  Administered 2015-12-28: 1 mg via INTRAVENOUS
  Administered 2015-12-28: 2 mg via INTRAVENOUS
  Administered 2015-12-28: 1 mg via INTRAVENOUS

## 2015-12-28 MED ORDER — IOPAMIDOL (ISOVUE-300) INJECTION 61%
INTRAVENOUS | Status: DC | PRN
  Administered 2015-12-28: 40 mL via INTRA_ARTERIAL

## 2015-12-28 MED ORDER — MIDAZOLAM HCL 5 MG/5ML IJ SOLN
INTRAMUSCULAR | Status: AC
  Filled 2015-12-28: qty 5

## 2015-12-28 MED ORDER — SODIUM CHLORIDE 0.9 % IV SOLN: INTRAVENOUS | Status: DC

## 2015-12-28 MED ORDER — CEFUROXIME SODIUM 1.5 G IJ SOLR
1.5000 g | INTRAMUSCULAR | Status: AC
  Administered 2015-12-28: 1.5 g via INTRAVENOUS

## 2015-12-28 MED ORDER — HEPARIN SODIUM (PORCINE) 1000 UNIT/ML IJ SOLN
INTRAMUSCULAR | Status: AC
  Filled 2015-12-28: qty 1

## 2015-12-28 MED ORDER — ALTEPLASE 2 MG IJ SOLR
INTRAMUSCULAR | Status: AC
  Filled 2015-12-28: qty 4

## 2015-12-28 MED ORDER — LIDOCAINE-EPINEPHRINE (PF) 1 %-1:200000 IJ SOLN
INTRAMUSCULAR | Status: AC
  Filled 2015-12-28: qty 30

## 2015-12-28 SURGICAL SUPPLY — 20 items
BALLN ARMADA 7X80X80 (BALLOONS) ×3
BALLN DORADO 7X200X80 (BALLOONS) ×3
BALLOON ARMADA 7X80X80 (BALLOONS) ×1 IMPLANT
BALLOON DORADO 7X200X80 (BALLOONS) ×1 IMPLANT
CANNULA 5F STIFF (CANNULA) ×3 IMPLANT
CATH KA2 5FR 65CM (CATHETERS) ×3 IMPLANT
CATH TORCON 5FR 0.38 (CATHETERS) ×3 IMPLANT
DEVICE PRESTO INFLATION (MISCELLANEOUS) ×3 IMPLANT
DRAPE BRACHIAL (DRAPES) ×3 IMPLANT
GLIDEWIRE ADV .035X180CM (WIRE) ×3 IMPLANT
PACK ANGIOGRAPHY (CUSTOM PROCEDURE TRAY) ×3 IMPLANT
SET AVX THROMB ULT (MISCELLANEOUS) ×3 IMPLANT
SHEATH BRITE TIP 6FRX5.5 (SHEATH) ×3 IMPLANT
SHEATH BRITE TIP 7FRX5.5 (SHEATH) ×6 IMPLANT
STENT VIABAHN 8X150X120 (Permanent Stent) ×2 IMPLANT
STENT VIABAHN 8X15X120 7FR (Permanent Stent) ×1 IMPLANT
STENT VIABAHN 8X250X120 (Permanent Stent) ×3 IMPLANT
TOWEL OR 17X26 4PK STRL BLUE (TOWEL DISPOSABLE) ×3 IMPLANT
WIRE G V18X300CM (WIRE) ×3 IMPLANT
WIRE MAGIC TORQUE 260C (WIRE) ×3 IMPLANT

## 2015-12-28 NOTE — H&P (Signed)
   [facility] History & Physical Update  The patient was interviewed and re-examined.  The patient's previous History and Physical has been reviewed and is unchanged.  There is no change in the plan of care. We plan to proceed with the scheduled procedure.  DEW,JASON, MD  12/28/2015, 10:25 AM

## 2015-12-28 NOTE — Op Note (Signed)
[facility]    OPERATIVE NOTE   PROCEDURE: 1.  Left brachiocephalic arteriovenous fistula cannulation under ultrasound guidance in an antegrade fashion 2.  Left arm fistulagram and central venogram 3.  Catheter directed thrombolysis with 4 mg of TPA delivered with the AngioJet AVX catheter 4.  Mechanical rheolytic thrombectomy to the left brachiocephalic AV fistula with the AngioJet AVX catheter 5.  Percutaneous transluminal angioplasty of the proximal and mid forearm cephalic vein with 7 mm diameter high pressure angioplasty balloon 6.  Percutaneous transluminal angioplasty of the cephalic vein subclavian vein confluence and subclavian vein with 7 mm diameter high pressure angioplasty balloon 7.  Viabahn covered stent placement to the cephalic vein subclavian vein confluence and proximal arm cephalic vein with 83m diameter by 25 cm length covered stent 8.  Viabahn covered stent placement to the mid to distal upper arm cephalic vein with 829mdiameter by 15 cm length covered stent  PRE-OPERATIVE DIAGNOSIS: 1. ESRD 2.  Dysfunctional left brachiocephalic AV fistula  POST-OPERATIVE DIAGNOSIS: 1. ESRD 2.  Thrombosed left brachiocephalic AV fistula  SURGEON: JaLeotis PainMD  ANESTHESIA: local with Moderate Conscious Sedation for 45 minutes using 4 mg of Versed and 200 mcg of Fentanyl  ESTIMATED BLOOD LOSS: 25 cc  FINDING(S): 1. 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  The patient is scheduled for a fistulogram with intervention.  The patient is aware the risks include but are not limited to: bleeding, infection,  thrombosis of the cannulated access, and possible anaphylactic reaction to the contrast.  The patient is aware of the risks of the procedure and elects to proceed forward.  DESCRIPTION: After full informed written consent was obtained, the patient was brought back to the angiography suite and placed supine upon the angiography table.  The patient was connected to monitoring equipment. Moderate conscious sedation was administered during a face to face encounter with the patient throughout the procedure with my supervision of the RN administering medicines and monitoring the patient's vital signs, pulse oximetry, telemetry and mental status throughout from the start of the procedure until the patient was taken to the recovery room. The  left arm was prepped and draped in the standard fashion for a percutaneous access intervention.  Under ultrasound guidance, the aneurysmal arterial access site of the left brachiocephalic arteriovenous fistula was cannulated with a micropuncture needle under direct ultrasound guidance in an antegrade fashion and a permanent image was recorded. I then upsized to a 6 Fr Sheath and imaging was performed.  Hand injections were completed to image the access including the central venous system. This demonstrated thrombosis of the AVF in the mid upper arm cephalic vein which was not known as they have been accessing the aneurysmal site and just getting poor flows. The initial 8-10 cm of the fistula were patent but aneurysmal after the brachiocephalic anastomosis.  Based on the images, this patient will need extensive treatment to salvage the graft. I then gave the patient 3000 units of intravenous heparin.  I then placed a Magic torque wire into the cephalic vein but had to exchange for an advantage wire and a Kumpe catheter to navigate through the occlusion. There  was clearly a very high-grade stenosis versus occlusion of the cephalic vein subclavian vein confluence. I was able to cross  this with mild difficulty and confirm intraluminal flow in the subclavian vein with a Kumpe catheter. The Magic torque wire was then replaced. 4 mg of TPA were then deployed from the mid to distal upper arm cephalic vein all the way through the remainder of the cephalic vein into the subclavian vein to encompass the area of thrombosis. Angioplasty was then performed with a 7 mm diameter by 20 cm length high pressure angioplasty balloon first in the mid to proximal upper arm cephalic vein. This was inflated to 16 atm for 1 minute. I then took this balloon all the way into the subclavian vein to get the cephalic vein subclavian vein confluence and most proximal cephalic vein. This had to be inflated to 32 atm to break the waist. Inflation was held for 1 minute. I then upsized to a 7 [place] sheath as there was still high-grade residual stenosis at the cephalic vein subclavian vein confluence and a significant amount of residual thrombus throughout the upper arm cephalic vein. Mechanical rheolytic thrombectomy was then performed with the AngioJet AVX catheter. Minimal improvement seen. I then exchanged for a 0.018 wire and elected to place covered stents. The first covered stent was placed with its most proximal stent about a centimeter into the subclavian vein and encompassing the cephalic vein subclavian vein confluence and the proximal arm cephalic vein. It was an 8 mm diameter by 25 cm length Viabahn stent. There was still stenosis and thrombus seen in the mid to distal upper arm cephalic vein and saw covered this area with an 8 mm diameter by 15 cm length Viabahn stent. These were postdilated with a 7 mm balloon with excellent angiographic completion result and no significant residual stenosis of more than 20% after the interventions.   Based on the completion imaging, no further intervention is necessary.  The wire and balloon were removed from the sheath.  A 4-0 Monocryl purse-string suture was sewn around the  sheath.  The sheath was removed while tying down the suture.  A sterile bandage was applied to the puncture site.  COMPLICATIONS: None  CONDITION: Stable   DEW,JASON 12/28/2015 4:00 PM

## 2015-12-28 NOTE — Discharge Instructions (Signed)
Fistulogram, Care After °Refer to this sheet in the next few weeks. These instructions provide you with information on caring for yourself after your procedure. Your health care provider may also give you more specific instructions. Your treatment has been planned according to current medical practices, but problems sometimes occur. Call your health care provider if you have any problems or questions after your procedure. °WHAT TO EXPECT AFTER THE PROCEDURE °After your procedure, it is typical to have the following: °· A small amount of discomfort in the area where the catheters were placed. °· A small amount of bruising around the fistula. °· Sleepiness and fatigue. °HOME CARE INSTRUCTIONS °· Rest at home for the day following your procedure. °· Do not drive or operate heavy machinery while taking pain medicine. °· Take medicines only as directed by your health care provider. °· Do not take baths, swim, or use a hot tub until your health care provider approves. You may shower 24 hours after the procedure or as directed by your health care provider. °· There are many different ways to close and cover an incision, including stitches, skin glue, and adhesive strips. Follow your health care provider's instructions on: °¨ Incision care. °¨ Bandage (dressing) changes and removal. °¨ Incision closure removal. °· Monitor your dialysis fistula carefully. °SEEK MEDICAL CARE IF: °· You have drainage, redness, swelling, or pain at your catheter site. °· You have a fever. °· You have chills. °SEEK IMMEDIATE MEDICAL CARE IF: °· You feel weak. °· You have trouble balancing. °· You have trouble moving your arms or legs. °· You have problems with your speech or vision. °· You can no longer feel a vibration or buzz when you put your fingers over your dialysis fistula. °· The limb that was used for the procedure: °¨ Swells. °¨ Is painful. °¨ Is cold. °¨ Is discolored, such as blue or pale white. °  °This information is not intended  to replace advice given to you by your health care provider. Make sure you discuss any questions you have with your health care provider. °  °Document Released: 11/15/2013 Document Reviewed: 11/15/2013 °Elsevier Interactive Patient Education ©2016 [employer] ° °

## 2015-12-29 ENCOUNTER — Encounter: Payer: Self-pay | Admitting: Vascular Surgery

## 2016-02-06 DIAGNOSIS — L97922 Non-pressure chronic ulcer of unspecified part of left lower leg with fat layer exposed: Secondary | ICD-10-CM | POA: Diagnosis not present

## 2016-02-06 DIAGNOSIS — L97912 Non-pressure chronic ulcer of unspecified part of right lower leg with fat layer exposed: Secondary | ICD-10-CM | POA: Diagnosis not present

## 2016-02-06 DIAGNOSIS — N186 End stage renal disease: Secondary | ICD-10-CM | POA: Diagnosis not present

## 2016-02-06 DIAGNOSIS — T82858A Stenosis of vascular prosthetic devices, implants and grafts, initial encounter: Secondary | ICD-10-CM | POA: Diagnosis not present

## 2016-02-06 DIAGNOSIS — Y841 Kidney dialysis as the cause of abnormal reaction of the patient, or of later complication, without mention of misadventure at the time of the procedure: Secondary | ICD-10-CM | POA: Diagnosis not present

## 2016-02-06 DIAGNOSIS — E119 Type 2 diabetes mellitus without complications: Secondary | ICD-10-CM | POA: Diagnosis not present

## 2016-02-06 DIAGNOSIS — I890 Lymphedema, not elsewhere classified: Secondary | ICD-10-CM | POA: Diagnosis not present

## 2016-02-06 DIAGNOSIS — M7989 Other specified soft tissue disorders: Secondary | ICD-10-CM | POA: Diagnosis not present

## 2016-02-13 DIAGNOSIS — Z992 Dependence on renal dialysis: Secondary | ICD-10-CM | POA: Diagnosis not present

## 2016-02-13 DIAGNOSIS — E1122 Type 2 diabetes mellitus with diabetic chronic kidney disease: Secondary | ICD-10-CM | POA: Diagnosis not present

## 2016-02-13 DIAGNOSIS — Z79899 Other long term (current) drug therapy: Secondary | ICD-10-CM | POA: Diagnosis not present

## 2016-02-13 DIAGNOSIS — N186 End stage renal disease: Secondary | ICD-10-CM | POA: Diagnosis not present

## 2016-02-13 DIAGNOSIS — E785 Hyperlipidemia, unspecified: Secondary | ICD-10-CM | POA: Diagnosis not present

## 2016-02-13 DIAGNOSIS — Z794 Long term (current) use of insulin: Secondary | ICD-10-CM | POA: Diagnosis not present

## 2016-04-02 ENCOUNTER — Encounter: Payer: Self-pay | Admitting: Family Medicine

## 2016-04-02 ENCOUNTER — Ambulatory Visit (INDEPENDENT_AMBULATORY_CARE_PROVIDER_SITE_OTHER): Payer: BC Managed Care – PPO | Admitting: Family Medicine

## 2016-04-02 VITALS — BP 123/67 | HR 82 | Temp 98.2°F | Ht 68.0 in | Wt 256.4 lb

## 2016-04-02 DIAGNOSIS — E119 Type 2 diabetes mellitus without complications: Secondary | ICD-10-CM | POA: Diagnosis not present

## 2016-04-02 DIAGNOSIS — N184 Chronic kidney disease, stage 4 (severe): Secondary | ICD-10-CM

## 2016-04-02 DIAGNOSIS — I10 Essential (primary) hypertension: Secondary | ICD-10-CM

## 2016-04-02 DIAGNOSIS — E1122 Type 2 diabetes mellitus with diabetic chronic kidney disease: Secondary | ICD-10-CM | POA: Diagnosis not present

## 2016-04-02 DIAGNOSIS — M1990 Unspecified osteoarthritis, unspecified site: Secondary | ICD-10-CM | POA: Insufficient documentation

## 2016-04-02 LAB — POCT GLYCOSYLATED HEMOGLOBIN (HGB A1C)

## 2016-04-02 MED ORDER — TRAMADOL HCL 50 MG PO TABS: 50.0000 mg | ORAL_TABLET | Freq: Four times a day (QID) | ORAL | 5 refills | Status: AC | PRN

## 2016-04-02 NOTE — Patient Instructions (Signed)
Call if BSs drop too much with change of insulin dose.  She has already had her flu shot for this year.

## 2016-04-02 NOTE — Progress Notes (Signed)
Name: [name]   MRN: [id number]    DOB: 01/14/1956   Date:04/02/2016       Progress Note  Subjective  Chief Complaint  Chief Complaint  Patient presents with  . Diabetes    Follow up     HPI Here for f/u of DM.  She has seen her Endo with last 3 months, but no changes made in insulin therapy.  Dialysis going ok.  She c/o some early arthritic pain in fingers of both hands.  BSs at home from high 100s to 300.  Last A1c was 9.9.  A1c today is 9.5.   She is moving to [employer] in 1 month. No problem-specific Assessment & Plan notes found for this encounter.   Past Medical History:  Diagnosis Date  . Chronic kidney disease   . Diabetes mellitus without complication (HCC)   . Hypertension     Past Surgical History:  Procedure Laterality Date  . AV FISTULA PLACEMENT Left 11/23/2014   Procedure: ARTERIOVENOUS (AV) FISTULA CREATION;  Surgeon: Annice Needy, MD;  Location: [facility] ORS;  Service: Vascular;  Laterality: Left;  . EYE SURGERY  bil cataracts  . PERIPHERAL VASCULAR CATHETERIZATION N/A 02/10/2015   Procedure: Dialysis/Perma Catheter Insertion;  Surgeon: Annice Needy, MD;  Location: [facility] INVASIVE CV LAB;  Service: Cardiovascular;  Laterality: N/A;  . PERIPHERAL VASCULAR CATHETERIZATION N/A 03/13/2015   Procedure: A/V Shuntogram/Fistulagram;  Surgeon: Annice Needy, MD;  Location: [facility] INVASIVE CV LAB;  Service: Cardiovascular;  Laterality: N/A;  . PERIPHERAL VASCULAR CATHETERIZATION N/A 03/13/2015   Procedure: A/V Shunt Intervention;  Surgeon: Annice Needy, MD;  Location: [facility] INVASIVE CV LAB;  Service: Cardiovascular;  Laterality: N/A;  . PERIPHERAL VASCULAR CATHETERIZATION Left 04/27/2015   Procedure: A/V Shuntogram/Fistulagram;  Surgeon: Annice Needy, MD;  Location: [facility] INVASIVE CV LAB;  Service: Cardiovascular;  Laterality: Left;  . PERIPHERAL VASCULAR CATHETERIZATION N/A 04/27/2015   Procedure: A/V Shunt Intervention;  Surgeon: Annice Needy, MD;  Location: [facility] INVASIVE CV  LAB;  Service: Cardiovascular;  Laterality: N/A;  . PERIPHERAL VASCULAR CATHETERIZATION N/A 05/11/2015   Procedure: Dialysis/Perma Catheter Removal;  Surgeon: Annice Needy, MD;  Location: [facility] INVASIVE CV LAB;  Service: Cardiovascular;  Laterality: N/A;  . PERIPHERAL VASCULAR CATHETERIZATION Left 12/28/2015   Procedure: A/V Shuntogram/Fistulagram;  Surgeon: Annice Needy, MD;  Location: [facility] INVASIVE CV LAB;  Service: Cardiovascular;  Laterality: Left;    Family History  Problem Relation Age of Onset  . COPD Mother   . Cancer Father     Social History   Social History  . Marital status: Widowed    Spouse name: N/A  . Number of children: N/A  . Years of education: N/A   Occupational History  . Not on file.   Social History Main Topics  . Smoking status: Never Smoker  . Smokeless tobacco: Never Used  . Alcohol use No  . Drug use: No  . Sexual activity: No   Other Topics Concern  . Not on file   Social History Narrative  . No narrative on file     Current Outpatient Prescriptions:  .  carvedilol (COREG) 3.125 MG tablet, TAKE ONE TABLET BY MOUTH TWICE DAILY (Patient taking differently: TAKE ONE TABLET BY MOUTH TWICE DAILY on Tuesday, Thursday, Saturday, and Sunday), Disp: 60 tablet, Rfl: 6 .  furosemide (LASIX) 80 MG tablet, Take 80 mg by mouth daily. On Tuesday, Thursday, Saturday, and Sunday, Disp: , Rfl:  .  insulin aspart (  NOVOLOG) 100 UNIT/ML FlexPen, Inject 16-18 Units into the skin 3 (three) times daily with meals., Disp: , Rfl:  .  Insulin Detemir (LEVEMIR FLEXPEN) 100 UNIT/ML Pen, Inject 44 Units into the skin daily at 10 pm., Disp: , Rfl:  .  Lancets (FREESTYLE) lancets, USE TO CHECK BLOOD SUGAR 4 TIMES DAILY, Disp: 200 each, Rfl: 12 .  lidocaine-prilocaine (EMLA) cream, Apply 1 application topically 3 (three) times a week. On Monday, Wednesday, and Friday, before dialysis, Disp: , Rfl:  .  SENSIPAR 30 MG tablet, Take 30 mg by mouth daily. Reported on 12/19/2015, Disp: ,  Rfl:  .  traMADol (ULTRAM) 50 MG tablet, Take 1 tablet (50 mg total) by mouth every 6 (six) hours as needed for moderate pain., Disp: 120 tablet, Rfl: 5  Allergies  Allergen Reactions  . Codeine Nausea Only    hydrocodone  . Vicodin [Hydrocodone-Acetaminophen] Nausea And Vomiting     Review of Systems  Constitutional: Negative for chills, fever, malaise/fatigue and weight loss.  HENT: Negative for hearing loss.   Eyes: Negative for blurred vision and double vision.  Respiratory: Negative for cough, shortness of breath and wheezing.   Cardiovascular: Positive for leg swelling (controlled with Lasix). Negative for chest pain and palpitations.  Gastrointestinal: Negative for abdominal pain, blood in stool and heartburn.  Genitourinary: Negative for dysuria, frequency and urgency.  Musculoskeletal: Positive for joint pain (hand / finger joint pain). Negative for myalgias.  Skin: Negative for rash.       All diabetic ulcers have healed.  Neurological: Negative for dizziness, tremors, weakness and headaches.      Objective  Vitals:   04/02/16 0827  BP: 123/67  Pulse: 82  Temp: 98.2 F (36.8 C)  TempSrc: Oral  Weight: 256 lb 6.4 oz (116.3 kg)  Height: 5\' 8"  (1.727 m)    Physical Exam  Constitutional: She is oriented to person, place, and time and well-developed, well-nourished, and in no distress. No distress.  HENT:  Head: Normocephalic and atraumatic.  Eyes: Conjunctivae and EOM are normal. Pupils are equal, round, and reactive to light. No scleral icterus.  Neck: Normal range of motion. Neck supple. Carotid bruit is not present. No thyromegaly present.  Cardiovascular: Normal rate and regular rhythm.  Exam reveals no gallop and no friction rub.   Murmur heard.  Systolic murmur is present with a grade of 3/6  Throughout.  Pulmonary/Chest: Effort normal and breath sounds normal. No respiratory distress. She has no wheezes. She has no rales.  Abdominal: Soft. Bowel sounds  are normal. She exhibits no distension and no mass. There is no tenderness.  Musculoskeletal: She exhibits edema (trace bilateral pedal edema.).  Lymphadenopathy:    She has no cervical adenopathy.  Neurological: She is alert and oriented to person, place, and time.  Skin: Skin is warm and dry.  Vitals reviewed.      Recent Results (from the past 2160 hour(s))  POCT HgB A1C     Status: Abnormal   Collection Time: 04/02/16  8:42 AM  Result Value Ref Range   Hemoglobin A1C 9.5%      Assessment & Plan  Problem List Items Addressed This Visit      Cardiovascular and Mediastinum   Essential (primary) hypertension     Endocrine   Diabetes mellitus, type 2 (HCC)     Musculoskeletal and Integument   Arthritis   Relevant Medications   traMADol (ULTRAM) 50 MG tablet     Genitourinary   Chronic kidney  disease (CKD), stage IV (severe) (HCC)    Other Visit Diagnoses    Diabetes mellitus without complication (HCC)    -  Primary   Relevant Orders   POCT HgB A1C (Completed)      Meds ordered this encounter  Medications  . traMADol (ULTRAM) 50 MG tablet    Sig: Take 1 tablet (50 mg total) by mouth every 6 (six) hours as needed for moderate pain.    Dispense:  120 tablet    Refill:  5   1. Diabetes mellitus without complication (HCC)  - POCT HgB A1C-9.5.  2. Type 2 diabetes mellitus with diabetic chronic kidney disease, unspecified CKD stage, unspecified long term insulin use status (HCC) Change Levemir and Novolog as directed.  3. Essential (primary) hypertension Cont meds  4. Chronic kidney disease (CKD), stage IV (severe) (HCC)  Cont Nephrology 5. Arthritis  - traMADol (ULTRAM) 50 MG tablet; Take 1 tablet (50 mg total) by mouth every 6 (six) hours as needed for moderate pain.  Dispense: 120 tablet; Refill: 5

## 2016-04-08 ENCOUNTER — Other Ambulatory Visit: Payer: Self-pay | Admitting: Family Medicine

## 2016-04-15 ENCOUNTER — Other Ambulatory Visit: Payer: Self-pay | Admitting: Family Medicine

## 2016-06-09 ENCOUNTER — Other Ambulatory Visit: Payer: Self-pay | Admitting: Family Medicine

## 2016-06-12 ENCOUNTER — Other Ambulatory Visit: Payer: Self-pay | Admitting: Family Medicine

## 2016-06-12 DIAGNOSIS — E119 Type 2 diabetes mellitus without complications: Secondary | ICD-10-CM

## 2016-06-18 DIAGNOSIS — N186 End stage renal disease: Secondary | ICD-10-CM | POA: Diagnosis not present

## 2016-06-18 DIAGNOSIS — Z992 Dependence on renal dialysis: Secondary | ICD-10-CM | POA: Diagnosis not present

## 2016-07-21 ENCOUNTER — Other Ambulatory Visit: Payer: Self-pay | Admitting: Family Medicine

## 2016-08-08 ENCOUNTER — Encounter (INDEPENDENT_AMBULATORY_CARE_PROVIDER_SITE_OTHER): Payer: Self-pay

## 2016-08-08 ENCOUNTER — Ambulatory Visit (INDEPENDENT_AMBULATORY_CARE_PROVIDER_SITE_OTHER): Payer: Self-pay | Admitting: Vascular Surgery

## 2016-08-14 DIAGNOSIS — Z992 Dependence on renal dialysis: Secondary | ICD-10-CM | POA: Diagnosis not present

## 2016-08-14 DIAGNOSIS — E1122 Type 2 diabetes mellitus with diabetic chronic kidney disease: Secondary | ICD-10-CM | POA: Diagnosis not present

## 2016-08-14 DIAGNOSIS — N186 End stage renal disease: Secondary | ICD-10-CM | POA: Diagnosis not present

## 2016-08-20 DIAGNOSIS — M2550 Pain in unspecified joint: Secondary | ICD-10-CM | POA: Diagnosis not present

## 2016-08-20 DIAGNOSIS — M19049 Primary osteoarthritis, unspecified hand: Secondary | ICD-10-CM | POA: Diagnosis not present

## 2016-08-20 DIAGNOSIS — I10 Essential (primary) hypertension: Secondary | ICD-10-CM | POA: Diagnosis not present

## 2016-08-20 DIAGNOSIS — E1165 Type 2 diabetes mellitus with hyperglycemia: Secondary | ICD-10-CM | POA: Diagnosis not present

## 2016-08-30 DIAGNOSIS — T82868A Thrombosis of vascular prosthetic devices, implants and grafts, initial encounter: Secondary | ICD-10-CM | POA: Diagnosis not present

## 2016-08-30 DIAGNOSIS — I120 Hypertensive chronic kidney disease with stage 5 chronic kidney disease or end stage renal disease: Secondary | ICD-10-CM | POA: Diagnosis not present

## 2016-08-30 DIAGNOSIS — E1122 Type 2 diabetes mellitus with diabetic chronic kidney disease: Secondary | ICD-10-CM | POA: Diagnosis not present

## 2016-08-30 DIAGNOSIS — N186 End stage renal disease: Secondary | ICD-10-CM | POA: Diagnosis not present

## 2016-09-21 ENCOUNTER — Other Ambulatory Visit: Payer: Self-pay | Admitting: Family Medicine

## 2016-09-21 DIAGNOSIS — E119 Type 2 diabetes mellitus without complications: Secondary | ICD-10-CM

## 2016-10-28 DIAGNOSIS — H26491 Other secondary cataract, right eye: Secondary | ICD-10-CM | POA: Diagnosis not present

## 2016-11-02 ENCOUNTER — Other Ambulatory Visit: Payer: Self-pay | Admitting: Family Medicine

## 2016-11-02 DIAGNOSIS — E119 Type 2 diabetes mellitus without complications: Secondary | ICD-10-CM

## 2016-11-03 NOTE — Telephone Encounter (Signed)
Refill provided for one pen.  Patient has not been seen in over 6 months.  Pt needs to have an office visit before any additional refills on any medication for any of her chronic conditions.

## 2018-11-13 DEATH — deceased
